# Patient Record
Sex: Male | Born: 1960 | Race: Black or African American | Hispanic: No | Marital: Married | Smoking: Former smoker
Health system: Southern US, Community
[De-identification: ages and names within clinical notes are randomized; demographics above are authoritative.]

## PROBLEM LIST (undated history)

## (undated) DIAGNOSIS — F419 Anxiety disorder, unspecified: Secondary | ICD-10-CM

## (undated) DIAGNOSIS — I10 Essential (primary) hypertension: Secondary | ICD-10-CM

## (undated) DIAGNOSIS — D849 Immunodeficiency, unspecified: Secondary | ICD-10-CM

## (undated) DIAGNOSIS — N289 Disorder of kidney and ureter, unspecified: Secondary | ICD-10-CM

## (undated) DIAGNOSIS — B1910 Unspecified viral hepatitis B without hepatic coma: Secondary | ICD-10-CM

## (undated) DIAGNOSIS — B20 Human immunodeficiency virus [HIV] disease: Secondary | ICD-10-CM

## (undated) DIAGNOSIS — L03113 Cellulitis of right upper limb: Secondary | ICD-10-CM

## (undated) DIAGNOSIS — D649 Anemia, unspecified: Secondary | ICD-10-CM

## (undated) HISTORY — PX: HERNIA REPAIR: SHX51

## (undated) HISTORY — DX: Anxiety disorder, unspecified: F41.9

## (undated) HISTORY — DX: Anemia, unspecified: D64.9

---

## 1999-06-13 ENCOUNTER — Emergency Department (HOSPITAL_COMMUNITY): Admission: EM | Admit: 1999-06-13 | Discharge: 1999-06-13 | Payer: Self-pay

## 1999-06-14 ENCOUNTER — Encounter: Payer: Self-pay | Admitting: Emergency Medicine

## 2000-12-11 ENCOUNTER — Encounter: Payer: Self-pay | Admitting: Family Medicine

## 2000-12-11 ENCOUNTER — Ambulatory Visit (HOSPITAL_COMMUNITY): Admission: RE | Admit: 2000-12-11 | Discharge: 2000-12-11 | Payer: Self-pay | Admitting: Family Medicine

## 2003-08-25 ENCOUNTER — Emergency Department (HOSPITAL_COMMUNITY): Admission: EM | Admit: 2003-08-25 | Discharge: 2003-08-25 | Payer: Self-pay | Admitting: Emergency Medicine

## 2003-08-25 ENCOUNTER — Encounter: Payer: Self-pay | Admitting: Emergency Medicine

## 2005-01-22 ENCOUNTER — Ambulatory Visit: Payer: Self-pay | Admitting: Infectious Diseases

## 2005-01-22 ENCOUNTER — Encounter (INDEPENDENT_AMBULATORY_CARE_PROVIDER_SITE_OTHER): Payer: Self-pay | Admitting: *Deleted

## 2005-01-22 ENCOUNTER — Ambulatory Visit (HOSPITAL_COMMUNITY): Admission: RE | Admit: 2005-01-22 | Discharge: 2005-01-22 | Payer: Self-pay | Admitting: Infectious Diseases

## 2005-01-22 LAB — CONVERTED CEMR LAB
CD4 Count: 620 microliters
CD4 T Cell Abs: 620
HIV 1 RNA Quant: 9030 copies/mL

## 2005-03-06 ENCOUNTER — Ambulatory Visit: Payer: Self-pay | Admitting: Infectious Diseases

## 2005-05-22 ENCOUNTER — Ambulatory Visit: Payer: Self-pay | Admitting: Infectious Diseases

## 2005-05-22 ENCOUNTER — Encounter (INDEPENDENT_AMBULATORY_CARE_PROVIDER_SITE_OTHER): Payer: Self-pay | Admitting: *Deleted

## 2005-05-22 ENCOUNTER — Ambulatory Visit (HOSPITAL_COMMUNITY): Admission: RE | Admit: 2005-05-22 | Discharge: 2005-05-22 | Payer: Self-pay | Admitting: Infectious Diseases

## 2005-05-22 LAB — CONVERTED CEMR LAB: CD4 Count: 810 microliters

## 2005-06-05 ENCOUNTER — Ambulatory Visit: Payer: Self-pay | Admitting: Infectious Diseases

## 2007-01-11 ENCOUNTER — Encounter (INDEPENDENT_AMBULATORY_CARE_PROVIDER_SITE_OTHER): Payer: Self-pay | Admitting: *Deleted

## 2007-01-11 LAB — CONVERTED CEMR LAB

## 2007-01-24 ENCOUNTER — Encounter (INDEPENDENT_AMBULATORY_CARE_PROVIDER_SITE_OTHER): Payer: Self-pay | Admitting: *Deleted

## 2008-12-28 ENCOUNTER — Ambulatory Visit: Payer: Self-pay | Admitting: Internal Medicine

## 2008-12-28 DIAGNOSIS — B20 Human immunodeficiency virus [HIV] disease: Secondary | ICD-10-CM | POA: Insufficient documentation

## 2008-12-28 LAB — CONVERTED CEMR LAB
Albumin: 3.6 g/dL (ref 3.5–5.2)
CO2: 25 meq/L (ref 19–32)
Calcium: 9.3 mg/dL (ref 8.4–10.5)
Chloride: 103 meq/L (ref 96–112)
GC Probe Amp, Urine: NEGATIVE
Glucose, Bld: 103 mg/dL — ABNORMAL HIGH (ref 70–99)
HIV 1 RNA Quant: 76200 copies/mL — ABNORMAL HIGH (ref <48)
Lymphocytes Relative: 44 % (ref 12–46)
Lymphs Abs: 1.8 10*3/uL (ref 0.7–4.0)
Neutrophils Relative %: 35 % — ABNORMAL LOW (ref 43–77)
Platelets: 251 10*3/uL (ref 150–400)
Potassium: 4.5 meq/L (ref 3.5–5.3)
RBC: 4.7 M/uL (ref 4.22–5.81)
Sodium: 139 meq/L (ref 135–145)
Total Protein: 8.6 g/dL — ABNORMAL HIGH (ref 6.0–8.3)
WBC: 4.1 10*3/uL (ref 4.0–10.5)

## 2008-12-29 ENCOUNTER — Encounter: Payer: Self-pay | Admitting: Internal Medicine

## 2009-01-12 ENCOUNTER — Ambulatory Visit: Payer: Self-pay | Admitting: Internal Medicine

## 2009-04-18 ENCOUNTER — Ambulatory Visit: Payer: Self-pay | Admitting: Internal Medicine

## 2009-04-18 LAB — CONVERTED CEMR LAB
Albumin: 3.8 g/dL (ref 3.5–5.2)
Alkaline Phosphatase: 90 units/L (ref 39–117)
BUN: 8 mg/dL (ref 6–23)
Eosinophils Relative: 3 % (ref 0–5)
GFR calc non Af Amer: >60 mL/min (ref >60)
Glucose, Bld: 101 mg/dL — ABNORMAL HIGH (ref 70–99)
HCT: 37 % — ABNORMAL LOW (ref 39.0–52.0)
Lymphocytes Relative: 24 % (ref 12–46)
Lymphs Abs: 1.6 10*3/uL (ref 0.7–4.0)
Platelets: 271 10*3/uL (ref 150–400)
Potassium: 4.3 meq/L (ref 3.5–5.3)
Total Bilirubin: 0.3 mg/dL (ref 0.3–1.2)
WBC: 6.5 10*3/uL (ref 4.0–10.5)

## 2009-04-19 ENCOUNTER — Encounter: Payer: Self-pay | Admitting: Internal Medicine

## 2009-04-19 LAB — CONVERTED CEMR LAB
HIV 1 RNA Quant: 48300 copies/mL — ABNORMAL HIGH (ref <48)
HIV-1 RNA Quant, Log: 4.68 — ABNORMAL HIGH (ref <1.68)

## 2009-05-02 ENCOUNTER — Encounter (INDEPENDENT_AMBULATORY_CARE_PROVIDER_SITE_OTHER): Payer: Self-pay | Admitting: *Deleted

## 2010-01-10 ENCOUNTER — Ambulatory Visit: Payer: Self-pay | Admitting: Internal Medicine

## 2010-01-10 ENCOUNTER — Encounter (INDEPENDENT_AMBULATORY_CARE_PROVIDER_SITE_OTHER): Payer: Self-pay | Admitting: Licensed Clinical Social Worker

## 2010-01-10 LAB — CONVERTED CEMR LAB
BUN: 9 mg/dL (ref 6–23)
CO2: 27 meq/L (ref 19–32)
Creatinine, Ser: 0.9 mg/dL (ref 0.40–1.50)
Glucose, Bld: 104 mg/dL — ABNORMAL HIGH (ref 70–99)
HIV 1 RNA Quant: 29900 copies/mL — ABNORMAL HIGH (ref <48)
HIV-1 RNA Quant, Log: 4.48 — ABNORMAL HIGH (ref <1.68)
Lymphocytes Relative: 39 % (ref 12–46)
Lymphs Abs: 1.4 10*3/uL (ref 0.7–4.0)
Neutro Abs: 1.2 10*3/uL — ABNORMAL LOW (ref 1.7–7.7)
Neutrophils Relative %: 33 % — ABNORMAL LOW (ref 43–77)
Platelets: 223 10*3/uL (ref 150–400)
Potassium: 4.3 meq/L (ref 3.5–5.3)
Total Bilirubin: 0.3 mg/dL (ref 0.3–1.2)
Total Protein: 8.3 g/dL (ref 6.0–8.3)
WBC: 3.7 10*3/uL — ABNORMAL LOW (ref 4.0–10.5)

## 2010-04-30 ENCOUNTER — Ambulatory Visit: Payer: Self-pay | Admitting: Internal Medicine

## 2010-04-30 LAB — CONVERTED CEMR LAB
AST: 49 units/L — ABNORMAL HIGH (ref 0–37)
Albumin: 3.7 g/dL (ref 3.5–5.2)
Alkaline Phosphatase: 77 units/L (ref 39–117)
Basophils Absolute: 0 10*3/uL (ref 0.0–0.1)
Chloride: 105 meq/L (ref 96–112)
Glucose, Bld: 88 mg/dL (ref 70–99)
Hemoglobin: 12.9 g/dL — ABNORMAL LOW (ref 13.0–17.0)
Lymphocytes Relative: 34 % (ref 12–46)
Lymphs Abs: 1.6 10*3/uL (ref 0.7–4.0)
Monocytes Absolute: 0.7 10*3/uL (ref 0.1–1.0)
Neutro Abs: 1.8 10*3/uL (ref 1.7–7.7)
Potassium: 4.2 meq/L (ref 3.5–5.3)
RDW: 15 % (ref 11.5–15.5)
Sodium: 139 meq/L (ref 135–145)
Total Protein: 9.1 g/dL — ABNORMAL HIGH (ref 6.0–8.3)
WBC: 4.6 10*3/uL (ref 4.0–10.5)

## 2010-06-12 ENCOUNTER — Ambulatory Visit: Payer: Self-pay | Admitting: Internal Medicine

## 2010-06-12 ENCOUNTER — Encounter (INDEPENDENT_AMBULATORY_CARE_PROVIDER_SITE_OTHER): Payer: Self-pay | Admitting: *Deleted

## 2010-06-12 DIAGNOSIS — R030 Elevated blood-pressure reading, without diagnosis of hypertension: Secondary | ICD-10-CM

## 2010-06-27 ENCOUNTER — Encounter: Payer: Self-pay | Admitting: Internal Medicine

## 2010-07-04 ENCOUNTER — Telehealth (INDEPENDENT_AMBULATORY_CARE_PROVIDER_SITE_OTHER): Payer: Self-pay | Admitting: *Deleted

## 2010-07-05 ENCOUNTER — Telehealth (INDEPENDENT_AMBULATORY_CARE_PROVIDER_SITE_OTHER): Payer: Self-pay | Admitting: *Deleted

## 2010-07-26 ENCOUNTER — Telehealth: Payer: Self-pay | Admitting: Internal Medicine

## 2010-09-16 ENCOUNTER — Ambulatory Visit: Payer: Self-pay | Admitting: Internal Medicine

## 2010-09-16 LAB — CONVERTED CEMR LAB
BUN: 11 mg/dL (ref 6–23)
CO2: 23 meq/L (ref 19–32)
Creatinine, Ser: 0.88 mg/dL (ref 0.40–1.50)
Eosinophils Absolute: 0.8 10*3/uL — ABNORMAL HIGH (ref 0.0–0.7)
Eosinophils Relative: 17 % — ABNORMAL HIGH (ref 0–5)
Glucose, Bld: 101 mg/dL — ABNORMAL HIGH (ref 70–99)
HCT: 38 % — ABNORMAL LOW (ref 39.0–52.0)
HIV-1 RNA Quant, Log: 1.4 — ABNORMAL HIGH (ref <1.30)
Hemoglobin: 12.8 g/dL — ABNORMAL LOW (ref 13.0–17.0)
LDL Cholesterol: 93 mg/dL (ref 0–99)
Lymphs Abs: 1.6 10*3/uL (ref 0.7–4.0)
MCV: 84.8 fL (ref 78.0–100.0)
Monocytes Relative: 10 % (ref 3–12)
Neutrophils Relative %: 39 % — ABNORMAL LOW (ref 43–77)
RBC: 4.48 M/uL (ref 4.22–5.81)
Total Bilirubin: 0.3 mg/dL (ref 0.3–1.2)
Triglycerides: 57 mg/dL (ref <150)
VLDL: 11 mg/dL (ref 0–40)
WBC: 4.8 10*3/uL (ref 4.0–10.5)

## 2010-09-30 ENCOUNTER — Encounter (INDEPENDENT_AMBULATORY_CARE_PROVIDER_SITE_OTHER): Payer: Self-pay | Admitting: *Deleted

## 2010-10-01 ENCOUNTER — Encounter: Payer: Self-pay | Admitting: Internal Medicine

## 2010-10-07 ENCOUNTER — Encounter (INDEPENDENT_AMBULATORY_CARE_PROVIDER_SITE_OTHER): Payer: Self-pay | Admitting: *Deleted

## 2010-10-17 ENCOUNTER — Encounter: Payer: Self-pay | Admitting: Internal Medicine

## 2010-12-19 NOTE — Miscellaneous (Signed)
Summary: clinical update/ryan white NCADAP apprv 02/15/11  Clinical Lists Changes  Medications: Rx of BACTRIM DS 800-160 MG TABS (SULFAMETHOXAZOLE-TRIMETHOPRIM) Take 1 tablet by mouth once a day;  #30 x 5;  Signed;  Entered by: Paulo Fruit  BS,CPht II,MPH;  Authorized by: Yisroel Ramming MD;  Method used: Electronically to [facility] [phone]*, [address], [place], [place]  [address], Ph: [phone], Fax:  Rx of AZITHROMYCIN 600 MG TABS (AZITHROMYCIN) take 2 tablets once a week;  #8 x 5;  Signed;  Entered by: Paulo Fruit  BS,CPht II,MPH;  Authorized by: Yisroel Ramming MD;  Method used: Electronically to [facility] [phone]*, [address], [place], [place]  [address], Ph: [phone], Fax:  Rx of ATRIPLA 600-200-300 MG TABS (EFAVIRENZ-EMTRICITAB-TENOFOVIR) Take 1 tablet by mouth at bedtime;  #30 x 5;  Signed;  Entered by: Paulo Fruit  BS,CPht II,MPH;  Authorized by: Yisroel Ramming MD;  Method used: Electronically to [facility] [phone]*, [address] [place], [place]  [address], Ph: [phone], Fax: Observations: Added new observation of AIDSDAP: Yes 2011 (06/27/2010 11:10)    Prescriptions: ATRIPLA 600-200-300 MG TABS (EFAVIRENZ-EMTRICITAB-TENOFOVIR) Take 1 tablet by mouth at bedtime  #30 x 5   Entered by:   Paulo Fruit  BS,CPht II,MPH   Authorized by:   Yisroel Ramming MD   Signed by:   Paulo Fruit  BS,CPht II,MPH on 06/27/2010   Method used:   Electronically to        [facility] [phone]* (retail)       [address]       [place], [place]  [address]       Ph: [phone]       Fax:    RxID:   [id number] AZITHROMYCIN 600 MG TABS (AZITHROMYCIN) take 2 tablets once a week  #8 x 5   Entered by:   Paulo Fruit  BS,CPht II,MPH   Authorized by:   Yisroel Ramming MD   Signed by:   Paulo Fruit  BS,CPht II,MPH on 06/27/2010   Method used:   Electronically to        [facility] [phone]* (retail)       [address]       [place], [place]  [address]       Ph: [phone]       Fax:    RxID:   [id number] BACTRIM DS  800-160 MG TABS (SULFAMETHOXAZOLE-TRIMETHOPRIM) Take 1 tablet by mouth once a day  #30 x 5   Entered by:   Paulo Fruit  BS,CPht II,MPH   Authorized by:   Yisroel Ramming MD   Signed by:   Paulo Fruit  BS,CPht II,MPH on 06/27/2010   Method used:   Electronically to        [facility] [phone]* (retail)       [address]       [place], [place]  [address]       Ph: [phone]       Fax:    RxID:   [id number]  Paulo Fruit  BS,CPht II,MPH  June 27, 2010 11:11 AM

## 2010-12-19 NOTE — Miscellaneous (Signed)
Summary: HCP Continuous  HCP Continuous   Imported By: Florinda Marker 10/18/2010 16:56:02  _____________________________________________________________________  External Attachment:    Type:   Image     Comment:   External Document

## 2010-12-19 NOTE — Miscellaneous (Signed)
  Clinical Lists Changes  Observations: Added new observation of YEARAIDSPOS: 2010  (10/07/2010 11:13)

## 2010-12-19 NOTE — Miscellaneous (Signed)
Summary: RW update  Clinical Lists Changes  Observations: Added new observation of INFECTDIS MD: Philipp Deputy (09/30/2010 11:54) Added new observation of TB TX STATUS: Completed (09/30/2010 11:54)

## 2010-12-19 NOTE — Miscellaneous (Signed)
Summary: Orders Update  Clinical Lists Changes  Orders: Added new Test order of T-CBC w/Diff [phone]) - Signed Added new Test order of T-CD4SP [facility]) (CD4SP) - Signed Added new Test order of T-Comprehensive Metabolic Panel [phone]) - Signed Added new Test order of T-HIV Viral Load [id number]) - Signed     Process Orders Check Orders Results:     [facility]: ABN not required for this insurance Order queued for requisitioning for [facility]: January 10, 2010 2:03 PM  Tests Sent for requisitioning (January 10, 2010 2:03 PM):     01/10/2010: [facility] -- T-CBC w/Diff [[id number]] (signed)     01/10/2010: [facility] -- T-Comprehensive Metabolic Panel [[id number]] (signed)     01/10/2010: [facility] -- T-HIV Viral Load [phone] (signed)

## 2010-12-19 NOTE — Miscellaneous (Signed)
Summary: Homecare Providers  Homecare Providers   Imported By: Florinda Marker 10/01/2010 15:37:38  _____________________________________________________________________  External Attachment:    Type:   Image     Comment:   External Document

## 2010-12-19 NOTE — Miscellaneous (Signed)
Summary: clinical update/ryan white  Clinical Lists Changes  Observations: Added new observation of PAYOR: No Insurance (06/12/2010 10:54) Added new observation of AIDSDAP: Pending (06/12/2010 10:54) Added new observation of HOUSEINCOME: 0  (06/12/2010 10:54) Added new observation of FINASSESSDT: 06/12/2010  (06/12/2010 10:54) Added new observation of REC_MESSAGE: No  (06/12/2010 10:54) Added new observation of RECPHONECALL: No  (06/12/2010 10:54) Added new observation of RW VITAL STA: Active  (06/12/2010 10:54)

## 2010-12-19 NOTE — Letter (Signed)
Summary: Kenneth Rivas: Income Verification  Kenneth Rivas: Income Verification   Imported By: Florinda Marker 06/19/2010 09:19:08  _____________________________________________________________________  External Attachment:    Type:   Image     Comment:   External Document

## 2010-12-19 NOTE — Progress Notes (Signed)
Summary: Homecare nurse could not reach pt.  Phone Note Other Incoming   Caller: Rhonda Summary of Call: Bjorn Loser went by to drop off Ariston's medications, however he was not home.  she said if he should call to let him know that she will be coming by his home on Tuesday and will bring them to him.  she is out of the office on Monday. Initial call taken by: Paulo Fruit  BS,CPht II,MPH,  July 05, 2010 4:52 PM

## 2010-12-19 NOTE — Assessment & Plan Note (Signed)
Summary: F/U/OV/VS   CC:  Kenneth Rivas. to reestablish, and lab results, and c/o dizziness at times and fingesr cramping.  History of Present Illness: Kenneth Rivas here to re-establish.  He has not taken  his Atripla that I tried to start him on over a year ago. He c/o occasionally feeling dizzy when he stands up.  Preventive Screening-Counseling & Management  Alcohol-Tobacco     Alcohol drinks/day: >5     Alcohol type: beer-6-12 pack a day     Smoking Status: current     Packs/Day: 2 cigs per month     Passive Smoke Exposure: yes  Caffeine-Diet-Exercise     Caffeine use/day: coffee and soda 2 per day     Does Patient Exercise: yes     Type of exercise: walking     Times/week: 7  Safety-Violence-Falls     Seat Belt Use: 100      Drug Use:  former and crack cocaine.     Current Allergies (reviewed today): No known allergies  Social History: Drug Use:  former, crack cocaine  Review of Systems  The patient denies anorexia, fever, and weight loss.    Vital Signs:  Patient profile:   50 year old male Height:      66 inches (167.64 cm) Weight:      142.8 pounds (64.91 kg) BMI:     23.13 Temp:     98.0 degrees F (36.67 degrees C) oral Pulse rate:   80 / minute BP sitting:   148 / 88  (right arm)  Vitals Entered By: Wendall Mola CMA Duncan Dull) (June 12, 2010 9:43 AM) CC: Kenneth Rivas. to reestablish, and lab results, c/o dizziness at times and fingesr cramping Is Patient Diabetic? No Pain Assessment Patient in pain? no      Nutritional Status BMI of 19 -24 = normal Nutritional Status Detail appetite "good"  Have you ever been in a relationship where you felt threatened, hurt or afraid?No   Does patient need assistance? Functional Status Self care Ambulation Normal Comments Kenneth Rivas. has been off meds for three years   Physical Exam  General:  alert, well-developed, well-nourished, and well-hydrated.   Head:  normocephalic and atraumatic.   Mouth:  pharynx pink and moist.   Lungs:   normal breath sounds.      Impression & Recommendations:  Problem # 1:  HIV INFECTION (ICD-042) Discussed need to start HIV meds. Kenneth Rivas agrees to start.  last genotype did hot show any resistance.  Will start Atripla. Potential sde effects discussed. I emphasized the need to take his medication every day.  I referred him to Byrd Hesselbach to get enrolled in ADAP.  I started him on PCP prophylaxis and MAC prophylaxis.  He will return in 6 weeks for repeat labs.  Once on meds will refer to adherence counselor. The following medications were removed from the medication list:    Bactrim Ds 800-160 Mg Tabs (Sulfamethoxazole-trimethoprim) .Marland Kitchen... Take 1 tablet by mouth once a day His updated medication list for this problem includes:    Bactrim Ds 800-160 Mg Tabs (Sulfamethoxazole-trimethoprim) .Marland Kitchen... Take 1 tablet by mouth once a day    Azithromycin 600 Mg Tabs (Azithromycin) .Marland Kitchen... Take 2 tablets once a week  Diagnostics Reviewed:  HIV: CDC-defined AIDS (01/12/2009)   CD4: 30 (05/01/2010)   WBC: 4.6 (04/30/2010)   Hgb: 12.9 (04/30/2010)   HCT: 39.2 (04/30/2010)   Platelets: 241 (04/30/2010) HIV genotype: See Comment (01/12/2009)   HIV-1 RNA: 21400 (04/30/2010)   HBSAg: NO (  01/11/2007)  Orders: Est. Patient Level IV (99214)Future Orders: T-CD4SP ([facility]) (CD4SP) ... 07/24/2010 T-HIV Viral Load [id number]) ... 07/24/2010 T-Comprehensive Metabolic Panel [id number]) ... 07/24/2010 T-CBC w/Diff ([id number]) ... 07/24/2010 T-RPR (Syphilis) [id number]) ... 07/24/2010 T-Lipid Profile [id number]) ... 07/24/2010  Problem # 2:  ELEVATED BLOOD PRESSURE (ICD-796.2) discussed diet and exercise will re-check at next visit  Medications Added to Medication List This Visit: 1)  Bactrim Ds 800-160 Mg Tabs (Sulfamethoxazole-trimethoprim) .... Take 1 tablet by mouth once a day 2)  Azithromycin 600 Mg Tabs (Azithromycin) .... Take 2 tablets once a week 3)  Atripla 600-200-300 Mg Tabs  (Efavirenz-emtricitab-tenofovir) .... Take 1 tablet by mouth at bedtime  Patient Instructions: 1)  Please schedule a follow-up appointment in 8 weeks, 2 weeks after labs.  Prescriptions: ATRIPLA 600-200-300 MG TABS (EFAVIRENZ-EMTRICITAB-TENOFOVIR) Take 1 tablet by mouth at bedtime  #30 x 5   Entered and Authorized by:   Yisroel Ramming MD   Signed by:   Yisroel Ramming MD on 06/12/2010   Method used:   Print then Give to Patient   RxID:   [id number] AZITHROMYCIN 600 MG TABS (AZITHROMYCIN) take 2 tablets once a week  #10 x 5   Entered and Authorized by:   Yisroel Ramming MD   Signed by:   Yisroel Ramming MD on 06/12/2010   Method used:   Print then Give to Patient   RxID:   [id number] BACTRIM DS 800-160 MG TABS (SULFAMETHOXAZOLE-TRIMETHOPRIM) Take 1 tablet by mouth once a day  #30 x 5   Entered and Authorized by:   Yisroel Ramming MD   Signed by:   Yisroel Ramming MD on 06/12/2010   Method used:   Print then Give to Patient   RxID:   [id number]

## 2010-12-19 NOTE — Progress Notes (Signed)
Summary: ncadap meds arrived for Sept.--Rx holding meds  Phone Note Refill Request      Prescriptions: AZITHROMYCIN 600 MG TABS (AZITHROMYCIN) take 2 tablets once a week  #8 x 0   Entered by:   Paulo Fruit  BS,CPht II,MPH   Authorized by:   Yisroel Ramming MD   Signed by:   Paulo Fruit  BS,CPht II,MPH on 07/26/2010   Method used:   Samples Given   RxID:   [id number] BACTRIM DS 800-160 MG TABS (SULFAMETHOXAZOLE-TRIMETHOPRIM) Take 1 tablet by mouth once a day  #30 x 0   Entered by:   Paulo Fruit  BS,CPht II,MPH   Authorized by:   Yisroel Ramming MD   Signed by:   Paulo Fruit  BS,CPht II,MPH on 07/26/2010   Method used:   Samples Given   RxID:   [id number]  I spoke to Bluford Kaufmann the homehealth nurse who stated that patient is incarcerated at this time.  She said to place patient's medications on hold at [facility] because he is receiving everything he needs while there.  Patient Assist Medication Verification: Medication name:azithromycin 600mg  RX # [id number] Tech approval:MLD  Patient Assist Medication Verification: Medication name:Sulfameth/trimethoprim 800/160mg  RX # [id number] Tech approval:MLD\ Paulo Fruit  BS,CPht II,MPH  July 26, 2010 3:53 PM

## 2010-12-19 NOTE — Progress Notes (Signed)
Summary: ncadap meds arrived for Aug--msg left w/homecare provdier nurse  Phone Note Refill Request      Prescriptions: ATRIPLA 600-200-300 MG TABS (EFAVIRENZ-EMTRICITAB-TENOFOVIR) Take 1 tablet by mouth at bedtime  #30 x 0   Entered by:   Paulo Fruit  BS,CPht II,MPH   Authorized by:   Yisroel Ramming MD   Signed by:   Paulo Fruit  BS,CPht II,MPH on 07/04/2010   Method used:   Samples Given   RxID:   [id number] AZITHROMYCIN 600 MG TABS (AZITHROMYCIN) take 2 tablets once a week  #8 x 0   Entered by:   Paulo Fruit  BS,CPht II,MPH   Authorized by:   Yisroel Ramming MD   Signed by:   Paulo Fruit  BS,CPht II,MPH on 07/04/2010   Method used:   Samples Given   RxID:   [id number] BACTRIM DS 800-160 MG TABS (SULFAMETHOXAZOLE-TRIMETHOPRIM) Take 1 tablet by mouth once a day  #30 x 0   Entered by:   Paulo Fruit  BS,CPht II,MPH   Authorized by:   Yisroel Ramming MD   Signed by:   Paulo Fruit  BS,CPht II,MPH on 07/04/2010   Method used:   Samples Given   RxID:   [id number]  Patient Assist Medication Verification: Medication name:Azithromycin 600mg  RX # [id number] Tech approval:MLD  Patient Assist Medication Verification: Medication name:Atripla RX # [id number] Tech approval:MLD  Patient Assist Medication Verification: Medication name:sulfameth/trimethoprim 800/160mg  RX # [id number] Tech approval:MLD  Left message on Janann August, RN Medical Case Manager with homecare providers that Quince's medications are ready to be picked up.  I spoke with her earlier in the week and told her it would come for Friday 07/05/10 if she wanted to pick up for him since he has no phone. Paulo Fruit  BS,CPht II,MPH  July 04, 2010 3:00 PM  Appended Document: ncadap meds arrived for Aug--msg left w/homecare provdier nurse Bjorn Loser came to pick his meds today.

## 2010-12-19 NOTE — Miscellaneous (Signed)
Summary: Orders Update  Clinical Lists Changes  Orders: Added new Test order of T-CBC w/Diff [phone]) - Signed Added new Test order of T-CD4SP [facility]) (CD4SP) - Signed Added new Test order of T-HIV Viral Load [id number]) - Signed Added new Test order of T-Comprehensive Metabolic Panel ([id number]) - Signed     Process Orders Check Orders Results:     [facility]: ABN not required for this insurance Tests Sent for requisitioning (April 30, 2010 4:01 PM):     04/30/2010: [facility] -- T-CBC w/Diff [[id number]] (signed)     04/30/2010: [facility] -- T-HIV Viral Load [id number] (signed)     04/30/2010: [facility] -- T-Comprehensive Metabolic Panel [id number] (signed)

## 2011-01-08 ENCOUNTER — Other Ambulatory Visit (INDEPENDENT_AMBULATORY_CARE_PROVIDER_SITE_OTHER): Payer: Self-pay

## 2011-01-08 ENCOUNTER — Other Ambulatory Visit: Payer: Self-pay | Admitting: Adult Health

## 2011-01-08 ENCOUNTER — Encounter: Payer: Self-pay | Admitting: Adult Health

## 2011-01-08 DIAGNOSIS — B20 Human immunodeficiency virus [HIV] disease: Secondary | ICD-10-CM

## 2011-01-08 LAB — CONVERTED CEMR LAB
Albumin: 3.9 g/dL (ref 3.5–5.2)
Alkaline Phosphatase: 88 units/L (ref 39–117)
BUN: 11 mg/dL (ref 6–23)
Calcium: 9.1 mg/dL (ref 8.4–10.5)
Glucose, Bld: 107 mg/dL — ABNORMAL HIGH (ref 70–99)
Hemoglobin: 12.8 g/dL — ABNORMAL LOW (ref 13.0–17.0)
Lymphocytes Relative: 33 % (ref 12–46)
Lymphs Abs: 1.2 10*3/uL (ref 0.7–4.0)
MCHC: 32.7 g/dL (ref 30.0–36.0)
Monocytes Absolute: 0.3 10*3/uL (ref 0.1–1.0)
Monocytes Relative: 10 % (ref 3–12)
Neutro Abs: 1.6 10*3/uL — ABNORMAL LOW (ref 1.7–7.7)
Potassium: 4.2 meq/L (ref 3.5–5.3)
RBC: 4.54 M/uL (ref 4.22–5.81)
WBC: 3.5 10*3/uL — ABNORMAL LOW (ref 4.0–10.5)

## 2011-01-21 ENCOUNTER — Telehealth: Payer: Self-pay | Admitting: *Deleted

## 2011-01-23 ENCOUNTER — Ambulatory Visit: Payer: Self-pay | Admitting: Adult Health

## 2011-01-28 NOTE — Progress Notes (Signed)
  Phone Note From Pharmacy   Summary of Call: [facility] ( [phone] [phone]) called about his aziththomycin. they did not have the most recent refill from 11/11. gave it to her & asked that they fill Initial call taken by: Golden Circle RN,  January 21, 2011 1:44 PM

## 2011-01-29 LAB — T-HELPER CELL (CD4) - (RCID CLINIC ONLY)
CD4 % Helper T Cell: 5 % — ABNORMAL LOW (ref 33–55)
CD4 T Cell Abs: 70 uL — ABNORMAL LOW (ref 400–2700)

## 2011-02-03 LAB — T-HELPER CELL (CD4) - (RCID CLINIC ONLY)
CD4 % Helper T Cell: 2 % — ABNORMAL LOW (ref 33–55)
CD4 T Cell Abs: 30 uL — ABNORMAL LOW (ref 400–2700)

## 2011-02-05 LAB — T-HELPER CELL (CD4) - (RCID CLINIC ONLY): CD4 T Cell Abs: 40 uL — ABNORMAL LOW (ref 400–2700)

## 2011-02-13 ENCOUNTER — Encounter: Payer: Self-pay | Admitting: Adult Health

## 2011-02-13 ENCOUNTER — Other Ambulatory Visit: Payer: Self-pay | Admitting: *Deleted

## 2011-02-13 DIAGNOSIS — B20 Human immunodeficiency virus [HIV] disease: Secondary | ICD-10-CM

## 2011-02-13 MED ORDER — EFAVIRENZ-EMTRICITAB-TENOFOVIR 600-200-300 MG PO TABS: 1.0000 | ORAL_TABLET | Freq: Every day | ORAL | Status: DC

## 2011-02-18 ENCOUNTER — Other Ambulatory Visit: Payer: Self-pay | Admitting: Licensed Clinical Social Worker

## 2011-02-18 DIAGNOSIS — B20 Human immunodeficiency virus [HIV] disease: Secondary | ICD-10-CM

## 2011-02-18 MED ORDER — EFAVIRENZ-EMTRICITAB-TENOFOVIR 600-200-300 MG PO TABS: 1.0000 | ORAL_TABLET | Freq: Every day | ORAL | Status: DC

## 2011-02-19 ENCOUNTER — Other Ambulatory Visit: Payer: Self-pay

## 2011-02-19 DIAGNOSIS — B20 Human immunodeficiency virus [HIV] disease: Secondary | ICD-10-CM

## 2011-02-19 MED ORDER — SULFAMETHOXAZOLE-TMP DS 800-160 MG PO TABS: 1.0000 | ORAL_TABLET | Freq: Every day | ORAL | Status: DC

## 2011-02-19 MED ORDER — AZITHROMYCIN 600 MG PO TABS: 1200.0000 mg | ORAL_TABLET | ORAL | Status: DC

## 2011-02-19 MED ORDER — EFAVIRENZ-EMTRICITAB-TENOFOVIR 600-200-300 MG PO TABS: 1.0000 | ORAL_TABLET | Freq: Every day | ORAL | Status: DC

## 2011-02-20 ENCOUNTER — Other Ambulatory Visit: Payer: Self-pay | Admitting: Licensed Clinical Social Worker

## 2011-02-20 DIAGNOSIS — B20 Human immunodeficiency virus [HIV] disease: Secondary | ICD-10-CM

## 2011-02-20 MED ORDER — SULFAMETHOXAZOLE-TMP DS 800-160 MG PO TABS: 1.0000 | ORAL_TABLET | Freq: Every day | ORAL | Status: DC

## 2011-02-24 ENCOUNTER — Other Ambulatory Visit: Payer: Self-pay | Admitting: *Deleted

## 2011-02-24 DIAGNOSIS — B20 Human immunodeficiency virus [HIV] disease: Secondary | ICD-10-CM

## 2011-02-24 LAB — T-HELPER CELL (CD4) - (RCID CLINIC ONLY)
CD4 % Helper T Cell: 7 % — ABNORMAL LOW (ref 33–55)
CD4 T Cell Abs: 100 uL — ABNORMAL LOW (ref 400–2700)

## 2011-02-24 MED ORDER — AZITHROMYCIN 600 MG PO TABS: 1200.0000 mg | ORAL_TABLET | ORAL | Status: DC

## 2011-03-04 LAB — T-HELPER CELL (CD4) - (RCID CLINIC ONLY): CD4 T Cell Abs: 130 uL — ABNORMAL LOW (ref 400–2700)

## 2011-03-27 ENCOUNTER — Ambulatory Visit (INDEPENDENT_AMBULATORY_CARE_PROVIDER_SITE_OTHER): Payer: Self-pay

## 2011-03-27 ENCOUNTER — Other Ambulatory Visit: Payer: Self-pay

## 2011-03-27 ENCOUNTER — Other Ambulatory Visit: Payer: Self-pay | Admitting: Infectious Diseases

## 2011-03-27 ENCOUNTER — Other Ambulatory Visit: Payer: Self-pay | Admitting: Licensed Clinical Social Worker

## 2011-03-27 ENCOUNTER — Other Ambulatory Visit: Payer: Self-pay | Admitting: Adult Health

## 2011-03-27 DIAGNOSIS — B20 Human immunodeficiency virus [HIV] disease: Secondary | ICD-10-CM

## 2011-03-28 LAB — COMPLETE METABOLIC PANEL WITH GFR
Alkaline Phosphatase: 86 U/L (ref 39–117)
BUN: 12 mg/dL (ref 6–23)
GFR, Est Non African American: >60 mL/min (ref >60)
Glucose, Bld: 87 mg/dL (ref 70–99)
Sodium: 138 mEq/L (ref 135–145)
Total Bilirubin: 0.3 mg/dL (ref 0.3–1.2)
Total Protein: 8.4 g/dL — ABNORMAL HIGH (ref 6.0–8.3)

## 2011-03-28 LAB — CBC WITH DIFFERENTIAL/PLATELET
Basophils Relative: 1 % (ref 0–1)
Eosinophils Absolute: 0.6 10*3/uL (ref 0.0–0.7)
Hemoglobin: 13 g/dL (ref 13.0–17.0)
MCH: 27.3 pg (ref 26.0–34.0)
MCHC: 31.9 g/dL (ref 30.0–36.0)
Monocytes Relative: 13 % — ABNORMAL HIGH (ref 3–12)
Neutrophils Relative %: 19 % — ABNORMAL LOW (ref 43–77)
Platelets: 211 10*3/uL (ref 150–400)

## 2011-03-28 LAB — PATHOLOGIST SMEAR REVIEW

## 2011-03-28 LAB — RPR

## 2011-03-28 LAB — T-HELPER CELL (CD4) - (RCID CLINIC ONLY): CD4 T Cell Abs: 100 uL — ABNORMAL LOW (ref 400–2700)

## 2011-03-28 LAB — GC/CHLAMYDIA PROBE AMP, URINE
Chlamydia, Swab/Urine, PCR: NEGATIVE
GC Probe Amp, Urine: NEGATIVE

## 2011-03-31 LAB — HIV-1 RNA QUANT-NO REFLEX-BLD
HIV 1 RNA Quant: 25500 copies/mL — ABNORMAL HIGH (ref <20)
HIV-1 RNA Quant, Log: 4.41 {Log} — ABNORMAL HIGH (ref <1.30)

## 2011-04-09 ENCOUNTER — Other Ambulatory Visit: Payer: Self-pay | Admitting: Adult Health

## 2011-04-09 ENCOUNTER — Other Ambulatory Visit: Payer: Self-pay | Admitting: *Deleted

## 2011-04-09 DIAGNOSIS — B20 Human immunodeficiency virus [HIV] disease: Secondary | ICD-10-CM

## 2011-04-11 LAB — HIV-1 GENOTYPR PLUS

## 2011-04-17 ENCOUNTER — Ambulatory Visit: Payer: Self-pay | Admitting: Adult Health

## 2011-05-01 ENCOUNTER — Telehealth: Payer: Self-pay | Admitting: *Deleted

## 2011-05-01 NOTE — Telephone Encounter (Signed)
rec'd fax from [place] asking for labs & wt. Faxed to her

## 2011-08-14 ENCOUNTER — Other Ambulatory Visit: Payer: Self-pay | Admitting: *Deleted

## 2011-08-14 ENCOUNTER — Telehealth: Payer: Self-pay

## 2011-08-14 DIAGNOSIS — B20 Human immunodeficiency virus [HIV] disease: Secondary | ICD-10-CM

## 2011-08-14 MED ORDER — AZITHROMYCIN 600 MG PO TABS: 1200.0000 mg | ORAL_TABLET | ORAL | Status: DC

## 2011-08-14 NOTE — Telephone Encounter (Signed)
Tried to reach patient to renew ADAP was not in - will try and reach Bjorn Loser [place] to see if she renewed ADAP for patient.

## 2011-09-10 ENCOUNTER — Other Ambulatory Visit: Payer: Self-pay | Admitting: Infectious Disease

## 2011-09-10 DIAGNOSIS — Z79899 Other long term (current) drug therapy: Secondary | ICD-10-CM

## 2011-09-10 DIAGNOSIS — B20 Human immunodeficiency virus [HIV] disease: Secondary | ICD-10-CM

## 2011-09-11 ENCOUNTER — Other Ambulatory Visit: Payer: Self-pay

## 2011-09-29 ENCOUNTER — Ambulatory Visit: Payer: Self-pay

## 2011-09-29 ENCOUNTER — Ambulatory Visit: Payer: Self-pay | Admitting: Infectious Disease

## 2012-04-30 ENCOUNTER — Emergency Department (INDEPENDENT_AMBULATORY_CARE_PROVIDER_SITE_OTHER)
Admission: EM | Admit: 2012-04-30 | Discharge: 2012-04-30 | Disposition: A | Discharge status: Home / Self Care | Payer: Self-pay | Source: Home / Self Care | Attending: Emergency Medicine | Admitting: Emergency Medicine

## 2012-04-30 ENCOUNTER — Encounter (HOSPITAL_COMMUNITY): Payer: Self-pay

## 2012-04-30 DIAGNOSIS — L03818 Cellulitis of other sites: Secondary | ICD-10-CM

## 2012-04-30 DIAGNOSIS — B1910 Unspecified viral hepatitis B without hepatic coma: Secondary | ICD-10-CM | POA: Insufficient documentation

## 2012-04-30 DIAGNOSIS — Z23 Encounter for immunization: Secondary | ICD-10-CM

## 2012-04-30 DIAGNOSIS — B20 Human immunodeficiency virus [HIV] disease: Secondary | ICD-10-CM

## 2012-04-30 DIAGNOSIS — L02811 Cutaneous abscess of head [any part, except face]: Secondary | ICD-10-CM

## 2012-04-30 DIAGNOSIS — L02818 Cutaneous abscess of other sites: Secondary | ICD-10-CM

## 2012-04-30 HISTORY — DX: Human immunodeficiency virus (HIV) disease: B20

## 2012-04-30 HISTORY — DX: Unspecified viral hepatitis B without hepatic coma: B19.10

## 2012-04-30 HISTORY — DX: Immunodeficiency, unspecified: D84.9

## 2012-04-30 HISTORY — DX: Essential (primary) hypertension: I10

## 2012-04-30 MED ORDER — BACITRACIN 500 UNIT/GM EX OINT
1.0000 "application " | TOPICAL_OINTMENT | Freq: Once | CUTANEOUS | Status: AC
  Administered 2012-04-30: 1 via TOPICAL

## 2012-04-30 MED ORDER — SULFAMETHOXAZOLE-TRIMETHOPRIM 800-160 MG PO TABS: 1.0000 | ORAL_TABLET | Freq: Two times a day (BID) | ORAL | Status: DC

## 2012-04-30 MED ORDER — TETANUS-DIPHTH-ACELL PERTUSSIS 5-2.5-18.5 LF-MCG/0.5 IM SUSP
0.5000 mL | Freq: Once | INTRAMUSCULAR | Status: AC
  Administered 2012-04-30: 0.5 mL via INTRAMUSCULAR

## 2012-04-30 MED ORDER — CEPHALEXIN 500 MG PO CAPS: 500.0000 mg | ORAL_CAPSULE | Freq: Four times a day (QID) | ORAL | Status: AC

## 2012-04-30 MED ORDER — TETANUS-DIPHTH-ACELL PERTUSSIS 5-2.5-18.5 LF-MCG/0.5 IM SUSP
INTRAMUSCULAR | Status: AC
  Filled 2012-04-30: qty 0.5

## 2012-04-30 MED ORDER — LIDOCAINE-EPINEPHRINE 2 %-1:100000 IJ SOLN
5.0000 mL | Freq: Once | INTRAMUSCULAR | Status: AC
  Administered 2012-04-30: 5 mL

## 2012-04-30 NOTE — ED Provider Notes (Signed)
History     CSN: [id number]  Arrival date & time 04/30/12  1443   First MD Initiated Contact with Patient 04/30/12 1503      Chief Complaint  Patient presents with  . Skin Problem    (Consider location/radiation/quality/duration/timing/severity/associated sxs/prior treatment) HPI Comments: Patient history of HIV/AIDS reports having a painless, atraumatic mass on his left scalp for 2 months. States that it fluctuates in size, but has never gone away. Yesterday hit this area on a rusty animal cage. States that the mass got significantly larger, tender, and is now "soft in the middle". No drainage. Reports some localized pain/headache, but no nausea, vomiting, neck stiffness, fevers. He does not know what his las CD4 or viral load was, but he is on prophylactic azithromycin and Bactrim. Chart review shows a viral load 25,500, CD4 100 on 03/27/2011. He is an infectious disease clinic patient. He also does have a history of polysubstance abuse, last use of crack 3 weeks ago. Denies use of any other substances. He is a resident of [facility].  ROS as noted in HPI. All other ROS negative.   Patient is a 51 y.o. male presenting with abscess. The history is provided by the patient. No language interpreter was used.  Abscess  This is a new problem. The current episode started yesterday. The onset was sudden. The problem has been gradually worsening. The abscess is present on the scalp. The abscess is characterized by painfulness. The abscess first occurred at work.    Past Medical History  Diagnosis Date  . Hypertension   . Hepatitis B   . Immune deficiency disorder   . HIV disease   . AIDS     Past Surgical History  Procedure Date  . Hernia repair     History reviewed. No pertinent family history.  History  Substance Use Topics  . Smoking status: Former Smoker    Types: Cigarettes    Quit date: 05/01/2011  . Smokeless tobacco: Not on file  . Alcohol Use: No     quit 02/2011       Review of Systems  Allergies  Review of patient's allergies indicates no known allergies.  Home Medications   Current Outpatient Rx  Name Route Sig Dispense Refill  . AZITHROMYCIN 600 MG PO TABS Oral Take 2 tablets (1,200 mg total) by mouth every 7 (seven) days. 8 tablet 6  . CEPHALEXIN 500 MG PO CAPS Oral Take 1 capsule (500 mg total) by mouth 4 (four) times daily. X 10 days 40 capsule 0  . EFAVIRENZ-EMTRICITAB-TENOFOVIR 600-200-300 MG PO TABS Oral Take 1 tablet by mouth at bedtime. 30 tablet 6  . SULFAMETHOXAZOLE-TRIMETHOPRIM 800-160 MG PO TABS Oral Take 1 tablet by mouth 2 (two) times daily. X 10 days 20 tablet 0    BP 157/89  Pulse 75  Temp 99 F (37.2 C) (Oral)  Resp 17  SpO2 98%  Physical Exam  Nursing note and vitals reviewed. Constitutional: He is oriented to person, place, and time. He appears well-developed and well-nourished.  HENT:  Head: Normocephalic and atraumatic.         3 x 3 cm tender, mass with central fluctuance on left scalp, see drawing. No erythema, expressible drainage.   Eyes: Conjunctivae and EOM are normal.  Neck: Normal range of motion.  Cardiovascular: Normal rate.   Pulmonary/Chest: Effort normal. No respiratory distress.  Abdominal: He exhibits no distension.  Musculoskeletal: Normal range of motion.  Lymphadenopathy:  Head (left side): No preauricular, no posterior auricular and no occipital adenopathy present.    He has no cervical adenopathy.  Neurological: He is alert and oriented to person, place, and time.  Skin: Skin is warm and dry.  Psychiatric: He has a normal mood and affect. His behavior is normal.    ED Course  INCISION AND DRAINAGE Date/Time: 04/30/2012 4:44 PM Performed by: Luiz Blare Authorized by: Luiz Blare Consent: Verbal consent obtained. Risks and benefits: risks, benefits and alternatives were discussed Consent given by: patient Patient understanding: patient states understanding  of the procedure being performed Patient consent: the patient's understanding of the procedure matches consent given Required items: required blood products, implants, devices, and special equipment available Patient identity confirmed: verbally with patient Time out: Immediately prior to procedure a "time out" was called to verify the correct patient, procedure, equipment, support staff and site/side marked as required. Type: abscess Body area: head/neck Location details: scalp Local anesthetic: lidocaine 2% with epinephrine Anesthetic total: 4 ml Patient sedated: no Scalpel size: 11 Incision type: single straight Complexity: simple Drainage: purulent Drainage amount: copious Wound treatment: wound left open Patient tolerance: Patient tolerated the procedure well with no immediate complications. Comments: Drained extensive amount of thick, yellowish purulent material. Sent this off for culture. Swelling completely resolved. Applied bacitracin and sterile pressure dressing.   (including critical care time)  Labs Reviewed - No data to display No results found.   1. Abscess, scalp     MDM  Updating tetanus. Suspect that this was a sebaceous cyst, now infected as it has central fluctuance. Will do I&D, sent for culture. Already on Bactrim and azithromycin. Will change the Bactrim to DS 1 tab twice a day x10 days, also start him on Keflex. Will have him followup with infectious disease for reevaluation, management of his hypertension. Patient is otherwise a symptomatic today.`  Luiz Blare, MD 04/30/12 [phone]

## 2012-04-30 NOTE — ED Notes (Signed)
Drew up lidocaine-epinephrine 2% -1:100000 in a 5ml syringe with a blunt fill needle 18g 1 1/2 and place it by bedside with suture tray

## 2012-04-30 NOTE — Discharge Instructions (Signed)
Return here or follow up with your doctor in 2 days for a wound check. He need to see the infectious disease clinic seen, for repeat blood work and management of your blood pressure. Return sooner if you get worse, have a fever >100.4, or any other concerns. Take the medication as written. Take 1 gram of tylenol with 600 mg of motrin up to 4 times a day as needed for pain and fever.

## 2012-04-30 NOTE — ED Notes (Signed)
Skin lump to scalp x 2+ months, worse after he reportedly bumped it yesterday; skin intact, no drainage or abrasion

## 2012-05-03 LAB — CULTURE, ROUTINE-ABSCESS

## 2012-07-16 ENCOUNTER — Encounter (HOSPITAL_COMMUNITY): Payer: Self-pay | Admitting: Emergency Medicine

## 2012-07-16 ENCOUNTER — Emergency Department (HOSPITAL_COMMUNITY)
Admission: EM | Admit: 2012-07-16 | Discharge: 2012-07-16 | Discharge status: Absent without leave | Payer: Self-pay | Source: Home / Self Care

## 2012-07-16 ENCOUNTER — Emergency Department (INDEPENDENT_AMBULATORY_CARE_PROVIDER_SITE_OTHER)
Admission: EM | Admit: 2012-07-16 | Discharge: 2012-07-16 | Disposition: A | Discharge status: Home / Self Care | Payer: Self-pay | Source: Home / Self Care

## 2012-07-16 DIAGNOSIS — R220 Localized swelling, mass and lump, head: Secondary | ICD-10-CM

## 2012-07-16 DIAGNOSIS — S0990XA Unspecified injury of head, initial encounter: Secondary | ICD-10-CM

## 2012-07-16 DIAGNOSIS — R221 Localized swelling, mass and lump, neck: Secondary | ICD-10-CM

## 2012-07-16 MED ORDER — AMOXICILLIN-POT CLAVULANATE 875-125 MG PO TABS: 1.0000 | ORAL_TABLET | Freq: Two times a day (BID) | ORAL | Status: AC

## 2012-07-16 NOTE — ED Provider Notes (Signed)
History     CSN: [id number]  Arrival date & time 07/16/12  1328   None     Chief Complaint  Patient presents with  . Cyst    (Consider location/radiation/quality/duration/timing/severity/associated sxs/prior treatment) HPI Comments: Problem 2: This AM felt a pinch in the L cheek. Thinks it was a bug bite.  Over ensuing 4-6 hrs has developed localized swelling and induration in between maxilla and mandible . There is no dental or gingival involvement. No open areas or drainage.   Patient is a 51 y.o. male presenting with head injury. The history is provided by the patient.  Head Injury  The incident occurred 3 to 5 hours ago. He came to the ER via walk-in. The injury mechanism was a direct blow. There was no loss of consciousness. There was no blood loss. The quality of the pain is described as dull. The pain is at a severity of 3/10. The pain is mild. The pain has been constant since the injury. Associated symptoms include patient experiences disorientation. Pertinent negatives include no numbness, no blurred vision, no vomiting, no tinnitus, no weakness and no memory loss.    Past Medical History  Diagnosis Date  . Hypertension   . Hepatitis B   . Immune deficiency disorder   . HIV disease   . AIDS     Past Surgical History  Procedure Date  . Hernia repair     No family history on file.  History  Substance Use Topics  . Smoking status: Former Smoker    Types: Cigarettes    Quit date: 05/01/2011  . Smokeless tobacco: Not on file  . Alcohol Use: No     quit 02/2011      Review of Systems  Constitutional: Positive for fatigue. Negative for fever, chills and activity change.  HENT: Positive for facial swelling. Negative for neck pain, neck stiffness, tinnitus and ear discharge.   Eyes: Negative.  Negative for blurred vision.  Respiratory: Negative.   Cardiovascular: Negative.   Gastrointestinal: Negative for vomiting.  Musculoskeletal: Negative for joint swelling  and gait problem.  Neurological: Positive for light-headedness and headaches. Negative for dizziness, tremors, seizures, syncope, speech difficulty, weakness and numbness.  Hematological: Negative for adenopathy.  Psychiatric/Behavioral: Negative for memory loss.    Allergies  Review of patient's allergies indicates no known allergies.  Home Medications   Current Outpatient Rx  Name Route Sig Dispense Refill  . AMOXICILLIN-POT CLAVULANATE 875-125 MG PO TABS Oral Take 1 tablet by mouth every 12 (twelve) hours. 14 tablet 0  . EFAVIRENZ-EMTRICITAB-TENOFOVIR 600-200-300 MG PO TABS Oral Take 1 tablet by mouth at bedtime. 30 tablet 6    BP 163/93  Pulse 70  Temp 97.7 F (36.5 C) (Oral)  Resp 18  SpO2 100%  Physical Exam  Constitutional: He is oriented to person, place, and time. He appears well-developed and well-nourished.  HENT:  Head: Head is with contusion.    Eyes: Conjunctivae are normal. Pupils are equal, round, and reactive to light.  Neck: Normal range of motion. Neck supple.  Pulmonary/Chest: No respiratory distress.  Musculoskeletal: Normal range of motion. He exhibits no edema.  Neurological: He is alert and oriented to person, place, and time. He has normal strength. No cranial nerve deficit or sensory deficit.  Skin: Skin is warm and dry. No rash noted. No erythema. No pallor.  Psychiatric: He has a normal mood and affect.    ED Course  Procedures (including critical care time)  Labs Reviewed -  No data to display No results found.   1. Left facial swelling   2. Head injury, acute, without loss of consciousness       MDM  Bumped head on cage door raising up from bending over position with hematoma post vertex. Neuro neg, although feels more tired than usual.  Felt a pinch on L faces at 1100 this AM           Hayden Rasmussen, NP 07/16/12 1810

## 2012-07-16 NOTE — ED Notes (Signed)
Patient has a bump on back of head-noticed today. Patient reports hitting his head on equipment today, head hurts.   Then patient noticed bump to left jaw today as well.

## 2012-07-16 NOTE — ED Notes (Signed)
Delay in discharge secondary to department acuity/transfer

## 2012-07-18 NOTE — ED Provider Notes (Signed)
Medical screening examination/treatment/procedure(s) were performed by non-physician practitioner and as supervising physician I was immediately available for consultation/collaboration.  Jaclynne Baldo   Rekia Kujala, MD 07/18/12 1720 

## 2012-08-17 ENCOUNTER — Encounter (HOSPITAL_COMMUNITY): Payer: Self-pay | Admitting: *Deleted

## 2012-08-17 ENCOUNTER — Emergency Department (INDEPENDENT_AMBULATORY_CARE_PROVIDER_SITE_OTHER)
Admission: EM | Admit: 2012-08-17 | Discharge: 2012-08-17 | Disposition: A | Discharge status: Home / Self Care | Payer: Self-pay | Source: Home / Self Care | Attending: Emergency Medicine | Admitting: Emergency Medicine

## 2012-08-17 DIAGNOSIS — IMO0002 Reserved for concepts with insufficient information to code with codable children: Secondary | ICD-10-CM

## 2012-08-17 DIAGNOSIS — L03818 Cellulitis of other sites: Secondary | ICD-10-CM

## 2012-08-17 DIAGNOSIS — R52 Pain, unspecified: Secondary | ICD-10-CM

## 2012-08-17 MED ORDER — DOXYCYCLINE HYCLATE 100 MG PO CAPS: 100.0000 mg | ORAL_CAPSULE | Freq: Two times a day (BID) | ORAL | Status: DC

## 2012-08-17 MED ORDER — TRAMADOL HCL 50 MG PO TABS: 50.0000 mg | ORAL_TABLET | Freq: Four times a day (QID) | ORAL | Status: DC | PRN

## 2012-08-17 NOTE — ED Notes (Signed)
Pt  Has  A  Large mass  On  Top  Of  His  heada      Worse  Over  Last   Few  Days   Pt  Reports  Similar   Symptoms  Earlier  This  Year    He  Reports  The  Mass  Is  painfull  To the  Touch  - he  denys  Any  Other  Symptoms         The  Mass is  About the  Size  Of a  Ping pong ball

## 2012-08-17 NOTE — ED Provider Notes (Signed)
History     CSN: [id number]  Arrival date & time 08/17/12  1658   First MD Initiated Contact with Patient 08/17/12 1933      Chief Complaint  Patient presents with  . Cyst    (Consider location/radiation/quality/duration/timing/severity/associated sxs/prior treatment) The history is provided by the patient.  This patient presents for evaluation of a cutaneous abscess.  The lesion is located on the head.  Onset was 3 days ago.  Symptoms have been worsening and enlarging.  Abscess has associated symptoms of tenderness.  The patient does have previous history of cutaneous abscesses, similar on head about one month ago.  There is no known previous history of MRSA.   They do not have a history of diabetes. Has had I and D of previous abscesses.  Past Medical History  Diagnosis Date  . Hypertension   . Hepatitis B   . Immune deficiency disorder   . HIV disease   . AIDS     Past Surgical History  Procedure Date  . Hernia repair     History reviewed. No pertinent family history.  History  Substance Use Topics  . Smoking status: Former Smoker    Types: Cigarettes    Quit date: 05/01/2011  . Smokeless tobacco: Not on file  . Alcohol Use: No     quit 02/2011      Review of Systems  Skin: Positive for wound. Negative for color change, pallor and rash.  Neurological: Positive for light-headedness and headaches.    Allergies  Review of patient's allergies indicates no known allergies.  Home Medications   Current Outpatient Rx  Name Route Sig Dispense Refill  . DOXYCYCLINE HYCLATE 100 MG PO CAPS Oral Take 1 capsule (100 mg total) by mouth 2 (two) times daily. 20 capsule 0  . EFAVIRENZ-EMTRICITAB-TENOFOVIR 600-200-300 MG PO TABS Oral Take 1 tablet by mouth at bedtime. 30 tablet 6  . TRAMADOL HCL 50 MG PO TABS Oral Take 1 tablet (50 mg total) by mouth every 6 (six) hours as needed for pain. 15 tablet 0    BP 152/91  Pulse 70  Temp 98.1 F (36.7 C) (Oral)  Resp  16  SpO2 99%  Physical Exam  Nursing note and vitals reviewed. Constitutional: He is oriented to person, place, and time. Vital signs are normal. He appears well-developed and well-nourished. He is active and cooperative.  HENT:  Head: Normocephalic.    Right Ear: Hearing, tympanic membrane, external ear and ear canal normal.  Left Ear: Hearing, tympanic membrane, external ear and ear canal normal.  Nose: Nose normal. Right sinus exhibits no maxillary sinus tenderness and no frontal sinus tenderness. Left sinus exhibits no maxillary sinus tenderness and no frontal sinus tenderness.  Mouth/Throat: Uvula is midline, oropharynx is clear and moist and mucous membranes are normal.  Eyes: Conjunctivae normal are normal. Pupils are equal, round, and reactive to light. No scleral icterus.  Neck: Trachea normal, normal range of motion and full passive range of motion without pain. Neck supple. No spinous process tenderness and no muscular tenderness present.  Cardiovascular: Normal rate, regular rhythm, normal heart sounds and normal pulses.   Pulmonary/Chest: Effort normal and breath sounds normal.  Lymphadenopathy:       Head (right side): No submental, no submandibular, no tonsillar, no preauricular, no posterior auricular and no occipital adenopathy present.       Head (left side): No submental, no submandibular, no tonsillar, no preauricular, no posterior auricular and no occipital adenopathy present.  He has no cervical adenopathy.  Neurological: He is alert and oriented to person, place, and time. No cranial nerve deficit or sensory deficit.  Skin: Skin is warm and dry. Lesion noted.  Psychiatric: He has a normal mood and affect. His speech is normal and behavior is normal. Judgment and thought content normal. Cognition and memory are normal.    ED Course  Procedures (including critical care time)  Labs Reviewed - No data to display No results found.   1. Abscess or cellulitis of  head or scalp   2. Pain       MDM  Warm compresses to abscess.  Begin antibiotics as prescribed, rtc in 3 days or follow up with your primary care provider for wound recheck.        Johnsie Kindred, NP 08/17/12 2005

## 2012-08-18 NOTE — ED Provider Notes (Signed)
Medical screening examination/treatment/procedure(s) were performed by non-physician practitioner and as supervising physician I was immediately available for consultation/collaboration.  Leslee Home, M.D.   Reuben Likes, MD 08/18/12 [phone]

## 2012-11-03 ENCOUNTER — Emergency Department (INDEPENDENT_AMBULATORY_CARE_PROVIDER_SITE_OTHER)
Admission: EM | Admit: 2012-11-03 | Discharge: 2012-11-03 | Disposition: A | Discharge status: Home / Self Care | Payer: Self-pay | Source: Home / Self Care | Attending: Family Medicine | Admitting: Family Medicine

## 2012-11-03 ENCOUNTER — Encounter (HOSPITAL_COMMUNITY): Payer: Self-pay | Admitting: *Deleted

## 2012-11-03 DIAGNOSIS — L0390 Cellulitis, unspecified: Secondary | ICD-10-CM

## 2012-11-03 MED ORDER — MUPIROCIN CALCIUM 2 % EX CREA: TOPICAL_CREAM | Freq: Three times a day (TID) | CUTANEOUS | Status: DC

## 2012-11-03 MED ORDER — DOXYCYCLINE HYCLATE 100 MG PO CAPS: 100.0000 mg | ORAL_CAPSULE | Freq: Two times a day (BID) | ORAL | Status: DC

## 2012-11-03 NOTE — ED Notes (Signed)
swelling  r  Side  Face    Started  yest        painfull  To  Touch    No  Dental  Involvement  No  Known  Injury

## 2012-11-03 NOTE — ED Provider Notes (Signed)
History     CSN: [id number]  Arrival date & time 11/03/12  1346   First MD Initiated Contact with Patient 11/03/12 1528      Chief Complaint  Patient presents with  . Facial Swelling    (Consider location/radiation/quality/duration/timing/severity/associated sxs/prior treatment) Patient is a 51 y.o. male presenting with rash. The history is provided by the patient.  Rash  This is a new problem. The current episode started yesterday. The problem has been gradually worsening. The problem is associated with an insect bite/sting. There has been no fever. The rash is present on the face. The pain is mild. Associated symptoms include itching and pain.    Past Medical History  Diagnosis Date  . Hypertension   . Hepatitis B   . Immune deficiency disorder   . HIV disease   . AIDS     Past Surgical History  Procedure Date  . Hernia repair     No family history on file.  History  Substance Use Topics  . Smoking status: Former Smoker    Types: Cigarettes    Quit date: 05/01/2011  . Smokeless tobacco: Not on file  . Alcohol Use: No     Comment: quit 02/2011      Review of Systems  Constitutional: Negative.   HENT: Positive for facial swelling. Negative for congestion, rhinorrhea, dental problem and postnasal drip.   Skin: Positive for itching and rash.    Allergies  Review of patient's allergies indicates no known allergies.  Home Medications   Current Outpatient Rx  Name  Route  Sig  Dispense  Refill  . DOXYCYCLINE HYCLATE 100 MG PO CAPS   Oral   Take 1 capsule (100 mg total) by mouth 2 (two) times daily.   20 capsule   0   . DOXYCYCLINE HYCLATE 100 MG PO CAPS   Oral   Take 1 capsule (100 mg total) by mouth 2 (two) times daily.   20 capsule   0   . EFAVIRENZ-EMTRICITAB-TENOFOVIR 600-200-300 MG PO TABS   Oral   Take 1 tablet by mouth at bedtime.   30 tablet   6   . MUPIROCIN CALCIUM 2 % EX CREA   Topical   Apply topically 3 (three) times daily.  After warm soak to facial swelling.   30 g   0   . TRAMADOL HCL 50 MG PO TABS   Oral   Take 1 tablet (50 mg total) by mouth every 6 (six) hours as needed for pain.   15 tablet   0     BP 122/78  Pulse 72  Temp 98.6 F (37 C) (Oral)  Resp 18  SpO2 100%  Physical Exam  Nursing note and vitals reviewed. Constitutional: He appears well-developed and well-nourished.  HENT:  Head: Normocephalic.  Mouth/Throat: Oropharynx is clear and moist.  Eyes: Pupils are equal, round, and reactive to light.  Neck: Normal range of motion. Neck supple.  Lymphadenopathy:    He has no cervical adenopathy.  Skin: Skin is warm and dry. Rash noted. There is erythema.       Local sts to right cheek with central bite site, no drainage or fluctuance.    ED Course  Procedures (including critical care time)  Labs Reviewed - No data to display No results found.   1. Cellulitis       MDM         [name], MD 11/05/12 [phone]

## 2013-02-05 ENCOUNTER — Emergency Department (HOSPITAL_COMMUNITY): Payer: Self-pay

## 2013-02-05 ENCOUNTER — Encounter (HOSPITAL_COMMUNITY): Payer: Self-pay | Admitting: *Deleted

## 2013-02-05 ENCOUNTER — Emergency Department (HOSPITAL_COMMUNITY)
Admission: EM | Admit: 2013-02-05 | Discharge: 2013-02-05 | Disposition: A | Discharge status: Home Health | Payer: Self-pay | Attending: Emergency Medicine | Admitting: Emergency Medicine

## 2013-02-05 DIAGNOSIS — Z8619 Personal history of other infectious and parasitic diseases: Secondary | ICD-10-CM | POA: Insufficient documentation

## 2013-02-05 DIAGNOSIS — Z87891 Personal history of nicotine dependence: Secondary | ICD-10-CM | POA: Insufficient documentation

## 2013-02-05 DIAGNOSIS — Z8639 Personal history of other endocrine, nutritional and metabolic disease: Secondary | ICD-10-CM | POA: Insufficient documentation

## 2013-02-05 DIAGNOSIS — Z862 Personal history of diseases of the blood and blood-forming organs and certain disorders involving the immune mechanism: Secondary | ICD-10-CM | POA: Insufficient documentation

## 2013-02-05 DIAGNOSIS — R42 Dizziness and giddiness: Secondary | ICD-10-CM | POA: Insufficient documentation

## 2013-02-05 DIAGNOSIS — Z79899 Other long term (current) drug therapy: Secondary | ICD-10-CM | POA: Insufficient documentation

## 2013-02-05 DIAGNOSIS — E876 Hypokalemia: Secondary | ICD-10-CM | POA: Insufficient documentation

## 2013-02-05 DIAGNOSIS — B20 Human immunodeficiency virus [HIV] disease: Secondary | ICD-10-CM | POA: Insufficient documentation

## 2013-02-05 DIAGNOSIS — I10 Essential (primary) hypertension: Secondary | ICD-10-CM | POA: Insufficient documentation

## 2013-02-05 DIAGNOSIS — R51 Headache: Secondary | ICD-10-CM | POA: Insufficient documentation

## 2013-02-05 LAB — CBC WITH DIFFERENTIAL/PLATELET
Basophils Absolute: 0 10*3/uL (ref 0.0–0.1)
Basophils Relative: 0 % (ref 0–1)
Eosinophils Relative: 6 % — ABNORMAL HIGH (ref 0–5)
HCT: 35.5 % — ABNORMAL LOW (ref 39.0–52.0)
Hemoglobin: 11.9 g/dL — ABNORMAL LOW (ref 13.0–17.0)
MCH: 26.9 pg (ref 26.0–34.0)
MCHC: 33.5 g/dL (ref 30.0–36.0)
MCV: 80.3 fL (ref 78.0–100.0)
Monocytes Absolute: 0.6 10*3/uL (ref 0.1–1.0)
Monocytes Relative: 9 % (ref 3–12)
Neutro Abs: 4.4 10*3/uL (ref 1.7–7.7)
RDW: 14.5 % (ref 11.5–15.5)

## 2013-02-05 LAB — COMPREHENSIVE METABOLIC PANEL
Albumin: 3.4 g/dL — ABNORMAL LOW (ref 3.5–5.2)
BUN: 8 mg/dL (ref 6–23)
Calcium: 9.3 mg/dL (ref 8.4–10.5)
Chloride: 101 mEq/L (ref 96–112)
Creatinine, Ser: 0.8 mg/dL (ref 0.50–1.35)
GFR calc non Af Amer: >90 mL/min (ref >90)
Total Bilirubin: 0.3 mg/dL (ref 0.3–1.2)

## 2013-02-05 LAB — GLUCOSE, CAPILLARY: Glucose-Capillary: 130 mg/dL — ABNORMAL HIGH (ref 70–99)

## 2013-02-05 LAB — POCT I-STAT TROPONIN I: Troponin i, poc: 0 ng/mL (ref 0.00–0.08)

## 2013-02-05 MED ORDER — NAPROXEN 500 MG PO TABS: 500.0000 mg | ORAL_TABLET | Freq: Two times a day (BID) | ORAL | Status: DC

## 2013-02-05 MED ORDER — SULFAMETHOXAZOLE-TRIMETHOPRIM 800-160 MG PO TABS: 1.0000 | ORAL_TABLET | Freq: Two times a day (BID) | ORAL | Status: DC

## 2013-02-05 MED ORDER — MORPHINE SULFATE 4 MG/ML IJ SOLN
4.0000 mg | Freq: Once | INTRAMUSCULAR | Status: AC
  Administered 2013-02-05: 4 mg via INTRAVENOUS
  Filled 2013-02-05: qty 1

## 2013-02-05 MED ORDER — POTASSIUM CHLORIDE CRYS ER 20 MEQ PO TBCR: 20.0000 meq | EXTENDED_RELEASE_TABLET | Freq: Every day | ORAL | Status: DC

## 2013-02-05 MED ORDER — SODIUM CHLORIDE 0.9 % IV BOLUS (SEPSIS)
1000.0000 mL | INTRAVENOUS | Status: AC
  Administered 2013-02-05: 1000 mL via INTRAVENOUS

## 2013-02-05 NOTE — ED Provider Notes (Signed)
History     CSN: [id number]  Arrival date & time 02/05/13  1242   First MD Initiated Contact with Patient 02/05/13 1254      Chief Complaint  Patient presents with  . Near Syncope    (Consider location/radiation/quality/duration/timing/severity/associated sxs/prior treatment) HPI Comments: 52 year old male who presents with a complaint of dizziness. 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 This was persistent, lasted several minutes and then gradually resolved. It is not associated with syncope, it is not associated with blurred vision and was not associated with nausea or vomiting. He describes the feeling as if the room was moving. At this point his symptoms have resolved though he still has his headache.  This headache started approximately 2 months ago, has everyday, seems to be worse when he wakes up in the morning but persistent throughout the day, is not associated with weakness numbness difficulty walking or vision.  The patient does have HIV, he is taking his medications including atripla, does not recall the last time that he was at the infectious disease doctor.  He denies having anything D. Or drink this morning.  The history is provided by the patient.    Past Medical History  Diagnosis Date  . Hypertension   . Hepatitis B   . Immune deficiency disorder   . HIV disease   . AIDS     Past Surgical History  Procedure Laterality Date  . Hernia repair      No family history on file.  History  Substance Use Topics  . Smoking status: Former Smoker    Types: Cigarettes    Quit date: 05/01/2011  . Smokeless tobacco: Not on file  . Alcohol Use: No     Comment: quit 02/2011      Review of  Systems  All other systems reviewed and are negative.    Allergies  Review of patient's allergies indicates no known allergies.  Home Medications   Current Outpatient Rx  Name  Route  Sig  Dispense  Refill  . acetaminophen (TYLENOL) 325 MG tablet   Oral   Take 650 mg by mouth every 6 (six) hours as needed for pain.         Marland Kitchen doxycycline (VIBRAMYCIN) 100 MG capsule   Oral   Take 1 capsule (100 mg total) by mouth 2 (two) times daily.   20 capsule   0   . efavirenz-emtrictabine-tenofovir (ATRIPLA) 600-200-300 MG per tablet   Oral   Take 1 tablet by mouth at bedtime.   30 tablet   6   . naproxen (NAPROSYN) 500 MG tablet   Oral   Take 1 tablet (500 mg total) by mouth 2 (two) times daily with a meal.   30 tablet   0   . potassium chloride SA (K-DUR,KLOR-CON) 20 MEQ tablet   Oral   Take 1 tablet (20 mEq total) by mouth daily.   10 tablet   0   . sulfamethoxazole-trimethoprim (SEPTRA DS) 800-160 MG per tablet   Oral   Take 1 tablet by mouth every 12 (twelve) hours.   20 tablet   0     BP 171/109  Temp(Src) 99 F (37.2 C) (Oral)  Resp 18  Ht 5\' 6"  (1.676 m)  Wt 150 lb (68.04 kg)  BMI 24.22 kg/m2  SpO2 98%  Physical Exam  Nursing note and vitals reviewed. Constitutional: He appears well-developed and well-nourished. No distress.  HENT:  Head: Normocephalic and atraumatic.  Mouth/Throat: Oropharynx is clear and moist. No oropharyngeal exudate.  Tympanic membranes clear bilaterally  Eyes: Conjunctivae and EOM are normal. Pupils are equal, round, and reactive to light. Right eye exhibits no discharge. Left eye exhibits no discharge. No scleral icterus.  Neck: Normal range of motion. Neck supple. No JVD present. No thyromegaly present.  Cardiovascular: Regular rhythm, normal heart sounds and intact distal pulses.  Exam reveals no gallop and no friction rub.   No murmur heard. tachycardia  Pulmonary/Chest: Effort normal and breath sounds normal. No respiratory  distress. He has no wheezes. He has no rales.  Abdominal: Soft. Bowel sounds are normal. He exhibits no distension and no mass. There is no tenderness.  Musculoskeletal: Normal range of motion. He exhibits no edema and no tenderness.  Lymphadenopathy:    He has no cervical adenopathy.  Neurological: He is alert. Coordination normal.  Neurologic exam:  Speech clear, pupils equal round reactive to light, extraocular movements intact  Normal peripheral visual fields Cranial nerves III through XII normal including no facial droop Follows commands, moves all extremities x4, normal strength to bilateral upper and lower extremities at all major muscle groups including grip Sensation normal to light touch and pinprick Coordination intact, no limb ataxia, finger-nose-finger normal Rapid alternating movements normal No pronator drift Gait normal   Skin: Skin is warm and dry. No rash noted. No erythema.  Psychiatric: He has a normal mood and affect. His behavior is normal.    ED Course  Procedures (including critical care time)  Labs Reviewed  CBC WITH DIFFERENTIAL - Abnormal; Notable for the following:    Hemoglobin 11.9 (*)    HCT 35.5 (*)    Eosinophils Relative 6 (*)    All other components within normal limits  COMPREHENSIVE METABOLIC PANEL - Abnormal; Notable for the following:    Potassium 3.1 (*)    Glucose, Bld 111 (*)    Total Protein 9.0 (*)    Albumin 3.4 (*)    All other components within normal limits  GLUCOSE, CAPILLARY - Abnormal; Notable for the following:    Glucose-Capillary 130 (*)    All other components within normal limits  POCT I-STAT TROPONIN I  POCT CBG (FASTING - GLUCOSE)-MANUAL ENTRY   Ct Head Wo Contrast  02/05/2013  *RADIOLOGY REPORT*  Clinical Data: Dizziness, syncope, daily headache.  CT HEAD WITHOUT CONTRAST  Technique:  Contiguous axial images were obtained from the base of the skull through the vertex without contrast.  Comparison: 06/14/1999 by  report only  Findings: There is no evidence of acute intracranial hemorrhage, brain edema, mass lesion, acute infarction,   mass effect, or midline shift. Acute infarct may be inapparent on noncontrast CT. No other intra-axial abnormalities are seen, and the ventricles and sulci are within normal limits in size and symmetry.   No abnormal extra-axial fluid collections or masses are identified.  No significant calvarial abnormality.  IMPRESSION: 1. Negative for bleed or other acute intracranial process.   Original Report Authenticated By: D. Andria Rhein, MD      1. Light headed   2. Hypokalemia       MDM  The patient is a sinus tachycardia, has slight hypertension but no fever. Given the ongoing  symptoms of headache I would be concerned for malignancy as the patient does have HIV would also be concerned for intracranial infection, CT scan pending. We'll obtain laboratory work to evaluate for patient's baseline blood counts, renal function and electrolytes. EKG shows sinus tachycardia without any acute ischemic findings but some nonspecific ST and T wave changes.  ED ECG REPORT  I personally interpreted this EKG   Date: 02/05/2013   Rate: 112  Rhythm: sinus tachycardia  QRS Axis: normal  Intervals: normal  ST/T Wave abnormalities: nonspecific ST/T changes  Conduction Disutrbances:none  Narrative Interpretation:   Old EKG Reviewed: none available   According to the medical record, the last documented CD4 count was from May of 2012 and was 100.    The patient will be started on Bactrim to be taken daily as a preventative medication secondary to his low CD4 counts. I have given Naprosyn for headaches and potassium supplementation for his slight hypokalemia. At this time the patient appears stable for discharge, his vital signs have improved and I have discussed with him all of his results. He has expressed his understanding and feels comfortable going home the  Vida Roller, MD 02/05/13  1456

## 2013-02-05 NOTE — ED Notes (Signed)
Pt discharged to home with family. NAD.  

## 2013-02-05 NOTE — ED Notes (Addendum)
Pt states he was was pulling a heavy bag, became dizzy and fell down.  Denies loc.  Pt denies this every happening before. Pt also c/o headache every morning when he wakes up for several months. Pt very tearful.

## 2013-07-05 ENCOUNTER — Other Ambulatory Visit (INDEPENDENT_AMBULATORY_CARE_PROVIDER_SITE_OTHER): Payer: Self-pay

## 2013-07-05 ENCOUNTER — Ambulatory Visit: Payer: Self-pay

## 2013-07-05 DIAGNOSIS — Z113 Encounter for screening for infections with a predominantly sexual mode of transmission: Secondary | ICD-10-CM

## 2013-07-05 DIAGNOSIS — B20 Human immunodeficiency virus [HIV] disease: Secondary | ICD-10-CM

## 2013-07-05 NOTE — Addendum Note (Signed)
Addended by: Mariea Clonts D on: 07/05/2013 04:46 PM   Modules accepted: Orders

## 2013-07-05 NOTE — Addendum Note (Signed)
Addended by: Mariea Clonts D on: 07/05/2013 04:25 PM   Modules accepted: Orders

## 2013-07-06 LAB — COMPREHENSIVE METABOLIC PANEL
AST: 23 U/L (ref 0–37)
Alkaline Phosphatase: 68 U/L (ref 39–117)
BUN: 9 mg/dL (ref 6–23)
Glucose, Bld: 84 mg/dL (ref 70–99)
Total Bilirubin: 0.4 mg/dL (ref 0.3–1.2)

## 2013-07-06 LAB — CBC WITH DIFFERENTIAL/PLATELET
Basophils Absolute: 0.1 10*3/uL (ref 0.0–0.1)
Basophils Relative: 2 % — ABNORMAL HIGH (ref 0–1)
Eosinophils Absolute: 0.8 10*3/uL — ABNORMAL HIGH (ref 0.0–0.7)
Eosinophils Relative: 25 % — ABNORMAL HIGH (ref 0–5)
MCH: 27.6 pg (ref 26.0–34.0)
MCHC: 33.1 g/dL (ref 30.0–36.0)
MCV: 83.5 fL (ref 78.0–100.0)
Neutrophils Relative %: 28 % — ABNORMAL LOW (ref 43–77)
Platelets: 243 10*3/uL (ref 150–400)
RDW: 15.1 % (ref 11.5–15.5)

## 2013-07-06 LAB — T-HELPER CELL (CD4) - (RCID CLINIC ONLY): CD4 T Cell Abs: 30 uL — ABNORMAL LOW (ref 400–2700)

## 2013-07-06 LAB — HIV-1 RNA QUANT-NO REFLEX-BLD: HIV-1 RNA Quant, Log: 4.03 {Log} — ABNORMAL HIGH (ref <1.30)

## 2013-07-13 ENCOUNTER — Encounter (HOSPITAL_COMMUNITY): Payer: Self-pay | Admitting: *Deleted

## 2013-07-13 ENCOUNTER — Telehealth: Payer: Self-pay | Admitting: Licensed Clinical Social Worker

## 2013-07-13 ENCOUNTER — Emergency Department (HOSPITAL_COMMUNITY)
Admission: EM | Admit: 2013-07-13 | Discharge: 2013-07-13 | Disposition: A | Discharge status: Home / Self Care | Payer: Self-pay | Attending: Emergency Medicine | Admitting: Emergency Medicine

## 2013-07-13 ENCOUNTER — Other Ambulatory Visit: Payer: Self-pay | Admitting: Infectious Disease

## 2013-07-13 DIAGNOSIS — I10 Essential (primary) hypertension: Secondary | ICD-10-CM | POA: Insufficient documentation

## 2013-07-13 DIAGNOSIS — M5431 Sciatica, right side: Secondary | ICD-10-CM

## 2013-07-13 DIAGNOSIS — M5430 Sciatica, unspecified side: Secondary | ICD-10-CM | POA: Insufficient documentation

## 2013-07-13 DIAGNOSIS — B20 Human immunodeficiency virus [HIV] disease: Secondary | ICD-10-CM | POA: Insufficient documentation

## 2013-07-13 DIAGNOSIS — Z8619 Personal history of other infectious and parasitic diseases: Secondary | ICD-10-CM | POA: Insufficient documentation

## 2013-07-13 DIAGNOSIS — Z87891 Personal history of nicotine dependence: Secondary | ICD-10-CM | POA: Insufficient documentation

## 2013-07-13 MED ORDER — NAPROXEN 375 MG PO TABS: 375.0000 mg | ORAL_TABLET | Freq: Two times a day (BID) | ORAL | Status: DC

## 2013-07-13 MED ORDER — HYDROCODONE-ACETAMINOPHEN 5-325 MG PO TABS: 1.0000 | ORAL_TABLET | ORAL | Status: DC | PRN

## 2013-07-13 MED ORDER — OXYCODONE-ACETAMINOPHEN 5-325 MG PO TABS
1.0000 | ORAL_TABLET | Freq: Once | ORAL | Status: AC
  Administered 2013-07-13: 1 via ORAL
  Filled 2013-07-13: qty 1

## 2013-07-13 NOTE — ED Notes (Signed)
Pt in c/o pain to his right leg starting in his right buttocks, states he was helping someone move and the pain has progressed since that time, states it radiates down into his foot, increased pain with movement or certain positions

## 2013-07-13 NOTE — Telephone Encounter (Signed)
High risk for no show then

## 2013-07-13 NOTE — Telephone Encounter (Signed)
I called the patient to let him know that he needs to discontinue his Atripla because he now has resistance. He did not answer and voicemail was not available. Dr. Zenaida Niece dam would like to change his regimen to prezista, norvir, and truvada, he has an appointment on 07/27/13 with Dr. Daiva Eves.

## 2013-07-13 NOTE — ED Provider Notes (Signed)
Medical screening examination/treatment/procedure(s) were performed by non-physician practitioner and as supervising physician I was immediately available for consultation/collaboration.   Junius Argyle, MD 07/13/13 [phone]

## 2013-07-13 NOTE — ED Provider Notes (Signed)
CSN: [id number]     Arrival date & time 07/13/13  1109 History   First MD Initiated Contact with Patient 07/13/13 1137     Chief Complaint  Patient presents with  . Leg Pain   (Consider location/radiation/quality/duration/timing/severity/associated sxs/prior Treatment) HPI  Kenneth Rivas is a 52 y.o.male with a significant PMH of HIV, Hepatitis and hypertension presents to the ER with complaints of right hip pain that shoots down his leg into his hip. He describes the pain lasting for past few days. Endorses having been sitting longer than normal. He is currently taking treatment by Dr. Daiva Eves for his HIV/AIDS. Says as far as he knows his counts have been good. He has not any any weakness, fevers, dysuria, nausea, vomiting or diarrhea. He says aside from the shooting pain he feels fine. Has not had a recent fall or trauma, denies having any rashes or breakdown in skin over the area. Describes the pain and deep and really strong. No difficulty ambulating unless the pain gets bad. Has not tried anything at home for the pain.   Past Medical History  Diagnosis Date  . Hypertension   . Hepatitis B   . Immune deficiency disorder   . HIV disease   . AIDS    Past Surgical History  Procedure Laterality Date  . Hernia repair     History reviewed. No pertinent family history. History  Substance Use Topics  . Smoking status: Former Smoker    Types: Cigarettes    Quit date: 05/01/2011  . Smokeless tobacco: Not on file  . Alcohol Use: No     Comment: quit 02/2011    Review of Systems ROS is negative unless otherwise stated in the HPI  Allergies  Review of patient's allergies indicates no known allergies.  Home Medications   Current Outpatient Rx  Name  Route  Sig  Dispense  Refill  . HYDROcodone-acetaminophen (NORCO/VICODIN) 5-325 MG per tablet   Oral   Take 1-2 tablets by mouth every 4 (four) hours as needed for pain.   15 tablet   0   . naproxen (NAPROSYN) 375 MG tablet  Oral   Take 1 tablet (375 mg total) by mouth 2 (two) times daily.   20 tablet   0    BP 172/98  Pulse 78  Temp(Src) 97.8 F (36.6 C) (Oral)  Resp 20  Wt 150 lb (68.04 kg)  BMI 24.22 kg/m2  SpO2 100% Physical Exam  Nursing note and vitals reviewed. Constitutional: He appears well-developed and well-nourished. No distress.  HENT:  Head: Normocephalic and atraumatic.  Eyes: Pupils are equal, round, and reactive to light.  Neck: Normal range of motion. Neck supple.  Cardiovascular: Normal rate and regular rhythm.   Pulmonary/Chest: Effort normal.  Abdominal: Soft.  Musculoskeletal:       Right hip: He exhibits normal range of motion, normal strength, no tenderness, no bony tenderness, no swelling, no crepitus, no deformity and no laceration.  Pain to right hip and down the leg is a deep pain that can not be reproduced. He has no rash or abnormality to the external skin. His leg is not swollen or red. His right pedal pulse is strong. His skin is moist and warm.  Neurological: He is alert.  Skin: Skin is warm and dry.    ED Course  Procedures (including critical care time) Labs Review Labs Reviewed - No data to display Imaging Review No results found.  MDM   1. Sciatica neuralgia, right  Patient appears to be having nerve pain. Given his significant medical history other pathologies were considered but his physical exam makes septic joint and other emergent very unlikely. Will treat as sciatica and have patient follow-up with Dr. Daiva Eves. He plans to go to his office right from the ER today.   52 y.o.[name]'s evaluation in the Emergency Department is complete. It has been determined that no acute conditions requiring further emergency intervention are present at this time. The patient/guardian have been advised of the diagnosis and plan. We have discussed signs and symptoms that warrant return to the ED, such as changes or worsening in symptoms.  Vital signs are  stable at discharge. Filed Vitals:   07/13/13 1113  BP: 172/98  Pulse: 78  Temp: 97.8 F (36.6 C)  Resp: 20    Patient/guardian has voiced understanding and agreed to follow-up with the PCP or specialist.     Dorthula Matas, PA-C 07/13/13 1227

## 2013-07-19 ENCOUNTER — Other Ambulatory Visit: Payer: Self-pay | Admitting: Licensed Clinical Social Worker

## 2013-07-19 DIAGNOSIS — B20 Human immunodeficiency virus [HIV] disease: Secondary | ICD-10-CM

## 2013-07-19 MED ORDER — DARUNAVIR ETHANOLATE 800 MG PO TABS: 800.0000 mg | ORAL_TABLET | Freq: Every day | ORAL | Status: DC

## 2013-07-19 MED ORDER — EMTRICITABINE-TENOFOVIR DF 200-300 MG PO TABS: 1.0000 | ORAL_TABLET | Freq: Every day | ORAL | Status: DC

## 2013-07-19 MED ORDER — RITONAVIR 100 MG PO TABS: 100.0000 mg | ORAL_TABLET | Freq: Every day | ORAL | Status: DC

## 2013-07-27 ENCOUNTER — Ambulatory Visit (INDEPENDENT_AMBULATORY_CARE_PROVIDER_SITE_OTHER): Payer: Self-pay | Admitting: Infectious Disease

## 2013-07-27 ENCOUNTER — Encounter: Payer: Self-pay | Admitting: Infectious Disease

## 2013-07-27 VITALS — BP 135/86 | HR 96 | Temp 98.0°F | Ht 68.0 in | Wt 135.0 lb

## 2013-07-27 DIAGNOSIS — Z23 Encounter for immunization: Secondary | ICD-10-CM

## 2013-07-27 DIAGNOSIS — R21 Rash and other nonspecific skin eruption: Secondary | ICD-10-CM

## 2013-07-27 DIAGNOSIS — B370 Candidal stomatitis: Secondary | ICD-10-CM

## 2013-07-27 DIAGNOSIS — R630 Anorexia: Secondary | ICD-10-CM

## 2013-07-27 DIAGNOSIS — B20 Human immunodeficiency virus [HIV] disease: Secondary | ICD-10-CM

## 2013-07-27 MED ORDER — TRIAMCINOLONE ACETONIDE 0.5 % EX OINT: TOPICAL_OINTMENT | Freq: Two times a day (BID) | CUTANEOUS | Status: DC

## 2013-07-27 MED ORDER — FLUCONAZOLE 100 MG PO TABS: 100.0000 mg | ORAL_TABLET | Freq: Every day | ORAL | Status: DC

## 2013-07-27 MED ORDER — ONDANSETRON HCL 4 MG PO TABS: 4.0000 mg | ORAL_TABLET | Freq: Three times a day (TID) | ORAL | Status: DC | PRN

## 2013-07-27 MED ORDER — MEGESTROL ACETATE 625 MG/5ML PO SUSP: 625.0000 mg | Freq: Every day | ORAL | Status: DC

## 2013-07-27 MED ORDER — AZITHROMYCIN 600 MG PO TABS
1200.0000 mg | ORAL_TABLET | ORAL | Status: DC
Start: 2013-07-27 — End: 2013-08-08

## 2013-07-27 MED ORDER — SULFAMETHOXAZOLE-TMP DS 800-160 MG PO TABS: 1.0000 | ORAL_TABLET | Freq: Every day | ORAL | Status: DC

## 2013-07-27 NOTE — Patient Instructions (Addendum)
YOUR NEW REGIMEN IS:  PREZISTA 800MG  RED PILL ONCE DAILY  WITH  NORVIR 100MG  WHITE TABLET ONCE DAILY  AND  TRUVADA BLUE PILL ONCE DAILY  YOU ALSO NEED TO TAKE DS BACTRIM (WHITE PILL) ONCE DAILY TO PREVENT PCP PNEUMONIA AND  YOU NEED TO TAKE AZITHROMYCIN 2X600MG  TABLETS ONCE WEEKLY TO PREVENT MAC INFECTION  I AM WITTING A RX FOR A STEROID TO APPLY TO YOUR FOOT RASH  I AM WRITING FOR FLUCONAZOLE TO TREAT THRUSH IN THE MOUTH  I AM WRITING FOR AN APPETITE STIMULANT AND AN ANTI NAUSEA MEDICINE  PLEASE MAKE FU APPT TO SEE DR. VAN DAM IN NEXT 2 WEEKS AND BRING MEDS WITH YOU

## 2013-07-27 NOTE — Progress Notes (Signed)
Subjective:    Patient ID: Kenneth Rivas, male    DOB: 1961-05-13, 52 y.o.   MRN: [id number]  HPI  52 year-old [place] American man with HIV and AIDS not seen since 2012. 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 I've asked that he be started on Prezista Norvir and Truvada and the AIDS drug assistance program application has been put in to place although he has not yet received approval do to back log of ADAP applications. Day he comes to clinic with his roommate. He is complaining of a rash on his right leg in particular that is pruritic and macular. Has some numbness associated with this and some pain in his right lower foot or he has what appears to be a neuroma. As has obvious thrush on exam.  Review of Systems  Constitutional: Positive for activity change, appetite change, fatigue and unexpected weight change. Negative for fever, chills and diaphoresis.  HENT: Negative for congestion, sore throat, rhinorrhea, sneezing, trouble swallowing and sinus pressure.   Eyes: Negative for photophobia and visual disturbance.  Respiratory: Negative for cough, chest tightness, shortness of breath, wheezing and stridor.   Cardiovascular: Negative for chest pain, palpitations and leg swelling.  Gastrointestinal: Negative for nausea, vomiting, abdominal pain, diarrhea, constipation, blood in stool, abdominal distention and anal bleeding.  Genitourinary: Negative for dysuria, hematuria, flank pain and difficulty urinating.  Musculoskeletal: Negative for myalgias, back pain, joint swelling, arthralgias and gait problem.  Skin: Positive for pallor, rash and wound. Negative for color change.  Neurological: Negative for dizziness, tremors, weakness and light-headedness.  Hematological: Negative for adenopathy. Does not bruise/bleed easily.  Psychiatric/Behavioral: Negative for behavioral problems, confusion, sleep disturbance,  dysphoric mood, decreased concentration and agitation.       Objective:   Physical Exam  Constitutional: He is oriented to person, place, and time. He appears cachectic. No distress.  HENT:  Head: Normocephalic and atraumatic.  Mouth/Throat:    Eyes: Conjunctivae and EOM are normal.  Neck: Normal range of motion. Neck supple.  Cardiovascular: Normal rate and regular rhythm.   Pulmonary/Chest: Effort normal. No respiratory distress. He has no wheezes.  Abdominal: He exhibits no distension. There is no tenderness.  Musculoskeletal: He exhibits tenderness. He exhibits no edema.       Legs: Neurological: He is alert and oriented to person, place, and time. He exhibits normal muscle tone. Coordination normal.  Skin: Skin is warm and dry. Rash noted. He is not diaphoretic. No erythema. No pallor.  Psychiatric: He has a normal mood and affect. His behavior is normal. Judgment and thought content normal.          Assessment & Plan:   HIV: AIDS drug assistance program application has been processed we will give him samples of Prezista and Truvada and have asked for assistance with Norvir from admit. Hopefully these medications can then be used by him until he has gotten AIDS drug assistance program approval. He also a Bactrim daily for PCP prophylaxis and azithromycin weekly for Mycobacterium avium prophylaxis     Rash: Not clear what the cause of this is the pain and numbness associated with him it makes you wonder about a zoster herpetic infection although the distribution is not exactly dermatomal would not expect with zoster. Could certainly also be a variant of eosinophilic folliculitis L. try topical steroid to make sure this is responding to that. One could consider other entities such as cryptococcal  disease that is become disseminated. Does not look like Kaposi's sarcoma. Syphilis titers have also been checked and are negative.  Thrush: we'll give him fluconazole.  Poor  appetite: will prescribe megestrol

## 2013-07-28 ENCOUNTER — Telehealth: Payer: Self-pay | Admitting: *Deleted

## 2013-07-28 NOTE — Telephone Encounter (Signed)
Kenneth Rivas has been approved for Norvir with [employer] Patient Assistance.  The medication will be shipped today and should be here tomorrow or Monday.  I will call [name] and let him know.

## 2013-08-01 ENCOUNTER — Telehealth: Payer: Self-pay | Admitting: *Deleted

## 2013-08-01 NOTE — Telephone Encounter (Signed)
Unable to notify pt the medication if ready for pick-up.

## 2013-08-03 ENCOUNTER — Other Ambulatory Visit: Payer: Self-pay | Admitting: *Deleted

## 2013-08-03 ENCOUNTER — Telehealth: Payer: Self-pay | Admitting: *Deleted

## 2013-08-03 DIAGNOSIS — B20 Human immunodeficiency virus [HIV] disease: Secondary | ICD-10-CM

## 2013-08-03 MED ORDER — RITONAVIR 100 MG PO TABS: 100.0000 mg | ORAL_TABLET | Freq: Every day | ORAL | Status: DC

## 2013-08-03 MED ORDER — EMTRICITABINE-TENOFOVIR DF 200-300 MG PO TABS: 1.0000 | ORAL_TABLET | Freq: Every day | ORAL | Status: DC

## 2013-08-03 MED ORDER — DARUNAVIR ETHANOLATE 800 MG PO TABS: 800.0000 mg | ORAL_TABLET | Freq: Every day | ORAL | Status: DC

## 2013-08-03 NOTE — Telephone Encounter (Signed)
Patient came to clinic today to check on the status of his medications. Advised patient someone has been trying to contact him but we do not have a good number. He advised to call him on [phone] and that is a good number to call him. It is his roommate and they are always together. Advised him to expect a call from the pharmacy about his medications soon but that they will not deliver his medications without speaking with him. He advised he understands. Added the new number to the patient chart.

## 2013-08-08 ENCOUNTER — Telehealth: Payer: Self-pay | Admitting: *Deleted

## 2013-08-08 ENCOUNTER — Other Ambulatory Visit: Payer: Self-pay | Admitting: Licensed Clinical Social Worker

## 2013-08-08 DIAGNOSIS — B20 Human immunodeficiency virus [HIV] disease: Secondary | ICD-10-CM

## 2013-08-08 MED ORDER — RITONAVIR 100 MG PO TABS: 100.0000 mg | ORAL_TABLET | Freq: Every day | ORAL | Status: DC

## 2013-08-08 MED ORDER — DARUNAVIR ETHANOLATE 800 MG PO TABS: 800.0000 mg | ORAL_TABLET | Freq: Every day | ORAL | Status: DC

## 2013-08-08 MED ORDER — SULFAMETHOXAZOLE-TMP DS 800-160 MG PO TABS: 1.0000 | ORAL_TABLET | Freq: Every day | ORAL | Status: DC

## 2013-08-08 MED ORDER — EMTRICITABINE-TENOFOVIR DF 200-300 MG PO TABS: 1.0000 | ORAL_TABLET | Freq: Every day | ORAL | Status: DC

## 2013-08-08 MED ORDER — AZITHROMYCIN 600 MG PO TABS: 1200.0000 mg | ORAL_TABLET | ORAL | Status: DC

## 2013-08-08 MED ORDER — FLUCONAZOLE 100 MG PO TABS: 100.0000 mg | ORAL_TABLET | Freq: Every day | ORAL | Status: DC

## 2013-08-08 NOTE — Telephone Encounter (Signed)
Called patient and notified that his Norvir patient assistance medication is ready for pick up. Kenneth Rivas

## 2013-08-11 ENCOUNTER — Ambulatory Visit: Payer: Self-pay | Admitting: Infectious Disease

## 2013-08-17 ENCOUNTER — Encounter: Payer: Self-pay | Admitting: Infectious Disease

## 2013-08-17 ENCOUNTER — Ambulatory Visit (INDEPENDENT_AMBULATORY_CARE_PROVIDER_SITE_OTHER): Payer: Self-pay | Admitting: Infectious Disease

## 2013-08-17 VITALS — BP 158/114 | HR 103 | Temp 98.0°F | Wt 134.0 lb

## 2013-08-17 DIAGNOSIS — B370 Candidal stomatitis: Secondary | ICD-10-CM

## 2013-08-17 DIAGNOSIS — F17200 Nicotine dependence, unspecified, uncomplicated: Secondary | ICD-10-CM

## 2013-08-17 DIAGNOSIS — R21 Rash and other nonspecific skin eruption: Secondary | ICD-10-CM

## 2013-08-17 DIAGNOSIS — I10 Essential (primary) hypertension: Secondary | ICD-10-CM

## 2013-08-17 DIAGNOSIS — R630 Anorexia: Secondary | ICD-10-CM

## 2013-08-17 DIAGNOSIS — B20 Human immunodeficiency virus [HIV] disease: Secondary | ICD-10-CM

## 2013-08-17 DIAGNOSIS — Z113 Encounter for screening for infections with a predominantly sexual mode of transmission: Secondary | ICD-10-CM

## 2013-08-17 MED ORDER — AMLODIPINE BESYLATE 5 MG PO TABS: 5.0000 mg | ORAL_TABLET | Freq: Every day | ORAL | Status: DC

## 2013-08-17 NOTE — Progress Notes (Signed)
  Subjective:    Patient ID: Kenneth Rivas, male    DOB: 07-18-1961, 52 y.o.   MRN: [id number]  HPI   52 year-old [place] American man with HIV and AIDS not seen since 2012.[place] man with HIV and AIDS not seen since 2012. 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 I've asked that he be started on Prezista Norvir and Truvada and the AIDS drug assistance program application. We gave him prescriptions for Prezista and Truvada and he was also given drug assistance program enrollment for Norvir in the interim. Norvir diarrhea drive a few days later date sometime to pick this up. He informs that he was told not to Norvir and Prezista Norvir and Truvada has only been taking Prezista and Truvada since we last saw him. He tolerated as well his appetite is improved since starting megestrol. His thrush has improved and rash seems to be improving as well.   Review of Systems  Constitutional: Positive for activity change, appetite change, fatigue and unexpected weight change. Negative for fever, chills and diaphoresis.  HENT: Negative for congestion, sore throat, rhinorrhea, sneezing, trouble swallowing and sinus pressure.   Eyes: Negative for photophobia and visual disturbance.  Respiratory: Negative for cough, chest tightness, shortness of breath, wheezing and stridor.   Cardiovascular: Negative for chest pain, palpitations and leg swelling.  Gastrointestinal: Negative for nausea, vomiting, abdominal pain, diarrhea, constipation, blood in stool, abdominal distention and anal bleeding.  Genitourinary: Negative for dysuria, hematuria, flank pain and difficulty urinating.  Musculoskeletal: Negative for myalgias, back pain, joint swelling, arthralgias and gait problem.  Skin: Positive for pallor, rash and wound. Negative for color change.  Neurological: Negative for dizziness, tremors, weakness and light-headedness.  Hematological: Negative for adenopathy. Does not  bruise/bleed easily.  Psychiatric/Behavioral: Negative for behavioral problems, confusion, sleep disturbance, dysphoric mood, decreased concentration and agitation.       Objective:   Physical Exam  Constitutional: He is oriented to person, place, and time. He appears cachectic. No distress.  HENT:  Head: Normocephalic and atraumatic.  Mouth/Throat:    Eyes: Conjunctivae and EOM are normal.  Neck: Normal range of motion. Neck supple.  Cardiovascular: Normal rate and regular rhythm.   Pulmonary/Chest: Effort normal. No respiratory distress. He has no wheezes.  Abdominal: He exhibits no distension. There is no tenderness.  Musculoskeletal: He exhibits no edema and no tenderness.       Legs: Neurological: He is alert and oriented to person, place, and time. He exhibits normal muscle tone. Coordination normal.  Skin: Skin is warm and dry. Rash noted. He is not diaphoretic. No erythema. No pallor.  Psychiatric: He has a normal mood and affect. His behavior is normal. Judgment and thought content normal.          Assessment & Plan:   HIV: AIDS drug assistance program application has been processed  Continue Prezista Norvir Truvada and also Bactrim daily for PCP prophylaxis and azithromycin weekly for Mycobacterium avium prophylaxis. Recheck blood work in approximately 3 weeks and see him in my clinic on October 31.     Rash: Per the patient is improved we'll reassess at next appointment  Thrush: Resolved after fluconazole.  Poor appetite: Responding to megestrol  Health care maintenance has had influenza vaccination  HTN: start low dose norvasc

## 2013-08-17 NOTE — Patient Instructions (Addendum)
Give appt at 915 on [id number]

## 2013-09-12 ENCOUNTER — Other Ambulatory Visit: Payer: Self-pay | Admitting: Licensed Clinical Social Worker

## 2013-09-12 DIAGNOSIS — R110 Nausea: Secondary | ICD-10-CM

## 2013-09-12 MED ORDER — ONDANSETRON HCL 4 MG PO TABS: 4.0000 mg | ORAL_TABLET | Freq: Three times a day (TID) | ORAL | Status: DC | PRN

## 2013-09-16 ENCOUNTER — Telehealth: Payer: Self-pay | Admitting: *Deleted

## 2013-09-16 ENCOUNTER — Ambulatory Visit: Payer: Self-pay | Admitting: Infectious Disease

## 2013-09-16 NOTE — Telephone Encounter (Signed)
Called patient and rescheduled his appt for 10/19/13, he no showed today. Kenneth Rivas

## 2013-10-19 ENCOUNTER — Ambulatory Visit (INDEPENDENT_AMBULATORY_CARE_PROVIDER_SITE_OTHER): Payer: Self-pay | Admitting: Infectious Disease

## 2013-10-19 ENCOUNTER — Encounter: Payer: Self-pay | Admitting: Infectious Disease

## 2013-10-19 VITALS — BP 146/93 | HR 88 | Temp 98.3°F | Wt 147.0 lb

## 2013-10-19 DIAGNOSIS — R21 Rash and other nonspecific skin eruption: Secondary | ICD-10-CM

## 2013-10-19 DIAGNOSIS — B20 Human immunodeficiency virus [HIV] disease: Secondary | ICD-10-CM

## 2013-10-19 DIAGNOSIS — M109 Gout, unspecified: Secondary | ICD-10-CM

## 2013-10-19 DIAGNOSIS — B370 Candidal stomatitis: Secondary | ICD-10-CM

## 2013-10-19 LAB — CBC WITH DIFFERENTIAL/PLATELET
Basophils Absolute: 0 10*3/uL (ref 0.0–0.1)
Basophils Relative: 1 % (ref 0–1)
Eosinophils Absolute: 0.5 10*3/uL (ref 0.0–0.7)
Eosinophils Relative: 14 % — ABNORMAL HIGH (ref 0–5)
Hemoglobin: 12.8 g/dL — ABNORMAL LOW (ref 13.0–17.0)
Lymphocytes Relative: 24 % (ref 12–46)
MCH: 28.8 pg (ref 26.0–34.0)
MCHC: 33.3 g/dL (ref 30.0–36.0)
Neutro Abs: 1.8 10*3/uL (ref 1.7–7.7)
Neutrophils Relative %: 50 % (ref 43–77)
Platelets: 281 10*3/uL (ref 150–400)
RDW: 15.9 % — ABNORMAL HIGH (ref 11.5–15.5)
WBC: 3.6 10*3/uL — ABNORMAL LOW (ref 4.0–10.5)

## 2013-10-19 MED ORDER — AZITHROMYCIN 600 MG PO TABS: 1200.0000 mg | ORAL_TABLET | ORAL | Status: DC

## 2013-10-19 MED ORDER — SULFAMETHOXAZOLE-TRIMETHOPRIM 800-160 MG PO TABS: 1.0000 | ORAL_TABLET | Freq: Every day | ORAL | Status: DC

## 2013-10-19 NOTE — Progress Notes (Signed)
  Subjective:    Patient ID: Kenneth Rivas, male    DOB: [birth year], 52 y.o.   MRN: [id number]  HPI   52 year-old [place] American man with HIV and AIDS not seen since 2012 until this Fall. . 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 I've asked that he be started on Prezista Norvir and Truvada and the AIDS drug assistance program application. We gave him prescriptions for Prezista Truvada and he was also given drug assistance program enrollment for Norvir  Until we enrolled him to ADAP. He states that he has been taking his ARVs but does not come with azithro or bactrim. 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 He is still c/o pain in his left great toe with what appears to be possible gout.    Review of Systems  Constitutional: Positive for appetite change and fatigue. Negative for fever, chills, diaphoresis, activity change and unexpected weight change.  HENT: Negative for congestion, rhinorrhea, sinus pressure, sneezing, sore throat and trouble swallowing.   Eyes: Negative for photophobia and visual disturbance.  Respiratory: Negative for cough, chest tightness, shortness of breath, wheezing and stridor.   Cardiovascular: Negative for chest pain, palpitations and leg swelling.  Gastrointestinal: Negative for nausea, vomiting, abdominal pain, diarrhea, constipation, blood in stool, abdominal distention and anal bleeding.  Genitourinary: Negative for dysuria, hematuria, flank pain and difficulty urinating.  Musculoskeletal: Positive for joint swelling. Negative for arthralgias, back pain, gait problem and myalgias.  Skin: Positive for rash. Negative for color change, pallor and wound.  Neurological: Negative for dizziness, tremors, weakness and light-headedness.  Hematological: Negative for adenopathy. Does not bruise/bleed easily.  Psychiatric/Behavioral: Negative for behavioral problems, confusion, sleep disturbance, dysphoric  mood, decreased concentration and agitation.       Objective:   Physical Exam  Constitutional: He is oriented to person, place, and time. He appears cachectic. No distress.  HENT:  Head: Normocephalic and atraumatic.  Mouth/Throat: No oropharyngeal exudate.  Eyes: Conjunctivae and EOM are normal.  Neck: Normal range of motion. Neck supple.  Cardiovascular: Normal rate and regular rhythm.   Pulmonary/Chest: Effort normal. No respiratory distress. He has no wheezes.  Abdominal: He exhibits no distension. There is no tenderness.  Musculoskeletal: He exhibits no edema and no tenderness.       Legs:      Feet:  Neurological: He is alert and oriented to person, place, and time. He exhibits normal muscle tone. Coordination normal.  Skin: Skin is warm and dry. Rash noted. He is not diaphoretic. No erythema. No pallor.  Psychiatric: He has a normal mood and affect. His behavior is normal. Judgment and thought content normal.          Assessment & Plan:   HIV: Continue Prezista Norvir Truvada and also Bactrim daily for PCP prophylaxis and azithromycin weekly for Mycobacterium avium prophylaxis. Recheck blood work today and n approximately 4 weeks and see him in my clinic in 6 weeks, Needs ADAP renewal   Rash: Per the patient is improved   Painful joint: likley gout: check uric acid, to take high dose ibuprofen if this fails consider course of prednisone  Vs referral for aspiration for cell count, diff, crystals, cultures  Thrush: Resolved after fluconazole.  Poor appetite: Responding to megestrol  Health care maintenance has had influenza vaccination

## 2013-10-20 LAB — PHOSPHORUS: Phosphorus: 3.9 mg/dL (ref 2.3–4.6)

## 2013-10-20 LAB — COMPLETE METABOLIC PANEL WITH GFR
ALT: 29 U/L (ref 0–53)
Albumin: 3.9 g/dL (ref 3.5–5.2)
CO2: 25 mEq/L (ref 19–32)
GFR, Est African American: >89 mL/min
GFR, Est Non African American: 88 mL/min
Glucose, Bld: 79 mg/dL (ref 70–99)
Potassium: 4 mEq/L (ref 3.5–5.3)
Sodium: 137 mEq/L (ref 135–145)
Total Protein: 8.2 g/dL (ref 6.0–8.3)

## 2013-10-20 LAB — URIC ACID: Uric Acid, Serum: 4.5 mg/dL (ref 4.0–7.8)

## 2013-10-21 LAB — T-HELPER CELL (CD4) - (RCID CLINIC ONLY): CD4 T Cell Abs: 30 /uL — ABNORMAL LOW (ref 400–2700)

## 2013-10-21 LAB — HIV-1 RNA ULTRAQUANT REFLEX TO GENTYP+: HIV 1 RNA Quant: 7683 copies/mL — ABNORMAL HIGH (ref <20)

## 2013-10-26 LAB — HIV-1 GENOTYPR PLUS

## 2013-10-31 ENCOUNTER — Telehealth: Payer: Self-pay | Admitting: *Deleted

## 2013-10-31 NOTE — Telephone Encounter (Signed)
Called patient to inform him that his medication arrived in office and is ready for pick up. 3 individual bottles of Norvir 100 mg, #30, lot [id number] exp [id number].  Patient will pick them up 12/16 afternoon. Andree Coss, RN

## 2013-11-30 ENCOUNTER — Other Ambulatory Visit: Payer: Self-pay | Admitting: *Deleted

## 2013-11-30 ENCOUNTER — Ambulatory Visit (INDEPENDENT_AMBULATORY_CARE_PROVIDER_SITE_OTHER): Payer: Self-pay | Admitting: Infectious Disease

## 2013-11-30 ENCOUNTER — Encounter: Payer: Self-pay | Admitting: Infectious Disease

## 2013-11-30 ENCOUNTER — Ambulatory Visit: Payer: Self-pay

## 2013-11-30 VITALS — BP 103/65 | HR 109 | Temp 97.9°F | Wt 140.5 lb

## 2013-11-30 DIAGNOSIS — J069 Acute upper respiratory infection, unspecified: Secondary | ICD-10-CM

## 2013-11-30 DIAGNOSIS — Z113 Encounter for screening for infections with a predominantly sexual mode of transmission: Secondary | ICD-10-CM

## 2013-11-30 DIAGNOSIS — Z91199 Patient's noncompliance with other medical treatment and regimen due to unspecified reason: Secondary | ICD-10-CM

## 2013-11-30 DIAGNOSIS — R05 Cough: Secondary | ICD-10-CM

## 2013-11-30 DIAGNOSIS — R059 Cough, unspecified: Secondary | ICD-10-CM

## 2013-11-30 DIAGNOSIS — B20 Human immunodeficiency virus [HIV] disease: Secondary | ICD-10-CM

## 2013-11-30 DIAGNOSIS — Z9119 Patient's noncompliance with other medical treatment and regimen: Secondary | ICD-10-CM

## 2013-11-30 DIAGNOSIS — R630 Anorexia: Secondary | ICD-10-CM

## 2013-11-30 LAB — CBC WITH DIFFERENTIAL/PLATELET
BASOS ABS: 0 10*3/uL (ref 0.0–0.1)
BASOS PCT: 1 % (ref 0–1)
EOS PCT: 15 % — AB (ref 0–5)
Eosinophils Absolute: 0.5 10*3/uL (ref 0.0–0.7)
HCT: 43.7 % (ref 39.0–52.0)
Hemoglobin: 15 g/dL (ref 13.0–17.0)
Lymphocytes Relative: 45 % (ref 12–46)
Lymphs Abs: 1.4 10*3/uL (ref 0.7–4.0)
MCH: 29.4 pg (ref 26.0–34.0)
MCHC: 34.3 g/dL (ref 30.0–36.0)
MCV: 85.5 fL (ref 78.0–100.0)
Monocytes Absolute: 0.4 10*3/uL (ref 0.1–1.0)
Monocytes Relative: 13 % — ABNORMAL HIGH (ref 3–12)
NEUTROS ABS: 0.8 10*3/uL — AB (ref 1.7–7.7)
Neutrophils Relative %: 26 % — ABNORMAL LOW (ref 43–77)
Platelets: 253 10*3/uL (ref 150–400)
RBC: 5.11 MIL/uL (ref 4.22–5.81)
RDW: 13.6 % (ref 11.5–15.5)
WBC: 3 10*3/uL — ABNORMAL LOW (ref 4.0–10.5)

## 2013-11-30 LAB — COMPLETE METABOLIC PANEL WITH GFR
ALBUMIN: 3.8 g/dL (ref 3.5–5.2)
ALT: 46 U/L (ref 0–53)
AST: 53 U/L — ABNORMAL HIGH (ref 0–37)
Alkaline Phosphatase: 67 U/L (ref 39–117)
BUN: 11 mg/dL (ref 6–23)
CHLORIDE: 99 meq/L (ref 96–112)
CO2: 25 mEq/L (ref 19–32)
Calcium: 8.8 mg/dL (ref 8.4–10.5)
Creat: 1.07 mg/dL (ref 0.50–1.35)
GFR, EST NON AFRICAN AMERICAN: 78 mL/min
GFR, Est African American: >89 mL/min
GLUCOSE: 106 mg/dL — AB (ref 70–99)
POTASSIUM: 3.6 meq/L (ref 3.5–5.3)
SODIUM: 135 meq/L (ref 135–145)
TOTAL PROTEIN: 8.6 g/dL — AB (ref 6.0–8.3)
Total Bilirubin: 0.3 mg/dL (ref 0.3–1.2)

## 2013-11-30 LAB — PHOSPHORUS: Phosphorus: 4.5 mg/dL (ref 2.3–4.6)

## 2013-11-30 LAB — RPR

## 2013-11-30 MED ORDER — EMTRICITABINE-TENOFOVIR DF 200-300 MG PO TABS: 1.0000 | ORAL_TABLET | Freq: Every day | ORAL | Status: DC

## 2013-11-30 MED ORDER — DARUNAVIR ETHANOLATE 800 MG PO TABS: 800.0000 mg | ORAL_TABLET | Freq: Every day | ORAL | Status: DC

## 2013-11-30 MED ORDER — DARUNAVIR ETHANOLATE 800 MG PO TABS
800.0000 mg | ORAL_TABLET | Freq: Every day | ORAL | Status: DC
Start: 2013-11-30 — End: 2013-12-19

## 2013-11-30 MED ORDER — RITONAVIR 100 MG PO TABS: 100.0000 mg | ORAL_TABLET | Freq: Every day | ORAL | Status: DC

## 2013-11-30 NOTE — Progress Notes (Signed)
Subjective:    Patient ID: Kenneth Rivas, male    DOB: 05/12/1961, 53 y.o.   MRN: [id number]  Cough This is a new problem. The current episode started in the past 7 days. The problem has been gradually worsening. The problem occurs every few hours. The cough is non-productive. Associated symptoms include postnasal drip and shortness of breath. Pertinent negatives include no chest pain, chills, fever, myalgias, rhinorrhea, sore throat or wheezing. The symptoms are aggravated by exercise. He has tried nothing for the symptoms.    53 year-old [place] man with HIV and AIDS not seen since 2012 until this Fall. . 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 I've asked that he be started on Prezista Norvir and Truvada and the AIDS drug assistance program application. We gave him prescriptions for Prezista Truvada and he was also given drug assistance program enrollment for Norvir  Until we enrolled him to ADAP. He states that he has been taking his ARVs but does not come with azithro or bactrim.   Reviewed his medicines today it was unclear whether he had been taking Norvir this entire time or not he just picked up 3 bottles containing a month's worth of prescription of Norvir each at the pharmacy. This makes me concerned that he has been taking on boosted Prezista with Truvada alone.  Will plan on checking his viral load and CD4 count today hopefully has not required resistance to Prezista or component of Truvada in the interim.  Is also had in the last several days some congestion with a slight nonproductive cough and no lambert action with some chest pain with deep breathing. He has no fevers nausea or other systemic symptoms.  Review of Systems  Constitutional: Positive for appetite change and fatigue. Negative for fever, chills, diaphoresis, activity change and unexpected weight change.  HENT: Positive for  postnasal drip. Negative for congestion, rhinorrhea, sinus pressure, sneezing, sore throat and trouble swallowing.   Eyes: Negative for photophobia and visual disturbance.  Respiratory: Positive for cough, chest tightness and shortness of breath. Negative for wheezing and stridor.   Cardiovascular: Negative for chest pain, palpitations and leg swelling.  Gastrointestinal: Negative for nausea, vomiting, abdominal pain, diarrhea, constipation, blood in stool, abdominal distention and anal bleeding.  Genitourinary: Negative for dysuria, hematuria, flank pain and difficulty urinating.  Musculoskeletal: Positive for joint swelling. Negative for arthralgias, back pain, gait problem and myalgias.  Skin: Negative for color change, pallor and wound.  Neurological: Negative for dizziness, tremors, weakness and light-headedness.  Hematological: Negative for adenopathy. Does not bruise/bleed easily.  Psychiatric/Behavioral: Negative for behavioral problems, confusion, sleep disturbance, dysphoric mood, decreased concentration and agitation.       Objective:   Physical Exam  Constitutional: He is oriented to person, place, and time. He appears cachectic. No distress.  HENT:  Head: Normocephalic and atraumatic.  Mouth/Throat: Oropharynx is clear and moist. No oropharyngeal exudate.  Eyes: Conjunctivae and EOM are normal.  Neck: Normal range of motion. Neck supple. No tracheal deviation present.  Cardiovascular: Normal rate, regular rhythm and normal heart sounds.  Exam reveals no friction rub.   No murmur heard. Pulmonary/Chest: Effort normal and breath sounds normal. No respiratory distress. He has no wheezes. He has no rales.  Abdominal: He exhibits no distension. There is no tenderness.  Musculoskeletal: He exhibits no edema and no tenderness.       Legs: Neurological: He is alert and oriented  to person, place, and time. He exhibits normal muscle tone. Coordination normal.  Skin: Skin is warm and  dry. He is not diaphoretic. No erythema. No pallor.  Psychiatric: He has a normal mood and affect. His behavior is normal. Judgment and thought content normal.          Assessment & Plan:   HIV: Check viral load was reflects genotype today and CD4 count Continue Prezista Norvir Truvada and also Bactrim daily for PCP prophylaxis and azithromycin weekly for Mycobacterium avium prophylaxis.Needs ADAP renewal or March.  Bring back next week to review labs.  I spent greater than 40  minutes with the patient including greater than 50% of time in face to face counsel of the patient and in coordination of their care.   Upper respiratory tract infection with some chest  Tightness:  Would continue to treat symptomatically we'll see him next week and wanted to get a chest x-ray but did not get that today again we will see how he does I doubt this is a bacterial infection.  Poor appetite: Responding to megestrol

## 2013-12-01 LAB — HIV-1 RNA ULTRAQUANT REFLEX TO GENTYP+
HIV 1 RNA Quant: 3450 copies/mL — ABNORMAL HIGH (ref <20)
HIV-1 RNA Quant, Log: 3.54 {Log} — ABNORMAL HIGH (ref <1.30)

## 2013-12-02 LAB — T-HELPER CELL (CD4) - (RCID CLINIC ONLY)
CD4 T CELL ABS: 50 /uL — AB (ref 400–2700)
CD4 T CELL HELPER: 3 % — AB (ref 33–55)

## 2013-12-08 ENCOUNTER — Ambulatory Visit: Payer: Self-pay | Admitting: Infectious Disease

## 2013-12-12 LAB — HIV-1 GENOTYPR PLUS

## 2013-12-19 ENCOUNTER — Other Ambulatory Visit: Payer: Self-pay | Admitting: Licensed Clinical Social Worker

## 2013-12-19 DIAGNOSIS — I10 Essential (primary) hypertension: Secondary | ICD-10-CM

## 2013-12-19 DIAGNOSIS — B20 Human immunodeficiency virus [HIV] disease: Secondary | ICD-10-CM

## 2013-12-19 MED ORDER — EMTRICITABINE-TENOFOVIR DF 200-300 MG PO TABS: 1.0000 | ORAL_TABLET | Freq: Every day | ORAL | Status: DC

## 2013-12-19 MED ORDER — SULFAMETHOXAZOLE-TMP DS 800-160 MG PO TABS: 1.0000 | ORAL_TABLET | Freq: Every day | ORAL | Status: DC

## 2013-12-19 MED ORDER — DARUNAVIR ETHANOLATE 800 MG PO TABS: 800.0000 mg | ORAL_TABLET | Freq: Every day | ORAL | Status: DC

## 2013-12-19 MED ORDER — RITONAVIR 100 MG PO TABS: 100.0000 mg | ORAL_TABLET | Freq: Every day | ORAL | Status: DC

## 2013-12-19 MED ORDER — AMLODIPINE BESYLATE 5 MG PO TABS
5.0000 mg | ORAL_TABLET | Freq: Every day | ORAL | Status: DC
Start: 2013-12-19 — End: 2017-07-14

## 2013-12-19 MED ORDER — AZITHROMYCIN 600 MG PO TABS: 1200.0000 mg | ORAL_TABLET | ORAL | Status: DC

## 2014-09-06 ENCOUNTER — Encounter: Payer: Self-pay | Admitting: *Deleted

## 2014-10-09 ENCOUNTER — Encounter: Payer: Self-pay | Admitting: *Deleted

## 2014-10-09 NOTE — Progress Notes (Signed)
Patient ID: Kenneth Rivas, male   DOB: [birth year], 53 y.o.   MRN: [id number] Letter sent to pt returned.  Detectable viral load.  Refer to [employer] [facility] to locate pt and help return to Kenneth Rivas.

## 2014-11-01 ENCOUNTER — Other Ambulatory Visit (INDEPENDENT_AMBULATORY_CARE_PROVIDER_SITE_OTHER): Payer: Self-pay

## 2014-11-01 ENCOUNTER — Ambulatory Visit: Payer: Self-pay

## 2014-11-01 DIAGNOSIS — B20 Human immunodeficiency virus [HIV] disease: Secondary | ICD-10-CM

## 2014-11-01 DIAGNOSIS — Z113 Encounter for screening for infections with a predominantly sexual mode of transmission: Secondary | ICD-10-CM

## 2014-11-01 LAB — CBC WITH DIFFERENTIAL/PLATELET
BASOS ABS: 0 10*3/uL (ref 0.0–0.1)
BASOS PCT: 1 % (ref 0–1)
EOS PCT: 23 % — AB (ref 0–5)
Eosinophils Absolute: 0.7 10*3/uL (ref 0.0–0.7)
HCT: 35.4 % — ABNORMAL LOW (ref 39.0–52.0)
Hemoglobin: 12.2 g/dL — ABNORMAL LOW (ref 13.0–17.0)
LYMPHS PCT: 25 % (ref 12–46)
Lymphs Abs: 0.8 10*3/uL (ref 0.7–4.0)
MCH: 28 pg (ref 26.0–34.0)
MCHC: 34.5 g/dL (ref 30.0–36.0)
MCV: 81.4 fL (ref 78.0–100.0)
MONO ABS: 0.5 10*3/uL (ref 0.1–1.0)
MPV: 11.1 fL (ref 9.4–12.4)
Monocytes Relative: 17 % — ABNORMAL HIGH (ref 3–12)
NEUTROS ABS: 1 10*3/uL — AB (ref 1.7–7.7)
Neutrophils Relative %: 34 % — ABNORMAL LOW (ref 43–77)
Platelets: 239 10*3/uL (ref 150–400)
RBC: 4.35 MIL/uL (ref 4.22–5.81)
RDW: 14.6 % (ref 11.5–15.5)
WBC: 3 10*3/uL — AB (ref 4.0–10.5)

## 2014-11-01 LAB — COMPLETE METABOLIC PANEL WITH GFR
ALT: 34 U/L (ref 0–53)
AST: 33 U/L (ref 0–37)
Albumin: 3.7 g/dL (ref 3.5–5.2)
Alkaline Phosphatase: 78 U/L (ref 39–117)
BUN: 9 mg/dL (ref 6–23)
CO2: 26 mEq/L (ref 19–32)
Calcium: 8.5 mg/dL (ref 8.4–10.5)
Chloride: 106 mEq/L (ref 96–112)
Creat: 0.92 mg/dL (ref 0.50–1.35)
GFR, Est African American: >89 mL/min
GFR, Est Non African American: >89 mL/min
Glucose, Bld: 87 mg/dL (ref 70–99)
Potassium: 3.8 mEq/L (ref 3.5–5.3)
Sodium: 138 mEq/L (ref 135–145)
Total Bilirubin: 0.3 mg/dL (ref 0.2–1.2)
Total Protein: 8.4 g/dL — ABNORMAL HIGH (ref 6.0–8.3)

## 2014-11-01 LAB — RPR

## 2014-11-02 LAB — T-HELPER CELL (CD4) - (RCID CLINIC ONLY): CD4 T Cell Abs: <10 /uL — ABNORMAL LOW (ref 400–2700)

## 2014-11-02 LAB — HIV-1 RNA QUANT-NO REFLEX-BLD
HIV 1 RNA Quant: 11896 copies/mL — ABNORMAL HIGH (ref <20)
HIV-1 RNA Quant, Log: 4.08 {Log} — ABNORMAL HIGH (ref <1.30)

## 2014-11-06 ENCOUNTER — Other Ambulatory Visit: Payer: Self-pay | Admitting: *Deleted

## 2014-11-06 DIAGNOSIS — B20 Human immunodeficiency virus [HIV] disease: Secondary | ICD-10-CM

## 2014-11-06 MED ORDER — EMTRICITABINE-TENOFOVIR DF 200-300 MG PO TABS: 1.0000 | ORAL_TABLET | Freq: Every day | ORAL | Status: DC

## 2014-11-06 MED ORDER — DARUNAVIR ETHANOLATE 800 MG PO TABS: 800.0000 mg | ORAL_TABLET | Freq: Every day | ORAL | Status: DC

## 2014-11-06 MED ORDER — SULFAMETHOXAZOLE-TRIMETHOPRIM 800-160 MG PO TABS: 1.0000 | ORAL_TABLET | Freq: Once | ORAL | Status: DC

## 2014-11-06 MED ORDER — RITONAVIR 100 MG PO TABS: 100.0000 mg | ORAL_TABLET | Freq: Every day | ORAL | Status: DC

## 2014-11-10 LAB — HLA B*5701: HLA-B*5701 w/rflx HLA-B High: NEGATIVE

## 2014-11-21 ENCOUNTER — Other Ambulatory Visit: Payer: Self-pay | Admitting: *Deleted

## 2014-11-21 ENCOUNTER — Ambulatory Visit: Payer: Self-pay

## 2014-11-21 DIAGNOSIS — B20 Human immunodeficiency virus [HIV] disease: Secondary | ICD-10-CM

## 2014-11-21 MED ORDER — EMTRICITABINE-TENOFOVIR DF 200-300 MG PO TABS: 1.0000 | ORAL_TABLET | Freq: Every day | ORAL | Status: DC

## 2014-11-21 MED ORDER — RITONAVIR 100 MG PO TABS: 100.0000 mg | ORAL_TABLET | Freq: Every day | ORAL | Status: DC

## 2014-11-21 MED ORDER — DARUNAVIR ETHANOLATE 800 MG PO TABS: 800.0000 mg | ORAL_TABLET | Freq: Every day | ORAL | Status: DC

## 2014-11-21 NOTE — Telephone Encounter (Signed)
ADAP Application 

## 2014-12-06 ENCOUNTER — Ambulatory Visit (INDEPENDENT_AMBULATORY_CARE_PROVIDER_SITE_OTHER): Payer: Self-pay | Admitting: Infectious Disease

## 2014-12-06 ENCOUNTER — Encounter: Payer: Self-pay | Admitting: Infectious Disease

## 2014-12-06 VITALS — BP 149/85 | HR 77 | Temp 97.5°F | Wt 149.0 lb

## 2014-12-06 DIAGNOSIS — B20 Human immunodeficiency virus [HIV] disease: Secondary | ICD-10-CM

## 2014-12-06 DIAGNOSIS — Z23 Encounter for immunization: Secondary | ICD-10-CM

## 2014-12-06 DIAGNOSIS — B181 Chronic viral hepatitis B without delta-agent: Secondary | ICD-10-CM

## 2014-12-06 MED ORDER — SULFAMETHOXAZOLE-TRIMETHOPRIM 800-160 MG PO TABS: 1.0000 | ORAL_TABLET | Freq: Once | ORAL | Status: DC

## 2014-12-06 MED ORDER — EMTRICITABINE-TENOFOVIR DF 200-300 MG PO TABS: 1.0000 | ORAL_TABLET | Freq: Every day | ORAL | Status: DC

## 2014-12-06 MED ORDER — DOLUTEGRAVIR SODIUM 50 MG PO TABS: 50.0000 mg | ORAL_TABLET | Freq: Every day | ORAL | Status: DC

## 2014-12-06 MED ORDER — TRIAMCINOLONE ACETONIDE 0.5 % EX OINT: TOPICAL_OINTMENT | Freq: Two times a day (BID) | CUTANEOUS | Status: DC

## 2014-12-06 NOTE — Progress Notes (Signed)
  Subjective:    Patient ID: Kenneth Rivas, male    DOB: 1961-04-17, 54 y.o.   MRN: [id number]  HPI  54 year-old [place] man with HIV and AIDS in and out of care since 2012. 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  We had restarted him on prezista, norvir and truvada but he has never been compliant with this regimen reliably. He apparently just restarted in the last two weeks and is having abdominal pain, diarrhea and wonders if "the dose needs to be reduced."   His CD4 is now <10.  I am going to change him to Tivicay and Truvada today. 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  Review of Systems  Constitutional: Positive for fatigue. Negative for diaphoresis, activity change and unexpected weight change.  HENT: Negative for congestion, sinus pressure, sneezing and trouble swallowing.   Eyes: Negative for photophobia and visual disturbance.  Respiratory: Negative for stridor.   Cardiovascular: Negative for palpitations and leg swelling.  Gastrointestinal: Negative for nausea, vomiting, abdominal pain, diarrhea, constipation, blood in stool, abdominal distention and anal bleeding.  Genitourinary: Negative for dysuria, hematuria, flank pain and difficulty urinating.  Musculoskeletal: Negative for back pain, arthralgias and gait problem.  Skin: Positive for rash. Negative for color change, pallor and wound.  Neurological: Negative for dizziness, tremors, weakness and light-headedness.  Hematological: Negative for adenopathy. Does not bruise/bleed easily.  Psychiatric/Behavioral: Negative for behavioral problems, confusion, sleep disturbance, dysphoric mood, decreased concentration and agitation.       Objective:   Physical Exam  Constitutional: He is oriented to person, place, and time. He appears cachectic. No distress.  HENT:  Head: Normocephalic  and atraumatic.  Mouth/Throat: Oropharynx is clear and moist. No oropharyngeal exudate.  Eyes: Conjunctivae and EOM are normal.  Neck: Normal range of motion. Neck supple. No tracheal deviation present.  Cardiovascular: Normal rate, regular rhythm and normal heart sounds.  Exam reveals no friction rub.   No murmur heard. Pulmonary/Chest: Effort normal and breath sounds normal. No respiratory distress. He has no wheezes. He has no rales.  Abdominal: He exhibits no distension. There is no tenderness.  Musculoskeletal: He exhibits no edema or tenderness.       Legs: Neurological: He is alert and oriented to person, place, and time. He exhibits normal muscle tone. Coordination normal.  Skin: Skin is warm and dry. He is not diaphoretic. No erythema. No pallor.  Psychiatric: He has a normal mood and affect. His behavior is normal. Judgment and thought content normal.          Assessment & Plan:   HIV: Change to Tivicay and Truvada, recheck labs in one month.    I spent greater than 40  minutes with the patient including greater than 50% of time in face to face counsel of the patient and in coordination of their care.  I had Kenneth Rivas my pharmacist also review his new regimen with him.   Rash: start triamcinolone

## 2014-12-06 NOTE — Progress Notes (Signed)
Patient ID: Kenneth Rivas, male   DOB: Jul 25, 1961, 54 y.o.   MRN: [id number] HPI: Kenneth Rivas is a 54 y.o. male who is here for the f/u of HIV.   Allergies: No Known Allergies  Vitals: Temp: 97.5 F (36.4 C) (01/20 1003) Temp Source: Oral (01/20 1003) BP: 149/85 mmHg (01/20 1003) Pulse Rate: 77 (01/20 1003)  Past Medical History: Past Medical History  Diagnosis Date  . Hypertension   . Hepatitis B   . Immune deficiency disorder   . HIV disease   . AIDS   . Anemia   . Anxiety     Social History: History   Social History  . Marital Status: Married    Spouse Name: N/A    Number of Children: N/A  . Years of Education: N/A   Social History Main Topics  . Smoking status: Current Some Day Smoker -- 0.30 packs/day    Types: Cigarettes  . Smokeless tobacco: Never Used     Comment: pt. given quit line information  . Alcohol Use: 2.0 oz/week    4 drink(s) per week     Comment: beer  . 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 no h/o IVDU, other illicits  . Sexual Activity:    Partners: Male     Comment: patient declined condoms   Other Topics Concern  . None   Social History Narrative    Previous Regimen:   Current Regimen: DRV/r + TRV  Labs: HIV 1 RNA QUANT (copies/mL)  Date Value  11/01/2014 11896*  11/30/2013 3450*  10/19/2013 7683*   CD4 T CELL ABS (/uL)  Date Value  11/01/2014 <10*  11/30/2013 50*  10/19/2013 30*   HEP B S AB (no units)  Date Value  01/11/2007 NO   HEPATITIS B SURFACE AG (no units)  Date Value  01/11/2007 NO   HCV AB (no units)  Date Value  01/11/2007 NO    CrCl: CrCl cannot be calculated (Unknown ideal weight.).  Lipids:    Component Value Date/Time   CHOL 152 09/16/2010 2022   TRIG 57 09/16/2010 2022   HDL 48 09/16/2010 2022   CHOLHDL 3.2 Ratio 09/16/2010 2022   VLDL 11 09/16/2010 2022   LDLCALC 93 09/16/2010 2022    Assessment:  54 yo who is here for his HIV f/u. He has been very  non-compliance. We had discussion with him and is changing him to DTG and TRV. We really tried to reinforce compliance. Discussed all the potential side effects with him.   Recommendations:  Dc DRV/r Start DTG 50mg  PO qday Cont TRV 1 PO qday F/u with VL  Clide CliffPham, Lylla Eifler Quang, PharmD Clinical Infectious Disease Pharmacist [facility] 12/06/2014, 11:41 AM

## 2015-01-15 ENCOUNTER — Encounter: Payer: Self-pay | Admitting: Infectious Disease

## 2015-01-15 ENCOUNTER — Ambulatory Visit (INDEPENDENT_AMBULATORY_CARE_PROVIDER_SITE_OTHER): Payer: Self-pay | Admitting: Infectious Disease

## 2015-01-15 VITALS — BP 146/87 | HR 85 | Temp 98.2°F | Wt 140.0 lb

## 2015-01-15 DIAGNOSIS — B181 Chronic viral hepatitis B without delta-agent: Secondary | ICD-10-CM

## 2015-01-15 DIAGNOSIS — L731 Pseudofolliculitis barbae: Secondary | ICD-10-CM

## 2015-01-15 DIAGNOSIS — I10 Essential (primary) hypertension: Secondary | ICD-10-CM

## 2015-01-15 DIAGNOSIS — B20 Human immunodeficiency virus [HIV] disease: Secondary | ICD-10-CM

## 2015-01-15 DIAGNOSIS — D7218 Eosinophilia in diseases classified elsewhere: Secondary | ICD-10-CM

## 2015-01-15 MED ORDER — TRIAMCINOLONE ACETONIDE 0.5 % EX OINT: TOPICAL_OINTMENT | Freq: Two times a day (BID) | CUTANEOUS | Status: DC

## 2015-01-15 NOTE — Progress Notes (Signed)
  Subjective:    Patient ID: Kenneth Rivas, male    DOB: [birth year], 54 y.o.   MRN: [id number]  Rash This is a chronic problem. The current episode started more than 1 month ago. The rash is diffuse. Associated symptoms include fatigue. Pertinent negatives include no congestion, diarrhea or vomiting. Past treatments include topical steroids. The treatment provided moderate relief.    54 year-old [place] man with HIV and AIDS in and out of care since 2012. 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  We had restarted him on prezista, norvir and truvada but he has never been compliant with this regimen reliably. He apparently just restarted in January with abdominal pain, diarrhea and wonders if "the dose needs to be reduced."   His CD4 was now <10.  I changed himto Tivicay and Truvada today. 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  Review of Systems  Constitutional: Positive for fatigue. Negative for diaphoresis, activity change and unexpected weight change.  HENT: Negative for congestion, sinus pressure, sneezing and trouble swallowing.   Eyes: Negative for photophobia and visual disturbance.  Respiratory: Negative for stridor.   Cardiovascular: Negative for palpitations and leg swelling.  Gastrointestinal: Negative for nausea, vomiting, abdominal pain, diarrhea, constipation, blood in stool, abdominal distention and anal bleeding.  Genitourinary: Negative for dysuria, hematuria, flank pain and difficulty urinating.  Musculoskeletal: Negative for back pain, arthralgias and gait problem.  Skin: Positive for rash. Negative for color change, pallor and wound.  Neurological: Negative for dizziness, tremors, weakness and light-headedness.  Hematological: Negative for adenopathy. Does not bruise/bleed easily.    Psychiatric/Behavioral: Negative for behavioral problems, confusion, sleep disturbance, dysphoric mood, decreased concentration and agitation.       Objective:   Physical Exam  Constitutional: He is oriented to person, place, and time. He appears cachectic. No distress.  HENT:  Head: Normocephalic and atraumatic.  Mouth/Throat: Oropharynx is clear and moist. No oropharyngeal exudate.  Eyes: Conjunctivae and EOM are normal.  Neck: Normal range of motion. Neck supple. No tracheal deviation present.  Cardiovascular: Normal rate, regular rhythm and normal heart sounds.  Exam reveals no friction rub.   No murmur heard. Pulmonary/Chest: Effort normal and breath sounds normal. No respiratory distress. He has no wheezes. He has no rales.  Abdominal: He exhibits no distension. There is no tenderness.  Musculoskeletal: He exhibits no edema or tenderness.       Legs: Neurological: He is alert and oriented to person, place, and time. He exhibits normal muscle tone. Coordination normal.  Skin: Skin is warm and dry. He is not diaphoretic. No erythema. No pallor.  Psychiatric: He has a normal mood and affect. His behavior is normal. Judgment and thought content normal.          Assessment & Plan:   HIV: Continue Tivicay and Truvada, recheck labs today  I spent greater than 25   minutes with the patient including greater than 50% of time in face to face counsel of the patient and in coordination of their care.  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

## 2015-01-16 LAB — CBC WITH DIFFERENTIAL/PLATELET
BASOS ABS: 0 10*3/uL (ref 0.0–0.1)
Basophils Relative: 1 % (ref 0–1)
Eosinophils Absolute: 0.5 10*3/uL (ref 0.0–0.7)
Eosinophils Relative: 21 % — ABNORMAL HIGH (ref 0–5)
HCT: 38.3 % — ABNORMAL LOW (ref 39.0–52.0)
Hemoglobin: 13.1 g/dL (ref 13.0–17.0)
Lymphocytes Relative: 35 % (ref 12–46)
Lymphs Abs: 0.8 10*3/uL (ref 0.7–4.0)
MCH: 29 pg (ref 26.0–34.0)
MCHC: 34.2 g/dL (ref 30.0–36.0)
MCV: 84.9 fL (ref 78.0–100.0)
MONOS PCT: 14 % — AB (ref 3–12)
MPV: 10.8 fL (ref 8.6–12.4)
Monocytes Absolute: 0.3 10*3/uL (ref 0.1–1.0)
NEUTROS ABS: 0.6 10*3/uL — AB (ref 1.7–7.7)
Neutrophils Relative %: 29 % — ABNORMAL LOW (ref 43–77)
Platelets: 200 10*3/uL (ref 150–400)
RBC: 4.51 MIL/uL (ref 4.22–5.81)
RDW: 15.2 % (ref 11.5–15.5)
WBC: 2.2 10*3/uL — AB (ref 4.0–10.5)

## 2015-01-16 LAB — COMPLETE METABOLIC PANEL WITH GFR
ALBUMIN: 3.5 g/dL (ref 3.5–5.2)
ALT: 48 U/L (ref 0–53)
AST: 46 U/L — ABNORMAL HIGH (ref 0–37)
Alkaline Phosphatase: 87 U/L (ref 39–117)
BUN: 7 mg/dL (ref 6–23)
CO2: 24 meq/L (ref 19–32)
Calcium: 8.6 mg/dL (ref 8.4–10.5)
Chloride: 102 mEq/L (ref 96–112)
Creat: 0.88 mg/dL (ref 0.50–1.35)
GFR, Est African American: >89 mL/min
GLUCOSE: 111 mg/dL — AB (ref 70–99)
Potassium: 3.4 mEq/L — ABNORMAL LOW (ref 3.5–5.3)
SODIUM: 138 meq/L (ref 135–145)
TOTAL PROTEIN: 8.3 g/dL (ref 6.0–8.3)
Total Bilirubin: 0.5 mg/dL (ref 0.2–1.2)

## 2015-01-16 LAB — T-HELPER CELL (CD4) - (RCID CLINIC ONLY)
CD4 % Helper T Cell: 1 % — ABNORMAL LOW (ref 33–55)
CD4 T Cell Abs: <10 /uL — ABNORMAL LOW (ref 400–2700)

## 2015-01-16 LAB — HIV-1 RNA ULTRAQUANT REFLEX TO GENTYP+
HIV 1 RNA QUANT: 14034 {copies}/mL — AB (ref <20)
HIV-1 RNA Quant, Log: 4.15 {Log} — ABNORMAL HIGH (ref <1.30)

## 2015-01-16 LAB — PATHOLOGIST SMEAR REVIEW

## 2015-01-23 LAB — HIV-1 INTEGRASE GENOTYPE

## 2015-01-24 LAB — HIV-1 GENOTYPR PLUS

## 2015-02-14 ENCOUNTER — Ambulatory Visit: Payer: Self-pay | Admitting: Infectious Disease

## 2015-02-26 ENCOUNTER — Encounter: Payer: Self-pay | Admitting: *Deleted

## 2015-02-26 NOTE — Progress Notes (Signed)
Several attempted calls have been made to schedule [facility]. Number listed of [phone] is not a working number. RN traveled to [facility] and [facility] to see if they know the common whereabouts of the patient. Pt is not a regular at [facility] and Raynelle FanningJulie with [facility] states the pt gets food delivers and the listed address is on [address]. Spoke with Tammy(Clinic RN Lead) at [facility] and she stated it may be a great idea to have a ParamedicBridge Counselor accompany you with making a unplanned visit to this residents home. Spoke with Cassandra who agreed to accompany me on a drive-by trip to the patient's home. At 10:15am travel began to the patient's listed home address of [address] [place]Cassandra and I arrived in the location at 10:30 and noted a home is not present at the address of [address]. The lot appears to have been cleared of a home previously.  F/u with the clinic to make them aware that the address in incorrect. At this time the referral is still open until the pt makes contact with the clinic. While reviewing the chart it is noted that the patient was a no show for his last appt on 02/14/15.

## 2015-07-12 ENCOUNTER — Other Ambulatory Visit: Payer: Self-pay | Admitting: *Deleted

## 2015-07-25 ENCOUNTER — Ambulatory Visit: Payer: Self-pay | Admitting: *Deleted

## 2015-07-25 DIAGNOSIS — B20 Human immunodeficiency virus [HIV] disease: Secondary | ICD-10-CM

## 2015-07-30 NOTE — Patient Instructions (Signed)
Please refer to progress note for the details of this note

## 2015-07-30 NOTE — Progress Notes (Signed)
RN attempted a home visit with the patient today. RN traveled to the patient's home and his friend/roommate(Kenneth Rivas) was noted to be sitting on the front porch. [name] stated Kenneth Rivas is out cutting grass at this time. RN offered my services to both men. [name] stated Kenneth Rivas really needs the help because he is not taking his medication and has what appears to be calliflower in his mouth. RN instructed Mr. Gaspar Rivas that it is most likely yeast and probably signifies a low CD4. Mr. Gaspar Rivas verbalized a understanding and stated Kenneth Rivas also has not been feeling well with a terrible case of diarrhea. [name] stated Kenneth Rivas will not want help but really needs it. Mr. Gaspar Rivas stated he will have a conversation with Kenneth Rivas once he returns home about the need for my services and then call me tomorrow. RN verbalized understanding and informed Mr. Gaspar Rivas that I want to help keep Kenneth Rivas out of the hospital.

## 2016-08-07 ENCOUNTER — Ambulatory Visit: Payer: Self-pay | Admitting: Infectious Disease

## 2017-06-11 ENCOUNTER — Emergency Department (HOSPITAL_COMMUNITY)
Admission: EM | Admit: 2017-06-11 | Discharge: 2017-06-11 | Discharge status: Against Medical Advice | Payer: Medicaid Other | Attending: Emergency Medicine | Admitting: Emergency Medicine

## 2017-06-11 ENCOUNTER — Emergency Department (HOSPITAL_COMMUNITY): Payer: Medicaid Other

## 2017-06-11 ENCOUNTER — Encounter (HOSPITAL_COMMUNITY): Payer: Self-pay

## 2017-06-11 DIAGNOSIS — R64 Cachexia: Secondary | ICD-10-CM | POA: Insufficient documentation

## 2017-06-11 DIAGNOSIS — Z5329 Procedure and treatment not carried out because of patient's decision for other reasons: Secondary | ICD-10-CM

## 2017-06-11 DIAGNOSIS — B20 Human immunodeficiency virus [HIV] disease: Secondary | ICD-10-CM | POA: Diagnosis not present

## 2017-06-11 DIAGNOSIS — E871 Hypo-osmolality and hyponatremia: Secondary | ICD-10-CM

## 2017-06-11 DIAGNOSIS — I10 Essential (primary) hypertension: Secondary | ICD-10-CM | POA: Insufficient documentation

## 2017-06-11 DIAGNOSIS — R531 Weakness: Secondary | ICD-10-CM | POA: Diagnosis present

## 2017-06-11 DIAGNOSIS — E876 Hypokalemia: Secondary | ICD-10-CM | POA: Insufficient documentation

## 2017-06-11 DIAGNOSIS — F17210 Nicotine dependence, cigarettes, uncomplicated: Secondary | ICD-10-CM | POA: Insufficient documentation

## 2017-06-11 DIAGNOSIS — Z79899 Other long term (current) drug therapy: Secondary | ICD-10-CM | POA: Insufficient documentation

## 2017-06-11 DIAGNOSIS — Z5320 Procedure and treatment not carried out because of patient's decision for unspecified reasons: Secondary | ICD-10-CM

## 2017-06-11 LAB — RAPID URINE DRUG SCREEN, HOSP PERFORMED
AMPHETAMINES: NOT DETECTED
BARBITURATES: NOT DETECTED
BENZODIAZEPINES: NOT DETECTED
COCAINE: POSITIVE — AB
Opiates: NOT DETECTED
Tetrahydrocannabinol: NOT DETECTED

## 2017-06-11 LAB — CBC
HCT: 37.2 % — ABNORMAL LOW (ref 39.0–52.0)
Hemoglobin: 12.1 g/dL — ABNORMAL LOW (ref 13.0–17.0)
MCH: 26.6 pg (ref 26.0–34.0)
MCHC: 32.5 g/dL (ref 30.0–36.0)
MCV: 81.8 fL (ref 78.0–100.0)
Platelets: 162 10*3/uL (ref 150–400)
RBC: 4.55 MIL/uL (ref 4.22–5.81)
RDW: 13.7 % (ref 11.5–15.5)
WBC: 3 10*3/uL — ABNORMAL LOW (ref 4.0–10.5)

## 2017-06-11 LAB — BASIC METABOLIC PANEL
ANION GAP: 11 (ref 5–15)
BUN: 13 mg/dL (ref 6–20)
CALCIUM: 9.1 mg/dL (ref 8.9–10.3)
CHLORIDE: 90 mmol/L — AB (ref 101–111)
CO2: 27 mmol/L (ref 22–32)
CREATININE: 0.74 mg/dL (ref 0.61–1.24)
GFR calc Af Amer: >60 mL/min (ref >60)
Glucose, Bld: 141 mg/dL — ABNORMAL HIGH (ref 65–99)
POTASSIUM: 3 mmol/L — AB (ref 3.5–5.1)
SODIUM: 128 mmol/L — AB (ref 135–145)

## 2017-06-11 LAB — HEPATIC FUNCTION PANEL
ALK PHOS: 60 U/L (ref 38–126)
ALT: 14 U/L — ABNORMAL LOW (ref 17–63)
AST: 21 U/L (ref 15–41)
Albumin: 3 g/dL — ABNORMAL LOW (ref 3.5–5.0)
BILIRUBIN DIRECT: 0.2 mg/dL (ref 0.1–0.5)
BILIRUBIN INDIRECT: 0.3 mg/dL (ref 0.3–0.9)
TOTAL PROTEIN: 9.2 g/dL — AB (ref 6.5–8.1)
Total Bilirubin: 0.5 mg/dL (ref 0.3–1.2)

## 2017-06-11 LAB — URINALYSIS, ROUTINE W REFLEX MICROSCOPIC
Bilirubin Urine: NEGATIVE
GLUCOSE, UA: NEGATIVE mg/dL
Hgb urine dipstick: NEGATIVE
Ketones, ur: 20 mg/dL — AB
Leukocytes, UA: NEGATIVE
NITRITE: NEGATIVE
PH: 5 (ref 5.0–8.0)
PROTEIN: 100 mg/dL — AB
Specific Gravity, Urine: 1.032 — ABNORMAL HIGH (ref 1.005–1.030)

## 2017-06-11 LAB — MAGNESIUM: Magnesium: 2 mg/dL (ref 1.7–2.4)

## 2017-06-11 LAB — LIPASE, BLOOD: LIPASE: 27 U/L (ref 11–51)

## 2017-06-11 LAB — PROTIME-INR
INR: 1.09
Prothrombin Time: 14.2 seconds (ref 11.4–15.2)

## 2017-06-11 LAB — PHOSPHORUS: Phosphorus: 3.5 mg/dL (ref 2.5–4.6)

## 2017-06-11 MED ORDER — SODIUM CHLORIDE 0.9 % IV SOLN
Freq: Once | INTRAVENOUS | Status: AC
  Administered 2017-06-11: 17:00:00 via INTRAVENOUS

## 2017-06-11 MED ORDER — POTASSIUM CHLORIDE CRYS ER 20 MEQ PO TBCR: 20.0000 meq | EXTENDED_RELEASE_TABLET | Freq: Two times a day (BID) | ORAL | 0 refills | Status: DC

## 2017-06-11 NOTE — ED Provider Notes (Signed)
[facility]-EMERGENCY DEPT Provider Note   CSN: [id number] Arrival date & time: 06/11/17  1228     History   Chief Complaint Chief Complaint  Patient presents with  . Weakness    HPI Kenneth Rivas is a 56 y.o. male.  HPI Patient has history of HIV and hepatitis C. Patient has not been taking any treatment for these conditions. Review of EMR indicates that he has been seen by infectious disease and attempts were made to engage him in a treatment plan. Patient states he can't really explain why he never took the treatment offered. For months he has been getting increasing generalized weakness. He is mostly confined to bed and gets up very little. His friend who assists him reports that he has fallen several times. The patient reports that he doesn't eat much. He reports he just doesn't have an appetite. His muscles and upper body have been aching for weeks. Eyes any documented fever. He denies any significant cough or mucus. He denies shortness of breath. He denies diarrhea. Past Medical History:  Diagnosis Date  . AIDS (HCC)   . Anemia   . Anxiety   . Hepatitis B   . HIV disease (HCC)   . Hypertension   . Immune deficiency disorder [facility](HCC)     Patient Active Problem List   Diagnosis Date Noted  . 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 HTN (hypertension) 08/17/2013  . Hepatitis B   . HIV disease (HCC)   . ELEVATED BLOOD PRESSURE 06/12/2010  . TB SKIN TEST, POSITIVE 01/12/2009  . Human immunodeficiency virus (HIV) disease (HCC) 12/28/2008    Past Surgical History:  Procedure Laterality Date  . HERNIA REPAIR         Home Medications    Prior to Admission medications   Medication Sig Start Date End Date Taking? Authorizing Provider  amLODipine (NORVASC) 5 MG tablet Take 1 tablet (5 mg total) by mouth daily. Patient not taking: Reported on 06/11/2017 12/19/13   Daiva EvesVan Dam, Lisette Grinderornelius N, MD  azithromycin (ZITHROMAX) 600 MG tablet Take 2 tablets (1,200 mg total) by mouth every 7  (seven) days. Patient not taking: Reported on 06/11/2017 12/19/13   Daiva EvesVan Dam, Lisette Grinderornelius N, MD  dolutegravir (TIVICAY) 50 MG tablet Take 1 tablet (50 mg total) by mouth daily. Patient not taking: Reported on 06/11/2017 12/06/14   Daiva EvesVan Dam, Lisette Grinderornelius N, MD  emtricitabine-tenofovir (TRUVADA) 200-300 MG per tablet Take 1 tablet by mouth daily. Patient not taking: Reported on 06/11/2017 12/06/14   Daiva EvesVan Dam, Lisette Grinderornelius N, MD  fluconazole (DIFLUCAN) 100 MG tablet Take 1 tablet (100 mg total) by mouth daily. Patient not taking: Reported on 06/11/2017 08/08/13   Daiva EvesVan Dam, Lisette Grinderornelius N, MD  HYDROcodone-acetaminophen (NORCO/VICODIN) 5-325 MG per tablet Take 1-2 tablets by mouth every 4 (four) hours as needed for pain. Patient not taking: Reported on 06/11/2017 07/13/13   Marlon PelGreene, Tiffany, PA-C  megestrol (MEGACE ES) 625 MG/5ML suspension Take 5 mLs (625 mg total) by mouth daily. Patient not taking: Reported on 06/11/2017 07/27/13   Daiva EvesVan Dam, Lisette Grinderornelius N, MD  naproxen (NAPROSYN) 375 MG tablet Take 1 tablet (375 mg total) by mouth 2 (two) times daily. Patient not taking: Reported on 06/11/2017 07/13/13   Marlon PelGreene, Tiffany, PA-C  ondansetron (ZOFRAN) 4 MG tablet Take 1 tablet (4 mg total) by mouth every 8 (eight) hours as needed for nausea. Patient not taking: Reported on 06/11/2017 09/12/13   Daiva EvesVan Dam, Lisette Grinderornelius N, MD  potassium chloride SA (K-DUR,KLOR-CON) 20 MEQ tablet Take 1  tablet (20 mEq total) by mouth 2 (two) times daily. 06/11/17   Arby BarrettePfeiffer, Lavaun Greenfield, MD  sulfamethoxazole-trimethoprim (BACTRIM DS,SEPTRA DS) 800-160 MG per tablet Take 1 tablet by mouth once. Patient not taking: Reported on 06/11/2017 12/06/14   Daiva EvesVan Dam, Lisette Grinderornelius N, MD  triamcinolone ointment (KENALOG) 0.5 % Apply topically 2 (two) times daily. Patient not taking: Reported on 06/11/2017 01/15/15   Daiva EvesVan Dam, Lisette Grinderornelius N, MD    Family History Family History  Problem Relation Age of Onset  . Hypertension Mother     Social History Social History  Substance Use  Topics  . Smoking status: Current Some Day Smoker    Packs/day: 0.30    Types: Cigarettes  . Smokeless tobacco: Never Used     Comment: pt. given quit line information  . Alcohol use 2.0 oz/week    4 Standard drinks or equivalent per week     Comment: beer     Allergies   Patient has no known allergies.   Review of Systems Review of Systems 10 Systems reviewed and are negative for acute change except as noted in the HPI.   Physical Exam Updated Vital Signs BP (!) 173/76 (BP Location: Right Arm)   Pulse 63   Temp 98 F (36.7 C) (Oral)   Resp 18   Ht 5\' 5"  (1.651 m)   Wt 49.9 kg (110 lb)   SpO2 99%   BMI 18.30 kg/m   Physical Exam  Constitutional:  Patient is alert and nontoxic. He does not have respiratory distress at rest. Patient has cachexia.  HENT:  Head: Normocephalic and atraumatic.     ED Treatments / Results  Labs (all labs ordered are listed, but only abnormal results are displayed) Labs Reviewed  BASIC METABOLIC PANEL - Abnormal; Notable for the following:       Result Value   Sodium 128 (*)    Potassium 3.0 (*)    Chloride 90 (*)    Glucose, Bld 141 (*)    All other components within normal limits  CBC - Abnormal; Notable for the following:    WBC 3.0 (*)    Hemoglobin 12.1 (*)    HCT 37.2 (*)    All other components within normal limits  URINALYSIS, ROUTINE W REFLEX MICROSCOPIC - Abnormal; Notable for the following:    Color, Urine AMBER (*)    Specific Gravity, Urine 1.032 (*)    Ketones, ur 20 (*)    Protein, ur 100 (*)    Bacteria, UA RARE (*)    Squamous Epithelial / LPF 0-5 (*)    All other components within normal limits  RAPID URINE DRUG SCREEN, HOSP PERFORMED - Abnormal; Notable for the following:    Cocaine POSITIVE (*)    All other components within normal limits  HEPATIC FUNCTION PANEL - Abnormal; Notable for the following:    Total Protein 9.2 (*)    Albumin 3.0 (*)    ALT 14 (*)    All other components within normal  limits  LIPASE, BLOOD  PHOSPHORUS  PROTIME-INR  MAGNESIUM  T-HELPER CELLS (CD4) COUNT (NOT AT [facility])  HIV 1 RNA QUANT-NO REFLEX-BLD  I-STAT CG4 LACTIC ACID, ED    EKG  EKG Interpretation None       Radiology Dg Abd Acute W/chest  Result Date: 06/11/2017 CLINICAL DATA:  Generalized weakness in body aches for few weeks. EXAM: DG ABDOMEN ACUTE W/ 1V CHEST COMPARISON:  None. FINDINGS: There is no evidence of dilated bowel loops or  free intraperitoneal air. No radiopaque calculi or other significant radiographic abnormality is seen. Heart size and mediastinal contours are within normal limits. Both lungs are clear. IMPRESSION: Negative abdominal radiographs.  No acute cardiopulmonary disease. Electronically Signed   By: Sherian Rein M.D.   On: 06/11/2017 18:12    Procedures Procedures (including critical care time)  Medications Ordered in ED Medications  0.9 %  sodium chloride infusion ( Intravenous Stopped 06/11/17 1900)     Initial Impression / Assessment and Plan / ED Course  I have reviewed the triage vital signs and the nursing notes.  Pertinent labs & imaging results that were available during my care of the patient were reviewed by me and considered in my medical decision making (see chart for details).     Consultation: Case reviewed with internal medicine teaching service for admission. Updated the patient leaving AMA.  Final Clinical Impressions(s) / ED Diagnoses   Final diagnoses:  Hypokalemia  Hyponatremia  Cachexia associated with AIDS (HCC)  Left against medical advice   Patient is alert and nontoxic. He does not show any evidence of cognitive dysfunction. He has capacity for medical decision making. Patient's friends are very upset with him for refusing admission and ongoing treatment for HIV\AIDS. Patient cannot explain why he has made this choice not to be compliant with any treatment. Patient is advised that he is dying from his disease and treatment might  reverse the course. Patient reports he will pursue outpatient treatment but refuses to be hospitalized. New Prescriptions New Prescriptions   POTASSIUM CHLORIDE SA (K-DUR,KLOR-CON) 20 MEQ TABLET    Take 1 tablet (20 mEq total) by mouth 2 (two) times daily.     Arby Barrette, MD 06/11/17 [phone]

## 2017-06-11 NOTE — ED Triage Notes (Addendum)
Per Pt and family, Pt is coming from home with complaints of generalized weakness and body aches for a few weeks. Pt has Hx of HIV and Hep C. Reports being unable to eat well for the last couple weeks. Denies N/V/D or CP.

## 2017-06-11 NOTE — ED Notes (Signed)
Pt made aware of need for urine. Pt stated he could not go at this time, urinal at bedside.

## 2017-06-11 NOTE — Discharge Instructions (Signed)
You're leaving AGAINST MEDICAL ADVICE. You have untreated HIV packs/AIDS. Your condition is progressively worsening. You're dying without treatment. You should see your infectious disease doctor, Dr. Algis LimingVandam as soon as possible.

## 2017-06-12 LAB — T-HELPER CELLS (CD4) COUNT (NOT AT ARMC)

## 2017-06-16 ENCOUNTER — Inpatient Hospital Stay (HOSPITAL_COMMUNITY)
Admission: EM | Admit: 2017-06-16 | Discharge: 2017-07-14 | DRG: 974 | Disposition: A | Discharge status: Home / Self Care | Payer: Medicaid Other | Attending: Family Medicine | Admitting: Family Medicine

## 2017-06-16 ENCOUNTER — Encounter (HOSPITAL_COMMUNITY): Payer: Self-pay | Admitting: *Deleted

## 2017-06-16 ENCOUNTER — Emergency Department (HOSPITAL_COMMUNITY): Payer: Medicaid Other

## 2017-06-16 DIAGNOSIS — M1990 Unspecified osteoarthritis, unspecified site: Secondary | ICD-10-CM | POA: Diagnosis present

## 2017-06-16 DIAGNOSIS — F1510 Other stimulant abuse, uncomplicated: Secondary | ICD-10-CM | POA: Diagnosis present

## 2017-06-16 DIAGNOSIS — J851 Abscess of lung with pneumonia: Secondary | ICD-10-CM | POA: Diagnosis present

## 2017-06-16 DIAGNOSIS — R000 Tachycardia, unspecified: Secondary | ICD-10-CM | POA: Diagnosis present

## 2017-06-16 DIAGNOSIS — I10 Essential (primary) hypertension: Secondary | ICD-10-CM | POA: Diagnosis present

## 2017-06-16 DIAGNOSIS — L03113 Cellulitis of right upper limb: Secondary | ICD-10-CM | POA: Diagnosis present

## 2017-06-16 DIAGNOSIS — G9341 Metabolic encephalopathy: Secondary | ICD-10-CM | POA: Diagnosis not present

## 2017-06-16 DIAGNOSIS — J9382 Other air leak: Secondary | ICD-10-CM | POA: Diagnosis not present

## 2017-06-16 DIAGNOSIS — G039 Meningitis, unspecified: Secondary | ICD-10-CM

## 2017-06-16 DIAGNOSIS — F419 Anxiety disorder, unspecified: Secondary | ICD-10-CM | POA: Diagnosis present

## 2017-06-16 DIAGNOSIS — E860 Dehydration: Secondary | ICD-10-CM | POA: Diagnosis present

## 2017-06-16 DIAGNOSIS — H4922 Sixth [abducent] nerve palsy, left eye: Secondary | ICD-10-CM | POA: Diagnosis present

## 2017-06-16 DIAGNOSIS — D72819 Decreased white blood cell count, unspecified: Secondary | ICD-10-CM | POA: Diagnosis not present

## 2017-06-16 DIAGNOSIS — H168 Other keratitis: Secondary | ICD-10-CM | POA: Diagnosis present

## 2017-06-16 DIAGNOSIS — R911 Solitary pulmonary nodule: Secondary | ICD-10-CM | POA: Diagnosis present

## 2017-06-16 DIAGNOSIS — H538 Other visual disturbances: Secondary | ICD-10-CM

## 2017-06-16 DIAGNOSIS — Z8249 Family history of ischemic heart disease and other diseases of the circulatory system: Secondary | ICD-10-CM

## 2017-06-16 DIAGNOSIS — J151 Pneumonia due to Pseudomonas: Secondary | ICD-10-CM | POA: Diagnosis present

## 2017-06-16 DIAGNOSIS — R1310 Dysphagia, unspecified: Secondary | ICD-10-CM | POA: Diagnosis not present

## 2017-06-16 DIAGNOSIS — B181 Chronic viral hepatitis B without delta-agent: Secondary | ICD-10-CM | POA: Diagnosis present

## 2017-06-16 DIAGNOSIS — Z9114 Patient's other noncompliance with medication regimen: Secondary | ICD-10-CM

## 2017-06-16 DIAGNOSIS — R531 Weakness: Secondary | ICD-10-CM

## 2017-06-16 DIAGNOSIS — B459 Cryptococcosis, unspecified: Secondary | ICD-10-CM

## 2017-06-16 DIAGNOSIS — H4902 Third [oculomotor] nerve palsy, left eye: Secondary | ICD-10-CM | POA: Diagnosis present

## 2017-06-16 DIAGNOSIS — J189 Pneumonia, unspecified organism: Secondary | ICD-10-CM

## 2017-06-16 DIAGNOSIS — Z79899 Other long term (current) drug therapy: Secondary | ICD-10-CM

## 2017-06-16 DIAGNOSIS — Z681 Body mass index (BMI) 19 or less, adult: Secondary | ICD-10-CM

## 2017-06-16 DIAGNOSIS — B457 Disseminated cryptococcosis: Secondary | ICD-10-CM | POA: Diagnosis present

## 2017-06-16 DIAGNOSIS — H02402 Unspecified ptosis of left eyelid: Secondary | ICD-10-CM | POA: Diagnosis present

## 2017-06-16 DIAGNOSIS — Z0189 Encounter for other specified special examinations: Secondary | ICD-10-CM

## 2017-06-16 DIAGNOSIS — E88A Wasting disease (syndrome) due to underlying condition: Secondary | ICD-10-CM | POA: Diagnosis present

## 2017-06-16 DIAGNOSIS — F1410 Cocaine abuse, uncomplicated: Secondary | ICD-10-CM | POA: Diagnosis present

## 2017-06-16 DIAGNOSIS — J050 Acute obstructive laryngitis [croup]: Secondary | ICD-10-CM | POA: Diagnosis present

## 2017-06-16 DIAGNOSIS — E871 Hypo-osmolality and hyponatremia: Secondary | ICD-10-CM

## 2017-06-16 DIAGNOSIS — E876 Hypokalemia: Secondary | ICD-10-CM

## 2017-06-16 DIAGNOSIS — F17210 Nicotine dependence, cigarettes, uncomplicated: Secondary | ICD-10-CM | POA: Diagnosis present

## 2017-06-16 DIAGNOSIS — Z111 Encounter for screening for respiratory tuberculosis: Secondary | ICD-10-CM

## 2017-06-16 DIAGNOSIS — R627 Adult failure to thrive: Secondary | ICD-10-CM | POA: Diagnosis present

## 2017-06-16 DIAGNOSIS — G0481 Other encephalitis and encephalomyelitis: Secondary | ICD-10-CM | POA: Diagnosis present

## 2017-06-16 DIAGNOSIS — A419 Sepsis, unspecified organism: Secondary | ICD-10-CM | POA: Diagnosis not present

## 2017-06-16 DIAGNOSIS — K5900 Constipation, unspecified: Secondary | ICD-10-CM | POA: Diagnosis present

## 2017-06-16 DIAGNOSIS — D6959 Other secondary thrombocytopenia: Secondary | ICD-10-CM | POA: Diagnosis not present

## 2017-06-16 DIAGNOSIS — J9601 Acute respiratory failure with hypoxia: Secondary | ICD-10-CM

## 2017-06-16 DIAGNOSIS — Z538 Procedure and treatment not carried out for other reasons: Secondary | ICD-10-CM | POA: Diagnosis not present

## 2017-06-16 DIAGNOSIS — B20 Human immunodeficiency virus [HIV] disease: Principal | ICD-10-CM

## 2017-06-16 DIAGNOSIS — J852 Abscess of lung without pneumonia: Secondary | ICD-10-CM

## 2017-06-16 DIAGNOSIS — E43 Unspecified severe protein-calorie malnutrition: Secondary | ICD-10-CM | POA: Diagnosis present

## 2017-06-16 DIAGNOSIS — J984 Other disorders of lung: Secondary | ICD-10-CM

## 2017-06-16 DIAGNOSIS — E872 Acidosis: Secondary | ICD-10-CM | POA: Diagnosis present

## 2017-06-16 DIAGNOSIS — D638 Anemia in other chronic diseases classified elsewhere: Secondary | ICD-10-CM | POA: Diagnosis present

## 2017-06-16 DIAGNOSIS — R64 Cachexia: Secondary | ICD-10-CM | POA: Diagnosis present

## 2017-06-16 DIAGNOSIS — B451 Cerebral cryptococcosis: Secondary | ICD-10-CM

## 2017-06-16 DIAGNOSIS — E878 Other disorders of electrolyte and fluid balance, not elsewhere classified: Secondary | ICD-10-CM | POA: Diagnosis not present

## 2017-06-16 DIAGNOSIS — R9389 Abnormal findings on diagnostic imaging of other specified body structures: Secondary | ICD-10-CM

## 2017-06-16 DIAGNOSIS — L8990 Pressure ulcer of unspecified site, unspecified stage: Secondary | ICD-10-CM | POA: Diagnosis present

## 2017-06-16 DIAGNOSIS — H4912 Fourth [trochlear] nerve palsy, left eye: Secondary | ICD-10-CM | POA: Diagnosis present

## 2017-06-16 HISTORY — DX: Cellulitis of right upper limb: L03.113

## 2017-06-16 LAB — CBC
HCT: 39.9 % (ref 39.0–52.0)
HEMOGLOBIN: 13.1 g/dL (ref 13.0–17.0)
MCH: 27.2 pg (ref 26.0–34.0)
MCHC: 32.8 g/dL (ref 30.0–36.0)
MCV: 82.8 fL (ref 78.0–100.0)
PLATELETS: 170 10*3/uL (ref 150–400)
RBC: 4.82 MIL/uL (ref 4.22–5.81)
RDW: 13.3 % (ref 11.5–15.5)
WBC: 3.4 10*3/uL — ABNORMAL LOW (ref 4.0–10.5)

## 2017-06-16 LAB — HEPATIC FUNCTION PANEL
ALBUMIN: 3.1 g/dL — AB (ref 3.5–5.0)
ALT: 13 U/L — ABNORMAL LOW (ref 17–63)
AST: 19 U/L (ref 15–41)
Alkaline Phosphatase: 58 U/L (ref 38–126)
BILIRUBIN TOTAL: 0.8 mg/dL (ref 0.3–1.2)
Bilirubin, Direct: 0.2 mg/dL (ref 0.1–0.5)
Indirect Bilirubin: 0.6 mg/dL (ref 0.3–0.9)
TOTAL PROTEIN: 9.7 g/dL — AB (ref 6.5–8.1)

## 2017-06-16 LAB — URINALYSIS, ROUTINE W REFLEX MICROSCOPIC
Bilirubin Urine: NEGATIVE
GLUCOSE, UA: NEGATIVE mg/dL
Hgb urine dipstick: NEGATIVE
KETONES UR: 5 mg/dL — AB
Leukocytes, UA: NEGATIVE
NITRITE: NEGATIVE
PH: 5 (ref 5.0–8.0)
Protein, ur: 100 mg/dL — AB
SPECIFIC GRAVITY, URINE: 1.03 (ref 1.005–1.030)

## 2017-06-16 LAB — BASIC METABOLIC PANEL
ANION GAP: 9 (ref 5–15)
BUN: 17 mg/dL (ref 6–20)
CALCIUM: 9.5 mg/dL (ref 8.9–10.3)
CO2: 31 mmol/L (ref 22–32)
CREATININE: 0.84 mg/dL (ref 0.61–1.24)
Chloride: 90 mmol/L — ABNORMAL LOW (ref 101–111)
GLUCOSE: 152 mg/dL — AB (ref 65–99)
Potassium: 3.2 mmol/L — ABNORMAL LOW (ref 3.5–5.1)
Sodium: 130 mmol/L — ABNORMAL LOW (ref 135–145)

## 2017-06-16 LAB — MAGNESIUM: Magnesium: 2 mg/dL (ref 1.7–2.4)

## 2017-06-16 LAB — RAPID URINE DRUG SCREEN, HOSP PERFORMED
AMPHETAMINES: POSITIVE — AB
BARBITURATES: NOT DETECTED
BENZODIAZEPINES: NOT DETECTED
COCAINE: POSITIVE — AB
OPIATES: NOT DETECTED
TETRAHYDROCANNABINOL: NOT DETECTED

## 2017-06-16 LAB — I-STAT CG4 LACTIC ACID, ED
LACTIC ACID, VENOUS: 2.03 mmol/L — AB (ref 0.5–1.9)
Lactic Acid, Venous: 2.9 mmol/L (ref 0.5–1.9)

## 2017-06-16 LAB — PHOSPHORUS: Phosphorus: 3.9 mg/dL (ref 2.5–4.6)

## 2017-06-16 LAB — CK: CK TOTAL: 35 U/L — AB (ref 49–397)

## 2017-06-16 MED ORDER — AMLODIPINE BESYLATE 5 MG PO TABS
5.0000 mg | ORAL_TABLET | Freq: Every day | ORAL | Status: DC
  Administered 2017-06-17: 5 mg via ORAL
  Filled 2017-06-16: qty 1

## 2017-06-16 MED ORDER — ENOXAPARIN SODIUM 30 MG/0.3ML ~~LOC~~ SOLN
30.0000 mg | SUBCUTANEOUS | Status: DC
  Administered 2017-06-17 – 2017-06-19 (×2): 30 mg via SUBCUTANEOUS
  Filled 2017-06-16 (×2): qty 0.3

## 2017-06-16 MED ORDER — SODIUM CHLORIDE 0.9 % IV BOLUS (SEPSIS)
1000.0000 mL | Freq: Once | INTRAVENOUS | Status: AC
  Administered 2017-06-16: 1000 mL via INTRAVENOUS

## 2017-06-16 MED ORDER — PROMETHAZINE HCL 25 MG PO TABS: 12.5000 mg | ORAL_TABLET | Freq: Four times a day (QID) | ORAL | Status: DC | PRN

## 2017-06-16 MED ORDER — POTASSIUM CHLORIDE CRYS ER 20 MEQ PO TBCR
20.0000 meq | EXTENDED_RELEASE_TABLET | Freq: Two times a day (BID) | ORAL | Status: DC
  Administered 2017-06-17 (×3): 20 meq via ORAL
  Filled 2017-06-16 (×3): qty 1

## 2017-06-16 MED ORDER — SODIUM CHLORIDE 0.9 % IV SOLN
Freq: Once | INTRAVENOUS | Status: AC
  Administered 2017-06-17: via INTRAVENOUS

## 2017-06-16 MED ORDER — ACETAMINOPHEN 325 MG PO TABS
650.0000 mg | ORAL_TABLET | Freq: Four times a day (QID) | ORAL | Status: DC | PRN
  Administered 2017-06-17 – 2017-06-18 (×2): 650 mg via ORAL
  Filled 2017-06-16 (×2): qty 2

## 2017-06-16 MED ORDER — POTASSIUM CHLORIDE CRYS ER 20 MEQ PO TBCR
40.0000 meq | EXTENDED_RELEASE_TABLET | Freq: Once | ORAL | Status: AC
  Administered 2017-06-16: 40 meq via ORAL
  Filled 2017-06-16: qty 2

## 2017-06-16 MED ORDER — ACETAMINOPHEN 650 MG RE SUPP: 650.0000 mg | Freq: Four times a day (QID) | RECTAL | Status: DC | PRN

## 2017-06-16 MED ORDER — HYDRALAZINE HCL 20 MG/ML IJ SOLN
5.0000 mg | Freq: Four times a day (QID) | INTRAMUSCULAR | Status: DC | PRN
  Administered 2017-06-17 – 2017-06-18 (×2): 5 mg via INTRAVENOUS
  Filled 2017-06-16 (×2): qty 1

## 2017-06-16 NOTE — ED Notes (Signed)
Report attempted x 1

## 2017-06-16 NOTE — H&P (Signed)
[facility] Admission History and Physical Service Pager: [phone]  Patient name: Kenneth Rivas Medical record number: [id number] Date of birth: September 05, 1961 Age: 57 y.o. Gender: male  Primary Care Provider: Tommy Medal, Lavell Islam, MD Consultants: ID Code Status: FULL   Chief Complaint:  Weakness   Assessment and Plan: [name] is a 56 y.o. male presenting with cachexia and feeling weak and tired x past 4 weeks. PMH is significant for AIDS, hep B, cocaine use, and HTN.    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  Pt presented to ED via EMS due to weakness x4 weeks although he is unsure of exact duration.  Was recently seen in [facility] ED last week for same however left AMA prior to being admitted.   On arrival, pt afebrile and white count of 3.4.  LA elevated to 2.90 which improved to 2.03 with IVF. LFTs largely within normal range. Patient hypertensive to 173/117 and initially tachycardia which resolved with fluids.  Labs notable for mild hyponatremia to 130, hypokalemia to 3.2 but otherwise unremarkable.  Mag 2.0 and phos 3.9, both within normal, and a normal CK of 35. UA hazy with rare bacteria and 100 protein however without leuks or nitrite to suggest infectious etiology.  In ED, UDS+ for cocaine and amphetamines.  On exam, patient is cachectic with severe muscle wasting.  Appears malnourished.  CXR showed no alveolar PNA, however tiny parenchymal nodules seen bilaterally, suspect infectious vs neoplastic process.  Radiology recommended CT scan.  EKG with sinus tachycardia and biatrial enlargement.    In ED, patient given 1L NS bolus and K-dur to correct hypokalemia.  Per chart review, there have been numerous attempts to set patient up with outpatient infectious disease however pt has not followed up.  Per chart review seen by Dr. Tommy Medal about 2 years ago.  Has not been taking HIV or hepatitis medications.  Last CD4 count is undetectable on 06/11/2017.  Last viral load in 2016 is 14,034.   Suspect current picture likely 2/2 immunocompromised condition, poor po intake and poor compliance with HIV medication and follow up. On home med list has HIV medications listed which he is not taking -- will hold for now and defer to ID when to restart.  -Admit to med-surg, obs, attending Dr. Nori Riis  -consult ID in the AM -Repeat CD4 pending -HIV 1 RNA quant pending  -can consider CT scan to further characterize infectious vs neoplastic process  -AM CBC/BMET  -Continue IV NS @ 150 cc/hr to rehydrate  -Tylenol 650 mg Q6 PRN  -Phenergan 12.5 mg Q6 PRN  -vitals per unit routine  Lactic acidosis.  On admission with LA of 2.90, suspect likely 2/2 dehydration rather than infectious etiology given no white count and pt afebrile.  Improved with fluids to 2.03.   -Continue IV NS @ 150 cc.hr as above   HTN.  On arrival with elevated BP to 173/117.  At home on Norvasc 5 mg daily however patient reports as not taking.  In ED remains with elevated pressures in 702O systolic.  -Restart home Norvasc 5 mg  -Add Hydral 5 mg with parameters: SBP>170 or DBP>110  -Monitor blood pressures  Hepatitis B.  Not currently taking medication and does not follow with Infectious Disease.  LFTs with AST 19, ALT 13 and alk phos 58.  Normal total bili 0.8 and direct bili 0.2.   -Consult ID as above  -Continue to monitor   Hypokalemia.  On admission with K+ 3.2.  In  ED given K-dur 40 mEq x 1 dose.  At home on K-dur 20 mEq BID.   -Continue home [place] -replete as necessary  -Daily BMET  Hyponatremia.  Likely 2/2 dehydration.  On admission with Na 130.  Started on IVFs in ED.  -IVF as above  -Daily BMET   H/o drug use.  In ED, UDS +for cocaine and amphetamines.  May be contributing to his elevated BP on admission depending on time of ingestion.  -CSW consulted   Bedbugs - Reported per EMS on arrival. Pt currently stays in a rooming house.  -Contact precautions placed -CSW consulted to assess for further needs     FEN/GI: Regular diet, IV NS @ 150 cc.hr Prophylaxis: Lovenox   Disposition: Admit to med-surg, obs, attending Dr. Dorcas Mcmurray   History of Present Illness:  Kenneth Rivas is a 56 y.o. male presenting with weakness and being tired x 4 weeks.  Was seen in [facility] ED last week for the same problem and left AMA prior to admission.  States he feels more tired than usual.  Has not been eating or drinking properly.  Currently lives in a rooming house  Is hungry this evening though and would like some graham crackers.  Is not taking HIV or Hepatitis B medications.  Does not follow up with ID despite multiple attempts.  When asked about this he states he is not aware he should follow up with ID.   Denies fevers, CP, SOB, leg swelling, headache, abdominal pain, nausea, vomiting, or diarrhea.    Review Of Systems: Per HPI with the following additions:   Review of Systems  Constitutional: Positive for malaise/fatigue. Negative for chills and fever.  HENT: Negative for congestion and sore throat.   Respiratory: Negative for cough and shortness of breath.   Cardiovascular: Negative for chest pain, palpitations and leg swelling.  Gastrointestinal: Negative for abdominal pain, constipation, diarrhea, nausea and vomiting.  Genitourinary: Negative for dysuria.  Neurological: Positive for weakness.   Patient Active Problem List   Diagnosis Date Noted  . Weakness 06/16/2017  . Cachexia associated with AIDS ([place])   . Hypokalemia   . Hyponatremia   . 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 HTN (hypertension) 08/17/2013  . Hepatitis B   . HIV disease ([place])   . ELEVATED BLOOD PRESSURE 06/12/2010  . TB SKIN TEST, POSITIVE 01/12/2009  . Human immunodeficiency virus (HIV) disease ([place]) 12/28/2008   Past Medical History: Past Medical History:  Diagnosis Date  . AIDS ([place])   . Anemia   . Anxiety   . Hepatitis B   . HIV disease ([place])   . Hypertension   . Immune deficiency disorder [facility])    Past Surgical  History: Past Surgical History:  Procedure Laterality Date  . HERNIA REPAIR     Social History: Social History  Substance Use Topics  . Smoking status: Current Some Day Smoker    Packs/day: 0.30    Types: Cigarettes  . Smokeless tobacco: Never Used     Comment: pt. given quit line information  . Alcohol use 2.0 oz/week    4 Standard drinks or equivalent per week     Comment: beer   Additional social history: Lives in a rooming house.  Never smoker.  Former alcohol use.  Last drink was a long time ago.  Denies drug use.   Please also refer to relevant sections of EMR.  Family History: Family History  Problem Relation Age of Onset  . Hypertension Mother  Allergies and Medications: No Known Allergies No current facility-administered medications on file prior to encounter.    Current Outpatient Prescriptions on File Prior to Encounter  Medication Sig Dispense Refill  . amLODipine (NORVASC) 5 MG tablet Take 1 tablet (5 mg total) by mouth daily. (Patient not taking: Reported on 06/11/2017) 30 tablet 11  . azithromycin (ZITHROMAX) 600 MG tablet Take 2 tablets (1,200 mg total) by mouth every 7 (seven) days. (Patient not taking: Reported on 06/11/2017) 10 tablet 11  . dolutegravir (TIVICAY) 50 MG tablet Take 1 tablet (50 mg total) by mouth daily. (Patient not taking: Reported on 06/11/2017) 30 tablet 3  . emtricitabine-tenofovir (TRUVADA) 200-300 MG per tablet Take 1 tablet by mouth daily. (Patient not taking: Reported on 06/11/2017) 30 tablet 3  . fluconazole (DIFLUCAN) 100 MG tablet Take 1 tablet (100 mg total) by mouth daily. (Patient not taking: Reported on 06/11/2017) 14 tablet 2  . HYDROcodone-acetaminophen (NORCO/VICODIN) 5-325 MG per tablet Take 1-2 tablets by mouth every 4 (four) hours as needed for pain. (Patient not taking: Reported on 06/11/2017) 15 tablet 0  . megestrol (MEGACE ES) 625 MG/5ML suspension Take 5 mLs (625 mg total) by mouth daily. (Patient not taking: Reported on  06/11/2017) 150 mL 4  . naproxen (NAPROSYN) 375 MG tablet Take 1 tablet (375 mg total) by mouth 2 (two) times daily. (Patient not taking: Reported on 06/11/2017) 20 tablet 0  . ondansetron (ZOFRAN) 4 MG tablet Take 1 tablet (4 mg total) by mouth every 8 (eight) hours as needed for nausea. (Patient not taking: Reported on 06/11/2017) 60 tablet 0  . potassium chloride SA (K-DUR,KLOR-CON) 20 MEQ tablet Take 1 tablet (20 mEq total) by mouth 2 (two) times daily. 10 tablet 0  . sulfamethoxazole-trimethoprim (BACTRIM DS,SEPTRA DS) 800-160 MG per tablet Take 1 tablet by mouth once. (Patient not taking: Reported on 06/11/2017) 30 tablet 11  . triamcinolone ointment (KENALOG) 0.5 % Apply topically 2 (two) times daily. (Patient not taking: Reported on 06/11/2017) 30 g 2   Objective: BP (!) 181/107   Pulse 63   Temp 97.9 F (36.6 C) (Oral)   Resp 18   SpO2 98%  Exam: General: 56 yo male, cachectic and tired-appearing, no acute distress  Eyes: EOMI, PERRL, no scleral icterus noted  ENTM: NCAT, MMM, o/p clear  Neck: supple Cardiovascular: RRR no MRG, 2+ pulses  Respiratory: CTAB, work of breathing is comfortable  Gastrointestinal: soft, NTND, +bs  MSK: muscle wasting noted, ROM normal  Derm: skin warm and dry, no rashes  Neuro: alert, oriented x3 Psych: normal mood and affect   Labs and Imaging: CBC BMET   Recent Labs Lab 06/16/17 1345  WBC 3.4*  HGB 13.1  HCT 39.9  PLT 170    Recent Labs Lab 06/16/17 1345  NA 130*  K 3.2*  CL 90*  CO2 31  BUN 17  CREATININE 0.84  GLUCOSE 152*  CALCIUM 9.5     Dg Chest Portable 1 View  Result Date: 06/16/2017 CLINICAL DATA:  Three weeks of weakness, anorexia, emaciation. History of hepatitis-B, HIV-AIDS. EXAM: PORTABLE CHEST 1 VIEW COMPARISON:  Chest x-ray of June 11, 2017 FINDINGS: The lungs are hyperinflated. There is no focal infiltrate. There are subtle nodules of varying sizes up to 3 mm in diameter in both lungs. Some likely reflect pulmonary  vessels on the end but others are more peripheral. The heart and pulmonary vascularity are normal. The mediastinum is normal in width. There is no pleural effusion. IMPRESSION:  No classic alveolar pneumonia. No CHF. Tiny pulmonary parenchymal nodules are suspected bilaterally and may reflect infectious or neoplastic processes. Chest CT scanning is recommended. Electronically Signed   By: David  Martinique M.D.   On: 06/16/2017 16:45    Lovenia Kim, MD 06/16/2017, 11:19 PM PGY-2, [place] Intern pager: [phone], text pages welcome

## 2017-06-16 NOTE — ED Provider Notes (Signed)
[facility]-EMERGENCY DEPT Provider Note   CSN: [id number] Arrival date & time: 06/16/17  1140     History   Chief Complaint Chief Complaint  Patient presents with  . Weakness  . Dehydration    HPI Kenneth Rivas is a 56 y.o. male.  HPI  56 y.o. male with a hx of AIDS, Hepatitis B, HTN, presents to the Emergency Department today via EMS due to weakness for 4 weeks. Seen last week in ED for same. Pt was to be admitted, but left AMA. Pt has not been taking medications for HIV or Hepatitis C. Chart review shows numerous attempts to see patient with Infectious Disease. Pt reports months of weakness and inability to get out of bed. Decrease in PO intake due to lack of appetite. No CP/SOB/ABD pain. No N/V/D. No headaches. No numbness. No fevers. Reported bed bug by EMS on arrival. No other symptoms noted.   Past Medical History:  Diagnosis Date  . AIDS ([facility])   . Anemia   . Anxiety   . Hepatitis B   . HIV disease ([facility])   . Hypertension   . Immune deficiency disorder [facility])     Patient Active Problem List   Diagnosis Date Noted  . 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 HTN (hypertension) 08/17/2013  . Hepatitis B   . HIV disease ([facility])   . ELEVATED BLOOD PRESSURE 06/12/2010  . TB SKIN TEST, POSITIVE 01/12/2009  . Human immunodeficiency virus (HIV) disease ([facility]) 12/28/2008    Past Surgical History:  Procedure Laterality Date  . HERNIA REPAIR         Home Medications    Prior to Admission medications   Medication Sig Start Date End Date Taking? Authorizing Provider  amLODipine (NORVASC) 5 MG tablet Take 1 tablet (5 mg total) by mouth daily. Patient not taking: Reported on 06/11/2017 12/19/13   Daiva EvesVan Dam, Lisette Grinderornelius N, MD  azithromycin (ZITHROMAX) 600 MG tablet Take 2 tablets (1,200 mg total) by mouth every 7 (seven) days. Patient not taking: Reported on 06/11/2017 12/19/13   Daiva EvesVan Dam, Lisette Grinderornelius N, MD  dolutegravir (TIVICAY) 50 MG tablet Take 1 tablet (50 mg total) by mouth  daily. Patient not taking: Reported on 06/11/2017 12/06/14   Daiva EvesVan Dam, Lisette Grinderornelius N, MD  emtricitabine-tenofovir (TRUVADA) 200-300 MG per tablet Take 1 tablet by mouth daily. Patient not taking: Reported on 06/11/2017 12/06/14   Daiva EvesVan Dam, Lisette Grinderornelius N, MD  fluconazole (DIFLUCAN) 100 MG tablet Take 1 tablet (100 mg total) by mouth daily. Patient not taking: Reported on 06/11/2017 08/08/13   Daiva EvesVan Dam, Lisette Grinderornelius N, MD  HYDROcodone-acetaminophen (NORCO/VICODIN) 5-325 MG per tablet Take 1-2 tablets by mouth every 4 (four) hours as needed for pain. Patient not taking: Reported on 06/11/2017 07/13/13   Marlon PelGreene, Tiffany, PA-C  megestrol (MEGACE ES) 625 MG/5ML suspension Take 5 mLs (625 mg total) by mouth daily. Patient not taking: Reported on 06/11/2017 07/27/13   Daiva EvesVan Dam, Lisette Grinderornelius N, MD  naproxen (NAPROSYN) 375 MG tablet Take 1 tablet (375 mg total) by mouth 2 (two) times daily. Patient not taking: Reported on 06/11/2017 07/13/13   Marlon PelGreene, Tiffany, PA-C  ondansetron (ZOFRAN) 4 MG tablet Take 1 tablet (4 mg total) by mouth every 8 (eight) hours as needed for nausea. Patient not taking: Reported on 06/11/2017 09/12/13   Daiva EvesVan Dam, Lisette Grinderornelius N, MD  potassium chloride SA (K-DUR,KLOR-CON) 20 MEQ tablet Take 1 tablet (20 mEq total) by mouth 2 (two) times daily. 06/11/17   Arby BarrettePfeiffer, Marcy, MD  sulfamethoxazole-trimethoprim (BACTRIM DS,SEPTRA  DS) 800-160 MG per tablet Take 1 tablet by mouth once. Patient not taking: Reported on 06/11/2017 12/06/14   Daiva Eves, Lisette Grinder, MD  triamcinolone ointment (KENALOG) 0.5 % Apply topically 2 (two) times daily. Patient not taking: Reported on 06/11/2017 01/15/15   Daiva Eves, Lisette Grinder, MD    Family History Family History  Problem Relation Age of Onset  . Hypertension Mother     Social History Social History  Substance Use Topics  . Smoking status: Current Some Day Smoker    Packs/day: 0.30    Types: Cigarettes  . Smokeless tobacco: Never Used     Comment: pt. given quit line  information  . Alcohol use 2.0 oz/week    4 Standard drinks or equivalent per week     Comment: beer     Allergies   Patient has no known allergies.   Review of Systems Review of Systems ROS reviewed and all are negative for acute change except as noted in the HPI.  Physical Exam Updated Vital Signs BP (!) 149/100 (BP Location: Left Arm)   Pulse (!) 114   Temp 97.9 F (36.6 C) (Oral)   Resp 18   SpO2 99%   Physical Exam  Constitutional: He is oriented to person, place, and time. Vital signs are normal. He appears cachectic. No distress.  HENT:  Head: Normocephalic and atraumatic.  Right Ear: Tympanic membrane, external ear and ear canal normal.  Left Ear: Tympanic membrane, external ear and ear canal normal.  Nose: Nose normal.  Mouth/Throat: Uvula is midline, oropharynx is clear and moist and mucous membranes are normal. No trismus in the jaw. No oropharyngeal exudate, posterior oropharyngeal erythema or tonsillar abscesses.  Eyes: Pupils are equal, round, and reactive to light. EOM are normal.  Neck: Normal range of motion. Neck supple. No tracheal deviation present.  Cardiovascular: Regular rhythm, S1 normal, S2 normal, normal heart sounds, intact distal pulses and normal pulses.  Tachycardia present.   Pulmonary/Chest: Effort normal and breath sounds normal. No respiratory distress. He has no decreased breath sounds. He has no wheezes. He has no rhonchi. He has no rales.  Abdominal: Normal appearance and bowel sounds are normal. There is no tenderness.  Musculoskeletal: Normal range of motion.  Neurological: He is alert and oriented to person, place, and time.  Skin: Skin is warm and dry.  Psychiatric: He has a normal mood and affect. His speech is normal and behavior is normal. Thought content normal.  Nursing note and vitals reviewed.  ED Treatments / Results  Labs (all labs ordered are listed, but only abnormal results are displayed) Labs Reviewed  BASIC  METABOLIC PANEL - Abnormal; Notable for the following:       Result Value   Sodium 130 (*)    Potassium 3.2 (*)    Chloride 90 (*)    Glucose, Bld 152 (*)    All other components within normal limits  CBC - Abnormal; Notable for the following:    WBC 3.4 (*)    All other components within normal limits  URINALYSIS, ROUTINE W REFLEX MICROSCOPIC - Abnormal; Notable for the following:    Color, Urine AMBER (*)    APPearance HAZY (*)    Ketones, ur 5 (*)    Protein, ur 100 (*)    Bacteria, UA RARE (*)    Squamous Epithelial / LPF 0-5 (*)    All other components within normal limits  HEPATIC FUNCTION PANEL - Abnormal; Notable for the following:  Total Protein 9.7 (*)    Albumin 3.1 (*)    ALT 13 (*)    All other components within normal limits  CK - Abnormal; Notable for the following:    Total CK 35 (*)    All other components within normal limits  I-STAT CG4 LACTIC ACID, ED - Abnormal; Notable for the following:    Lactic Acid, Venous 2.90 (*)    All other components within normal limits  I-STAT CG4 LACTIC ACID, ED - Abnormal; Notable for the following:    Lactic Acid, Venous 2.03 (*)    All other components within normal limits  MAGNESIUM  PHOSPHORUS  RAPID URINE DRUG SCREEN, HOSP PERFORMED  T-HELPER CELLS (CD4) COUNT (NOT [facility])  CBG MONITORING, ED    EKG  EKG Interpretation  Date/Time:  Tuesday June 16 2017 12:25:39 EDT Ventricular Rate:  121 PR Interval:  136 QRS Duration: 86 QT Interval:  330 QTC Calculation: 468 R Axis:   86 Text Interpretation:  Sinus tachycardia Biatrial enlargement Nonspecific ST abnormality Abnormal ECG No significant change since last tracing Confirmed by Vanetta MuldersZackowski, Scott [phone]([id number]) on 06/16/2017 3:42:30 PM       Radiology Dg Chest Portable 1 View  Result Date: 06/16/2017 CLINICAL DATA:  Three weeks of weakness, anorexia, emaciation. History of hepatitis-B, HIV-AIDS. EXAM: PORTABLE CHEST 1 VIEW COMPARISON:  Chest x-ray of June 11, 2017  FINDINGS: The lungs are hyperinflated. There is no focal infiltrate. There are subtle nodules of varying sizes up to 3 mm in diameter in both lungs. Some likely reflect pulmonary vessels on the end but others are more peripheral. The heart and pulmonary vascularity are normal. The mediastinum is normal in width. There is no pleural effusion. IMPRESSION: No classic alveolar pneumonia. No CHF. Tiny pulmonary parenchymal nodules are suspected bilaterally and may reflect infectious or neoplastic processes. Chest CT scanning is recommended. Electronically Signed   By: David  SwazilandJordan M.D.   On: 06/16/2017 16:45    Procedures Procedures (including critical care time)  Medications Ordered in ED Medications  0.9 %  sodium chloride infusion (not administered)  potassium chloride SA (K-DUR,KLOR-CON) CR tablet 40 mEq (not administered)  sodium chloride 0.9 % bolus 1,000 mL (1,000 mLs Intravenous New Bag/Given 06/16/17 1906)     Initial Impression / Assessment and Plan / ED Course  I have reviewed the triage vital signs and the nursing notes.  Pertinent labs & imaging results that were available during my care of the patient were reviewed by me and considered in my medical decision making (see chart for details).  Final Clinical Impressions(s) / ED Diagnoses  {I have reviewed and evaluated the relevant laboratory values. {I have reviewed and evaluated the relevant imaging studies. {I have interpreted the relevant EKG. {I have reviewed the relevant previous healthcare records. {I have reviewed EMS Documentation. {I obtained HPI from historian. {Patient discussed with supervising physician.  ED Course:  Assessment: Pt is a 56 y.o. male with a hx of AIDS, Hepatitis B, HTN, presents to the Emergency Department today via EMS due to weakness for 4 weeks. Seen last week in ED for same. Pt was to be admitted, but left AMA. Pt has not been taking medications for HIV or Hepatitis C. Chart review shows numerous  attempts to see patient with Infectious Disease. Pt reports months of weakness and inability to get out of bed. Decrease in PO intake due to lack of appetite. No CP/SOB/ABD pain. No N/V/D. No headaches. No numbness. No fevers. Reported  bed bug by EMS on arrival. On exam, pt in NAD. Cachetic. Malnourished. Nontoxic/nonseptic appearing. VSS. Afebrile. Lungs CTA. Heart RRR. Abdomen nontender soft. iStat lactic 2.90. Suspect this is 2/2 dehydration and non infectious etiology. Given weight based fluids. CBC without leukocytosis. CMP with hyponatremia 130 as well as hypokalemia 3.2. HIV/T Helper drawn. Previous result low. UA pending. UDS pending. CXR showed no alveolar pneumonia. Tiny parenchymal nodules seen bilaterally. Infectious vs neoplastic process. Radiology recommended CT scan. Given fluids in ED. Plan is to Admit.   Disposition/Plan:  Admit Pt acknowledges and agrees with plan  Supervising Physician Vanetta Mulders, MD  Final diagnoses:  Cachexia associated with AIDS [facility])  Hyponatremia  Hypokalemia    New Prescriptions New Prescriptions   No medications on file     Audry Pili, Cordelia Poche 06/16/17 2030    Vanetta Mulders, MD 08/02/17 1535

## 2017-06-16 NOTE — ED Triage Notes (Signed)
Pt arrived via ems from home where there were tons bedbugs and was recently seen here last Thursday for weakness for 3 weeks.  Pt is unable to eat or keep anything.  Pt is emaciated and has HIV.  Pt complains of right arm pain with movement, but no chest pain

## 2017-06-17 ENCOUNTER — Observation Stay (HOSPITAL_COMMUNITY): Payer: Medicaid Other

## 2017-06-17 DIAGNOSIS — H02402 Unspecified ptosis of left eyelid: Secondary | ICD-10-CM | POA: Diagnosis present

## 2017-06-17 DIAGNOSIS — J189 Pneumonia, unspecified organism: Secondary | ICD-10-CM

## 2017-06-17 DIAGNOSIS — B20 Human immunodeficiency virus [HIV] disease: Secondary | ICD-10-CM | POA: Diagnosis present

## 2017-06-17 DIAGNOSIS — G0481 Other encephalitis and encephalomyelitis: Secondary | ICD-10-CM | POA: Diagnosis present

## 2017-06-17 DIAGNOSIS — D6959 Other secondary thrombocytopenia: Secondary | ICD-10-CM | POA: Diagnosis not present

## 2017-06-17 DIAGNOSIS — J851 Abscess of lung with pneumonia: Secondary | ICD-10-CM | POA: Diagnosis present

## 2017-06-17 DIAGNOSIS — E43 Unspecified severe protein-calorie malnutrition: Secondary | ICD-10-CM | POA: Diagnosis present

## 2017-06-17 DIAGNOSIS — B451 Cerebral cryptococcosis: Secondary | ICD-10-CM | POA: Diagnosis present

## 2017-06-17 DIAGNOSIS — J9601 Acute respiratory failure with hypoxia: Secondary | ICD-10-CM | POA: Diagnosis not present

## 2017-06-17 DIAGNOSIS — J151 Pneumonia due to Pseudomonas: Secondary | ICD-10-CM | POA: Diagnosis present

## 2017-06-17 DIAGNOSIS — J9382 Other air leak: Secondary | ICD-10-CM | POA: Diagnosis not present

## 2017-06-17 DIAGNOSIS — R531 Weakness: Secondary | ICD-10-CM | POA: Diagnosis present

## 2017-06-17 DIAGNOSIS — J984 Other disorders of lung: Secondary | ICD-10-CM

## 2017-06-17 DIAGNOSIS — Z681 Body mass index (BMI) 19 or less, adult: Secondary | ICD-10-CM | POA: Diagnosis not present

## 2017-06-17 DIAGNOSIS — E876 Hypokalemia: Secondary | ICD-10-CM | POA: Diagnosis present

## 2017-06-17 DIAGNOSIS — A419 Sepsis, unspecified organism: Secondary | ICD-10-CM | POA: Diagnosis not present

## 2017-06-17 DIAGNOSIS — L03113 Cellulitis of right upper limb: Secondary | ICD-10-CM | POA: Diagnosis present

## 2017-06-17 DIAGNOSIS — B457 Disseminated cryptococcosis: Secondary | ICD-10-CM | POA: Diagnosis present

## 2017-06-17 DIAGNOSIS — R938 Abnormal findings on diagnostic imaging of other specified body structures: Secondary | ICD-10-CM

## 2017-06-17 DIAGNOSIS — B181 Chronic viral hepatitis B without delta-agent: Secondary | ICD-10-CM | POA: Diagnosis present

## 2017-06-17 DIAGNOSIS — R9389 Abnormal findings on diagnostic imaging of other specified body structures: Secondary | ICD-10-CM | POA: Diagnosis present

## 2017-06-17 DIAGNOSIS — E871 Hypo-osmolality and hyponatremia: Secondary | ICD-10-CM | POA: Diagnosis present

## 2017-06-17 DIAGNOSIS — E872 Acidosis: Secondary | ICD-10-CM | POA: Diagnosis present

## 2017-06-17 DIAGNOSIS — Z538 Procedure and treatment not carried out for other reasons: Secondary | ICD-10-CM | POA: Diagnosis not present

## 2017-06-17 DIAGNOSIS — G9341 Metabolic encephalopathy: Secondary | ICD-10-CM | POA: Diagnosis not present

## 2017-06-17 DIAGNOSIS — R64 Cachexia: Secondary | ICD-10-CM | POA: Diagnosis present

## 2017-06-17 DIAGNOSIS — R000 Tachycardia, unspecified: Secondary | ICD-10-CM | POA: Diagnosis present

## 2017-06-17 DIAGNOSIS — I10 Essential (primary) hypertension: Secondary | ICD-10-CM | POA: Diagnosis present

## 2017-06-17 LAB — BASIC METABOLIC PANEL
Anion gap: 10 (ref 5–15)
BUN: 13 mg/dL (ref 6–20)
CHLORIDE: 98 mmol/L — AB (ref 101–111)
CO2: 25 mmol/L (ref 22–32)
Calcium: 8.6 mg/dL — ABNORMAL LOW (ref 8.9–10.3)
Creatinine, Ser: 0.54 mg/dL — ABNORMAL LOW (ref 0.61–1.24)
GFR calc Af Amer: >60 mL/min (ref >60)
GFR calc non Af Amer: >60 mL/min (ref >60)
GLUCOSE: 95 mg/dL (ref 65–99)
POTASSIUM: 3.8 mmol/L (ref 3.5–5.1)
Sodium: 133 mmol/L — ABNORMAL LOW (ref 135–145)

## 2017-06-17 LAB — CBC
HEMATOCRIT: 35.8 % — AB (ref 39.0–52.0)
Hemoglobin: 11.6 g/dL — ABNORMAL LOW (ref 13.0–17.0)
MCH: 26.5 pg (ref 26.0–34.0)
MCHC: 32.4 g/dL (ref 30.0–36.0)
MCV: 81.7 fL (ref 78.0–100.0)
Platelets: 140 10*3/uL — ABNORMAL LOW (ref 150–400)
RBC: 4.38 MIL/uL (ref 4.22–5.81)
RDW: 13.5 % (ref 11.5–15.5)
WBC: 3.3 10*3/uL — AB (ref 4.0–10.5)

## 2017-06-17 LAB — HIV-1 RNA QUANT-NO REFLEX-BLD
HIV 1 RNA QUANT: 8010 {copies}/mL
LOG10 HIV-1 RNA: 3.904 {Log_copies}/mL

## 2017-06-17 LAB — T-HELPER CELLS (CD4) COUNT (NOT AT ARMC)
CD4 T CELL ABS: 10 /uL — AB (ref 400–2700)
CD4 T CELL HELPER: 2 % — AB (ref 33–55)

## 2017-06-17 MED ORDER — AMLODIPINE BESYLATE 10 MG PO TABS
10.0000 mg | ORAL_TABLET | Freq: Every day | ORAL | Status: DC
  Filled 2017-06-17: qty 1

## 2017-06-17 MED ORDER — ENSURE ENLIVE PO LIQD
237.0000 mL | Freq: Three times a day (TID) | ORAL | Status: DC
Start: 2017-06-17 — End: 2017-06-19
  Administered 2017-06-17 – 2017-06-19 (×5): 237 mL via ORAL

## 2017-06-17 MED ORDER — IOPAMIDOL (ISOVUE-300) INJECTION 61%
INTRAVENOUS | Status: AC
  Administered 2017-06-17: 75 mL
  Filled 2017-06-17: qty 75

## 2017-06-17 MED ORDER — ADULT MULTIVITAMIN W/MINERALS CH
1.0000 | ORAL_TABLET | Freq: Every day | ORAL | Status: DC
  Administered 2017-06-17 – 2017-06-19 (×3): 1 via ORAL
  Filled 2017-06-17 (×3): qty 1

## 2017-06-17 MED ORDER — ENSURE ENLIVE PO LIQD
237.0000 mL | Freq: Two times a day (BID) | ORAL | Status: DC
  Administered 2017-06-17 (×2): 237 mL via ORAL

## 2017-06-17 NOTE — Consult Note (Signed)
Date of Admission:  06/16/2017          Reason for Consult: wasting failure to thrive, pulmonary nodules   Referring Provider: Dr. Nori Riis Primary Care Provider: Dr. Tommy Medal  Assessment:  #1 Cavitary RML lesion #2 HIV/AIDS #3 Noncompliance  Plan: 1. Obtain sputa for AFB culture x 3 2. Check serum crypto ag 3. If no conclusive cause for his cavitary PNA can be found he will need bronchoscopy with BAL for fungi, AFB, PCP 4. He desperately needs to be back on ARV--BUT PULMONARY TB or CNS crypto would be CONTRAINDICATION to IMMEDIATE therapy and therapy would need to be delayed 5. followup viral load and genotype 6. Onnie Boer is working on 30 day BIKTARVY via [employer] access which should be able to be continued until ADAP kicks in (again we need to determine if he has TB and or Crypto prior to starting therapy  Active Problems:   Weakness   Abnormal chest x-ray   Protein-calorie malnutrition, severe   . [START ON 06/18/2017] amLODipine  10 mg Oral Daily  . enoxaparin (LOVENOX) injection  30 mg Subcutaneous Q24H  . feeding supplement (ENSURE ENLIVE)  237 mL Oral TID BM  . multivitamin with minerals  1 tablet Oral Daily  . potassium chloride SA  20 mEq Oral BID    HPI: Kenneth Rivas is a 56 y.o. male HIV/AIDs well known to me but not seen since winter of 2016. He has known NNRTI R. I had tried to treat him with PI based and then INSTI based regimen with Tivicay and Truvada. He has FAILED to reingage in care despite our deploying Ambre to see him. His roommate is also my patient and can be sporadic in his care but is typically much better control of his virus.  [facility] and weight loss. He had pulmonary nodules on CXR and now CT is showing area of cavitation     Review of Systems: ROS as in HPI otherwise 12 pt ros is + for poor appetite, depressive symptoms and pruritis associated with "bed bugs"   Past Medical History:    Diagnosis Date  . AIDS ([place])   . Anemia   . Anxiety   . Hepatitis B   . HIV disease ([place])   . Hypertension   . Immune deficiency disorder [facility])     Social History  Substance Use Topics  . Smoking status: Current Some Day Smoker    Packs/day: 0.30    Types: Cigarettes  . Smokeless tobacco: Never Used     Comment: pt. given quit line information  . Alcohol use 2.0 oz/week    4 Standard drinks or equivalent per week     Comment: beer    Family History  Problem Relation Age of Onset  . Hypertension Mother    No Known Allergies  OBJECTIVE: Blood pressure (!) 149/95, pulse 92, temperature 98.2 F (36.8 C), temperature source Oral, resp. rate 20, height _0  (1.575 m), weight 97 lb 6.4 oz (44.2 kg), SpO2 99 %.  Physical Exam   General: ao x3 HEENT: no thrush, PERRL EOMI CV: RRR no mgr Pulm: CTAB no wrr GI: soft nd, nt Ext no edema Neuro: nonfocal  Lab Results Lab Results  Component Value Date   WBC 3.3 (L) 06/17/2017   HGB 11.6 (L) 06/17/2017   HCT 35.8 (L) 06/17/2017   MCV 81.7 06/17/2017   PLT 140 (  L) 06/17/2017    Lab Results  Component Value Date   CREATININE 0.54 (L) 06/17/2017   BUN 13 06/17/2017   NA 133 (L) 06/17/2017   K 3.8 06/17/2017   CL 98 (L) 06/17/2017   CO2 25 06/17/2017    Lab Results  Component Value Date   ALT 13 (L) 06/16/2017   AST 19 06/16/2017   ALKPHOS 58 06/16/2017   BILITOT 0.8 06/16/2017     Microbiology: No results found for this or any previous visit (from the past 240 hour(s)).  Alcide Evener, [place] [facility] [phone] pager    06/17/2017, 8:13 PM

## 2017-06-17 NOTE — Discharge Summary (Signed)
Family Medicine Teaching [facility] Discharge Summary  Patient name: Kenneth Rivas Medical record number: [id number] Date of birth: [birth year] Age: 56 y.o. 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  Patient was also noted to be cachectic on admission.  ID was consulted due to patient's diagnosis of AIDS with a CD4 count of 2.  He was found to have cryptococcemia with cryptococcal meningitis as well as Pseudomonal pneumonia.  He was started on a course of amphotericin B for his cryptococcal infection, ceftazidime for his pneumonia, and azithromycin and Bactrim for AIDS-related prophylaxis.  Patient experienced respiratory failure on 8/3 and was transferred to [facility] for intubation and continued management.  He was extubated on 8/11 and transferred to our service on 8/13. He has required serial LPs to keep his ICP low.  He failed a barium swallow study, so he was getting nutrition through an NG tube until 8/19 when he was placed on a dysphagia II diet.   The repeat LP on 8/15 showed cryptococcal meningitis still active as well as yeast. Patient was  started on high dose fluconazole for an 8 week long planned course in addition to the amphotericin. ID recommended repeat LP on 8/22 which was negative after 6 days for cryptococcus. Anti-retroviral therapy has been withheld in the setting of his acute illness with plans to restart after he has been treated for his cryptococcal infection for 5 weeks. He needs to stay on prophylactic doses of azithromycin and trimethoprim sulfamethoxazole. He will see him back in clinic in about 4 weeks to consider starting antiretroviral therapy.He has been lost to follow-up with ID outpatient multiple times, but the team thinks that the patient will be more motivated to continue treatment after this hospitalization.  PT has assessed patient and determined he needs SNF after discharge.  Social work and case management has been in contact with the patient.  He stayed in a rooming house before coming into the hospital.  Issues for Follow Up:  1. AIDS with cryptococcal meningitis: Patient was started on high dose fluconazole 800 mg daily for LP resulting in yeast for an 8 week long planned course. This was prescribed through 09/07/2017 and patient has follow up with ID. 2. Psuedomonal pneumonia: to continue to receive prophylactic antibiotics. Azithromycin 1,200 mg weekly on Saturdays and trimethoprim-sulfamethoxazole 800-160 mg daily. 3. Cachexia: home with Ensure 4. Left eye Ptosis: Continue Liquifilm tears for left eye ptosis and monitor. Please follow up with an opthalmologist if there is no improvement.  5. Hypertension: Home on Norvasc 10 mg daily for hypertension 6. Follow up with Dr. Orvan Falconer in 3-4 weeks.  7. AIDS: Anti-retroviral therapy can be resumed in 5 weeks from discharge to avoid IRIS.  8. Follow up with your primary care physician with [facility] in 1 week for management of hypertension. 9. Hypokalemia and hypomagnesemia: Follow up BMP with magnesium to ensure potassium and  magnesium are adequate s/p long term treatment with amphotericin.  10. Orders for a 3 in 1 commode, rolling walker, and shower stool have been placed, ensure patient receives these.   Significant Procedures:  Serial LP's  Significant Labs and Imaging:   Recent Labs Lab 07/12/17 0315 07/13/17 0256 07/14/17 0557  WBC 2.0* 2.1* 2.1*  HGB 8.7* 8.3* 8.7*  HCT 26.4* 25.5* 27.2*  PLT 202 182 219    Recent Labs Lab 07/09/17 0338 07/10/17 0730 07/11/17 0451 07/12/17 0315 07/12/17 1654 07/13/17 0256 07/14/17 1017  NA 130* 134* 135 135 136 133* 134*  K 3.0* 3.7 3.3* 3.0* 3.7 3.7 3.7  CL 101 104 105 105 105 105 105  CO2 23 22 25 25 26 25  21*  GLUCOSE 84 86 87 95 93 96 77  BUN 9 13 10 11 13 11 12   CREATININE 0.61 0.70 0.63 0.65 0.73 0.65 0.59*  CALCIUM 8.4* 8.6* 8.5* 8.4* 8.6* 8.4* 8.5*  MG 1.5* 1.5* 1.5* 1.5* 1.8 2.1  --   PHOS 4.1 3.8 3.6 3.7  --  3.4  --     Results/Tests Pending at Time of Discharge: none  Discharge Medications:  Allergies as of 07/14/2017   No Active Allergies     Medication List    STOP taking these medications   dolutegravir 50 MG tablet Commonly known as:  TIVICAY   emtricitabine-tenofovir 200-300 MG tablet Commonly known as:  TRUVADA   HYDROcodone-acetaminophen 5-325 MG tablet Commonly known as:  NORCO/VICODIN   megestrol 625 MG/5ML suspension Commonly known as:  MEGACE ES   naproxen 375 MG tablet Commonly known as:  NAPROSYN   ondansetron 4 MG tablet Commonly known as:  ZOFRAN   potassium chloride SA 20 MEQ tablet Commonly known as:  K-DUR,KLOR-CON   triamcinolone ointment 0.5 % Commonly known as:  KENALOG     TAKE these medications   amLODipine 10 MG tablet Commonly known as:  NORVASC Take 1 tablet (10 mg total) by mouth daily. What changed:  medication strength  how much to take   azithromycin 600 MG tablet Commonly known as:  ZITHROMAX Take 2 tablets (1,200 mg total) by mouth once a week.   feeding supplement  (ENSURE ENLIVE) Liqd Take 237 mLs by mouth 2 (two) times daily between meals.   fluconazole 200 MG tablet Commonly known as:  DIFLUCAN Take 4 tablets (800 mg total) by mouth daily.   polyvinyl alcohol 1.4 % ophthalmic solution Commonly known as:  LIQUIFILM TEARS Place 1 drop into both eyes at bedtime.   sulfamethoxazole-trimethoprim 800-160 MG tablet Commonly known as:  BACTRIM DS,SEPTRA DS Take 1 tablet by mouth daily.            Durable Medical Equipment        Start     Ordered   07/14/17 1401  For home use only DME Walker rolling  [facility](Walkers)  Once    Question:  Patient needs a walker to treat with the following condition  Answer:  Meningitis   07/14/17 1403   07/14/17 1400  DME 3-in-1  Once     07/14/17 1403   07/14/17 1400  DME tub bench  Once     07/14/17 1403   07/13/17 1553  For home use only DME Walker rolling  Once    Question:  Patient needs a  walker to treat with the following condition  Answer:  Meningitis   07/13/17 1553   07/06/17 1648  For home use only DME 3 n 1  Once     07/06/17 1647   07/06/17 1648  For home use only DME Shower stool  Once     07/06/17 1647       Discharge Care Instructions        Start     Ordered   07/18/17 0000  azithromycin (ZITHROMAX) 600 MG tablet  Weekly     07/14/17 1403   07/15/17 0000  amLODipine (NORVASC) 10 MG tablet  Daily     07/14/17 1403   07/15/17 0000  sulfamethoxazole-trimethoprim (BACTRIM DS,SEPTRA DS) 800-160 MG tablet  Daily     07/14/17 1403   07/15/17 0000  fluconazole (DIFLUCAN) 200 MG tablet  Daily     07/14/17 1504   07/14/17 0000  polyvinyl alcohol (LIQUIFILM TEARS) 1.4 % ophthalmic solution  Daily at bedtime     07/14/17 1403   07/14/17 0000  feeding supplement, ENSURE ENLIVE, (ENSURE ENLIVE) LIQD  2 times daily between meals     07/14/17 1403      Discharge Instructions: Please refer to Patient Instructions section of EMR for full details.  Patient was counseled important signs and symptoms  that should prompt return to medical care, changes in medications, dietary instructions, activity restrictions, and follow up appointments.   Follow-Up Appointments: Follow-up Information    [facility]. Schedule an appointment as soon as possible for a visit in 1 week(s).   Contact information: [address] [place] [id number] [phone]       Cliffton Astersampbell, [name], MD. Schedule an appointment as soon as possible for a visit in 3 week(s).   Specialty:  Infectious Diseases Contact information: [address] [place] [address] [phone]           Patrecia Veiga, [place], DO 07/14/2017, 3:08 PM PGY-1, [facility] [facility]

## 2017-06-17 NOTE — Progress Notes (Signed)
Family Medicine Teaching Service Daily Progress Note Intern Pager: [phone]  Patient name: Kenneth Rivas Medical record number: [id number] Date of birth: Sep 05, 1961 Age: 56 y.o. Gender: male  Primary Care Provider: Tommy Medal, Lavell Islam, MD Consultants: Infectious Disease Code Status: FULL  Pt Overview and Major Events to Date:  Admitted on 7/31.  Assessment and Plan:  Kenneth Rivas is a 56 y.o. male presenting with cachexia and feeling weak and tired x past 4 weeks. PMH is significant for AIDS, hep B, cocaine use, and HTN.    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  Pt presented to ED via EMS due to weakness x4 weeks although he is unsure of exact duration.  Was recently seen in [facility] ED last week for same however left AMA prior to being admitted.   On arrival, pt afebrile and white count of 3.4.  LA elevated to 2.90 which improved to 2.03 with IVF. LFTs largely within normal range. Patient hypertensive to 173/117 and initially tachycardia which resolved with fluids.  Mag 2.0 and phos 3.9, both within normal limits, and a normal CK of 35. UA hazy with rare bacteria and 100 protein however without leuks or nitrite to suggest infectious etiology.  In ED, UDS+ for cocaine and amphetamines.  On exam, patient is cachectic with severe muscle wasting.  Appears malnourished.  CXR showed no alveolar PNA, however tiny parenchymal nodules seen bilaterally, suspect infectious vs neoplastic process.  EKG with sinus tachycardia and biatrial enlargement.    In ED, patient given 1L NS bolus and K-dur to correct hypokalemia.  Per chart review, there have been numerous attempts to set patient up with outpatient infectious disease however pt has not followed up.  Per chart review seen by Dr. Tommy Medal about 2 years ago.  Has not been taking HIV or hepatitis medications.  Last CD4 count is undetectable on 06/11/2017.  Last viral load in 2016 is 14,034.  Suspect current picture likely 2/2 immunocompromised condition, poor po intake and poor  compliance with HIV medication and follow up. On home med list has HIV medications listed which he is not taking -- will hold for now and defer to ID when to restart.  CT chest on 8/1 shows cavitary lesion in right middle lobe. -ID has been consulted, we will follow their recommendations -Repeat CD4 pending -HIV 1 RNA quant pending  -daily CBC/BMET  -follow daily Mag, Phos, K for refeeding syndome -Tylenol 650 mg Q6 PRN  -Phenergan 12.5 mg Q6 PRN  -continue Ensure supplement BID between meals -vitals per unit routine -consult Psych for capacity evaluation  Lactic acidosis.  On admission with LA of 2.90, suspect likely 2/2 dehydration rather than infectious etiology given no white count and pt afebrile.  Improved with fluids to 2.03.   -no longer trending, will repeat if clinical picture changes  HTN.  On arrival with elevated BP to 173/117.  At home on Norvasc 5 mg daily however patient reports as not taking.  In ED remains with elevated pressures in 572I systolic.  Most current BP 183/114 on 8/1 -Restart home Norvasc 5 mg  -Add Hydral 5 mg with parameters: SBP>170 or DBP>110  -Monitor blood pressures  Hepatitis B.  Not currently taking medication and does not follow with Infectious Disease.  LFTs with AST 19, ALT 13 and alk phos 58.  Normal total bili 0.8 and direct bili 0.2.   -Consult ID as above  -Continue to monitor   Hypokalemia.  On admission with K+ 3.2.  In ED given  K-dur 40 mEq x 1 dose.  At home on K-dur 20 mEq BID.  K+ 3.8 on 8/1 -Continue home [place] -replete as necessary  -Daily BMET  Hyponatremia.  Likely 2/2 dehydration.  On admission with Na 130.  Started on IVFs in ED.  Na 133 on 8/1. -IVF as above  -Daily BMET   H/o drug use.  In ED, UDS +for cocaine and amphetamines.  May be contributing to his elevated BP on admission depending on time of ingestion.  -CSW consulted   Bedbugs - Reported per EMS on arrival. Pt currently stays in a rooming house.  -Contact  precautions placed -CSW consulted to assess for further needs    FEN/GI: Regular diet Prophylaxis: Lovenox   Disposition: Continue on med-surg, obs   Subjective:  Kenneth Rivas says that he feels tired, weak, and short of breath, and he has been feeling that way for the last 3 weeks.  He says he does not drive and that he lives in a "boarding house."  He says that his symptoms have prevented him from walking for more than 5 minutes at a time.  He denies having pain and a cough.  Objective: Temp:  [97.5 F (36.4 C)-98 F (36.7 C)] 98 F (36.7 C) (08/01 0646) Pulse Rate:  [60-114] 60 (08/01 0648) Resp:  [18-19] 19 (08/01 0646) BP: (149-181)/(90-138) 171/90 (08/01 0648) SpO2:  [95 %-100 %] 100 % (08/01 0648) Weight:  [97 lb 6.4 oz (44.2 kg)] 97 lb 6.4 oz (44.2 kg) (08/01 0646) Physical Exam: General: thin, chronically-ill appearing man eating breakfast in bed Cardiovascular: RRR, no MRG Respiratory: CTAB Abdomen: scaphoid, nontender to palpation Extremities: thin, no lesions noted, no edema  Laboratory:  Recent Labs Lab 06/11/17 1245 06/16/17 1345 06/17/17 0416  WBC 3.0* 3.4* 3.3*  HGB 12.1* 13.1 11.6*  HCT 37.2* 39.9 35.8*  PLT 162 170 140*    Recent Labs Lab 06/11/17 1245 06/16/17 1345 06/16/17 1915 06/17/17 0416  NA 128* 130*  --  133*  K 3.0* 3.2*  --  3.8  CL 90* 90*  --  98*  CO2 27 31  --  25  BUN 13 17  --  13  CREATININE 0.74 0.84  --  0.54*  CALCIUM 9.1 9.5  --  8.6*  PROT 9.2*  --  9.7*  --   BILITOT 0.5  --  0.8  --   ALKPHOS 60  --  58  --   ALT 14*  --  13*  --   AST 21  --  19  --   GLUCOSE 141* 152*  --  95    Imaging/Diagnostic Tests: Ct Chest W Contrast  Result Date: 06/17/2017 CLINICAL DATA:  Shortness of breath. EXAM: CT CHEST WITH CONTRAST TECHNIQUE: Multidetector CT imaging of the chest was performed during intravenous contrast administration. CONTRAST:  40m ISOVUE-300 IOPAMIDOL (ISOVUE-300) INJECTION 61% COMPARISON:  Radiographs  of June 16, 2017. FINDINGS: Cardiovascular: Normal heart size. No pericardial effusion. Atherosclerosis of thoracic aorta is noted. No dissection or aneurysm is noted. Mediastinum/Nodes: No enlarged mediastinal, hilar, or axillary lymph nodes. Thyroid gland, trachea, and esophagus demonstrate no significant findings. Lungs/Pleura: No pneumothorax or pleural effusion is noted. 3 mm nodule is noted in left upper lobe best seen on image number 54 of series 4. 7 mm cavitating abnormality is noted in the right middle lobe best seen on image number 104 series 4. Upper Abdomen: No acute abnormality. Musculoskeletal: No chest wall abnormality. No acute or significant  osseous findings. IMPRESSION: 7 mm cavitary lesion is seen in the right middle lobe. It is uncertain if this represents cavitary metastatic lesion or small pulmonary abscess. Clinical correlation is recommended. Follow-up CT scan in 6-8 weeks is recommended to determine stability or resolution. Aortic Atherosclerosis (ICD10-I70.0). Electronically Signed   By: Marijo Conception, M.D.   On: 06/17/2017 12:04   Dg Chest Portable 1 View  Result Date: 06/16/2017 CLINICAL DATA:  Three weeks of weakness, anorexia, emaciation. History of hepatitis-B, HIV-AIDS. EXAM: PORTABLE CHEST 1 VIEW COMPARISON:  Chest x-ray of June 11, 2017 FINDINGS: The lungs are hyperinflated. There is no focal infiltrate. There are subtle nodules of varying sizes up to 3 mm in diameter in both lungs. Some likely reflect pulmonary vessels on the end but others are more peripheral. The heart and pulmonary vascularity are normal. The mediastinum is normal in width. There is no pleural effusion. IMPRESSION: No classic alveolar pneumonia. No CHF. Tiny pulmonary parenchymal nodules are suspected bilaterally and may reflect infectious or neoplastic processes. Chest CT scanning is recommended. Electronically Signed   By: David  Martinique M.D.   On: 06/16/2017 16:45     Kathrene Alu,  MD 06/17/2017, 7:32 AM PGY-1, [place] Intern pager: [phone], text pages welcome

## 2017-06-17 NOTE — Progress Notes (Signed)
Initial Nutrition Assessment  DOCUMENTATION CODES:   Underweight, Severe malnutrition in context of chronic illness  INTERVENTION:   -Ensure Enlive po TID, each supplement provides 350 kcal and 20 grams of protein -MVI daily  NUTRITION DIAGNOSIS:   Malnutrition (Severe) related to chronic illness (HIV) as evidenced by severe depletion of body fat, severe depletion of muscle mass, energy intake < 75% for > or equal to 1 month.  GOAL:   Patient will meet greater than or equal to 90% of their needs  MONITOR:   PO intake, Supplement acceptance, Labs, Weight trends, Skin, I & O's  REASON FOR ASSESSMENT:   Malnutrition Screening Tool, Consult Assessment of nutrition requirement/status  ASSESSMENT:   Kenneth Rivas is a [age] y.o. male presenting with cachexia and feeling weak and tired x past 4 weeks. PMH is significant for AIDS, hep B, cocaine use, and HTN.    Spoke with pt, who had flat affect at time of visit. He reports poor appetite over the past 3 weeks, stating "I didn't eat anything then because I had no appetite". Pt reports he typically consumes "light foods" such as mashed potatoes and tender meats 1-2 times daily. Noted breakfast tray at bedside, pt consumed 100% of grits, fruit juice, and Ensure.   Pt reports UBW is around 140#. He endorses weight loss, however, unable to provide time frame when wt loss occurred. Wt hx is limited per chart, however, noted a 11.8% wt loss over the past week, which is significant for time frame.   Nutrition-Focused physical exam completed. Findings are severe fat depletion, severe muscle depletion, and no edema. Pt also reports decreased mobility related to leg pain.   Discussed importance of continued good meal and supplement intake to promote healing. Pt amenable to continue drinking Ensure supplements.   Tox screen positive for cocaine and amphetamines.   Labs reviewed: Na: 133.  Diet Order:  Diet regular Room service appropriate? Yes;  Fluid consistency: Thin  Skin:  Reviewed, no issues  Last BM:  06/16/17  Height:   Ht Readings from Last 1 Encounters:  06/17/17 5\' 2"  (1.575 m)    Weight:   Wt Readings from Last 1 Encounters:  06/17/17 97 lb 6.4 oz (44.2 kg)    Ideal Body Weight:  50 kg  BMI:  Body mass index is 17.81 kg/m.  Estimated Nutritional Needs:   Kcal:  1550-1750  Protein:  75-90 grams  Fluid:  > 1.5 L  EDUCATION NEEDS:   Education needs addressed  Alize Acy A. Mayford KnifeWilliams, RD, LDN, CDE Pager: [phone] After hours Pager: [phone]

## 2017-06-17 NOTE — Evaluation (Signed)
Physical Therapy Evaluation Patient Details Name: Kenneth Rivas MRN: [id number] DOB: 12-01-1960 Today's Date: 06/17/2017   History of Present Illness  Kenneth Rivas is a 56 y.o. male presenting with cachexia and feeling weak and tired x past 4 weeks.  Patient with dehydration, and noncompliance with meds/medical followup, and polysubstance abuse.  Patient also noted to have bedbugs by EMS.   PMH is significant for AIDS, hep B, cocaine/amphetamine use, medical noncompliance, and HTN.    Clinical Impression  Patient presents with problems listed below.  Will benefit from acute PT to maximize functional mobility prior to discharge.  Patient with significant weakness impacting mobility - somewhat limited by pain.  Feel patient will need 24 hour assist, and f/u therapy at SNF at discharge. Will continue to follow.    Follow Up Recommendations SNF;Supervision/Assistance - 24 hour    Equipment Recommendations  Wheelchair cushion (measurements PT);Wheelchair (measurements PT)    Recommendations for Other Services       Precautions / Restrictions Precautions Precautions: Fall Restrictions Weight Bearing Restrictions: No      Mobility  Bed Mobility               General bed mobility comments: Patient declined bed mobility, reporting he is too weak.  Explained that PT is here to help him get stronger - continued to decline.  Transfers                    Ambulation/Gait                Stairs            Wheelchair Mobility    Modified Rankin (Stroke Patients Only)       Balance                                             Pertinent Vitals/Pain Pain Assessment: Faces Faces Pain Scale: Hurts little more Pain Location: General Pain Descriptors / Indicators: Aching Pain Intervention(s): Monitored during session;Limited activity within patient's tolerance    Home Living Family/patient expects to be discharged to:: Other (Comment)  (Boarding house) Living Arrangements: Non-relatives/Friends Nurse, mental health(Roommate) Available Help at Discharge: Available PRN/intermittently (Roommate) Type of Home: House       Home Layout: Two level Home Equipment: Environmental consultantWalker - 2 wheels;Cane - single point (? patient describing stair lift)      Prior Function Level of Independence: Independent with assistive device(s);Needs assistance   Gait / Transfers Assistance Needed: Reports he ambulates with RW/cane  ADL's / Homemaking Assistance Needed: Roommate assists with bathing/dressing, meal prep        Hand Dominance        Extremity/Trunk Assessment   Upper Extremity Assessment Upper Extremity Assessment: Defer to OT evaluation    Lower Extremity Assessment Lower Extremity Assessment: RLE deficits/detail;LLE deficits/detail RLE Deficits / Details: Decreased strength - < 3/5.  Patient self-limiting, reporting "I can't do it". LLE Deficits / Details: Decreased strength - < 3/5.  Patient self-limiting, reporting "I can't do it".       Communication   Communication: No difficulties  Cognition Arousal/Alertness: Awake/alert Behavior During Therapy: Flat affect Overall Cognitive Status: No family/caregiver present to determine baseline cognitive functioning  General Comments: Difficulty answering some questions about boarding house and PLOF.      General Comments      Exercises     Assessment/Plan    PT Assessment Patient needs continued PT services  PT Problem List Decreased strength;Decreased range of motion;Decreased activity tolerance;Decreased balance;Decreased mobility;Decreased knowledge of use of DME;Pain       PT Treatment Interventions DME instruction;Gait training;Functional mobility training;Therapeutic activities;Therapeutic exercise;Balance training;Patient/family education    PT Goals (Current goals can be found in the Care Plan section)  Acute Rehab PT  Goals Patient Stated Goal: None stated PT Goal Formulation: With patient Time For Goal Achievement: 07/01/17 Potential to Achieve Goals: Fair    Frequency Min 3X/week   Barriers to discharge        Co-evaluation               AM-PAC PT "6 Clicks" Daily Activity  Outcome Measure Difficulty turning over in bed (including adjusting bedclothes, sheets and blankets)?: Total Difficulty moving from lying on back to sitting on the side of the bed? : Total Difficulty sitting down on and standing up from a chair with arms (e.g., wheelchair, bedside commode, etc,.)?: Total Help needed moving to and from a bed to chair (including a wheelchair)?: Total Help needed walking in hospital room?: Total Help needed climbing 3-5 steps with a railing? : Total 6 Click Score: 6    End of Session   Activity Tolerance: Patient limited by fatigue;Patient limited by pain Patient left: in bed;with call bell/phone within reach;with bed alarm set   PT Visit Diagnosis: Difficulty in walking, not elsewhere classified (R26.2);Pain;Muscle weakness (generalized) (M62.81) Pain - part of body:  (Generalized)    Time: [id number] PT Time Calculation (min) (ACUTE ONLY): 15 min   Charges:   PT Evaluation $PT Eval Moderate Complexity: 1 Mod     PT G Codes:   PT G-Codes **NOT FOR INPATIENT CLASS** Functional Assessment Tool Used: AM-PAC 6 Clicks Basic Mobility Functional Limitation: Changing and maintaining body position Changing and Maintaining Body Position Current Status (W1191(G8981): At least 80 percent but less than 100 percent impaired, limited or restricted Changing and Maintaining Body Position Goal Status (Y7829(G8982): At least 40 percent but less than 60 percent impaired, limited or restricted    Durenda HurtSusan H. Renaldo Fiddleravis, PT, [facility] Acute Rehab Services Pager [phone]   Kenneth Rivas 06/17/2017, 4:55 PM

## 2017-06-18 ENCOUNTER — Inpatient Hospital Stay (HOSPITAL_COMMUNITY): Payer: Medicaid Other

## 2017-06-18 DIAGNOSIS — F1490 Cocaine use, unspecified, uncomplicated: Secondary | ICD-10-CM

## 2017-06-18 DIAGNOSIS — B459 Cryptococcosis, unspecified: Secondary | ICD-10-CM | POA: Diagnosis present

## 2017-06-18 DIAGNOSIS — F17210 Nicotine dependence, cigarettes, uncomplicated: Secondary | ICD-10-CM

## 2017-06-18 DIAGNOSIS — E43 Unspecified severe protein-calorie malnutrition: Secondary | ICD-10-CM

## 2017-06-18 DIAGNOSIS — E878 Other disorders of electrolyte and fluid balance, not elsewhere classified: Secondary | ICD-10-CM | POA: Diagnosis not present

## 2017-06-18 LAB — BASIC METABOLIC PANEL
ANION GAP: 6 (ref 5–15)
Anion gap: 9 (ref 5–15)
BUN: 11 mg/dL (ref 6–20)
BUN: 11 mg/dL (ref 6–20)
CO2: 32 mmol/L (ref 22–32)
CO2: 28 mmol/L (ref 22–32)
CREATININE: 0.46 mg/dL — AB (ref 0.61–1.24)
Calcium: 8.8 mg/dL — ABNORMAL LOW (ref 8.9–10.3)
Calcium: 9 mg/dL (ref 8.9–10.3)
Chloride: 93 mmol/L — ABNORMAL LOW (ref 101–111)
Chloride: 92 mmol/L — ABNORMAL LOW (ref 101–111)
Creatinine, Ser: 0.57 mg/dL — ABNORMAL LOW (ref 0.61–1.24)
GFR calc Af Amer: >60 mL/min (ref >60)
GFR calc non Af Amer: >60 mL/min (ref >60)
Glucose, Bld: 178 mg/dL — ABNORMAL HIGH (ref 65–99)
Glucose, Bld: 112 mg/dL — ABNORMAL HIGH (ref 65–99)
POTASSIUM: 3.3 mmol/L — AB (ref 3.5–5.1)
Potassium: 4.5 mmol/L (ref 3.5–5.1)
SODIUM: 131 mmol/L — AB (ref 135–145)
Sodium: 129 mmol/L — ABNORMAL LOW (ref 135–145)

## 2017-06-18 LAB — CRYPTOCOCCAL ANTIGEN: CRYPTO AG: POSITIVE — AB

## 2017-06-18 LAB — CBC
HCT: 36.8 % — ABNORMAL LOW (ref 39.0–52.0)
Hemoglobin: 12.2 g/dL — ABNORMAL LOW (ref 13.0–17.0)
MCH: 27.1 pg (ref 26.0–34.0)
MCHC: 33.2 g/dL (ref 30.0–36.0)
MCV: 81.8 fL (ref 78.0–100.0)
PLATELETS: 156 10*3/uL (ref 150–400)
RBC: 4.5 MIL/uL (ref 4.22–5.81)
RDW: 13.5 % (ref 11.5–15.5)
WBC: 4 10*3/uL (ref 4.0–10.5)

## 2017-06-18 LAB — EXPECTORATED SPUTUM ASSESSMENT W REFEX TO RESP CULTURE

## 2017-06-18 LAB — EXPECTORATED SPUTUM ASSESSMENT W GRAM STAIN, RFLX TO RESP C

## 2017-06-18 LAB — MAGNESIUM
MAGNESIUM: 1.9 mg/dL (ref 1.7–2.4)
Magnesium: 1.7 mg/dL (ref 1.7–2.4)

## 2017-06-18 LAB — PHOSPHORUS
PHOSPHORUS: 3.1 mg/dL (ref 2.5–4.6)
PHOSPHORUS: 1.5 mg/dL — AB (ref 2.5–4.6)

## 2017-06-18 MED ORDER — DIPHENHYDRAMINE HCL 25 MG PO CAPS: 25.0000 mg | ORAL_CAPSULE | Freq: Every day | ORAL | Status: DC | PRN

## 2017-06-18 MED ORDER — SODIUM CHLORIDE 0.9 % IV BOLUS (SEPSIS)
500.0000 mL | Freq: Every day | INTRAVENOUS | Status: DC
  Administered 2017-06-19: 500 mL via INTRAVENOUS
  Filled 2017-06-18 (×2): qty 500

## 2017-06-18 MED ORDER — DEXTROSE 5 % IV SOLN
4.0000 mg/kg | INTRAVENOUS | Status: DC
  Administered 2017-06-18: 180 mg via INTRAVENOUS
  Filled 2017-06-18 (×2): qty 180

## 2017-06-18 MED ORDER — POTASSIUM CHLORIDE CRYS ER 20 MEQ PO TBCR
20.0000 meq | EXTENDED_RELEASE_TABLET | Freq: Two times a day (BID) | ORAL | Status: DC
  Administered 2017-06-18 – 2017-06-19 (×2): 20 meq via ORAL
  Filled 2017-06-18: qty 1

## 2017-06-18 MED ORDER — MEPERIDINE HCL 25 MG/ML IJ SOLN: 25.0000 mg | INTRAMUSCULAR | Status: DC | PRN

## 2017-06-18 MED ORDER — DEXTROSE 5 % IV SOLN
4.0000 mg/kg | INTRAVENOUS | Status: DC
  Filled 2017-06-18: qty 180

## 2017-06-18 MED ORDER — POTASSIUM CHLORIDE CRYS ER 20 MEQ PO TBCR
20.0000 meq | EXTENDED_RELEASE_TABLET | Freq: Once | ORAL | Status: AC
  Administered 2017-06-18: 20 meq via ORAL
  Filled 2017-06-18: qty 1

## 2017-06-18 MED ORDER — DIPHENHYDRAMINE HCL 50 MG/ML IJ SOLN: 25.0000 mg | Freq: Every day | INTRAMUSCULAR | Status: DC | PRN

## 2017-06-18 MED ORDER — SODIUM CHLORIDE 0.9 % IV BOLUS FOR AMBISOME
500.0000 mL | Freq: Every day | INTRAVENOUS | Status: DC
  Administered 2017-06-18: 500 mL via INTRAVENOUS
  Filled 2017-06-18: qty 500

## 2017-06-18 MED ORDER — DEXTROSE 5% FOR FLUSHING BEFORE AND AFTER AMBISOME
10.0000 mL | INTRAVENOUS | Status: DC
  Administered 2017-06-18: 10 mL via INTRAVENOUS
  Filled 2017-06-18 (×4): qty 10

## 2017-06-18 MED ORDER — SODIUM CHLORIDE 0.9 % IV BOLUS FOR AMBISOME
500.0000 mL | INTRAVENOUS | Status: DC
  Filled 2017-06-18: qty 500

## 2017-06-18 MED ORDER — SODIUM CHLORIDE 0.9 % IV BOLUS FOR AMBISOME
500.0000 mL | INTRAVENOUS | Status: DC
  Administered 2017-06-18: 500 mL via INTRAVENOUS
  Filled 2017-06-18: qty 500

## 2017-06-18 MED ORDER — HYDROCHLOROTHIAZIDE 12.5 MG PO CAPS
12.5000 mg | ORAL_CAPSULE | Freq: Every day | ORAL | Status: DC
  Administered 2017-06-18: 12.5 mg via ORAL
  Filled 2017-06-18 (×2): qty 1

## 2017-06-18 MED ORDER — AMLODIPINE BESYLATE 5 MG PO TABS
5.0000 mg | ORAL_TABLET | Freq: Every day | ORAL | Status: DC
  Administered 2017-06-19: 5 mg via ORAL
  Filled 2017-06-18: qty 1

## 2017-06-18 MED ORDER — POTASSIUM PHOSPHATES 15 MMOLE/5ML IV SOLN
20.0000 meq | Freq: Once | INTRAVENOUS | Status: AC
  Administered 2017-06-18: 20 meq via INTRAVENOUS
  Filled 2017-06-18: qty 4.55

## 2017-06-18 MED ORDER — DEXTROSE 5% FOR FLUSHING BEFORE AND AFTER AMBISOME
10.0000 mL | INTRAVENOUS | Status: DC
  Administered 2017-06-19: 10 mL via INTRAVENOUS
  Filled 2017-06-18: qty 10

## 2017-06-18 MED ORDER — LIDOCAINE HCL (PF) 1 % IJ SOLN
INTRAMUSCULAR | Status: AC
  Filled 2017-06-18: qty 5

## 2017-06-18 MED ORDER — FLUCYTOSINE 250 MG PO CAPS
25.0000 mg/kg | ORAL_CAPSULE | Freq: Four times a day (QID) | ORAL | Status: DC
  Administered 2017-06-18 – 2017-06-20 (×4): 1000 mg via ORAL
  Filled 2017-06-18 (×3): qty 4
  Filled 2017-06-18 (×2): qty 2
  Filled 2017-06-18 (×2): qty 4
  Filled 2017-06-18: qty 2
  Filled 2017-06-18 (×5): qty 4

## 2017-06-18 MED ORDER — ACETAMINOPHEN 325 MG PO TABS
ORAL_TABLET | ORAL | Status: AC
  Filled 2017-06-18: qty 2

## 2017-06-18 NOTE — Progress Notes (Signed)
Family Medicine Teaching Service Daily Progress Note Intern Pager: [phone]  Patient name: Kenneth Rivas Medical record number: [id number] Date of birth: Nov 28, 1960 Age: 56 y.o. Gender: male  Primary Care Provider: Tommy Medal, Lavell Islam, MD Consultants: Infectious Disease Code Status: FULL  Pt Overview and Major Events to Date:  Admitted on 7/31. On 8/1, found to have cryptococcal antigen in serum  Assessment and Plan:  Kenneth Rivas is a 56 y.o. male presenting with cachexia and feeling weak and tired x past 4 weeks. PMH is significant for AIDS, hep B, cocaine use, and HTN.    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  Pt presented to ED via EMS due to weakness x4 weeks although he is unsure of exact duration.  Was recently seen in [facility] ED last week for same however left AMA prior to being admitted.   On arrival, pt afebrile and white count of 3.4.  LA elevated to 2.90 which improved to 2.03 with IVF. LFTs largely within normal range. Patient hypertensive to 173/117 and initially tachycardia which resolved with fluids.  Mag 2.0 and phos 3.9, both within normal limits, and a normal CK of 35. UA hazy with rare bacteria and 100 protein however without leuks or nitrite to suggest infectious etiology.  In ED, UDS+ for cocaine and amphetamines.  On exam, patient is cachectic with severe muscle wasting.  Appears malnourished.  CXR showed no alveolar PNA, however tiny parenchymal nodules seen bilaterally, suspect infectious vs neoplastic process.  EKG with sinus tachycardia and biatrial enlargement.    In ED, patient given 1L NS bolus and K-dur to correct hypokalemia.  Per chart review, there have been numerous attempts to set patient up with outpatient infectious disease however pt has not followed up.  Per chart review seen by Dr. Tommy Medal about 2 years ago.  Has not been taking HIV or hepatitis medications.  Last CD4 count is undetectable on 06/11/2017.  Last viral load in 2016 is 14,034.  Suspect current picture likely 2/2  immunocompromised condition, poor po intake and poor compliance with HIV medication and follow up. On home med list has HIV medications listed which he is not taking -- will hold for now and defer to ID when to restart.  Patient placed in negative pressure room with airborne and contact precautions. CT chest on 8/1 shows cavitary lesion in right middle lobe.  Serum + for cryptococcus.  Episode of bloody sputum on 8/1.  Blood cultures pending -ID has been consulted, recommended getting sputum for TB and holding any antiretroviral therapy if patient is crypto or TB + -head CT  -LP to rule out cryptococcal meningitis -AFB culture and smear pending -Repeat CD4 pending -HIV 1 RNA quant pending  -daily CBC/BMET  -follow daily Mag, Phos, K for refeeding syndome -Tylenol 650 mg Q6 PRN  -Phenergan 12.5 mg Q6 PRN  -continue Ensure supplement BID between meals -vitals per unit routine -follow-up with Psych's evaluation of capacity -follow-up blood cultures -follow-up sputum culture - specimen from 8/2 at 0500 not adequate  Lactic acidosis.  On admission with LA of 2.90, suspect likely 2/2 dehydration rather than infectious etiology given no white count and pt afebrile.  Improved with fluids to 2.03.   -no longer trending, will repeat if clinical picture changes  HTN.  On arrival with elevated BP to 173/117.  At home on Norvasc 5 mg daily however patient reports as not taking.  In ED remains with elevated pressures in 086P systolic.  Most current BP 161/97 on 8/2 -  Continue home Norvasc to 5 mg -Start HCTZ 12.5 mg -Add Hydral 5 mg with parameters: SBP>170 or DBP>110  -Monitor blood pressures  Hepatitis B.  Not currently taking medication and does not follow with Infectious Disease.  LFTs with AST 19, ALT 13 and alk phos 58.  Normal total bili 0.8 and direct bili 0.2.   -Consult ID as above  -Continue to monitor   Hypokalemia.  On admission with K+ 3.2.  In ED given K-dur 40 mEq x 1 dose.  At  home on K-dur 20 mEq BID.  K+ 3.3 on 8/2 -Continue home [place] -replete as necessary  -Daily BMET  Hyponatremia.  Likely 2/2 dehydration.  On admission with Na 130.  Started on IVFs in ED.  Na 133 on 8/1. -IVF as above  -Daily BMET   H/o drug use.  In ED, UDS +for cocaine and amphetamines.  May be contributing to his elevated BP on admission depending on time of ingestion.  -CSW consulted   Bedbugs - Reported per EMS on arrival. Pt currently stays in a rooming house.  -Contact precautions placed -CSW consulted to assess for further needs    FEN/GI: Regular diet Prophylaxis: Lovenox   Disposition: Continue on med-surg   Subjective:  [name] says that he feels fine this morning but does not have much of an appetite.  He denies cough or shortness of breath.  He denies any pain.  Objective: Temp:  [98.2 F (36.8 C)-98.4 F (36.9 C)] 98.4 F (36.9 C) (08/02 0605) Pulse Rate:  [87-111] 90 (08/02 0605) Resp:  [19-20] 19 (08/02 0605) BP: (149-183)/(95-114) 166/110 (08/02 0605) SpO2:  [95 %-99 %] 95 % (08/02 4098) Physical Exam: General: thin, chronically-ill appearing man eating breakfast in bed Cardiovascular: RRR, no MRG Respiratory: CTAB Abdomen: scaphoid, nontender to palpation Extremities: thin, no lesions noted, no edema  Laboratory:  Recent Labs Lab 06/16/17 1345 06/17/17 0416 06/18/17 0029  WBC 3.4* 3.3* 4.0  HGB 13.1 11.6* 12.2*  HCT 39.9 35.8* 36.8*  PLT 170 140* 156    Recent Labs Lab 06/11/17 1245 06/16/17 1345 06/16/17 1915 06/17/17 0416 06/18/17 0029  NA 128* 130*  --  133* 131*  K 3.0* 3.2*  --  3.8 3.3*  CL 90* 90*  --  98* 93*  CO2 27 31  --  25 32  BUN 13 17  --  13 11  CREATININE 0.74 0.84  --  0.54* 0.57*  CALCIUM 9.1 9.5  --  8.6* 8.8*  PROT 9.2*  --  9.7*  --   --   BILITOT 0.5  --  0.8  --   --   ALKPHOS 60  --  58  --   --   ALT 14*  --  13*  --   --   AST 21  --  19  --   --   GLUCOSE 141* 152*  --  95 178*   Mag:  1.9, Phos: 1.5  Imaging/Diagnostic Tests: Ct Chest W Contrast  Result Date: 06/17/2017 CLINICAL DATA:  Shortness of breath. EXAM: CT CHEST WITH CONTRAST TECHNIQUE: Multidetector CT imaging of the chest was performed during intravenous contrast administration. CONTRAST:  39m ISOVUE-300 IOPAMIDOL (ISOVUE-300) INJECTION 61% COMPARISON:  Radiographs of June 16, 2017. FINDINGS: Cardiovascular: Normal heart size. No pericardial effusion. Atherosclerosis of thoracic aorta is noted. No dissection or aneurysm is noted. Mediastinum/Nodes: No enlarged mediastinal, hilar, or axillary lymph nodes. Thyroid gland, trachea, and esophagus demonstrate no significant findings. Lungs/Pleura: No  pneumothorax or pleural effusion is noted. 3 mm nodule is noted in left upper lobe best seen on image number 54 of series 4. 7 mm cavitating abnormality is noted in the right middle lobe best seen on image number 104 series 4. Upper Abdomen: No acute abnormality. Musculoskeletal: No chest wall abnormality. No acute or significant osseous findings. IMPRESSION: 7 mm cavitary lesion is seen in the right middle lobe. It is uncertain if this represents cavitary metastatic lesion or small pulmonary abscess. Clinical correlation is recommended. Follow-up CT scan in 6-8 weeks is recommended to determine stability or resolution. Aortic Atherosclerosis (ICD10-I70.0). Electronically Signed   By: Marijo Conception, M.D.   On: 06/17/2017 12:04     Kathrene Alu, MD 06/18/2017, 6:49 AM PGY-1, [place] Intern pager: [phone], text pages welcome

## 2017-06-18 NOTE — Progress Notes (Signed)
RN saw the order for active Telemonitor but this Am got the report pt is Non-Tele, double checked with CCMD and paged MD to verify that pt actually needed to be in monitor or not, is waiting for the MD response now.

## 2017-06-18 NOTE — Progress Notes (Signed)
OT Cancellation Note  Patient Details Name: Jacolyn Reedylonzo Stineman MRN: [id number] DOB: [birth year]   Cancelled Treatment:    Reason Eval/Treat Not Completed: Patient at procedure or test/ unavailable (pt currently in CT). Will follow up for OT eval as time allows.  Gaye AlkenBailey A Stormy Connon M.S., OTR/L Pager: [phone]  06/18/2017, 1:27 PM

## 2017-06-18 NOTE — Progress Notes (Signed)
Dr Cain SieveSeng notified patient has blood in his sputum. Specimen sent to lab. No orders recieved

## 2017-06-18 NOTE — Procedures (Signed)
Radiology Procedure Note  Aborted LP.  When prone on the table patient became unresponsive with insufficient respirations.  He was quickly moved back to the stretcher, supine with elevated HOB.  Radial pulse was initially weak but normalized by the time a monitor was attached.  His mentation gradually returned to prior baseline.  Vitals recorded elsewhere, he was hypertensive in NSR with normal O2.  This was presumed positional hypoxia.  FM service notified by phone.    JWatts MD

## 2017-06-18 NOTE — Progress Notes (Signed)
Cardiac monitoring restarted, condom catheter changed, gown changed, pt is responding but alert and oriented times 2-3 this point, hardly answering the questions, n only saying yes or no to the RN, Vitals stable this point, will continue to  monitor

## 2017-06-18 NOTE — Progress Notes (Signed)
      INFECTIOUS DISEASE ATTENDING ADDENDUM:   Date: 06/18/2017  Patient name: Kenneth Rivas  Medical record number: [id number]  Date of birth: Mar 27, 1961    THIS PATIENT NEEDS A LUMBAR PUNCTURE TO DETERMINE IF HE HAS CRYPTOCOCCAL MENINGITIS AND TO HELP MANAGE HIS ICP  HE NEEDS LP WITH DOCUMENTED OPENING PRESSURE AND SUFFICIENT CSF REMOVED TO DROP CLOSING PRESSURE TO LESS THAN 20 CMH20  HIS CSF SHOULD BE SENT FOR   CELL COUNT AND DIFFERENTIAL  PROTEIN AND GLUCOSE  CSF CRYPTOCOCCAL ANTIGEN  CSF CULTURE    Paulette Blanchornelius Van Dam 06/18/2017, 8:11 AM

## 2017-06-18 NOTE — Progress Notes (Addendum)
Informed by IR that patient stopped breathing when prone for LP, so it was stopped.  Patient recovered, but IR was not comfortable trying LP again.  Neurology and CCM were called to assist with bedside LP, but they were unable to help.  Family Medicine will perform LP at bedside tomorrow morning at around 0900./ Dr Frances FurbishWinfrey ================================================================================================================================================  Given recent history of patient's cessation of respiration during preparation for lumbar puncture procedure by Invasive Radiology and concern expressed by our Infectious Disease consultant, it would not be prudent to proceed with a bedside diagnostic and possible therapeutic Lumbar puncture.  Will consult CCM for consideration of intubation with mechanical intubation during LP procedure.   Given either inability or refusal to answer questions to determine patients medical decision making capacityfrom Psychiatry consultation, it would be best to contact patient's emergency contact. Eilleen Kempfheresa Moore (sister, [phone]), to see if she could act as patient's agent of substituted judgment for consenting to procedures. / Acquanetta Bellingodd McDiarmid, MD, [facility] Teaching Service Attending

## 2017-06-18 NOTE — Progress Notes (Signed)
Called to DX rad due to pt not responsive once on abd, pt back in bed when I arived

## 2017-06-18 NOTE — Progress Notes (Signed)
Patient arrived to unit via bed. Airborne and contact precautions started. Patient oriented to room. Patient informed to call for help before getting out of bed. Patient verbalizes understanding. Bed alarm activated.

## 2017-06-18 NOTE — Clinical Social Work Note (Signed)
CSW handoff from yesterday states "Per Rn, has bedbugs, O42, doesn't want to see CSW or RNCM."  Charlynn CourtSarah Vung Kush, CSW [phone]

## 2017-06-18 NOTE — Progress Notes (Signed)
Dr Cain SieveSeng notified patient Cryptococcal test came back positive, No orders recieved

## 2017-06-18 NOTE — Consult Note (Signed)
[facility] Face-to-Face Psychiatry Consult   Reason for Consult:  Mental capacity assessment Referring Physician:  Dr. 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 Refeeding syndrome [E87.8] 06/18/2017  . Hypophosphatemia [E83.39] 06/18/2017  . 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 Protein-calorie malnutrition, severe [E43] 06/17/2017  . Abnormal chest x-ray [R93.8]   . AIDS (acquired immune deficiency syndrome) (HCC) [B20]   . Cavitary pneumonia [J18.9, J98.4]   . Weakness [R53.1] 06/16/2017  . 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 HTN (hypertension) [I10] 08/17/2013  . Hepatitis B [B19.10]   . ELEVATED BLOOD PRESSURE [R03.0] 06/12/2010  . TB SKIN TEST, POSITIVE [795.5] 01/12/2009    Total Time spent with patient: 20 minutes  Subjective:   Kenneth Rivas is a 56 y.o. male patient admitted with Cryptococcosis.  HPI:  Attempted to communicate with patient. Patient would open his eyes and the close them back and would not answer any questions. Due to patient being nonverbal, I am not able to assess patient's capacity  Past Psychiatric History: None reported  Risk to Self: Is patient at risk for suicide?: No Risk to Others:   Prior Inpatient Therapy:   Prior Outpatient Therapy:    Past Medical History:  Past Medical History:  Diagnosis Date  . AIDS (HCC)   . Anemia   . Anxiety   . Hepatitis B   . HIV disease (HCC)   . Hypertension   . Immune deficiency disorder [facility])     Past Surgical History:  Procedure Laterality Date  . HERNIA REPAIR     Family History:  Family History  Problem Relation Age of Onset  . Hypertension Mother    Family Psychiatric  History: None reported Social History:  History  Alcohol Use  . 2.0 oz/week  . 4 Standard drinks or equivalent per week    Comment: beer      History  Drug Use  . Types: Cocaine    Comment: crack- last use 03/2012. no h/o IVDU, other illicits    Social History   Social History  . Marital status: Married    Spouse name: N/A  . Number of children: N/A  . Years of education: N/A   Social History Main Topics  . Smoking status: Current Some Day Smoker    Packs/day: 0.30    Types: Cigarettes  . Smokeless tobacco: Never Used     Comment: pt. given quit line information  . Alcohol use 2.0 oz/week    4 Standard drinks or equivalent per week     Comment: beer  . Drug use: Yes    Types: Cocaine     Comment: crack- last use 03/2012. no h/o IVDU, other illicits  . Sexual activity: Yes    Partners: Male     Comment: patient declined condoms   Other Topics Concern  . None   Social History Narrative  . None   Additional Social History:    Allergies:  No Known Allergies  Labs:  Results for orders placed or performed during the hospital encounter of 06/16/17 (from the past 48 hour(s))  Hepatic function panel     Status: Abnormal   Collection Time: 06/16/17  7:15 PM  Result Value Ref Range   Total Protein 9.7 (H) 6.5 - 8.1 g/dL   Albumin 3.1 (L) 3.5 - 5.0 g/dL   AST  19 15 - 41 U/L   ALT 13 (L) 17 - 63 U/L   Alkaline Phosphatase 58 38 - 126 U/L   Total Bilirubin 0.8 0.3 - 1.2 mg/dL   Bilirubin, Direct 0.2 0.1 - 0.5 mg/dL   Indirect Bilirubin 0.6 0.3 - 0.9 mg/dL  Magnesium     Status: None   Collection Time: 06/16/17  7:15 PM  Result Value Ref Range   Magnesium 2.0 1.7 - 2.4 mg/dL  Phosphorus     Status: None   Collection Time: 06/16/17  7:15 PM  Result Value Ref Range   Phosphorus 3.9 2.5 - 4.6 mg/dL  CK     Status: Abnormal   Collection Time: 06/16/17  7:15 PM  Result Value Ref Range   Total CK 35 (L) 49 - 397 U/L  T-helper cells (CD4) count (not at [facility])     Status: Abnormal   Collection Time: 06/16/17  7:15 PM  Result Value Ref Range   CD4 T Cell Abs 10 (L) 400 - 2,700 /uL   CD4 % Helper T Cell 2 (L) 33  - 55 %    Comment: Performed at [facility], [address], [place], [place] [address]  I-Stat CG4 Lactic Acid, ED     Status: Abnormal   Collection Time: 06/16/17  7:24 PM  Result Value Ref Range   Lactic Acid, Venous 2.03 (HH) 0.5 - 1.9 mmol/L   Comment NOTIFIED PHYSICIAN   Urinalysis, Routine w reflex microscopic     Status: Abnormal   Collection Time: 06/16/17  8:00 PM  Result Value Ref Range   Color, Urine AMBER (A) YELLOW    Comment: BIOCHEMICALS MAY BE AFFECTED BY COLOR   APPearance HAZY (A) CLEAR   Specific Gravity, Urine 1.030 1.005 - 1.030   pH 5.0 5.0 - 8.0   Glucose, UA NEGATIVE NEGATIVE mg/dL   Hgb urine dipstick NEGATIVE NEGATIVE   Bilirubin Urine NEGATIVE NEGATIVE   Ketones, ur 5 (A) NEGATIVE mg/dL   Protein, ur 511 (A) NEGATIVE mg/dL   Nitrite NEGATIVE NEGATIVE   Leukocytes, UA NEGATIVE NEGATIVE   RBC / HPF 0-5 0 - 5 RBC/hpf   WBC, UA 0-5 0 - 5 WBC/hpf   Bacteria, UA RARE (A) NONE SEEN   Squamous Epithelial / LPF 0-5 (A) NONE SEEN   Mucous PRESENT    Hyaline Casts, UA PRESENT   Rapid urine drug screen (hospital performed)     Status: Abnormal   Collection Time: 06/16/17  8:00 PM  Result Value Ref Range   Opiates NONE DETECTED NONE DETECTED   Cocaine POSITIVE (A) NONE DETECTED   Benzodiazepines NONE DETECTED NONE DETECTED   Amphetamines POSITIVE (A) NONE DETECTED   Tetrahydrocannabinol NONE DETECTED NONE DETECTED   Barbiturates NONE DETECTED NONE DETECTED    Comment:        DRUG SCREEN FOR MEDICAL PURPOSES ONLY.  IF CONFIRMATION IS NEEDED FOR ANY PURPOSE, NOTIFY LAB WITHIN 5 DAYS.        LOWEST DETECTABLE LIMITS FOR URINE DRUG SCREEN Drug Class       Cutoff (ng/mL) Amphetamine      1000 Barbiturate      200 Benzodiazepine   200 Tricyclics       300 Opiates          300 Cocaine          300 THC              50   HIV 1  RNA quant-no reflex-bld     Status: None   Collection Time: 06/16/17  8:35 PM  Result Value Ref Range   HIV 1  RNA Quant 8,010 copies/mL    Comment: (NOTE) The reportable range for this assay is 20 to 10,000,000 copies HIV-1 RNA/mL.    LOG10 HIV-1 RNA 3.904 log10copy/mL    Comment: (NOTE) Performed At: [facility] [place], [place] [id number] Lindon Romp MD [id number]   Basic metabolic panel     Status: Abnormal   Collection Time: 06/17/17  4:16 AM  Result Value Ref Range   Sodium 133 (L) 135 - 145 mmol/L   Potassium 3.8 3.5 - 5.1 mmol/L   Chloride 98 (L) 101 - 111 mmol/L   CO2 25 22 - 32 mmol/L   Glucose, Bld 95 65 - 99 mg/dL   BUN 13 6 - 20 mg/dL   Creatinine, Ser 0.54 (L) 0.61 - 1.24 mg/dL   Calcium 8.6 (L) 8.9 - 10.3 mg/dL   GFR calc non Af Amer >60 >60 mL/min   GFR calc Af Amer >60 >60 mL/min    Comment: (NOTE) The eGFR has been calculated using the CKD EPI equation. This calculation has not been validated in all clinical situations. eGFR's persistently <60 mL/min signify possible Chronic Kidney Disease.    Anion gap 10 5 - 15  CBC     Status: Abnormal   Collection Time: 06/17/17  4:16 AM  Result Value Ref Range   WBC 3.3 (L) 4.0 - 10.5 K/uL   RBC 4.38 4.22 - 5.81 MIL/uL   Hemoglobin 11.6 (L) 13.0 - 17.0 g/dL   HCT 35.8 (L) 39.0 - 52.0 %   MCV 81.7 78.0 - 100.0 fL   MCH 26.5 26.0 - 34.0 pg   MCHC 32.4 30.0 - 36.0 g/dL   RDW 13.5 11.5 - 15.5 %   Platelets 140 (L) 150 - 400 K/uL  CBC     Status: Abnormal   Collection Time: 06/18/17 12:29 AM  Result Value Ref Range   WBC 4.0 4.0 - 10.5 K/uL   RBC 4.50 4.22 - 5.81 MIL/uL   Hemoglobin 12.2 (L) 13.0 - 17.0 g/dL   HCT 36.8 (L) 39.0 - 52.0 %   MCV 81.8 78.0 - 100.0 fL   MCH 27.1 26.0 - 34.0 pg   MCHC 33.2 30.0 - 36.0 g/dL   RDW 13.5 11.5 - 15.5 %   Platelets 156 150 - 400 K/uL  Basic metabolic panel     Status: Abnormal   Collection Time: 06/18/17 12:29 AM  Result Value Ref Range   Sodium 131 (L) 135 - 145 mmol/L   Potassium 3.3 (L) 3.5 - 5.1 mmol/L   Chloride 93 (L) 101 - 111 mmol/L    CO2 32 22 - 32 mmol/L   Glucose, Bld 178 (H) 65 - 99 mg/dL   BUN 11 6 - 20 mg/dL   Creatinine, Ser 0.57 (L) 0.61 - 1.24 mg/dL   Calcium 8.8 (L) 8.9 - 10.3 mg/dL   GFR calc non Af Amer >60 >60 mL/min   GFR calc Af Amer >60 >60 mL/min    Comment: (NOTE) The eGFR has been calculated using the CKD EPI equation. This calculation has not been validated in all clinical situations. eGFR's persistently <60 mL/min signify possible Chronic Kidney Disease.    Anion gap 6 5 - 15  Magnesium     Status: None   Collection Time: 06/18/17 12:29 AM  Result Value  Ref Range   Magnesium 1.9 1.7 - 2.4 mg/dL  Phosphorus     Status: Abnormal   Collection Time: 06/18/17 12:29 AM  Result Value Ref Range   Phosphorus 1.5 (L) 2.5 - 4.6 mg/dL  Cryptococcal antigen     Status: Abnormal   Collection Time: 06/18/17 12:29 AM  Result Value Ref Range   Crypto Ag POSITIVE (A) NEGATIVE    Comment: CRITICAL RESULT CALLED TO, READ BACK BY AND VERIFIED WITH: E DODOO RN [phone] 06/18/17 A BROWNING    Cryptococcal Ag Titer >=1:2560 (A) NOT INDICATED  Culture, expectorated sputum-assessment     Status: None   Collection Time: 06/18/17  5:00 AM  Result Value Ref Range   Specimen Description SPUTUM    Special Requests Immunocompromised    Sputum evaluation      Sputum specimen not acceptable for testing.  Please recollect.   Gram Stain Report Called to,Read Back By and Verified With: RN Levi Aland [phone] [id number] MLM    Report Status 06/18/2017 FINAL   Basic metabolic panel     Status: Abnormal   Collection Time: 06/18/17  9:36 AM  Result Value Ref Range   Sodium 129 (L) 135 - 145 mmol/L   Potassium 4.5 3.5 - 5.1 mmol/L    Comment: SLIGHT HEMOLYSIS   Chloride 92 (L) 101 - 111 mmol/L   CO2 28 22 - 32 mmol/L   Glucose, Bld 112 (H) 65 - 99 mg/dL   BUN 11 6 - 20 mg/dL   Creatinine, Ser 5.39 (L) 0.61 - 1.24 mg/dL   Calcium 9.0 8.9 - 71.4 mg/dL   GFR calc non Af Amer >60 >60 mL/min   GFR calc Af Amer >60 >60 mL/min     Comment: (NOTE) The eGFR has been calculated using the CKD EPI equation. This calculation has not been validated in all clinical situations. eGFR's persistently <60 mL/min signify possible Chronic Kidney Disease.    Anion gap 9 5 - 15  Magnesium     Status: None   Collection Time: 06/18/17  9:36 AM  Result Value Ref Range   Magnesium 1.7 1.7 - 2.4 mg/dL  Phosphorus     Status: None   Collection Time: 06/18/17  9:36 AM  Result Value Ref Range   Phosphorus 3.1 2.5 - 4.6 mg/dL    Current Facility-Administered Medications  Medication Dose Route Frequency Provider Last Rate Last Dose  . acetaminophen (TYLENOL) 325 MG tablet           . acetaminophen (TYLENOL) tablet 650 mg  650 mg Oral Q6H PRN Freddrick March, MD   650 mg at 06/18/17 1322   Or  . acetaminophen (TYLENOL) suppository 650 mg  650 mg Rectal Q6H PRN Freddrick March, MD      . Melene Muller ON 06/19/2017] amLODipine (NORVASC) tablet 5 mg  5 mg Oral Daily Beaulah Dinning, MD      . enoxaparin (LOVENOX) injection 30 mg  30 mg Subcutaneous Q24H Freddrick March, MD   Stopped at 06/18/17 1005  . feeding supplement (ENSURE ENLIVE) (ENSURE ENLIVE) liquid 237 mL  237 mL Oral TID BM Nestor Ramp, MD   237 mL at 06/18/17 1441  . hydrALAZINE (APRESOLINE) injection 5 mg  5 mg Intravenous Q6H PRN Freddrick March, MD   5 mg at 06/18/17 1067  . hydrochlorothiazide (MICROZIDE) capsule 12.5 mg  12.5 mg Oral Daily Beaulah Dinning, MD   12.5 mg at 06/18/17 1058  . lidocaine (PF) (XYLOCAINE) 1 %  injection           . multivitamin with minerals tablet 1 tablet  1 tablet Oral Daily Dickie La, MD   1 tablet at 06/18/17 1021  . potassium chloride SA (K-DUR,KLOR-CON) CR tablet 20 mEq  20 mEq Oral BID Everrett Coombe, MD      . promethazine (PHENERGAN) tablet 12.5 mg  12.5 mg Oral Q6H PRN Lovenia Kim, MD        Musculoskeletal: Strength & Muscle Tone: decreased Gait & Station: unable to stand Patient leans: N/A  Psychiatric Specialty Exam: Physical  Exam  Nursing note and vitals reviewed. Constitutional:  Appears malnourished and underweight  Cardiovascular: Normal rate.   Neurological:  Patient kept closing his eyes, would not answer any questions    Review of Systems  Unable to perform ROS: Other  Patient would not answer my questions  Blood pressure (!) 144/94, pulse 92, temperature 97.7 F (36.5 C), temperature source Oral, resp. rate (!) 22, height '5\' 2"'$  (1.575 m), weight 44.2 kg (97 lb 6.4 oz), SpO2 94 %.Body mass index is 17.81 kg/m.  General Appearance: Disheveled  Eye Contact:  Absent  Speech:  Negative  Volume:  Would not speak  Mood:  Negative  Affect:  Flat  Thought Process:  NA  Orientation:  Negative  Thought Content:  Negative  Suicidal Thoughts:  Would not answer  Homicidal Thoughts:  Would not answer  Memory:  Negative  Judgement:  Negative  Insight:  Negative  Psychomotor Activity:  Negative  Concentration:  Negative  Recall:  Negative  Fund of Knowledge:  Negative  Language:  NA  Akathisia:  NA  Handed:  Right  AIMS (if indicated):     Assets:  Unknown  ADL's:  Impaired  Cognition:  Impaired,  Severe  Sleep:        Treatment Plan Summary: Patient's capacity could not be assessed with patient's condition. Re-evaluate upon patient being more alert and oriented.  Disposition: No evidence of imminent risk to self or others at present.    [place], FNP 06/18/2017 3:53 PM

## 2017-06-18 NOTE — Progress Notes (Signed)
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 Recommend retrying at a later time. No hydrocephalus or mass lesions noted. -Will consider starting amphotericin and flucytosine following LP for induction therapy  Patient's creatinine is 0.46 (8/2) -AFB pending  Cachexia associated with AIDS Patient's weakness and cachexia are likely due to his AIDS and poor po intake -Patient's CD4 count (06/11/17) is untetectable, last viral load (2016)=[id number] -Will not start anti-retrovirals now due to possible immune reconstitution syndrome   . [START ON 06/19/2017] amLODipine  5 mg Oral Daily  . enoxaparin (LOVENOX) injection  30 mg Subcutaneous Q24H  . feeding supplement (ENSURE ENLIVE)  237 mL Oral TID BM  . hydrochlorothiazide  12.5 mg Oral Daily  . multivitamin with minerals  1 tablet Oral Daily  . potassium chloride  20 mEq Oral BID    SUBJECTIVE: [name] was seen doing well. He recalls feeling tired and having neck rigidity. He does not recall having any mood changes, headaches, sob, or chest pain. He states that he has not been taking his HIV medications and Hep C medications.  Review of Systems: ROS unremarkable except above  No Known Allergies  OBJECTIVE: Vitals:   06/18/17 0605 06/18/17 0754 06/18/17 0948 06/18/17 1058  BP: (!) 166/110 (!) 161/97 (!) 160/107 (!) 160/100  Pulse: 90 80 88   Resp: 19  18   Temp: 98.4 F (36.9 C)  98.2 F (36.8  C)   TempSrc: Oral  Oral   SpO2: 95%  99% 98%  Weight:      Height:       Body mass index is 17.81 kg/m.  Physical Exam  Constitutional: He appears malnourished. He appears not jaundiced. He appears cachectic.  HENT:  Head: Normocephalic and atraumatic.  Eyes: Conjunctivae are normal.  Cardiovascular: Regular rhythm, normal heart sounds, intact distal pulses and normal pulses.   Pulmonary/Chest: Effort normal and breath sounds normal.  Skin: Skin is dry. He is not diaphoretic.  Psychiatric: Mood and affect normal.    Lab Results Lab Results  Component Value Date   WBC 4.0 06/18/2017   HGB 12.2 (L) 06/18/2017   HCT 36.8 (L) 06/18/2017   MCV 81.8 06/18/2017   PLT 156 06/18/2017    Lab Results  Component Value Date   CREATININE 0.46 (L) 06/18/2017   BUN 11 06/18/2017   NA 129 (L) 06/18/2017   K 4.5 06/18/2017   CL 92 (L) 06/18/2017   CO2 28 06/18/2017    Lab Results  Component Value Date   ALT 13 (L) 06/16/2017   AST 19 06/16/2017   ALKPHOS 58 06/16/2017   BILITOT 0.8 06/16/2017     Microbiology: Recent Results (from the past 240 hour(s))  Culture, expectorated sputum-assessment     Status: None   Collection Time: 06/18/17  5:00 AM  Result Value Ref Range Status   Specimen Description SPUTUM  Final  Special Requests Immunocompromised  Final   Sputum evaluation   Final    Sputum specimen not acceptable for testing.  Please recollect.   Gram Stain Report Called to,Read Back By and Verified With: RN Kenneth Rivas [phone] MLM    Report Status 06/18/2017 FINAL  Final    Kenneth CourierVahini Reilley Valentine, MD [facility] [phone] pager   [phone] 06/18/2017, 11:42 AM

## 2017-06-19 ENCOUNTER — Inpatient Hospital Stay (HOSPITAL_COMMUNITY): Payer: Medicaid Other

## 2017-06-19 DIAGNOSIS — E878 Other disorders of electrolyte and fluid balance, not elsewhere classified: Secondary | ICD-10-CM

## 2017-06-19 DIAGNOSIS — B459 Cryptococcosis, unspecified: Secondary | ICD-10-CM

## 2017-06-19 DIAGNOSIS — J851 Abscess of lung with pneumonia: Secondary | ICD-10-CM

## 2017-06-19 DIAGNOSIS — J852 Abscess of lung without pneumonia: Secondary | ICD-10-CM

## 2017-06-19 DIAGNOSIS — B451 Cerebral cryptococcosis: Secondary | ICD-10-CM

## 2017-06-19 LAB — CBC
HEMATOCRIT: 37.1 % — AB (ref 39.0–52.0)
HEMOGLOBIN: 12.3 g/dL — AB (ref 13.0–17.0)
MCH: 26.7 pg (ref 26.0–34.0)
MCHC: 33.2 g/dL (ref 30.0–36.0)
MCV: 80.7 fL (ref 78.0–100.0)
Platelets: 108 10*3/uL — ABNORMAL LOW (ref 150–400)
RBC: 4.6 MIL/uL (ref 4.22–5.81)
RDW: 13.6 % (ref 11.5–15.5)
WBC: 4.3 10*3/uL (ref 4.0–10.5)

## 2017-06-19 LAB — POCT I-STAT 3, ART BLOOD GAS (G3+)
Acid-Base Excess: 10 mmol/L — ABNORMAL HIGH (ref 0.0–2.0)
Bicarbonate: 32.9 mmol/L — ABNORMAL HIGH (ref 20.0–28.0)
O2 Saturation: 100 %
Patient temperature: 98.6
TCO2: 34 mmol/L (ref 0–100)
pCO2 arterial: 37.7 mmHg (ref 32.0–48.0)
pH, Arterial: 7.549 — ABNORMAL HIGH (ref 7.350–7.450)
pO2, Arterial: 503 mmHg — ABNORMAL HIGH (ref 83.0–108.0)

## 2017-06-19 LAB — CSF CELL COUNT WITH DIFFERENTIAL
Lymphs, CSF: 90 % — ABNORMAL HIGH (ref 40–80)
Lymphs, CSF: 97 % — ABNORMAL HIGH (ref 40–80)
MONOCYTE-MACROPHAGE-SPINAL FLUID: 10 % — AB (ref 15–45)
Monocyte-Macrophage-Spinal Fluid: 3 % — ABNORMAL LOW (ref 15–45)
RBC COUNT CSF: 2 /mm3 — AB
RBC Count, CSF: 1 /mm3 — ABNORMAL HIGH
Tube #: 4
WBC CSF: 11 /mm3 — AB (ref 0–5)
WBC, CSF: 12 /mm3 (ref 0–5)

## 2017-06-19 LAB — BASIC METABOLIC PANEL
ANION GAP: 7 (ref 5–15)
BUN: 8 mg/dL (ref 6–20)
CALCIUM: 8.7 mg/dL — AB (ref 8.9–10.3)
CHLORIDE: 86 mmol/L — AB (ref 101–111)
CO2: 29 mmol/L (ref 22–32)
Creatinine, Ser: 0.47 mg/dL — ABNORMAL LOW (ref 0.61–1.24)
GFR calc non Af Amer: >60 mL/min (ref >60)
Glucose, Bld: 109 mg/dL — ABNORMAL HIGH (ref 65–99)
POTASSIUM: 3.6 mmol/L (ref 3.5–5.1)
Sodium: 122 mmol/L — ABNORMAL LOW (ref 135–145)

## 2017-06-19 LAB — ACID FAST SMEAR (AFB, MYCOBACTERIA): Acid Fast Smear: NEGATIVE

## 2017-06-19 LAB — CRYPTOCOCCAL ANTIGEN, CSF: Crypto Ag: POSITIVE — AB

## 2017-06-19 LAB — MAGNESIUM: Magnesium: 1.7 mg/dL (ref 1.7–2.4)

## 2017-06-19 LAB — ACID FAST SMEAR (AFB): ACID FAST SMEAR - AFSCU2: NEGATIVE

## 2017-06-19 LAB — GLUCOSE, CAPILLARY: GLUCOSE-CAPILLARY: 111 mg/dL — AB (ref 65–99)

## 2017-06-19 LAB — MRSA PCR SCREENING: MRSA BY PCR: NEGATIVE

## 2017-06-19 LAB — PHOSPHORUS: PHOSPHORUS: 3.1 mg/dL (ref 2.5–4.6)

## 2017-06-19 LAB — SODIUM, URINE, RANDOM: Sodium, Ur: 35 mmol/L

## 2017-06-19 LAB — PROTEIN AND GLUCOSE, CSF
Glucose, CSF: 36 mg/dL — ABNORMAL LOW (ref 40–70)
Total  Protein, CSF: 68 mg/dL — ABNORMAL HIGH (ref 15–45)

## 2017-06-19 MED ORDER — DEXTROSE 5% FOR FLUSHING BEFORE AND AFTER AMBISOME
10.0000 mL | INTRAVENOUS | Status: DC
  Administered 2017-06-19: 21:00:00 via INTRAVENOUS
  Administered 2017-06-20 – 2017-07-12 (×23): 10 mL via INTRAVENOUS
  Filled 2017-06-19 (×29): qty 10

## 2017-06-19 MED ORDER — FENTANYL 2500MCG IN NS 250ML (10MCG/ML) PREMIX INFUSION
25.0000 ug/h | INTRAVENOUS | Status: DC
  Administered 2017-06-19: 200 ug/h via INTRAVENOUS
  Administered 2017-06-20: 150 ug/h via INTRAVENOUS
  Administered 2017-06-20 – 2017-06-22 (×2): 100 ug/h via INTRAVENOUS
  Filled 2017-06-19 (×5): qty 250

## 2017-06-19 MED ORDER — PANTOPRAZOLE SODIUM 40 MG IV SOLR
40.0000 mg | Freq: Every day | INTRAVENOUS | Status: DC
  Administered 2017-06-20 – 2017-06-22 (×4): 40 mg via INTRAVENOUS
  Filled 2017-06-19 (×5): qty 40

## 2017-06-19 MED ORDER — ACETAMINOPHEN 325 MG PO TABS
650.0000 mg | ORAL_TABLET | Freq: Four times a day (QID) | ORAL | Status: DC
  Administered 2017-06-19 – 2017-06-20 (×4): 650 mg via ORAL
  Filled 2017-06-19 (×4): qty 2

## 2017-06-19 MED ORDER — FENTANYL CITRATE (PF) 100 MCG/2ML IJ SOLN
INTRAMUSCULAR | Status: AC
  Administered 2017-06-19: 13:00:00
  Filled 2017-06-19: qty 2

## 2017-06-19 MED ORDER — FENTANYL CITRATE (PF) 100 MCG/2ML IJ SOLN: 100.0000 ug | INTRAMUSCULAR | Status: DC | PRN

## 2017-06-19 MED ORDER — PIPERACILLIN-TAZOBACTAM 3.375 G IVPB 30 MIN
3.3750 g | Freq: Once | INTRAVENOUS | Status: AC
  Administered 2017-06-19: 3.375 g via INTRAVENOUS
  Filled 2017-06-19: qty 50

## 2017-06-19 MED ORDER — LIDOCAINE HCL (PF) 1 % IJ SOLN
INTRAMUSCULAR | Status: AC
Start: 2017-06-19 — End: 2017-06-19
  Administered 2017-06-19: 5 mL
  Filled 2017-06-19: qty 5

## 2017-06-19 MED ORDER — VANCOMYCIN HCL IN DEXTROSE 1-5 GM/200ML-% IV SOLN
1000.0000 mg | Freq: Once | INTRAVENOUS | Status: AC
  Administered 2017-06-19: 1000 mg via INTRAVENOUS
  Filled 2017-06-19: qty 200

## 2017-06-19 MED ORDER — MIDAZOLAM HCL 2 MG/2ML IJ SOLN
2.0000 mg | INTRAMUSCULAR | Status: DC | PRN
  Administered 2017-06-20: 2 mg via INTRAVENOUS
  Filled 2017-06-19: qty 2

## 2017-06-19 MED ORDER — SODIUM CHLORIDE 0.9 % IV BOLUS FOR AMBISOME
500.0000 mL | INTRAVENOUS | Status: DC
  Filled 2017-06-19: qty 500

## 2017-06-19 MED ORDER — SODIUM CHLORIDE 0.9 % IV BOLUS (SEPSIS)
500.0000 mL | INTRAVENOUS | Status: DC
  Administered 2017-06-20 – 2017-07-09 (×19): 500 mL via INTRAVENOUS
  Administered 2017-07-09: 23:00:00 via INTRAVENOUS

## 2017-06-19 MED ORDER — ORAL CARE MOUTH RINSE
15.0000 mL | Freq: Four times a day (QID) | OROMUCOSAL | Status: DC
  Administered 2017-06-19 – 2017-06-27 (×31): 15 mL via OROMUCOSAL

## 2017-06-19 MED ORDER — DEXTROSE 5 % IV SOLN
4.0000 mg/kg | INTRAVENOUS | Status: DC
  Administered 2017-06-19 – 2017-06-21 (×3): 180 mg via INTRAVENOUS
  Filled 2017-06-19 (×3): qty 180

## 2017-06-19 MED ORDER — DEXTROSE 5% FOR FLUSHING BEFORE AND AFTER AMBISOME
10.0000 mL | INTRAVENOUS | Status: DC
  Administered 2017-06-20 – 2017-06-30 (×12): 10 mL via INTRAVENOUS
  Filled 2017-06-19 (×13): qty 10

## 2017-06-19 MED ORDER — FENTANYL BOLUS VIA INFUSION
50.0000 ug | INTRAVENOUS | Status: DC | PRN
  Filled 2017-06-19: qty 50

## 2017-06-19 MED ORDER — FENTANYL CITRATE (PF) 100 MCG/2ML IJ SOLN
50.0000 ug | Freq: Once | INTRAMUSCULAR | Status: AC
  Administered 2017-06-19: 50 ug via INTRAVENOUS

## 2017-06-19 MED ORDER — CHLORHEXIDINE GLUCONATE 0.12% ORAL RINSE (MEDLINE KIT)
15.0000 mL | Freq: Two times a day (BID) | OROMUCOSAL | Status: DC
  Administered 2017-06-19 – 2017-06-27 (×16): 15 mL via OROMUCOSAL

## 2017-06-19 MED ORDER — AZITHROMYCIN 600 MG PO TABS
1200.0000 mg | ORAL_TABLET | ORAL | Status: DC
  Administered 2017-06-20 – 2017-06-27 (×2): 1200 mg
  Filled 2017-06-19 (×2): qty 2

## 2017-06-19 MED ORDER — SODIUM CHLORIDE 0.9 % IV BOLUS (SEPSIS)
500.0000 mL | Freq: Once | INTRAVENOUS | Status: AC
  Administered 2017-06-19: 500 mL via INTRAVENOUS

## 2017-06-19 MED ORDER — FENTANYL CITRATE (PF) 100 MCG/2ML IJ SOLN
100.0000 ug | Freq: Once | INTRAMUSCULAR | Status: AC
  Administered 2017-06-19: 100 ug via INTRAVENOUS
  Filled 2017-06-19: qty 2

## 2017-06-19 MED ORDER — VANCOMYCIN HCL 500 MG IV SOLR
500.0000 mg | Freq: Two times a day (BID) | INTRAVENOUS | Status: DC
  Administered 2017-06-20 – 2017-06-21 (×3): 500 mg via INTRAVENOUS
  Filled 2017-06-19 (×4): qty 500

## 2017-06-19 MED ORDER — SODIUM CHLORIDE 0.9 % IV BOLUS (SEPSIS): 500.0000 mL | INTRAVENOUS | Status: DC

## 2017-06-19 MED ORDER — PROPOFOL 1000 MG/100ML IV EMUL
5.0000 ug/kg/min | INTRAVENOUS | Status: DC
  Administered 2017-06-18: 30 ug/kg/min via INTRAVENOUS
  Filled 2017-06-19 (×2): qty 100

## 2017-06-19 MED ORDER — MIDAZOLAM HCL 2 MG/2ML IJ SOLN
2.0000 mg | INTRAMUSCULAR | Status: DC | PRN
  Administered 2017-06-24: 2 mg via INTRAVENOUS
  Filled 2017-06-19: qty 2

## 2017-06-19 MED ORDER — PIPERACILLIN-TAZOBACTAM 3.375 G IVPB
3.3750 g | Freq: Three times a day (TID) | INTRAVENOUS | Status: DC
  Administered 2017-06-19 – 2017-06-21 (×5): 3.375 g via INTRAVENOUS
  Filled 2017-06-19 (×7): qty 50

## 2017-06-19 MED ORDER — AMLODIPINE BESYLATE 5 MG PO TABS
5.0000 mg | ORAL_TABLET | Freq: Every day | ORAL | Status: DC
  Administered 2017-06-20 – 2017-06-30 (×11): 5 mg
  Filled 2017-06-19 (×11): qty 1

## 2017-06-19 MED ORDER — SODIUM CHLORIDE 0.9 % IV BOLUS (SEPSIS)
500.0000 mL | INTRAVENOUS | Status: DC
  Administered 2017-06-19 – 2017-07-09 (×21): 500 mL via INTRAVENOUS

## 2017-06-19 MED ORDER — SODIUM CHLORIDE 0.9 % IV SOLN
INTRAVENOUS | Status: DC
  Administered 2017-06-19 – 2017-06-25 (×8): via INTRAVENOUS
  Administered 2017-06-25 (×2): 75 mL/h via INTRAVENOUS
  Administered 2017-06-26 – 2017-07-03 (×8): via INTRAVENOUS

## 2017-06-19 MED ORDER — SULFAMETHOXAZOLE-TRIMETHOPRIM 800-160 MG PO TABS
1.0000 | ORAL_TABLET | Freq: Every day | ORAL | Status: DC
  Administered 2017-06-20 – 2017-07-02 (×13): 1
  Filled 2017-06-19 (×13): qty 1

## 2017-06-19 MED ORDER — FENTANYL CITRATE (PF) 100 MCG/2ML IJ SOLN
100.0000 ug | INTRAMUSCULAR | Status: DC | PRN
  Administered 2017-06-24: 50 ug via INTRAVENOUS
  Filled 2017-06-19: qty 2

## 2017-06-19 NOTE — Progress Notes (Signed)
PT Cancellation Note  Patient Details Name: Kenneth Rivas MRN: [id number] DOB: Oct 05, 1961   Cancelled Treatment:    Reason Eval/Treat Not Completed: Patient declined, no reason specified Pt declined mobility reporting he is too tired and weak to get OOB or do exercises. Pt educated on benefits of mobility and given max encouragement. PT will continue to follow acutely.    Derek MoundKellyn R George Alcantar Joselito Fieldhouse, PTA Pager: [phone]   06/19/2017, 8:51 AM

## 2017-06-19 NOTE — Procedures (Signed)
Intubation Procedure Note Kenneth Rivas [id number] 03/24/1961  Procedure: Intubation Indications: Airway protection and maintenance  Procedure Details Consent: Risks of procedure as well as the alternatives and risks of each were explained to the (patient/caregiver).  Consent for procedure obtained. Time Out: Verified patient identification, verified procedure, site/side was marked, verified correct patient position, special equipment/implants available, medications/allergies/relevent history reviewed, required imaging and test results available.  Performed  Maximum sterile technique was used including cap, gloves, gown, hand hygiene and mask.  MAC and 4    Evaluation Hemodynamic Status: BP stable throughout; O2 sats: stable throughout Patient's Current Condition: stable Complications: No apparent complications Patient did tolerate procedure well. Chest X-ray ordered to verify placement.  CXR: pending.   [name] 06/19/2017  [name] Titus Mould, MD, [place] Pgr: [facility]

## 2017-06-19 NOTE — Progress Notes (Signed)
Family Medicine Teaching Service Daily Progress Note Intern Pager: [phone]  Patient name: Kenneth Rivas Medical record number: [id number] Date of birth: 08-09-1961 Age: 56 y.o. Gender: male  Primary Care Provider: Tommy Medal, Lavell Islam, MD Consultants: ID Code Status: FULL  Pt Overview and Major Events to Date:  Admitted on 7/31, CD4 count10 On 8/1, found to have cryptococcal antigen in serum Being transferred to ICU temporarily for LP due to risk of intubation/vent   Assessment and Plan:  Kenneth Rivas a 56 y.o.malepresenting with cachexia and feeling weak and tired x past 4 weeks. PMH is significant for AIDS, hep B, cocaine use, and HTN.   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 Was recently seen in [facility] ED last week for same however left AMA prior to being admitted. In ED, UDS+ for cocaine and amphetamines.  Has not been taking HIV or hepatitis medications. Last CD4 count is undetectable on 06/11/2017. Suspect current picture likely 2/2 immunocompromised condition, poor po intake and poor compliance with HIV medication and follow up. On home med list has HIV medications listed which he is not taking -- will hold for now and defer to ID when to restart.  Patient placed in negative pressure room with airborne and contact precautions.  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 Last viral load in 2016 is 14,034. CXR showed no alveolar PNA, however tiny parenchymal nodules seen bilaterally, suspect infectious vs neoplastic  process. CT chest on 8/1 shows cavitary lesion in right middle lobe.  Serum + for cryptococcus.  Episode of bloody sputum on 8/1.  Blood cultures pending - Cavitary Chest Lesion:  -AFB culture and smear pending - CT head: -head CT showed hypoechoic lesion -LP to rule out cryptococcal meningitis (did not tolerate prone position in radiology, was performed 8/3 in icu) - csf labs pending (glucose/protein, csf culture, csf cell count, jc virus) -follow-up blood cultures -follow-up sputum culture - specimen from 8/2 at 0500 not adequate  AMS -psych could not determine capacity   Lactic acidosis. On admission with LA of 2.90, suspect likely 2/2 dehydration rather than infectious etiology given no white count and pt afebrile. Improved with fluids to 2.03.  -no longer trending, will repeat if clinical picture changes  HTN. On arrival with elevated BP to 173/117. At home on Norvasc 5 mg daily however patient reports as not taking. In ED remains with elevated pressures in 355H systolic.  Most current BP 161/97 on 8/2 -Continue home Norvasc to 5 mg -NO HCTZ for concerns of hypokalemia -Add Hydral 5 mg with parameters: SBP>170 or DBP>110  -Monitor blood pressures  Hepatitis B. Not currently taking medication and does not follow with Infectious Disease. LFTs with AST 19, ALT 13 and alk phos 58. Normal total bili 0.8 and direct bili 0.2.  -Consult ID as above  -Continue to monitor   Hypokalemia. On admission with K+ 3.2. In ED given K-dur 40 mEq x 1 dose. At home on K-dur 20 mEq BID. K+ 3.3 on 8/2 -Continue home [place] -replete as necessary  -Daily  BMET  Hyponatremia.8/2 is 122, On admission with Na 130.   Na 133 on 8/1. -ordering urine sodium, TSH, cortisol -Daily BMET   H/o drug use.In ED, UDS +for cocaine and amphetamines. May be contributing to his elevated BP on admission depending on time of ingestion.  -CSW consulted   Bedbugs- Reported per EMS on arrival. Pt  currently stays in a rooming house.  -Contact precautions placed -CSW consulted to assess for further needs    FEN/GI: Regular diet Prophylaxis: Lovenox   Disposition:Continue on med-surg   Subjective:  [name] says that he feels fine this morning but does not have much of an appetite.  He denies cough or shortness of breath.  His only physical complaint is shoulder pain.   He says he is ok with lumbar puncture but cannot fully teachback reasons why  Objective: Temp:  [97.7 F (36.5 C)-98.2 F (36.8 C)] 98.2 F (36.8 C) (08/03 6269) Pulse Rate:  [80-92] 83 (08/03 0608) Resp:  [15-22] 16 (08/03 4854) BP: (144-172)/(94-117) 159/100 (08/02 2148) SpO2:  [94 %-99 %] 96 % (08/03 6270) Physical Exam: General: ill appearing and frail, not talkative but willing to respond Cardiovascular: visible JVD, cap refil ~3, pulses regular rate, no disposable stethoscope in exam room Respiratory: no visible increase resp. Effort, no coughing during exam Abdomen: soft belly to exam, frail/withdrawn, no tenderness to palpation Extremities: Frail, nails cracked w/ nail fungus, visible bony joints  Laboratory:  Recent Labs Lab 06/17/17 0416 06/18/17 0029 06/19/17 0433  WBC 3.3* 4.0 4.3  HGB 11.6* 12.2* 12.3*  HCT 35.8* 36.8* 37.1*  PLT 140* 156 PENDING    Recent Labs Lab 06/16/17 1915  06/18/17 0029 06/18/17 0936 06/19/17 0433  NA  --   < > 131* 129* 122*  K  --   < > 3.3* 4.5 3.6  CL  --   < > 93* 92* 86*  CO2  --   < > 32 28 29  BUN  --   < > '11 11 8  '$ CREATININE  --   < > 0.57* 0.46* 0.47*  CALCIUM  --   < > 8.8* 9.0 8.7*  PROT 9.7*  --   --   --   --   BILITOT 0.8  --   --   --   --   ALKPHOS 58  --   --   --   --   ALT 13*  --   --   --   --   AST 19  --   --   --   --   GLUCOSE  --   < > 178* 112* 109*  < > = values in this interval not displayed.    Imaging/Diagnostic Tests: Ct Chest W Contrast  Result Date: 06/17/2017 CLINICAL DATA:  Shortness of breath.  EXAM: CT CHEST WITH CONTRAST TECHNIQUE: Multidetector CT imaging of the chest was performed during intravenous contrast administration. CONTRAST:  49m ISOVUE-300 IOPAMIDOL (ISOVUE-300) INJECTION 61% COMPARISON:  Radiographs of June 16, 2017. FINDINGS: Cardiovascular: Normal heart size. No pericardial effusion. Atherosclerosis of thoracic aorta is noted. No dissection or aneurysm is noted. Mediastinum/Nodes: No enlarged mediastinal, hilar, or axillary lymph nodes. Thyroid gland, trachea, and esophagus demonstrate no significant findings. Lungs/Pleura: No pneumothorax or pleural effusion is noted. 3 mm nodule is noted in left upper lobe best seen on image number 54 of series 4. 7 mm cavitating abnormality is noted in the right middle lobe best seen on image number 104  series 4. Upper Abdomen: No acute abnormality. Musculoskeletal: No chest wall abnormality. No acute or significant osseous findings. IMPRESSION: 7 mm cavitary lesion is seen in the right middle lobe. It is uncertain if this represents cavitary metastatic lesion or small pulmonary abscess. Clinical correlation is recommended. Follow-up CT scan in 6-8 weeks is recommended to determine stability or resolution. Aortic Atherosclerosis (ICD10-I70.0). Electronically Signed   By: Marijo Conception, M.D.   On: 06/17/2017 12:04   Dg Chest Portable 1 View  Result Date: 06/16/2017 CLINICAL DATA:  Three weeks of weakness, anorexia, emaciation. History of hepatitis-B, HIV-AIDS. EXAM: PORTABLE CHEST 1 VIEW COMPARISON:  Chest x-ray of June 11, 2017 FINDINGS: The lungs are hyperinflated. There is no focal infiltrate. There are subtle nodules of varying sizes up to 3 mm in diameter in both lungs. Some likely reflect pulmonary vessels on the end but others are more peripheral. The heart and pulmonary vascularity are normal. The mediastinum is normal in width. There is no pleural effusion. IMPRESSION: No classic alveolar pneumonia. No CHF. Tiny pulmonary parenchymal  nodules are suspected bilaterally and may reflect infectious or neoplastic processes. Chest CT scanning is recommended. Electronically Signed   By: David  Martinique M.D.   On: 06/16/2017 16:45     Sherene Sires, DO 06/19/2017, 6:09 AM PGY-1, [place] Intern pager: [phone], text pages welcome

## 2017-06-19 NOTE — Progress Notes (Signed)
Update pt is ok to move to ICU , Stable for Lumbar Puncture with sedation, Called to get Report, Nurse is busy and will call back

## 2017-06-19 NOTE — Procedures (Signed)
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 Risks of the procedure were discussed including: infection, bleeding, pain and headache.  The patient was positioned under sterile conditions. Betadine solution and sterile drapes were utilized. A spinal needle was inserted at the L4 - L5 interspace or 1 space lower because ofr DJD large L5 with no space fused? Spinal fluid was obtained and sent to the laboratory.  Findings 10mL of clear spinal fluid was obtained. Opening Pressure: 47 cm H2O pressure. Closing Pressure: 17 cm H2O pressure. Removed 10 cc  In total to get to the closing pressure of 17  Complications:  None; patient tolerated the procedure well.        Condition: stable Min to no Blood loss Plan Bed rest for 5 hours.  Calling NS to get opinion on utility lumbar drain  Mcarthur RossettiDaniel J. Tyson AliasFeinstein, MD, FACP Pgr: [phone] [facility]

## 2017-06-19 NOTE — Progress Notes (Signed)
OT Cancellation Note  Patient Details Name: Kenneth Rivas MRN: [id number] DOB: [birth year]   Cancelled Treatment:    Reason Eval/Treat Not Completed: Medical issues which prohibited therapy;Patient at procedure or test/ unavailable.  Pt is now intubated and underwent LP.  Will reattempt.  Christabella Alvira Miltononarpe, OTR/L [phone]   [name], Kenneth Rivas 06/19/2017, 2:49 PM

## 2017-06-19 NOTE — Procedures (Signed)
Bronchoscopy Procedure Note Kenneth Rivas [id number] [birth year]  Procedure: Bronchoscopy Indications: Diagnostic evaluation of the airways  Procedure Details Consent: Risks of procedure as well as the alternatives and risks of each were explained to the (patient/caregiver).  Consent for procedure obtained. Time Out: Verified patient identification, verified procedure, site/side was marked, verified correct patient position, special equipment/implants available, medications/allergies/relevent history reviewed, required imaging and test results available.  Performed  In preparation for procedure, patient was given 100% FiO2 and bronchoscope lubricated. Sedation: prop  Airway entered and the following bronchi were examined: RUL, RML, RLL, LUL, LLL and Bronchi.   Procedures performed: Brushings performed - no Bronchoscope removed.  , Patient placed back on 100% FiO2 at conclusion of procedure.    Evaluation Hemodynamic Status: BP stable throughout; O2 sats: stable throughout Patient's Current Condition: stable Specimens:  Sent purulent fluid Complications: No apparent complications Patient did tolerate procedure well.   Kenneth Rivas. 06/19/2017   1. Suprisingly his entire Bronchus interm obstructed thick yellow pus ( favor abscess lung, not afb) 2. Obstructed rll, rml, cleared 3. BAL RML pus  Kenneth Rivas. Titus Mould, MD, [place] Pgr: [facility]

## 2017-06-19 NOTE — Progress Notes (Signed)
Pharmacy Antibiotic Note  Kenneth Rivas is a 56 y.o. male admitted on 06/16/2017 with AIDS and failure to thrive. Pt with acute cryptococcal bloodstream infection also with cavitary pulmonary nodule. Pt transferred to the ICU as he required intubation to be able to obtain LP. Pt was already started on anti-cryptococcal medications yesterday, now vancomycin and zosyn are being added for lung infection. Bactrim/Azithro on board for prophylaxis given barely detectable CD4 count.  Pt is at high risk of developing kidney injury given combination of vanc/zosyn/amphotericin and bactrim. Watch Scr + uop closely. Nurses will have to adhere closely to fluid administration before and after amphotericin administration - this was discussed with current RN. Mg/K abnormalities are common with amphotericin, this patient's hyponatremia is likely due to cryptococcal infectious process, unlikely drug-related at this time.   Plan: -Vancomycin 1 g IV x1 then 500/12h -Zosyn 3.375 g IV q8h -Monitor renal fx, cultures, VT at Css -F/u LP and cultures -Continue amphotericin, bactrim, azithro   Height: _0  (167.6 cm) Weight: 97 lb 7.1 oz (44.2 kg) IBW/kg (Calculated) : 63.8  Temp (24hrs), Avg:98.1 F (36.7 C), Min:97.7 F (36.5 C), Max:98.4 F (36.9 C)   Recent Labs Lab 06/16/17 1345 06/16/17 1409 06/16/17 1924 06/17/17 0416 06/18/17 0029 06/18/17 0936 06/19/17 0433  WBC 3.4*  --   --  3.3* 4.0  --  4.3  CREATININE 0.84  --   --  0.54* 0.57* 0.46* 0.47*  LATICACIDVEN  --  2.90* 2.03*  --   --   --   --      Antimicrobials this admission: 8/4 Azithromycin > 8/4 Bactrim > 8/3 vancomycin > 8/3 zosyn > 8/2 amphotericin B > 8/2 flucytosine >  Dose adjustments this admission: N/A  Microbiology results: 8/3 AFB: 8/2 blood cx: 8/2 sputum: 8/2 BAL: 8/2 LP: 8/2 Crypto Ag: positive   Adrienne Delay, Jake Church 06/19/2017 1:32 PM

## 2017-06-19 NOTE — Progress Notes (Signed)
Nutrition Follow-up  DOCUMENTATION CODES:   Underweight, Severe malnutrition in context of chronic illness  INTERVENTION:   Recommend initiate enteral nutrition therapy: Vital AF 1.2 @ 20 ml/hr and increase by 10 ml every 12 hours to goal rate of 50 ml/hr (1200 ml/day) Provides: 1440 kcal, 90 grams protein, and 973 ml free water.   Monitor magnesium and phosphorus every 12 hours x 4 occurences, MD to replete as needed, as pt is at risk for refeeding syndrome given severe malnutrition.   NUTRITION DIAGNOSIS:   Malnutrition (Severe) related to chronic illness (HIV) as evidenced by severe depletion of body fat, severe depletion of muscle mass, energy intake < 75% for > or equal to 1 month. Ongoing.   GOAL:   Patient will meet greater than or equal to 90% of their needs Not met.   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 PMH is significant for AIDS, hep B, cocaine use, and HTN.    Meal Completion: <10% this admission Spoke with RN. No family present.  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 Pt on airborne precautions to rule out TB. May need lumber drain due to pressure. Neurosurgery consulted.   Patient is currently intubated on ventilator support MV: 10.1 L/min Temp (24hrs), Avg:98.3 F (36.8 C), Min:98.2 F (36.8 C), Max:98.4 F (36.9 C)  Labs reviewed: Na 122, K+/PO4/Magnesium WNL   Diet Order:  Diet NPO time specified  Skin:  Reviewed, no issues  Last BM:  06/16/17  Height:   Ht Readings from Last 1 Encounters:  06/19/17 _0  (1.676 m)    Weight:   Wt Readings from Last 1 Encounters:  06/19/17 97 lb 7.1 oz (44.2 kg)    Ideal Body Weight:  50 kg  BMI:  Body mass index is 15.73 kg/m.  Estimated Nutritional Needs:   Kcal:  1423  Protein:  75-90  grams  Fluid:  > 1.5 L  EDUCATION NEEDS:   Education needs addressed  Maylon Peppers RD, [place], [place] Pager [phone] After Hours Pager

## 2017-06-19 NOTE — Progress Notes (Signed)
Subjective: Patient was complaining of a headache this morning when I examined him   Antibiotics:  Anti-infectives    Start     Dose/Rate Route Frequency Ordered Stop   06/20/17 1000  sulfamethoxazole-trimethoprim (BACTRIM DS,SEPTRA DS) 800-160 MG per tablet 1 tablet     1 tablet Per Tube Daily 06/19/17 1439     06/20/17 1000  azithromycin (ZITHROMAX) tablet 1,200 mg     1,200 mg Per Tube Weekly 06/19/17 1439     06/20/17 0500  vancomycin (VANCOCIN) 500 mg in sodium chloride 0.9 % 100 mL IVPB     500 mg 100 mL/hr over 60 Minutes Intravenous Every 12 hours 06/19/17 1340     06/19/17 2000  piperacillin-tazobactam (ZOSYN) IVPB 3.375 g     3.375 g 12.5 mL/hr over 240 Minutes Intravenous Every 8 hours 06/19/17 1340     06/19/17 2000  amphotericin B liposome (AMBISOME) 180 mg in dextrose 5 % 500 mL IVPB     4 mg/kg  44.2 kg 250 mL/hr over 120 Minutes Intravenous Every 24 hours 06/19/17 1509     06/19/17 1400  vancomycin (VANCOCIN) IVPB 1000 mg/200 mL premix     1,000 mg 200 mL/hr over 60 Minutes Intravenous  Once 06/19/17 1340 06/19/17 1627   06/19/17 1400  piperacillin-tazobactam (ZOSYN) IVPB 3.375 g     3.375 g 100 mL/hr over 30 Minutes Intravenous  Once 06/19/17 1340 06/19/17 1556   06/18/17 2000  amphotericin B liposome (AMBISOME) 180 mg in dextrose 5 % 500 mL IVPB  Status:  Discontinued     4 mg/kg  44.2 kg 250 mL/hr over 120 Minutes Intravenous Every 24 hours 06/18/17 1834 06/19/17 1509   06/18/17 1915  amphotericin B liposome (AMBISOME) 180 mg in dextrose 5 % 500 mL IVPB  Status:  Discontinued     4 mg/kg  44.2 kg 250 mL/hr over 120 Minutes Intravenous Every 24 hours 06/18/17 1813 06/18/17 1834   06/18/17 1815  flucytosine (ANCOBON) capsule 1,000 mg     25 mg/kg  44.2 kg Oral Every 6 hours 06/18/17 1813        Medications: Scheduled Meds: . acetaminophen  650 mg Oral QID  . [START ON 06/20/2017] amLODipine  5 mg Per Tube Daily  . [START ON 06/20/2017]  azithromycin  1,200 mg Per Tube Weekly  . chlorhexidine gluconate (MEDLINE KIT)  15 mL Mouth Rinse BID  . dextrose  10 mL Intravenous Q24H  . dextrose  10 mL Intravenous Q24H  . flucytosine  25 mg/kg Oral Q6H  . mouth rinse  15 mL Mouth Rinse QID  . pantoprazole (PROTONIX) IV  40 mg Intravenous Daily  . [START ON 06/20/2017] sulfamethoxazole-trimethoprim  1 tablet Per Tube Daily   Continuous Infusions: . sodium chloride 75 mL/hr at 06/19/17 1600  . amphotericin  B  Liposome (AMBISOME) ADULT IV    . fentaNYL infusion INTRAVENOUS 300 mcg/hr (06/19/17 1602)  . piperacillin-tazobactam (ZOSYN)  IV    . propofol (DIPRIVAN) infusion 29.885 mcg/kg/min (06/19/17 1600)  . sodium chloride    . sodium chloride    . [START ON 06/20/2017] vancomycin     PRN Meds:.fentaNYL, fentaNYL (SUBLIMAZE) injection, fentaNYL (SUBLIMAZE) injection, hydrALAZINE, midazolam, midazolam    Objective: Weight change:   Intake/Output Summary (Last 24 hours) at 06/19/17 1633 Last data filed at 06/19/17 1600  Gross per 24 hour  Intake           1791.9 ml  Output  1100 ml  Net            691.9 ml   Blood pressure (!) 149/110, pulse (!) 109, temperature 97.6 F (36.4 C), temperature source Oral, resp. rate 20, height '5\' 6"'$  (1.676 m), weight 97 lb 7.1 oz (44.2 kg), SpO2 100 %. Temp:  [97.6 F (36.4 C)-98.4 F (36.9 C)] 97.6 F (36.4 C) (08/03 1601) Pulse Rate:  [83-127] 109 (08/03 1600) Resp:  [15-20] 20 (08/03 1600) BP: (63-159)/(51-122) 149/110 (08/03 1600) SpO2:  [94 %-100 %] 100 % (08/03 1600) FiO2 (%):  [100 %] 100 % (08/03 1600) Weight:  [95 lb 14.4 oz (43.5 kg)-97 lb 7.1 oz (44.2 kg)] 97 lb 7.1 oz (44.2 kg) (08/03 1218)  Physical Exam: General: Alert and awake fatigued HEENT: anicteric sclera,  EOMI CVS tachy rate rate, normal r,  no murmur rubs or gallops Chest: clear to auscultation bilaterally, no wheezing, rales or rhonchi Abdomen: soft nontender, nondistended, normal bowel  sounds, Extremities: no  clubbing or edema noted bilaterally Neuro: nonfocal  CBC:  CBC Latest Ref Rng & Units 06/19/2017 06/18/2017 06/17/2017  WBC 4.0 - 10.5 K/uL 4.3 4.0 3.3(L)  Hemoglobin 13.0 - 17.0 g/dL 12.3(L) 12.2(L) 11.6(L)  Hematocrit 39.0 - 52.0 % 37.1(L) 36.8(L) 35.8(L)  Platelets 150 - 400 K/uL 108(L) 156 140(L)     BMET  Recent Labs  06/18/17 0936 06/19/17 0433  NA 129* 122*  K 4.5 3.6  CL 92* 86*  CO2 28 29  GLUCOSE 112* 109*  BUN 11 8  CREATININE 0.46* 0.47*  CALCIUM 9.0 8.7*     Liver Panel   Recent Labs  06/16/17 1915  PROT 9.7*  ALBUMIN 3.1*  AST 19  ALT 13*  ALKPHOS 58  BILITOT 0.8  BILIDIR 0.2  IBILI 0.6       Sedimentation Rate No results for input(s): ESRSEDRATE in the last 72 hours. C-Reactive Protein No results for input(s): CRP in the last 72 hours.  Micro Results: Recent Results (from the past 720 hour(s))  Acid Fast Smear (AFB)     Status: None   Collection Time: 06/17/17 11:29 PM  Result Value Ref Range Status   AFB Specimen Processing Concentration  Final   Acid Fast Smear Negative  Final    Comment: (NOTE) Performed At: [facility] [address] [place], [place] [id number] Lindon Romp MD [id number]    Source (AFB) SPUTUM  Final  Culture, expectorated sputum-assessment     Status: None   Collection Time: 06/18/17  5:00 AM  Result Value Ref Range Status   Specimen Description SPUTUM  Final   Special Requests Immunocompromised  Final   Sputum evaluation   Final    Sputum specimen not acceptable for testing.  Please recollect.   Gram Stain Report Called to,Read Back By and Verified With: RN Anna Genre [phone] [id number] MLM    Report Status 06/18/2017 FINAL  Final  Acid Fast Smear (AFB)     Status: None   Collection Time: 06/18/17  5:00 AM  Result Value Ref Range Status   AFB Specimen Processing Concentration  Final   Acid Fast Smear Negative  Final    Comment: (NOTE) Performed At: [facility] [place], [place] [id number] Lindon Romp MD [id number]    Source (AFB) SPUTUM  Final  Culture, blood (Routine X 2) w Reflex to ID Panel     Status: None (Preliminary result)   Collection Time: 06/18/17  9:35 AM  Result  Value Ref Range Status   Specimen Description BLOOD LEFT HAND  Final   Special Requests   Final    BOTTLES DRAWN AEROBIC ONLY Blood Culture results may not be optimal due to an inadequate volume of blood received in culture bottles   Culture NO GROWTH 1 DAY  Final   Report Status PENDING  Incomplete  Culture, blood (Routine X 2) w Reflex to ID Panel     Status: None (Preliminary result)   Collection Time: 06/18/17  9:36 AM  Result Value Ref Range Status   Specimen Description BLOOD LEFT ARM  Final   Special Requests   Final    BOTTLES DRAWN AEROBIC ONLY Blood Culture results may not be optimal due to an inadequate volume of blood received in culture bottles   Culture NO GROWTH 1 DAY  Final   Report Status PENDING  Incomplete  MRSA PCR Screening     Status: None   Collection Time: 06/19/17 12:18 PM  Result Value Ref Range Status   MRSA by PCR NEGATIVE NEGATIVE Final    Comment:        The GeneXpert MRSA Assay (FDA approved for NASAL specimens only), is one component of a comprehensive MRSA colonization surveillance program. It is not intended to diagnose MRSA infection nor to guide or monitor treatment for MRSA infections.   CSF culture with Stat gram stain     Status: None (Preliminary result)   Collection Time: 06/19/17  1:30 PM  Result Value Ref Range Status   Specimen Description CSF  Final   Special Requests NONE  Final   Gram Stain   Final    CYTOSPIN SMEAR WBC PRESENT, PREDOMINANTLY MONONUCLEAR YEAST CRITICAL RESULT CALLED TO, READ BACK BY AND VERIFIED WITH: Kenneth Foot RN AT 0345 ON [id number] BY SJW    Culture PENDING  Incomplete   Report Status PENDING  Incomplete    Studies/Results: Ct Head Wo Contrast  Result  Date: 06/18/2017 CLINICAL DATA:  Altered mental status.  HIV EXAM: CT HEAD WITHOUT CONTRAST TECHNIQUE: Contiguous axial images were obtained from the base of the skull through the vertex without intravenous contrast. COMPARISON:  02/05/2013 FINDINGS: Brain: Progression of atrophy since the prior study which may be due dates IV infection. Negative for hydrocephalus. Interval development of diffuse periventricular white matter hypodensity. More focal hypodensity in the left inferior frontal white matter measuring approximately 11 mm. White matter appeared normal on the prior study. No significant associated mass-effect. Negative for hemorrhage. Vascular: Negative for hyperdense vessel Skull: Negative Sinuses/Orbits: Minimal mucosal edema left maxillary sinus. Remaining sinuses clear. Normal orbit. Other: None IMPRESSION: Progression of generalized atrophy which may be due to HIV infection. Interval development of diffuse white matter disease in the periventricular white matter with focal hypodensity left inferior frontal lobe. This could be due to chronic ischemia however in the setting of HIV also consider infection and less likely tumor. Follow-up MRI of the brain with contrast suggested. Electronically Signed   By: Franchot Gallo M.D.   On: 06/18/2017 13:40   Dg Fluoro Rm 1-60 Min  Result Date: 06/18/2017 Gilford Silvius, MD     06/18/2017  2:04 PM Radiology Procedure Note Aborted LP.  When prone on the table patient became unresponsive with insufficient respirations.  He was quickly moved back to the stretcher, supine with elevated HOB.  Radial pulse was initially weak but normalized by the time a monitor was attached.  His mentation gradually returned to prior baseline.  Vitals recorded  elsewhere, he was hypertensive in NSR with normal O2.  This was presumed positional hypoxia.  FM service notified by phone.  JWatts MD  Dg Chest Port 1 View  Result Date: 06/19/2017 CLINICAL DATA:  Lung abscess EXAM: PORTABLE  CHEST 1 VIEW COMPARISON:  06/16/2017 FINDINGS: Endotracheal tube placed with its tip 4.6 cm from the carina. The heart is normal in size. Lungs are clear. No pneumothorax or pleural effusion. Lungs remain hyperaerated. IMPRESSION: Endotracheal tube in place.  Clear lungs. Electronically Signed   By: Marybelle Killings M.D.   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 male with  HIV AIDS poor compliance with his regimen now down to have the cocci media and CRYPTOmeningitis with high degree of pressures also cavitary pneumonia with purulent material found on bronchoscopy.  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  I STRONGLY RECOMMEND NEUROSURGERY PLACING A DRAIN AND LATER PLACING A SHUNT  SERIAL LPS TO MANAGE HIS ICP WILL BE HIGHLY PROBLEMATIC GIVEN HIS RESPIRATORY FAILURE AND DIFFICULT ANATOMY AND DRAIN IS A GOOD TEMPORIZING OPTION (imo SHOULD BE FOLLOWED BY A SHUNT)  SIMILAR TO 2 OTHER PATIENTS WE HAVE HAD ON SERVICE RECENTLY HIS ICP WILL ALSO BE HIGHLY PROBLEMATIC WHEN HE GETS PLACED ON ARV IN NEXT 5 WEEKS OR SO  In meantime continue Ambisome and flucytosine  Keep on ventilator until we know we have good longer term ability to manage his ICPs with a drain--> shunt  #2 RML cavitary PNA: Defined seen found a great deal of purulence in this lobe which he feels consistent with a bacterial infection and I agree with covering with antibacterial antibiotics. Specimens of also been sent for AFB  fungal cultures, PCP and bacterial cultures I would also make sure the AFB is sent for nocardia actinomyces  If his AFB smears from the bronchoscopy are negative he can be taken out of airborne isolation.  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 Interestingly his viral load is not in itself especially high. Nonetheless I would worry a great deal about precipitating iris we initiated antiretrovirals until we will until waiting the full 5+ weeks  I spent greater than 35 minutes with the patient including greater than 50% of time in face to face counsel of the patient and in coordination of their care with croup care and the primary team.  I also updated Kenneth Rivas the patients long time roommate who I know well. NOK is a sister who per [name] DOES know of pts HIV + status  Dr. Baxter Flattery to check on the patient tomorrow      LOS: 2 days   Alcide Evener 06/19/2017, 4:33 PM

## 2017-06-19 NOTE — Consult Note (Addendum)
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 Admitted on 7/31 w/ cc: weakness and FTT.  Admitting dx: FTT and cachexia d/t AIDs, pulmonary nodules, bedbugs, and HTn. Had CT chest showing cavitary lung lesion in the RML. ID was consulted and ordered serum crypto, this was positive. The patient was complaining of headache. An attempt to have LP on 8/2 was unsuccessful due to what was felt to be positional hypoxia during prone positioning. PCCM was asked to see him on 8/3 for formal consult w/ request to obtain LP as well as bronchoscopy/ BAL for AFB. As active TB and or cryptococcal infection would be contra-indication to resuming ARVs acutely   PAST MEDICAL HISTORY :  He  has a past medical history of AIDS ([place]); Anemia; Anxiety; Hepatitis B; HIV disease ([place]); Hypertension; and Immune deficiency disorder ([place]).  PAST SURGICAL HISTORY: He  has a past surgical history that includes Hernia repair.  No Known Allergies  No current facility-administered medications on file prior to encounter.    Current Outpatient Prescriptions on File Prior to Encounter  Medication Sig  . amLODipine (NORVASC) 5 MG tablet Take 1 tablet (5 mg total) by mouth daily. (Patient not taking: Reported on 06/11/2017)  . dolutegravir (TIVICAY) 50 MG tablet Take 1 tablet (50 mg total) by mouth daily. (Patient not taking: Reported on 06/11/2017)  . emtricitabine-tenofovir (TRUVADA) 200-300 MG per tablet Take 1 tablet by mouth daily. (Patient not taking: Reported on 06/11/2017)  . HYDROcodone-acetaminophen (NORCO/VICODIN) 5-325 MG per tablet Take 1-2  tablets by mouth every 4 (four) hours as needed for pain. (Patient not taking: Reported on 06/11/2017)  . megestrol (MEGACE ES) 625 MG/5ML suspension Take 5 mLs (625 mg total) by mouth daily. (Patient not taking: Reported on 06/11/2017)  . naproxen (NAPROSYN) 375 MG tablet Take 1 tablet (375 mg total) by mouth 2 (two) times daily. (Patient not taking: Reported on 06/11/2017)  . ondansetron (ZOFRAN) 4 MG tablet Take 1 tablet (4 mg total) by mouth every 8 (eight) hours as needed for nausea. (Patient not taking: Reported on 06/11/2017)  . potassium chloride SA (K-DUR,KLOR-CON) 20 MEQ tablet Take 1 tablet (20 mEq total) by mouth 2 (two) times daily. (Patient not taking: Reported on 06/17/2017)  . triamcinolone ointment (KENALOG) 0.5 % Apply topically 2 (two) times daily. (Patient not taking: Reported on 06/11/2017)    FAMILY HISTORY:  His indicated that his mother is alive. He indicated that his father is deceased. He indicated that his maternal grandmother is deceased. He indicated that his maternal grandfather is deceased. He indicated that his paternal grandmother is deceased. He indicated that his paternal grandfather is deceased.    SOCIAL HISTORY:  He  reports that he has been smoking Cigarettes.  He has been smoking about 0.30 packs per day. He has never used smokeless tobacco. He reports that he drinks about 2.0 oz of alcohol per week . He reports that he uses drugs, including Cocaine.  REVIEW OF SYSTEMS:   Unable   Subjective Now sedated on vent   VITAL SIGNS: BP (!) 158/122 (BP Location: Left Arm)  Pulse (!) 127   Temp 98.4 F (36.9 C) (Oral)   Resp 16   Ht _0  (1.676 m)   Wt 97 lb 7.1 oz (44.2 kg)   SpO2 94%   BMI 15.73 kg/m   HEMODYNAMICS:    VENTILATOR SETTINGS:    INTAKE / OUTPUT: I/O last 3 completed shifts: In: 1080 [P.O.:580; IV Piggyback:500] Out: 1601 [Urine:1600; Stool:1]  PHYSICAL EXAMINATION: General: letahrgic Neuro: lethargic, does answer questions, O x  1, moves ext , weak HEENT: jvd down Cardiovascular:  s1 s2 RRR distant Lungs:  CTA, slight scattred ronmchi rt antior Abdomen: soft, BS wnl, no r Musculoskeletal:  No muscle mass Skin: diffuse small annular dark eareas on ext  LABS:  BMET  Recent Labs Lab 06/18/17 0029 06/18/17 0936 06/19/17 0433  NA 131* 129* 122*  K 3.3* 4.5 3.6  CL 93* 92* 86*  CO2 32 28 29  BUN _1 CREATININE 0.57* 0.46* 0.47*  GLUCOSE 178* 112* 109*    Electrolytes  Recent Labs Lab 06/18/17 0029 06/18/17 0936 06/19/17 0433  CALCIUM 8.8* 9.0 8.7*  MG 1.9 1.7 1.7  PHOS 1.5* 3.1 3.1    CBC  Recent Labs Lab 06/17/17 0416 06/18/17 0029 06/19/17 0433  WBC 3.3* 4.0 4.3  HGB 11.6* 12.2* 12.3*  HCT 35.8* 36.8* 37.1*  PLT 140* 156 108*    Coag's No results for input(s): APTT, INR in the last 168 hours.  Sepsis Markers  Recent Labs Lab 06/16/17 1409 06/16/17 1924  LATICACIDVEN 2.90* 2.03*    ABG No results for input(s): PHART, PCO2ART, PO2ART in the last 168 hours.  Liver Enzymes  Recent Labs Lab 06/16/17 1915  AST 19  ALT 13*  ALKPHOS 58  BILITOT 0.8  ALBUMIN 3.1*    Cardiac Enzymes No results for input(s): TROPONINI, PROBNP in the last 168 hours.  Glucose  Recent Labs Lab 06/19/17 1227  GLUCAP 111*    Imaging Ct Head Wo Contrast  Result Date: 06/18/2017 CLINICAL DATA:  Altered mental status.  HIV EXAM: CT HEAD WITHOUT CONTRAST TECHNIQUE: Contiguous axial images were obtained from the base of the skull through the vertex without intravenous contrast. COMPARISON:  02/05/2013 FINDINGS: Brain: Progression of atrophy since the prior study which may be due dates IV infection. Negative for hydrocephalus. Interval development of diffuse periventricular white matter hypodensity. More focal hypodensity in the left inferior frontal white matter measuring approximately 11 mm. White matter appeared normal on the prior study. No significant associated mass-effect. Negative  for hemorrhage. Vascular: Negative for hyperdense vessel Skull: Negative Sinuses/Orbits: Minimal mucosal edema left maxillary sinus. Remaining sinuses clear. Normal orbit. Other: None IMPRESSION: Progression of generalized atrophy which may be due to HIV infection. Interval development of diffuse white matter disease in the periventricular white matter with focal hypodensity left inferior frontal lobe. This could be due to chronic ischemia however in the setting of HIV also consider infection and less likely tumor. Follow-up MRI of the brain with contrast suggested. Electronically Signed   By: Franchot Gallo M.D.   On: 06/18/2017 13:40   Dg Fluoro Rm 1-60 Min  Result Date: 06/18/2017 Gilford Silvius, MD     06/18/2017  2:04 PM Radiology Procedure Note Aborted LP.  When prone on the table patient became unresponsive with insufficient respirations.  He was quickly moved back to the stretcher, supine with elevated HOB.  Radial pulse was initially weak but normalized by the time a monitor was attached.  His mentation gradually  returned to prior baseline.  Vitals recorded elsewhere, he was hypertensive in NSR with normal O2.  This was presumed positional hypoxia.  FM service notified by phone.  JWatts MD    STUDIES:  CT chest 8/1: 7 mm cavitary lesion is seen in the right middle lobe. It is uncertain if this represents cavitary metastatic lesion or small pulmonary abscess. Clinical  CT head 8/2: Progression of generalized atrophy which may be due to HIV infection.Interval development of diffuse white matter disease in the periventricular white matter with focal hypodensity left inferior frontal lobe. This could be due to chronic ischemia however in the setting of HIV also consider infection and less likely tumor. 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 Also w/ cavitary RML lung nodule rasing concern for either pulmonary cryptococcal involvement or some other opportunistic infection.  Will intubate Get BAL Do LP and obtain opening pressures. If they are elevated he will probably need Lumbar drain.  Additional recs per ID Ventilation plan will depend on his LP opening pressure results   ASSESSMENT / PLAN:  PULMONARY A: Intubated for airway protection RML cavitary lung nodule P:   Full vent support  PAD protocol F/u BAL results Air-born precautions   CARDIOVASCULAR A:  HTN  P:  Tele KVO IVFs Cont antihypertensives Vt   RENAL A:   Progressive hyponatremia  P:   F/u TSH f/u  F/u cortisol F/u urine Na send urine osmo KVO IVFs Place foley  GASTROINTESTINAL A:   Severe protein calorie malnutrition w/ failure to thrive  P:   NPO Assess 8/4 for tubefeeds.   HEMATOLOGIC A:   Anemia of chronic disease.  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  R/o SIADH P: See renal sxn   FAMILY  - Updates:   - Inter-disciplinary family meet or  Palliative Care meeting due by: 1 week    Pulmonary and [place] Pager: [phone]  06/19/2017, 12:34 PM  STAFF NOTE: I, Merrie Roof, MD FACP have personally reviewed patient's available data, including medical history, events of note, physical examination and test results as part of my evaluation. I have discussed with resident/NP and other care providers such as pharmacist, RN and RRT. In addition, I personally evaluated patient and elicited key findings of: lethargic, o x 1, slight scattered ronchi rt anterior, clear otherwise, stiff neck, low muscle mass, small lesions chronic on skin, pcxr I reviewed with large lung volumes, CT I reviewed with small cavitation RML, he has  AIDS and crypto serum pos and likely crypto meningitis, he requires ETT and mech avent for LP which showed opening pressure almost 50 and closed at 17, sending cryto etc on csf, have called NS for opinion for lumbar drain, for 8 cc/kg, rate 20, abg to follow, pcxr to follow, bronch done suprisingly showed pus obstruction significant RML - likely c/w abscess and not tb, he will remain on reverse isolation and rule out TB, it is likley if drain not placed will need repeat LP, adding ABX vanc zosyn folow BAl , continued flucyt and ampho B per ID, start feeds he appears to have severe protein malnutrition, add dvt prev and gastric protection, may need repeat imagnig brain per NS The patient is critically ill with multiple organ systems failure and requires high complexity decision making for assessment and support, frequent evaluation and titration of therapies, application of advanced monitoring technologies and extensive interpretation of multiple databases.   Critical Care Time devoted to patient care services described in this note is45 Minutes. This time reflects time of care of this signee: Merrie Roof, MD FACP. This critical care time does not reflect procedure time, or teaching  time or supervisory time of PA/NP/Med student/Med Resident etc but could involve care discussion time. Rest per NP/medical resident whose note is outlined above and that I agree with   [name] Titus Mould, MD, [place] Pgr: [facility] 06/19/2017 1:35 PM   Update: Discussions had with NS and IR. Neurostatus was good pre ETT, with Orientation x 1-2 and answering questions, techinal feasible issues with flouro now, re assess drain options in am, will need repeat LP in am

## 2017-06-19 NOTE — Progress Notes (Signed)
[facility] for Infectious Disease  Date of Admission:  06/16/2017   Total days of antibiotics 2        Day 2 amphotericin (started 8/2)        Day 2 flucytosine (started 8/2)        Day 1 Zosyn (started 8/3)        Day 1 Vancomycin (started 8/3)        Day 1 Bactrim (to be started 8/4) ASSESSMENT and PLAN:  Cryptococcal Meningitis: Cryptococcal antigen positive (8/2) with a high titer of > 1:2560 -Lumbar puncture done today 06/19/17. He has a high opening pressure=47cm h20.  66m of spinal fluid was removed and his closing pressure was 17cm h20. -Bronchoscopy today 06/19/17 showed entire bronchus obstruction with thick yellow pus. The obstructed rll and rml were cleared. Pending BAL culture. -CSF positive for cryptococcus.  CSF findings of low glucose and high protein are consistent with csf findings for cryptococcus Pending-  CSF analysis for cell count and diff, CSF Culture, and CSF JC virus -Pending fungus culture with stain  -Continue amphotericin and flucytosine started 06/18/17              Patient's creatinine is 0.47 (8/3) -AFB culture and smear -Pending pneumocystis smear  Cachexia associated with AIDS Patient's weakness and cachexia are likely due to his AIDS and poor po intake -Patient on IVF -Patient's CD4 count (06/11/17) is untetectable, last viral load (2016)=14034 -Will not start anti-retrovirals now due to possible immune reconstitution syndrome  . acetaminophen  650 mg Oral QID  . [START ON 06/20/2017] amLODipine  5 mg Per Tube Daily  . [START ON 06/20/2017] azithromycin  1,200 mg Per Tube Weekly  . chlorhexidine gluconate (MEDLINE KIT)  15 mL Mouth Rinse BID  . dextrose  10 mL Intravenous Q24H  . dextrose  10 mL Intravenous Q24H  . flucytosine  25 mg/kg Oral Q6H  . mouth rinse  15 mL Mouth Rinse QID  . pantoprazole (PROTONIX) IV  40 mg Intravenous Daily  . [START ON 06/20/2017] sulfamethoxazole-trimethoprim  1 tablet Per Tube Daily     SUBJECTIVE: Mr. DPickerillwas seen laying in his bed. He was very somnolent and not very communicative. He stated that he does not have any sob, headache, or chest pain.   Review of Systems: ROS none other than above   No Known Allergies  OBJECTIVE: Vitals:   06/19/17 1218 06/19/17 1322 06/19/17 1515 06/19/17 1601  BP:  (!) 150/110 (!) 136/100   Pulse:  (!) 120 94   Resp:      Temp:    97.6 F (36.4 C)  TempSrc:    Oral  SpO2:  100% 100%   Weight: 97 lb 7.1 oz (44.2 kg)     Height: _0  (1.676 m)      Body mass index is 15.73 kg/m.  Physical Exam  Constitutional: He appears unhealthy. He appears cachectic. He has a sickly appearance.  HENT:  Head: Normocephalic and atraumatic.  Cardiovascular: Normal rate, regular rhythm and normal heart sounds.   Pulmonary/Chest: Effort normal and breath sounds normal. No respiratory distress. He has no wheezes.  Musculoskeletal:       Right shoulder: He exhibits no effusion.  Neurological:  Patient was somnolent on exam  Skin: Rash noted.    Lab Results Lab Results  Component Value Date   WBC 4.3 06/19/2017   HGB 12.3 (L) 06/19/2017   HCT 37.1 (L)  06/19/2017   MCV 80.7 06/19/2017   PLT 108 (L) 06/19/2017    Lab Results  Component Value Date   CREATININE 0.47 (L) 06/19/2017   BUN 8 06/19/2017   NA 122 (L) 06/19/2017   K 3.6 06/19/2017   CL 86 (L) 06/19/2017   CO2 29 06/19/2017    Lab Results  Component Value Date   ALT 13 (L) 06/16/2017   AST 19 06/16/2017   ALKPHOS 58 06/16/2017   BILITOT 0.8 06/16/2017     Microbiology: Recent Results (from the past 240 hour(s))  Acid Fast Smear (AFB)     Status: None   Collection Time: 06/17/17 11:29 PM  Result Value Ref Range Status   AFB Specimen Processing Concentration  Final   Acid Fast Smear Negative  Final    Comment: (NOTE) Performed At: [facility] [address] [place], [place] [id number] Lindon Romp MD [id number]    Source (AFB) SPUTUM   Final  Culture, expectorated sputum-assessment     Status: None   Collection Time: 06/18/17  5:00 AM  Result Value Ref Range Status   Specimen Description SPUTUM  Final   Special Requests Immunocompromised  Final   Sputum evaluation   Final    Sputum specimen not acceptable for testing.  Please recollect.   Gram Stain Report Called to,Read Back By and Verified With: RN Anna Genre [phone] MLM    Report Status 06/18/2017 FINAL  Final  Acid Fast Smear (AFB)     Status: None   Collection Time: 06/18/17  5:00 AM  Result Value Ref Range Status   AFB Specimen Processing Concentration  Final   Acid Fast Smear Negative  Final    Comment: (NOTE) Performed At: [facility] [address] [place], [place] [id number] Lindon Romp MD [id number]    Source (AFB) SPUTUM  Final  Culture, blood (Routine X 2) w Reflex to ID Panel     Status: None (Preliminary result)   Collection Time: 06/18/17  9:35 AM  Result Value Ref Range Status   Specimen Description BLOOD LEFT HAND  Final   Special Requests   Final    BOTTLES DRAWN AEROBIC ONLY Blood Culture results may not be optimal due to an inadequate volume of blood received in culture bottles   Culture NO GROWTH 1 DAY  Final   Report Status PENDING  Incomplete  Culture, blood (Routine X 2) w Reflex to ID Panel     Status: None (Preliminary result)   Collection Time: 06/18/17  9:36 AM  Result Value Ref Range Status   Specimen Description BLOOD LEFT ARM  Final   Special Requests   Final    BOTTLES DRAWN AEROBIC ONLY Blood Culture results may not be optimal due to an inadequate volume of blood received in culture bottles   Culture NO GROWTH 1 DAY  Final   Report Status PENDING  Incomplete  MRSA PCR Screening     Status: None   Collection Time: 06/19/17 12:18 PM  Result Value Ref Range Status   MRSA by PCR NEGATIVE NEGATIVE Final    Comment:        The GeneXpert MRSA Assay (FDA approved for NASAL specimens only), is one component of  a comprehensive MRSA colonization surveillance program. It is not intended to diagnose MRSA infection nor to guide or monitor treatment for MRSA infections.   CSF culture with Stat gram stain     Status: None (Preliminary result)   Collection Time:  06/19/17  1:30 PM  Result Value Ref Range Status   Specimen Description CSF  Final   Special Requests NONE  Final   Gram Stain   Final    CYTOSPIN SMEAR WBC PRESENT, PREDOMINANTLY MONONUCLEAR YEAST CRITICAL RESULT CALLED TO, READ BACK BY AND VERIFIED WITH: Jessie Foot RN AT 0345 ON [id number] BY SJW    Culture PENDING  Incomplete   Report Status PENDING  Incomplete    Lars Mage, [facility] [phone] pager   [phone] cell 06/19/2017, 4:09 PM

## 2017-06-19 NOTE — Plan of Care (Signed)
Problem: Nutrition: Goal: Adequate nutrition will be maintained Outcome: Not Progressing Eating less than 25% of diet supplements ordered no appetite

## 2017-06-20 ENCOUNTER — Inpatient Hospital Stay (HOSPITAL_COMMUNITY): Payer: Medicaid Other

## 2017-06-20 DIAGNOSIS — J9601 Acute respiratory failure with hypoxia: Secondary | ICD-10-CM

## 2017-06-20 LAB — CBC
HCT: 29.3 % — ABNORMAL LOW (ref 39.0–52.0)
Hemoglobin: 9.9 g/dL — ABNORMAL LOW (ref 13.0–17.0)
MCH: 27.2 pg (ref 26.0–34.0)
MCHC: 33.8 g/dL (ref 30.0–36.0)
MCV: 80.5 fL (ref 78.0–100.0)
PLATELETS: 71 10*3/uL — AB (ref 150–400)
RBC: 3.64 MIL/uL — AB (ref 4.22–5.81)
RDW: 13.8 % (ref 11.5–15.5)
WBC: 3.6 10*3/uL — AB (ref 4.0–10.5)

## 2017-06-20 LAB — ACID FAST SMEAR (AFB): ACID FAST SMEAR - AFSCU2: NEGATIVE

## 2017-06-20 LAB — BLOOD GAS, ARTERIAL
ACID-BASE EXCESS: 2.4 mmol/L — AB (ref 0.0–2.0)
BICARBONATE: 26.3 mmol/L (ref 20.0–28.0)
Drawn by: 404151
FIO2: 40
LHR: 12 {breaths}/min
O2 Saturation: 98.9 %
PEEP: 5 cmH2O
Patient temperature: 98.6
VT: 500 mL
pCO2 arterial: 39.9 mmHg (ref 32.0–48.0)
pH, Arterial: 7.434 (ref 7.350–7.450)
pO2, Arterial: 163 mmHg — ABNORMAL HIGH (ref 83.0–108.0)

## 2017-06-20 LAB — BASIC METABOLIC PANEL
ANION GAP: 4 — AB (ref 5–15)
BUN: 9 mg/dL (ref 6–20)
CO2: 26 mmol/L (ref 22–32)
Calcium: 7.7 mg/dL — ABNORMAL LOW (ref 8.9–10.3)
Chloride: 92 mmol/L — ABNORMAL LOW (ref 101–111)
Creatinine, Ser: 0.56 mg/dL — ABNORMAL LOW (ref 0.61–1.24)
Glucose, Bld: 118 mg/dL — ABNORMAL HIGH (ref 65–99)
POTASSIUM: 2.9 mmol/L — AB (ref 3.5–5.1)
SODIUM: 122 mmol/L — AB (ref 135–145)

## 2017-06-20 LAB — MAGNESIUM: MAGNESIUM: 1.4 mg/dL — AB (ref 1.7–2.4)

## 2017-06-20 LAB — OSMOLALITY, URINE: Osmolality, Ur: 365 mOsm/kg (ref 300–900)

## 2017-06-20 LAB — ACID FAST SMEAR (AFB, MYCOBACTERIA): Acid Fast Smear: NEGATIVE

## 2017-06-20 LAB — PHOSPHORUS: PHOSPHORUS: 3.3 mg/dL (ref 2.5–4.6)

## 2017-06-20 LAB — TSH: TSH: 1.14 u[IU]/mL (ref 0.350–4.500)

## 2017-06-20 MED ORDER — POTASSIUM CHLORIDE 20 MEQ/15ML (10%) PO SOLN: 40.0000 meq | ORAL | Status: DC

## 2017-06-20 MED ORDER — POTASSIUM CHLORIDE 20 MEQ/15ML (10%) PO SOLN
40.0000 meq | Freq: Once | ORAL | Status: AC
  Administered 2017-06-20: 40 meq
  Filled 2017-06-20: qty 30

## 2017-06-20 MED ORDER — MAGNESIUM SULFATE 4 GM/100ML IV SOLN
4.0000 g | Freq: Once | INTRAVENOUS | Status: AC
  Administered 2017-06-20: 4 g via INTRAVENOUS
  Filled 2017-06-20: qty 100

## 2017-06-20 MED ORDER — LIDOCAINE HCL (PF) 1 % IJ SOLN
INTRAMUSCULAR | Status: AC
  Administered 2017-06-20: 5 mL
  Filled 2017-06-20: qty 5

## 2017-06-20 MED ORDER — FLUCYTOSINE 250 MG PO CAPS
25.0000 mg/kg | ORAL_CAPSULE | Freq: Four times a day (QID) | ORAL | Status: DC
  Administered 2017-06-21 – 2017-06-22 (×6): 1000 mg
  Filled 2017-06-20 (×7): qty 4

## 2017-06-20 MED ORDER — ACETAMINOPHEN 325 MG PO TABS
650.0000 mg | ORAL_TABLET | Freq: Four times a day (QID) | ORAL | Status: DC
  Administered 2017-06-21: 650 mg
  Filled 2017-06-20: qty 2

## 2017-06-20 MED ORDER — POTASSIUM CHLORIDE 10 MEQ/100ML IV SOLN
10.0000 meq | INTRAVENOUS | Status: AC
  Administered 2017-06-20 (×6): 10 meq via INTRAVENOUS
  Filled 2017-06-20 (×6): qty 100

## 2017-06-20 NOTE — Procedures (Signed)
PCCM Procedure Note   Procedure: Lumbar puncture  Operators: Kreg ShropshireP Babcock, R Latoyia Tecson  Medications: 1% lidocaine   Details: Spinous processes were identified using ultrasound and palpation. 1% lidocaine infiltrated for local anesthesia. A spinal needle introduced initially was some difficulty reaching target . Patient was slightly repositioned, given more flexion. Spinal fluid was obtained on repeat pass. 9 mL clear spinal fluid obtained. No apparent complications.   Opening pressure: 38 cm  Closing pressure: 7 cm  Plans: Patient remained supine for 4-5 hours. Pain control as ordered. He will likely require a repeat lumbar puncture on 8/5 to decompress.  Levy Pupaobert Halah Whiteside, MD, PhD 06/20/2017, 1:18 PM [facility] [phone] or if no answer [phone]

## 2017-06-20 NOTE — Progress Notes (Signed)
PULMONARY / CRITICAL CARE MEDICINE   Name: Kenneth Rivas MRN: [id number] DOB: 1961-07-25    ADMISSION DATE:  06/16/2017 CONSULTATION DATE:  Kenneth Rivas  REFERRING MD:  Kenneth Rivas   CHIEF COMPLAINT:   Respiratory distress and altered mental status   HISTORY OF PRESENT ILLNESS:    This is a 56 year old male w/ significant history of HIV/AIDs (last CD4 un-detectable), hep B, cocaine abuse and non-compliance w/ his tivicay and truvada. Admitted on 7/31 w/ cc: weakness and FTT.  Admitting dx: FTT and cachexia d/t AIDs, pulmonary nodules, bedbugs, and HTn. Had CT chest showing cavitary lung lesion in the RML. ID was consulted and ordered serum crypto, this was positive. The patient was complaining of headache. An attempt to have LP on 8/2 was unsuccessful due to what was felt to be positional hypoxia during prone positioning. PCCM was asked to see him on 8/3 for formal consult w/ request to obtain LP as well as bronchoscopy/ BAL for AFB. As active TB and or cryptococcal infection would be contra-indication to resuming ARVs acutely   Subjective Awake. No distress.   VITAL SIGNS: BP (!) 141/93   Pulse 75   Temp (!) 97.3 F (36.3 C) (Axillary)   Resp 16   Ht _0  (1.676 m)   Wt 97 lb 7.1 oz (44.2 kg)   SpO2 100%   BMI 15.73 kg/m   HEMODYNAMICS:    VENTILATOR SETTINGS: Vent Mode: CPAP;PSV FiO2 (%):  [40 %-100 %] 40 % Set Rate:  [12 bmp-20 bmp] 12 bmp Vt Set:  [500 mL-520 mL] 500 mL PEEP:  [5 cmH20] 5 cmH20 Pressure Support:  [10 cmH20] 10 cmH20 Plateau Pressure:  [11 cmH20-18 cmH20] 14 cmH20  INTAKE / OUTPUT: I/O last 3 completed shifts: In: 7026 [P.O.:120; I.V.:1395; IV Piggyback:3100] Out: 1970 [Urine:1970]  General appearance:  56 Year old  Male cachectic  NAD, currently in acute distress Eyes: anicteric sclerae, moist conjunctivae; PERRL, EOMI bilaterally. Mouth:  membranes and no mucosal ulcerations; orally intubated Neck: Trachea midline; neck supple, no JVD Lungs/chest: CTA, with  normal respiratory effort and no intercostal retractions CV: RRR, no MRGs  Abdomen: Soft, non-tender; no masses or HSM Extremities: No peripheral edema or extremity lymphadenopathy Skin: Normal temperature, turgor and texture; subcutaneous nodules Psych: Appropriate affect, alert and oriented to person, place and time  LABS:  BMET  Recent Labs Lab 06/18/17 0936 06/19/17 0433 06/20/17 0249  NA 129* 122* 122*  K 4.5 3.6 2.9*  CL 92* 86* 92*  CO2 _1 BUN _2 CREATININE 0.46* 0.47* 0.56*  GLUCOSE 112* 109* 118*    Electrolytes  Recent Labs Lab 06/18/17 0936 06/19/17 0433 06/20/17 0249  CALCIUM 9.0 8.7* 7.7*  MG 1.7 1.7 1.4*  PHOS 3.1 3.1 3.3    CBC  Recent Labs Lab 06/18/17 0029 06/19/17 0433 06/20/17 0249  WBC 4.0 4.3 3.6*  HGB 12.2* 12.3* 9.9*  HCT 36.8* 37.1* 29.3*  PLT 156 108* 71*    Coag's No results for input(s): APTT, INR in the last 168 hours.  Sepsis Markers  Recent Labs Lab 06/16/17 1409 06/16/17 1924  LATICACIDVEN 2.90* 2.03*    ABG  Recent Labs Lab 06/19/17 2052 06/20/17 0515  PHART 7.549* 7.434  PCO2ART 37.7 39.9  PO2ART 503.0* 163*    Liver Enzymes  Recent Labs Lab 06/16/17 1915  AST 19  ALT 13*  ALKPHOS 58  BILITOT 0.8  ALBUMIN 3.1*    Cardiac Enzymes No results for  input(s): TROPONINI, PROBNP in the last 168 hours.  Glucose  Recent Labs Lab 06/19/17 1227  GLUCAP 111*    Imaging Dg Chest Port 1 View  Result Date: 06/19/2017 CLINICAL DATA:  Lung abscess EXAM: PORTABLE CHEST 1 VIEW COMPARISON:  06/16/2017 FINDINGS: Endotracheal tube placed with its tip 4.6 cm from the carina. The heart is normal in size. Lungs are clear. No pneumothorax or pleural effusion. Lungs remain hyperaerated. IMPRESSION: Endotracheal tube in place.  Clear lungs. Electronically Signed   By: Kenneth Rivas M.D.   On: 06/19/2017 15:00     STUDIES:  CT chest 8/1: 7 mm cavitary lesion is seen in the right middle lobe. It  is uncertain if this represents cavitary metastatic lesion or small pulmonary abscess. Clinical  CT head 8/2: Progression of generalized atrophy which may be due to HIV infection.Interval development of diffuse white matter disease in the periventricular white matter with focal hypodensity left inferior frontal lobe. This could be due to chronic ischemia however in the setting of HIV also consider infection and less likely tumor. 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 Also w/ cavitary RML lung nodule rasing concern for either pulmonary cryptococcal involvement or some other opportunistic infection.  Cont full vent support over we F/u BAL and CSF labs Serial LPs  ASSESSMENT / PLAN:  PULMONARY A: Intubated for airway protection RML cavitary lung nodule P:   Cont air born precautions  Re-attempt SBT daily  PAD protocol See ID section   CARDIOVASCULAR A:  HTN  P:  Cont tele KVO IVFs antihypertensives   RENAL A:   Progressive hyponatremia -->stable;  hypomagnesemia and hypokalemia TSH nml Cort pending P:   F/u osmo KVO NS; allowing for NS bolus so that he can get his amphotericin  Cont I&O  GASTROINTESTINAL A:   Severe protein calorie malnutrition w/ failure to thrive  P:   Start tubefeeds PPI  HEMATOLOGIC A:   Anemia of chronic disease.  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 Really needs drain. This won't get it this weekend so will do serial LPs. Hope perhaps we can get a Psychologist, sport and exercise to help [place] soom.   NEUROLOGIC A:   Head ache  Cryptococcal meningitis  ->both IR and N-surg refusing to place drain  P:   RAS goal -1 Serial LPs daily to get closing pressure < 18  Endocrine A: Hyponatremia.  R/o SIADH P: See renal sxn  FAMILY  - Updates:   - Inter-disciplinary family meet  or Palliative Care meeting due by: 1 week  My cct 35 min  Erick Colace ACNP-BC [place] Pager # [phone] OR # [phone] if no answer  Attending Note:  I have examined patient, reviewed labs, studies and notes. I have discussed the case with Jerrye Bushy, and I agree with the data and plans as amended above. 56 year old man with HIV/AIDS, medical noncompliance, history of substance abuse. 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 BAL performed 8/3 for other organisms including AFB is still pending. Currently on amphotericin B, flucytosine, vancomycin, Zosyn. High opening pressure and yeast noted on lumbar puncture 8/3. On my evaluation he is cachectic, more sedated, does not breathe over the set rate on mechanical ventilator. Lungs are coarse bilaterally. Heart is regular without a murmur. Abdomen is benign. We will continue his current vent support, attempt lighten sedation and  move toward spontaneous breathing trials. He will need serial LPs for decompression of his elevated pressure. Optimally he would benefit from a lumbar drain. Continue current antimicrobials. Await BAL results, continue respiratory isolation until AFB has been ruled out. Independent critical care time is 34 minutes.   Baltazar Apo, MD, PhD 06/20/2017, 12:12 PM [place] [facility] [phone] or if no answer [phone]

## 2017-06-20 NOTE — Progress Notes (Signed)
eLink Physician-Brief Progress Note Patient Name: Kenneth Rivas DOB: 12-26-1960 MRN: [id number]   Date of Service  06/20/2017  HPI/Events of Note  Hypokalemia HypoMag  eICU Interventions  Potassium and Mag replaced     Intervention Category Intermediate Interventions: Electrolyte abnormality - evaluation and management  Kenneth Rivas 06/20/2017, 3:47 AM

## 2017-06-20 NOTE — Progress Notes (Addendum)
[place] for Infectious Disease    Date of Admission:  06/16/2017   Total days of antibiotics 3        Day 3 ampho/flucytosine        Day 2 vanco        Day 2 piptazo   ID: Kenneth Rivas is a 56 y.o. 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 He also underwent LP, with elevated OP of 47 -difficult to do LP due to DJD.  Addendum - had repeat LP this morning with Op of 38 that decreased to 7 after removal of 9.  Medications:  . acetaminophen  650 mg Oral QID  . amLODipine  5 mg Per Tube Daily  . azithromycin  1,200 mg Per Tube Weekly  . chlorhexidine gluconate (MEDLINE KIT)  15 mL Mouth Rinse BID  . dextrose  10 mL Intravenous Q24H  . dextrose  10 mL Intravenous Q24H  . flucytosine  25 mg/kg Oral Q6H  . mouth rinse  15 mL Mouth Rinse QID  . pantoprazole (PROTONIX) IV  40 mg Intravenous Daily  . potassium chloride  40 mEq Per Tube Once  . sulfamethoxazole-trimethoprim  1 tablet Per Tube Daily    Objective: Vital signs in last 24 hours: Temp:  [97.3 F (36.3 C)-98.4 F (36.9 C)] 97.3 F (36.3 C) (08/04 0400) Pulse Rate:  [73-122] 75 (08/04 1000) Resp:  [12-20] 16 (08/04 1000) BP: (63-150)/(51-111) 141/93 (08/04 1000) SpO2:  [99 %-100 %] 100 % (08/04 1000) FiO2 (%):  [40 %-100 %] 40 % (08/04 0816) Weight:  [97 lb 7.1 oz (44.2 kg)] 97 lb 7.1 oz (44.2 kg) (08/03 1218) Physical Exam  Constitutional: He  is intubated/sedated . He appears chronically ill, wasted. No distress.  HENT: intubated, og shows serosanginous with debris in suction drain.pupils reactive Mouth/Throat: OETT in place  Cardiovascular: Normal rate, regular rhythm and normal heart sounds. Exam reveals no gallop and no friction rub.  No murmur heard.  Pulmonary/Chest: Effort normal and breath sounds normal. No respiratory distress. He has no wheezes.  Neurological: sedated Ext: wasted extremities Skin: dry with scattered hyperpigmented scars  Lab Results  Recent Labs  06/19/17 0433 06/20/17 0249  WBC 4.3 3.6*  HGB 12.3* 9.9*  HCT 37.1* 29.3*  NA 122* 122*  K 3.6 2.9*  CL 86* 92*  CO2 29 26  BUN 8 9  CREATININE 0.47* 0.56*    Microbiology: Csf cr ag is 1:2560+ Csf profile : WBC of 10, 90% L, glu 36, prot 68, + yeast on gram stain Studies/Results: Ct Head Wo Contrast  Result Date: 06/18/2017 CLINICAL DATA:  Altered mental status.  HIV EXAM: CT HEAD WITHOUT CONTRAST TECHNIQUE: Contiguous axial images were obtained from the base of the skull through the vertex without intravenous contrast. COMPARISON:  02/05/2013 FINDINGS: Brain: Progression of atrophy since the prior study which may be due dates IV infection. Negative for hydrocephalus. Interval development of diffuse periventricular  white matter hypodensity. More focal hypodensity in the left inferior frontal white matter measuring approximately 11 mm. White matter appeared normal on the prior study. No significant associated mass-effect. Negative for hemorrhage. Vascular: Negative for hyperdense vessel Skull: Negative Sinuses/Orbits: Minimal mucosal edema left maxillary sinus. Remaining sinuses clear. Normal orbit. Other: None IMPRESSION: Progression of generalized atrophy which may be due to HIV infection. Interval development of diffuse white matter disease in the periventricular white matter with focal hypodensity left inferior frontal lobe. This could be due to  chronic ischemia however in the setting of HIV also consider infection and less likely tumor. Follow-up MRI of the brain with contrast suggested. Electronically Signed   By: Kenneth Rivas M.D.   On: 06/18/2017 13:40   Dg Fluoro Rm 1-60 Min  Result Date: 06/18/2017 Kenneth Silvius, MD     06/18/2017  2:04 PM Radiology Procedure Note Aborted LP.  When prone on the table patient became unresponsive with insufficient respirations.  He was quickly moved back to the stretcher, supine with elevated HOB.  Radial pulse was initially weak but normalized by the time a monitor was attached.  His mentation gradually returned to prior baseline.  Vitals recorded elsewhere, he was hypertensive in NSR with normal O2.  This was presumed positional hypoxia.  FM service notified by phone.  JWatts MD  Dg Chest Port 1 View  Result Date: 06/19/2017 CLINICAL DATA:  Lung abscess EXAM: PORTABLE CHEST 1 VIEW COMPARISON:  06/16/2017 FINDINGS: Endotracheal tube placed with its tip 4.6 cm from the carina. The heart is normal in size. Lungs are clear. No pneumothorax or pleural effusion. Lungs remain hyperaerated. IMPRESSION: Endotracheal tube in place.  Clear lungs. Electronically Signed   By: Kenneth Rivas M.D.   On: 06/19/2017 15:00     Assessment/Plan:  Disseminated cryptococcal disease/meningitis with high opening pressure = for now will recommend daily LP but will reach out to NSGY on Monday to see if they can do EVD  Vs. LD to monitor pressure and CSF output while on treatment with ampho plus flucytosis. Will need 14 days of induction with current regimen  Hypokalemia = likely related to ampho +/- refeeding syndrome. Recommend scheduling doses for now and PRN dosing for goal of 4  Pneumonia = likely cryptococcal but also may bacterial infection. Continue on vanco/piptazo til BAL cultures return. He is currently on airborne precautions to rule out mTB. There are 2  pulmonary specimen pending for AFB  HIV/AIDS = will post  pone initiating ART for at least 5 wk to minimize risk of IRIS  severe protein-caloric malnutrtion = on tubefeeds, watching electrolytes for refeeding syndrome  Kenneth Flattery [facility] for Infectious Diseases Cell: [phone] Pager: [phone]  06/20/2017, 11:56 AM

## 2017-06-20 NOTE — Progress Notes (Signed)
Social Note Saw Kenneth Rivas this morning.  He says that he is not in any pain.  He is currently intubated and being worked up for cryptococcal meningitis.  Awaiting AFB results as well.  We will continue to follow this patient and we will be happy to resume care whenever he is transferred out of CCM.  We appreciate the excellent care provided by the CCM team.

## 2017-06-21 DIAGNOSIS — J189 Pneumonia, unspecified organism: Secondary | ICD-10-CM

## 2017-06-21 DIAGNOSIS — J984 Other disorders of lung: Secondary | ICD-10-CM

## 2017-06-21 LAB — QUANTIFERON IN TUBE
QFT TB AG MINUS NIL VALUE: 0 IU/mL
QUANTIFERON MITOGEN VALUE: 0.36 IU/mL
QUANTIFERON NIL VALUE: 0.07 [IU]/mL
QUANTIFERON TB AG VALUE: 0.07 IU/mL
QUANTIFERON TB GOLD: UNDETERMINED

## 2017-06-21 LAB — MAGNESIUM
MAGNESIUM: 2.1 mg/dL (ref 1.7–2.4)
MAGNESIUM: 1.9 mg/dL (ref 1.7–2.4)
MAGNESIUM: 1.7 mg/dL (ref 1.7–2.4)

## 2017-06-21 LAB — CULTURE, BAL-QUANTITATIVE

## 2017-06-21 LAB — BASIC METABOLIC PANEL
ANION GAP: 5 (ref 5–15)
BUN: 8 mg/dL (ref 6–20)
CHLORIDE: 97 mmol/L — AB (ref 101–111)
CO2: 23 mmol/L (ref 22–32)
Calcium: 7.8 mg/dL — ABNORMAL LOW (ref 8.9–10.3)
Creatinine, Ser: 0.72 mg/dL (ref 0.61–1.24)
Glucose, Bld: 102 mg/dL — ABNORMAL HIGH (ref 65–99)
POTASSIUM: 3.8 mmol/L (ref 3.5–5.1)
SODIUM: 125 mmol/L — AB (ref 135–145)

## 2017-06-21 LAB — CBC
HCT: 26.3 % — ABNORMAL LOW (ref 39.0–52.0)
Hemoglobin: 8.7 g/dL — ABNORMAL LOW (ref 13.0–17.0)
MCH: 26.3 pg (ref 26.0–34.0)
MCHC: 33.1 g/dL (ref 30.0–36.0)
MCV: 79.5 fL (ref 78.0–100.0)
PLATELETS: 92 10*3/uL — AB (ref 150–400)
RBC: 3.31 MIL/uL — AB (ref 4.22–5.81)
RDW: 13.8 % (ref 11.5–15.5)
WBC: 4.1 10*3/uL (ref 4.0–10.5)

## 2017-06-21 LAB — PHOSPHORUS
PHOSPHORUS: 2.8 mg/dL (ref 2.5–4.6)
PHOSPHORUS: 3.3 mg/dL (ref 2.5–4.6)
PHOSPHORUS: 3.4 mg/dL (ref 2.5–4.6)

## 2017-06-21 LAB — CORTISOL-AM, BLOOD: CORTISOL - AM: 17.7 ug/dL (ref 6.7–22.6)

## 2017-06-21 LAB — QUANTIFERON TB GOLD ASSAY (BLOOD)

## 2017-06-21 LAB — CULTURE, BAL-QUANTITATIVE W GRAM STAIN

## 2017-06-21 MED ORDER — VITAL HIGH PROTEIN PO LIQD: 1000.0000 mL | ORAL | Status: DC

## 2017-06-21 MED ORDER — LIDOCAINE HCL (PF) 1 % IJ SOLN
INTRAMUSCULAR | Status: AC
  Administered 2017-06-21: 15:00:00
  Filled 2017-06-21: qty 5

## 2017-06-21 MED ORDER — ACETAMINOPHEN 160 MG/5ML PO SOLN
650.0000 mg | Freq: Four times a day (QID) | ORAL | Status: DC
  Administered 2017-06-21 – 2017-06-27 (×22): 650 mg
  Filled 2017-06-21 (×27): qty 20.3

## 2017-06-21 MED ORDER — DEXTROSE 5 % IV SOLN
1.0000 g | Freq: Three times a day (TID) | INTRAVENOUS | Status: AC
  Administered 2017-06-21 – 2017-06-28 (×22): 1 g via INTRAVENOUS
  Filled 2017-06-21 (×22): qty 1

## 2017-06-21 MED ORDER — VITAL AF 1.2 CAL PO LIQD
1000.0000 mL | ORAL | Status: DC
  Administered 2017-06-21: 1000 mL

## 2017-06-21 MED ORDER — PRO-STAT SUGAR FREE PO LIQD
30.0000 mL | Freq: Two times a day (BID) | ORAL | Status: DC
  Filled 2017-06-21: qty 30

## 2017-06-21 NOTE — Procedures (Signed)
PCCM Procedure Note  Procedure: Lumbar Puncture  Operator: R Kionna Brier  Medications: 1% lidocaine  Details: spinous processes identified by palpation. 1% lidocaine administered at L4-L5. Patient placed in flexion. Spinal needle introduced without difficulty. Clear fluid obtained. Opening pressure was 14 cm H2O. 2.5cc collected and then pressure obtained again to confirm the original measurement >> again 14 cm H2O. No further CSF was taken. No apparent complications.   Opening pressure: 14 cm  Closing pressure: 14 cm  Plans: pt to remain supine 4-5 hours. Pain control as ordered.  Since the pressure is < 18, he may not need further drainage. Probably needs a repeat LP on 8/6 to insure that the pressure hasn't risen. If not then suspect no further indicated. Review this with ID to insure this plan is acceptable.   Kenneth Pupaobert Maleeya Peterkin, MD, PhD 06/21/2017, 4:41 PM  Pulmonary and Critical Care [phone] or if no answer [phone]

## 2017-06-21 NOTE — Progress Notes (Signed)
Pharmacy Antibiotic Note  Kenneth Rivas is a 56 y.o. male admitted on 06/16/2017 with AIDS and failure to thrive. Pt with acute cryptococcal bloodstream infection also with cavitary pulmonary nodule. Pt transferred to the ICU as he required intubation to be able to obtain LP. Narrowing from vancomycin + zosyn to ceftazidime. Bactrim/Azithro on board for prophylaxis given barely detectable CD4 count. Pt is at high risk of developing kidney injury given combination of multiple nephrotoxic agents.   Plan: Ceftazidime 1gm IV Q8H F/u renal fxn, C&S, clinical status and LOT Continue amphotericin, fluctyosine, bactrim, azithro   Height: _0  (167.6 cm) Weight: 112 lb 14 oz (51.2 kg) IBW/kg (Calculated) : 63.8  Temp (24hrs), Avg:97.5 F (36.4 C), Min:95.5 F (35.3 C), Max:98.7 F (37.1 C)   Recent Labs Lab 06/16/17 1409 06/16/17 1924 06/17/17 0416 06/18/17 0029 06/18/17 0936 06/19/17 0433 06/20/17 0249 06/21/17 0226  WBC  --   --  3.3* 4.0  --  4.3 3.6* 4.1  CREATININE  --   --  0.54* 0.57* 0.46* 0.47* 0.56* 0.72  LATICACIDVEN 2.90* 2.03*  --   --   --   --   --   --      Antimicrobials this admission: 8/4 Azithromycin > 8/4 Bactrim > 8/3 vancomycin >8/5 8/3 zosyn >8/5 8/2 amphotericin B > 8/2 flucytosine > 8/5 Ceftaz >>  Dose adjustments this admission: N/A  Microbiology results: 8/3 AFB: NEG 8/2 blood cx: NGTD 8/2 sputum: not acceptable for testing 8/3 BAL: yeast 8/2 LP: 8/2 Crypto: positive 8/3 CSF - yeast 8/3 MRSA - NEG   Kenneth Rivas, Kenneth Rivas 06/21/2017 12:30 PM

## 2017-06-21 NOTE — Progress Notes (Signed)
Brief Nutrition Note  Consult received for enteral/tube feeding initiation and management.  Adult Enteral Nutrition Protocol already initiated. This RD adjusted TF order to reflect recommendations provided on 8/3.   Admitting Dx: Hypokalemia [E87.6] Hyponatremia [E87.1] Cachexia associated with AIDS (HCC) [B20, R64]  Body mass index is 18.22 kg/m. Pt meets criteria for underweight based on current BMI.  Labs:   Recent Labs Lab 06/19/17 0433 06/20/17 0249 06/21/17 0226  NA 122* 122* 125*  K 3.6 2.9* 3.8  CL 86* 92* 97*  CO2 29 26 23   BUN 8 9 8   CREATININE 0.47* 0.56* 0.72  CALCIUM 8.7* 7.7* 7.8*  MG 1.7 1.4* 2.1  PHOS 3.1 3.3 2.8  GLUCOSE 109* 118* 102*    Kenneth FrancoLindsey Marcy Sookdeo, MS, RD, LDN Pager: [phone] After Hours Pager: [phone]

## 2017-06-21 NOTE — Progress Notes (Signed)
Patient tolerated Lumbar puncture without difficulty. Patient to remain flat for 4 hours

## 2017-06-21 NOTE — Progress Notes (Signed)
Saw and examined [name] this morning.  He was alert and interactive, but still intubated.  He indicated that he wanted the tube out soon.  He denied pain.  Is being managed for cryptococcal meningitis, and was found to have pseudomonal pneumonia yesterday.  His nurse asked why he was still on scheduled acetaminophen, and I told her that the CCM could better answer her question since they were actively managing him currently.  We appreciate the excellent care provided by the CCM team and will be happy to resume care when patient is transferred back to our service.

## 2017-06-21 NOTE — Progress Notes (Addendum)
[place] for Infectious Disease    Date of Admission:  06/16/2017   Total days of antibiotics 4        Day 4 ampho/flucytosine        Day 3 vanco        Day 1 ceftaz   ID: Kenneth Rivas is a 56 y.o. 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 No new findings  Afebrile, comfortable on vent  Medications:  . acetaminophen  650 mg Per Tube QID  . amLODipine  5 mg Per Tube Daily  . azithromycin  1,200 mg Per Tube Weekly  . chlorhexidine gluconate (MEDLINE KIT)  15 mL Mouth Rinse BID  . dextrose  10 mL Intravenous Q24H  . dextrose  10 mL Intravenous Q24H  . feeding supplement (PRO-STAT SUGAR FREE 64)  30 mL Per Tube BID  . feeding supplement (VITAL HIGH PROTEIN)  1,000 mL Per Tube Q24H  . flucytosine  25 mg/kg Per Tube Q6H  . mouth rinse  15 mL Mouth Rinse QID  . pantoprazole (PROTONIX) IV  40 mg Intravenous Daily  . sulfamethoxazole-trimethoprim  1 tablet Per Tube Daily    Objective: Vital signs in last 24 hours: Temp:  [95.5 F (35.3 C)-98.7 F (37.1 C)] 97.6 F (36.4 C) (08/05 0813) Pulse Rate:  [64-89] 66 (08/05 1000) Resp:  [11-23] 12 (08/05 1000) BP: (83-174)/(60-110) 155/101 (08/05 1000) SpO2:  [92 %-100 %] 100 % (08/05 1148) FiO2 (%):  [40 %] 40 % (08/05 1148) Weight:  [112 lb 14 oz (51.2 kg)] 112 lb 14 oz (51.2 kg) (08/05 0600) Physical Exam  Constitutional:  He is intubated/eyes open, denies discomfort with ET tube . He appears chronically ill, wasted. No distress.  HENT: intubated, og shows serosanginous with debris in suction drain.pupils reactive Mouth/Throat: OETT in place  Cardiovascular: Normal rate, regular rhythm and normal heart sounds. Exam reveals no gallop and no friction rub.  No murmur heard.  Pulmonary/Chest: Effort normal and breath sounds normal. No respiratory distress. He has no wheezes.  Neurological: sedated Ext: wasted extremities Skin: dry with scattered hyperpigmented scars  Lab Results  Recent Labs  06/20/17 0249 06/21/17 0226  WBC 3.6* 4.1  HGB 9.9* 8.7*  HCT 29.3* 26.3*  NA 122* 125*  K 2.9* 3.8  CL 92* 97*  CO2 26 23  BUN 9 8  CREATININE 0.56* 0.72    Microbiology: Csf cr ag is 1:2560+ Csf profile : WBC of 10, 90% L, glu 36, prot 68, + yeast on gram stain BAL cx: +fungal, and 20,000 PsA Studies/Results: Dg Abd 1 View  Result Date: 06/20/2017 CLINICAL DATA:  Nasogastric tube placement. EXAM: ABDOMEN - 1 VIEW COMPARISON:  None. FINDINGS: Nasogastric tube appears adequately positioned in the stomach, with proximal side holes below the level of the diaphragm. Visualized bowel gas pattern is nonobstructive. IMPRESSION: Nasogastric tube adequately positioned  in the stomach. Electronically Signed   By: Franki Cabot M.D.   On: 06/20/2017 14:33   Dg Chest Port 1 View  Result Date: 06/19/2017 CLINICAL DATA:  Lung abscess EXAM: PORTABLE CHEST 1 VIEW COMPARISON:  06/16/2017 FINDINGS: Endotracheal tube placed with its tip 4.6 cm from the carina. The heart is normal in size. Lungs are clear. No pneumothorax or pleural effusion. Lungs remain hyperaerated. IMPRESSION: Endotracheal tube in place.  Clear lungs. Electronically Signed   By: Marybelle Killings M.D.   On: 06/19/2017 15:00     Assessment/Plan:  Disseminated cryptococcal disease/meningitis with high opening pressure = for now will recommend daily LP but will  reach out to NSGY on Monday to see if they can do EVD  Vs. LD to monitor pressure and CSF output while on treatment with ampho plus flucytosis. Will need 14 days of induction with current regimen  Hypokalemia = likely related to ampho +/- refeeding syndrome. Improved. Continue to monitor while on ampho  Pneumonia = likely cryptococcal but also may bacterial infection. BAL culture grew 20,000 colonies of PsA. Agree with narrowing to ceftaz.   There are 2  pulmonary specimen negative for AFB. Can d/c airborne precautions after 3rd specimen is negative  HIV/AIDS = will post pone initiating ART for at least 5 wk to minimize risk of IRIS  severe protein-caloric malnutrtion = on tubefeeds, watching electrolytes for refeeding syndrome  Baxter Flattery [facility] for Infectious Diseases Cell: [phone] Pager: [phone]  06/21/2017, 12:07 PM

## 2017-06-21 NOTE — Progress Notes (Signed)
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 Admitted on 7/31 w/ cc: weakness and FTT.  Admitting dx: FTT and cachexia d/t AIDs, pulmonary nodules, bedbugs, and HTn. Had CT chest showing cavitary lung lesion in the RML. ID was consulted and ordered serum crypto, this was positive. The patient was complaining of headache. An attempt to have LP on 8/2 was unsuccessful due to what was felt to be positional hypoxia during prone positioning. PCCM was asked to see him on 8/3 for formal consult w/ request to obtain LP as well as bronchoscopy/ BAL for AFB. As active TB and or cryptococcal infection would be contra-indication to resuming ARVs acutely   Subjective Awake interactive  VITAL SIGNS: BP (!) 155/101   Pulse 66   Temp 97.6 F (36.4 C) (Oral)   Resp 12   Ht _0  (1.676 m)   Wt 112 lb 14 oz (51.2 kg)   SpO2 100%   BMI 18.22 kg/m   HEMODYNAMICS:    VENTILATOR SETTINGS: Vent Mode: PSV;CPAP FiO2 (%):  [40 %] 40 % Set Rate:  [12 bmp] 12 bmp Vt Set:  [500 mL] 500 mL PEEP:  [5 cmH20] 5 cmH20 Pressure Support:  [10 cmH20] 10 cmH20 Plateau Pressure:  [12 cmH20-15 cmH20] 15 cmH20  INTAKE / OUTPUT: I/O last 3 completed shifts: In: 76 [I.V.:2948; NG/GT:150; IV Piggyback:3450] Out: 3505 [Urine:3505]  General appearance:  56 Year old  Malecachectic  NAD,  conversant  Eyes: anicteric sclerae , moist conjunctivae; PERRL, EOMI bilaterally. Mouth:  membranes and no mucosal ulcerations; orally intubated Neck: Trachea midline; neck supple, no JVD Lungs/chest: CTA, with normal respiratory effort and no  intercostal retractions CV: RRR, no MRGs  Abdomen: Soft, non-tender; no masses or HSM Extremities: No peripheral edema or extremity lymphadenopathy Skin: Normal temperature, turgor and texture; no rash, ulcers or subcutaneous nodules Psych: Appropriate affect, alert and oriented moves all extremities   LABS:  BMET  Recent Labs Lab 06/19/17 0433 06/20/17 0249 06/21/17 0226  NA 122* 122* 125*  K 3.6 2.9* 3.8  CL 86* 92* 97*  CO2 _1 BUN _2 CREATININE 0.47* 0.56* 0.72  GLUCOSE 109* 118* 102*    Electrolytes  Recent Labs Lab 06/19/17 0433 06/20/17 0249 06/21/17 0226  CALCIUM 8.7* 7.7* 7.8*  MG 1.7 1.4* 2.1  PHOS 3.1 3.3 2.8    CBC  Recent Labs Lab 06/19/17 0433 06/20/17 0249 06/21/17 0226  WBC 4.3 3.6* 4.1  HGB 12.3* 9.9* 8.7*  HCT 37.1* 29.3* 26.3*  PLT 108* 71* 92*    Coag's No results for input(s): APTT, INR in the last 168 hours.  Sepsis Markers  Recent Labs Lab 06/16/17 1409 06/16/17 1924  LATICACIDVEN 2.90* 2.03*    ABG  Recent Labs Lab 06/19/17 2052 06/20/17 0515  PHART 7.549* 7.434  PCO2ART 37.7 39.9  PO2ART 503.0* 163*    Liver Enzymes  Recent Labs Lab 06/16/17 1915  AST 19  ALT 13*  ALKPHOS 58  BILITOT 0.8  ALBUMIN 3.1*    Cardiac Enzymes No results for input(s): TROPONINI, PROBNP in  the last 168 hours.  Glucose  Recent Labs Lab 06/19/17 1227  GLUCAP 111*    Imaging Dg Abd 1 View  Result Date: 06/20/2017 CLINICAL DATA:  Nasogastric tube placement. EXAM: ABDOMEN - 1 VIEW COMPARISON:  None. FINDINGS: Nasogastric tube appears adequately positioned in the stomach, with proximal side holes below the level of the diaphragm. Visualized bowel gas pattern is nonobstructive. IMPRESSION: Nasogastric tube adequately positioned in the stomach. Electronically Signed   By: Franki Cabot M.D.   On: 06/20/2017 14:33     STUDIES:  CT chest 8/1: 7 mm cavitary lesion is seen in the right middle lobe. It is uncertain  if this represents cavitary metastatic lesion or small pulmonary abscess. Clinical  CT head 8/2: Progression of generalized atrophy which may be due to HIV infection.Interval development of diffuse white matter disease in the periventricular white matter with focal hypodensity left inferior frontal lobe. This could be due to chronic ischemia however in the setting of HIV also consider infection and less likely tumor. 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 Also w/ cavitary RML lung nodule rasing concern for either pulmonary cryptococcal involvement or some other opportunistic infection.  Cont full vent support over we F/u BAL and CSF labs changing abx Serial LPs  ASSESSMENT / PLAN:  PULMONARY A: Intubated for airway protection RML cavitary lung nodule Pseudomonas PNA  P:   Full vent support If can't get EVD then perhaps wean in am Cont PAD protocol Can prob dc air born  CARDIOVASCULAR A:  HTN  P:  Cont tele and antihypertensives  RENAL A:   hyponatremia -->stable;  hypomagnesemia and hypokalemia TSH nml P:   Cont current NS rx (has to have bolus prior to amphotericin ) F/u am chemistry   GASTROINTESTINAL A:   Severe protein calorie malnutrition w/ failure to thrive  P:   tubefeeds and PPI  HEMATOLOGIC A:   Anemia of chronic disease.  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 AFB negative.  -> P:   Cont airborn prec and contact prec Cont amphotericin and flucytosine started 8/2 Dc vanc Change zosyn to ceftaz 8/5>>> F/u PCP smear F/u  JC virus  NEUROLOGIC A:   Head ache  Cryptococcal meningitis  ->both IR and N-surg refusing to place drain  P:   RAS goal 0 to -1 LP again today for goal closing pressure of < 18 Ask n-surg again on Monday about EVD   Endocrine A: Hyponatremia.  R/o SIADH P: See renal sxn  FAMILY  - Updates:   - Inter-disciplinary family meet or Palliative Care meeting due by: 1 week  My cct 30 min  Erick Colace ACNP-BC [place] Pager # [phone] OR # [phone] if  no answer  Attending Note:  I have examined patient, reviewed labs, studies and notes. I have discussed the case with Jerrye Bushy, and I agree with the data and plans as amended above. 56 year old man with HIV/AIDS, medical noncompliance, history of substance abuse. He has systemic cryptococcal infection with CNS involvement, probably cryptococcal cavitary pneumonia in the right middle lobe. BAL performed 8/3 showed yeast, pseudomonas (pansensitive), negative AFB. Currently on broad-spectrum antimicrobials including amphotericin B and flucytosine. He tolerated repeat lumbar puncture on 8/4. On evaluation today he is awake, interacts. He is mechanically ventilated and on airborne precautions. Thin cachectic man in no apparent distress. Lungs are coarse bilaterally. Heart regular with no murmur. Abdomen soft, positive bowel sounds. We will continue his current antibiotics, antifungals. Continue anti-retroviral therapy. He will need a repeat LP and drainage today. Question  whether he can get a lumbar drain placed him feel he will continue need serial decompression. Independent critical care time is 35 minutes.   Baltazar Apo, MD, PhD 06/21/2017, 2:01 PM [facility] [phone] or if no answer [phone]

## 2017-06-22 ENCOUNTER — Inpatient Hospital Stay (HOSPITAL_COMMUNITY): Payer: Medicaid Other

## 2017-06-22 LAB — BLOOD CULTURE ID PANEL (REFLEXED)
Acinetobacter baumannii: NOT DETECTED
CANDIDA KRUSEI: NOT DETECTED
CANDIDA PARAPSILOSIS: NOT DETECTED
Candida albicans: NOT DETECTED
Candida glabrata: NOT DETECTED
Candida tropicalis: NOT DETECTED
ENTEROCOCCUS SPECIES: NOT DETECTED
ESCHERICHIA COLI: NOT DETECTED
Enterobacter cloacae complex: NOT DETECTED
Enterobacteriaceae species: NOT DETECTED
HAEMOPHILUS INFLUENZAE: NOT DETECTED
Klebsiella oxytoca: NOT DETECTED
Klebsiella pneumoniae: NOT DETECTED
LISTERIA MONOCYTOGENES: NOT DETECTED
Neisseria meningitidis: NOT DETECTED
PROTEUS SPECIES: NOT DETECTED
PSEUDOMONAS AERUGINOSA: NOT DETECTED
SERRATIA MARCESCENS: NOT DETECTED
STAPHYLOCOCCUS AUREUS BCID: NOT DETECTED
STAPHYLOCOCCUS SPECIES: NOT DETECTED
STREPTOCOCCUS PNEUMONIAE: NOT DETECTED
STREPTOCOCCUS PYOGENES: NOT DETECTED
STREPTOCOCCUS SPECIES: NOT DETECTED
Streptococcus agalactiae: NOT DETECTED

## 2017-06-22 LAB — GLUCOSE, CAPILLARY
GLUCOSE-CAPILLARY: 105 mg/dL — AB (ref 65–99)
GLUCOSE-CAPILLARY: 109 mg/dL — AB (ref 65–99)
GLUCOSE-CAPILLARY: 99 mg/dL (ref 65–99)
Glucose-Capillary: 123 mg/dL — ABNORMAL HIGH (ref 65–99)
Glucose-Capillary: 85 mg/dL (ref 65–99)

## 2017-06-22 LAB — CSF CELL COUNT WITH DIFFERENTIAL
Eosinophils, CSF: 1 % (ref 0–1)
LYMPHS CSF: 66 % (ref 40–80)
MONOCYTE-MACROPHAGE-SPINAL FLUID: 22 % (ref 15–45)
RBC Count, CSF: 22 /mm3 — ABNORMAL HIGH
SEGMENTED NEUTROPHILS-CSF: 11 % — AB (ref 0–6)
TUBE #: 1
WBC CSF: 18 /mm3 — AB (ref 0–5)

## 2017-06-22 LAB — BASIC METABOLIC PANEL
ANION GAP: 9 (ref 5–15)
ANION GAP: 9 (ref 5–15)
BUN: 5 mg/dL — ABNORMAL LOW (ref 6–20)
BUN: 6 mg/dL (ref 6–20)
CHLORIDE: 98 mmol/L — AB (ref 101–111)
CHLORIDE: 101 mmol/L (ref 101–111)
CO2: 23 mmol/L (ref 22–32)
CO2: 20 mmol/L — AB (ref 22–32)
Calcium: 8 mg/dL — ABNORMAL LOW (ref 8.9–10.3)
Calcium: 8 mg/dL — ABNORMAL LOW (ref 8.9–10.3)
Creatinine, Ser: 0.64 mg/dL (ref 0.61–1.24)
Creatinine, Ser: 0.56 mg/dL — ABNORMAL LOW (ref 0.61–1.24)
GFR calc non Af Amer: >60 mL/min (ref >60)
GFR calc non Af Amer: >60 mL/min (ref >60)
GLUCOSE: 102 mg/dL — AB (ref 65–99)
Glucose, Bld: 104 mg/dL — ABNORMAL HIGH (ref 65–99)
POTASSIUM: 3 mmol/L — AB (ref 3.5–5.1)
Potassium: 4.4 mmol/L (ref 3.5–5.1)
SODIUM: 130 mmol/L — AB (ref 135–145)
Sodium: 130 mmol/L — ABNORMAL LOW (ref 135–145)

## 2017-06-22 LAB — CBC
HEMATOCRIT: 27.6 % — AB (ref 39.0–52.0)
HEMOGLOBIN: 9.2 g/dL — AB (ref 13.0–17.0)
MCH: 26.8 pg (ref 26.0–34.0)
MCHC: 33.3 g/dL (ref 30.0–36.0)
MCV: 80.5 fL (ref 78.0–100.0)
Platelets: 133 10*3/uL — ABNORMAL LOW (ref 150–400)
RBC: 3.43 MIL/uL — AB (ref 4.22–5.81)
RDW: 14.1 % (ref 11.5–15.5)
WBC: 2.9 10*3/uL — AB (ref 4.0–10.5)

## 2017-06-22 LAB — CSF CULTURE W GRAM STAIN

## 2017-06-22 LAB — MAGNESIUM
MAGNESIUM: 1.7 mg/dL (ref 1.7–2.4)
MAGNESIUM: 2 mg/dL (ref 1.7–2.4)

## 2017-06-22 LAB — PNEUMOCYSTIS JIROVECI SMEAR BY DFA: PNEUMOCYSTIS JIROVECI AG: NEGATIVE

## 2017-06-22 LAB — PHOSPHORUS
PHOSPHORUS: 3.1 mg/dL (ref 2.5–4.6)
PHOSPHORUS: 3.1 mg/dL (ref 2.5–4.6)

## 2017-06-22 LAB — GLUCOSE, CSF: GLUCOSE CSF: 29 mg/dL — AB (ref 40–70)

## 2017-06-22 LAB — CSF CULTURE

## 2017-06-22 LAB — ACID FAST SMEAR (AFB, MYCOBACTERIA): Acid Fast Smear: NEGATIVE

## 2017-06-22 LAB — PROTEIN, CSF: TOTAL PROTEIN, CSF: 140 mg/dL — AB (ref 15–45)

## 2017-06-22 MED ORDER — VITAL AF 1.2 CAL PO LIQD
1000.0000 mL | ORAL | Status: DC
  Administered 2017-06-22 – 2017-06-28 (×5): 1000 mL
  Filled 2017-06-22 (×3): qty 1000

## 2017-06-22 MED ORDER — DIPHENHYDRAMINE HCL 12.5 MG/5ML PO ELIX
25.0000 mg | ORAL_SOLUTION | Freq: Four times a day (QID) | ORAL | Status: DC | PRN
  Filled 2017-06-22: qty 10

## 2017-06-22 MED ORDER — LIDOCAINE HCL (PF) 1 % IJ SOLN
INTRAMUSCULAR | Status: AC
  Administered 2017-06-22: 2 mL via INTRADERMAL
  Filled 2017-06-22: qty 5

## 2017-06-22 MED ORDER — MAGNESIUM SULFATE IN D5W 1-5 GM/100ML-% IV SOLN
1.0000 g | Freq: Once | INTRAVENOUS | Status: AC
  Administered 2017-06-22: 1 g via INTRAVENOUS
  Filled 2017-06-22: qty 100

## 2017-06-22 MED ORDER — FLUCYTOSINE 250 MG PO CAPS
1250.0000 mg | ORAL_CAPSULE | Freq: Four times a day (QID) | ORAL | Status: DC
  Administered 2017-06-22 – 2017-06-24 (×8): 1250 mg
  Filled 2017-06-22 (×9): qty 5

## 2017-06-22 MED ORDER — POTASSIUM CHLORIDE 20 MEQ/15ML (10%) PO SOLN
30.0000 meq | ORAL | Status: AC
  Administered 2017-06-22 (×2): 30 meq via ORAL
  Filled 2017-06-22 (×3): qty 30

## 2017-06-22 MED ORDER — LIDOCAINE HCL (PF) 1 % IJ SOLN
5.0000 mL | Freq: Once | INTRAMUSCULAR | Status: AC
  Administered 2017-06-22: 2 mL via INTRADERMAL

## 2017-06-22 MED ORDER — FLUCYTOSINE 500 MG PO CAPS
25.0000 mg/kg | ORAL_CAPSULE | Freq: Four times a day (QID) | ORAL | Status: DC
  Filled 2017-06-22: qty 1

## 2017-06-22 MED ORDER — POTASSIUM CHLORIDE 10 MEQ/100ML IV SOLN: 10.0000 meq | INTRAVENOUS | Status: DC

## 2017-06-22 MED ORDER — DEXTROSE 5 % IV SOLN
4.0000 mg/kg | INTRAVENOUS | Status: DC
  Administered 2017-06-22 – 2017-07-12 (×21): 200 mg via INTRAVENOUS
  Filled 2017-06-22 (×24): qty 200

## 2017-06-22 MED ORDER — POTASSIUM CHLORIDE 10 MEQ/100ML IV SOLN
10.0000 meq | INTRAVENOUS | Status: AC
  Administered 2017-06-22 (×2): 10 meq via INTRAVENOUS
  Filled 2017-06-22 (×2): qty 100

## 2017-06-22 MED ORDER — PROPOFOL 1000 MG/100ML IV EMUL: 5.0000 ug/kg/min | INTRAVENOUS | Status: DC

## 2017-06-22 NOTE — Progress Notes (Signed)
Saw and examined [name] this morning.  He was interactive and answered appropriately to yes and no questions.  He denied pain.  Getting serial LPs to control ICP, being treated for cryptococcal meningitis plus bacterial pneumonia in addition to prophylaxis for AIDS related infections.  We appreciate the excellent care provided by the CCM team and will resume care once he is transferred back to our service.

## 2017-06-22 NOTE — Progress Notes (Signed)
Nutrition Consult / Follow-up  DOCUMENTATION CODES:   Underweight, Severe malnutrition in context of chronic illness  INTERVENTION:    Continue to increase Vital AF 1.2 by 10 ml every 12 hours to goal rate of 50 ml/h (1200 ml/day)  Provides 1440 kcal, 90 gm protein, 973 ml free water  NUTRITION DIAGNOSIS:   Malnutrition (Severe) related to chronic illness (HIV) as evidenced by severe depletion of body fat, severe depletion of muscle mass, energy intake < 75% for > or equal to 1 month.  Ongoing  GOAL:   Patient will meet greater than or equal to 90% of their needs  Being addressed with TF  MONITOR:   Vent status, I & O's  REASON FOR ASSESSMENT:   Consult Enteral/tube feeding initiation and management  ASSESSMENT:   Pt admitted with bed bugs, cachexia and feeling weak and tired x past 4 weeks, presumed cryptoccoal meningitis. PMH is significant for AIDS, hep B, cocaine use, and HTN.    Received MD Consult for TF initiation and management. Currently receiving Vital AF 1.2 at 30 ml/h via NGT, increasing slowly to goal of 50 ml/h due to risk for refeeding syndrome. Low potassium is being replaced.  Patient remains intubated on ventilator support, failed SBT today. MV: 9.3 L/min Temp (24hrs), Avg:97.8 F (36.6 C), Min:97.5 F (36.4 C), Max:98.2 F (36.8 C)   Labs reviewed: sodium 130 (L), potassium 3 (L) Medications reviewed and include KCl.  Diet Order:  Diet NPO time specified  Skin:  Reviewed, no issues  Last BM:  7/31  Height:   Ht Readings from Last 1 Encounters:  06/19/17 5\' 6"  (1.676 m)    Weight:   Wt Readings from Last 1 Encounters:  06/22/17 109 lb 2 oz (49.5 kg)    Ideal Body Weight:  50 kg  BMI:  Body mass index is 17.61 kg/m.  Estimated Nutritional Needs:   Kcal:  1467  Protein:  75-90 grams  Fluid:  > 1.5 L  EDUCATION NEEDS:   Education needs addressed  Joaquin CourtsKimberly Rossana Molchan, RD, LDN, CNSC Pager [phone] After Hours Pager  [phone]

## 2017-06-22 NOTE — Progress Notes (Signed)
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 Admitted on 7/31 w/ cc: weakness and FTT.  Admitting dx: FTT and cachexia d/t AIDs, pulmonary nodules, bedbugs, and HTn. Had CT chest showing cavitary lung lesion in the RML. ID was consulted and ordered serum crypto, this was positive. The patient was complaining of headache. An attempt to have LP on 8/2 was unsuccessful due to what was felt to be positional hypoxia during prone positioning. PCCM was asked to see him on 8/3 for formal consult w/ request to obtain LP as well as bronchoscopy/ BAL for AFB. As active TB and or cryptococcal infection would be contra-indication to resuming ARVs acutely   Subjective Awake interactive on vent.  OP <18cmH2O on LP yesterday.  Failed SBT this am.   VITAL SIGNS: BP (!) 143/87   Pulse 75   Temp 97.7 F (36.5 C) (Oral)   Resp 18   Ht _0  (1.676 m)   Wt 49.5 kg (109 lb 2 oz)   SpO2 100%   BMI 17.61 kg/m   HEMODYNAMICS:    VENTILATOR SETTINGS: Vent Mode: PRVC FiO2 (%):  [40 %] 40 % Set Rate:  [12 bmp] 12 bmp Vt Set:  [500 mL] 500 mL PEEP:  [5 cmH20] 5 cmH20 Plateau Pressure:  [14 cmH20-15 cmH20] 15 cmH20  INTAKE / OUTPUT: I/O last 3 completed shifts: In: 6176.1 [I.V.:2372.1; NG/GT:454; IV Piggyback:3350] Out: 4536 Katz.Darting; Emesis/NG output:875]   General:  Cachectic male, NAD on vent,  HEENT: MM pink/moist, ETT Neuro: awake, alert, nods appropriately CV: s1s2 rrr, no m/r/g PULM: even/non-labored on vent, failed SBT r/t poor effort, diminished bases  IW:OEHO, non-tender, bsx4 active  Extremities:  warm/dry, *no edema  Skin: no rashes or lesions   LABS:  BMET  Recent Labs Lab 06/20/17 0249 06/21/17 0226 06/22/17 0548  NA 122* 125* 130*  K 2.9* 3.8 3.0*  CL 92* 97* 98*  CO2 _1 BUN 9 8 5*  CREATININE 0.56* 0.72 0.64  GLUCOSE 118* 102* 104*    Electrolytes  Recent Labs Lab 06/20/17 0249 06/21/17 0226 06/21/17 1256 06/21/17 1832 06/22/17 0548  CALCIUM 7.7* 7.8*  --   --  8.0*  MG 1.4* 2.1 1.9 1.7 1.7  PHOS 3.3 2.8 3.3 3.4 3.1    CBC  Recent Labs Lab 06/20/17 0249 06/21/17 0226 06/22/17 0548  WBC 3.6* 4.1 2.9*  HGB 9.9* 8.7* 9.2*  HCT 29.3* 26.3* 27.6*  PLT 71* 92* 133*    Coag's No results for input(s): APTT, INR in the last 168 hours.  Sepsis Markers  Recent Labs Lab 06/16/17 1409 06/16/17 1924  LATICACIDVEN 2.90* 2.03*    ABG  Recent Labs Lab 06/19/17 2052 06/20/17 0515  PHART 7.549* 7.434  PCO2ART 37.7 39.9  PO2ART 503.0* 163*    Liver Enzymes  Recent Labs Lab 06/16/17 1915  AST 19  ALT 13*  ALKPHOS 58  BILITOT 0.8  ALBUMIN 3.1*    Cardiac Enzymes No results for input(s): TROPONINI, PROBNP in the last 168 hours.  Glucose  Recent Labs Lab 06/19/17 1227 06/22/17 0741  GLUCAP 111* 99    Imaging No results found.   STUDIES:  CT chest 8/1: 7 mm cavitary lesion is seen in the right middle lobe. It is uncertain if this represents cavitary metastatic lesion or small pulmonary abscess. Clinical  CT head 8/2: Progression of generalized atrophy which may be due to HIV infection.Interval development of diffuse white matter disease in the periventricular white matter with focal hypodensity left inferior frontal lobe. This could be due to chronic ischemia however in the setting of HIV also consider infection and less likely tumor. LP 8/3:  Csf cell count and diff>>>clear, wbc 1, lymph 90 CSF protein>>>68 CSF glucose>>>36 CSF cryptococcal AG>>>POSITIVE CSF JV virus>>>  CULTURES: CD4 10 Cryptococcal  antigen 8/2: positive QFgold 8/2>>>indeterm.  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 Also w/ cavitary RML lung nodule rasing concern for either pulmonary cryptococcal involvement or some other opportunistic infection.   ASSESSMENT / PLAN:  PULMONARY A: Intubated for airway protection RML cavitary lung nodule Pseudomonas PNA  P:   Full vent support Daily SBT - poor effort this am  If can't get EVD then perhaps wean in am Cont PAD protocol Can prob dc air born - defer to ID  CARDIOVASCULAR A:  HTN  P:  Cont tele  Continue norvasc, PRN hydralazine  RENAL A:   hyponatremia -->stable;  hypomagnesemia and hypokalemia TSH nml P:   Cont current NS rx (has to have bolus prior to amphotericin ) F/u am chemistry   GASTROINTESTINAL A:   Severe protein calorie malnutrition w/ failure to thrive  P:   tubefeeds and PPI  HEMATOLOGIC A:   Anemia of chronic disease.  Severe thrombocytopenia  P:  Serial CBC SCDs  INFECTIOUS A:   Disseminated cryptococcal infection w/ possible CNS involvement and concern for pulmonary involvement  Cavitary Pulmonary nodule: ddx: cryptococcal, Tb, or other possible opportunistic infection AIDS Hep B Bed  Bugs Csf + yeast AND cryptococcal antigen + ->BAL w/ 20K pseudomonas, final still pending. AFB negative.  -> P:   ID following  Cont airborn prec and contact prec Cont amphotericin and flucytosine started 8/2 Dc vanc Change zosyn to ceftaz 8/5>>> F/u PCP smear F/u  JC virus  NEUROLOGIC A:   Head ache  Cryptococcal meningitis  ->both IR and N-surg refusing to place drain  P:    RAS goal 0 to -1 Continue serial LP's - will repeat 8/6 for goal closing pressure of < 18 Discuss with nsgy about possible EVD   Endocrine A: Hyponatremia.  R/o SIADH P: See renal sxn  FAMILY  - Updates: no family available 8/6  - Inter-disciplinary family meet or Palliative Care meeting due by: 1 week   Nickolas Madrid, NP 06/22/2017  10:43 AM Pager: [phone] or [phone]

## 2017-06-22 NOTE — Plan of Care (Signed)
Problem: Activity: Goal: Ability to tolerate increased activity will improve Outcome: Progressing Pt tolerates ROM to all extremities, assists in turning. Able to follow commands, and tolerated HOB greater than 30degrees. Will continue to monitor activity status.   Problem: Nutritional: Goal: Intake of prescribed amount of daily calories will improve Outcome: Progressing Pt on tube feeds Vital AF 1.2 currently at 6930ml/hr (increasing q12h until goal reached) tolerating with no signs of sickness, n/v/d or abd distention. Will continue to monitor nutritional status.   Problem: Respiratory: Goal: Ability to maintain a clear airway and adequate ventilation will improve Outcome: Progressing Pt tolerating ventilator. No difficulty breathing noted with O2 saturations 98-100%. Tolerated suctioning thru ETT as well as orally. Minimal thick/yellow secretions noted. Plan to wean pt in am.   Problem: Skin Integrity Impairment Risk: Goal: Risk for impaired skin integrity will decrease Outcome: Progressing Pt skin intact. Q2h turning and repositioning done to prevent pressure areas. Foam to sacrum prophylactically. Will continue to monitor skin integrity.

## 2017-06-22 NOTE — Progress Notes (Signed)
PCCM Attending Note:  Case discussed with ID. Patient has had 4 AFB smears that were negative. Per guidelines patient can be removed from TB isolation. Proceeding with lumbar puncture and removal of approximately 10 mL of CSF after recording opening pressure.  Donna ChristenJennings E. Jamison NeighborNestor, M.D. [facility] Pager:  [phone] After 3pm or if no response, call [phone] 2:02 PM 06/22/17

## 2017-06-22 NOTE — Progress Notes (Signed)
Mr. Kenneth LinseyDudley's weight has increased over 10% during the course of his stay. Nursing confirmed his current weight. After speaking with ID, pharmacy readjusted his amphotericin B, flucytosine, and propofol based on the most current weight (49.5 kg).  Nolen MuAustin J Rorey Hodges PharmD PGY1 Pharmacy Practice Resident 06/22/2017 10:30 AM Pager: [phone]

## 2017-06-22 NOTE — Progress Notes (Signed)
Dr. Isaiah SergeMannam called and made aware of pt's spinal fluid having enteric yeast, WBC 18, and glucose 29.

## 2017-06-22 NOTE — Progress Notes (Signed)
      INFECTIOUS DISEASE ATTENDING ADDENDUM:   Date: 06/22/2017  Patient name: Kenneth Rivas  Medical record number: [id number]  Date of birth: [birth year]    This patient has been seen and discussed with the house staff. Please see the resident's note for complete details. I concur with their findings with the following additions/corrections:  Kenneth Rivas was alert and answering questions on the the ventilator this am.   I can see that his OP was <18 cmH20 yesterday with LP by Dr. Delton CoombesByrum  #1 Cryptococcal meningitis:  Continue ambisome, flucytosine  Replete mag, k  ICP control is critical. Good to hear the OP are coming down  I agree with LP today and then later in the week if still encouraging.  It may take a few days without LP for the pressures to build up  I still worry long term about risk of pressures not being well controlled esp after we get done with treating the crypto and insitute ARV  AGAIN long term my instinct here is [facility] given how high his initial OP were but perhaps we are getting control with serial LPS. Prior to intubation they were risky  #2 Pulmonary cavitary pathology: likely due to crypto + bacterial  Continue ambisome,flucytosine, ceftaz  #3 AIDS: continue OI prophylaxis, defere ARV x 5 weeks  #4 HIV neurocogntive disorder: assess better when off the ventilator  #5 Wasting: will need nutritional support   I spent greater than 35 minutes with the patient including greater than 50% of time in face to face counsel of the patient and in coordination of their care.    Acey LavCornelius Van Dam 06/22/2017, 8:47 AM

## 2017-06-22 NOTE — Progress Notes (Signed)
PT Cancellation Note  Patient Details Name: Kenneth Rivas MRN: [id number] DOB: Jul 25, 1961   Cancelled Treatment:    Reason Eval/Treat Not Completed: Medical issues which prohibited therapy. Pt remains intubated and to have another LP today.   Angelina OkCary W Maycok 06/22/2017, 10:51 AM Skip Mayerary Maleko Greulich PT [phone]

## 2017-06-22 NOTE — Clinical Social Work Note (Signed)
Clinical Social Work Assessment  Patient Details  Name: Kenneth Rivas MRN: [id number] Date of Birth: June 03, 1961  Date of referral:  06/22/17               Reason for consult:  Facility Placement                Permission sought to share information with:  Family Supports Permission granted to share information::  Yes, Verbal Permission Granted  Name::        Agency::     Relationship::     Contact Information:     Housing/Transportation Living arrangements for the past 2 months:  Single Family Home [employer]([facility] House ) Source of Information:  Other (Comment Required) (pt's sister Aggie Cosierheresa. ) Patient Interpreter Needed:  None Criminal Activity/Legal Involvement Pertinent to Current Situation/Hospitalization:  No - Comment as needed Significant Relationships:  None (pt's sister informed CSW that she and pt havent spoken in awhile due to pt not coming around. ) Lives with:  Self Do you feel safe going back to the place where you live?  No (per pt's sister where pt has previously lived, it is not safe. ) Need for family participation in patient care:     Care giving concerns:  CSW had to speak with pt's sister Aggie Cosierheresa to gather information due to pt being intubated at this time. Pt's sister is concerned about living situation for pt as well as finical issues that may arise as a result of pt's hospital stay.   Social Worker assessment / plan:   CSW spoke with pt's sister Aggie Cosier(Theresa) due to pt currently being intubated. CSW was informed by Aggie Cosierheresa that [facility] [address] with a roommate named Richard for five years or more. CSW was also informed that pt hasn't been safe at this location and pt;s sister thinks that pt should seek new shelter once discharged from the hospital. Aggie Cosierheresa went on to inform CSW that she and pt have not always been close and that she will not be bale to care for pt as she has her own medical needs that she has to focus on first. Aggie Cosierheresa isnt sure if pt  attends church or has any supporters but mentioned that she does have daughter.   Aggie Cosierheresa asked about financial assistance for pt and was instructed that she could potentially speak with someone in financial counseling about her concerns regarding insurance.    Employment status:  Disabled (Comment on whether or not currently receiving Disability) Insurance information:  Self Pay (Medicaid Pending) PT Recommendations:  Skilled Nursing Facility Information / Referral to community resources:     Patient/Family's Response to care:  Pt's sister is agreeable to plan of care at this time and mentioned that she would address insurance concerns with pt once pt is no longer intubated and able to speak.   Patient/Family's Understanding of and Emotional Response to Diagnosis, Current Treatment, and Prognosis:  No further questions or concerns have been presented at this time.   Emotional Assessment Appearance:  Other (Comment Required Attitude/Demeanor/Rapport:  Unable to Assess Affect (typically observed):  Unable to Assess Orientation:  Fluctuating Orientation (Suspected and/or reported Sundowners) (pt is currently inubated. ) Alcohol / Substance use:  Not Applicable Psych involvement (Current and /or in the community):  No (Comment)  Discharge Needs  Concerns to be addressed:  Financial / Insurance Concerns (Per sister pt may be unable to afford bills for service after being discharged. ) Readmission within the last  30 days:  No Current discharge risk:  None Barriers to Discharge:  No Barriers Identified   Robb Matar, LCSWA 06/22/2017, 11:33 AM

## 2017-06-22 NOTE — Progress Notes (Signed)
OT Cancellation Note  Patient Details Name: Kenneth Rivas MRN: [id number] DOB: 1961/02/24   Cancelled Treatment:    Reason Eval/Treat Not Completed: Medical issues which prohibited therapy. Pt is intubated and has LP scheduled for later today.  Evern BioMayberry, [name] 06/22/2017, 1:06 PM\ [phone]

## 2017-06-22 NOTE — Progress Notes (Signed)
PHARMACY - PHYSICIAN COMMUNICATION CRITICAL VALUE ALERT - BLOOD CULTURE IDENTIFICATION (BCID)  Results for orders placed or performed during the hospital encounter of 06/16/17  Blood Culture ID Panel (Reflexed) (Collected: 06/18/2017  9:35 AM)  Result Value Ref Range   Enterococcus species NOT DETECTED NOT DETECTED   Listeria monocytogenes NOT DETECTED NOT DETECTED   Staphylococcus species NOT DETECTED NOT DETECTED   Staphylococcus aureus NOT DETECTED NOT DETECTED   Streptococcus species NOT DETECTED NOT DETECTED   Streptococcus agalactiae NOT DETECTED NOT DETECTED   Streptococcus pneumoniae NOT DETECTED NOT DETECTED   Streptococcus pyogenes NOT DETECTED NOT DETECTED   Acinetobacter baumannii NOT DETECTED NOT DETECTED   Enterobacteriaceae species NOT DETECTED NOT DETECTED   Enterobacter cloacae complex NOT DETECTED NOT DETECTED   Escherichia coli NOT DETECTED NOT DETECTED   Klebsiella oxytoca NOT DETECTED NOT DETECTED   Klebsiella pneumoniae NOT DETECTED NOT DETECTED   Proteus species NOT DETECTED NOT DETECTED   Serratia marcescens NOT DETECTED NOT DETECTED   Haemophilus influenzae NOT DETECTED NOT DETECTED   Neisseria meningitidis NOT DETECTED NOT DETECTED   Pseudomonas aeruginosa NOT DETECTED NOT DETECTED   Candida albicans NOT DETECTED NOT DETECTED   Candida glabrata NOT DETECTED NOT DETECTED   Candida krusei NOT DETECTED NOT DETECTED   Candida parapsilosis NOT DETECTED NOT DETECTED   Candida tropicalis NOT DETECTED NOT DETECTED    Name of physician (or Provider) Contacted: ID  Changes to prescribed antibiotics required:  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   Pollyann SamplesAndy Syd Newsome, PharmD, BCPS 06/22/2017, 10:33 AM

## 2017-06-22 NOTE — Progress Notes (Signed)
[facility]  Date of Admission:  06/16/2017   Total days of antibiotics 5        Day 5 amphotericin/flucytosine        Day 2 ceftazidime  ASSESSMENT AND PLAN:  Disseminated cryptococcal disease- Meningitis and Pneumonia:  CT scan on 06/17/17 shows a 7 mm cavitary lesion in the right middle lobe. AFB ordered to check for Tb as a cause of the cavitary lesion came back negative on 4/4 AFB smears. It is likely that the cavitary lesion is due to crytococcal infection in the lung which is being managed with amphotericin, flucytosine and ceftazidime for bacterial coverage. 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 Will consider in 5 weeks.   Protein Calorie Malnutrition K=3.0 likely due to low po intake. -Replete potassium and magnesium -Continue diet per nutrition recommendations   . acetaminophen (TYLENOL) oral liquid 160 mg/5 mL  650 mg Per Tube Q6H  . amLODipine  5 mg Per Tube Daily  . azithromycin  1,200 mg Per Tube Weekly  . chlorhexidine gluconate (MEDLINE KIT)  15 mL Mouth Rinse BID  . dextrose  10 mL Intravenous Q24H  . dextrose  10 mL Intravenous Q24H  . flucytosine  1,250 mg Per Tube Q6H  . lidocaine (PF)  5 mL Intradermal Once  . lidocaine (PF)      . mouth rinse  15 mL Mouth Rinse QID  . pantoprazole (PROTONIX) IV  40 mg Intravenous Daily  . sulfamethoxazole-trimethoprim  1 tablet Per Tube Daily    SUBJECTIVE: Kenneth Rivas was seen laying in his bed. He was responsive to questions by nodding/shaking his head. He denied having any sob, chest pain, or headaches.   Review of Systems: ROS none except above  No Known Allergies  OBJECTIVE: Vitals:   06/22/17 1300  06/22/17 1400 06/22/17 1500 06/22/17 1508  BP: (!) 126/91 (!) 119/92 125/83   Pulse: 68 72 70   Resp: _0 Temp:    98.2 F (36.8 C)  TempSrc:    Oral  SpO2: 100% 100% 100%   Weight:      Height:       Body mass index is 17.61 kg/m.  Physical Exam  Lab Results Lab Results  Component Value Date   WBC 2.9 (L) 06/22/2017   HGB 9.2 (L) 06/22/2017   HCT 27.6 (L) 06/22/2017   MCV 80.5 06/22/2017   PLT 133 (L) 06/22/2017    Lab Results  Component Value Date   CREATININE 0.64 06/22/2017   BUN 5 (L) 06/22/2017   NA 130 (L) 06/22/2017   K 3.0 (L) 06/22/2017   CL 98 (L) 06/22/2017   CO2 23 06/22/2017    Lab Results  Component Value Date   ALT 13 (L) 06/16/2017   AST 19 06/16/2017   ALKPHOS 58 06/16/2017   BILITOT 0.8 06/16/2017     Microbiology: Recent Results (from the past 240 hour(s))  Acid Fast Smear (AFB)     Status: None   Collection Time: 06/17/17 11:29 PM  Result Value Ref Range Status   AFB Specimen Processing Concentration  Final   Acid Fast Smear Negative  Final  Comment: (NOTE) Performed At: [facility] [place], [place] [id number] Lindon Romp MD BH:[id number]    Source (AFB) SPUTUM  Final  Culture, expectorated sputum-assessment     Status: None   Collection Time: 06/18/17  5:00 AM  Result Value Ref Range Status   Specimen Description SPUTUM  Final   Special Requests Immunocompromised  Final   Sputum evaluation   Final    Sputum specimen not acceptable for testing.  Please recollect.   Gram Stain Report Called to,Read Back By and Verified With: RN Anna Genre [phone] MLM    Report Status 06/18/2017 FINAL  Final  Acid Fast Smear (AFB)     Status: None   Collection Time: 06/18/17  5:00 AM  Result Value Ref Range Status   AFB Specimen Processing Concentration  Final   Acid Fast Smear Negative  Final    Comment: (NOTE) Performed At: [facility] [place], [place] [id number] Lindon Romp MD HD:[id number]    Source (AFB) SPUTUM  Final  Culture, blood (Routine X 2) w Reflex to ID Panel     Status: None (Preliminary result)   Collection Time: 06/18/17  9:35 AM  Result Value Ref Range Status   Specimen Description BLOOD LEFT HAND  Final   Special Requests   Final    BOTTLES DRAWN AEROBIC ONLY Blood Culture results may not be optimal due to an inadequate volume of blood received in culture bottles   Culture  Setup Time   Final    BUDDING YEAST SEEN AEROBIC BOTTLE ONLY Organism ID to follow CRITICAL RESULT CALLED TO, READ BACK BY AND VERIFIED WITH: N JOHNSTON 06/22/17 @ 43 M VESTAL    Culture YEAST  Final   Report Status PENDING  Incomplete  Blood Culture ID Panel (Reflexed)     Status: None   Collection Time: 06/18/17  9:35 AM  Result Value Ref Range Status   Enterococcus species NOT DETECTED NOT DETECTED Final   Listeria monocytogenes NOT DETECTED NOT DETECTED Final   Staphylococcus species NOT DETECTED NOT DETECTED Final   Staphylococcus aureus NOT DETECTED NOT DETECTED Final   Streptococcus species NOT DETECTED NOT DETECTED Final   Streptococcus agalactiae NOT DETECTED NOT DETECTED Final   Streptococcus pneumoniae NOT DETECTED NOT DETECTED Final   Streptococcus pyogenes NOT DETECTED NOT DETECTED Final   Acinetobacter baumannii NOT DETECTED NOT DETECTED Final   Enterobacteriaceae species NOT DETECTED NOT DETECTED Final   Enterobacter cloacae complex NOT DETECTED NOT DETECTED Final   Escherichia coli NOT DETECTED NOT DETECTED Final   Klebsiella oxytoca NOT DETECTED NOT DETECTED Final   Klebsiella pneumoniae NOT DETECTED NOT DETECTED Final   Proteus species NOT DETECTED NOT DETECTED Final   Serratia marcescens NOT DETECTED NOT DETECTED Final   Haemophilus influenzae NOT DETECTED NOT DETECTED Final   Neisseria meningitidis NOT DETECTED NOT DETECTED Final   Pseudomonas aeruginosa NOT DETECTED NOT DETECTED Final   Candida albicans NOT DETECTED NOT DETECTED Final    Candida glabrata NOT DETECTED NOT DETECTED Final   Candida krusei NOT DETECTED NOT DETECTED Final   Candida parapsilosis NOT DETECTED NOT DETECTED Final   Candida tropicalis NOT DETECTED NOT DETECTED Final  Culture, blood (Routine X 2) w Reflex to ID Panel     Status: None (Preliminary result)   Collection Time: 06/18/17  9:36 AM  Result Value Ref Range Status   Specimen Description BLOOD LEFT ARM  Final   Special Requests  Final    BOTTLES DRAWN AEROBIC ONLY Blood Culture results may not be optimal due to an inadequate volume of blood received in culture bottles   Culture  Setup Time   Final    BUDDING YEAST SEEN AEROBIC BOTTLE ONLY CRITICAL RESULT CALLED TO, READ BACK BY AND VERIFIED WITH: N JOHNSTON 06/22/17 @ 90 M VESTAL    Culture YEAST  Final   Report Status PENDING  Incomplete  MRSA PCR Screening     Status: None   Collection Time: 06/19/17 12:18 PM  Result Value Ref Range Status   MRSA by PCR NEGATIVE NEGATIVE Final    Comment:        The GeneXpert MRSA Assay (FDA approved for NASAL specimens only), is one component of a comprehensive MRSA colonization surveillance program. It is not intended to diagnose MRSA infection nor to guide or monitor treatment for MRSA infections.   Acid Fast Smear (AFB)     Status: None   Collection Time: 06/19/17  1:05 PM  Result Value Ref Range Status   AFB Specimen Processing Concentration  Final   Acid Fast Smear Negative  Final    Comment: (NOTE) Performed At: [facility] [name], [place] [id number] Lindon Romp MD YF:[id number]    Source (AFB) BRONCHIAL ALVEOLAR LAVAGE  Final  Acid Fast Smear (AFB)     Status: None   Collection Time: 06/19/17  1:05 PM  Result Value Ref Range Status   AFB Specimen Processing Concentration  Final   Acid Fast Smear Negative  Final    Comment: (NOTE) Performed At: [facility] [place], [place] [id number] Lindon Romp MD DJ:[id number]     Source (AFB) BRONCHIAL ALVEOLAR LAVAGE  Final  Culture, fungus without smear     Status: Abnormal (Preliminary result)   Collection Time: 06/19/17  1:05 PM  Result Value Ref Range Status   Specimen Description BRONCHIAL ALVEOLAR LAVAGE  Final   Special Requests Immunocompromised  Final   Culture CANDIDA ALBICANS (A)  Final   Report Status PENDING  Incomplete  Pneumocystis smear by DFA     Status: None   Collection Time: 06/19/17  1:05 PM  Result Value Ref Range Status   Specimen Source-PJSRC BRONCHIAL ALVEOLAR LAVAGE  Final   Pneumocystis jiroveci Ag NEGATIVE  Final    Comment: Performed at [facility]  Culture, bal-quantitative     Status: Abnormal   Collection Time: 06/19/17  1:05 PM  Result Value Ref Range Status   Specimen Description BRONCHIAL ALVEOLAR LAVAGE  Final   Special Requests NONE  Final   Gram Stain   Final    ABUNDANT WBC PRESENT, PREDOMINANTLY PMN MODERATE GRAM VARIABLE ROD MODERATE GRAM POSITIVE COCCI IN PAIRS RARE BUDDING YEAST SEEN    Culture 20,000 COLONIES/mL PSEUDOMONAS AERUGINOSA (A)  Final   Report Status 06/21/2017 FINAL  Final   Organism ID, Bacteria PSEUDOMONAS AERUGINOSA (A)  Final      Susceptibility   Pseudomonas aeruginosa - MIC*    CEFTAZIDIME 4 SENSITIVE Sensitive     CIPROFLOXACIN <=0.25 SENSITIVE Sensitive     GENTAMICIN <=1 SENSITIVE Sensitive     IMIPENEM 1 SENSITIVE Sensitive     PIP/TAZO 8 SENSITIVE Sensitive     CEFEPIME 2 SENSITIVE Sensitive     * 20,000 COLONIES/mL PSEUDOMONAS AERUGINOSA  CSF culture with Stat gram stain     Status: None   Collection Time: 06/19/17  1:30 PM  Result  Value Ref Range Status   Specimen Description CSF  Final   Special Requests NONE  Final   Gram Stain   Final    CYTOSPIN SMEAR WBC PRESENT, PREDOMINANTLY MONONUCLEAR YEAST CRITICAL RESULT CALLED TO, READ BACK BY AND VERIFIED WITH: Jessie Foot RN AT 0345 ON [id number] BY SJW    Culture FEW CRYPTOCOCCUS NEOFORMANS  Final   Report Status  06/22/2017 FINAL  Final  Fungus Culture With Stain     Status: None (Preliminary result)   Collection Time: 06/19/17  1:30 PM  Result Value Ref Range Status   Fungus Stain Final report  Final    Comment: (NOTE) Performed At: [facility] [place], [place] [id number] Lindon Romp MD BE:[id number]    Fungus (Mycology) Culture PENDING  Incomplete   Fungal Source PENDING  Incomplete  Fungus Culture Result     Status: None   Collection Time: 06/19/17  1:30 PM  Result Value Ref Range Status   Result 1 Comment  Final    Comment: (NOTE) Fungal elements, such as arthroconidia, hyphal fragments, chlamydoconidia, observed. POSITIVE SMEAR REPORTED TO LESLEY B. AT 1115 ON 05/22/17 BY AM Performed At: [facility] [place], [place] [id number] Lindon Romp MD YM:[id number]     Lars Mage, [place] [facility] [phone] pager   [phone] cell 06/22/2017, 3:51 PM

## 2017-06-23 ENCOUNTER — Encounter (HOSPITAL_COMMUNITY): Payer: Self-pay | Admitting: Infectious Disease

## 2017-06-23 DIAGNOSIS — L03113 Cellulitis of right upper limb: Secondary | ICD-10-CM

## 2017-06-23 HISTORY — DX: Cellulitis of right upper limb: L03.113

## 2017-06-23 LAB — CBC WITH DIFFERENTIAL/PLATELET
BASOS PCT: 1 %
Basophils Absolute: 0 10*3/uL (ref 0.0–0.1)
Eosinophils Absolute: 0.2 10*3/uL (ref 0.0–0.7)
Eosinophils Relative: 9 %
HEMATOCRIT: 26.2 % — AB (ref 39.0–52.0)
HEMOGLOBIN: 8.7 g/dL — AB (ref 13.0–17.0)
LYMPHS ABS: 0.3 10*3/uL — AB (ref 0.7–4.0)
Lymphocytes Relative: 12 %
MCH: 26.5 pg (ref 26.0–34.0)
MCHC: 33.2 g/dL (ref 30.0–36.0)
MCV: 79.9 fL (ref 78.0–100.0)
MONOS PCT: 10 %
Monocytes Absolute: 0.2 10*3/uL (ref 0.1–1.0)
NEUTROS ABS: 1.5 10*3/uL — AB (ref 1.7–7.7)
NEUTROS PCT: 68 %
Platelets: 142 10*3/uL — ABNORMAL LOW (ref 150–400)
RBC: 3.28 MIL/uL — AB (ref 4.22–5.81)
RDW: 14 % (ref 11.5–15.5)
WBC: 2.1 10*3/uL — AB (ref 4.0–10.5)

## 2017-06-23 LAB — BASIC METABOLIC PANEL
ANION GAP: 5 (ref 5–15)
BUN: 6 mg/dL (ref 6–20)
CHLORIDE: 102 mmol/L (ref 101–111)
CO2: 24 mmol/L (ref 22–32)
Calcium: 8 mg/dL — ABNORMAL LOW (ref 8.9–10.3)
Creatinine, Ser: 0.53 mg/dL — ABNORMAL LOW (ref 0.61–1.24)
GFR calc non Af Amer: >60 mL/min (ref >60)
Glucose, Bld: 106 mg/dL — ABNORMAL HIGH (ref 65–99)
Potassium: 3.9 mmol/L (ref 3.5–5.1)
Sodium: 131 mmol/L — ABNORMAL LOW (ref 135–145)

## 2017-06-23 LAB — PHOSPHORUS: Phosphorus: 3.2 mg/dL (ref 2.5–4.6)

## 2017-06-23 LAB — GLUCOSE, CAPILLARY
GLUCOSE-CAPILLARY: 115 mg/dL — AB (ref 65–99)
GLUCOSE-CAPILLARY: 103 mg/dL — AB (ref 65–99)
Glucose-Capillary: 95 mg/dL (ref 65–99)
Glucose-Capillary: 134 mg/dL — ABNORMAL HIGH (ref 65–99)
Glucose-Capillary: 102 mg/dL — ABNORMAL HIGH (ref 65–99)

## 2017-06-23 LAB — MAGNESIUM: Magnesium: 1.9 mg/dL (ref 1.7–2.4)

## 2017-06-23 LAB — PATHOLOGIST SMEAR REVIEW

## 2017-06-23 MED ORDER — PANTOPRAZOLE SODIUM 40 MG PO PACK
40.0000 mg | PACK | Freq: Every day | ORAL | Status: DC
  Administered 2017-06-23 – 2017-07-02 (×10): 40 mg
  Filled 2017-06-23 (×10): qty 20

## 2017-06-23 MED ORDER — SENNOSIDES 8.8 MG/5ML PO SYRP
5.0000 mL | ORAL_SOLUTION | Freq: Two times a day (BID) | ORAL | Status: DC
  Administered 2017-06-23 – 2017-06-24 (×3): 5 mL
  Filled 2017-06-23 (×4): qty 5

## 2017-06-23 MED ORDER — BISACODYL 10 MG RE SUPP
10.0000 mg | Freq: Once | RECTAL | Status: AC
  Administered 2017-06-23: 10 mg via RECTAL
  Filled 2017-06-23: qty 1

## 2017-06-23 MED ORDER — MAGNESIUM SULFATE IN D5W 1-5 GM/100ML-% IV SOLN
1.0000 g | Freq: Once | INTRAVENOUS | Status: AC
  Administered 2017-06-23: 1 g via INTRAVENOUS
  Filled 2017-06-23: qty 100

## 2017-06-23 MED ORDER — HEPARIN SODIUM (PORCINE) 5000 UNIT/ML IJ SOLN
5000.0000 [IU] | Freq: Three times a day (TID) | INTRAMUSCULAR | Status: DC
  Administered 2017-06-23 – 2017-07-10 (×48): 5000 [IU] via SUBCUTANEOUS
  Filled 2017-06-23 (×48): qty 1

## 2017-06-23 MED ORDER — ORITAVANCIN DIPHOSPHATE 400 MG IV SOLR: 1200.0000 mg | Freq: Once | INTRAVENOUS | Status: DC

## 2017-06-23 NOTE — Progress Notes (Signed)
      INFECTIOUS DISEASE ATTENDING ADDENDUM:   Date: 06/23/2017  Patient name: Kenneth Rivas  Medical record number: [id number]  Date of birth: [birth year]    This patient has been seen and discussed with the house staff. Please see the resident's note for complete details. I concur with their findings with the following additions/corrections:  I examined Kenneth Rivas early this morning on rounds. He was alert and oriented and answering questions to denied having headaches or problems with his vision. He was receiving oral care from nursing.  Reviewing his recent lumbar puncture I'm encouraged this opening pressure was again not high and happy that interventional radiology were able to remove 28 mL of spinal fluid.  CSF profile is been reviewed and CSF glucose is a bit disconcerting that is still low.  We will continue with AmBisome and flucytosine monitoring for toxicity and replete electrolytes.  We will finish out a course of anti-pseudomonal antibacterials for the Pseudomonas isolated from the lungs. I expect most of his pulmonary pathology is still due to cryptococcus which is in the lungs blood and central nervous system.  I THINK HE SHOULD HAVE ANOTHER LP WITH DOCUMENTED OPENING PRESSURE AND REMOVAL OF 25-30 CC CSF ON Thursday OR Friday CERTAINLY IF HE DEVELOPS HEADACHE THEN SOONER  ARV will need to be delayed 5 weeks into rx  He is severely malnourished and nutritional need to be optimized I anticipate that he will need to be placed into either long-term care or skilled nursing facility given how ill he is.  I spent greater than 35 minutes with the patient including greater than 50% of time in face to face counsel of the patient and in coordination of his care with CCM.   Paulette BlanchCornelius Van Dam 06/23/2017, 10:25 AM

## 2017-06-23 NOTE — Progress Notes (Signed)
Replenishing a magnesium level of 1.9 with 1 gram of magnesium sulfate IV per amphotericin B protocol. Potassium does not meet criteria for replacement at this time with a level of 3.9.  Nolen MuAustin J Lucas PharmD PGY1 Pharmacy Practice Resident 06/23/2017 6:30 AM Pager: [phone]

## 2017-06-23 NOTE — Progress Notes (Signed)
PULMONARY / CRITICAL CARE MEDICINE   Name: Kenneth Rivas MRN: [id number] DOB: Apr 12, 1961    ADMISSION DATE:  06/16/2017   CONSULTATION DATE:  06/19/2017  REFERRING MD:  Drucilla Schmidt   CHIEF COMPLAINT:   Respiratory distress and altered mental status   HISTORY OF PRESENT ILLNESS:  56 y.o. 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 Admitted on 7/31 w/ cc: weakness and FTT. Admitting dx: FTT and cachexia d/t AIDs, pulmonary nodules, bedbugs, and HTn. Had CT chest showing cavitary lung lesion in the RML. ID was consulted and ordered serum crypto, this was positive. The patient was complaining of headache. An attempt to have LP on 8/2 was unsuccessful due to what was felt to be positional hypoxia during prone positioning. PCCM was asked to see him on 8/3 for formal consult w/ request to obtain LP as well as bronchoscopy/ BAL for AFB. As active TB and or cryptococcal infection would be contra-indication to resuming ARVs acutely   Subjective:  No acute events overnight. Patient still with no respiratory effort on SBT. Nods no to any pain or difficulty breathing.   Review of Systems:  Unable to obtain with intubation.   VITAL SIGNS: BP (!) 141/75   Pulse 81   Temp 98.5 F (36.9 C) (Oral)   Resp 13   Ht _0  (1.676 m)   Wt 112 lb 3.4 oz (50.9 kg)   SpO2 100%   BMI 18.11 kg/m   HEMODYNAMICS:    VENTILATOR SETTINGS: Vent Mode: PRVC FiO2 (%):  [40 %] 40 % Set Rate:  [12 bmp] 12 bmp Vt Set:  [500 mL] 500 mL PEEP:  [5 cmH20] 5 cmH20 Plateau Pressure:  [13 cmH20-17 cmH20] 17 cmH20  INTAKE / OUTPUT: I/O last 3 completed shifts: In: 6858 [I.V.:3080; Other:20; NG/GT:1308; IV HENIDPOEU:2353] Out: 6144 [Urine:5885]   General:  Awake. No distress. No family at bedside.  Integument:  Warm. Dry. No rash on exposed skin.  HEENT:  Endotracheal tube in place. No scleral icterus.  Neurological:  Moving all 4  extremities equally. Nods to questions. Grossly nonfocal.  Pulmonary:  Clear to auscultation. Normal work of breathing on ventilator.  Cardiovascular:  Regular rate. Regular rhythm. No edema. Abdomen:  Soft. Normal bowel sounds. Nondistended.   LABS:  BMET  Recent Labs Lab 06/22/17 0548 06/22/17 1643 06/23/17 0230  NA 130* 130* 131*  K 3.0* 4.4 3.9  CL 98* 101 102  CO2 23 20* 24  BUN 5* 6 6  CREATININE 0.64 0.56* 0.53*  GLUCOSE 104* 102* 106*    Electrolytes  Recent Labs Lab 06/22/17 0548 06/22/17 1643 06/23/17 0230  CALCIUM 8.0* 8.0* 8.0*  MG 1.7 2.0 1.9  PHOS 3.1 3.1 3.2    CBC  Recent Labs Lab 06/21/17 0226 06/22/17 0548 06/23/17 0230  WBC 4.1 2.9* 2.1*  HGB 8.7* 9.2* 8.7*  HCT 26.3* 27.6* 26.2*  PLT 92* 133* 142*    Coag's No results for input(s): APTT, INR in the last 168 hours.  Sepsis Markers  Recent Labs Lab 06/16/17 1409 06/16/17 1924  LATICACIDVEN 2.90* 2.03*    ABG  Recent Labs Lab 06/19/17 2052 06/20/17 0515  PHART 7.549* 7.434  PCO2ART 37.7 39.9  PO2ART 503.0* 163*    Liver Enzymes  Recent Labs Lab 06/16/17 1915  AST 19  ALT 13*  ALKPHOS 58  BILITOT 0.8  ALBUMIN 3.1*    Cardiac Enzymes No results for input(s):  TROPONINI, PROBNP in the last 168 hours.  Glucose  Recent Labs Lab 06/22/17 1123 06/22/17 1505 06/22/17 1941 06/22/17 2340 06/23/17 0327 06/23/17 0735  GLUCAP 123* 109* 85 105* 95 115*    Imaging Dg Fluoro Guide Lumbar Puncture  Result Date: 06/22/2017 CLINICAL DATA:  Cryptococcal meningitis. EXAM: DIAGNOSTIC LUMBAR PUNCTURE UNDER FLUOROSCOPIC GUIDANCE FLUOROSCOPY TIME:  Fluoroscopy Time:  0 minutes 6 seconds PROCEDURE: Informed consent was obtained from the patient's sister by phone prior to the procedure, including potential complications of headache, allergy, and pain. With the patient left lateral decubitus, the lower back was prepped with Betadine. 1% Lidocaine was used for local anesthesia.  Lumbar puncture was performed at the L4-5 level using a 20 gauge needle with return of clear colorless CSF with an opening pressure of 15 cm water. 28 ml of CSF were obtained for laboratory studies. Closing pressure was 6 cm of water. The patient tolerated the procedure well and there were no apparent complications. IMPRESSION: Lumbar puncture performed without complication. Electronically Signed   By: Lorriane Shire M.D.   On: 06/22/2017 16:58     IMAGING/STUDIES: UDS 7/26:  Cocaine Positive UDS 7/31:  Cocaine Positive  CT CHEST W/ 8/1:  7 mm cavitary lesion is seen in the right middle lobe. It is uncertain if this represents cavitary metastatic lesion or small pulmonary abscess. Clinical correlation is recommended. Follow-up CT scan in 6-8 weeks is recommended to determine stability or resolution. CT HEAD W/O 8/2:  Progression of generalized atrophy which may be due to HIV infection. Interval development of diffuse white matter disease in the periventricular white matter with focal hypodensity left inferior frontal lobe. This could be due to chronic ischemia however in the setting of HIV also consider infection and less likely tumor. Follow-up MRI of the brain with contrast suggested. PORT CXR 8/3:  Previously reviewed by me. Endotracheal tube in good position. Enteric feeding tube going below diaphragm. No focal opacity.   MICROBIOLOGY: Quantiferon- TB 8/1:  Indeterminate Sputum AFB Culture 8/1 >>> (smear negative) SPutum AFB Culture 8/2 >>> (smear negative) Sputum Culture 8/2:  Negative Cryptococcal Ag Titer 8/2:  >1:2560  MRSA PCR 8/3:  Negative  Blood Cultures x2 8/2 >>> 2/2 Positive Budding Yeast BAL 8/3 >>>  Pseudomonas aeruginosa / C albican / AFB pending (smear negative) Cryptococcal Ag Titer 8/3:  >1:2560 CSF Culture 8/3 >>> Fungal Elements ABG Culture 8/5 >>> pending (smear negative) CSF Culture 8/6 >>>  ANTIBIOTICS: Vancomycin 8/3 - 8/5 Zosyn 8/3 - 8/5 Amphotericin B 8/2  >>> Flucytosine 8/2 >>> Fortaz 8/5 >>>  SIGNIFICANT EVENTS: 07/31 - Admit 08/03 - Bronchoscopy by DF w/ purulent secretions. 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 Mental status slowly improving. Right middle lobe cavitary nodule likely secondary to cryptococcal infection.   ASSESSMENT / PLAN:  PULMONARY A: Acute hypoxic respiratory failure: Secondary to inability to protect airway. Improving. Right middle lobe cavitary nodule: Likely secondary to cryptococcal infection. Pseudomonas pneumonia  P:   Continuing full ventilator support Daily pressures were weaning and spontaneous breathing trial   CARDIOVASCULAR A:  Essential hypertension: Blood pressure controlled.   P:  Continue telemetry monitoring Monitoring vitals per unit protocol Continue Norvasc Hydralazine IV when necessary   RENAL A:   Hyponatremia: Improving. Hypomagnesemia: Resolved. Hypokalemia: Resolved.   P:   Trending urine output with Foley Monitoring renal function and electrolytes daily   GASTROINTESTINAL A:   Severe protein calorie malnutrition: Patient also has failure to thrive.  Constipation  P:   Starting Senna via tube BID Dulcolax Suppository x1 Continuing tube feedings Continuing Protonix via tube daily  HEMATOLOGIC A:   Anemia: Secondary to anemia of chronic disease. No signs of active bleeding. Thrombocytopenia: Resolving. Likely secondary to sepsis.   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 Pseudomonas pneumonia  Cavitary right middle lobe nodule: Likely secondary to cryptococcal infection. AIDS Hepatitis B viral infection   P:   Continuing antibiotics as above ID following and guiding antibiotics Awaiting finalization of cultures and infectious workup   NEUROLOGIC A:   Cryptococcal meningitis Increased CSF/intracranial pressure: Improving. Undergoing serial lumbar punctures. INR & neurosurgery refused to place lumbar drain.   P:   Desired RASS:  0 to -1 Plan for repeat lumbar puncture on Thursday - Goal Closing Pressure <18 Intermittent sedation   Endocrine A: No acute issues.  P: Monitoring glucose with labs daily.   FAMILY  - Updates:  Sisters updated 8/6 at bedside by Dr. Ashok Cordia. No family at bedside 8/7.   - Inter-disciplinary family meet or Palliative Care meeting due by: 8/8  I have spent a total of 32 minutes of critical care time today caring for the patient and reviewing the patient's electronic medical record.    Sonia Baller Ashok Cordia, M.D. [facility] Pulmonary & Critical Care Pager:  [phone] After 3pm or if no response, call [phone] 06/23/2017  10:59 AM

## 2017-06-23 NOTE — Care Management Note (Signed)
Case Management Note  Patient Details  Name: Kenneth Rivas MRN: [id number] Date of Birth: Jun 29, 1961  Subjective/Objective:           Pt admitted with  cryptococcal meningitis and pneumonia, active TB   Action/Plan:   PTA independent from rooming home (admitted with bed bugs).  Pt is on ventilator and had an additional LP performed on yesterday.  Family support provided by sister.   SNF recommended - CSW actively following for placement   Expected Discharge Date:  06/19/17               Expected Discharge Plan:     In-House Referral:  Clinical Social Work  Discharge planning Services  CM Consult  Post Acute Care Choice:    Choice offered to:     DME Arranged:    DME Agency:     HH Arranged:    HH Agency:     Status of Service:     If discussed at [employer] Meetings, dates discussed:    Additional Comments:  Cherylann ParrClaxton, Kenneth Elmore S, RN 06/23/2017, 11:45 AM

## 2017-06-23 NOTE — Progress Notes (Signed)
[facility] for Infectious Disease  Date of Admission:  06/16/2017   Total days of antibiotics 6        Day 5 amphotericin/flucytosine        Day 2 ceftazidime        ASSESSMENT and PLAN:  Disseminated cryptococcal disease- Meningitis and Pneumonia:  CSF results show wbc=18, protein=140, glu=29 which is consistent with cryptococcal meningitis. Still continues to have low glucose and elevated protein.  -Repeat LP on 8/9 or 8/10 LP yesterday 06/22/17 revealed an op=15 cm h20, 60m of csf were able to be removed. Patient's opening pressure seems to be decreasing well with serial lumbar puncture. If not lowered, consider evdrain. -Continue induction therapy with Amphotericin and flucytosine to treat cryptococcal meningitis and pneumonia for 2 weeks. Will likely follow with consolidation therapy with fluconzole qd for 8 weeks.  -Creatinine=0.53  -Monitor for worsening anemia, leukopenia, thrombocytopenia -Continue ceftazidime to cover for pseudomonas aeruginosa    HIV/AIDS - Will not start antiretroviral therapy due to risk of IRIS. Will consider in 5 weeks. -Continue azithromycin and bactrim as patient is on ART   . acetaminophen (TYLENOL) oral liquid 160 mg/5 mL  650 mg Per Tube Q6H  . amLODipine  5 mg Per Tube Daily  . azithromycin  1,200 mg Per Tube Weekly  . bisacodyl  10 mg Rectal Once  . chlorhexidine gluconate (MEDLINE KIT)  15 mL Mouth Rinse BID  . dextrose  10 mL Intravenous Q24H  . dextrose  10 mL Intravenous Q24H  . flucytosine  1,250 mg Per Tube Q6H  . mouth rinse  15 mL Mouth Rinse QID  . pantoprazole sodium  40 mg Per Tube Daily  . sennosides  5 mL Per Tube BID  . sulfamethoxazole-trimethoprim  1 tablet Per Tube Daily    SUBJECTIVE: Mr. DGoldiewas seen laying in his bed this morning. He denied any headaches, sob, or chest pain.  Review of Systems: ROS as above   No Active Allergies  OBJECTIVE: Vitals:   06/23/17 0900 06/23/17 0916 06/23/17  1000 06/23/17 1100  BP: 136/88 136/88 (!) 141/75 138/73  Pulse: 80  81 (!) 103  Resp: 15  13 (!) 23  Temp:      TempSrc:      SpO2: 100%  100% 100%  Weight:      Height:       Body mass index is 18.11 kg/m.  Physical Exam  HENT:  Head: Normocephalic and atraumatic.  Cardiovascular: Normal rate, regular rhythm, normal heart sounds and intact distal pulses.   Pulmonary/Chest: Breath sounds normal.  Abdominal: He exhibits no distension. There is no tenderness.  Skin: No rash noted. No erythema.    Lab Results Lab Results  Component Value Date   WBC 2.1 (L) 06/23/2017   HGB 8.7 (L) 06/23/2017   HCT 26.2 (L) 06/23/2017   MCV 79.9 06/23/2017   PLT 142 (L) 06/23/2017    Lab Results  Component Value Date   CREATININE 0.53 (L) 06/23/2017   BUN 6 06/23/2017   NA 131 (L) 06/23/2017   K 3.9 06/23/2017   CL 102 06/23/2017   CO2 24 06/23/2017    Lab Results  Component Value Date   ALT 13 (L) 06/16/2017   AST 19 06/16/2017   ALKPHOS 58 06/16/2017   BILITOT 0.8 06/16/2017     Microbiology: Recent Results (from the past 240 hour(s))  Acid Fast Smear (AFB)     Status:  None   Collection Time: 06/17/17 11:29 PM  Result Value Ref Range Status   AFB Specimen Processing Concentration  Final   Acid Fast Smear Negative  Final    Comment: (NOTE) Performed At: [facility] [address] [place], [place] [id number] Lindon Romp MD WU:[id number]    Source (AFB) SPUTUM  Final  Culture, expectorated sputum-assessment     Status: None   Collection Time: 06/18/17  5:00 AM  Result Value Ref Range Status   Specimen Description SPUTUM  Final   Special Requests Immunocompromised  Final   Sputum evaluation   Final    Sputum specimen not acceptable for testing.  Please recollect.   Gram Stain Report Called to,Read Back By and Verified With: RN Anna Genre [phone] MLM    Report Status 06/18/2017 FINAL  Final  Acid Fast Smear (AFB)     Status: None   Collection Time:  06/18/17  5:00 AM  Result Value Ref Range Status   AFB Specimen Processing Concentration  Final   Acid Fast Smear Negative  Final    Comment: (NOTE) Performed At: [facility] [place], [place] [id number] Lindon Romp MD [id number]    Source (AFB) SPUTUM  Final  Culture, blood (Routine X 2) w Reflex to ID Panel     Status: None (Preliminary result)   Collection Time: 06/18/17  9:35 AM  Result Value Ref Range Status   Specimen Description BLOOD LEFT HAND  Final   Special Requests   Final    BOTTLES DRAWN AEROBIC ONLY Blood Culture results may not be optimal due to an inadequate volume of blood received in culture bottles   Culture  Setup Time   Final    BUDDING YEAST SEEN AEROBIC BOTTLE ONLY CRITICAL RESULT CALLED TO, READ BACK BY AND VERIFIED WITH: N JOHNSTON 06/22/17 @ 71 M VESTAL    Culture YEAST CULTURE REINCUBATED FOR BETTER GROWTH   Final   Report Status PENDING  Incomplete  Blood Culture ID Panel (Reflexed)     Status: None   Collection Time: 06/18/17  9:35 AM  Result Value Ref Range Status   Enterococcus species NOT DETECTED NOT DETECTED Final   Listeria monocytogenes NOT DETECTED NOT DETECTED Final   Staphylococcus species NOT DETECTED NOT DETECTED Final   Staphylococcus aureus NOT DETECTED NOT DETECTED Final   Streptococcus species NOT DETECTED NOT DETECTED Final   Streptococcus agalactiae NOT DETECTED NOT DETECTED Final   Streptococcus pneumoniae NOT DETECTED NOT DETECTED Final   Streptococcus pyogenes NOT DETECTED NOT DETECTED Final   Acinetobacter baumannii NOT DETECTED NOT DETECTED Final   Enterobacteriaceae species NOT DETECTED NOT DETECTED Final   Enterobacter cloacae complex NOT DETECTED NOT DETECTED Final   Escherichia coli NOT DETECTED NOT DETECTED Final   Klebsiella oxytoca NOT DETECTED NOT DETECTED Final   Klebsiella pneumoniae NOT DETECTED NOT DETECTED Final   Proteus species NOT DETECTED NOT DETECTED Final   Serratia  marcescens NOT DETECTED NOT DETECTED Final   Haemophilus influenzae NOT DETECTED NOT DETECTED Final   Neisseria meningitidis NOT DETECTED NOT DETECTED Final   Pseudomonas aeruginosa NOT DETECTED NOT DETECTED Final   Candida albicans NOT DETECTED NOT DETECTED Final   Candida glabrata NOT DETECTED NOT DETECTED Final   Candida krusei NOT DETECTED NOT DETECTED Final   Candida parapsilosis NOT DETECTED NOT DETECTED Final   Candida tropicalis NOT DETECTED NOT DETECTED Final  Culture, blood (Routine X 2) w Reflex to ID Panel  Status: None (Preliminary result)   Collection Time: 06/18/17  9:36 AM  Result Value Ref Range Status   Specimen Description BLOOD LEFT ARM  Final   Special Requests   Final    BOTTLES DRAWN AEROBIC ONLY Blood Culture results may not be optimal due to an inadequate volume of blood received in culture bottles   Culture  Setup Time   Final    BUDDING YEAST SEEN AEROBIC BOTTLE ONLY CRITICAL RESULT CALLED TO, READ BACK BY AND VERIFIED WITH: N JOHNSTON 06/22/17 @ 25 M VESTAL    Culture YEAST CULTURE REINCUBATED FOR BETTER GROWTH   Final   Report Status PENDING  Incomplete  MRSA PCR Screening     Status: None   Collection Time: 06/19/17 12:18 PM  Result Value Ref Range Status   MRSA by PCR NEGATIVE NEGATIVE Final    Comment:        The GeneXpert MRSA Assay (FDA approved for NASAL specimens only), is one component of a comprehensive MRSA colonization surveillance program. It is not intended to diagnose MRSA infection nor to guide or monitor treatment for MRSA infections.   Acid Fast Smear (AFB)     Status: None   Collection Time: 06/19/17  1:05 PM  Result Value Ref Range Status   AFB Specimen Processing Concentration  Final   Acid Fast Smear Negative  Final    Comment: (NOTE) Performed At: [facility] [place], [place] [id number] Lindon Romp MD KG:[id number]    Source (AFB) BRONCHIAL ALVEOLAR LAVAGE  Final  Acid Fast Smear (AFB)      Status: None   Collection Time: 06/19/17  1:05 PM  Result Value Ref Range Status   AFB Specimen Processing Concentration  Final   Acid Fast Smear Negative  Final    Comment: (NOTE) Performed At: [facility] [place], [place] [id number] Lindon Romp MD VZ:[id number]    Source (AFB) BRONCHIAL ALVEOLAR LAVAGE  Final  Culture, fungus without smear     Status: Abnormal (Preliminary result)   Collection Time: 06/19/17  1:05 PM  Result Value Ref Range Status   Specimen Description BRONCHIAL ALVEOLAR LAVAGE  Final   Special Requests Immunocompromised  Final   Culture CANDIDA ALBICANS (A)  Final   Report Status PENDING  Incomplete  Pneumocystis smear by DFA     Status: None   Collection Time: 06/19/17  1:05 PM  Result Value Ref Range Status   Specimen Source-PJSRC BRONCHIAL ALVEOLAR LAVAGE  Final   Pneumocystis jiroveci Ag NEGATIVE  Final    Comment: Performed at [place] [facility]  Culture, bal-quantitative     Status: Abnormal   Collection Time: 06/19/17  1:05 PM  Result Value Ref Range Status   Specimen Description BRONCHIAL ALVEOLAR LAVAGE  Final   Special Requests NONE  Final   Gram Stain   Final    ABUNDANT WBC PRESENT, PREDOMINANTLY PMN MODERATE GRAM VARIABLE ROD MODERATE GRAM POSITIVE COCCI IN PAIRS RARE BUDDING YEAST SEEN    Culture 20,000 COLONIES/mL PSEUDOMONAS AERUGINOSA (A)  Final   Report Status 06/21/2017 FINAL  Final   Organism ID, Bacteria PSEUDOMONAS AERUGINOSA (A)  Final      Susceptibility   Pseudomonas aeruginosa - MIC*    CEFTAZIDIME 4 SENSITIVE Sensitive     CIPROFLOXACIN <=0.25 SENSITIVE Sensitive     GENTAMICIN <=1 SENSITIVE Sensitive     IMIPENEM 1 SENSITIVE Sensitive     PIP/TAZO 8 SENSITIVE Sensitive  CEFEPIME 2 SENSITIVE Sensitive     * 20,000 COLONIES/mL PSEUDOMONAS AERUGINOSA  CSF culture with Stat gram stain     Status: None   Collection Time: 06/19/17  1:30 PM  Result Value Ref Range Status   Specimen  Description CSF  Final   Special Requests NONE  Final   Gram Stain   Final    CYTOSPIN SMEAR WBC PRESENT, PREDOMINANTLY MONONUCLEAR YEAST CRITICAL RESULT CALLED TO, READ BACK BY AND VERIFIED WITH: Jessie Foot RN AT 0345 ON [id number] BY SJW    Culture FEW CRYPTOCOCCUS NEOFORMANS  Final   Report Status 06/22/2017 FINAL  Final  Fungus Culture With Stain     Status: None (Preliminary result)   Collection Time: 06/19/17  1:30 PM  Result Value Ref Range Status   Fungus Stain Final report  Final    Comment: (NOTE) Performed At: [name] [place], [place] [id number] Lindon Romp MD EM:[id number]    Fungus (Mycology) Culture PENDING  Incomplete   Fungal Source PENDING  Incomplete  Fungus Culture Result     Status: None   Collection Time: 06/19/17  1:30 PM  Result Value Ref Range Status   Result 1 Comment  Final    Comment: (NOTE) Fungal elements, such as arthroconidia, hyphal fragments, chlamydoconidia, observed. POSITIVE SMEAR REPORTED TO LESLEY B. AT 1115 ON 05/22/17 BY AM Performed At: [facility] [place], [place] [id number] Lindon Romp MD [id number]   Acid Fast Smear (AFB)     Status: None   Collection Time: 06/21/17 12:12 PM  Result Value Ref Range Status   AFB Specimen Processing Concentration  Final   Acid Fast Smear Negative  Final    Comment: (NOTE) Performed At: [facility] [place], [place] [id number] Lindon Romp MD [id number]:[id number]    Source (AFB) TRACHEAL ASPIRATE  Final  CSF culture     Status: None (Preliminary result)   Collection Time: 06/22/17  4:25 PM  Result Value Ref Range Status   Specimen Description CSF  Final   Special Requests NONE  Final   Gram Stain   Final    CYTOSPIN SMEAR WBC PRESENT,BOTH PMN AND MONONUCLEAR ENCAPSULATED YEAST SEEN CRITICAL RESULT CALLED TO, READ BACK BY AND VERIFIED WITH: Heath Lark RN 17:10 06/22/17 ([email or web address])    Culture NO GROWTH < 24 HOURS  Final     Report Status PENDING  Incomplete    Lars Mage, MD [facility] [phone] pager   [phone] cell 06/23/2017, 11:38 AM

## 2017-06-23 NOTE — Progress Notes (Signed)
Saw and examined [name] this morning. He has been taken off of airborne precautions due to negative AFB smears x 4.  Being treated for cryptococcal meningitis and pneumonia.  Will be working toward weaning off of ventilator today.  He was responsive to my questions and denied pain.  We appreciated the excellent care provided by the CCM and will be happy to resume care once he is transferred to our service.

## 2017-06-24 ENCOUNTER — Inpatient Hospital Stay (HOSPITAL_COMMUNITY): Payer: Medicaid Other

## 2017-06-24 LAB — CULTURE, BLOOD (ROUTINE X 2)

## 2017-06-24 LAB — CSF CELL COUNT WITH DIFFERENTIAL
EOS CSF: 0 % (ref 0–1)
Eosinophils, CSF: 0 % (ref 0–1)
LYMPHS CSF: 90 % — AB (ref 40–80)
LYMPHS CSF: 94 % — AB (ref 40–80)
MONOCYTE-MACROPHAGE-SPINAL FLUID: 5 % — AB (ref 15–45)
MONOCYTE-MACROPHAGE-SPINAL FLUID: 9 % — AB (ref 15–45)
RBC COUNT CSF: 1 /mm3 — AB
RBC COUNT CSF: 2 /mm3 — AB
Segmented Neutrophils-CSF: 1 % (ref 0–6)
Segmented Neutrophils-CSF: 1 % (ref 0–6)
TUBE #: 1
TUBE #: 4
WBC, CSF: 28 /mm3 (ref 0–5)
WBC, CSF: 37 /mm3 (ref 0–5)

## 2017-06-24 LAB — CBC WITH DIFFERENTIAL/PLATELET
BASOS ABS: 0 10*3/uL (ref 0.0–0.1)
Basophils Relative: 1 %
Eosinophils Absolute: 0.2 10*3/uL (ref 0.0–0.7)
Eosinophils Relative: 10 %
HEMATOCRIT: 27 % — AB (ref 39.0–52.0)
HEMOGLOBIN: 9.1 g/dL — AB (ref 13.0–17.0)
LYMPHS PCT: 23 %
Lymphs Abs: 0.3 10*3/uL — ABNORMAL LOW (ref 0.7–4.0)
MCH: 27.2 pg (ref 26.0–34.0)
MCHC: 33.7 g/dL (ref 30.0–36.0)
MCV: 80.6 fL (ref 78.0–100.0)
MONOS PCT: 16 %
Monocytes Absolute: 0.2 10*3/uL (ref 0.1–1.0)
NEUTROS ABS: 0.8 10*3/uL — AB (ref 1.7–7.7)
NEUTROS PCT: 50 %
Platelets: 180 10*3/uL (ref 150–400)
RBC: 3.35 MIL/uL — ABNORMAL LOW (ref 4.22–5.81)
RDW: 14.5 % (ref 11.5–15.5)
WBC: 1.5 10*3/uL — ABNORMAL LOW (ref 4.0–10.5)

## 2017-06-24 LAB — GLUCOSE, CAPILLARY
GLUCOSE-CAPILLARY: 100 mg/dL — AB (ref 65–99)
GLUCOSE-CAPILLARY: 112 mg/dL — AB (ref 65–99)
GLUCOSE-CAPILLARY: 84 mg/dL (ref 65–99)
Glucose-Capillary: 117 mg/dL — ABNORMAL HIGH (ref 65–99)
Glucose-Capillary: 118 mg/dL — ABNORMAL HIGH (ref 65–99)
Glucose-Capillary: 88 mg/dL (ref 65–99)
Glucose-Capillary: 89 mg/dL (ref 65–99)

## 2017-06-24 LAB — BASIC METABOLIC PANEL
ANION GAP: 5 (ref 5–15)
BUN: 6 mg/dL (ref 6–20)
CHLORIDE: 100 mmol/L — AB (ref 101–111)
CO2: 26 mmol/L (ref 22–32)
Calcium: 8.4 mg/dL — ABNORMAL LOW (ref 8.9–10.3)
Creatinine, Ser: 0.53 mg/dL — ABNORMAL LOW (ref 0.61–1.24)
GFR calc non Af Amer: >60 mL/min (ref >60)
GLUCOSE: 102 mg/dL — AB (ref 65–99)
POTASSIUM: 3.6 mmol/L (ref 3.5–5.1)
Sodium: 131 mmol/L — ABNORMAL LOW (ref 135–145)

## 2017-06-24 LAB — PROTEIN, CSF: Total  Protein, CSF: 125 mg/dL — ABNORMAL HIGH (ref 15–45)

## 2017-06-24 LAB — GLUCOSE, CSF: Glucose, CSF: 32 mg/dL — ABNORMAL LOW (ref 40–70)

## 2017-06-24 LAB — CRYPTOCOCCAL ANTIGEN, CSF
CRYPTO AG: POSITIVE — AB
CRYPTOCOCCAL AG TITER: 2560 — AB

## 2017-06-24 LAB — PATHOLOGIST SMEAR REVIEW: Path Review: NEGATIVE

## 2017-06-24 LAB — PHOSPHORUS: PHOSPHORUS: 3.2 mg/dL (ref 2.5–4.6)

## 2017-06-24 LAB — MAGNESIUM: Magnesium: 1.7 mg/dL (ref 1.7–2.4)

## 2017-06-24 MED ORDER — POTASSIUM CHLORIDE 20 MEQ/15ML (10%) PO SOLN
20.0000 meq | ORAL | Status: AC
  Administered 2017-06-24 (×2): 20 meq
  Filled 2017-06-24 (×2): qty 15

## 2017-06-24 MED ORDER — LIDOCAINE HCL (PF) 1 % IJ SOLN
INTRAMUSCULAR | Status: AC
  Administered 2017-06-24: 17:00:00
  Filled 2017-06-24: qty 10

## 2017-06-24 MED ORDER — SENNOSIDES 8.8 MG/5ML PO SYRP
5.0000 mL | ORAL_SOLUTION | Freq: Every day | ORAL | Status: DC
  Administered 2017-06-26 – 2017-07-02 (×7): 5 mL
  Filled 2017-06-24 (×9): qty 5

## 2017-06-24 MED ORDER — NITROGLYCERIN 0.4 MG SL SUBL: 0.4000 mg | SUBLINGUAL_TABLET | SUBLINGUAL | Status: DC | PRN

## 2017-06-24 MED ORDER — MAGNESIUM SULFATE IN D5W 1-5 GM/100ML-% IV SOLN
1.0000 g | Freq: Once | INTRAVENOUS | Status: AC
  Administered 2017-06-24: 1 g via INTRAVENOUS
  Filled 2017-06-24: qty 100

## 2017-06-24 NOTE — Progress Notes (Signed)
eLink Physician-Brief Progress Note Patient Name: Kenneth Rivas DOB: [birth year] MRN: [id number]   Date of Service  06/24/2017  HPI/Events of Note  LP - CSF with yeast on microscopic exam - Patient is already on Liposomal Amphotericin B for Cryptococcal meningitis. ID is following the patient.   eICU Interventions  Will ask bedside nurse to let ID know about the LP results. I anticipate no change in therapy.      Intervention Category Major Interventions: Infection - evaluation and management  Emoree Sasaki Eugene 06/24/2017, 8:00 PM

## 2017-06-24 NOTE — Progress Notes (Signed)
CRITICAL VALUE ALERT  Critical Value:  CSF tube 1 WBC 37 tube 4 WBC 28  Date & Time Notied:  06/24/2017 20:20  Provider Notified: MD. Daiva EvesVan Dam, MD. Arsenio LoaderSommer  Orders Received/Actions taken: Will continue to monitor

## 2017-06-24 NOTE — Progress Notes (Signed)
PCCM Attending Note: Notified by ID attending of gaze and mental status change. This is different from my exam this morning. Ordering Stat LP & CT Head w/o.  Donna ChristenJennings E. Jamison NeighborNestor, M.D. [facility] Pager:  [phone] After 3pm or if no response, call [phone] 2:27 PM 06/24/17

## 2017-06-24 NOTE — Progress Notes (Signed)
[facility]   Date of Admission:  06/16/2017   Total days of antibiotics 7        Day 6 amphotericin/flucytosine        Day 3 ceftazidime         ASSESSMENT and PLAN:  Disseminated cryptococcal disease- Meningitis and Pneumonia:  -Repeat LP tomorrow 8/9. Speculation for increased cvp due to patients physical exam. If pt's pressures are not lowered adequately may have to consider a evdrain.  Previous LP 06/22/17 revealed an op=15 cm h20, 62m of csf were able to be removed.  -Ordered CT head -Continue induction therapy with Amphotericin to treat cryptococcal meningitis and pneumonia for 2 weeks. Will follow with consolidation therapy with fluconzole qd for 8 weeks.             -Renal function remains unchanged              -Leukopenia continues to worsen from 2.1 to 1.5 today (8/8). Hb=9.1,  platelets=180. -Discontinue flucytosine due to leukopenia  -Continue ceftazidime to cover for pseudomonas aeruginosa              HIV/AIDS - Will not start antiretroviral therapy due to risk of IRIS. Will consider in 5 weeks. -Continue azithromycin and bactrim as patient is on ART    . acetaminophen (TYLENOL) oral liquid 160 mg/5 mL  650 mg Per Tube Q6H  . amLODipine  5 mg Per Tube Daily  . azithromycin  1,200 mg Per Tube Weekly  . chlorhexidine gluconate (MEDLINE KIT)  15 mL Mouth Rinse BID  . dextrose  10 mL Intravenous Q24H  . dextrose  10 mL Intravenous Q24H  . heparin subcutaneous  5,000 Units Subcutaneous Q8H  . mouth rinse  15 mL Mouth Rinse QID  . pantoprazole sodium  40 mg Per Tube Daily  . [START ON 06/25/2017] sennosides  5 mL Per Tube QHS  . sulfamethoxazole-trimethoprim  1 tablet Per Tube Daily    SUBJECTIVE: Mr. ALumirwas seen laying in his bed. He was responsive to questions nonverbally. He denied any sob, chest pain, or headaches.   Review of Systems: ROS as above   No Active Allergies  OBJECTIVE: Vitals:   06/24/17 1500  06/24/17 1542 06/24/17 1600 06/24/17 1700  BP: 128/89 128/84 (!) 135/96 129/86  Pulse: 90 96 81 73  Resp:  12  16  Temp:      TempSrc:      SpO2: 100% 98% 100% 100%  Weight:      Height:       Body mass index is 18.4 kg/m.  Physical Exam  Constitutional: He appears unhealthy. He appears cachectic.  HENT:  Head: Normocephalic and atraumatic.  Eyes:  Left eyelid was drooping and left eye was deviating   Cardiovascular: Normal rate, regular rhythm, normal heart sounds and intact distal pulses.   Pulmonary/Chest: He is in respiratory distress.  Skin: No rash noted.    Lab Results Lab Results  Component Value Date   WBC 1.5 (L) 06/24/2017   HGB 9.1 (L) 06/24/2017   HCT 27.0 (L) 06/24/2017   MCV 80.6 06/24/2017   PLT 180 06/24/2017    Lab Results  Component Value Date   CREATININE 0.53 (L) 06/24/2017   BUN 6 06/24/2017   NA 131 (L) 06/24/2017   K 3.6 06/24/2017   CL 100 (L) 06/24/2017   CO2 26 06/24/2017    Lab Results  Component Value  Date   ALT 13 (L) 06/16/2017   AST 19 06/16/2017   ALKPHOS 58 06/16/2017   BILITOT 0.8 06/16/2017     Microbiology: Recent Results (from the past 240 hour(s))  Acid Fast Smear (AFB)     Status: None   Collection Time: 06/17/17 11:29 PM  Result Value Ref Range Status   AFB Specimen Processing Concentration  Final   Acid Fast Smear Negative  Final    Comment: (NOTE) Performed At: [facility] [address] [place], [place] [id number] Lindon Romp MD [id number]    Source (AFB) SPUTUM  Final  Culture, expectorated sputum-assessment     Status: None   Collection Time: 06/18/17  5:00 AM  Result Value Ref Range Status   Specimen Description SPUTUM  Final   Special Requests Immunocompromised  Final   Sputum evaluation   Final    Sputum specimen not acceptable for testing.  Please recollect.   Gram Stain Report Called to,Read Back By and Verified With: RN Anna Genre [phone] MLM    Report Status 06/18/2017 FINAL   Final  Acid Fast Smear (AFB)     Status: None   Collection Time: 06/18/17  5:00 AM  Result Value Ref Range Status   AFB Specimen Processing Concentration  Final   Acid Fast Smear Negative  Final    Comment: (NOTE) Performed At: [facility] [address] [place], [place] [id number] Lindon Romp MD [id number]    Source (AFB) SPUTUM  Final  Culture, blood (Routine X 2) w Reflex to ID Panel     Status: Abnormal   Collection Time: 06/18/17  9:35 AM  Result Value Ref Range Status   Specimen Description BLOOD LEFT HAND  Final   Special Requests   Final    BOTTLES DRAWN AEROBIC ONLY Blood Culture results may not be optimal due to an inadequate volume of blood received in culture bottles   Culture  Setup Time   Final    BUDDING YEAST SEEN AEROBIC BOTTLE ONLY CRITICAL RESULT CALLED TO, READ BACK BY AND VERIFIED WITH: N JOHNSTON 06/22/17 @ 1028 M VESTAL    Culture (A)  Final    CRYPTOCOCCUS NEOFORMANS CRITICAL RESULT CALLED TO, READ BACK BY AND VERIFIED WITH: Mellody Memos, RN AT 1500 ON 06/24/17 BY C. JESSUP, MLT.    Report Status 06/24/2017 FINAL  Final  Blood Culture ID Panel (Reflexed)     Status: None   Collection Time: 06/18/17  9:35 AM  Result Value Ref Range Status   Enterococcus species NOT DETECTED NOT DETECTED Final   Listeria monocytogenes NOT DETECTED NOT DETECTED Final   Staphylococcus species NOT DETECTED NOT DETECTED Final   Staphylococcus aureus NOT DETECTED NOT DETECTED Final   Streptococcus species NOT DETECTED NOT DETECTED Final   Streptococcus agalactiae NOT DETECTED NOT DETECTED Final   Streptococcus pneumoniae NOT DETECTED NOT DETECTED Final   Streptococcus pyogenes NOT DETECTED NOT DETECTED Final   Acinetobacter baumannii NOT DETECTED NOT DETECTED Final   Enterobacteriaceae species NOT DETECTED NOT DETECTED Final   Enterobacter cloacae complex NOT DETECTED NOT DETECTED Final   Escherichia coli NOT DETECTED NOT DETECTED Final   Klebsiella oxytoca NOT  DETECTED NOT DETECTED Final   Klebsiella pneumoniae NOT DETECTED NOT DETECTED Final   Proteus species NOT DETECTED NOT DETECTED Final   Serratia marcescens NOT DETECTED NOT DETECTED Final   Haemophilus influenzae NOT DETECTED NOT DETECTED Final   Neisseria meningitidis NOT DETECTED NOT DETECTED Final   Pseudomonas  aeruginosa NOT DETECTED NOT DETECTED Final   Candida albicans NOT DETECTED NOT DETECTED Final   Candida glabrata NOT DETECTED NOT DETECTED Final   Candida krusei NOT DETECTED NOT DETECTED Final   Candida parapsilosis NOT DETECTED NOT DETECTED Final   Candida tropicalis NOT DETECTED NOT DETECTED Final  Culture, blood (Routine X 2) w Reflex to ID Panel     Status: Abnormal   Collection Time: 06/18/17  9:36 AM  Result Value Ref Range Status   Specimen Description BLOOD LEFT ARM  Final   Special Requests   Final    BOTTLES DRAWN AEROBIC ONLY Blood Culture results may not be optimal due to an inadequate volume of blood received in culture bottles   Culture  Setup Time   Final    BUDDING YEAST SEEN AEROBIC BOTTLE ONLY CRITICAL RESULT CALLED TO, READ BACK BY AND VERIFIED WITH: N JOHNSTON 06/22/17 @ 1028 M VESTAL    Culture (A)  Final    CRYPTOCOCCUS NEOFORMANS CRITICAL RESULT CALLED TO, READ BACK BY AND VERIFIED WITH: Mellody Memos, RN AT 1500 ON 06/24/17 BY C. JESSUP, MLT.    Report Status 06/24/2017 FINAL  Final  MRSA PCR Screening     Status: None   Collection Time: 06/19/17 12:18 PM  Result Value Ref Range Status   MRSA by PCR NEGATIVE NEGATIVE Final    Comment:        The GeneXpert MRSA Assay (FDA approved for NASAL specimens only), is one component of a comprehensive MRSA colonization surveillance program. It is not intended to diagnose MRSA infection nor to guide or monitor treatment for MRSA infections.   Acid Fast Smear (AFB)     Status: None   Collection Time: 06/19/17  1:05 PM  Result Value Ref Range Status   AFB Specimen Processing Concentration  Final   Acid  Fast Smear Negative  Final    Comment: (NOTE) Performed At: [facility] [place], [place] [id number] Lindon Romp MD [id number]    Source (AFB) BRONCHIAL ALVEOLAR LAVAGE  Final  Acid Fast Smear (AFB)     Status: None   Collection Time: 06/19/17  1:05 PM  Result Value Ref Range Status   AFB Specimen Processing Concentration  Final   Acid Fast Smear Negative  Final    Comment: (NOTE) Performed At: [facility] [place], [place] [id number] Lindon Romp MD [id number]    Source (AFB) BRONCHIAL ALVEOLAR LAVAGE  Final  Culture, fungus without smear     Status: Abnormal (Preliminary result)   Collection Time: 06/19/17  1:05 PM  Result Value Ref Range Status   Specimen Description BRONCHIAL ALVEOLAR LAVAGE  Final   Special Requests Immunocompromised  Final   Culture CANDIDA ALBICANS (A)  Final   Report Status PENDING  Incomplete  Pneumocystis smear by DFA     Status: None   Collection Time: 06/19/17  1:05 PM  Result Value Ref Range Status   Specimen Source-PJSRC BRONCHIAL ALVEOLAR LAVAGE  Final   Pneumocystis jiroveci Ag NEGATIVE  Final    Comment: Performed at [facility]  Culture, bal-quantitative     Status: Abnormal   Collection Time: 06/19/17  1:05 PM  Result Value Ref Range Status   Specimen Description BRONCHIAL ALVEOLAR LAVAGE  Final   Special Requests NONE  Final   Gram Stain   Final    ABUNDANT WBC PRESENT, PREDOMINANTLY PMN MODERATE GRAM VARIABLE ROD MODERATE GRAM POSITIVE COCCI IN  PAIRS RARE BUDDING YEAST SEEN    Culture 20,000 COLONIES/mL PSEUDOMONAS AERUGINOSA (A)  Final   Report Status 06/21/2017 FINAL  Final   Organism ID, Bacteria PSEUDOMONAS AERUGINOSA (A)  Final      Susceptibility   Pseudomonas aeruginosa - MIC*    CEFTAZIDIME 4 SENSITIVE Sensitive     CIPROFLOXACIN <=0.25 SENSITIVE Sensitive     GENTAMICIN <=1 SENSITIVE Sensitive     IMIPENEM 1 SENSITIVE Sensitive     PIP/TAZO 8  SENSITIVE Sensitive     CEFEPIME 2 SENSITIVE Sensitive     * 20,000 COLONIES/mL PSEUDOMONAS AERUGINOSA  CSF culture with Stat gram stain     Status: None   Collection Time: 06/19/17  1:30 PM  Result Value Ref Range Status   Specimen Description CSF  Final   Special Requests NONE  Final   Gram Stain   Final    CYTOSPIN SMEAR WBC PRESENT, PREDOMINANTLY MONONUCLEAR YEAST CRITICAL RESULT CALLED TO, READ BACK BY AND VERIFIED WITH: Jessie Foot RN AT 0345 ON [id number] BY SJW    Culture FEW CRYPTOCOCCUS NEOFORMANS  Final   Report Status 06/22/2017 FINAL  Final  Fungus Culture With Stain     Status: None (Preliminary result)   Collection Time: 06/19/17  1:30 PM  Result Value Ref Range Status   Fungus Stain Final report  Final   Fungus (Mycology) Culture Preliminary report  Final    Comment: (NOTE) Performed At: [facility] [place], [place] [id number] Lindon Romp MD [id number]    Fungal Source PENDING  Incomplete  Fungus Culture Result     Status: None   Collection Time: 06/19/17  1:30 PM  Result Value Ref Range Status   Result 1 Comment  Final    Comment: (NOTE) Fungal elements, such as arthroconidia, hyphal fragments, chlamydoconidia, observed. POSITIVE SMEAR REPORTED TO LESLEY B. AT 1115 ON 05/22/17 BY AM Performed At: [facility] [place], [place] [id number] Lindon Romp MD [id number]   Fungal organism reflex     Status: None   Collection Time: 06/19/17  1:30 PM  Result Value Ref Range Status   Fungal result 1 Cryptococcus species  Corrected    Comment: (NOTE) Identification to follow. PRELIMINARY POSITIVE CULTURE REPORT CALLED TO MATTHEW V. 06-23-17 AT 1105, FAX TO [phone]   KMP PRELIMINARY IDENTIFICATION REPORT CALLED TO MATTHEW V. 06-23-17 AT 1422, FAX WAS NOT REQUESTED   KMP Performed At: [facility] [place], [place] [id number] Lindon Romp MD [id number] CORRECTED ON 08/07 AT  1435: PREVIOUSLY REPORTED AS Comment   Acid Fast Smear (AFB)     Status: None   Collection Time: 06/21/17 12:12 PM  Result Value Ref Range Status   AFB Specimen Processing Concentration  Final   Acid Fast Smear Negative  Final    Comment: (NOTE) Performed At: [facility] [place], [place] [id number] Lindon Romp MD [id number]    Source (AFB) TRACHEAL ASPIRATE  Final  CSF culture     Status: None (Preliminary result)   Collection Time: 06/22/17  4:25 PM  Result Value Ref Range Status   Specimen Description CSF  Final   Special Requests NONE  Final   Gram Stain   Final    CYTOSPIN SMEAR WBC PRESENT,BOTH PMN AND MONONUCLEAR ENCAPSULATED YEAST SEEN CRITICAL RESULT CALLED TO, READ BACK BY AND VERIFIED WITH: Heath Lark RN 17:10 06/22/17 ([email or web address])    Culture NO GROWTH 2 DAYS  Final   Report Status PENDING  Incomplete    Lars Mage, MD [facility] [place] [phone] pager   [phone] cell 06/24/2017, 5:29 PM

## 2017-06-24 NOTE — Progress Notes (Signed)
This note also relates to the following rows which could not be included: SpO2 - Cannot attach notes to unvalidated device data  Pt placed back on full vent support due to low RR.

## 2017-06-24 NOTE — Procedures (Signed)
[facility] PCCM  Indication: Crypto, HIV, change in neuro exam (ptosis) Reviewed Head CT with Dr. Alfredo BattyMattern: no acute changes  Time out performed  Skin cleaned with chlorhexadine  Full barrier/sterile technique (gown, gloves, mask) used.  1% lidocaine, 2cc used to anesthetize the skin over lumbar spine.  20cc of clear CSF removed Opening pressure 9.5 cm H20 Closing pressure 7 cm H20.  No complications  Heber CarolinaBrent McQuaid, MD [place] [facility] Pager: [phone] Cell: [phone] After 3pm or if no response, call [phone]

## 2017-06-24 NOTE — Progress Notes (Signed)
[facility] LAB REPLACEMENT ONLY  The patient does apply for the [facility] Electrolyte Re[facility] Protocol based on the criteria listed below:   1. Is GFR >/= 40 ml/min? Yes.    Patient's GFR today is >60 2. Is urine output >/= 0.5 ml/kg/hr for the last 6 hours? Yes.   Patient's UOP is 2.22 ml/kg/hr 3. Is BUN < 60 mg/dL? Yes.    Patient's BUN today is 6 4. Abnormal electrolyte(s):Potassium 3.6 5. Ordered repletion with: Potassium per protocol 6. If a panic level lab has been reported, has the CCM MD in charge been notified? No..   Physician:    Orland DecLANTZY, Tejon Gracie P 06/24/2017 4:21 AM

## 2017-06-24 NOTE — Progress Notes (Signed)
75 ml of fentanyl wasted down the sink, witnessed by Lucretia Roersachel Wilson RN

## 2017-06-24 NOTE — Progress Notes (Signed)
Saw and examined [name] this am.  He is interactive to my questions and denies pain.  Continues to be on the ventilator and is being treated for cryptococcal meningitis and psuedamonas pneumonia.  We will continue to follow.  We appreciate the excellent care provided by the CCM team and will resume care when patient is transferred back to our service.

## 2017-06-24 NOTE — Progress Notes (Addendum)
RT Note: Patient transported on vent to STAT CT Scan, patient vitals stable throughout and upon return to room. RT will continue to monitor.

## 2017-06-24 NOTE — Progress Notes (Signed)
PULMONARY / CRITICAL CARE MEDICINE   Name: Kenneth Rivas MRN: [id number] DOB: 1961-03-02    ADMISSION DATE:  06/16/2017   CONSULTATION DATE:  06/19/2017  REFERRING MD:  Drucilla Schmidt   CHIEF COMPLAINT:   Respiratory distress and altered mental status   HISTORY OF PRESENT ILLNESS:  56 y.o. 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 Admitted on 7/31 w/ cc: weakness and FTT. Admitting dx: FTT and cachexia d/t AIDs, pulmonary nodules, bedbugs, and HTn. Had CT chest showing cavitary lung lesion in the RML. ID was consulted and ordered serum crypto, this was positive. The patient was complaining of headache. An attempt to have LP on 8/2 was unsuccessful due to what was felt to be positional hypoxia during prone positioning. PCCM was asked to see him on 8/3 for formal consult w/ request to obtain LP as well as bronchoscopy/ BAL for AFB. As active TB and or cryptococcal infection would be contra-indication to resuming ARVs acutely   Subjective:  No acute events. Tolerated SBT/PS wean for 1 hour today. Remains off sedation. Patient had 2 bowel movements yesterday.   Review of Systems:  Unable to obtain with intubation.   VITAL SIGNS: BP (!) 139/96   Pulse 77   Temp 98 F (36.7 C) (Oral)   Resp 16   Ht _0  (1.676 m)   Wt 113 lb 15.7 oz (51.7 kg)   SpO2 100%   BMI 18.40 kg/m   HEMODYNAMICS:    VENTILATOR SETTINGS: Vent Mode: PRVC FiO2 (%):  [40 %] 40 % Set Rate:  [12 bmp] 12 bmp Vt Set:  [500 mL] 500 mL PEEP:  [5 cmH20] 5 cmH20 Pressure Support:  [10 cmH20] 10 cmH20 Plateau Pressure:  [7 cmH20-15 cmH20] 7 cmH20  INTAKE / OUTPUT: I/O last 3 completed shifts: In: 8250 [I.V.:2820; NG/GT:1840; IV Piggyback:2850] Out: 5275 [Urine:5275]   General:  No family at bedside. Eyes closed. TV currently on. Integument:  No rash. Warm. Dry. Extremities:  No cyanosis or clubbing.  HEENT:  Endotracheal tube in  place. No scleral icterus. Cardiovascular:  Regular rate and rhythm. No edema.  Pulmonary:  Clear to auscultation. Symmetric chest wall expansion on ventilator. Abdomen: Soft. Normal bowel sounds. Protuberant.  Musculoskeletal:  Normal bulk and tone. No joint deformity or effusion appreciated. Neurologic: Patient opens eyes to voice. Nods to questions. Wiggles toes on command.  LABS:  BMET  Recent Labs Lab 06/22/17 1643 06/23/17 0230 06/24/17 0228  NA 130* 131* 131*  K 4.4 3.9 3.6  CL 101 102 100*  CO2 20* 24 26  BUN _1 CREATININE 0.56* 0.53* 0.53*  GLUCOSE 102* 106* 102*    Electrolytes  Recent Labs Lab 06/22/17 1643 06/23/17 0230 06/24/17 0228  CALCIUM 8.0* 8.0* 8.4*  MG 2.0 1.9 1.7  PHOS 3.1 3.2 3.2    CBC  Recent Labs Lab 06/22/17 0548 06/23/17 0230 06/24/17 0228  WBC 2.9* 2.1* 1.5*  HGB 9.2* 8.7* 9.1*  HCT 27.6* 26.2* 27.0*  PLT 133* 142* 180    Coag's No results for input(s): APTT, INR in the last 168 hours.  Sepsis Markers No results for input(s): LATICACIDVEN, PROCALCITON, O2SATVEN in the last 168 hours.  ABG  Recent Labs Lab 06/19/17 2052 06/20/17 0515  PHART 7.549* 7.434  PCO2ART 37.7 39.9  PO2ART 503.0* 163*    Liver Enzymes No results for input(s): AST, ALT, ALKPHOS, BILITOT, ALBUMIN in the last 168  hours.  Cardiac Enzymes No results for input(s): TROPONINI, PROBNP in the last 168 hours.  Glucose  Recent Labs Lab 06/23/17 1550 06/23/17 2111 06/24/17 0005 06/24/17 0401 06/24/17 0749 06/24/17 1130  GLUCAP 102* 103* 118* 100* 112* 89    Imaging No results found.   IMAGING/STUDIES: UDS 7/26:  Cocaine Positive UDS 7/31:  Cocaine Positive  CT CHEST W/ 8/1:  7 mm cavitary lesion is seen in the right middle lobe. It is uncertain if this represents cavitary metastatic lesion or small pulmonary abscess. Clinical correlation is recommended. Follow-up CT scan in 6-8 weeks is recommended to determine stability or  resolution. CT HEAD W/O 8/2:  Progression of generalized atrophy which may be due to HIV infection. Interval development of diffuse white matter disease in the periventricular white matter with focal hypodensity left inferior frontal lobe. This could be due to chronic ischemia however in the setting of HIV also consider infection and less likely tumor. Follow-up MRI of the brain with contrast suggested. PORT CXR 8/3:  Previously reviewed by me. Endotracheal tube in good position. Enteric feeding tube going below diaphragm. No focal opacity.   MICROBIOLOGY: Quantiferon- TB 8/1:  Indeterminate Sputum AFB Culture 8/1 >>> (smear negative) SPutum AFB Culture 8/2 >>> (smear negative) Sputum Culture 8/2:  Negative Cryptococcal Ag Titer 8/2:  >1:2560  MRSA PCR 8/3:  Negative  Blood Cultures x2 8/2 >>> 2/2 Positive Budding Yeast BAL 8/3 >>>  Pseudomonas aeruginosa / C albican / AFB pending (smear negative) Cryptococcal Ag Titer 8/3:  >1:2560 CSF Culture 8/3: Cyrptococcus neoformans ABG Culture 8/5 >>> pending (smear negative) CSF Culture 8/6 >>>  ANTIBIOTICS: Vancomycin 8/3 - 8/5 Zosyn 8/3 - 8/5 Flucytosine 8/2 - 8/8 Azithromycin weekly  Bactrim DS daily Amphotericin B 8/2 >>> Fortaz 8/5 >>>  SIGNIFICANT EVENTS: 07/31 - Admit 08/03 - Bronchoscopy by DF w/ purulent secretions. LP w/ opening pressure 47cm H2O & closing pressure 17cm H2O after removing 10cc CSF 08/04 - LP opening pressure 38cm H2O & closing pressure 7cm after 9cm  08/04 - LP opening pressure 14cm H2O & closing pressure 14cm H2O 08/06 - LP opening pressure 15cm H2O & closing pressure 6cm H2O - 28 mL removed 08/08 - Tolerated 1 hour of SBT/PS wean  LINES/TUBES: OETT 8/3 >>> Foley >>> R NGT >>> PIV  ASSESSMENT / PLAN:  PULMONARY A: Acute hypoxic respiratory failure: Secondary to inability to protect airway. Improving. Right middle lobe cavitary nodule: Likely secondary to cryptococcal infection. Pseudomonas  pneumonia  P:   Continuing full ventilator support Daily pressures were weaning and spontaneous breathing trial   CARDIOVASCULAR A:  Essential hypertension: Blood pressure controlled.   P:  Continue telemetry monitoring Monitoring vitals per unit protocol Continue Norvasc Hydralazine IV when necessary   RENAL A:   Hyponatremia: Improving slowly. Hypomagnesemia: Resolved. Hypokalemia: Resolved.   P:   Trending urine output with Foley Monitoring renal function and electrolytes daily  Magnesium & Potassium replacement today per protocol  GASTROINTESTINAL A:   Severe protein calorie malnutrition: Patient also has failure to thrive.  Constipation:  Resolved.  P:   Continuing Senna via tube qhs Continuing tube feedings Continuing Protonix via tube daily  HEMATOLOGIC A:   Anemia: Secondary to anemia of chronic disease. No signs of active bleeding. Hgb stable. Leukopenia:  Progressive. Thrombocytopenia: Resolved. Likely secondary to sepsis.   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 Pseudomonas pneumonia  Cavitary right middle lobe nodule: Likely secondary to cryptococcal infection. AIDS Hepatitis B viral infection   P:   Continuing antibiotics as above ID following and guiding antibiotics Awaiting finalization of cultures and infectious workup   NEUROLOGIC A:   Cryptococcal meningitis Increased CSF/intracranial pressure: Improving. Undergoing serial lumbar punctures. INR & neurosurgery refused to place lumbar drain.   P:   Desired RASS:  0 to -1 Plan for repeat lumbar puncture on Thursday - Goal Closing Pressure <18 Intermittent sedation   Endocrine A: No acute issues.  P: Monitoring glucose with labs daily.   FAMILY  - Updates:  Sisters updated 8/6 at bedside by Dr. Ashok Cordia. No family at  bedside during rounds 8/8.   DISCUSSION:  56 y.o. male with disseminated cryptococcal infection with both meningitis and bacteremia. Mental status slowly improving. Respiratory status slowly improving. Continuing to limit sedation. Appreciate assistance from ID and guiding antibiotic therapy and treatment for this patient.  I have spent a total of 32 minutes of critical care time today caring for the patient and reviewing the patient's electronic medical record.   Sonia Baller Ashok Cordia, M.D. [facility] Pulmonary [facility] Critical [facility] Pager:  [phone] After 3pm or if no response, call [phone] 06/24/2017  12:15 PM

## 2017-06-25 ENCOUNTER — Inpatient Hospital Stay (HOSPITAL_COMMUNITY): Payer: Medicaid Other

## 2017-06-25 DIAGNOSIS — J9601 Acute respiratory failure with hypoxia: Secondary | ICD-10-CM

## 2017-06-25 DIAGNOSIS — H02402 Unspecified ptosis of left eyelid: Secondary | ICD-10-CM

## 2017-06-25 LAB — BASIC METABOLIC PANEL
ANION GAP: 6 (ref 5–15)
CHLORIDE: 102 mmol/L (ref 101–111)
CO2: 25 mmol/L (ref 22–32)
Calcium: 8.2 mg/dL — ABNORMAL LOW (ref 8.9–10.3)
Creatinine, Ser: 0.54 mg/dL — ABNORMAL LOW (ref 0.61–1.24)
Glucose, Bld: 99 mg/dL (ref 65–99)
POTASSIUM: 3.3 mmol/L — AB (ref 3.5–5.1)
SODIUM: 133 mmol/L — AB (ref 135–145)

## 2017-06-25 LAB — CBC WITH DIFFERENTIAL/PLATELET
BASOS ABS: 0 10*3/uL (ref 0.0–0.1)
BASOS PCT: 1 %
Band Neutrophils: 0 %
Blasts: 0 %
Eosinophils Absolute: 0.2 10*3/uL (ref 0.0–0.7)
Eosinophils Relative: 10 %
HCT: 27 % — ABNORMAL LOW (ref 39.0–52.0)
HEMOGLOBIN: 9.1 g/dL — AB (ref 13.0–17.0)
Lymphocytes Relative: 31 %
Lymphs Abs: 0.5 10*3/uL — ABNORMAL LOW (ref 0.7–4.0)
MCH: 27.1 pg (ref 26.0–34.0)
MCHC: 33.7 g/dL (ref 30.0–36.0)
MCV: 80.4 fL (ref 78.0–100.0)
METAMYELOCYTES PCT: 0 %
MYELOCYTES: 0 %
Monocytes Absolute: 0.3 10*3/uL (ref 0.1–1.0)
Monocytes Relative: 17 %
NEUTROS PCT: 41 %
NRBC: 0 /100{WBCs}
Neutro Abs: 0.5 10*3/uL — ABNORMAL LOW (ref 1.7–7.7)
Other: 0 %
PROMYELOCYTES ABS: 0 %
Platelets: 169 10*3/uL (ref 150–400)
RBC: 3.36 MIL/uL — ABNORMAL LOW (ref 4.22–5.81)
RDW: 14.6 % (ref 11.5–15.5)
WBC: 1.5 10*3/uL — AB (ref 4.0–10.5)

## 2017-06-25 LAB — GLUCOSE, CAPILLARY
Glucose-Capillary: 86 mg/dL (ref 65–99)
Glucose-Capillary: 105 mg/dL — ABNORMAL HIGH (ref 65–99)

## 2017-06-25 LAB — PHOSPHORUS: Phosphorus: 3 mg/dL (ref 2.5–4.6)

## 2017-06-25 LAB — MAGNESIUM: MAGNESIUM: 1.6 mg/dL — AB (ref 1.7–2.4)

## 2017-06-25 MED ORDER — MAGNESIUM SULFATE IN D5W 1-5 GM/100ML-% IV SOLN
1.0000 g | Freq: Once | INTRAVENOUS | Status: AC
  Administered 2017-06-25: 1 g via INTRAVENOUS
  Filled 2017-06-25: qty 100

## 2017-06-25 MED ORDER — POTASSIUM CHLORIDE 20 MEQ/15ML (10%) PO SOLN
20.0000 meq | ORAL | Status: AC
  Administered 2017-06-25 (×3): 20 meq
  Filled 2017-06-25 (×4): qty 15

## 2017-06-25 MED ORDER — GADOBENATE DIMEGLUMINE 529 MG/ML IV SOLN
10.0000 mL | Freq: Once | INTRAVENOUS | Status: AC | PRN
  Administered 2017-06-25: 10 mL via INTRAVENOUS

## 2017-06-25 NOTE — Progress Notes (Signed)
Kenneth Rivas for Infectious Disease  Date of Admission:  06/16/2017   Total days of antibiotics 8        Day 7 amphotericin        Day 4 ceftazidime         ASSESSMENT and PLAN:  Disseminated cryptococcal disease- Meningitis and Pneumonia:  -Repeat LP yesterday 8/8 revealed an OP=9.5, closing pressure=7, removed 20 cc of csf. It is reassuring that OP continues to decrease 38 (8/4)>14 (8/4)> 15 (8/6)> 9.5 (8/8), this is suggestive of less likelihood of needing a evdrain. CSF (8/8) showed wbc=37, presence of yeast on microscopic exam -CT head 8/8 showed no acute intracranial abnormalities, volume loss, and mild chronic microvascular ischemic change.  -Continue induction therapy with Amphotericin to treat cryptococcal meningitis and pneumonia for 2 weeks. Will follow with consolidation therapy with fluconzole qd for 8 weeks. -Renal function remains unchanged with cr=0.54 (8/9) -Continue ceftazidime to cover for pseudomonas aeruginosa   HIV/AIDS - Will not start antiretroviral therapy due to risk of IRIS. Will consider in 5 weeks. -Continue azithromycin and bactrim as patient is on ART   . acetaminophen (TYLENOL) oral liquid 160 mg/5 mL  650 mg Per Tube Q6H  . amLODipine  5 mg Per Tube Daily  . azithromycin  1,200 mg Per Tube Weekly  . chlorhexidine gluconate (MEDLINE KIT)  15 mL Mouth Rinse BID  . dextrose  10 mL Intravenous Q24H  . dextrose  10 mL Intravenous Q24H  . heparin subcutaneous  5,000 Units Subcutaneous Q8H  . mouth rinse  15 mL Mouth Rinse QID  . pantoprazole sodium  40 mg Per Tube Daily  . potassium chloride  20 mEq Per Tube Q4H  . sennosides  5 mL Per Tube QHS  . sulfamethoxazole-trimethoprim  1 tablet Per Tube Daily    SUBJECTIVE: Kenneth Rivas was seen laying in his bed with tv on. He was responsive to questions non-verbally. He denied any headaches, sob, or chest pain. He was informed of his csf results and CT results.   Review of  Systems: ROS as above  No Active Allergies  OBJECTIVE: Vitals:   06/25/17 0700 06/25/17 0800 06/25/17 0813 06/25/17 0836  BP: 133/88 (!) 141/90  (!) 141/90  Pulse: 78 76  71  Resp: _0 Temp:   97.8 F (36.6 C)   TempSrc:   Oral   SpO2: 100% 100%  100%  Weight:      Height:       Body mass index is 17.44 kg/m.  Physical Exam  Constitutional: He appears unhealthy. He appears toxic. He has a sickly appearance.  HENT:  Head: Normocephalic and atraumatic.  Eyes:  Left eyelid drooping  Cardiovascular: Normal rate, regular rhythm, normal heart sounds and intact distal pulses.   Pulmonary/Chest: Effort normal. He has rales.  Abdominal: He exhibits no distension.  Neurological: He is alert.    Lab Results Lab Results  Component Value Date   WBC 1.5 (L) 06/25/2017   HGB 9.1 (L) 06/25/2017   HCT 27.0 (L) 06/25/2017   MCV 80.4 06/25/2017   PLT 169 06/25/2017    Lab Results  Component Value Date   CREATININE 0.54 (L) 06/25/2017   BUN <5 (L) 06/25/2017   NA 133 (L) 06/25/2017   K 3.3 (L) 06/25/2017   CL 102 06/25/2017   CO2 25 06/25/2017    Lab Results  Component Value Date   ALT 13 (L) 06/16/2017  AST 19 06/16/2017   ALKPHOS 58 06/16/2017   BILITOT 0.8 06/16/2017     Microbiology: Recent Results (from the past 240 hour(s))  Acid Fast Smear (AFB)     Status: None   Collection Time: 06/17/17 11:29 PM  Result Value Ref Range Status   AFB Specimen Processing Concentration  Final   Acid Fast Smear Negative  Final    Comment: (NOTE) Performed At: [facility] [address] [place], [place] [id number] Lindon Romp MD SJ:[id number]    Source (AFB) SPUTUM  Final  Culture, expectorated sputum-assessment     Status: None   Collection Time: 06/18/17  5:00 AM  Result Value Ref Range Status   Specimen Description SPUTUM  Final   Special Requests Immunocompromised  Final   Sputum evaluation   Final    Sputum specimen not acceptable for testing.   Please recollect.   Gram Stain Report Called to,Read Back By and Verified With: RN Anna Genre [phone] [id number] MLM    Report Status 06/18/2017 FINAL  Final  Acid Fast Smear (AFB)     Status: None   Collection Time: 06/18/17  5:00 AM  Result Value Ref Range Status   AFB Specimen Processing Concentration  Final   Acid Fast Smear Negative  Final    Comment: (NOTE) Performed At: [facility] [address] [place], [place] [id number] Lindon Romp MD XN:[id number]    Source (AFB) SPUTUM  Final  Culture, blood (Routine X 2) w Reflex to ID Panel     Status: Abnormal   Collection Time: 06/18/17  9:35 AM  Result Value Ref Range Status   Specimen Description BLOOD LEFT HAND  Final   Special Requests   Final    BOTTLES DRAWN AEROBIC ONLY Blood Culture results may not be optimal due to an inadequate volume of blood received in culture bottles   Culture  Setup Time   Final    BUDDING YEAST SEEN AEROBIC BOTTLE ONLY CRITICAL RESULT CALLED TO, READ BACK BY AND VERIFIED WITH: N JOHNSTON 06/22/17 @ 1028 M VESTAL    Culture (A)  Final    CRYPTOCOCCUS NEOFORMANS CRITICAL RESULT CALLED TO, READ BACK BY AND VERIFIED WITH: Mellody Memos, RN AT 1500 ON 06/24/17 BY C. JESSUP, MLT.    Report Status 06/24/2017 FINAL  Final  Blood Culture ID Panel (Reflexed)     Status: None   Collection Time: 06/18/17  9:35 AM  Result Value Ref Range Status   Enterococcus species NOT DETECTED NOT DETECTED Final   Listeria monocytogenes NOT DETECTED NOT DETECTED Final   Staphylococcus species NOT DETECTED NOT DETECTED Final   Staphylococcus aureus NOT DETECTED NOT DETECTED Final   Streptococcus species NOT DETECTED NOT DETECTED Final   Streptococcus agalactiae NOT DETECTED NOT DETECTED Final   Streptococcus pneumoniae NOT DETECTED NOT DETECTED Final   Streptococcus pyogenes NOT DETECTED NOT DETECTED Final   Acinetobacter baumannii NOT DETECTED NOT DETECTED Final   Enterobacteriaceae species NOT DETECTED NOT DETECTED  Final   Enterobacter cloacae complex NOT DETECTED NOT DETECTED Final   Escherichia coli NOT DETECTED NOT DETECTED Final   Klebsiella oxytoca NOT DETECTED NOT DETECTED Final   Klebsiella pneumoniae NOT DETECTED NOT DETECTED Final   Proteus species NOT DETECTED NOT DETECTED Final   Serratia marcescens NOT DETECTED NOT DETECTED Final   Haemophilus influenzae NOT DETECTED NOT DETECTED Final   Neisseria meningitidis NOT DETECTED NOT DETECTED Final   Pseudomonas aeruginosa NOT DETECTED NOT DETECTED Final   Candida  albicans NOT DETECTED NOT DETECTED Final   Candida glabrata NOT DETECTED NOT DETECTED Final   Candida krusei NOT DETECTED NOT DETECTED Final   Candida parapsilosis NOT DETECTED NOT DETECTED Final   Candida tropicalis NOT DETECTED NOT DETECTED Final  Culture, blood (Routine X 2) w Reflex to ID Panel     Status: Abnormal   Collection Time: 06/18/17  9:36 AM  Result Value Ref Range Status   Specimen Description BLOOD LEFT ARM  Final   Special Requests   Final    BOTTLES DRAWN AEROBIC ONLY Blood Culture results may not be optimal due to an inadequate volume of blood received in culture bottles   Culture  Setup Time   Final    BUDDING YEAST SEEN AEROBIC BOTTLE ONLY CRITICAL RESULT CALLED TO, READ BACK BY AND VERIFIED WITH: N JOHNSTON 06/22/17 @ 1028 M VESTAL    Culture (A)  Final    CRYPTOCOCCUS NEOFORMANS CRITICAL RESULT CALLED TO, READ BACK BY AND VERIFIED WITH: Mellody Memos, RN AT 1500 ON 06/24/17 BY C. JESSUP, MLT.    Report Status 06/24/2017 FINAL  Final  MRSA PCR Screening     Status: None   Collection Time: 06/19/17 12:18 PM  Result Value Ref Range Status   MRSA by PCR NEGATIVE NEGATIVE Final    Comment:        The GeneXpert MRSA Assay (FDA approved for NASAL specimens only), is one component of a comprehensive MRSA colonization surveillance program. It is not intended to diagnose MRSA infection nor to guide or monitor treatment for MRSA infections.   Acid Fast Smear  (AFB)     Status: None   Collection Time: 06/19/17  1:05 PM  Result Value Ref Range Status   AFB Specimen Processing Concentration  Final   Acid Fast Smear Negative  Final    Comment: (NOTE) Performed At: [facility] [place], [place] [id number] Lindon Romp MD JJ:[id number]    Source (AFB) BRONCHIAL ALVEOLAR LAVAGE  Final  Acid Fast Smear (AFB)     Status: None   Collection Time: 06/19/17  1:05 PM  Result Value Ref Range Status   AFB Specimen Processing Concentration  Final   Acid Fast Smear Negative  Final    Comment: (NOTE) Performed At: [facility] [place], [place] [id number] Lindon Romp MD FW:[id number]    Source (AFB) BRONCHIAL ALVEOLAR LAVAGE  Final  Culture, fungus without smear     Status: Abnormal (Preliminary result)   Collection Time: 06/19/17  1:05 PM  Result Value Ref Range Status   Specimen Description BRONCHIAL ALVEOLAR LAVAGE  Final   Special Requests Immunocompromised  Final   Culture CANDIDA ALBICANS (A)  Final   Report Status PENDING  Incomplete  Pneumocystis smear by DFA     Status: None   Collection Time: 06/19/17  1:05 PM  Result Value Ref Range Status   Specimen Source-PJSRC BRONCHIAL ALVEOLAR LAVAGE  Final   Pneumocystis jiroveci Ag NEGATIVE  Final    Comment: Performed at [facility]  Culture, bal-quantitative     Status: Abnormal   Collection Time: 06/19/17  1:05 PM  Result Value Ref Range Status   Specimen Description BRONCHIAL ALVEOLAR LAVAGE  Final   Special Requests NONE  Final   Gram Stain   Final    ABUNDANT WBC PRESENT, PREDOMINANTLY PMN MODERATE GRAM VARIABLE ROD MODERATE GRAM POSITIVE COCCI IN PAIRS RARE BUDDING YEAST SEEN    Culture  20,000 COLONIES/mL PSEUDOMONAS AERUGINOSA (A)  Final   Report Status 06/21/2017 FINAL  Final   Organism ID, Bacteria PSEUDOMONAS AERUGINOSA (A)  Final      Susceptibility   Pseudomonas aeruginosa - MIC*    CEFTAZIDIME 4 SENSITIVE  Sensitive     CIPROFLOXACIN <=0.25 SENSITIVE Sensitive     GENTAMICIN <=1 SENSITIVE Sensitive     IMIPENEM 1 SENSITIVE Sensitive     PIP/TAZO 8 SENSITIVE Sensitive     CEFEPIME 2 SENSITIVE Sensitive     * 20,000 COLONIES/mL PSEUDOMONAS AERUGINOSA  CSF culture with Stat gram stain     Status: None   Collection Time: 06/19/17  1:30 PM  Result Value Ref Range Status   Specimen Description CSF  Final   Special Requests NONE  Final   Gram Stain   Final    CYTOSPIN SMEAR WBC PRESENT, PREDOMINANTLY MONONUCLEAR YEAST CRITICAL RESULT CALLED TO, READ BACK BY AND VERIFIED WITH: Jessie Foot RN AT 0345 ON [id number] BY SJW    Culture FEW CRYPTOCOCCUS NEOFORMANS  Final   Report Status 06/22/2017 FINAL  Final  Fungus Culture With Stain     Status: None (Preliminary result)   Collection Time: 06/19/17  1:30 PM  Result Value Ref Range Status   Fungus Stain Final report  Final   Fungus (Mycology) Culture Preliminary report  Final    Comment: (NOTE) Performed At: [facility] [place], [place] [id number] Lindon Romp MD OX:[id number]    Fungal Source PENDING  Incomplete  Fungus Culture Result     Status: None   Collection Time: 06/19/17  1:30 PM  Result Value Ref Range Status   Result 1 Comment  Final    Comment: (NOTE) Fungal elements, such as arthroconidia, hyphal fragments, chlamydoconidia, observed. POSITIVE SMEAR REPORTED TO LESLEY B. AT 1115 ON 05/22/17 BY AM Performed At: [facility] [place], [place] [id number] Lindon Romp MD [id number]   Fungal organism reflex     Status: None   Collection Time: 06/19/17  1:30 PM  Result Value Ref Range Status   Fungal result 1 Cryptococcus species  Corrected    Comment: (NOTE) Identification to follow. PRELIMINARY POSITIVE CULTURE REPORT CALLED TO MATTHEW V. 06-23-17 AT 1105, FAX TO [phone]   KMP PRELIMINARY IDENTIFICATION REPORT CALLED TO MATTHEW V. 06-23-17 AT 1422, FAX WAS NOT  REQUESTED   KMP Performed At: [facility] [place], [place] [id number] Lindon Romp MD OV:[id number] CORRECTED ON 08/07 AT 1435: PREVIOUSLY REPORTED AS Comment   Acid Fast Smear (AFB)     Status: None   Collection Time: 06/21/17 12:12 PM  Result Value Ref Range Status   AFB Specimen Processing Concentration  Final   Acid Fast Smear Negative  Final    Comment: (NOTE) Performed At: [facility] [place], [place] [id number] Lindon Romp MD [id number]    Source (AFB) TRACHEAL ASPIRATE  Final  CSF culture     Status: None (Preliminary result)   Collection Time: 06/22/17  4:25 PM  Result Value Ref Range Status   Specimen Description CSF  Final   Special Requests NONE  Final   Gram Stain   Final    CYTOSPIN SMEAR WBC PRESENT,BOTH PMN AND MONONUCLEAR ENCAPSULATED YEAST SEEN CRITICAL RESULT CALLED TO, READ BACK BY AND VERIFIED WITH: Heath Lark RN 17:10 06/22/17 ([email or web address])    Culture NO GROWTH 2 DAYS  Final   Report Status PENDING  Incomplete  Lars Mage, [place] for [facility] [phone] pager   [phone] cell 06/25/2017, 9:06 AM

## 2017-06-25 NOTE — Progress Notes (Signed)
CSW still following pt needs regarding SNF placement.     Claude MangesKierra S. Mikahla Wisor, MSW, LCSW-A Emergency Department Clinical Social Worker [phone]

## 2017-06-25 NOTE — Progress Notes (Signed)
PULMONARY / CRITICAL CARE MEDICINE   Name: Kenneth Rivas MRN: [id number] DOB: October 18, 1961    ADMISSION DATE:  06/16/2017   CONSULTATION DATE:  06/19/2017  REFERRING MD:  Drucilla Schmidt   CHIEF COMPLAINT:   Respiratory distress and altered mental status   HISTORY OF PRESENT ILLNESS:  56 y.o. 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 Admitted on 7/31 w/ cc: weakness and FTT. Admitting dx: FTT and cachexia d/t AIDs, pulmonary nodules, bedbugs, and HTn. Had CT chest showing cavitary lung lesion in the RML. ID was consulted and ordered serum crypto, this was positive. The patient was complaining of headache. An attempt to have LP on 8/2 was unsuccessful due to what was felt to be positional hypoxia during prone positioning. PCCM was asked to see him on 8/3 for formal consult w/ request to obtain LP as well as bronchoscopy/ BAL for AFB. As active TB and or cryptococcal infection would be contra-indication to resuming ARVs acutely   Subjective:  Patient tolerated waning for 1 hour today and then had apnea. Underwent LP yesterday.   Review of Systems:  Unable to obtain with intubation.   VITAL SIGNS: BP 128/82   Pulse 65   Temp 97.8 F (36.6 C) (Oral)   Resp 12   Ht _0  (1.676 m)   Wt 108 lb 0.4 oz (49 kg)   SpO2 100%   BMI 17.44 kg/m   HEMODYNAMICS:    VENTILATOR SETTINGS: Vent Mode: PRVC FiO2 (%):  [30 %-100 %] 30 % Set Rate:  [12 bmp] 12 bmp Vt Set:  [500 mL] 500 mL PEEP:  [5 cmH20] 5 cmH20 Plateau Pressure:  [7 cmH20-18 cmH20] 12 cmH20  INTAKE / OUTPUT: I/O last 3 completed shifts: In: 7030 [I.V.:2700; NG/GT:1580; IV Piggyback:2750] Out: 8366 [Urine:4325]   General:  Sleeping until awoken. No distress.  Integument:  Warm. Dry. No rash on exposed skin. HEENT:  Moist membranes. No scleral icterus or injection.  Cardiovascular:  Regular rate. Regular rhythm. No edema.  Pulmonary:  Clear to  auscultation. Normal effort of breathing on full ventilator support. Abdomen: Mildly protuberant. Soft. Normal bowel sounds.  Musculoskeletal:  5/5 Strength bilateral hand grip, biceps, and foot flexion. Neurologic:  Left eyelid ptosis. Cranial nerve 4 palsy. Following commands and nods to questions. Pupils symmetric.   LABS:  BMET  Recent Labs Lab 06/23/17 0230 06/24/17 0228 06/25/17 0341  NA 131* 131* 133*  K 3.9 3.6 3.3*  CL 102 100* 102  CO2 _1 BUN 6 6 <5*  CREATININE 0.53* 0.53* 0.54*  GLUCOSE 106* 102* 99    Electrolytes  Recent Labs Lab 06/23/17 0230 06/24/17 0228 06/25/17 0341  CALCIUM 8.0* 8.4* 8.2*  MG 1.9 1.7 1.6*  PHOS 3.2 3.2 3.0    CBC  Recent Labs Lab 06/23/17 0230 06/24/17 0228 06/25/17 0341  WBC 2.1* 1.5* 1.5*  HGB 8.7* 9.1* 9.1*  HCT 26.2* 27.0* 27.0*  PLT 142* 180 169    Coag's No results for input(s): APTT, INR in the last 168 hours.  Sepsis Markers No results for input(s): LATICACIDVEN, PROCALCITON, O2SATVEN in the last 168 hours.  ABG  Recent Labs Lab 06/19/17 2052 06/20/17 0515  PHART 7.549* 7.434  PCO2ART 37.7 39.9  PO2ART 503.0* 163*    Liver Enzymes No results for input(s): AST, ALT, ALKPHOS, BILITOT, ALBUMIN in the last 168 hours.  Cardiac Enzymes No results for input(s): TROPONINI, PROBNP in  the last 168 hours.  Glucose  Recent Labs Lab 06/24/17 1130 06/24/17 1544 06/24/17 2005 06/24/17 2330 06/25/17 0340 06/25/17 0812  GLUCAP 89 117* 84 88 86 105*    Imaging Ct Head Wo Contrast  Result Date: 06/24/2017 CLINICAL DATA:  Mental status changes. EXAM: CT HEAD WITHOUT CONTRAST TECHNIQUE: Contiguous axial images were obtained from the base of the skull through the vertex without intravenous contrast. COMPARISON:  06/18/2017 FINDINGS: Brain: No evidence of acute infarction, hemorrhage, hydrocephalus, extra-axial collection or mass lesion/mass effect. There is ventricular sulcal enlargement reflecting  volume loss greater than expected for a patient of this age. Mild periventricular white matter hypoattenuation is also noted consistent with chronic microvascular ischemic change. Vascular: No hyperdense vessel or unexpected calcification. Skull: Normal. Negative for fracture or focal lesion. Sinuses/Orbits: No acute finding. Other: None. IMPRESSION: 1. No acute intracranial abnormalities. 2. Volume loss greater than expected for patient's age. Mild chronic microvascular ischemic change. Electronically Signed   By: Lajean Manes M.D.   On: 06/24/2017 18:57     IMAGING/STUDIES: UDS 7/26:  Cocaine Positive UDS 7/31:  Cocaine Positive  CT CHEST W/ 8/1:  7 mm cavitary lesion is seen in the right middle lobe. It is uncertain if this represents cavitary metastatic lesion or small pulmonary abscess. Clinical correlation is recommended. Follow-up CT scan in 6-8 weeks is recommended to determine stability or resolution. CT HEAD W/O 8/2:  Progression of generalized atrophy which may be due to HIV infection. Interval development of diffuse white matter disease in the periventricular white matter with focal hypodensity left inferior frontal lobe. This could be due to chronic ischemia however in the setting of HIV also consider infection and less likely tumor. Follow-up MRI of the brain with contrast suggested. PORT CXR 8/3:  Previously reviewed by me. Endotracheal tube in good position. Enteric feeding tube going below diaphragm. No focal opacity.  CT HEAD W/O 8/8: IMPRESSION: 1. No acute intracranial abnormalities. 2. Volume loss greater than expected for patient's age. Mild chronic microvascular ischemic change.  MICROBIOLOGY: Quantiferon- TB 8/1:  Indeterminate Sputum AFB Culture 8/1 >>> (smear negative) SPutum AFB Culture 8/2 >>> (smear negative) Sputum Culture 8/2:  Negative Cryptococcal Ag Titer 8/2:  >1:2560  MRSA PCR 8/3:  Negative  Blood Cultures x2 8/2 >>> 2/2 Positive Budding Yeast BAL 8/3 >>>   Pseudomonas aeruginosa / C albican / AFB pending (smear negative) Cryptococcal Ag Titer 8/3:  >1:2560 CSF Culture 8/3: Cyrptococcus neoformans ABG Culture 8/5 >>> pending (smear negative) CSF Culture 8/6 >>> CSF Cryto Ag Titer 8/8:  2560 CSF Culture 8/8 >>> Yeast  ANTIBIOTICS: Vancomycin 8/3 - 8/5 Zosyn 8/3 - 8/5 Flucytosine 8/2 - 8/8 Azithromycin weekly  Bactrim DS daily Amphotericin B 8/2 >>> [name] 8/5 >>> (end date 8/12)  SIGNIFICANT EVENTS: 07/31 - Admit 08/03 - Bronchoscopy by DF w/ purulent secretions. LP w/ opening pressure 47cm H2O & closing pressure 17cm H2O after removing 10cc CSF 08/04 - LP opening pressure 38cm H2O & closing pressure 7cm after 9cm  08/04 - LP opening pressure 14cm H2O & closing pressure 14cm H2O 08/06 - LP opening pressure 15cm H2O & closing pressure 6cm H2O - 28 mL removed 08/08 - Tolerated 1 hour of SBT/PS wean. Bedside LP by Dr. [place] Bells w/ change in eye exam >> opening pressure 9.5cm H2O & closing pressure 7cm H2O - 20 cc of clear CSF removed 08/09 - Still only tolerated 1 hour of SBT/PS wean  LINES/TUBES: OETT 8/3 >>> Foley >>> R NGT >>>  PIV  ASSESSMENT / PLAN:  PULMONARY A: Acute hypoxic respiratory failure: Secondary to inability to protect airway. Improving. Right middle lobe cavitary nodule: Likely secondary to cryptococcal infection. Pseudomonas pneumonia  P:   Daily SBT & PS weaning May require tracheostomy Continuing full ventilator support Checking Port CXR in AM Checking ABG in AM  CARDIOVASCULAR A:  Essential hypertension: Blood pressure controlled.   P:  Continue telemetry monitoring Monitoring vitals per unit protocol Continue Norvasc Hydralazine IV when necessary   RENAL A:   Hyponatremia: Improving slowly. Hypomagnesemia: Replaced.  Hypokalemia: Replaced.    P:   Magnesium & Potassium Replaced per protocol Trending urine output with Foley Monitoring renal function and electrolytes daily    GASTROINTESTINAL A:   Severe protein calorie malnutrition: Patient also has failure to thrive.  Constipation:  Resolved.  P:   Continuing Senna via tube qhs Continuing tube feedings Continuing Protonix via tube daily  HEMATOLOGIC A:   Anemia: Hgb stable. Secondary to anemia of chronic disease. No signs of active bleeding.  Leukopenia:  Stable. Likely due to AIDS. Thrombocytopenia: Resolved. Likely secondary to sepsis.   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 Pseudomonas pneumonia  Cavitary right middle lobe nodule: Likely secondary to cryptococcal infection. AIDS Hepatitis B viral infection   P:   Continuing antibiotics as above ID following and guiding antibiotics Awaiting finalization of cultures and infectious workup   NEUROLOGIC A:   Cranial Nerve Palsy Cryptococcal meningitis Increased CSF/intracranial pressure: Improving. Undergoing serial lumbar punctures. INR & neurosurgery refused to place lumbar drain.   P:   Desired RASS:  0 to -1 Intermittent Lumbar Puncture Intermittent sedation  MRI Brain w/ & w/o today   Endocrine A: No acute issues.  P: Monitoring glucose with labs daily.   FAMILY  - Updates:  Sisters updated 8/6 at bedside by Dr. Ashok Cordia. No family at bedside 8/9. Family updated by nursing staff via phone.   DISCUSSION:  56 y.o. male with disseminated cryptococcus infection with both meningitis and bacteremia. Patient appears to have a cranial number for palsy on the left. Obtaining MRI of the brain. Suspect this is secondary to patient's meningoencephalitis. Stable opening pressure during lumbar puncture is reassuring for clinical improvement with current treatment regimen. Assistance from ID is greatly appreciated.   I have spent a total of 33 minutes of critical care time today  caring for the patient and reviewing the patient's electronic medical record.   Sonia Baller Ashok Cordia, M.D. [facility] Pager:  [phone] After 3pm or if no response, call [phone] 06/25/2017  11:56 AM

## 2017-06-25 NOTE — Progress Notes (Signed)
RT Note:  Patient placed back on full support due to no patient effort.

## 2017-06-26 ENCOUNTER — Inpatient Hospital Stay (HOSPITAL_COMMUNITY): Payer: Medicaid Other

## 2017-06-26 DIAGNOSIS — L8990 Pressure ulcer of unspecified site, unspecified stage: Secondary | ICD-10-CM | POA: Insufficient documentation

## 2017-06-26 LAB — CBC WITH DIFFERENTIAL/PLATELET
BASOS PCT: 3 %
Basophils Absolute: 0 10*3/uL (ref 0.0–0.1)
EOS ABS: 0.2 10*3/uL (ref 0.0–0.7)
EOS PCT: 10 %
HEMATOCRIT: 25.1 % — AB (ref 39.0–52.0)
Hemoglobin: 8.4 g/dL — ABNORMAL LOW (ref 13.0–17.0)
LYMPHS ABS: 0.6 10*3/uL — AB (ref 0.7–4.0)
Lymphocytes Relative: 41 %
MCH: 26.9 pg (ref 26.0–34.0)
MCHC: 33.5 g/dL (ref 30.0–36.0)
MCV: 80.4 fL (ref 78.0–100.0)
MONO ABS: 0.2 10*3/uL (ref 0.1–1.0)
Monocytes Relative: 11 %
Neutro Abs: 0.5 10*3/uL — ABNORMAL LOW (ref 1.7–7.7)
Neutrophils Relative %: 35 %
PLATELETS: 174 10*3/uL (ref 150–400)
RBC: 3.12 MIL/uL — AB (ref 4.22–5.81)
RDW: 14.5 % (ref 11.5–15.5)
WBC: 1.5 10*3/uL — AB (ref 4.0–10.5)

## 2017-06-26 LAB — BLOOD GAS, ARTERIAL
Acid-Base Excess: 0.9 mmol/L (ref 0.0–2.0)
Bicarbonate: 24.2 mmol/L (ref 20.0–28.0)
DRAWN BY: 25203
FIO2: 30
MECHVT: 500 mL
O2 Saturation: 98.1 %
PEEP/CPAP: 5 cmH2O
PO2 ART: 109 mmHg — AB (ref 83.0–108.0)
Patient temperature: 98.6
RATE: 12 resp/min
pCO2 arterial: 33.9 mmHg (ref 32.0–48.0)
pH, Arterial: 7.468 — ABNORMAL HIGH (ref 7.350–7.450)

## 2017-06-26 LAB — BASIC METABOLIC PANEL
Anion gap: 4 — ABNORMAL LOW (ref 5–15)
BUN: 6 mg/dL (ref 6–20)
CALCIUM: 8.2 mg/dL — AB (ref 8.9–10.3)
CO2: 24 mmol/L (ref 22–32)
CREATININE: 0.53 mg/dL — AB (ref 0.61–1.24)
Chloride: 102 mmol/L (ref 101–111)
GFR calc Af Amer: >60 mL/min (ref >60)
GFR calc non Af Amer: >60 mL/min (ref >60)
GLUCOSE: 103 mg/dL — AB (ref 65–99)
Potassium: 3.9 mmol/L (ref 3.5–5.1)
Sodium: 130 mmol/L — ABNORMAL LOW (ref 135–145)

## 2017-06-26 LAB — MAGNESIUM: MAGNESIUM: 1.5 mg/dL — AB (ref 1.7–2.4)

## 2017-06-26 LAB — PHOSPHORUS: Phosphorus: 2.9 mg/dL (ref 2.5–4.6)

## 2017-06-26 LAB — PATHOLOGIST SMEAR REVIEW

## 2017-06-26 LAB — CSF CULTURE

## 2017-06-26 LAB — CSF CULTURE W GRAM STAIN

## 2017-06-26 MED ORDER — MAGNESIUM SULFATE 2 GM/50ML IV SOLN
2.0000 g | Freq: Once | INTRAVENOUS | Status: AC
  Administered 2017-06-26: 2 g via INTRAVENOUS
  Filled 2017-06-26: qty 50

## 2017-06-26 NOTE — Progress Notes (Signed)
Nutrition Follow-up  DOCUMENTATION CODES:   Underweight, Severe malnutrition in context of chronic illness  INTERVENTION:   Continue:  Vital AF 1.2 at 50 ml/h (1200 ml per day)  Provides 1440 kcal, 90 gm protein, 973 ml free water daily  NUTRITION DIAGNOSIS:   Malnutrition (Severe) related to chronic illness (HIV) as evidenced by severe depletion of body fat, severe depletion of muscle mass, energy intake < 75% for > or equal to 1 month.  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 PMH is significant for AIDS, hep B, cocaine use, and HTN.    Discussed patient in ICU rounds and with RN today. He is weaning today. MRI negative. Patient is tolerating TF well via NGT, Vital AF 1.2 at 50 ml/h is providing 1440 kcal, 90 gm protein, 973 ml free water daily. Patient is currently intubated on ventilator support MV: 7.3 L/min Temp (24hrs), Avg:98.1 F (36.7 C), Min:97.8 F (36.6 C), Max:98.5 F (36.9 C)   Labs reviewed: sodium 130 (L), magnesium 1.5 (L); potassium & phosphorus WNL Medications reviewed.  Diet Order:  Diet NPO time specified  Skin:  Reviewed, no issues  Last BM:  8/9  Height:   Ht Readings from Last 1 Encounters:  06/19/17 '5\' 6"'$  (1.676 m)    Weight:   Wt Readings from Last 1 Encounters:  06/26/17 112 lb 14 oz (51.2 kg)    Ideal Body Weight:  50 kg  BMI:  Body mass index is 18.22 kg/m.  Estimated Nutritional Needs:   Kcal:  3112  Protein:  75-90 grams  Fluid:  > 1.5 L  EDUCATION NEEDS:   Education needs addressed  Molli Barrows, [place], [place], [place] Pager [phone] After Hours Pager [phone]

## 2017-06-26 NOTE — Progress Notes (Signed)
PULMONARY / CRITICAL CARE MEDICINE   Name: Kenneth Rivas MRN: [id number] DOB: 1961-11-16    ADMISSION DATE:  06/16/2017   CONSULTATION DATE:  06/19/2017  REFERRING MD:  Drucilla Schmidt   CHIEF COMPLAINT:   Respiratory distress and altered mental status   HISTORY OF PRESENT ILLNESS:  56 y.o. 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 Admitted on 7/31 w/ cc: weakness and FTT. Admitting dx: FTT and cachexia d/t AIDs, pulmonary nodules, bedbugs, and HTn. Had CT chest showing cavitary lung lesion in the RML. ID was consulted and ordered serum crypto, this was positive. The patient was complaining of headache. An attempt to have LP on 8/2 was unsuccessful due to what was felt to be positional hypoxia during prone positioning. PCCM was asked to see him on 8/3 for formal consult w/ request to obtain LP as well as bronchoscopy/ BAL for AFB. As active TB and or cryptococcal infection would be contra-indication to resuming ARVs acutely   Subjective:  No acute events overnight. Patient tolerating spontaneous breathing trial better today.  Review of Systems:  Unable to obtain with intubation.   VITAL SIGNS: BP 127/82   Pulse 76   Temp 98.3 F (36.8 C) (Oral)   Resp 20   Ht _0  (1.676 m)   Wt 112 lb 14 oz (51.2 kg)   SpO2 100%   BMI 18.22 kg/m   HEMODYNAMICS:    VENTILATOR SETTINGS: Vent Mode: PSV;CPAP FiO2 (%):  [30 %] 30 % Set Rate:  [12 bmp] 12 bmp Vt Set:  [500 mL] 500 mL PEEP:  [5 cmH20] 5 cmH20 Pressure Support:  [10 cmH20] 10 cmH20 Plateau Pressure:  [8 cmH20-20 cmH20] 8 cmH20  INTAKE / OUTPUT: I/O last 3 completed shifts: In: 4098 [I.V.:2625; NG/GT:1700; IV Piggyback:2350] Out: 3700 [Urine:3700]   General:  Comfortable. Watching TV. No distress. Integument:  No rash. Warm. Dry.  HEENT:  No scleral icterus. Moist membranes. ETT in place. Cardiovascular:  Regular rhythm. Regular rate. No  edema.  Pulmonary:  Normal work of breathing on PS 10/5. Good aeration & clear to auscultation.  Abdomen: Soft. Protuberant. Normal bowel sounds.  Neurologic:  Left eyelid ptosis persists. Following commands. Moving all 4 extremities equally.   LABS:  BMET  Recent Labs Lab 06/24/17 0228 06/25/17 0341 06/26/17 0208  NA 131* 133* 130*  K 3.6 3.3* 3.9  CL 100* 102 102  CO2 _1 BUN 6 <5* 6  CREATININE 0.53* 0.54* 0.53*  GLUCOSE 102* 99 103*    Electrolytes  Recent Labs Lab 06/24/17 0228 06/25/17 0341 06/26/17 0208  CALCIUM 8.4* 8.2* 8.2*  MG 1.7 1.6* 1.5*  PHOS 3.2 3.0 2.9    CBC  Recent Labs Lab 06/24/17 0228 06/25/17 0341 06/26/17 0208  WBC 1.5* 1.5* 1.5*  HGB 9.1* 9.1* 8.4*  HCT 27.0* 27.0* 25.1*  PLT 180 169 174    Coag's No results for input(s): APTT, INR in the last 168 hours.  Sepsis Markers No results for input(s): LATICACIDVEN, PROCALCITON, O2SATVEN in the last 168 hours.  ABG  Recent Labs Lab 06/19/17 2052 06/20/17 0515 06/26/17 0325  PHART 7.549* 7.434 7.468*  PCO2ART 37.7 39.9 33.9  PO2ART 503.0* 163* 109*    Liver Enzymes No results for input(s): AST, ALT, ALKPHOS, BILITOT, ALBUMIN in the last 168 hours.  Cardiac Enzymes No results for input(s): TROPONINI, PROBNP in the last 168 hours.  Glucose  Recent Labs  Lab 06/24/17 1130 06/24/17 1544 06/24/17 2005 06/24/17 2330 06/25/17 0340 06/25/17 0812  GLUCAP 89 117* 84 88 86 105*    Imaging Mr Brain W Wo Contrast  Result Date: 06/25/2017 CLINICAL DATA:  Initial evaluation for acute altered mental status. History of HIV/AIDS, cryptococcal meningitis. EXAM: MRI HEAD WITHOUT AND WITH CONTRAST TECHNIQUE: Multiplanar, multiecho pulse sequences of the brain and surrounding structures were obtained without and with intravenous contrast. CONTRAST:  55m MULTIHANCE GADOBENATE DIMEGLUMINE 529 MG/ML IV SOLN COMPARISON:  Prior CT from 06/24/2017. FINDINGS: Brain: Diffuse prominence of  the CSF containing spaces compatible with generalized cerebral atrophy, advanced for age. Patchy T2/FLAIR hyperintensity involving the periventricular and deep white matter both cerebral hemispheres, most like related chronic microvascular ischemic disease, mild to moderate nature. Single tiny 4 mm focus of diffusion abnormality overlies the parasagittal left parieto-occipital region (series 3, image 30). Additional faint diffusion abnormality at the periventricular right basal ganglia (series 3, image 27). Few patchy areas of left meningeal FLAIR signal abnormality, most notable at the anterior left frontal lobe (series 8, image 15). (Scattered areas of patchy leptomeningeal enhancement, most evident overlying the left temporal lobe (series 13, image 14). Findings most likely related to known history of meningitis. No other frank signal changes to suggest associated encephalitis. No intraventricular debris or hydrocephalus. No other evidence for acute infarct. No evidence for acute intracranial hemorrhage. subcentimeter chronic microhemorrhage noted at the left frontal operculum (series 9, image 63). No mass lesion or midline shift. No extra-axial fluid collection. No other abnormal enhancement. Major dural sinuses are grossly patent. Pituitary gland suprasellar region within normal limits. Vascular: Major intracranial vascular flow voids are maintained. Skull and upper cervical spine: Craniocervical junction within normal limits. Multilevel degenerative spondylolysis noted within the visualized upper cervical spine without significant stenosis. Bone marrow signal somewhat diffusely decreased on T1 weighted sequence, suspected to be related to underlying chronic disease. No discrete osseous lesions. No scalp soft tissue abnormality. Sinuses/Orbits: Globes and orbital soft tissues within normal limits. Moderate mucosal thickening within the left ethmoidal air cells and maxillary sinus. Fluid layers within the  posterior nasopharynx. Nasogastric tube in place. Trace opacity left mastoid air cells, of doubtful significance. Inner ear structures normal. IMPRESSION: 1. Patchy areas of leptomeningeal signal abnormality and enhancement as above, consistent with known history of acute cryptococcal meningitis. No frank encephalitis or other complication identified. 2. Advanced cerebral atrophy for patient age with moderate chronic small vessel ischemic disease. 3. Moderate left-sided paranasal sinus disease. Electronically Signed   By: BJeannine BogaM.D.   On: 06/25/2017 22:17   Dg Chest Port 1 View  Result Date: 06/26/2017 CLINICAL DATA:  Acute onset of respiratory failure. Initial encounter. EXAM: PORTABLE CHEST 1 VIEW COMPARISON:  Chest radiograph performed 06/19/2017 FINDINGS: The patient's endotracheal tube is seen ending 2 cm above the carina. An enteric tube is noted extending below the diaphragm. The lungs are clear bilaterally. No focal consolidation, pleural effusion or pneumothorax is seen. The cardiomediastinal silhouette is normal in size. No acute osseous abnormalities are identified. IMPRESSION: 1. Endotracheal tube seen ending 2 cm above the carina. 2. Lungs clear bilaterally. Electronically Signed   By: JGarald BaldingM.D.   On: 06/26/2017 06:22     IMAGING/STUDIES: UDS 7/26:  Cocaine Positive UDS 7/31:  Cocaine Positive  CT CHEST W/ 8/1:  7 mm cavitary lesion is seen in the right middle lobe. It is uncertain if this represents cavitary metastatic lesion or small pulmonary abscess. Clinical correlation is recommended.  Follow-up CT scan in 6-8 weeks is recommended to determine stability or resolution. CT HEAD W/O 8/2:  Progression of generalized atrophy which may be due to HIV infection. Interval development of diffuse white matter disease in the periventricular white matter with focal hypodensity left inferior frontal lobe. This could be due to chronic ischemia however in the setting of HIV  also consider infection and less likely tumor. Follow-up MRI of the brain with contrast suggested. PORT CXR 8/3:  Previously reviewed by me. Endotracheal tube in good position. Enteric feeding tube going below diaphragm. No focal opacity.  CT HEAD W/O 8/8: IMPRESSION: 1. No acute intracranial abnormalities. 2. Volume loss greater than expected for patient's age. Mild chronic microvascular ischemic change. MRI BRAIN W/ & W/O 8/9: IMPRESSION: 1. Patchy areas of leptomeningeal signal abnormality and enhancement as above, consistent with known history of acute cryptococcal meningitis. No frank encephalitis or other complication identified. 2. Advanced cerebral atrophy for patient age with moderate chronic small vessel ischemic disease. 3. Moderate left-sided paranasal sinus disease. PORT CXR 8/10:  Personally reviewed by me. No focal opacity. Endotracheal tube in good position. An enteric feeding tube coursing below diaphragm. No pleural effusion.  MICROBIOLOGY: Quantiferon- TB 8/1:  Indeterminate Sputum AFB Culture 8/1 >>> (smear negative) SPutum AFB Culture 8/2 >>> (smear negative) Sputum Culture 8/2:  Negative Cryptococcal Ag Titer 8/2:  >1:2560  MRSA PCR 8/3:  Negative  Blood Cultures x2 8/2 >>> 2/2 Positive [place]ding Yeast BAL 8/3 >>>  Pseudomonas aeruginosa / C albican / AFB pending (smear negative) Cryptococcal Ag Titer 8/3:  >1:2560 CSF Culture 8/3: Cyrptococcus neoformans ABG Culture 8/5 >>> pending (smear negative) CSF Culture 8/6 >>> Yeast CSF Cryto Ag Titer 8/8:  2560 CSF Culture 8/8 >>> Yeast  ANTIBIOTICS: Vancomycin 8/3 - 8/5 Zosyn 8/3 - 8/5 Flucytosine 8/2 - 8/8 Azithromycin weekly  Bactrim DS daily Amphotericin B 8/2 >>> [name] 8/5 >>> (end date 8/12)  SIGNIFICANT EVENTS: 07/31 - Admit 08/03 - Bronchoscopy by DF w/ purulent secretions. LP w/ opening pressure 47cm H2O & closing pressure 17cm H2O after removing 10cc CSF 08/04 - LP opening pressure 38cm H2O &  closing pressure 7cm after 9cm  08/04 - LP opening pressure 14cm H2O & closing pressure 14cm H2O 08/06 - LP opening pressure 15cm H2O & closing pressure 6cm H2O - 28 mL removed 08/08 - Tolerated 1 hour of SBT/PS wean. Bedside LP by Dr. [place] Bells w/ change in eye exam >> opening pressure 9.5cm H2O & closing pressure 7cm H2O - 20 cc of clear CSF removed 08/09 - Still only tolerated 1 hour of SBT/PS wean  LINES/TUBES: Foley (out 8/10) OETT 8/3 >>> R NGT >>> PIV  ASSESSMENT / PLAN:  PULMONARY A: Acute hypoxic respiratory failure: Secondary to inability to protect airway. Improving. Right middle lobe cavitary nodule: Likely secondary to cryptococcal infection. Pseudomonas pneumonia  P:   Daily SBT & PS weaning May require tracheostomy Continuing full ventilator support Checking Port CXR in AM Checking ABG in AM  CARDIOVASCULAR A:  Essential hypertension: Blood pressure controlled.   P:  Continue telemetry monitoring Monitoring vitals per unit protocol Continue Norvasc Hydralazine IV when necessary   RENAL A:   Hyponatremia: Oscillating.  Hypomagnesemia: Replaced.  Hypokalemia: Replaced.    P:   Magnesium Sulfate 2gm IV Trending electrolytes daily Monitoring UOP  D/C Foley   GASTROINTESTINAL A:   Severe protein calorie malnutrition: Patient also has failure to thrive.  Constipation:  Resolved.  P:   Continuing Senna via  tube qhs Continuing tube feedings Continuing Protonix via tube daily  HEMATOLOGIC A:   Anemia: Stable Hgb. No signs of active bleeding.  Leukopenia:  Stable. Likely due to AIDS. Thrombocytopenia: Resolved. Likely secondary to sepsis.   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 Pseudomonas pneumonia  Cavitary right middle lobe nodule: Likely secondary to cryptococcal  infection. AIDS Hepatitis B viral infection   P:   Continuing antibiotics as above ID following and guiding antibiotics Awaiting finalization of cultures and infectious workup   NEUROLOGIC A:   Cranial Nerve Palsy Cryptococcal meningitis Increased CSF/intracranial pressure: Improving. Undergoing serial lumbar punctures. IR & neurosurgery refused to place lumbar drain. Goal opening pressure <18 cm H2O.   P:   Desired RASS:  0 to -1 Intermittent Lumbar Puncture Intermittent sedation   Endocrine A: No acute issues.  P: Monitoring glucose with labs daily.   FAMILY  - Updates:  Sisters updated 8/6 at bedside by Dr. Ashok Cordia. No family at bedside today.   DISCUSSION:  57 y.o. male with disseminated cryptococcal infection. Left eyelid ptosis persists. Still has a cranial nerve palsy. Respiratory status progressively improving. Hopefully we will be able to extubate in the next 48-72 hours. Appreciate assistance from ID.   I have spent a total of 31 minutes of critical care time today caring for the patient and reviewing the patient's electronic medical record.   Sonia Baller Ashok Cordia, M.D. [facility]monary [facility] Critical [facility] Pager:  [phone] After 3pm or if no response, call [phone] 06/26/2017  12:49 PM

## 2017-06-26 NOTE — Progress Notes (Signed)
      INFECTIOUS DISEASE ATTENDING ADDENDUM:   Date: 06/26/2017  Patient name: Kenneth Rivas  Medical record number: [id number]  Date of birth: 19-Feb-1961    This patient has been seen and discussed with the house staff. Please see the resident's note for complete details. I concur with their findings with the following additions/corrections:  Kenneth Rivas is not able to be weaned from the ventilator suspect he will require tracheostomy. His ptosis is worse  Continue AmBisome to complete 2 weeks and then go to fluconazole.  He should have an MRI of the brain with contrast if possible to evaluate cause of his cranial nerve palsy and ptosis  He remains alert and oriented and follows commands. He denies headaches.  Would get another lumbar puncture on Monday or Tuesday if he continues to do well from Neuro standpoint  ARV's will need to be delayed for 5 weeks.  Paulette BlanchCornelius Van Dam 06/26/2017, 4:19 PM

## 2017-06-26 NOTE — Progress Notes (Signed)
[facility]  Date of Admission:  06/16/2017   Total days of antibiotics 9        Day 9 amphotericine        Ceftazidime stopped 8/9         ASSESSMENT and PLAN:  Disseminated cryptococcal disease- Meningitis and Pneumonia:  Patient continues to appear lethargic but is responsive to questions and commands. His last LP had an opening pressure of 9.5 which is reassuring.  -Consider MRI with contrast due to left eyelid droop and left eye deviation.  -Continue induction therapy with Amphotericin to treat cryptococcal meningitis and pneumonia for 2 weeks. Will follow with consolidation therapy with fluconzole qd for 8 weeks. -Renal function remains unchanged with cr=0.54 (8/9) -Stop ceftazidime  -next LP on 8/13 or 8/14  HIV/AIDS - Will not start antiretroviral therapy due to risk of IRIS. Will consider in 5 weeks. -Continue azithromycin and bactrim as patient is on ART    . acetaminophen (TYLENOL) oral liquid 160 mg/5 mL  650 mg Per Tube Q6H  . amLODipine  5 mg Per Tube Daily  . azithromycin  1,200 mg Per Tube Weekly  . chlorhexidine gluconate (MEDLINE KIT)  15 mL Mouth Rinse BID  . dextrose  10 mL Intravenous Q24H  . dextrose  10 mL Intravenous Q24H  . heparin subcutaneous  5,000 Units Subcutaneous Q8H  . mouth rinse  15 mL Mouth Rinse QID  . pantoprazole sodium  40 mg Per Tube Daily  . sennosides  5 mL Per Tube QHS  . sulfamethoxazole-trimethoprim  1 tablet Per Tube Daily    SUBJECTIVE: Mr. Bulman was asleep in his room. His ptosis seems to have worsened and he seems more somnolent although he is responsive to questions. He denied any headaches, sob, or chest pain.   Review of Systems: ROS as above  No Active Allergies  OBJECTIVE: Vitals:   06/26/17 1400 06/26/17 1500 06/26/17 1515 06/26/17 1555  BP: 133/83 (!) 141/86 (!) 141/86   Pulse: 74 70 77   Resp: _0 Temp:    98.2 F (36.8 C)  TempSrc:    Oral    SpO2: 100% 100% 100%   Weight:      Height:       Body mass index is 18.22 kg/m.  Physical Exam  Constitutional: He appears unhealthy. He has a sickly appearance. He appears distressed.  HENT:  Head: Normocephalic and atraumatic.  Eyes:  Left eye ptosis   Cardiovascular: Normal rate, regular rhythm, normal heart sounds and intact distal pulses.   Pulmonary/Chest: He is in respiratory distress. He has no rales.  Abdominal: He exhibits no distension. There is no tenderness.    Lab Results Lab Results  Component Value Date   WBC 1.5 (L) 06/26/2017   HGB 8.4 (L) 06/26/2017   HCT 25.1 (L) 06/26/2017   MCV 80.4 06/26/2017   PLT 174 06/26/2017    Lab Results  Component Value Date   CREATININE 0.53 (L) 06/26/2017   BUN 6 06/26/2017   NA 130 (L) 06/26/2017   K 3.9 06/26/2017   CL 102 06/26/2017   CO2 24 06/26/2017    Lab Results  Component Value Date   ALT 13 (L) 06/16/2017   AST 19 06/16/2017   ALKPHOS 58 06/16/2017   BILITOT 0.8 06/16/2017     Microbiology: Recent Results (from the past 240 hour(s))  Acid Fast Smear (AFB)  Status: None   Collection Time: 06/17/17 11:29 PM  Result Value Ref Range Status   AFB Specimen Processing Concentration  Final   Acid Fast Smear Negative  Final    Comment: (NOTE) Performed At: [facility] [address] [place], [place] [id number] Lindon Romp MD AS:[id number]    Source (AFB) SPUTUM  Final  Culture, expectorated sputum-assessment     Status: None   Collection Time: 06/18/17  5:00 AM  Result Value Ref Range Status   Specimen Description SPUTUM  Final   Special Requests Immunocompromised  Final   Sputum evaluation   Final    Sputum specimen not acceptable for testing.  Please recollect.   Gram Stain Report Called to,Read Back By and Verified With: RN Anna Genre [phone] MLM    Report Status 06/18/2017 FINAL  Final  Acid Fast Smear (AFB)     Status: None   Collection Time: 06/18/17  5:00 AM  Result  Value Ref Range Status   AFB Specimen Processing Concentration  Final   Acid Fast Smear Negative  Final    Comment: (NOTE) Performed At: [facility] [address] [place], [place] [id number] Lindon Romp MD GY:[id number]    Source (AFB) SPUTUM  Final  Culture, blood (Routine X 2) w Reflex to ID Panel     Status: Abnormal   Collection Time: 06/18/17  9:35 AM  Result Value Ref Range Status   Specimen Description BLOOD LEFT HAND  Final   Special Requests   Final    BOTTLES DRAWN AEROBIC ONLY Blood Culture results may not be optimal due to an inadequate volume of blood received in culture bottles   Culture  Setup Time   Final    BUDDING YEAST SEEN AEROBIC BOTTLE ONLY CRITICAL RESULT CALLED TO, READ BACK BY AND VERIFIED WITH: N JOHNSTON 06/22/17 @ 1028 M VESTAL    Culture (A)  Final    CRYPTOCOCCUS NEOFORMANS CRITICAL RESULT CALLED TO, READ BACK BY AND VERIFIED WITH: Mellody Memos, RN AT 1500 ON 06/24/17 BY C. JESSUP, MLT.    Report Status 06/24/2017 FINAL  Final  Blood Culture ID Panel (Reflexed)     Status: None   Collection Time: 06/18/17  9:35 AM  Result Value Ref Range Status   Enterococcus species NOT DETECTED NOT DETECTED Final   Listeria monocytogenes NOT DETECTED NOT DETECTED Final   Staphylococcus species NOT DETECTED NOT DETECTED Final   Staphylococcus aureus NOT DETECTED NOT DETECTED Final   Streptococcus species NOT DETECTED NOT DETECTED Final   Streptococcus agalactiae NOT DETECTED NOT DETECTED Final   Streptococcus pneumoniae NOT DETECTED NOT DETECTED Final   Streptococcus pyogenes NOT DETECTED NOT DETECTED Final   Acinetobacter baumannii NOT DETECTED NOT DETECTED Final   Enterobacteriaceae species NOT DETECTED NOT DETECTED Final   Enterobacter cloacae complex NOT DETECTED NOT DETECTED Final   Escherichia coli NOT DETECTED NOT DETECTED Final   Klebsiella oxytoca NOT DETECTED NOT DETECTED Final   Klebsiella pneumoniae NOT DETECTED NOT DETECTED Final   Proteus  species NOT DETECTED NOT DETECTED Final   Serratia marcescens NOT DETECTED NOT DETECTED Final   Haemophilus influenzae NOT DETECTED NOT DETECTED Final   Neisseria meningitidis NOT DETECTED NOT DETECTED Final   Pseudomonas aeruginosa NOT DETECTED NOT DETECTED Final   Candida albicans NOT DETECTED NOT DETECTED Final   Candida glabrata NOT DETECTED NOT DETECTED Final   Candida krusei NOT DETECTED NOT DETECTED Final   Candida parapsilosis NOT DETECTED NOT DETECTED Final  Candida tropicalis NOT DETECTED NOT DETECTED Final  Culture, blood (Routine X 2) w Reflex to ID Panel     Status: Abnormal   Collection Time: 06/18/17  9:36 AM  Result Value Ref Range Status   Specimen Description BLOOD LEFT ARM  Final   Special Requests   Final    BOTTLES DRAWN AEROBIC ONLY Blood Culture results may not be optimal due to an inadequate volume of blood received in culture bottles   Culture  Setup Time   Final    BUDDING YEAST SEEN AEROBIC BOTTLE ONLY CRITICAL RESULT CALLED TO, READ BACK BY AND VERIFIED WITH: N JOHNSTON 06/22/17 @ 1028 M VESTAL    Culture (A)  Final    CRYPTOCOCCUS NEOFORMANS CRITICAL RESULT CALLED TO, READ BACK BY AND VERIFIED WITH: Mellody Memos, RN AT 1500 ON 06/24/17 BY C. JESSUP, MLT.    Report Status 06/24/2017 FINAL  Final  MRSA PCR Screening     Status: None   Collection Time: 06/19/17 12:18 PM  Result Value Ref Range Status   MRSA by PCR NEGATIVE NEGATIVE Final    Comment:        The GeneXpert MRSA Assay (FDA approved for NASAL specimens only), is one component of a comprehensive MRSA colonization surveillance program. It is not intended to diagnose MRSA infection nor to guide or monitor treatment for MRSA infections.   Acid Fast Smear (AFB)     Status: None   Collection Time: 06/19/17  1:05 PM  Result Value Ref Range Status   AFB Specimen Processing Concentration  Final   Acid Fast Smear Negative  Final    Comment: (NOTE) Performed At: [facility] [place], [place] [id number] Lindon Romp MD [id number]    Source (AFB) BRONCHIAL ALVEOLAR LAVAGE  Final  Acid Fast Smear (AFB)     Status: None   Collection Time: 06/19/17  1:05 PM  Result Value Ref Range Status   AFB Specimen Processing Concentration  Final   Acid Fast Smear Negative  Final    Comment: (NOTE) Performed At: [facility] [place], [place] [id number] Lindon Romp MD [id number]    Source (AFB) BRONCHIAL ALVEOLAR LAVAGE  Final  Culture, fungus without smear     Status: Abnormal (Preliminary result)   Collection Time: 06/19/17  1:05 PM  Result Value Ref Range Status   Specimen Description BRONCHIAL ALVEOLAR LAVAGE  Final   Special Requests Immunocompromised  Final   Culture CANDIDA ALBICANS (A)  Final   Report Status PENDING  Incomplete  Pneumocystis smear by DFA     Status: None   Collection Time: 06/19/17  1:05 PM  Result Value Ref Range Status   Specimen Source-PJSRC BRONCHIAL ALVEOLAR LAVAGE  Final   Pneumocystis jiroveci Ag NEGATIVE  Final    Comment: Performed at [facility]  Culture, bal-quantitative     Status: Abnormal   Collection Time: 06/19/17  1:05 PM  Result Value Ref Range Status   Specimen Description BRONCHIAL ALVEOLAR LAVAGE  Final   Special Requests NONE  Final   Gram Stain   Final    ABUNDANT WBC PRESENT, PREDOMINANTLY PMN MODERATE GRAM VARIABLE ROD MODERATE GRAM POSITIVE COCCI IN PAIRS RARE BUDDING YEAST SEEN    Culture 20,000 COLONIES/mL PSEUDOMONAS AERUGINOSA (A)  Final   Report Status 06/21/2017 FINAL  Final   Organism ID, Bacteria PSEUDOMONAS AERUGINOSA (A)  Final      Susceptibility   Pseudomonas aeruginosa -  MIC*    CEFTAZIDIME 4 SENSITIVE Sensitive     CIPROFLOXACIN <=0.25 SENSITIVE Sensitive     GENTAMICIN <=1 SENSITIVE Sensitive     IMIPENEM 1 SENSITIVE Sensitive     PIP/TAZO 8 SENSITIVE Sensitive     CEFEPIME 2 SENSITIVE Sensitive     * 20,000 COLONIES/mL PSEUDOMONAS  AERUGINOSA  CSF culture with Stat gram stain     Status: None   Collection Time: 06/19/17  1:30 PM  Result Value Ref Range Status   Specimen Description CSF  Final   Special Requests NONE  Final   Gram Stain   Final    CYTOSPIN SMEAR WBC PRESENT, PREDOMINANTLY MONONUCLEAR YEAST CRITICAL RESULT CALLED TO, READ BACK BY AND VERIFIED WITH: Jessie Foot RN AT 0345 ON [id number] BY SJW    Culture FEW CRYPTOCOCCUS NEOFORMANS  Final   Report Status 06/22/2017 FINAL  Final  Fungus Culture With Stain     Status: None (Preliminary result)   Collection Time: 06/19/17  1:30 PM  Result Value Ref Range Status   Fungus Stain Final report  Final   Fungus (Mycology) Culture Preliminary report  Final    Comment: (NOTE) Performed At: [facility] [place], [place] [id number] Lindon Romp MD [id number]    Fungal Source PENDING  Incomplete  Fungus Culture Result     Status: None   Collection Time: 06/19/17  1:30 PM  Result Value Ref Range Status   Result 1 Comment  Final    Comment: (NOTE) Fungal elements, such as arthroconidia, hyphal fragments, chlamydoconidia, observed. POSITIVE SMEAR REPORTED TO LESLEY B. AT 1115 ON 05/22/17 BY AM Performed At: [facility] [place], [place] [id number] Lindon Romp MD BR:[id number]   Fungal organism reflex     Status: None   Collection Time: 06/19/17  1:30 PM  Result Value Ref Range Status   Fungal result 1 Cryptococcus species  Corrected    Comment: (NOTE) Identification to follow. PRELIMINARY POSITIVE CULTURE REPORT CALLED TO MATTHEW V. 06-23-17 AT 1105, FAX TO [phone]   KMP PRELIMINARY IDENTIFICATION REPORT CALLED TO MATTHEW V. 06-23-17 AT 1422, FAX WAS NOT REQUESTED   KMP Performed At: [facility] [place], [place] [id number] Lindon Romp MD [id number] CORRECTED ON 08/07 AT 1435: PREVIOUSLY REPORTED AS Comment   Acid Fast Smear (AFB)     Status: None   Collection  Time: 06/21/17 12:12 PM  Result Value Ref Range Status   AFB Specimen Processing Concentration  Final   Acid Fast Smear Negative  Final    Comment: (NOTE) Performed At: [facility] [place], [place] [id number] Lindon Romp MD [id number]    Source (AFB) TRACHEAL ASPIRATE  Final  CSF culture     Status: Abnormal   Collection Time: 06/22/17  4:25 PM  Result Value Ref Range Status   Specimen Description CSF  Final   Special Requests NONE  Final   Gram Stain   Final    CYTOSPIN SMEAR WBC PRESENT,BOTH PMN AND MONONUCLEAR ENCAPSULATED YEAST SEEN CRITICAL RESULT CALLED TO, READ BACK BY AND VERIFIED WITH: Heath Lark RN 17:10 06/22/17 ([email or web address])    Culture (A)  Final    CRYPTOCOCCUS NEOFORMANS CRITICAL VALUE NOTED.  VALUE IS CONSISTENT WITH PREVIOUSLY REPORTED AND CALLED VALUE.    Report Status 06/26/2017 FINAL  Final  Fungus Culture With Stain     Status: None (Preliminary result)   Collection Time: 06/22/17  4:25 PM  Result Value Ref Range Status   Fungus Stain Final report  Final    Comment: (NOTE) Performed At: [facility] [place], [place] [id number] Lindon Romp MD [id number]    Fungus (Mycology) Culture PENDING  Incomplete   Fungal Source CSF  Final  Fungus Culture Result     Status: None   Collection Time: 06/22/17  4:25 PM  Result Value Ref Range Status   Result 1 Comment  Final    Comment: (NOTE) KOH/Calcofluor preparation:  no fungus observed. Performed At: [facility] [place], [place] [id number] Lindon Romp MD [id number]   Culture, fungus without smear     Status: None (Preliminary result)   Collection Time: 06/24/17  5:35 PM  Result Value Ref Range Status   Specimen Description CSF  Final   Special Requests NONE  Final   Culture NO FUNGUS ISOLATED AFTER 2 DAYS  Final   Report Status PENDING  Incomplete    Lars Mage, MD [facility] [phone] pager   [phone] cell 06/26/2017, 5:32 PM

## 2017-06-26 NOTE — Progress Notes (Signed)
Saw and examined [name] this morning.  Continued left eye droop, MRI showing leptomeningeal signal abnormality and enhancement consistent with cryptococcal meningitis.  Possible tracheostomy depending on ability to wean off of vent.  Interactive with me this morning, consistent with previous mornings.  We appreciate the excellent care provided by the CCM team and will be happy to resume care after patient is transferred to our service.

## 2017-06-26 NOTE — Progress Notes (Signed)
Pharmacy Antibiotic Note  Kenneth Rivas is a 56 y.o. male admitted on 06/16/2017 with AIDS and failure to thrive. Pt with acute cryptococcal bloodstream infection also with cavitary pulmonary nodule. Bactrim and azithromycin for PCP/MAC prophylaxis respectively due to CD4 count of 2.  Vancomycin and Zosyn were added for pneumonia during her stay, then narrowed to ceftazidime to cover the pseudomonas cultured in the BAL. 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 Flucytosine was stopped secondary to leukopenia. After completion of the amphotericin b he will be transitioned to fluconazole for 8 weeks.  Plan: -Continue ceftazidime with end date of 8/12 -Continue Amphotericin B until 8/16 -Monitor renal fx, cultures, Mg & K -F/u LP and cultures   Height: _0  (167.6 cm) Weight: 112 lb 14 oz (51.2 kg) IBW/kg (Calculated) : 63.8  Temp (24hrs), Avg:98 F (36.7 C), Min:97.8 F (36.6 C), Max:98.5 F (36.9 C)   Recent Labs Lab 06/22/17 0548 06/22/17 1643 06/23/17 0230 06/24/17 0228 06/25/17 0341 06/26/17 0208  WBC 2.9*  --  2.1* 1.5* 1.5* 1.5*  CREATININE 0.64 0.56* 0.53* 0.53* 0.54* 0.53*     Antimicrobials this admission: 8/5 Ceftaz>> [8/12] 8/2 AmphB>> [8/16->then fluc for 8wks] 8/2 Flucytosine>>08/08 d/c'ed per ID d/t leukopenia 8/3 Zosyn >>8/5 8/3 Vanc >>8/5 8/4 Bactrim >> 8/4 Azithromycin >>  Microbiology results: 8/6 CSF: encapsulated yeast 8/3 Pneumocystis smear: negative 8/3 AFB: NEG 8/3 CSF - yeast (cryptococcus neoformans) 8/3 MRSA - NEG 8/3 BAL: yeast; pseudomonas (pansensitive) 8/2 blood cx: crypto neoformans 8/2 sputum: not acceptable for testing 8/2 Crypto: positive   Kenneth Rivas PharmD PGY1 Pharmacy Practice Resident 06/26/2017 12:22 PM Pager: [phone]

## 2017-06-27 LAB — CBC WITH DIFFERENTIAL/PLATELET
Basophils Absolute: 0.1 10*3/uL (ref 0.0–0.1)
Basophils Relative: 4 %
EOS PCT: 6 %
Eosinophils Absolute: 0.1 10*3/uL (ref 0.0–0.7)
HEMATOCRIT: 25.7 % — AB (ref 39.0–52.0)
HEMOGLOBIN: 8.4 g/dL — AB (ref 13.0–17.0)
LYMPHS ABS: 1.1 10*3/uL (ref 0.7–4.0)
LYMPHS PCT: 43 %
MCH: 26.3 pg (ref 26.0–34.0)
MCHC: 32.7 g/dL (ref 30.0–36.0)
MCV: 80.6 fL (ref 78.0–100.0)
MONOS PCT: 10 %
Monocytes Absolute: 0.2 10*3/uL (ref 0.1–1.0)
Neutro Abs: 0.9 10*3/uL — ABNORMAL LOW (ref 1.7–7.7)
Neutrophils Relative %: 37 %
Platelets: 184 10*3/uL (ref 150–400)
RBC: 3.19 MIL/uL — AB (ref 4.22–5.81)
RDW: 14.3 % (ref 11.5–15.5)
WBC: 2.4 10*3/uL — AB (ref 4.0–10.5)

## 2017-06-27 LAB — PHOSPHORUS: Phosphorus: 2.9 mg/dL (ref 2.5–4.6)

## 2017-06-27 LAB — HEPATIC FUNCTION PANEL
ALBUMIN: 2.2 g/dL — AB (ref 3.5–5.0)
ALK PHOS: 77 U/L (ref 38–126)
ALT: 47 U/L (ref 17–63)
AST: 53 U/L — AB (ref 15–41)
BILIRUBIN TOTAL: 0.2 mg/dL — AB (ref 0.3–1.2)
Bilirubin, Direct: <0.1 mg/dL — ABNORMAL LOW (ref 0.1–0.5)
TOTAL PROTEIN: 7.4 g/dL (ref 6.5–8.1)

## 2017-06-27 LAB — BASIC METABOLIC PANEL
ANION GAP: 6 (ref 5–15)
BUN: 7 mg/dL (ref 6–20)
CHLORIDE: 102 mmol/L (ref 101–111)
CO2: 24 mmol/L (ref 22–32)
Calcium: 8.2 mg/dL — ABNORMAL LOW (ref 8.9–10.3)
Creatinine, Ser: 0.53 mg/dL — ABNORMAL LOW (ref 0.61–1.24)
GFR calc non Af Amer: >60 mL/min (ref >60)
Glucose, Bld: 103 mg/dL — ABNORMAL HIGH (ref 65–99)
POTASSIUM: 3.7 mmol/L (ref 3.5–5.1)
Sodium: 132 mmol/L — ABNORMAL LOW (ref 135–145)

## 2017-06-27 LAB — MAGNESIUM: Magnesium: 1.6 mg/dL — ABNORMAL LOW (ref 1.7–2.4)

## 2017-06-27 MED ORDER — CHLORHEXIDINE GLUCONATE 0.12 % MT SOLN
15.0000 mL | Freq: Two times a day (BID) | OROMUCOSAL | Status: DC
  Administered 2017-06-27 – 2017-07-09 (×14): 15 mL via OROMUCOSAL
  Filled 2017-06-27 (×22): qty 15

## 2017-06-27 MED ORDER — ORAL CARE MOUTH RINSE
15.0000 mL | Freq: Two times a day (BID) | OROMUCOSAL | Status: DC
  Administered 2017-06-28 – 2017-07-05 (×5): 15 mL via OROMUCOSAL

## 2017-06-27 MED ORDER — POTASSIUM CHLORIDE 20 MEQ/15ML (10%) PO SOLN
20.0000 meq | ORAL | Status: AC
  Administered 2017-06-27 (×2): 20 meq
  Filled 2017-06-27 (×2): qty 15

## 2017-06-27 MED ORDER — MAGNESIUM SULFATE 2 GM/50ML IV SOLN
2.0000 g | Freq: Once | INTRAVENOUS | Status: AC
  Administered 2017-06-27: 2 g via INTRAVENOUS
  Filled 2017-06-27: qty 50

## 2017-06-27 NOTE — Progress Notes (Signed)
Subjective: No new complaints besides his ptosis he is very happy to be off the ventilator   Antibiotics:  Anti-infectives    Start     Dose/Rate Route Frequency Ordered Stop   06/23/17 1600  Oritavancin Diphosphate (ORBACTIV) 1,200 mg in dextrose 5 % IVPB  Status:  Discontinued     1,200 mg 333.3 mL/hr over 180 Minutes Intravenous Once 06/23/17 1556 06/23/17 1557   06/22/17 2000  amphotericin B liposome (AMBISOME) 200 mg in dextrose 5 % 500 mL IVPB     4 mg/kg  49.5 kg 250 mL/hr over 120 Minutes Intravenous Every 24 hours 06/22/17 1020     06/22/17 1200  flucytosine (ANCOBON) capsule 1,250 mg  Status:  Discontinued     25 mg/kg  49.5 kg Per Tube Every 6 hours 06/22/17 1020 06/22/17 1024   06/22/17 1200  flucytosine (ANCOBON) capsule 1,250 mg  Status:  Discontinued     1,250 mg Per Tube Every 6 hours 06/22/17 1024 06/24/17 0912   06/21/17 1400  cefTAZidime (FORTAZ) 1 g in dextrose 5 % 50 mL IVPB     1 g 100 mL/hr over 30 Minutes Intravenous Every 8 hours 06/21/17 1230 06/28/17 1400   06/21/17 0000  flucytosine (ANCOBON) capsule 1,000 mg  Status:  Discontinued     25 mg/kg  44.2 kg Per Tube Every 6 hours 06/20/17 2349 06/22/17 1020   06/20/17 1000  sulfamethoxazole-trimethoprim (BACTRIM DS,SEPTRA DS) 800-160 MG per tablet 1 tablet     1 tablet Per Tube Daily 06/19/17 1439     06/20/17 1000  azithromycin (ZITHROMAX) tablet 1,200 mg     1,200 mg Per Tube Weekly 06/19/17 1439     06/20/17 0500  vancomycin (VANCOCIN) 500 mg in sodium chloride 0.9 % 100 mL IVPB  Status:  Discontinued     500 mg 100 mL/hr over 60 Minutes Intravenous Every 12 hours 06/19/17 1340 06/21/17 1157   06/19/17 2000  piperacillin-tazobactam (ZOSYN) IVPB 3.375 g  Status:  Discontinued     3.375 g 12.5 mL/hr over 240 Minutes Intravenous Every 8 hours 06/19/17 1340 06/21/17 1157   06/19/17 2000  amphotericin B liposome (AMBISOME) 180 mg in dextrose 5 % 500 mL IVPB  Status:  Discontinued     4 mg/kg   44.2 kg 250 mL/hr over 120 Minutes Intravenous Every 24 hours 06/19/17 1509 06/22/17 1020   06/19/17 1400  vancomycin (VANCOCIN) IVPB 1000 mg/200 mL premix     1,000 mg 200 mL/hr over 60 Minutes Intravenous  Once 06/19/17 1340 06/19/17 1627   06/19/17 1400  piperacillin-tazobactam (ZOSYN) IVPB 3.375 g     3.375 g 100 mL/hr over 30 Minutes Intravenous  Once 06/19/17 1340 06/19/17 1556   06/18/17 2000  amphotericin B liposome (AMBISOME) 180 mg in dextrose 5 % 500 mL IVPB  Status:  Discontinued     4 mg/kg  44.2 kg 250 mL/hr over 120 Minutes Intravenous Every 24 hours 06/18/17 1834 06/19/17 1509   06/18/17 1915  amphotericin B liposome (AMBISOME) 180 mg in dextrose 5 % 500 mL IVPB  Status:  Discontinued     4 mg/kg  44.2 kg 250 mL/hr over 120 Minutes Intravenous Every 24 hours 06/18/17 1813 06/18/17 1834   06/18/17 1815  flucytosine (ANCOBON) capsule 1,000 mg  Status:  Discontinued     25 mg/kg  44.2 kg Oral Every 6 hours 06/18/17 1813 06/20/17 2349      Medications: Scheduled Meds: . amLODipine  5 mg Per Tube Daily  . azithromycin  1,200 mg Per Tube Weekly  . chlorhexidine gluconate (MEDLINE KIT)  15 mL Mouth Rinse BID  . dextrose  10 mL Intravenous Q24H  . dextrose  10 mL Intravenous Q24H  . heparin subcutaneous  5,000 Units Subcutaneous Q8H  . mouth rinse  15 mL Mouth Rinse QID  . pantoprazole sodium  40 mg Per Tube Daily  . sennosides  5 mL Per Tube QHS  . sulfamethoxazole-trimethoprim  1 tablet Per Tube Daily   Continuous Infusions: . sodium chloride 75 mL/hr at 06/27/17 0355  . amphotericin  B  Liposome (AMBISOME) ADULT IV Stopped (06/26/17 2212)  . cefTAZidime (FORTAZ)  IV Stopped (06/27/17 0602)  . feeding supplement (VITAL AF 1.2 CAL) 1,000 mL (06/26/17 1829)  . sodium chloride Stopped (06/26/17 2015)  . sodium chloride 0 mL (06/21/17 2321)   PRN Meds:.diphenhydrAMINE, fentaNYL (SUBLIMAZE) injection, hydrALAZINE, midazolam    Objective: Weight change: -1 lb 5.2  oz (-0.6 kg)  Intake/Output Summary (Last 24 hours) at 06/27/17 1553 Last data filed at 06/27/17 1200  Gross per 24 hour  Intake             3775 ml  Output             2275 ml  Net             1500 ml   Blood pressure 135/81, pulse (!) 1, temperature 98.1 F (36.7 C), temperature source Oral, resp. rate 20, height _0  (1.676 m), weight 111 lb 8.8 oz (50.6 kg), SpO2 100 %. Temp:  [98.1 F (36.7 C)-98.4 F (36.9 C)] 98.1 F (36.7 C) (08/11 1216) Pulse Rate:  [1-80] 1 (08/11 1216) Resp:  [12-23] 20 (08/11 1200) BP: (118-152)/(79-94) 135/81 (08/11 1200) SpO2:  [98 %-100 %] 100 % (08/11 1357) FiO2 (%):  [30 %] 30 % (08/11 1200) Weight:  [111 lb 8.8 oz (50.6 kg)] 111 lb 8.8 oz (50.6 kg) (08/11 0143)  Physical Exam: General: Alert and awake, oriented x3, not in any acute distress. HEENT: anicteric sclera, pupils reactive to light and accommodation left ptosis and eye deviation CVS regular rate, normal r,  no murmur rubs or gallops Chest: fairly clear to auscultation anteriorly  Abdomen: soft nontender, nondistended, normal bowel sounds, Extremities: no  clubbing or edema noted bilaterally Skin: no rashes Neuro: nonfocal  CBC: CBC Latest Ref Rng & Units 06/27/2017 06/26/2017 06/25/2017  WBC 4.0 - 10.5 K/uL 2.4(L) 1.5(L) 1.5(L)  Hemoglobin 13.0 - 17.0 g/dL 8.4(L) 8.4(L) 9.1(L)  Hematocrit 39.0 - 52.0 % 25.7(L) 25.1(L) 27.0(L)  Platelets 150 - 400 K/uL 184 174 169      BMET  Recent Labs  06/26/17 0208 06/27/17 0222  NA 130* 132*  K 3.9 3.7  CL 102 102  CO2 24 24  GLUCOSE 103* 103*  BUN 6 7  CREATININE 0.53* 0.53*  CALCIUM 8.2* 8.2*     Liver Panel   Recent Labs  06/27/17 1330  PROT 7.4  ALBUMIN 2.2*  AST 53*  ALT 47  ALKPHOS 77  BILITOT 0.2*  BILIDIR <0.1*  IBILI NOT CALCULATED       Sedimentation Rate No results for input(s): ESRSEDRATE in the last 72 hours. C-Reactive Protein No results for input(s): CRP in the last 72 hours.  Micro  Results: Recent Results (from the past 720 hour(s))  Acid Fast Smear (AFB)     Status: None   Collection Time: 06/17/17 11:29 PM  Result Value  Ref Range Status   AFB Specimen Processing Concentration  Final   Acid Fast Smear Negative  Final    Comment: (NOTE) Performed At: [facility] [place], [place] [id number] Lindon Romp MD PR:[id number]    Source (AFB) SPUTUM  Final  Culture, expectorated sputum-assessment     Status: None   Collection Time: 06/18/17  5:00 AM  Result Value Ref Range Status   Specimen Description SPUTUM  Final   Special Requests Immunocompromised  Final   Sputum evaluation   Final    Sputum specimen not acceptable for testing.  Please recollect.   Gram Stain Report Called to,Read Back By and Verified With: RN Anna Genre [phone] [id number] MLM    Report Status 06/18/2017 FINAL  Final  Acid Fast Smear (AFB)     Status: None   Collection Time: 06/18/17  5:00 AM  Result Value Ref Range Status   AFB Specimen Processing Concentration  Final   Acid Fast Smear Negative  Final    Comment: (NOTE) Performed At: [facility] [address] [place], [place] [id number] Lindon Romp MD PQ:[id number]    Source (AFB) SPUTUM  Final  Culture, blood (Routine X 2) w Reflex to ID Panel     Status: Abnormal   Collection Time: 06/18/17  9:35 AM  Result Value Ref Range Status   Specimen Description BLOOD LEFT HAND  Final   Special Requests   Final    BOTTLES DRAWN AEROBIC ONLY Blood Culture results may not be optimal due to an inadequate volume of blood received in culture bottles   Culture  Setup Time   Final    BUDDING YEAST SEEN AEROBIC BOTTLE ONLY CRITICAL RESULT CALLED TO, READ BACK BY AND VERIFIED WITH: N JOHNSTON 06/22/17 @ 1028 M VESTAL    Culture (A)  Final    CRYPTOCOCCUS NEOFORMANS CRITICAL RESULT CALLED TO, READ BACK BY AND VERIFIED WITH: Mellody Memos, RN AT 1500 ON 06/24/17 BY C. JESSUP, MLT.    Report Status 06/24/2017 FINAL  Final   Blood Culture ID Panel (Reflexed)     Status: None   Collection Time: 06/18/17  9:35 AM  Result Value Ref Range Status   Enterococcus species NOT DETECTED NOT DETECTED Final   Listeria monocytogenes NOT DETECTED NOT DETECTED Final   Staphylococcus species NOT DETECTED NOT DETECTED Final   Staphylococcus aureus NOT DETECTED NOT DETECTED Final   Streptococcus species NOT DETECTED NOT DETECTED Final   Streptococcus agalactiae NOT DETECTED NOT DETECTED Final   Streptococcus pneumoniae NOT DETECTED NOT DETECTED Final   Streptococcus pyogenes NOT DETECTED NOT DETECTED Final   Acinetobacter baumannii NOT DETECTED NOT DETECTED Final   Enterobacteriaceae species NOT DETECTED NOT DETECTED Final   Enterobacter cloacae complex NOT DETECTED NOT DETECTED Final   Escherichia coli NOT DETECTED NOT DETECTED Final   Klebsiella oxytoca NOT DETECTED NOT DETECTED Final   Klebsiella pneumoniae NOT DETECTED NOT DETECTED Final   Proteus species NOT DETECTED NOT DETECTED Final   Serratia marcescens NOT DETECTED NOT DETECTED Final   Haemophilus influenzae NOT DETECTED NOT DETECTED Final   Neisseria meningitidis NOT DETECTED NOT DETECTED Final   Pseudomonas aeruginosa NOT DETECTED NOT DETECTED Final   Candida albicans NOT DETECTED NOT DETECTED Final   Candida glabrata NOT DETECTED NOT DETECTED Final   Candida krusei NOT DETECTED NOT DETECTED Final   Candida parapsilosis NOT DETECTED NOT DETECTED Final   Candida tropicalis NOT DETECTED NOT DETECTED Final  Culture, blood (Routine X  2) w Reflex to ID Panel     Status: Abnormal   Collection Time: 06/18/17  9:36 AM  Result Value Ref Range Status   Specimen Description BLOOD LEFT ARM  Final   Special Requests   Final    BOTTLES DRAWN AEROBIC ONLY Blood Culture results may not be optimal due to an inadequate volume of blood received in culture bottles   Culture  Setup Time   Final    BUDDING YEAST SEEN AEROBIC BOTTLE ONLY CRITICAL RESULT CALLED TO, READ BACK BY  AND VERIFIED WITH: N JOHNSTON 06/22/17 @ 1028 M VESTAL    Culture (A)  Final    CRYPTOCOCCUS NEOFORMANS CRITICAL RESULT CALLED TO, READ BACK BY AND VERIFIED WITH: Mellody Memos, RN AT 1500 ON 06/24/17 BY C. JESSUP, MLT.    Report Status 06/24/2017 FINAL  Final  MRSA PCR Screening     Status: None   Collection Time: 06/19/17 12:18 PM  Result Value Ref Range Status   MRSA by PCR NEGATIVE NEGATIVE Final    Comment:        The GeneXpert MRSA Assay (FDA approved for NASAL specimens only), is one component of a comprehensive MRSA colonization surveillance program. It is not intended to diagnose MRSA infection nor to guide or monitor treatment for MRSA infections.   Acid Fast Smear (AFB)     Status: None   Collection Time: 06/19/17  1:05 PM  Result Value Ref Range Status   AFB Specimen Processing Concentration  Final   Acid Fast Smear Negative  Final    Comment: (NOTE) Performed At: [facility] [place], [place] [id number] Lindon Romp MD VX:[id number]    Source (AFB) BRONCHIAL ALVEOLAR LAVAGE  Final  Acid Fast Smear (AFB)     Status: None   Collection Time: 06/19/17  1:05 PM  Result Value Ref Range Status   AFB Specimen Processing Concentration  Final   Acid Fast Smear Negative  Final    Comment: (NOTE) Performed At: [facility] [place], [place] [id number] Lindon Romp MD QG:[id number]    Source (AFB) BRONCHIAL ALVEOLAR LAVAGE  Final  Culture, fungus without smear     Status: Abnormal (Preliminary result)   Collection Time: 06/19/17  1:05 PM  Result Value Ref Range Status   Specimen Description BRONCHIAL ALVEOLAR LAVAGE  Final   Special Requests Immunocompromised  Final   Culture CANDIDA ALBICANS (A)  Final   Report Status PENDING  Incomplete  Pneumocystis smear by DFA     Status: None   Collection Time: 06/19/17  1:05 PM  Result Value Ref Range Status   Specimen Source-PJSRC BRONCHIAL ALVEOLAR LAVAGE  Final    Pneumocystis jiroveci Ag NEGATIVE  Final    Comment: Performed at [facility]  Culture, bal-quantitative     Status: Abnormal   Collection Time: 06/19/17  1:05 PM  Result Value Ref Range Status   Specimen Description BRONCHIAL ALVEOLAR LAVAGE  Final   Special Requests NONE  Final   Gram Stain   Final    ABUNDANT WBC PRESENT, PREDOMINANTLY PMN MODERATE GRAM VARIABLE ROD MODERATE GRAM POSITIVE COCCI IN PAIRS RARE BUDDING YEAST SEEN    Culture 20,000 COLONIES/mL PSEUDOMONAS AERUGINOSA (A)  Final   Report Status 06/21/2017 FINAL  Final   Organism ID, Bacteria PSEUDOMONAS AERUGINOSA (A)  Final      Susceptibility   Pseudomonas aeruginosa - MIC*    CEFTAZIDIME 4 SENSITIVE Sensitive  CIPROFLOXACIN <=0.25 SENSITIVE Sensitive     GENTAMICIN <=1 SENSITIVE Sensitive     IMIPENEM 1 SENSITIVE Sensitive     PIP/TAZO 8 SENSITIVE Sensitive     CEFEPIME 2 SENSITIVE Sensitive     * 20,000 COLONIES/mL PSEUDOMONAS AERUGINOSA  CSF culture with Stat gram stain     Status: None   Collection Time: 06/19/17  1:30 PM  Result Value Ref Range Status   Specimen Description CSF  Final   Special Requests NONE  Final   Gram Stain   Final    CYTOSPIN SMEAR WBC PRESENT, PREDOMINANTLY MONONUCLEAR YEAST CRITICAL RESULT CALLED TO, READ BACK BY AND VERIFIED WITH: Jessie Foot RN AT 0345 ON [id number] BY SJW    Culture FEW CRYPTOCOCCUS NEOFORMANS  Final   Report Status 06/22/2017 FINAL  Final  Fungus Culture With Stain     Status: None (Preliminary result)   Collection Time: 06/19/17  1:30 PM  Result Value Ref Range Status   Fungus Stain Final report  Final   Fungus (Mycology) Culture Preliminary report  Final    Comment: (NOTE) Performed At: [facility] [place], [place] [id number] Lindon Romp MD HC:[id number]    Fungal Source PENDING  Incomplete  Fungus Culture Result     Status: None   Collection Time: 06/19/17  1:30 PM  Result Value Ref Range Status   Result 1  Comment  Final    Comment: (NOTE) Fungal elements, such as arthroconidia, hyphal fragments, chlamydoconidia, observed. POSITIVE SMEAR REPORTED TO LESLEY B. AT 1115 ON 05/22/17 BY AM Performed At: [facility] [place], [place] [id number] Lindon Romp MD TG:[id number]   Fungal organism reflex     Status: None   Collection Time: 06/19/17  1:30 PM  Result Value Ref Range Status   Fungal result 1 Cryptococcus species  Corrected    Comment: (NOTE) Identification to follow. PRELIMINARY POSITIVE CULTURE REPORT CALLED TO MATTHEW V. 06-23-17 AT 1105, FAX TO [phone]   KMP PRELIMINARY IDENTIFICATION REPORT CALLED TO MATTHEW V. 06-23-17 AT 1422, FAX WAS NOT REQUESTED   KMP Performed At: [facility] [place], [place] [id number] Lindon Romp MD CV:[id number] CORRECTED ON 08/07 AT 1435: PREVIOUSLY REPORTED AS Comment   Acid Fast Smear (AFB)     Status: None   Collection Time: 06/21/17 12:12 PM  Result Value Ref Range Status   AFB Specimen Processing Concentration  Final   Acid Fast Smear Negative  Final    Comment: (NOTE) Performed At: [facility] [place], [place] [id number] Lindon Romp MD [id number]    Source (AFB) TRACHEAL ASPIRATE  Final  CSF culture     Status: Abnormal   Collection Time: 06/22/17  4:25 PM  Result Value Ref Range Status   Specimen Description CSF  Final   Special Requests NONE  Final   Gram Stain   Final    CYTOSPIN SMEAR WBC PRESENT,BOTH PMN AND MONONUCLEAR ENCAPSULATED YEAST SEEN CRITICAL RESULT CALLED TO, READ BACK BY AND VERIFIED WITH: Heath Lark RN 17:10 06/22/17 ([email or web address])    Culture (A)  Final    CRYPTOCOCCUS NEOFORMANS CRITICAL VALUE NOTED.  VALUE IS CONSISTENT WITH PREVIOUSLY REPORTED AND CALLED VALUE.    Report Status 06/26/2017 FINAL  Final  Fungus Culture With Stain     Status: None (Preliminary result)   Collection Time: 06/22/17  4:25 PM  Result Value Ref  Range Status   Fungus Stain Final report  Final    Comment: (NOTE) Performed At: [facility] [place], [place] [id number] Lindon Romp MD RA:[id number]    Fungus (Mycology) Culture PENDING  Incomplete   Fungal Source CSF  Final  Fungus Culture Result     Status: None   Collection Time: 06/22/17  4:25 PM  Result Value Ref Range Status   Result 1 Comment  Final    Comment: (NOTE) KOH/Calcofluor preparation:  no fungus observed. Performed At: [facility] [place], [place] [id number] Lindon Romp MD HT:[id number]   Culture, fungus without smear     Status: None (Preliminary result)   Collection Time: 06/24/17  5:35 PM  Result Value Ref Range Status   Specimen Description CSF  Final   Special Requests NONE  Final   Culture NO FUNGUS ISOLATED AFTER 3 DAYS  Final   Report Status PENDING  Incomplete    Studies/Results: Mr Jeri Cos Wo Contrast  Result Date: 06/25/2017 CLINICAL DATA:  Initial evaluation for acute altered mental status. History of HIV/AIDS, cryptococcal meningitis. EXAM: MRI HEAD WITHOUT AND WITH CONTRAST TECHNIQUE: Multiplanar, multiecho pulse sequences of the brain and surrounding structures were obtained without and with intravenous contrast. CONTRAST:  17m MULTIHANCE GADOBENATE DIMEGLUMINE 529 MG/ML IV SOLN COMPARISON:  Prior CT from 06/24/2017. FINDINGS: Brain: Diffuse prominence of the CSF containing spaces compatible with generalized cerebral atrophy, advanced for age. Patchy T2/FLAIR hyperintensity involving the periventricular and deep white matter both cerebral hemispheres, most like related chronic microvascular ischemic disease, mild to moderate nature. Single tiny 4 mm focus of diffusion abnormality overlies the parasagittal left parieto-occipital region (series 3, image 30). Additional faint diffusion abnormality at the periventricular right basal ganglia (series 3, image 27). Few patchy areas of left meningeal  FLAIR signal abnormality, most notable at the anterior left frontal lobe (series 8, image 15). (Scattered areas of patchy leptomeningeal enhancement, most evident overlying the left temporal lobe (series 13, image 14). Findings most likely related to known history of meningitis. No other frank signal changes to suggest associated encephalitis. No intraventricular debris or hydrocephalus. No other evidence for acute infarct. No evidence for acute intracranial hemorrhage. subcentimeter chronic microhemorrhage noted at the left frontal operculum (series 9, image 63). No mass lesion or midline shift. No extra-axial fluid collection. No other abnormal enhancement. Major dural sinuses are grossly patent. Pituitary gland suprasellar region within normal limits. Vascular: Major intracranial vascular flow voids are maintained. Skull and upper cervical spine: Craniocervical junction within normal limits. Multilevel degenerative spondylolysis noted within the visualized upper cervical spine without significant stenosis. Bone marrow signal somewhat diffusely decreased on T1 weighted sequence, suspected to be related to underlying chronic disease. No discrete osseous lesions. No scalp soft tissue abnormality. Sinuses/Orbits: Globes and orbital soft tissues within normal limits. Moderate mucosal thickening within the left ethmoidal air cells and maxillary sinus. Fluid layers within the posterior nasopharynx. Nasogastric tube in place. Trace opacity left mastoid air cells, of doubtful significance. Inner ear structures normal. IMPRESSION: 1. Patchy areas of leptomeningeal signal abnormality and enhancement as above, consistent with known history of acute cryptococcal meningitis. No frank encephalitis or other complication identified. 2. Advanced cerebral atrophy for patient age with moderate chronic small vessel ischemic disease. 3. Moderate left-sided paranasal sinus disease. Electronically Signed   By: BJeannine BogaM.D.    On: 06/25/2017 22:17   Dg Chest Port 1 View  Result Date: 06/26/2017 CLINICAL DATA:  Acute onset of respiratory failure. Initial encounter. EXAM: PORTABLE CHEST  1 VIEW COMPARISON:  Chest radiograph performed 06/19/2017 FINDINGS: The patient's endotracheal tube is seen ending 2 cm above the carina. An enteric tube is noted extending below the diaphragm. The lungs are clear bilaterally. No focal consolidation, pleural effusion or pneumothorax is seen. The cardiomediastinal silhouette is normal in size. No acute osseous abnormalities are identified. IMPRESSION: 1. Endotracheal tube seen ending 2 cm above the carina. 2. Lungs clear bilaterally. Electronically Signed   By: Garald Balding M.D.   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  #1 Cryptococcal meningitis: MRI shows patchy areas of leptomeningeal enhancement consistent with his known meningitis from cryptococcus. Specific cause of his cranial nerve palsy not seen but inferred due to infection  He is approaching 2 weeks of amphotericin, flucytosine was abondoned due toxicity  After 14 days ampho change him to high-dose fluconazole  I think he should have a repeat LP on Monday with documented opening  pressure and removal of roughly 30 mL of CSF, documentation of closing pressure.  #2 HIV/AIDS: will start ARV  5 weeks into his crypto treatment. I have more confidence that NOW he is indeedmotivated  #3 Bacterial PNA: finish antibacterial abx  #4 FTT: I think he is clinically going to need to be placed into facility for rehabilitation while recuperating from the severe CNS infection.          LOS: 10 days   Alcide Evener 06/27/2017, 3:53 PM

## 2017-06-27 NOTE — Progress Notes (Signed)
PULMONARY / CRITICAL CARE MEDICINE   Name: Kenneth Rivas MRN: [id number] DOB: 17-Oct-1961    ADMISSION DATE:  06/16/2017   CONSULTATION DATE:  06/19/2017  REFERRING MD:  Drucilla Schmidt   CHIEF COMPLAINT:   Respiratory distress and altered mental status   HISTORY OF PRESENT ILLNESS:  56 y.o. 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 Admitted on 7/31 w/ cc: weakness and FTT. Admitting dx: FTT and cachexia d/t AIDs, pulmonary nodules, bedbugs, and HTn. Had CT chest showing cavitary lung lesion in the RML. ID was consulted and ordered serum crypto, this was positive. The patient was complaining of headache. An attempt to have LP on 8/2 was unsuccessful due to what was felt to be positional hypoxia during prone positioning. PCCM was asked to see him on 8/3 for formal consult w/ request to obtain LP as well as bronchoscopy/ BAL for AFB. As active TB and or cryptococcal infection would be contra-indication to resuming ARVs acutely   Subjective:  Still no acute events overnight. Patient tolerating PS wean & SBT today. Tube feedings off.   Review of Systems:  Unable to obtain with intubation.   VITAL SIGNS: BP 135/81 (BP Location: Right Arm)   Pulse (!) 1   Temp 98.1 F (36.7 C) (Oral)   Resp 20   Ht _0  (1.676 m)   Wt 111 lb 8.8 oz (50.6 kg)   SpO2 98%   BMI 18.01 kg/m   HEMODYNAMICS:    VENTILATOR SETTINGS: Vent Mode: CPAP;PSV FiO2 (%):  [30 %] 30 % Set Rate:  [12 bmp] 12 bmp Vt Set:  [500 mL] 500 mL PEEP:  [5 cmH20] 5 cmH20 Pressure Support:  [5 cmH20] 5 cmH20 Plateau Pressure:  [17 cmH20-19 cmH20] 19 cmH20  INTAKE / OUTPUT: I/O last 3 completed shifts: In: 9983 [I.V.:2700; NG/GT:1800; IV Piggyback:2350] Out: 4625 [Urine:4625]   General:  Patient watching TV. No distress. Integument:  Warm. Dry. No rash. HEENT:  Endotracheal tube remains in place. Moist pedis membranes. Continues to have  left eyelid ptosis. Cardiovascular:  Regular rate. Regular rhythm. No JVD or edema appreciated. Pulmonary:  Normal work of breathing on pressure support 0/5. Good aeration bilaterally.  Abdomen:  Normal bowel sounds. Soft.  Neurologic:  Moving all extremities equally. Following commands.  LABS:  BMET  Recent Labs Lab 06/25/17 0341 06/26/17 0208 06/27/17 0222  NA 133* 130* 132*  K 3.3* 3.9 3.7  CL 102 102 102  CO2 _1 BUN <5* 6 7  CREATININE 0.54* 0.53* 0.53*  GLUCOSE 99 103* 103*    Electrolytes  Recent Labs Lab 06/25/17 0341 06/26/17 0208 06/27/17 0222  CALCIUM 8.2* 8.2* 8.2*  MG 1.6* 1.5* 1.6*  PHOS 3.0 2.9 2.9    CBC  Recent Labs Lab 06/25/17 0341 06/26/17 0208 06/27/17 0222  WBC 1.5* 1.5* 2.4*  HGB 9.1* 8.4* 8.4*  HCT 27.0* 25.1* 25.7*  PLT 169 174 184    Coag's No results for input(s): APTT, INR in the last 168 hours.  Sepsis Markers No results for input(s): LATICACIDVEN, PROCALCITON, O2SATVEN in the last 168 hours.  ABG  Recent Labs Lab 06/26/17 0325  PHART 7.468*  PCO2ART 33.9  PO2ART 109*    Liver Enzymes No results for input(s): AST, ALT, ALKPHOS, BILITOT, ALBUMIN in the last 168 hours.  Cardiac Enzymes No results for input(s): TROPONINI, PROBNP in the last 168 hours.  Glucose  Recent Labs Lab  06/24/17 1130 06/24/17 1544 06/24/17 2005 06/24/17 2330 06/25/17 0340 06/25/17 0812  GLUCAP 89 117* 84 88 86 105*    Imaging No results found.   IMAGING/STUDIES: UDS 7/26:  Cocaine Positive UDS 7/31:  Cocaine Positive  CT CHEST W/ 8/1:  7 mm cavitary lesion is seen in the right middle lobe. It is uncertain if this represents cavitary metastatic lesion or small pulmonary abscess. Clinical correlation is recommended. Follow-up CT scan in 6-8 weeks is recommended to determine stability or resolution. CT HEAD W/O 8/2:  Progression of generalized atrophy which may be due to HIV infection. Interval development of diffuse white  matter disease in the periventricular white matter with focal hypodensity left inferior frontal lobe. This could be due to chronic ischemia however in the setting of HIV also consider infection and less likely tumor. Follow-up MRI of the brain with contrast suggested. PORT CXR 8/3:  Previously reviewed by me. Endotracheal tube in good position. Enteric feeding tube going below diaphragm. No focal opacity.  CT HEAD W/O 8/8: IMPRESSION: 1. No acute intracranial abnormalities. 2. Volume loss greater than expected for patient's age. Mild chronic microvascular ischemic change. MRI BRAIN W/ & W/O 8/9: IMPRESSION: 1. Patchy areas of leptomeningeal signal abnormality and enhancement as above, consistent with known history of acute cryptococcal meningitis. No frank encephalitis or other complication identified. 2. Advanced cerebral atrophy for patient age with moderate chronic small vessel ischemic disease. 3. Moderate left-sided paranasal sinus disease. PORT CXR 8/10:  Previously reviewed by me. No focal opacity. Endotracheal tube in good position. An enteric feeding tube coursing below diaphragm. No pleural effusion.  MICROBIOLOGY: Quantiferon- TB 8/1:  Indeterminate Sputum AFB Culture 8/1 >>> (smear negative) SPutum AFB Culture 8/2 >>> (smear negative) Sputum Culture 8/2:  Negative Cryptococcal Ag Titer 8/2:  >1:2560  MRSA PCR 8/3:  Negative  Blood Cultures x2 8/2 >>> 2/2 Positive Budding Yeast BAL 8/3 >>>  Pseudomonas aeruginosa / C albican / AFB pending (smear negative) Cryptococcal Ag Titer 8/3:  >1:2560 CSF Culture 8/3: Cyrptococcus neoformans ABG Culture 8/5 >>> pending (smear negative) CSF Culture 8/6 >>> Yeast CSF Cryto Ag Titer 8/8:  2560 CSF Culture 8/8 >>>   ANTIBIOTICS: Vancomycin 8/3 - 8/5 Zosyn 8/3 - 8/5 Flucytosine 8/2 - 8/8 Azithromycin weekly  Bactrim DS daily Amphotericin B 8/2 >>> [name] 8/5 >>> (end date 8/12)  SIGNIFICANT EVENTS: 07/31 - Admit 08/03 -  Bronchoscopy by DF w/ purulent secretions. LP w/ opening pressure 47cm H2O & closing pressure 17cm H2O after removing 10cc CSF 08/04 - LP opening pressure 38cm H2O & closing pressure 7cm after 9cm  08/04 - LP opening pressure 14cm H2O & closing pressure 14cm H2O 08/06 - LP opening pressure 15cm H2O & closing pressure 6cm H2O - 28 mL removed 08/08 - Tolerated 1 hour of SBT/PS wean. Bedside LP by Dr. Lake Bells w/ change in eye exam >> opening pressure 9.5cm H2O & closing pressure 7cm H2O - 20 cc of clear CSF removed 08/09 - Still only tolerated 1 hour of SBT/PS wean  LINES/TUBES: Foley (out 8/10) OETT 8/3 >>> R NGT >>> PIV  ASSESSMENT / PLAN:  PULMONARY A: Acute hypoxic respiratory failure: Secondary to inability to protect airway. Improving. Right middle lobe cavitary nodule: Likely secondary to cryptococcal infection. Pseudomonas pneumonia  P:   Completing spontaneous breathing trial Hopeful for extubation today if cuff leak is present  CARDIOVASCULAR A:  Essential hypertension: Blood pressure controlled.   P:  Continue telemetry monitoring Monitoring vitals per unit  protocol Continue Norvasc Hydralazine IV when necessary   RENAL A:   Hyponatremia: Oscillating.  Hypomagnesemia: Replaced.  Hypokalemia: Marginal.  P:   Magnesium Sulfate today Trending electrolytes daily Monitoring UOP   GASTROINTESTINAL A:   Severe protein calorie malnutrition: Patient also has failure to thrive.  Constipation:  Resolved.  P:   Continuing Senna via tube qhs Continuing tube feedings Continuing Protonix via tube daily  HEMATOLOGIC A:   Anemia: Hgb stable. No bleeding.  Leukopenia:  Stable. Likely due to AIDS. Thrombocytopenia: Resolved. Likely secondary to sepsis.   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 Pseudomonas  pneumonia  Cavitary right middle lobe nodule: Likely secondary to cryptococcal infection. AIDS Hepatitis B viral infection   P:   Continuing antibiotics as above ID following and guiding antibiotics Awaiting finalization of cultures and infectious workup   NEUROLOGIC A:   Cranial Nerve Palsy Cryptococcal meningitis Increased CSF/intracranial pressure: Improving. Undergoing serial lumbar punctures. IR & neurosurgery refused to place lumbar drain. Goal opening pressure <18 cm H2O.   P:   Desired RASS:  0 to -1 Intermittent Lumbar Puncture Intermittent sedation   Endocrine A: No acute issues.  P: Monitoring glucose with labs daily.   FAMILY  - Updates:  Sisters updated 8/6 at bedside by Dr. Ashok Cordia.  No family at bedside 8/11.  DISCUSSION:  56 y.o.  male with disseminated cryptococcal infection. Tolerating spontaneous breathing trial. Plan for extubation today.  I have spent a total of 34 minutes of critical care time today caring for the patient and reviewing the patient's electronic medical record.   Sonia Baller Ashok Cordia, M.D. [facility] Pulmonary & Critical Care Pager:  [phone] After 3pm or if no response, call [phone] 06/27/2017  1:22 PM

## 2017-06-27 NOTE — Progress Notes (Signed)
[facility] LAB REPLACEMENT ONLY  The patient does apply for the [facility]LINK Adult ICU Electrolyte Replacment Protocol based on the criteria listed below:   1. Is GFR >/= 40 ml/min? Yes.    Patient's GFR today is>60 2. Is urine output >/= 0.5 ml/kg/hr for the last 6 hours? Yes.   Patient's UOP is 2.9 ml/kg/hr 3. Is BUN < 60 mg/dL? Yes.    Patient's BUN today is 7 4. Abnormal electrolyte(s): 3.7 5. Ordered repletion with: per protocol 6. If a panic level lab has been reported, has the CCM MD in charge been notified? Yes.  .   Physician:  Dr. Juline PatchYacoub  Kenneth Rivas, Kenneth BoosMaria Samson 06/27/2017 5:31 AM

## 2017-06-27 NOTE — Procedures (Signed)
Extubation Procedure Note  Patient Details:   Name: Jacolyn Reedylonzo Lupe DOB: 1961-01-13 MRN: [id number]   Airway Documentation:  Airway 8 mm (Active)  Secured at (cm) 24 cm 06/27/2017 12:00 PM  Measured From Lips 06/27/2017 12:00 PM  Secured Location Center 06/27/2017 11:52 AM  Secured By [employer] Tube Holder 06/27/2017 12:00 PM  Tube Holder Repositioned Yes 06/27/2017 11:52 AM  Cuff Pressure (cm H2O) 26 cm H2O 06/27/2017  7:55 AM  Site Condition Dry 06/27/2017 12:00 PM    Evaluation  O2 sats: stable throughout and currently acceptable Complications: No apparent complications Patient did tolerate procedure well. Bilateral Breath Sounds: Clear, Diminished   Yes  Antoine Pocherogdon, Quavion Boule Caroline 06/27/2017, 2:05 PM

## 2017-06-28 ENCOUNTER — Inpatient Hospital Stay (HOSPITAL_COMMUNITY): Payer: Medicaid Other

## 2017-06-28 LAB — CBC WITH DIFFERENTIAL/PLATELET
BASOS PCT: 3 %
Basophils Absolute: 0.1 10*3/uL (ref 0.0–0.1)
EOS ABS: 0.2 10*3/uL (ref 0.0–0.7)
Eosinophils Relative: 5 %
HCT: 26.8 % — ABNORMAL LOW (ref 39.0–52.0)
HEMOGLOBIN: 8.9 g/dL — AB (ref 13.0–17.0)
LYMPHS PCT: 36 %
Lymphs Abs: 1.1 10*3/uL (ref 0.7–4.0)
MCH: 26.6 pg (ref 26.0–34.0)
MCHC: 33.2 g/dL (ref 30.0–36.0)
MCV: 80.2 fL (ref 78.0–100.0)
Monocytes Absolute: 0.3 10*3/uL (ref 0.1–1.0)
Monocytes Relative: 10 %
NEUTROS ABS: 1.4 10*3/uL — AB (ref 1.7–7.7)
Neutrophils Relative %: 46 %
PLATELETS: 200 10*3/uL (ref 150–400)
RBC: 3.34 MIL/uL — ABNORMAL LOW (ref 4.22–5.81)
RDW: 14.4 % (ref 11.5–15.5)
WBC: 3.1 10*3/uL — ABNORMAL LOW (ref 4.0–10.5)

## 2017-06-28 LAB — BASIC METABOLIC PANEL
Anion gap: 7 (ref 5–15)
BUN: 6 mg/dL (ref 6–20)
CHLORIDE: 101 mmol/L (ref 101–111)
CO2: 23 mmol/L (ref 22–32)
Calcium: 8.2 mg/dL — ABNORMAL LOW (ref 8.9–10.3)
Creatinine, Ser: 0.48 mg/dL — ABNORMAL LOW (ref 0.61–1.24)
GFR calc Af Amer: >60 mL/min (ref >60)
GFR calc non Af Amer: >60 mL/min (ref >60)
GLUCOSE: 99 mg/dL (ref 65–99)
Potassium: 3.8 mmol/L (ref 3.5–5.1)
Sodium: 131 mmol/L — ABNORMAL LOW (ref 135–145)

## 2017-06-28 LAB — PHOSPHORUS: PHOSPHORUS: 3.6 mg/dL (ref 2.5–4.6)

## 2017-06-28 LAB — MAGNESIUM: Magnesium: 1.5 mg/dL — ABNORMAL LOW (ref 1.7–2.4)

## 2017-06-28 MED ORDER — MAGNESIUM SULFATE 2 GM/50ML IV SOLN
2.0000 g | Freq: Once | INTRAVENOUS | Status: AC
  Administered 2017-06-28: 2 g via INTRAVENOUS
  Filled 2017-06-28: qty 50

## 2017-06-28 NOTE — Progress Notes (Signed)
Modified Barium Swallow Progress Note  Patient Details  Name: Kenneth Rivas MRN: [id number] Date of Birth: 09-17-1961  Today's Date: 06/28/2017  Modified Barium Swallow completed.  Full report located under Chart Review in the Imaging Section.  Brief recommendations include the following:  Clinical Impression  Patient presents with an acute, reversible pharyngeal dysphagia s/p 9 day intubation, and suspected esophageal dysphagia. Pt's initial swallows of puree, honey and nectar thick liquids appeared WFL, but as study progressed, suspect pt impacted by fatigue as reduced base of tongue retraction, reduced pharyngeal constriction and decreased hyolaryngeal excursion contributed to mod-severe residue across consistencies which was penetrated and ultimately silently aspirated after the swallow. Pharyngoesophageal segment remarkable for reduced amplitude/duration of opening, impeding bolus flow. Intermittent backflow through the UES into the pharynx noted. One instance of suspected backflow into larynx between frames, as subsequent frame showed pooling of contrast just above UES, in laryngeal vestibule and on vocal cords despite complete oral/pharyngeal clearance in prior frame. Pt's cough marginally effective for clearing airway, however with fatigue he was unable to sufficiently clear pharynx with cued dry swallows, resulting in subsequent penetration, likely trace aspiration of diffused penetrate. Recommend pt remain NPO with NG tube, with occasional ice chips after oral care to facilitate use of swallowing musculature. SLP will follow; pt may benefit from RMST, repeat instrumental assessment to determine readiness for diet initiation. He may benefit from an esophageal assessment given ?esophageal dysphagia.    Swallow Evaluation Recommendations       SLP Diet Recommendations: NPO;Ice chips PRN after oral care;Alternative means - temporary       Medication Administration: Via alternative  means               Oral Care Recommendations: Oral care QID;Other (Comment) (prior to ice chip)      Kenneth BatonMary Beth Marlyne Totaro, MS, CCC-SLP Speech-Language Pathologist [phone]  Kenneth Rivas 06/28/2017,1:08 PM

## 2017-06-28 NOTE — Progress Notes (Signed)
eLink Physician-Brief Progress Note Patient Name: Kenneth Rivas DOB: May 30, 1961 MRN: [id number]   Date of Service  06/28/2017  HPI/Events of Note  Mg 1.5  eICU Interventions  2 gm of Mg given     Intervention Category Major Interventions: Electrolyte abnormality - evaluation and management  YACOUB,WESAM 06/28/2017, 4:43 AM

## 2017-06-28 NOTE — Progress Notes (Addendum)
PULMONARY / CRITICAL CARE MEDICINE   Name: Kenneth Rivas MRN: [id number] DOB: [birth year]    ADMISSION DATE:  06/16/2017   CONSULTATION DATE:  06/19/2017  REFERRING MD:  Drucilla Schmidt   CHIEF COMPLAINT:   Respiratory distress and altered mental status   HISTORY OF PRESENT ILLNESS:  56 y.o. 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 Admitted on 7/31 w/ cc: weakness and FTT. Admitting dx: FTT and cachexia d/t AIDs, pulmonary nodules, bedbugs, and HTn. Had CT chest showing cavitary lung lesion in the RML. ID was consulted and ordered serum crypto, this was positive. The patient was complaining of headache. An attempt to have LP on 8/2 was unsuccessful due to what was felt to be positional hypoxia during prone positioning. PCCM was asked to see him on 8/3 for formal consult w/ request to obtain LP as well as bronchoscopy/ BAL for AFB. As active TB and or cryptococcal infection would be contra-indication to resuming ARVs acutely   Subjective:  Patient extubated yesterday. Still somewhat confused. Denies any chest pain or pressure. Denies any headache. Denies any difficulty breathing. Failed swallow evaluation today.  Review of Systems:  Unable to obtain with altered mental status.  VITAL SIGNS: BP (!) 155/97   Pulse 84   Temp 97.7 F (36.5 C)   Resp 16   Ht _0  (1.676 m)   Wt 111 lb 15.9 oz (50.8 kg)   SpO2 99%   BMI 18.08 kg/m   HEMODYNAMICS:    VENTILATOR SETTINGS: Vent Mode: CPAP;PSV FiO2 (%):  [30 %] 30 % PEEP:  [5 cmH20] 5 cmH20 Pressure Support:  [5 cmH20] 5 cmH20  INTAKE / OUTPUT: I/O last 3 completed shifts: In: 2297 [I.V.:2700; NG/GT:1800; IV Piggyback:2850] Out: 9892 [Urine:4950]   General:  Awake. Watching TV. No family at bedside. Integument:  No rash on exposed skin. Warm. Dry. HEENT:  Moist mucous membranes. No scleral icterus or injection. Cardiovascular:  Regular rate. Regular  rhythm. No edema. Pulmonary:  Normal work of breathing on room air. Clear to auscultation bilaterally. Abdomen:  Soft. Nontender. Normal bowel sounds. Neurologic:  Left eyelid ptosis continues. Otherwise grossly nonfocal. Oriented to place and person but not year or president.  LABS:  BMET  Recent Labs Lab 06/26/17 0208 06/27/17 0222 06/28/17 0119  NA 130* 132* 131*  K 3.9 3.7 3.8  CL 102 102 101  CO2 _1 BUN _2 CREATININE 0.53* 0.53* 0.48*  GLUCOSE 103* 103* 99    Electrolytes  Recent Labs Lab 06/26/17 0208 06/27/17 0222 06/28/17 0119  CALCIUM 8.2* 8.2* 8.2*  MG 1.5* 1.6* 1.5*  PHOS 2.9 2.9 3.6    CBC  Recent Labs Lab 06/26/17 0208 06/27/17 0222 06/28/17 0119  WBC 1.5* 2.4* 3.1*  HGB 8.4* 8.4* 8.9*  HCT 25.1* 25.7* 26.8*  PLT 174 184 200    Coag's No results for input(s): APTT, INR in the last 168 hours.  Sepsis Markers No results for input(s): LATICACIDVEN, PROCALCITON, O2SATVEN in the last 168 hours.  ABG  Recent Labs Lab 06/26/17 0325  PHART 7.468*  PCO2ART 33.9  PO2ART 109*    Liver Enzymes  Recent Labs Lab 06/27/17 1330  AST 53*  ALT 47  ALKPHOS 77  BILITOT 0.2*  ALBUMIN 2.2*    Cardiac Enzymes No results for input(s): TROPONINI, PROBNP in the last 168 hours.  Glucose  Recent Labs Lab 06/24/17 1130 06/24/17 1544  06/24/17 2005 06/24/17 2330 06/25/17 0340 06/25/17 0812  GLUCAP 89 117* 84 88 86 105*    Imaging No results found.   IMAGING/STUDIES: UDS 7/26:  Cocaine Positive UDS 7/31:  Cocaine Positive  CT CHEST W/ 8/1:  7 mm cavitary lesion is seen in the right middle lobe. It is uncertain if this represents cavitary metastatic lesion or small pulmonary abscess. Clinical correlation is recommended. Follow-up CT scan in 6-8 weeks is recommended to determine stability or resolution. CT HEAD W/O 8/2:  Progression of generalized atrophy which may be due to HIV infection. Interval development of diffuse white  matter disease in the periventricular white matter with focal hypodensity left inferior frontal lobe. This could be due to chronic ischemia however in the setting of HIV also consider infection and less likely tumor. Follow-up MRI of the brain with contrast suggested. PORT CXR 8/3:  Previously reviewed by me. Endotracheal tube in good position. Enteric feeding tube going below diaphragm. No focal opacity.  CT HEAD W/O 8/8: IMPRESSION: 1. No acute intracranial abnormalities. 2. Volume loss greater than expected for patient's age. Mild chronic microvascular ischemic change. MRI BRAIN W/ & W/O 8/9: IMPRESSION: 1. Patchy areas of leptomeningeal signal abnormality and enhancement as above, consistent with known history of acute cryptococcal meningitis. No frank encephalitis or other complication identified. 2. Advanced cerebral atrophy for patient age with moderate chronic small vessel ischemic disease. 3. Moderate left-sided paranasal sinus disease. PORT CXR 8/10:  Previously reviewed by me. No focal opacity. Endotracheal tube in good position. An enteric feeding tube coursing below diaphragm. No pleural effusion.  MICROBIOLOGY: Quantiferon- TB 8/1:  Indeterminate Sputum AFB Culture 8/1 >>> (smear negative) SPutum AFB Culture 8/2 >>> (smear negative) Sputum Culture 8/2:  Negative Cryptococcal Ag Titer 8/2:  >1:2560  MRSA PCR 8/3:  Negative  Blood Cultures x2 8/2 >>> 2/2 Positive Budding Yeast BAL 8/3 >>>  Pseudomonas aeruginosa / C albican / AFB pending (smear negative) Cryptococcal Ag Titer 8/3:  >1:2560 CSF Culture 8/3: Cyrptococcus neoformans ABG Culture 8/5 >>> pending (smear negative) CSF Culture 8/6 >>> Yeast CSF Cryto Ag Titer 8/8:  2560 CSF Culture 8/8 >>>   ANTIBIOTICS: Vancomycin 8/3 - 8/5 Zosyn 8/3 - 8/5 Flucytosine 8/2 - 8/8 Azithromycin weekly  Bactrim DS daily Amphotericin B 8/2 >>> [name] 8/5 >>> (end date 8/12)  SIGNIFICANT EVENTS: 07/31 - Admit 08/03 -  Bronchoscopy by DF w/ purulent secretions. LP w/ opening pressure 47cm H2O & closing pressure 17cm H2O after removing 10cc CSF 08/04 - LP opening pressure 38cm H2O & closing pressure 7cm after 9cm  08/04 - LP opening pressure 14cm H2O & closing pressure 14cm H2O 08/06 - LP opening pressure 15cm H2O & closing pressure 6cm H2O - 28 mL removed 08/08 - Tolerated 1 hour of SBT/PS wean. Bedside LP by Dr. Lake Bells w/ change in eye exam >> opening pressure 9.5cm H2O & closing pressure 7cm H2O - 20 cc of clear CSF removed 08/09 - Still only tolerated 1 hour of SBT/PS wean  LINES/TUBES: Foley (out 8/10) OETT 8/3 - 8/11 R NGT >>> PIV  ASSESSMENT / PLAN:  PULMONARY A: Acute hypoxic respiratory failure: Resolved. Right middle lobe cavitary nodule: Likely secondary to cryptococcal infection. Pseudomonas pneumonia  P:   Continuous pulse oximetry Incentive spirometry while awake  CARDIOVASCULAR A:  Essential hypertension: Blood pressure controlled.   P:  Monitoring vitals per unit protocol Continuing Norvasc Continuous telemetry monitoring Hydralazine IV when necessary   RENAL A:   Hyponatremia: Oscillating  but stable. Hypomagnesemia: Replaced.  Hypokalemia: Marginal.  P:   Magnesium sulfate 2 g IV Monitoring electrolytes and renal function daily Replace electrolytes per protocol while on amphotericin B  GASTROINTESTINAL A:   Severe protein calorie malnutrition: Patient also has failure to thrive.  Constipation:  Resolved.  P:   Placing a consult for CorTrak feeding tube placement to allow removal of NGT Continuing Senna via tube qhs NPO until cleared by Speech  HEMATOLOGIC A:   Anemia: Hgb stable. No bleeding.  Leukopenia:  Improving. Likely due to AIDS and sepsis. Thrombocytopenia: Resolved. Likely secondary to sepsis.   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 Pseudomonas pneumonia  Cavitary right middle lobe nodule: Likely secondary to cryptococcal infection. AIDS Hepatitis B viral infection   P:   Continuing antibiotics as per ID recommendations Awaiting finalization of cultures   NEUROLOGIC A:   Cranial Nerve Palsy:  MRI without acute CVA. Cryptococcal meningitis Increased CSF/intracranial pressure: IR & neurosurgery previously refused to place lumbar drain. Goal opening pressure <18 cm H2O.   P:   Likely will need LP on Monday or sooner if mental status changes. See ID Section   Endocrine A: No acute issues.  P: Monitoring glucose with labs daily.   Prophylaxis:  SCDs, Protonix via Tube Daily, & Heparin [place] q8hr.  Diet:  NPO. Speech Following. Continuing Tube Feedings.  Code Status:  Full Code per previous physician discussions. Disposition:  Transferring patient to Telemetry Bed. Speech Therapy Following. Consulting PT/OT.  Family Update:  Patient updated during rounds today. Sisters updated 8/6 by Dr. Ashok Cordia.   DISCUSSION:  56 y.o. male with a history of HIV and AIDS. Currently has disseminated cryptococcal infection. Clinically improving. Respiratory status is progressively improving. Continue to have cranial nerve palsy with MRI negative for acute lesion. Suspect this is secondary to his cryptococcal infection and hopefully we will progressively recovered. Likely will need repeat lumbar puncture tomorrow depending upon ID recommendations.   I have spent a total of 37 minutes of time today caring for the patient, reviewing the patient's electronic medical record, and with more than 50% of that time spent coordinating transfer of care with the patient as well as reviewing the continuing plan of care with the patient at bedside.  FMTS to assume care & PCCM off as of 8/13.  Sonia Baller Ashok Cordia, M.D. [facility] Pulmonary & Critical Care Pager:  [phone] After 3pm or if no response, call  [phone] 06/28/2017  11:33 AM

## 2017-06-28 NOTE — Progress Notes (Signed)
Subjective: No new complaints    Antibiotics:  Anti-infectives    Start     Dose/Rate Route Frequency Ordered Stop   06/23/17 1600  Oritavancin Diphosphate (ORBACTIV) 1,200 mg in dextrose 5 % IVPB  Status:  Discontinued     1,200 mg 333.3 mL/hr over 180 Minutes Intravenous Once 06/23/17 1556 06/23/17 1557   06/22/17 2000  amphotericin B liposome (AMBISOME) 200 mg in dextrose 5 % 500 mL IVPB     4 mg/kg  49.5 kg 250 mL/hr over 120 Minutes Intravenous Every 24 hours 06/22/17 1020     06/22/17 1200  flucytosine (ANCOBON) capsule 1,250 mg  Status:  Discontinued     25 mg/kg  49.5 kg Per Tube Every 6 hours 06/22/17 1020 06/22/17 1024   06/22/17 1200  flucytosine (ANCOBON) capsule 1,250 mg  Status:  Discontinued     1,250 mg Per Tube Every 6 hours 06/22/17 1024 06/24/17 0912   06/21/17 1400  cefTAZidime (FORTAZ) 1 g in dextrose 5 % 50 mL IVPB     1 g 100 mL/hr over 30 Minutes Intravenous Every 8 hours 06/21/17 1230 06/28/17 1452   06/21/17 0000  flucytosine (ANCOBON) capsule 1,000 mg  Status:  Discontinued     25 mg/kg  44.2 kg Per Tube Every 6 hours 06/20/17 2349 06/22/17 1020   06/20/17 1000  sulfamethoxazole-trimethoprim (BACTRIM DS,SEPTRA DS) 800-160 MG per tablet 1 tablet     1 tablet Per Tube Daily 06/19/17 1439     06/20/17 1000  azithromycin (ZITHROMAX) tablet 1,200 mg     1,200 mg Per Tube Weekly 06/19/17 1439     06/20/17 0500  vancomycin (VANCOCIN) 500 mg in sodium chloride 0.9 % 100 mL IVPB  Status:  Discontinued     500 mg 100 mL/hr over 60 Minutes Intravenous Every 12 hours 06/19/17 1340 06/21/17 1157   06/19/17 2000  piperacillin-tazobactam (ZOSYN) IVPB 3.375 g  Status:  Discontinued     3.375 g 12.5 mL/hr over 240 Minutes Intravenous Every 8 hours 06/19/17 1340 06/21/17 1157   06/19/17 2000  amphotericin B liposome (AMBISOME) 180 mg in dextrose 5 % 500 mL IVPB  Status:  Discontinued     4 mg/kg  44.2 kg 250 mL/hr over 120 Minutes Intravenous Every 24  hours 06/19/17 1509 06/22/17 1020   06/19/17 1400  vancomycin (VANCOCIN) IVPB 1000 mg/200 mL premix     1,000 mg 200 mL/hr over 60 Minutes Intravenous  Once 06/19/17 1340 06/19/17 1627   06/19/17 1400  piperacillin-tazobactam (ZOSYN) IVPB 3.375 g     3.375 g 100 mL/hr over 30 Minutes Intravenous  Once 06/19/17 1340 06/19/17 1556   06/18/17 2000  amphotericin B liposome (AMBISOME) 180 mg in dextrose 5 % 500 mL IVPB  Status:  Discontinued     4 mg/kg  44.2 kg 250 mL/hr over 120 Minutes Intravenous Every 24 hours 06/18/17 1834 06/19/17 1509   06/18/17 1915  amphotericin B liposome (AMBISOME) 180 mg in dextrose 5 % 500 mL IVPB  Status:  Discontinued     4 mg/kg  44.2 kg 250 mL/hr over 120 Minutes Intravenous Every 24 hours 06/18/17 1813 06/18/17 1834   06/18/17 1815  flucytosine (ANCOBON) capsule 1,000 mg  Status:  Discontinued     25 mg/kg  44.2 kg Oral Every 6 hours 06/18/17 1813 06/20/17 2349      Medications: Scheduled Meds: . amLODipine  5 mg Per Tube Daily  . azithromycin  1,200  mg Per Tube Weekly  . chlorhexidine  15 mL Mouth Rinse BID  . dextrose  10 mL Intravenous Q24H  . dextrose  10 mL Intravenous Q24H  . heparin subcutaneous  5,000 Units Subcutaneous Q8H  . mouth rinse  15 mL Mouth Rinse q12n4p  . pantoprazole sodium  40 mg Per Tube Daily  . sennosides  5 mL Per Tube QHS  . sulfamethoxazole-trimethoprim  1 tablet Per Tube Daily   Continuous Infusions: . sodium chloride 75 mL/hr at 06/27/17 1913  . amphotericin  B  Liposome (AMBISOME) ADULT IV Stopped (06/27/17 2225)  . feeding supplement (VITAL AF 1.2 CAL) 1,000 mL (06/28/17 1552)  . sodium chloride Stopped (06/27/17 1930)  . sodium chloride 0 mL (06/21/17 2321)   PRN Meds:.diphenhydrAMINE, fentaNYL (SUBLIMAZE) injection, hydrALAZINE, midazolam    Objective: Weight change: 7.1 oz (0.2 kg)  Intake/Output Summary (Last 24 hours) at 06/28/17 1752 Last data filed at 06/28/17 1422  Gross per 24 hour  Intake              4325 ml  Output             4325 ml  Net                0 ml   Blood pressure (!) 141/77, pulse 73, temperature 98.4 F (36.9 C), temperature source Oral, resp. rate 20, height _0  (1.676 m), weight 111 lb 15.9 oz (50.8 kg), SpO2 99 %. Temp:  [97.7 F (36.5 C)-98.6 F (37 C)] 98.4 F (36.9 C) (08/12 1558) Pulse Rate:  [63-84] 73 (08/12 1558) Resp:  [12-28] 20 (08/12 1558) BP: (131-161)/(77-97) 141/77 (08/12 1558) SpO2:  [95 %-100 %] 99 % (08/12 1558) Weight:  [111 lb 15.9 oz (50.8 kg)] 111 lb 15.9 oz (50.8 kg) (08/12 0230)  Physical Exam: General: Alert and awake, oriented x3, not in any acute distress. HEENT: anicteric sclera, pupils reactive to light and accommodation left ptosis and eye deviation CVS regular rate, normal r,  no murmur rubs or gallops Chest: fairly clear to auscultation anteriorly  Abdomen: soft nontender, nondistended, normal bowel sounds, Extremities: no  clubbing or edema noted bilaterally Skin: no rashes Neuro: nonfocal  CBC: CBC Latest Ref Rng & Units 06/28/2017 06/27/2017 06/26/2017  WBC 4.0 - 10.5 K/uL 3.1(L) 2.4(L) 1.5(L)  Hemoglobin 13.0 - 17.0 g/dL 8.9(L) 8.4(L) 8.4(L)  Hematocrit 39.0 - 52.0 % 26.8(L) 25.7(L) 25.1(L)  Platelets 150 - 400 K/uL 200 184 174      BMET  Recent Labs  06/27/17 0222 06/28/17 0119  NA 132* 131*  K 3.7 3.8  CL 102 101  CO2 24 23  GLUCOSE 103* 99  BUN 7 6  CREATININE 0.53* 0.48*  CALCIUM 8.2* 8.2*     Liver Panel   Recent Labs  06/27/17 1330  PROT 7.4  ALBUMIN 2.2*  AST 53*  ALT 47  ALKPHOS 77  BILITOT 0.2*  BILIDIR <0.1*  IBILI NOT CALCULATED       Sedimentation Rate No results for input(s): ESRSEDRATE in the last 72 hours. C-Reactive Protein No results for input(s): CRP in the last 72 hours.  Micro Results: Recent Results (from the past 720 hour(s))  Acid Fast Smear (AFB)     Status: None   Collection Time: 06/17/17 11:29 PM  Result Value Ref Range Status   AFB Specimen  Processing Concentration  Final   Acid Fast Smear Negative  Final    Comment: (NOTE) Performed At: [address]  [place], [place] [id number] Lindon Romp MD AJ:[id number]    Source (AFB) SPUTUM  Final  Culture, expectorated sputum-assessment     Status: None   Collection Time: 06/18/17  5:00 AM  Result Value Ref Range Status   Specimen Description SPUTUM  Final   Special Requests Immunocompromised  Final   Sputum evaluation   Final    Sputum specimen not acceptable for testing.  Please recollect.   Gram Stain Report Called to,Read Back By and Verified With: RN Anna Genre [phone] MLM    Report Status 06/18/2017 FINAL  Final  Acid Fast Smear (AFB)     Status: None   Collection Time: 06/18/17  5:00 AM  Result Value Ref Range Status   AFB Specimen Processing Concentration  Final   Acid Fast Smear Negative  Final    Comment: (NOTE) Performed At: [facility] [address] [place], [place] [id number] Lindon Romp MD [id number]    Source (AFB) SPUTUM  Final  Culture, blood (Routine X 2) w Reflex to ID Panel     Status: Abnormal   Collection Time: 06/18/17  9:35 AM  Result Value Ref Range Status   Specimen Description BLOOD LEFT HAND  Final   Special Requests   Final    BOTTLES DRAWN AEROBIC ONLY Blood Culture results may not be optimal due to an inadequate volume of blood received in culture bottles   Culture  Setup Time   Final    BUDDING YEAST SEEN AEROBIC BOTTLE ONLY CRITICAL RESULT CALLED TO, READ BACK BY AND VERIFIED WITH: N JOHNSTON 06/22/17 @ 1028 M VESTAL    Culture (A)  Final    CRYPTOCOCCUS NEOFORMANS CRITICAL RESULT CALLED TO, READ BACK BY AND VERIFIED WITH: Mellody Memos, RN AT 1500 ON 06/24/17 BY C. JESSUP, MLT.    Report Status 06/24/2017 FINAL  Final  Blood Culture ID Panel (Reflexed)     Status: None   Collection Time: 06/18/17  9:35 AM  Result Value Ref Range Status   Enterococcus species NOT DETECTED NOT DETECTED Final    Listeria monocytogenes NOT DETECTED NOT DETECTED Final   Staphylococcus species NOT DETECTED NOT DETECTED Final   Staphylococcus aureus NOT DETECTED NOT DETECTED Final   Streptococcus species NOT DETECTED NOT DETECTED Final   Streptococcus agalactiae NOT DETECTED NOT DETECTED Final   Streptococcus pneumoniae NOT DETECTED NOT DETECTED Final   Streptococcus pyogenes NOT DETECTED NOT DETECTED Final   Acinetobacter baumannii NOT DETECTED NOT DETECTED Final   Enterobacteriaceae species NOT DETECTED NOT DETECTED Final   Enterobacter cloacae complex NOT DETECTED NOT DETECTED Final   Escherichia coli NOT DETECTED NOT DETECTED Final   Klebsiella oxytoca NOT DETECTED NOT DETECTED Final   Klebsiella pneumoniae NOT DETECTED NOT DETECTED Final   Proteus species NOT DETECTED NOT DETECTED Final   Serratia marcescens NOT DETECTED NOT DETECTED Final   Haemophilus influenzae NOT DETECTED NOT DETECTED Final   Neisseria meningitidis NOT DETECTED NOT DETECTED Final   Pseudomonas aeruginosa NOT DETECTED NOT DETECTED Final   Candida albicans NOT DETECTED NOT DETECTED Final   Candida glabrata NOT DETECTED NOT DETECTED Final   Candida krusei NOT DETECTED NOT DETECTED Final   Candida parapsilosis NOT DETECTED NOT DETECTED Final   Candida tropicalis NOT DETECTED NOT DETECTED Final  Culture, blood (Routine X 2) w Reflex to ID Panel     Status: Abnormal   Collection Time: 06/18/17  9:36 AM  Result Value Ref Range Status   Specimen Description  BLOOD LEFT ARM  Final   Special Requests   Final    BOTTLES DRAWN AEROBIC ONLY Blood Culture results may not be optimal due to an inadequate volume of blood received in culture bottles   Culture  Setup Time   Final    BUDDING YEAST SEEN AEROBIC BOTTLE ONLY CRITICAL RESULT CALLED TO, READ BACK BY AND VERIFIED WITH: N JOHNSTON 06/22/17 @ 1028 M VESTAL    Culture (A)  Final    CRYPTOCOCCUS NEOFORMANS CRITICAL RESULT CALLED TO, READ BACK BY AND VERIFIED WITH: Mellody Memos, RN AT  1500 ON 06/24/17 BY C. JESSUP, MLT.    Report Status 06/24/2017 FINAL  Final  MRSA PCR Screening     Status: None   Collection Time: 06/19/17 12:18 PM  Result Value Ref Range Status   MRSA by PCR NEGATIVE NEGATIVE Final    Comment:        The GeneXpert MRSA Assay (FDA approved for NASAL specimens only), is one component of a comprehensive MRSA colonization surveillance program. It is not intended to diagnose MRSA infection nor to guide or monitor treatment for MRSA infections.   Acid Fast Smear (AFB)     Status: None   Collection Time: 06/19/17  1:05 PM  Result Value Ref Range Status   AFB Specimen Processing Concentration  Final   Acid Fast Smear Negative  Final    Comment: (NOTE) Performed At: [facility] [place], [place] [id number] Lindon Romp MD RA:[id number]    Source (AFB) BRONCHIAL ALVEOLAR LAVAGE  Final  Acid Fast Smear (AFB)     Status: None   Collection Time: 06/19/17  1:05 PM  Result Value Ref Range Status   AFB Specimen Processing Concentration  Final   Acid Fast Smear Negative  Final    Comment: (NOTE) Performed At: [facility] [place], [place] [id number] Lindon Romp MD [id number]    Source (AFB) BRONCHIAL ALVEOLAR LAVAGE  Final  Culture, fungus without smear     Status: Abnormal (Preliminary result)   Collection Time: 06/19/17  1:05 PM  Result Value Ref Range Status   Specimen Description BRONCHIAL ALVEOLAR LAVAGE  Final   Special Requests Immunocompromised  Final   Culture CANDIDA ALBICANS (A)  Final   Report Status PENDING  Incomplete  Pneumocystis smear by DFA     Status: None   Collection Time: 06/19/17  1:05 PM  Result Value Ref Range Status   Specimen Source-PJSRC BRONCHIAL ALVEOLAR LAVAGE  Final   Pneumocystis jiroveci Ag NEGATIVE  Final    Comment: Performed at [facility]  Culture, bal-quantitative     Status: Abnormal   Collection Time: 06/19/17  1:05 PM  Result  Value Ref Range Status   Specimen Description BRONCHIAL ALVEOLAR LAVAGE  Final   Special Requests NONE  Final   Gram Stain   Final    ABUNDANT WBC PRESENT, PREDOMINANTLY PMN MODERATE GRAM VARIABLE ROD MODERATE GRAM POSITIVE COCCI IN PAIRS RARE BUDDING YEAST SEEN    Culture 20,000 COLONIES/mL PSEUDOMONAS AERUGINOSA (A)  Final   Report Status 06/21/2017 FINAL  Final   Organism ID, Bacteria PSEUDOMONAS AERUGINOSA (A)  Final      Susceptibility   Pseudomonas aeruginosa - MIC*    CEFTAZIDIME 4 SENSITIVE Sensitive     CIPROFLOXACIN <=0.25 SENSITIVE Sensitive     GENTAMICIN <=1 SENSITIVE Sensitive     IMIPENEM 1 SENSITIVE Sensitive     PIP/TAZO 8 SENSITIVE Sensitive  CEFEPIME 2 SENSITIVE Sensitive     * 20,000 COLONIES/mL PSEUDOMONAS AERUGINOSA  CSF culture with Stat gram stain     Status: None   Collection Time: 06/19/17  1:30 PM  Result Value Ref Range Status   Specimen Description CSF  Final   Special Requests NONE  Final   Gram Stain   Final    CYTOSPIN SMEAR WBC PRESENT, PREDOMINANTLY MONONUCLEAR YEAST CRITICAL RESULT CALLED TO, READ BACK BY AND VERIFIED WITH: Jessie Foot RN AT 0345 ON [id number] BY SJW    Culture FEW CRYPTOCOCCUS NEOFORMANS  Final   Report Status 06/22/2017 FINAL  Final  Fungus Culture With Stain     Status: None (Preliminary result)   Collection Time: 06/19/17  1:30 PM  Result Value Ref Range Status   Fungus Stain Final report  Final   Fungus (Mycology) Culture Preliminary report  Final    Comment: (NOTE) Performed At: [facility] [place], [place] [id number] Lindon Romp MD BJ:[id number]    Fungal Source PENDING  Incomplete  Fungus Culture Result     Status: None   Collection Time: 06/19/17  1:30 PM  Result Value Ref Range Status   Result 1 Comment  Final    Comment: (NOTE) Fungal elements, such as arthroconidia, hyphal fragments, chlamydoconidia, observed. POSITIVE SMEAR REPORTED TO LESLEY B. AT 1115 ON 05/22/17 BY  AM Performed At: [facility] [place], [place] [id number] Lindon Romp MD GE:[id number]   Fungal organism reflex     Status: None   Collection Time: 06/19/17  1:30 PM  Result Value Ref Range Status   Fungal result 1 Cryptococcus species  Corrected    Comment: (NOTE) Identification to follow. PRELIMINARY POSITIVE CULTURE REPORT CALLED TO MATTHEW V. 06-23-17 AT 1105, FAX TO [phone]   KMP PRELIMINARY IDENTIFICATION REPORT CALLED TO MATTHEW V. 06-23-17 AT 1422, FAX WAS NOT REQUESTED   KMP Performed At: [facility] [place], [place] [id number] Lindon Romp MD ZD:[id number] CORRECTED ON 08/07 AT 1435: PREVIOUSLY REPORTED AS Comment   Acid Fast Smear (AFB)     Status: None   Collection Time: 06/21/17 12:12 PM  Result Value Ref Range Status   AFB Specimen Processing Concentration  Final   Acid Fast Smear Negative  Final    Comment: (NOTE) Performed At: [facility] [place], [place] [id number] Lindon Romp MD [id number]:[id number]    Source (AFB) TRACHEAL ASPIRATE  Final  CSF culture     Status: Abnormal   Collection Time: 06/22/17  4:25 PM  Result Value Ref Range Status   Specimen Description CSF  Final   Special Requests NONE  Final   Gram Stain   Final    CYTOSPIN SMEAR WBC PRESENT,BOTH PMN AND MONONUCLEAR ENCAPSULATED YEAST SEEN CRITICAL RESULT CALLED TO, READ BACK BY AND VERIFIED WITH: Heath Lark RN 17:10 06/22/17 ([email or web address])    Culture (A)  Final    CRYPTOCOCCUS NEOFORMANS CRITICAL VALUE NOTED.  VALUE IS CONSISTENT WITH PREVIOUSLY REPORTED AND CALLED VALUE.    Report Status 06/26/2017 FINAL  Final  Fungus Culture With Stain     Status: None (Preliminary result)   Collection Time: 06/22/17  4:25 PM  Result Value Ref Range Status   Fungus Stain Final report  Final    Comment: (NOTE) Performed At: [facility] [place], [place] [id number] Lindon Romp MD [id number]     Fungus (Mycology) Culture PENDING  Incomplete  Fungal Source CSF  Final  Fungus Culture Result     Status: None   Collection Time: 06/22/17  4:25 PM  Result Value Ref Range Status   Result 1 Comment  Final    Comment: (NOTE) KOH/Calcofluor preparation:  no fungus observed. Performed At: [facility] [place], [place] [id number] Lindon Romp MD VP:[id number]   Culture, fungus without smear     Status: Abnormal (Preliminary result)   Collection Time: 06/24/17  5:35 PM  Result Value Ref Range Status   Specimen Description CSF  Final   Special Requests NONE  Final   Culture CRYPTOCOCCUS NEOFORMANS (A)  Final   Report Status PENDING  Incomplete    Studies/Results: Dg Swallowing Func-speech Pathology  Result Date: 06/28/2017 Objective Swallowing Evaluation: Type of Study: MBS-Modified Barium Swallow Study Patient Details Name: Kenneth Rivas MRN: [id number] Date of Birth: 01-02-1961 Today's Date: 06/28/2017 Time: SLP Start Time (ACUTE ONLY): 1025-SLP Stop Time (ACUTE ONLY): 1045 SLP Time Calculation (min) (ACUTE ONLY): 20 min Past Medical History: Past Medical History: Diagnosis Date . AIDS ([place])  . Anemia  . Anxiety  . Cellulitis of right hand 06/23/2017 . Hepatitis B  . HIV disease ([place])  . Hypertension  . Immune deficiency disorder [facility])  Past Surgical History: Past Surgical History: Procedure Laterality Date . HERNIA REPAIR   HPI: 56 y.o.male w/ significant history of HIV/AIDs (last CD4 un-detectable), hep B, cocaine abuse and non-compliance w/ his tivicay and truvada. Admitted on 7/31 w/ cc: weakness and FTT. Admitting dx: FTT and cachexia d/t AIDs, pulmonary nodules, bedbugs, and HTN. Had CT chest showing cavitary lung lesion in the RML. 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 Intubated 8/3-8/11.  Subjective: alert, cooperative Assessment / Plan / Recommendation CHL IP CLINICAL IMPRESSIONS 06/28/2017  Clinical Impression Patient presents with an acute, reversible pharyngeal dysphagia s/p 9 day intubation, and suspected esophageal dysphagia. Pt's initial swallows of puree, honey and nectar thick liquids appeared WFL, but as study progressed, suspect pt impacted by fatigue as reduced base of tongue retraction, reduced pharyngeal constriction and decreased hyolaryngeal excursion contributed to mod-severe residue across consistencies which was penetrated and ultimately silently aspirated after the swallow. Pharyngoesophageal segment remarkable for reduced amplitude/duration of opening, impeding bolus flow. Intermittent backflow through the UES into the pharynx noted. One instance of suspected backflow into larynx between frames, as subsequent frame showed pooling of contrast just above UES, in laryngeal vestibule and on vocal cords despite complete oral/pharyngeal clearance in prior frame. Pt's cough marginally effective for clearing airway, however with fatigue he was unable to sufficiently clear pharynx with cued dry swallows, resulting in subsequent penetration, likely trace aspiration of diffused penetrate. Recommend pt remain NPO with NG tube, with occasional ice chips after oral care to facilitate use of swallowing musculature. SLP will follow; pt may benefit from RMST, repeat instrumental assessment to determine readiness for diet initiation. He may benefit from an esophageal assessment given ?esophageal dysphagia.  SLP Visit Diagnosis Dysphagia, oropharyngeal phase (R13.12);Dysphagia, pharyngoesophageal phase (R13.14) Attention and concentration deficit following -- Frontal lobe and executive function deficit following -- Impact on safety and function Severe aspiration risk   CHL IP TREATMENT RECOMMENDATION 06/28/2017 Treatment Recommendations F/U MBS in --- days (Comment);Therapy as outlined in treatment plan below   Prognosis 06/28/2017 Prognosis for Safe Diet Advancement Good Barriers to Reach Goals --  Barriers/Prognosis Comment -- CHL IP DIET RECOMMENDATION 06/28/2017 SLP Diet Recommendations NPO;Ice chips PRN after oral care;Alternative means -  temporary Liquid Administration via -- Medication Administration Via alternative means Compensations -- Postural Changes --   CHL IP OTHER RECOMMENDATIONS 06/28/2017 Recommended Consults -- Oral Care Recommendations Oral care QID;Other (Comment) Other Recommendations --   CHL IP FOLLOW UP RECOMMENDATIONS 06/28/2017 Follow up Recommendations Skilled Nursing facility   [facility] IP FREQUENCY AND DURATION 06/28/2017 Speech Therapy Frequency (ACUTE ONLY) min 2x/week Treatment Duration 2 weeks      CHL IP ORAL PHASE 06/28/2017 Oral Phase WFL Oral - Pudding Teaspoon -- Oral - Pudding Cup -- Oral - Honey Teaspoon -- Oral - Honey Cup -- Oral - Nectar Teaspoon -- Oral - Nectar Cup -- Oral - Nectar Straw -- Oral - Thin Teaspoon -- Oral - Thin Cup -- Oral - Thin Straw -- Oral - Puree -- Oral - Mech Soft -- Oral - Regular -- Oral - Multi-Consistency -- Oral - Pill -- Oral Phase - Comment --  CHL IP PHARYNGEAL PHASE 06/28/2017 Pharyngeal Phase Impaired Pharyngeal- Pudding Teaspoon -- Pharyngeal -- Pharyngeal- Pudding Cup -- Pharyngeal -- Pharyngeal- Honey Teaspoon Delayed swallow initiation-vallecula;Reduced pharyngeal peristalsis;Reduced epiglottic inversion;Reduced laryngeal elevation;Reduced tongue base retraction;Penetration/Apiration after swallow;Pharyngeal residue - valleculae;Pharyngeal residue - pyriform;Pharyngeal residue - cp segment Pharyngeal -- Pharyngeal- Honey Cup Delayed swallow initiation-vallecula;Reduced pharyngeal peristalsis;Reduced epiglottic inversion;Reduced laryngeal elevation;Reduced tongue base retraction;Penetration/Apiration after swallow;Pharyngeal residue - valleculae;Pharyngeal residue - pyriform;Pharyngeal residue - cp segment Pharyngeal Material enters airway, passes BELOW cords without attempt by patient to eject out (silent aspiration);Material enters airway,  remains ABOVE vocal cords and not ejected out Pharyngeal- Nectar Teaspoon -- Pharyngeal -- Pharyngeal- Nectar Cup Delayed swallow initiation-vallecula;Delayed swallow initiation-pyriform sinuses;Reduced pharyngeal peristalsis;Reduced laryngeal elevation;Reduced tongue base retraction;Penetration/Apiration after swallow;Pharyngeal residue - valleculae;Pharyngeal residue - pyriform;Pharyngeal residue - cp segment Pharyngeal Material enters airway, remains ABOVE vocal cords and not ejected out Pharyngeal- Nectar Straw Delayed swallow initiation-pyriform sinuses;Reduced pharyngeal peristalsis;Reduced laryngeal elevation;Reduced tongue base retraction;Penetration/Apiration after swallow;Pharyngeal residue - valleculae;Pharyngeal residue - pyriform;Pharyngeal residue - cp segment Pharyngeal -- Pharyngeal- Thin Teaspoon -- Pharyngeal -- Pharyngeal- Thin Cup Delayed swallow initiation-pyriform sinuses;Reduced epiglottic inversion;Reduced laryngeal elevation;Reduced tongue base retraction;Penetration/Apiration after swallow;Pharyngeal residue - valleculae;Pharyngeal residue - pyriform;Pharyngeal residue - cp segment Pharyngeal Material enters airway, remains ABOVE vocal cords and not ejected out Pharyngeal- Thin Straw -- Pharyngeal -- Pharyngeal- Puree Delayed swallow initiation-vallecula;Reduced pharyngeal peristalsis;Reduced epiglottic inversion;Reduced anterior laryngeal mobility;Reduced laryngeal elevation;Reduced tongue base retraction;Penetration/Apiration after swallow;Trace aspiration;Pharyngeal residue - valleculae;Pharyngeal residue - pyriform;Pharyngeal residue - cp segment Pharyngeal Material enters airway, passes BELOW cords without attempt by patient to eject out (silent aspiration) Pharyngeal- Mechanical Soft -- Pharyngeal -- Pharyngeal- Regular Delayed swallow initiation-pyriform sinuses;Reduced pharyngeal peristalsis;Reduced epiglottic inversion;Reduced laryngeal elevation;Reduced tongue base  retraction;Penetration/Apiration after swallow;Trace aspiration;Pharyngeal residue - valleculae;Pharyngeal residue - pyriform;Pharyngeal residue - cp segment Pharyngeal Material enters airway, passes BELOW cords without attempt by patient to eject out (silent aspiration) Pharyngeal- Multi-consistency -- Pharyngeal -- Pharyngeal- Pill -- Pharyngeal -- Pharyngeal Comment --  CHL IP CERVICAL ESOPHAGEAL PHASE 06/28/2017 Cervical Esophageal Phase Impaired Pudding Teaspoon -- Pudding Cup -- Honey Teaspoon Reduced cricopharyngeal relaxation;Esophageal backflow into the pharynx Honey Cup Reduced cricopharyngeal relaxation;Esophageal backflow into the larynx Nectar Teaspoon -- Nectar Cup Reduced cricopharyngeal relaxation;Esophageal backflow into the pharynx Nectar Straw -- Thin Teaspoon -- Thin Cup Reduced cricopharyngeal relaxation;Esophageal backflow into the pharynx Thin Straw -- Puree Reduced cricopharyngeal relaxation Mechanical Soft -- Regular Reduced cricopharyngeal relaxation Multi-consistency -- Pill -- Cervical Esophageal Comment -- Kenneth Lever, MS, CCC-SLP Speech-Language Pathologist [phone] No flowsheet data found. 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  #1 Cryptococcal meningitis: MRI shows patchy areas of leptomeningeal enhancement consistent with his known meningitis from cryptococcus. Specific cause of his cranial nerve palsy not seen but inferred due to infection  He is approaching 2 weeks of amphotericin, flucytosine was abondoned due toxicity  After 14 days ampho change him to high-dose fluconazole  I think he should have a repeat LP on Monday with documented opening pressure and removal of roughly 30 mL of CSF, documentation of closing pressure.  #2 HIV/AIDS: will start ARV  5 weeks into his crypto treatment. I have more confidence that NOW he is indeedmotivated  #3 Bacterial PNA: finish antibacterial abx  #4 FTT: I think he is clinically going to need to be placed into facility for rehabilitation while recuperating from the severe CNS infection.          LOS: 11 days   Kenneth Rivas 06/28/2017, 5:52 PM

## 2017-06-28 NOTE — Evaluation (Signed)
Clinical/Bedside Swallow Evaluation Patient Details  Name: Kenneth Rivas MRN: [id number] Date of Birth: 1961/03/24  Today's Date: 06/28/2017 Time: SLP Start Time (ACUTE ONLY): 0840 SLP Stop Time (ACUTE ONLY): 0855 SLP Time Calculation (min) (ACUTE ONLY): 15 min  Past Medical History:  Past Medical History:  Diagnosis Date  . AIDS (HCC)   . Anemia   . Anxiety   . Cellulitis of right hand 06/23/2017  . Hepatitis B   . HIV disease (HCC)   . Hypertension   . Immune deficiency disorder [facility])    Past Surgical History:  Past Surgical History:  Procedure Laterality Date  . HERNIA REPAIR     HPI:  56 y.o.male w/ significant history of HIV/AIDs (last CD4 un-detectable), hep B, cocaine abuse and non-compliance w/ his tivicay and truvada. Admitted on 7/31 w/ cc: weakness and FTT. Admitting dx: FTT and cachexia d/t AIDs, pulmonary nodules, bedbugs, and HTN. Had CT chest showing cavitary lung lesion in the RML. 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 Intubated 8/3-8/11.    Assessment / Plan / Recommendation Clinical Impression  Patient presents with what is likely an acute, reversible dysphagia s/p 9 day intubation. He is alert, cooperative, follows all basic commands. Voice is clear, cough strong, however pt at severe risk for aspiration given his prolonged hospital course, deconditioning, and clinical signs of aspiration at bedside. Pt initially tolerates ice chips with no overt signs of aspiration, however with single sips of water he presents with delayed throat clearing, suggestive of reduced airway protection. With 3 oz water swallow challenge, pt presents with multiple swallows, immediate throat clearing and wet vocal quality. No overt signs of aspiration with pureed solids. Given pt's risk for aspiration, recommend proceeding with instrumental testing prior to diet initiation. Educated pt re: MBS, FEES, he expresses desire to pursue  MBS this date. Test to be performed later this morning as fluoro schedule permits. Recommend pt remain NPO pending instrumental testing. Additional recommendations to follow.  SLP Visit Diagnosis: Dysphagia, unspecified (R13.10)    Aspiration Risk  Severe aspiration risk    Diet Recommendation NPO   Medication Administration: Via alternative means    Other  Recommendations Oral Care Recommendations: Oral care QID   Follow up Recommendations        Frequency and Duration            Prognosis Prognosis for Safe Diet Advancement: Good      Swallow Study   General Date of Onset: 06/16/17 HPI: 56 y.o.male w/ significant history of HIV/AIDs (last CD4 un-detectable), hep B, cocaine abuse and non-compliance w/ his tivicay and truvada. Admitted on 7/31 w/ cc: weakness and FTT. Admitting dx: FTT and cachexia d/t AIDs, pulmonary nodules, bedbugs, and HTN. Had CT chest showing cavitary lung lesion in the RML. 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 Intubated 8/3-8/11.  Type of Study: Bedside Swallow Evaluation Previous Swallow Assessment: none in chart Diet Prior to this Study: NPO;NG Tube Temperature Spikes Noted: No Respiratory Status: Nasal cannula History of Recent Intubation: Yes Length of Intubations (days): 9 days Date extubated: 06/27/17 Behavior/Cognition: Alert;Cooperative Oral Cavity Assessment: Within Functional Limits Oral Care Completed by SLP: Yes Oral Cavity - Dentition: Adequate natural dentition Vision: Functional for self-feeding Self-Feeding Abilities: Able to feed self Patient Positioning: Upright in bed Baseline Vocal Quality: Normal Volitional Cough: Strong Volitional Swallow: Able to elicit    Oral/Motor/Sensory Function Overall Oral Motor/Sensory Function: Within functional limits  Ice Chips Ice chips: Within functional limits   Thin Liquid Thin Liquid: Impaired Presentation: Straw;Cup;Self  Fed Pharyngeal  Phase Impairments: Wet Vocal Quality;Throat Clearing - Delayed;Throat Clearing - Immediate;Multiple swallows;Suspected delayed Swallow    Nectar Thick Nectar Thick Liquid: Not tested   Honey Thick Honey Thick Liquid: Not tested   Puree Puree: Within functional limits Presentation: Spoon;Self Fed   Solid   GO   Kenneth HaleMary Beth Valley MillsBardin, TennesseeMS, CCC-SLP Speech-Language Pathologist [phone] Solid: Not tested        Kenneth LindauMary E Ingeborg Rivas 06/28/2017,9:10 AM

## 2017-06-29 ENCOUNTER — Inpatient Hospital Stay (HOSPITAL_COMMUNITY): Payer: Medicaid Other

## 2017-06-29 DIAGNOSIS — G039 Meningitis, unspecified: Secondary | ICD-10-CM

## 2017-06-29 LAB — CBC WITH DIFFERENTIAL/PLATELET
BASOS ABS: 0 10*3/uL (ref 0.0–0.1)
BASOS PCT: 1 %
EOS ABS: 0.1 10*3/uL (ref 0.0–0.7)
Eosinophils Relative: 5 %
HCT: 26.5 % — ABNORMAL LOW (ref 39.0–52.0)
Hemoglobin: 8.7 g/dL — ABNORMAL LOW (ref 13.0–17.0)
LYMPHS PCT: 29 %
Lymphs Abs: 0.8 10*3/uL (ref 0.7–4.0)
MCH: 26.2 pg (ref 26.0–34.0)
MCHC: 32.8 g/dL (ref 30.0–36.0)
MCV: 79.8 fL (ref 78.0–100.0)
MONO ABS: 0.1 10*3/uL (ref 0.1–1.0)
Monocytes Relative: 5 %
NEUTROS ABS: 1.8 10*3/uL (ref 1.7–7.7)
NEUTROS PCT: 60 %
PLATELETS: 244 10*3/uL (ref 150–400)
RBC: 3.32 MIL/uL — ABNORMAL LOW (ref 4.22–5.81)
RDW: 14.3 % (ref 11.5–15.5)
WBC: 2.8 10*3/uL — ABNORMAL LOW (ref 4.0–10.5)

## 2017-06-29 LAB — BASIC METABOLIC PANEL
Anion gap: 7 (ref 5–15)
BUN: 7 mg/dL (ref 6–20)
CALCIUM: 8.6 mg/dL — AB (ref 8.9–10.3)
CO2: 26 mmol/L (ref 22–32)
CREATININE: 0.5 mg/dL — AB (ref 0.61–1.24)
Chloride: 100 mmol/L — ABNORMAL LOW (ref 101–111)
GFR calc Af Amer: >60 mL/min (ref >60)
GLUCOSE: 102 mg/dL — AB (ref 65–99)
Potassium: 4 mmol/L (ref 3.5–5.1)
SODIUM: 133 mmol/L — AB (ref 135–145)

## 2017-06-29 LAB — GLUCOSE, CAPILLARY
GLUCOSE-CAPILLARY: 87 mg/dL (ref 65–99)
GLUCOSE-CAPILLARY: 144 mg/dL — AB (ref 65–99)

## 2017-06-29 LAB — PHOSPHORUS: PHOSPHORUS: 4 mg/dL (ref 2.5–4.6)

## 2017-06-29 LAB — MAGNESIUM: MAGNESIUM: 1.6 mg/dL — AB (ref 1.7–2.4)

## 2017-06-29 MED ORDER — JEVITY 1.2 CAL PO LIQD
1000.0000 mL | ORAL | Status: DC
  Administered 2017-06-29 – 2017-07-02 (×4): 1000 mL
  Filled 2017-06-29 (×9): qty 1000

## 2017-06-29 MED ORDER — MAGNESIUM SULFATE 2 GM/50ML IV SOLN
2.0000 g | Freq: Once | INTRAVENOUS | Status: AC
  Administered 2017-06-29: 2 g via INTRAVENOUS
  Filled 2017-06-29: qty 50

## 2017-06-29 MED ORDER — LIDOCAINE HCL (PF) 1 % IJ SOLN
5.0000 mL | Freq: Once | INTRAMUSCULAR | Status: AC
  Administered 2017-06-30: 5 mL
  Filled 2017-06-29 (×2): qty 5

## 2017-06-29 MED ORDER — LIDOCAINE HCL (PF) 1 % IJ SOLN
INTRAMUSCULAR | Status: AC
  Filled 2017-06-29: qty 5

## 2017-06-29 NOTE — Progress Notes (Signed)
  Speech Language Pathology Treatment: Dysphagia  Patient Details Name: Kenneth Rivas MRN: [id number] DOB: February 14, 1961 Today's Date: 06/29/2017 Time: [id number] SLP Time Calculation (min) (ACUTE ONLY): 28 min  Assessment / Plan / Recommendation Clinical Impression  Initial assessment for RMT (respiratory muscle strength training). Pt cognitively able to comprehend and attempt/execute instructions however exhibited labial weakness, mildly incomplete seal with bilateral labial air leakage. Manual manometer was used to obtain pt's MIP and MEP to compare to pt's predicted/reference values and need for treatment. Given pt's reduced pharyngeal constriction and decreased hyolaryngeal excursion pt would benefit most from EMT and pressures were set on trainer (70% of MEP) at 3.5. He required mild tactile assist to facilitate labial seal. Pt educated to practice minimum of 3 times a day for 10 repetitions (will move up as pt improves).   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 Admitted on 7/31 w/ cc: weakness and FTT. Admitting dx: FTT and cachexia d/t AIDs, pulmonary nodules, bedbugs, and HTN. Had CT chest showing cavitary lung lesion in the RML. 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 Intubated 8/3-8/11.       SLP Plan  Continue with current plan of care       Recommendations  Diet recommendations: NPO Medication Administration: Via alternative means                Oral Care Recommendations: Oral care QID Follow up Recommendations: Skilled Nursing facility SLP Visit Diagnosis: Dysphagia, oropharyngeal phase (R13.12);Dysphagia, pharyngoesophageal phase (R13.14) Plan: Continue with current plan of care       GO                Royce MacadamiaLitaker, Seith Aikey Willis 06/29/2017, 11:46 AM  Breck CoonsLisa Willis Lonell FaceLitaker M.Ed ITT IndustriesCCC-SLP Pager  [phone]

## 2017-06-29 NOTE — Progress Notes (Signed)
Physical Therapy Treatment Patient Details Name: Kenneth Rivas MRN: [id number] DOB: [birth year] Today's Date: 06/29/2017    History of Present Illness 56 y.o. 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 Admitted on 7/31 w/ cc: weakness and FTT. Admitting dx: FTT and cachexia d/t AIDs, pulmonary nodules, bedbugs, and HTN. Had CT chest showing cavitary lung lesion in the RML. 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 Intubated 8/3-8/11.    PT Comments    Pt finally able to participate.  Pt very debilitated, but able to work on bed mobility, sit to stand and gait.  Pt improved within session today.    Follow Up Recommendations  SNF;Supervision/Assistance - 24 hour     Equipment Recommendations  Other (comment) (TBA)    Recommendations for Other Services       Precautions / Restrictions Precautions Precautions: Fall    Mobility  Bed Mobility Overal bed mobility: Needs Assistance Bed Mobility: Supine to Sit     Supine to sit: Min assist     General bed mobility comments: pt moved legs off bed once therapist initiated movement.  assisted trunk up and forward.  Transfers Overall transfer level: Needs assistance   Transfers: Sit to/from Stand Sit to Stand: Mod assist         General transfer comment: cues for hand placement, stability assist, more assist forward than boosting up.  Ambulation/Gait Ambulation/Gait assistance: Mod assist Ambulation Distance (Feet): 10 Feet (then addidtional 10 feet and 60 feet.) Assistive device: Rolling walker (2 wheeled) Gait Pattern/deviations: Step-through pattern Gait velocity: slower Gait velocity interpretation: Below normal speed for age/gender General Gait Details: mild to moderately unsteady at times with instability at knees, but with improvement during time up. Pt needed postural  assist and help controlling RW.   Stairs            Wheelchair Mobility    Modified Rankin (Stroke Patients Only)       Balance Overall balance assessment: Needs assistance Sitting-balance support: No upper extremity supported;Feet supported Sitting balance-Leahy Scale: Fair Sitting balance - Comments: Sitting at EOB no back support statically. Assist for dynamic.    Standing balance support: Bilateral upper extremity supported Standing balance-Leahy Scale: Poor Standing balance comment: Reliant on external assistance.                             Cognition Arousal/Alertness: Awake/alert Behavior During Therapy: Flat affect Overall Cognitive Status: No family/caregiver present to determine baseline cognitive functioning                                 General Comments: Slow to respond at times.      Exercises      General Comments General comments (skin integrity, edema, etc.): pt unable to manage his secretions and drooled consistently      Pertinent Vitals/Pain Pain Assessment: 0-10 Faces Pain Scale: Hurts a little bit Pain Location: vague with warm up exercise Pain Intervention(s): Monitored during session    Home Living                      Prior Function            PT Goals (current goals can now be found in the care plan section) Acute Rehab PT  Goals Patient Stated Goal: None stated PT Goal Formulation: With patient Time For Goal Achievement: 07/08/17 Potential to Achieve Goals: Fair Progress towards PT goals: Progressing toward goals (continue with same goals)    Frequency    Min 3X/week      PT Plan Current plan remains appropriate    Co-evaluation              AM-PAC PT "6 Clicks" Daily Activity  Outcome Measure  Difficulty turning over in bed (including adjusting bedclothes, sheets and blankets)?: Total Difficulty moving from lying on back to sitting on the side of the bed? :  Total Difficulty sitting down on and standing up from a chair with arms (e.g., wheelchair, bedside commode, etc,.)?: Total Help needed moving to and from a bed to chair (including a wheelchair)?: A Lot Help needed walking in hospital room?: A Lot Help needed climbing 3-5 steps with a railing? : A Lot 6 Click Score: 9    End of Session   Activity Tolerance: Patient limited by fatigue Patient left: in bed;with call bell/phone within reach;with bed alarm set Nurse Communication: Mobility status PT Visit Diagnosis: Difficulty in walking, not elsewhere classified (R26.2);Muscle weakness (generalized) (M62.81);Other abnormalities of gait and mobility (R26.89)     Time: [id number] PT Time Calculation (min) (ACUTE ONLY): 29 min  Charges:  $Gait Training: 8-22 mins $Therapeutic Activity: 8-22 mins                    G Codes:       06/29/2017  [place] BingKen Ajani Schnieders, PT [phone]  (pager)   Eliseo GumKenneth V Sharde Gover 06/29/2017, 5:24 PM

## 2017-06-29 NOTE — Progress Notes (Signed)
Radiology call to see if the lumber puncture had been attempted on the floor at bedside first.  Stated it has to be attempted at bedside first before they can do it.Text paged Family Medicine to inform them.  Will continue to monitor.  Forbes Cellarelcine Rashawd Laskaris, RN

## 2017-06-29 NOTE — Progress Notes (Addendum)
Clinical impression: PTA, pt was independent with ADL and functional mobility and was living with a roommate. He currently demonstrates good motivation to participate with OT. He requires min assist for UB ADL, mod assist for LB ADL, and mod assist for toilet transfers this session. He presents with decreased functional use of vision due to L ptosis, decreased activity tolerance for ADL, generalized weakness, and decreased balance impacting his ability to participate in [facility]. He would best benefit from short-term SNF placement post-acute D/C in order to maximize safety and independence prior to returning home. Will continue to follow while admitted.   06/29/17 1200  OT Visit Information  Last OT Received On 06/29/17  Assistance Needed +1  History of Present Illness 56 y.o. 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 Admitted on 7/31 w/ cc: weakness and FTT. Admitting dx: FTT and cachexia d/t AIDs, pulmonary nodules, bedbugs, and HTN. Had CT chest showing cavitary lung lesion in the RML. 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 Intubated 8/3-8/11.  Precautions  Precautions Fall  Restrictions  Weight Bearing Restrictions No  Home Living  Family/patient expects to be discharged to: Other (Comment) (rooming house per pt)  Living Arrangements Non-relatives/Friends (roommate)  Available Help at Discharge Available PRN/intermittently (roommate)  Type of Home House  Home Layout Two level  Home Equipment Walker - 2 wheels  Prior Function  Gait / Transfers Assistance Needed Reports he ambulates with RW/cane  ADL's / Homemaking Assistance Needed Reports independence to OT; roommate cooks. On initial PT evaluation, pt reporting roommate assisting with bathing/dressing.   Communication  Communication No difficulties  Pain Assessment  Pain  Assessment No/denies pain  Cognition  Arousal/Alertness Awake/alert  Behavior During Therapy Flat affect  Overall Cognitive Status No family/caregiver present to determine baseline cognitive functioning  General Comments Slow to respond at times.  Upper Extremity Assessment  Upper Extremity Assessment Generalized weakness  Lower Extremity Assessment  Lower Extremity Assessment Defer to PT evaluation  ADL  Overall ADL's  Needs assistance/impaired  Eating/Feeding NPO  Grooming Minimal assistance;Sitting  Upper Body Bathing Minimal assistance;Sitting  Lower Body Bathing Moderate assistance;Sit to/from stand  Upper Body Dressing  Minimal assistance;Sitting  Lower Body Dressing Moderate assistance;Sit to/from Scientist, research (life sciences)stand  Toilet Transfer Moderate assistance  Toilet Transfer Details (indicate cue type and reason) Sit<>stand only secondary to dizziness in standing.   Toileting- Clothing Manipulation and Hygiene Moderate assistance;Sit to/from stand  General ADL Comments Pt with significantly limited activity tolerance for ADL. Willing to participate.   Vision- History  Baseline Vision/History (L ptosis)  Patient Visual Report (L ptosis)  Vision- Assessment  Vision Assessment? Yes  Additional Comments Pt able to track with R eye and reports no blurry or double vision. Peripheral vision functionally in tact. When assisted to raise L eyelid, pt unable to track with L eye. Reports no double or blurry vision despite poor L eye movement.   Bed Mobility  Overal bed mobility Needs Assistance  Bed Mobility Supine to Sit  Supine to sit Min assist  General bed mobility comments Min assist to come to EOB this session.   Transfers  Overall transfer level Needs assistance  Equipment used 1 person hand held assist  Transfers Sit to/from Stand  Sit to Stand Mod assist  General transfer comment Mod assist for sit<>stand. Upon standing, pt becoming dizzy and declining further mobility.   Balance  Overall  balance assessment Needs assistance  Sitting-balance support No upper extremity supported;Feet supported  Sitting balance-Leahy Scale Fair  Sitting balance - Comments Sitting at EOB no back support statically. Assist for dynamic.   Standing balance support Single extremity supported  Standing balance-Leahy Scale Poor  Standing balance comment Reliant on external assistance.   OT - End of Session  Activity Tolerance Patient tolerated treatment well  Patient left in bed;with call bell/phone within reach;with bed alarm set  Nurse Communication Mobility status (Cleared for mobility prior to LP per RN)  OT Assessment  OT Recommendation/Assessment Patient needs continued OT Services  OT Visit Diagnosis Muscle weakness (generalized) (M62.81);Low vision, both eyes (H54.2)  OT Problem List Impaired balance (sitting and/or standing);Impaired vision/perception;Decreased strength;Decreased activity tolerance;Decreased safety awareness;Impaired UE functional use  OT Plan  OT Frequency (ACUTE ONLY) Min 2X/week  OT Treatment/Interventions (ACUTE ONLY) Self-care/ADL training;Therapeutic exercise;Energy conservation;DME and/or AE instruction;Therapeutic activities;Patient/family education;Balance training;Visual/perceptual remediation/compensation  AM-PAC OT "6 Clicks" Daily Activity Outcome Measure  Help from another person eating meals? 1  Help from another person taking care of personal grooming? 3  Help from another person toileting, which includes using toliet, bedpan, or urinal? 2  Help from another person bathing (including washing, rinsing, drying)? 2  Help from another person to put on and taking off regular upper body clothing? 3  Help from another person to put on and taking off regular lower body clothing? 2  6 Click Score 13  ADL G Code Conversion CL  OT Recommendation  Follow Up Recommendations SNF  OT Equipment Other (comment) (TBD at next venue of care)  Acute Rehab OT Goals  Patient  Stated Goal None stated  OT Goal Formulation With patient  Time For Goal Achievement 07/13/17  Potential to Achieve Goals Good  OT Time Calculation  OT Start Time (ACUTE ONLY) 1206  OT Stop Time (ACUTE ONLY) 1228  OT Time Calculation (min) 22 min  OT General Charges  $OT Visit 1 Procedure  OT Evaluation  $OT Eval Moderate Complexity 1 Procedure  Written Expression  Dominant Hand Right   Doristine Section, MS OTR/L  Pager: [phone]

## 2017-06-29 NOTE — Progress Notes (Signed)
Kenneth Rivas for Infectious Disease  Date of Admission:  06/16/2017   Total days of antibiotics 12        Day 12 Amphotericin        Ceftazidime 8/5-8/12        Flucytosine 8/4-8/8         ASSESSMENT and PLAN:  Disseminated cryptococcal disease- Meningitis and Pneumonia:  Left eye Ptosis MRI 8/9 showed patchy areas of leptomeningeal signal abnormality and enhancement which is consistent with acute cryptococcal meningitis. The patient's ptosis is likely 2/2 cryptococcal meningitis causing a cranial nerve defect.  Repeat chest xray 8/10 did not find any focal consolidation, pleural effusion or pneumothorax.   Possible esophageal dysphagia per barium swallow. Keep NPO  -Patient to have repeat LP today 8/13 and get 4m removed. OP and closing pressure to be collected -Stop amphotericin 8/15 and start fluconazole for 8 week therapy. Renal function is still good cr=0.5  HIV/AIDS -Will not start antiretroviral therapy due to risk of IRIS. Will consider in 5 weeks. -Continue azithromycin and bactrim as patient is on ART    . amLODipine  5 mg Per Tube Daily  . azithromycin  1,200 mg Per Tube Weekly  . chlorhexidine  15 mL Mouth Rinse BID  . dextrose  10 mL Intravenous Q24H  . dextrose  10 mL Intravenous Q24H  . heparin subcutaneous  5,000 Units Subcutaneous Q8H  . mouth rinse  15 mL Mouth Rinse q12n4p  . pantoprazole sodium  40 mg Per Tube Daily  . sennosides  5 mL Per Tube QHS  . sulfamethoxazole-trimethoprim  1 tablet Per Tube Daily    SUBJECTIVE: Mr. DKalaswas seen resting in his bed in no acute distress. He denied having any headaches, sob, or chest pain. He mentioned that he was hungry.   Review of Systems: ROS as above  No Active Allergies  OBJECTIVE: Vitals:   06/28/17 1700 06/28/17 2100 06/29/17 0304 06/29/17 0500  BP:  (!) 157/93  (!) 146/73  Pulse:  74  77  Resp:  17  17  Temp:  98.5 F (36.9 C)  98.5 F (36.9 C)  TempSrc: Oral Oral  Oral    SpO2:  98%  100%  Weight:   111 lb 6.4 oz (50.5 kg)   Height:       Body mass index is 17.98 kg/m.  Physical Exam  Constitutional: He is well-developed, well-nourished, and in no distress. He appears not jaundiced. He appears cachectic.  Eyes:  Left eye ptosis  Cardiovascular: Normal rate, regular rhythm, normal heart sounds and intact distal pulses.   Pulmonary/Chest: Effort normal and breath sounds normal.  Abdominal: Soft. Bowel sounds are normal. He exhibits no distension. There is no tenderness.  Skin: He is not diaphoretic.  Psychiatric: Mood, memory, affect and judgment normal.    Lab Results Lab Results  Component Value Date   WBC 2.8 (L) 06/29/2017   HGB 8.7 (L) 06/29/2017   HCT 26.5 (L) 06/29/2017   MCV 79.8 06/29/2017   PLT 244 06/29/2017    Lab Results  Component Value Date   CREATININE 0.50 (L) 06/29/2017   BUN 7 06/29/2017   NA 133 (L) 06/29/2017   K 4.0 06/29/2017   CL 100 (L) 06/29/2017   CO2 26 06/29/2017    Lab Results  Component Value Date   ALT 47 06/27/2017   AST 53 (H) 06/27/2017   ALKPHOS 77 06/27/2017   BILITOT 0.2 (L) 06/27/2017  Microbiology: Recent Results (from the past 240 hour(s))  MRSA PCR Screening     Status: None   Collection Time: 06/19/17 12:18 PM  Result Value Ref Range Status   MRSA by PCR NEGATIVE NEGATIVE Final    Comment:        The GeneXpert MRSA Assay (FDA approved for NASAL specimens only), is one component of a comprehensive MRSA colonization surveillance program. It is not intended to diagnose MRSA infection nor to guide or monitor treatment for MRSA infections.   Acid Fast Smear (AFB)     Status: None   Collection Time: 06/19/17  1:05 PM  Result Value Ref Range Status   AFB Specimen Processing Concentration  Final   Acid Fast Smear Negative  Final    Comment: (NOTE) Performed At: [facility] [place], [place] [id number] Lindon Romp MD QI:[id number]    Source (AFB)  BRONCHIAL ALVEOLAR LAVAGE  Final  Acid Fast Smear (AFB)     Status: None   Collection Time: 06/19/17  1:05 PM  Result Value Ref Range Status   AFB Specimen Processing Concentration  Final   Acid Fast Smear Negative  Final    Comment: (NOTE) Performed At: [facility] [place], [place] [id number] Lindon Romp MD [id number]    Source (AFB) BRONCHIAL ALVEOLAR LAVAGE  Final  Culture, fungus without smear     Status: Abnormal (Preliminary result)   Collection Time: 06/19/17  1:05 PM  Result Value Ref Range Status   Specimen Description BRONCHIAL ALVEOLAR LAVAGE  Final   Special Requests Immunocompromised  Final   Culture CANDIDA ALBICANS (A)  Final   Report Status PENDING  Incomplete  Pneumocystis smear by DFA     Status: None   Collection Time: 06/19/17  1:05 PM  Result Value Ref Range Status   Specimen Source-PJSRC BRONCHIAL ALVEOLAR LAVAGE  Final   Pneumocystis jiroveci Ag NEGATIVE  Final    Comment: Performed at [place] [facility]  Culture, bal-quantitative     Status: Abnormal   Collection Time: 06/19/17  1:05 PM  Result Value Ref Range Status   Specimen Description BRONCHIAL ALVEOLAR LAVAGE  Final   Special Requests NONE  Final   Gram Stain   Final    ABUNDANT WBC PRESENT, PREDOMINANTLY PMN MODERATE GRAM VARIABLE ROD MODERATE GRAM POSITIVE COCCI IN PAIRS RARE BUDDING YEAST SEEN    Culture 20,000 COLONIES/mL PSEUDOMONAS AERUGINOSA (A)  Final   Report Status 06/21/2017 FINAL  Final   Organism ID, Bacteria PSEUDOMONAS AERUGINOSA (A)  Final      Susceptibility   Pseudomonas aeruginosa - MIC*    CEFTAZIDIME 4 SENSITIVE Sensitive     CIPROFLOXACIN <=0.25 SENSITIVE Sensitive     GENTAMICIN <=1 SENSITIVE Sensitive     IMIPENEM 1 SENSITIVE Sensitive     PIP/TAZO 8 SENSITIVE Sensitive     CEFEPIME 2 SENSITIVE Sensitive     * 20,000 COLONIES/mL PSEUDOMONAS AERUGINOSA  CSF culture with Stat gram stain     Status: None   Collection Time:  06/19/17  1:30 PM  Result Value Ref Range Status   Specimen Description CSF  Final   Special Requests NONE  Final   Gram Stain   Final    CYTOSPIN SMEAR WBC PRESENT, PREDOMINANTLY MONONUCLEAR YEAST CRITICAL RESULT CALLED TO, READ BACK BY AND VERIFIED WITH: Jessie Foot RN AT 0345 ON [id number] BY SJW    Culture FEW CRYPTOCOCCUS NEOFORMANS  Final   Report  Status 06/22/2017 FINAL  Final  Fungus Culture With Stain     Status: None   Collection Time: 06/19/17  1:30 PM  Result Value Ref Range Status   Fungus Stain Final report  Final   Fungus (Mycology) Culture Preliminary report  Final    Comment: (NOTE) Performed At: [facility] [place], [place] [id number] Lindon Romp MD [id number]   Fungus Culture Result     Status: None   Collection Time: 06/19/17  1:30 PM  Result Value Ref Range Status   Result 1 Comment  Final    Comment: (NOTE) Fungal elements, such as arthroconidia, hyphal fragments, chlamydoconidia, observed. POSITIVE SMEAR REPORTED TO LESLEY B. AT 1115 ON 05/22/17 BY AM Performed At: [facility] [place], [place] [id number] Lindon Romp MD [id number]   Fungal organism reflex     Status: None   Collection Time: 06/19/17  1:30 PM  Result Value Ref Range Status   Fungal result 1 Cryptococcus species  Corrected    Comment: (NOTE) Identification to follow. PRELIMINARY POSITIVE CULTURE REPORT CALLED TO MATTHEW V. 06-23-17 AT 1105, FAX TO [phone]   KMP PRELIMINARY IDENTIFICATION REPORT CALLED TO MATTHEW V. 06-23-17 AT 1422, FAX WAS NOT REQUESTED   KMP Performed At: [facility] [place], [place] [id number] Lindon Romp MD YB:[id number] CORRECTED ON 08/07 AT 1435: PREVIOUSLY REPORTED AS Comment   Acid Fast Smear (AFB)     Status: None   Collection Time: 06/21/17 12:12 PM  Result Value Ref Range Status   AFB Specimen Processing Concentration  Final   Acid Fast Smear Negative  Final     Comment: (NOTE) Performed At: [facility] [place], [place] [id number] Lindon Romp MD [id number]    Source (AFB) TRACHEAL ASPIRATE  Final  CSF culture     Status: Abnormal   Collection Time: 06/22/17  4:25 PM  Result Value Ref Range Status   Specimen Description CSF  Final   Special Requests NONE  Final   Gram Stain   Final    CYTOSPIN SMEAR WBC PRESENT,BOTH PMN AND MONONUCLEAR ENCAPSULATED YEAST SEEN CRITICAL RESULT CALLED TO, READ BACK BY AND VERIFIED WITH: Heath Lark RN 17:10 06/22/17 ([email or web address])    Culture (A)  Final    CRYPTOCOCCUS NEOFORMANS CRITICAL VALUE NOTED.  VALUE IS CONSISTENT WITH PREVIOUSLY REPORTED AND CALLED VALUE.    Report Status 06/26/2017 FINAL  Final  Fungus Culture With Stain     Status: None (Preliminary result)   Collection Time: 06/22/17  4:25 PM  Result Value Ref Range Status   Fungus Stain Final report  Final    Comment: (NOTE) Performed At: [facility] [place], [place] [id number] Lindon Romp MD [id number]    Fungus (Mycology) Culture PENDING  Incomplete   Fungal Source CSF  Final  Fungus Culture Result     Status: None   Collection Time: 06/22/17  4:25 PM  Result Value Ref Range Status   Result 1 Comment  Final    Comment: (NOTE) KOH/Calcofluor preparation:  no fungus observed. Performed At: [facility] [place], [place] [id number] Lindon Romp MD IH:[id number]   Culture, fungus without smear     Status: Abnormal (Preliminary result)   Collection Time: 06/24/17  5:35 PM  Result Value Ref Range Status   Specimen Description CSF  Final   Special Requests NONE  Final   Culture CRYPTOCOCCUS NEOFORMANS (A)  Final   Report Status PENDING  Incomplete    Lars Mage, MD [facility] for [facility] [phone] pager   [phone] cell 06/29/2017, 11:05 AM

## 2017-06-29 NOTE — Progress Notes (Signed)
Family Medicine Teaching Service Daily Progress Note Intern Pager: [phone]  Patient name: Kenneth Rivas Medical record number: [id number] Date of birth: [birth year] Age: 56 y.o. Gender: male  Primary Care Provider: Tommy Medal, Lavell Islam, MD Consultants: ID Code Status: FULL  Pt Overview and Major Events to Date:  Admitted on 7/31, CD4 count 2 On 8/1, found to have cryptococcal antigen in serum On 8/3, Transferred to ICU for LP due to risk of intubation/vent, was intubated after transfer On 8/11, Extubated On 8/13, Transferred to [place] and Plan:  Kenneth Rivas a 56 y.o.malepresenting with cachexia and feeling weak and tired x past 4 weeks. PMH is significant for AIDS, hep B, cocaine use, and HTN.   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  He is on day 12 of amphotericin, flucytosine was abondoned due toxicity.   - After two weeks of amphotericin, transition to high-dose fluconazole per ID recs - repeat LP with documented opening pressure and removal of roughly 30 mL of CSF, documentation of closing pressure.  Will consult IR for LP, can be done on 8/14 - NPO due to dysphagia and failed swallow study, receiving nutrition through NG tube, dietitian to adjust  Pneumonia: Pseudomonas found on sputum culture.  Currently on ceftazidime 1g infusion TID day 8.  Also on  azithromycin 1.2 g weekly and Bactrim DS 800-160 mg daily for prophylaxis due to AIDS with CD4 of 2.   - follow ID recommendations - continue abx regimen  HTN. Hypertensive throughout hospital stay, initially attributed to recent cocaine use. At home on Norvasc 5 mg daily however patient reports as not taking.  Most current BP 145/76 on 8/13, which is similar to recent blood pressures. -Continue home Norvasc to 5 mg -Continue  Hydral 5 mg with parameters: SBP>170 or DBP>110  -Monitor blood pressures  Hepatitis B. Not currently taking medication and does not follow with Infectious Disease. LFTs with AST 19, ALT 13 and alk phos 58. Normal total bili 0.8 and direct bili 0.2.   -Continue to monitor   H/o drug use and insecure home environment.In ED, UDS +for cocaine and amphetamines. May have contributed to his elevated BP on admission depending on time of ingestion.  -CSW consulted and has been in contact with patient's sister, [name].  Will be able to make more progress on social needs now that patient has been extubated   FEN/GI: NPO due to failed barium swallow study Prophylaxis: Lovenox   Disposition:Continue on med-surg   Subjective:  Kenneth Rivas says that he feels fine this morning but is hungry.  He denies pain and has no other complaints.  Objective: Temp:  [97.7 F (36.5 C)-98.5 F (36.9 C)] 98.5 F (36.9 C) (08/13 0500) Pulse Rate:  [66-84] 77 (08/13 0500) Resp:  [14-26] 17 (08/13 0500) BP: (136-157)/(73-97) 146/73 (08/13 0500) SpO2:  [95 %-100 %] 100 % (08/13 0500) Weight:  [111 lb 6.4 oz (50.5 kg)] 111 lb 6.4 oz (50.5 kg) (08/13 0304) Physical Exam: General: ill appearing and frail, not talkative but willing to respond Cardiovascular: RRR, no MRG Respiratory: no visible increase resp effort, no coughing during exam Abdomen: soft, nontender to palpation Extremities: Frail, nails cracked w/ nail fungus with dirt underneath, visible bony joints  Laboratory:  Recent Labs Lab 06/27/17 0222 06/28/17 0119 06/29/17 0310  WBC 2.4* 3.1* 2.8*  HGB 8.4* 8.9* 8.7*  HCT 25.7* 26.8* 26.5*  PLT 184 200 244    Recent Labs Lab 06/27/17 0222 06/27/17 1330 06/28/17 0119 06/29/17 0310  NA 132*  --  131* 133*  K 3.7  --  3.8 4.0  CL 102  --  101 100*  CO2 24  --  23 26  BUN 7  --  6 7  CREATININE 0.53*  --  0.48* 0.50*  CALCIUM 8.2*  --  8.2* 8.6*  PROT  --  7.4  --   --    BILITOT  --  0.2*  --   --   ALKPHOS  --  77  --   --   ALT  --  47  --   --   AST  --  53*  --   --   GLUCOSE 103*  --  99 102*      Imaging/Diagnostic Tests: Ct Chest W Contrast  Result Date: 06/17/2017 CLINICAL DATA:  Shortness of breath. EXAM: CT CHEST WITH CONTRAST TECHNIQUE: Multidetector CT imaging of the chest was performed during intravenous contrast administration. CONTRAST:  11m ISOVUE-300 IOPAMIDOL (ISOVUE-300) INJECTION 61% COMPARISON:  Radiographs of June 16, 2017. FINDINGS: Cardiovascular: Normal heart size. No pericardial effusion. Atherosclerosis of thoracic aorta is noted. No dissection or aneurysm is noted. Mediastinum/Nodes: No enlarged mediastinal, hilar, or axillary lymph nodes. Thyroid gland, trachea, and esophagus demonstrate no significant findings. Lungs/Pleura: No pneumothorax or pleural effusion is noted. 3 mm nodule is noted in left upper lobe best seen on image number 54 of series 4. 7 mm cavitating abnormality is noted in the right middle lobe best seen on image number 104 series 4. Upper Abdomen: No acute abnormality. Musculoskeletal: No chest wall abnormality. No acute or significant osseous findings. IMPRESSION: 7 mm cavitary lesion is seen in the right middle lobe. It is uncertain if this represents cavitary metastatic lesion or small pulmonary abscess. Clinical correlation is recommended. Follow-up CT scan in 6-8 weeks is recommended to determine stability or resolution. Aortic Atherosclerosis (ICD10-I70.0). Electronically Signed   By: JMarijo Conception M.D.   On: 06/17/2017 12:04   Dg Chest Portable 1 View  Result Date: 06/16/2017 CLINICAL DATA:  Three weeks of weakness, anorexia, emaciation. History of hepatitis-B, HIV-AIDS. EXAM: PORTABLE CHEST 1 VIEW COMPARISON:  Chest x-ray of June 11, 2017 FINDINGS: The lungs are hyperinflated. There is no focal infiltrate. There are subtle nodules of varying sizes up to 3 mm in diameter in both lungs. Some likely reflect  pulmonary vessels on the end but others are more peripheral. The heart and pulmonary vascularity are normal. The mediastinum is normal in width. There is no pleural effusion. IMPRESSION: No classic alveolar pneumonia. No CHF. Tiny pulmonary parenchymal nodules are suspected bilaterally and may reflect infectious or neoplastic processes. Chest CT scanning is recommended. Electronically Signed   By: David  JMartiniqueM.D.   On: 06/16/2017 16:45     WKathrene Alu MD 06/29/2017, 7:16 AM PGY-1, C[place]leIntern pager: [phone] text pages welcome

## 2017-06-29 NOTE — Progress Notes (Signed)
Nutrition Follow-up  DOCUMENTATION CODES:   Underweight, Severe malnutrition in context of chronic illness  INTERVENTION:   -Initiate Jevity 1.2 @ 65 ml/hr via NGT  Tube feeding regimen provides 1872 kcal (100% of needs), 87 grams of protein, and 1259 ml of H2O.   NUTRITION DIAGNOSIS:   Malnutrition (Severe) related to chronic illness (HIV) as evidenced by severe depletion of body fat, severe depletion of muscle mass, energy intake < 75% for > or equal to 1 month.  Ongoing  GOAL:   Patient will meet greater than or equal to 90% of their needs  Progressing   MONITOR:   Diet advancement, Labs, Weight trends, TF tolerance, Skin, I & O's  REASON FOR ASSESSMENT:   Consult Enteral/tube feeding initiation and management  ASSESSMENT:   Pt admitted with bed bugs, cachexia and feeling weak and tired x past 4 weeks, presumed cryptoccoal meningitis. PMH is significant for AIDS, hep B, cocaine use, and HTN.    8/11- extubated 8/12- s/p BSE and MBSS, recommending continued NPO status   Case discussed with RN, who reports continued NPO and tolerating TF well at this time.   Spoke with pt, who reports feeling better, although complains of being hungry. Educated pt on need for TF and how he is receiving his nutrition currently.   Vital AF 1.2 is infusing at 50 ml/hr via NGT. Regimen provides 1440 kcal, 90 grams protein, and 973 ml free water, meeting 82% of estimated energy needs and 100% of estimated protein needs.   Received call from Internal Medicine Teaching Service; received verbal order to adjust TF.   Labs reviewed: Na: 132, Mg: 1.5.   Diet Order:  Diet NPO time specified  Skin:  Reviewed, no issues  Last BM:  06/28/17  Height:   Ht Readings from Last 1 Encounters:  06/19/17 5\' 6"  (1.676 m)    Weight:   Wt Readings from Last 1 Encounters:  06/29/17 111 lb 6.4 oz (50.5 kg)    Ideal Body Weight:  50 kg  BMI:  Body mass index is 17.98 kg/m.  Estimated  Nutritional Needs:   Kcal:  1750-1950  Protein:  85-100 grams  Fluid:  > 1.7 L  EDUCATION NEEDS:   Education needs addressed  Rupert Azzara A. Mayford KnifeWilliams, RD, LDN, CDE Pager: [phone] After hours Pager: [phone]

## 2017-06-29 NOTE — Progress Notes (Signed)
Pharmacy Antibiotic Note  Kenneth Rivas is a 56 y.o. male admitted on 06/16/2017 with AIDS and failure to thrive. Pt with acute cryptococcal bloodstream infection also with cavitary pulmonary nodule. Bactrim and azithromycin for PCP/MAC prophylaxis respectively due to CD4 count of 2.  Vancomycin and Zosyn were added for pneumonia during her stay, then narrowed to ceftazidime (stop 8/12) to cover the pseudomonas cultured in the BAL. Mr. [name] has cryptococcal meningitis requiring amphotericin B and flucytosine treatment for two weeks. Flucytosine was stopped secondary to leukopenia. After completion of the amphotericin b he will be transitioned to fluconazole for 8 weeks.  Plan: -Continue Amphotericin B until 8/16 -Monitor renal fx, cultures, Mg & K -Mg 2gm IV x 1 replacement ordered for today. -F/u LP and cultures planned for 8/13   Height: _0  (167.6 cm) Weight: 111 lb 6.4 oz (50.5 kg) IBW/kg (Calculated) : 63.8  Temp (24hrs), Avg:98.5 F (36.9 C), Min:98.4 F (36.9 C), Max:98.5 F (36.9 C)   Recent Labs Lab 06/25/17 0341 06/26/17 0208 06/27/17 0222 06/28/17 0119 06/29/17 0310  WBC 1.5* 1.5* 2.4* 3.1* 2.8*  CREATININE 0.54* 0.53* 0.53* 0.48* 0.50*     Antimicrobials this admission: 8/5 Ceftaz>> 8/12 8/2 AmphB>> [8/16->then fluc for 8wks] 8/2 Flucytosine>>08/08 d/c'ed per ID d/t leukopenia 8/3 Zosyn >>8/5 8/3 Vanc >>8/5 8/4 Bactrim >> 8/4 Azithromycin >>  Microbiology results: 8/8 CSF: cryptococcus 8/6 CSF: cryptococcus 8/3 BAL Fungus Culture: Candida Albicans>>already on ampho 8/3 Pneumocystis smear: negative 8/3 AFB: NEG 8/3 CSF - yeast (cryptococcus neoformans) 8/3 MRSA - NEG 8/3 BAL: yeast; pseudomonas (pansensitive) 8/2 blood cx: crypto neoformans 8/2 sputum: not acceptable for testing 8/2 Crypto: positive   Kabella Cassidy, Pharm.D., BCPS Clinical Pharmacist Pager: [phone] Clinical phone for 06/29/2017 from 8:30-4:00 is [phone]. After 4pm,  please call Main Rx [phone]) for assistance. 06/29/2017 1:44 PM

## 2017-06-30 ENCOUNTER — Inpatient Hospital Stay (HOSPITAL_COMMUNITY): Payer: Medicaid Other

## 2017-06-30 DIAGNOSIS — H538 Other visual disturbances: Secondary | ICD-10-CM

## 2017-06-30 LAB — CBC WITH DIFFERENTIAL/PLATELET
Basophils Absolute: 0 K/uL (ref 0.0–0.1)
Basophils Relative: 0 %
Eosinophils Absolute: 0.2 K/uL (ref 0.0–0.7)
Eosinophils Relative: 6 %
HCT: 27.1 % — ABNORMAL LOW (ref 39.0–52.0)
Hemoglobin: 8.8 g/dL — ABNORMAL LOW (ref 13.0–17.0)
Lymphocytes Relative: 21 %
Lymphs Abs: 0.5 K/uL — ABNORMAL LOW (ref 0.7–4.0)
MCH: 25.9 pg — ABNORMAL LOW (ref 26.0–34.0)
MCHC: 32.5 g/dL (ref 30.0–36.0)
MCV: 79.7 fL (ref 78.0–100.0)
Monocytes Absolute: 0.2 K/uL (ref 0.1–1.0)
Monocytes Relative: 7 %
Neutro Abs: 1.6 K/uL — ABNORMAL LOW (ref 1.7–7.7)
Neutrophils Relative %: 66 %
Platelets: 257 K/uL (ref 150–400)
RBC: 3.4 MIL/uL — ABNORMAL LOW (ref 4.22–5.81)
RDW: 14.5 % (ref 11.5–15.5)
WBC: 2.5 K/uL — ABNORMAL LOW (ref 4.0–10.5)

## 2017-06-30 LAB — GLUCOSE, CAPILLARY
GLUCOSE-CAPILLARY: 107 mg/dL — AB (ref 65–99)
Glucose-Capillary: 103 mg/dL — ABNORMAL HIGH (ref 65–99)
Glucose-Capillary: 113 mg/dL — ABNORMAL HIGH (ref 65–99)
Glucose-Capillary: 115 mg/dL — ABNORMAL HIGH (ref 65–99)
Glucose-Capillary: 84 mg/dL (ref 65–99)
Glucose-Capillary: 93 mg/dL (ref 65–99)

## 2017-06-30 LAB — BASIC METABOLIC PANEL WITH GFR
Anion gap: 8 (ref 5–15)
BUN: 10 mg/dL (ref 6–20)
CO2: 23 mmol/L (ref 22–32)
Calcium: 8.3 mg/dL — ABNORMAL LOW (ref 8.9–10.3)
Chloride: 98 mmol/L — ABNORMAL LOW (ref 101–111)
Creatinine, Ser: 0.51 mg/dL — ABNORMAL LOW (ref 0.61–1.24)
GFR calc Af Amer: >60 mL/min
GFR calc non Af Amer: >60 mL/min
Glucose, Bld: 115 mg/dL — ABNORMAL HIGH (ref 65–99)
Potassium: 3.8 mmol/L (ref 3.5–5.1)
Sodium: 129 mmol/L — ABNORMAL LOW (ref 135–145)

## 2017-06-30 LAB — PHOSPHORUS: Phosphorus: 4.2 mg/dL (ref 2.5–4.6)

## 2017-06-30 LAB — PATHOLOGIST SMEAR REVIEW

## 2017-06-30 LAB — MAGNESIUM: Magnesium: 1.8 mg/dL (ref 1.7–2.4)

## 2017-06-30 MED ORDER — AMLODIPINE BESYLATE 10 MG PO TABS
10.0000 mg | ORAL_TABLET | Freq: Every day | ORAL | Status: DC
  Administered 2017-07-01 – 2017-07-02 (×2): 10 mg
  Filled 2017-06-30 (×2): qty 1

## 2017-06-30 MED ORDER — MAGNESIUM SULFATE IN D5W 1-5 GM/100ML-% IV SOLN
1.0000 g | Freq: Once | INTRAVENOUS | Status: AC
  Administered 2017-06-30: 1 g via INTRAVENOUS
  Filled 2017-06-30: qty 100

## 2017-06-30 MED ORDER — LIDOCAINE HCL (CARDIAC) 20 MG/ML IV SOLN
INTRAVENOUS | Status: AC
  Filled 2017-06-30: qty 5

## 2017-06-30 NOTE — Progress Notes (Signed)
Physical Therapy Treatment Patient Details Name: Kenneth Rivas MRN: [id number] DOB: 1961/05/27 Today's Date: 06/30/2017    History of Present Illness 56 y.o. 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 Admitted on 7/31 w/ cc: weakness and FTT. Admitting dx: FTT and cachexia d/t AIDs, pulmonary nodules, bedbugs, and HTN. Had CT chest showing cavitary lung lesion in the RML. 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 Intubated 8/3-8/11.    PT Comments    Pt progressing well with mobility, he ambulated 100' with RW, requiring min to mod A for balance.   Follow Up Recommendations  SNF;Supervision/Assistance - 24 hour     Equipment Recommendations  Other (comment) (TBA)    Recommendations for Other Services       Precautions / Restrictions Precautions Precautions: Fall Restrictions Weight Bearing Restrictions: No    Mobility  Bed Mobility Overal bed mobility: Needs Assistance Bed Mobility: Supine to Sit     Supine to sit: Min assist     General bed mobility comments: pt moved legs off bed once therapist initiated movement.  assisted trunk up and forward.  Transfers Overall transfer level: Needs assistance   Transfers: Sit to/from Stand Sit to Stand: Min assist         General transfer comment: cues for hand placement, stability assist, more assist forward than boosting up.  Ambulation/Gait Ambulation/Gait assistance: Mod assist Ambulation Distance (Feet): 100 Feet Assistive device: Rolling walker (2 wheeled) Gait Pattern/deviations: Step-through pattern Gait velocity: slower Gait velocity interpretation: Below normal speed for age/gender General Gait Details: mild to moderately unsteady at times with instability at knees, but with improvement during time up. Pt needed postural assist and help controlling  RW.   Stairs            Wheelchair Mobility    Modified Rankin (Stroke Patients Only)       Balance Overall balance assessment: Needs assistance Sitting-balance support: No upper extremity supported;Feet supported Sitting balance-Leahy Scale: Fair Sitting balance - Comments: Sitting at EOB no back support statically. Assist for dynamic.    Standing balance support: Bilateral upper extremity supported Standing balance-Leahy Scale: Poor Standing balance comment: Reliant on BUE support                            Cognition Arousal/Alertness: Awake/alert Behavior During Therapy: Flat affect Overall Cognitive Status: No family/caregiver present to determine baseline cognitive functioning                                 General Comments: Slow to respond at times.      Exercises      General Comments        Pertinent Vitals/Pain Pain Assessment: No/denies pain    Home Living                      Prior Function            PT Goals (current goals can now be found in the care plan section) Acute Rehab PT Goals Patient Stated Goal: None stated PT Goal Formulation: With patient Time For Goal Achievement: 07/08/17 Potential to Achieve Goals: Fair Progress towards PT goals: Progressing toward goals    Frequency    Min 3X/week      PT Plan Current  plan remains appropriate    Co-evaluation              AM-PAC PT "6 Clicks" Daily Activity  Outcome Measure  Difficulty turning over in bed (including adjusting bedclothes, sheets and blankets)?: Total Difficulty moving from lying on back to sitting on the side of the bed? : Total Difficulty sitting down on and standing up from a chair with arms (e.g., wheelchair, bedside commode, etc,.)?: Total Help needed moving to and from a bed to chair (including a wheelchair)?: A Lot Help needed walking in hospital room?: A Lot Help needed climbing 3-5 steps with a railing? : A  Lot 6 Click Score: 9    End of Session Equipment Utilized During Treatment: Gait belt Activity Tolerance: Patient tolerated treatment well Patient left: in bed;with call bell/phone within reach;with bed alarm set Nurse Communication: Mobility status PT Visit Diagnosis: Difficulty in walking, not elsewhere classified (R26.2);Muscle weakness (generalized) (M62.81);Other abnormalities of gait and mobility (R26.89)     Time: 4098-1191 PT Time Calculation (min) (ACUTE ONLY): 20 min  Charges:  $Gait Training: 8-22 mins                    G Codes:          Kenneth Rivas 06/30/2017, 11:08 AM [phone]

## 2017-06-30 NOTE — Consult Note (Signed)
Chief Complaint  Patient presents with  . Weakness  . Dehydration  : CC: Blurry Vision      Ophthalmology HPI: This is a 56 y.o.  male with no past ocular history that presents with a droopy lid OS and blurry vision OS. He states that 2 weeks ago he feels like he noticed some changes in his vision and a droopy lid. Symptoms have not worsened or changed.  He denies eye pain, flashes of light, floaters, double vision (no double vision with both eyes open), or curtains coming over vision.    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 He has had eye ptosis     Past Ocular History: None    Last Eye Exam:  >10 years ago.        Past Medical History:  Diagnosis Date  . AIDS (HCC)   . Anemia   . Anxiety   . Cellulitis of right hand 06/23/2017  . Hepatitis B   . HIV disease (HCC)   . Hypertension   . Immune deficiency disorder [facility])      Past Surgical History:  Procedure Laterality Date  . HERNIA REPAIR       Social History   Social History  . Marital status: Married    Spouse name: N/A  . Number of children: N/A  . Years of education: N/A   Occupational History  . Not on file.   Social History Main Topics  . Smoking status: Current Some Day Smoker    Packs/day: 0.30    Types: Cigarettes  . Smokeless tobacco: Never Used     Comment: pt. given quit line information  . Alcohol use 2.0 oz/week    4 Standard drinks or equivalent per week     Comment: beer  . Drug use: Yes    Types: Cocaine     Comment: crack- last use 03/2012. no h/o IVDU, other illicits  . Sexual activity: Yes    Partners: Male     Comment: patient declined condoms   Other Topics Concern  . Not on file   Social History Narrative  . No narrative on file     No Active Allergies   No current facility-administered medications on file prior to encounter.    Current Outpatient Prescriptions on File Prior to Encounter  Medication Sig Dispense  Refill  . amLODipine (NORVASC) 5 MG tablet Take 1 tablet (5 mg total) by mouth daily. (Patient not taking: Reported on 06/11/2017) 30 tablet 11  . dolutegravir (TIVICAY) 50 MG tablet Take 1 tablet (50 mg total) by mouth daily. (Patient not taking: Reported on 06/11/2017) 30 tablet 3  . emtricitabine-tenofovir (TRUVADA) 200-300 MG per tablet Take 1 tablet by mouth daily. (Patient not taking: Reported on 06/11/2017) 30 tablet 3  . HYDROcodone-acetaminophen (NORCO/VICODIN) 5-325 MG per tablet Take 1-2 tablets by mouth every 4 (four) hours as needed for pain. (Patient not taking: Reported on 06/11/2017) 15 tablet 0  . megestrol (MEGACE ES) 625 MG/5ML suspension Take 5 mLs (625 mg total) by mouth daily. (Patient not taking: Reported on 06/11/2017) 150 mL 4  . naproxen (NAPROSYN) 375 MG tablet Take 1 tablet (375 mg total) by mouth 2 (two) times daily. (Patient not taking: Reported on 06/11/2017) 20 tablet 0  . ondansetron (ZOFRAN) 4 MG tablet Take 1 tablet (4 mg total) by mouth every 8 (eight) hours as needed for nausea. (Patient not taking: Reported on 06/11/2017) 60 tablet 0  . potassium chloride SA (K-DUR,KLOR-CON)  20 MEQ tablet Take 1 tablet (20 mEq total) by mouth 2 (two) times daily. (Patient not taking: Reported on 06/17/2017) 10 tablet 0  . triamcinolone ointment (KENALOG) 0.5 % Apply topically 2 (two) times daily. (Patient not taking: Reported on 06/11/2017) 30 g 2     Review of Systems  Constitutional: Positive for malaise/fatigue and weight loss.  HENT: Positive for sore throat.   Eyes: Positive for blurred vision. Negative for double vision, photophobia, pain, discharge and redness.  Respiratory: Negative for cough.   Cardiovascular: Negative for chest pain.  Gastrointestinal: Negative for abdominal pain.  Genitourinary: Negative for dysuria.  Musculoskeletal: Positive for neck pain.  Neurological: Positive for dizziness, weakness and headaches.  Endo/Heme/Allergies: Does not bruise/bleed easily.   Psychiatric/Behavioral: Positive for depression. Negative for suicidal ideas.      Exam:  General: Awake, Alert, Oriented, Cachectic.   Vision (near): without correction   OD: 8 point (20/70)  OS:26 point (20/(20)  Confrontational Field:   Full to count fingers, both eyes  Extraocular Motility:  Dull ductions OD.  Complete 3rd nerve OS, partial 6th and partial 4th.    External:   Normal Symmetry, , infraorbital nerve appears intact.  ?Decreased sensation left face, complete ptosis OS.   Lagophthalmos <51mm OS.   Decreased 7th nerve function Left face, 4/5 strength Left side   Hertel:   16/16 (101)  Pupils  OD: 3mm to 2.73mm OD reactive without Afferent pupillary defect   OS: 5mm nonreactive without APD by reverse.   IOP(tonopen)  OD: 15  OS: 15  Slit Lamp Exam:  Lids/Lashes  OD: Normal Lids and lashes, No lesion or injury  OS: Normal lids and lashes, nor lesion or injury  Conjucntiva/Sclera  OD: White and quiet  OS: White and quiet  Cornea  OD: Clear without abrasion or defect  OS: Clear without abrasion or defect  Anterior Chamber  OD: Deep and quiet  OS: Deep and quiet  Iris  OD: Normal iris architecture  OS: Normal Iris Architecture   Lens  OD: 1+ NSC   OS: 1+NSC   Anterior Vitreous  OD: Clear, without cell  OS: Clear without cell   POSTERIOR POLE EXAM (Dialated with phenylephrine and tropicamide.Dilation may last up to 24 hours)  View:   OD: 20/20 view without opacities  OS: 20/20 view without opacities  Vitreous:   OD: Clear, no cell  OS: Clear, no cell  Disc:   OD: flat, sharp margin, with appropriate color  OS: flat, sharp margin, with appropriate color No Disc edema seen in either eye. +Spontaneous venous pulsations  C:D Ratio:   OD: 0.2   OS: 0.2  Macula  OD: Flat, with appropriate light reflex  OS: Flat with appropriate light reflex  Vessels  OD: Normal vasculature  OS: Normal vasculature  Periphery  OD: Flat 360  degrees without tear, hole or detachment  OS: Flat 360 degrees without tear, hole or detachment     Assessment and Plan:   This is 56 y.o.  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    There is no evidence of optic neuropathy, retinitis, or intraocular infections.     Blurry vision is likely due to mild exposure keratitis.   Cryptococcal Meningitis - Continue management per primary team.   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   3rd,4th,6th, Cranial Nerve Palsy:  His 3rd, and mild 4th and 6th nerve palsy will  cause double vision. Currently, his ptosis is preventing diplopia. He can patch one eye if he becomes symptomatic. This will likley resolve as inflammation resolves.     Will continue to follow.   Mack HookAndrew Cabot Cromartie, M.D.  [facility] [address] [place], [place] [address] [phone](o) [phone] ([phone]) [phone]

## 2017-06-30 NOTE — Progress Notes (Signed)
Family Medicine Teaching Service Daily Progress Note Intern Pager: [phone]  Patient name: Kenneth Rivas Medical record number: [id number] Date of birth: 1961-02-22 Age: 56 y.o. Gender: male  Primary Care Provider: Tommy Rivas, Kenneth Islam, MD Consultants: ID Code Status: FULL  Pt Overview and Major Events to Date:  Admitted on 7/31, CD4 count 2 On 8/1, found to have cryptococcal antigen in serum On 8/3, Transferred to ICU for LP due to risk of intubation/vent, was intubated after transfer On 8/11, Extubated On 8/13, Transferred to [place] and Plan:  Kenneth Rivas a 56 y.o.malepresenting with cachexia and feeling weak and tired x past 4 weeks. PMH is significant for AIDS, hep B, cocaine use, and HTN.   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  He is on day 12 of amphotericin, flucytosine was abondoned due toxicity.  - Pharmacy recommends amphotericin until 8/16, then transition to high-dose fluconazole for 8 weeks - ID recommends repeat LP with documented opening pressure and removal of roughly 30 mL of CSF, documentation of closing pressure.  Consulted IR for LP, but they will not perform LP unless it is tried at the bedside first.  Will need to figure out how best to get this LP done today.  Also need to send CSF fluid for culture to decide when to discontinue amphotericin - experiencing left eye ptosis and blurry vision out of left eye; will consult ophthalmology d/t concern for CMV retinitis per ID recs  Pneumonia: Pseudomonas found on sputum culture.  Currently on ceftazidime 1g infusion TID day 8.  Also on  azithromycin 1.2 g weekly and Bactrim DS 800-160 mg daily for prophylaxis due to AIDS with CD4 of 2.   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 - NPO due to dysphagia and failed swallow study, receiving nutrition through NG tube, dietitian to adjust - continue to monitor Mag, K, and Phos, replete as needed - does not qualify for SNF placement due to lack of insurance.  Will need arrangement for a different home situation due to his chronic illness and unsafe current situation.  HTN. Hypertensive throughout hospital stay, initially attributed to recent cocaine use. At home on Norvasc 5 mg daily however patient reports as not taking.  Most current BP 149/90 on 8/14, which is similar to recent blood pressures. -Continue Norvasc 10 mg -Continue Hydral 5 mg with parameters: SBP>170 or DBP>110  -Monitor blood pressures  Hepatitis B. Not currently taking medication and does not follow with Infectious Disease. LFTs with AST 19, ALT 13 and alk phos 58. Normal total bili 0.8 and direct bili 0.2.   -Continue to monitor   H/o drug use and insecure home environment.In ED, UDS +for cocaine and amphetamines. May have contributed to his elevated BP on admission depending on time of ingestion.  -CSW consulted and has been in contact with patient's sister, Kenneth Rivas.  Will be able to make more progress on social needs now that patient has been extubated   FEN/GI: NPO due to failed barium swallow study Prophylaxis: Lovenox   Disposition:Continue on med-surg   Subjective:  Kenneth Rivas says that he feels fine this morning but would like to eat.  I reassured him that the NG tube was temporary and that he was getting the  nutrition he needed through his NG tube.  Objective: Temp:  [98 F (36.7 C)-98.4 F (36.9 C)] 98 F (36.7 C) (08/14 0512) Pulse Rate:  [71-78] 75 (08/14 0512) Resp:  [16-20] 20 (08/14 0512) BP: (138-149)/(76-90) 149/90 (08/14 0512) SpO2:  [98 %-100 %] 98 % (08/14 0512) Weight:  [118 lb 4.8 oz (53.7 kg)] 118 lb 4.8 oz (53.7 kg)  (08/14 8850) Physical Exam: General: ill appearing and frail, sleepy during exam Cardiovascular: RRR, no MRG Respiratory: no visible increase resp effort, no coughing during exam Abdomen: soft, nontender to palpation Extremities: Frail, nails cracked w/ nail fungus with dirt underneath, visible bony joints  Laboratory:  Recent Labs Lab 06/28/17 0119 06/29/17 0310 06/30/17 0344  WBC 3.1* 2.8* 2.5*  HGB 8.9* 8.7* 8.8*  HCT 26.8* 26.5* 27.1*  PLT 200 244 257    Recent Labs Lab 06/27/17 1330 06/28/17 0119 06/29/17 0310 06/30/17 0344  NA  --  131* 133* 129*  K  --  3.8 4.0 3.8  CL  --  101 100* 98*  CO2  --  _0 BUN  --  _1 CREATININE  --  0.48* 0.50* 0.51*  CALCIUM  --  8.2* 8.6* 8.3*  PROT 7.4  --   --   --   BILITOT 0.2*  --   --   --   ALKPHOS 77  --   --   --   ALT 47  --   --   --   AST 53*  --   --   --   GLUCOSE  --  99 102* 115*    Phos 4.2, Mag 1.8  Imaging/Diagnostic Tests: Ct Chest W Contrast  Result Date: 06/17/2017 CLINICAL DATA:  Shortness of breath. EXAM: CT CHEST WITH CONTRAST TECHNIQUE: Multidetector CT imaging of the chest was performed during intravenous contrast administration. CONTRAST:  33m ISOVUE-300 IOPAMIDOL (ISOVUE-300) INJECTION 61% COMPARISON:  Radiographs of June 16, 2017. FINDINGS: Cardiovascular: Normal heart size. No pericardial effusion. Atherosclerosis of thoracic aorta is noted. No dissection or aneurysm is noted. Mediastinum/Nodes: No enlarged mediastinal, hilar, or axillary lymph nodes. Thyroid gland, trachea, and esophagus demonstrate no significant findings. Lungs/Pleura: No pneumothorax or pleural effusion is noted. 3 mm nodule is noted in left upper lobe best seen on image number 54 of series 4. 7 mm cavitating abnormality is noted in the right middle lobe best seen on image number 104 series 4. Upper Abdomen: No acute abnormality. Musculoskeletal: No chest wall abnormality. No acute or significant osseous findings.  IMPRESSION: 7 mm cavitary lesion is seen in the right middle lobe. It is uncertain if this represents cavitary metastatic lesion or small pulmonary abscess. Clinical correlation is recommended. Follow-up CT scan in 6-8 weeks is recommended to determine stability or resolution. Aortic Atherosclerosis (ICD10-I70.0). Electronically Signed   By: JMarijo Conception M.D.   On: 06/17/2017 12:04   Dg Chest Portable 1 View  Result Date: 06/16/2017 CLINICAL DATA:  Three weeks of weakness, anorexia, emaciation. History of hepatitis-B, HIV-AIDS. EXAM: PORTABLE CHEST 1 VIEW COMPARISON:  Chest x-ray of June 11, 2017 FINDINGS: The lungs are hyperinflated. There is no focal infiltrate. There are subtle nodules of varying sizes up to 3 mm in diameter in both lungs. Some likely reflect pulmonary vessels on the end but others are more peripheral. The heart and pulmonary vascularity are normal. The mediastinum is normal in width. There is no pleural effusion. IMPRESSION: No classic alveolar pneumonia. No CHF. Tiny pulmonary  parenchymal nodules are suspected bilaterally and may reflect infectious or neoplastic processes. Chest CT scanning is recommended. Electronically Signed   By: David  Martinique M.D.   On: 06/16/2017 16:45     Kathrene Alu, MD 06/30/2017, 8:08 AM PGY-1, [place] Intern pager: [phone], text pages welcome

## 2017-06-30 NOTE — Progress Notes (Signed)
CSW received consult regarding SNF placement. Per medical director, patient will have to discharge home due to lack of insurance. Please have patient work with PT and RNs daily with mobility.   CSW signing off.  Osborne Cascoadia Krystiana Fornes LCSWA [phone]

## 2017-06-30 NOTE — Progress Notes (Signed)
[facility]  Date of Admission:  06/16/2017   Total days of antibiotics 13        Day 13 Amphotericin, stop 8/15       Ceftazidime 8/5-8/12        Flucytosine 8/4-8//         ASSESSMENT and PLAN:  Disseminated cryptococcal disease- Meningitis and Pneumonia:  Left eye Ptosis Patient continues to have left eye ptosis which is likely 2/2 cryptococcal meningitis causing a cranial nerve defect. -Consult opthamology for cmv retinitis  Patient needs to have repeat LP today 8/14 and get 30mL removed. OP and closing pressure to be collected.  Previous opening pressures to date 38 (8/4)>14 (8/4)> 15 (8/6)> 9.5 (8/8)  -Stop amphotericin 8/15 and start fluconazole for 8 week therapy. Renal function is still good cr=0.5  -Was seen by SLP yesterday 8/13 and recommended to continue with NPO since patient continues to have reduced pharyngeal constriction and decreased hyolaryngeal excursion. Patient was given EMT.   HIV/AIDS -Will not start antiretroviral therapy due to risk of IRIS. Will consider in 5 weeks. -Continue azithromycin and bactrim as patient is on ART    . amLODipine  5 mg Per Tube Daily  . azithromycin  1,200 mg Per Tube Weekly  . chlorhexidine  15 mL Mouth Rinse BID  . dextrose  10 mL Intravenous Q24H  . dextrose  10 mL Intravenous Q24H  . heparin subcutaneous  5,000 Units Subcutaneous Q8H  . lidocaine (PF)  5 mL Other Once  . mouth rinse  15 mL Mouth Rinse q12n4p  . pantoprazole sodium  40 mg Per Tube Daily  . sennosides  5 mL Per Tube QHS  . sulfamethoxazole-trimethoprim  1 tablet Per Tube Daily    SUBJECTIVE: [name] was seen laying in his bed this morning. He said that he was doing well and did not have any headaches. He still has left eye ptosis.   Review of Systems: ROS as above   No Active Allergies  OBJECTIVE: Vitals:   06/29/17 0500 06/29/17 1418 06/29/17 2107 06/30/17 0512  BP: (!) 146/73 (!) 145/76 138/77 (!)  149/90  Pulse: 77 71 78 75  Resp: 17 16 18 20   Temp: 98.5 F (36.9 C) 98.3 F (36.8 C) 98.4 F (36.9 C) 98 F (36.7 C)  TempSrc: Oral Oral Oral Oral  SpO2: 100% 100% 100% 98%  Weight:    118 lb 4.8 oz (53.7 kg)  Height:       Body mass index is 19.09 kg/m.  Physical Exam  Constitutional: No distress.  HENT:  Head: Normocephalic and atraumatic.  Eyes:  Left eye ptosis  Cardiovascular: Normal rate, regular rhythm, normal heart sounds and intact distal pulses.   Pulmonary/Chest: Effort normal and breath sounds normal. No respiratory distress. He has no wheezes.  Auscultation in supine position  Abdominal: Soft. Bowel sounds are normal. There is no tenderness.  Psychiatric: Mood, memory, affect and judgment normal.    Lab Results Lab Results  Component Value Date   WBC 2.5 (L) 06/30/2017   HGB 8.8 (L) 06/30/2017   HCT 27.1 (L) 06/30/2017   MCV 79.7 06/30/2017   PLT 257 06/30/2017    Lab Results  Component Value Date   CREATININE 0.51 (L) 06/30/2017   BUN 10 06/30/2017   NA 129 (L) 06/30/2017   K 3.8 06/30/2017   CL 98 (L) 06/30/2017   CO2 23 06/30/2017    Lab  Results  Component Value Date   ALT 47 06/27/2017   AST 53 (H) 06/27/2017   ALKPHOS 77 06/27/2017   BILITOT 0.2 (L) 06/27/2017     Microbiology: Recent Results (from the past 240 hour(s))  Acid Fast Smear (AFB)     Status: None   Collection Time: 06/21/17 12:12 PM  Result Value Ref Range Status   AFB Specimen Processing Concentration  Final   Acid Fast Smear Negative  Final    Comment: (NOTE) Performed At: [facility] [address] [place], [place] [id number] Mila Homer MD WU:[id number]    Source (AFB) TRACHEAL ASPIRATE  Final  CSF culture     Status: Abnormal   Collection Time: 06/22/17  4:25 PM  Result Value Ref Range Status   Specimen Description CSF  Final   Special Requests NONE  Final   Gram Stain   Final    CYTOSPIN SMEAR WBC PRESENT,BOTH PMN AND  MONONUCLEAR ENCAPSULATED YEAST SEEN CRITICAL RESULT CALLED TO, READ BACK BY AND VERIFIED WITH: Teodoro Spray RN 17:10 06/22/17 ([email or web address])    Culture (A)  Final    CRYPTOCOCCUS NEOFORMANS CRITICAL VALUE NOTED.  VALUE IS CONSISTENT WITH PREVIOUSLY REPORTED AND CALLED VALUE.    Report Status 06/26/2017 FINAL  Final  Fungus Culture With Stain     Status: None   Collection Time: 06/22/17  4:25 PM  Result Value Ref Range Status   Fungus Stain Final report  Final   Fungus (Mycology) Culture Preliminary report  Final    Comment: (NOTE) Performed At: [facility] [address] [place], [place] [id number] Mila Homer MD VH:[id number]    Fungal Source CSF  Final  Fungus Culture Result     Status: None   Collection Time: 06/22/17  4:25 PM  Result Value Ref Range Status   Result 1 Comment  Final    Comment: (NOTE) KOH/Calcofluor preparation:  no fungus observed. Performed At: [facility] [address] [place], [place] [id number] Mila Homer MD UV:[id number]   Fungal organism reflex     Status: None   Collection Time: 06/22/17  4:25 PM  Result Value Ref Range Status   Fungal result 1 Comment  Final    Comment: (NOTE) Yeast isolated, identification in progress. REPORTED TO LISA T. ON 06/29/17 AT 1516 EST, JP FAXED TO [phone] Performed At: [facility] [address] [place], [place] [id number] Mila Homer MD OA:[id number]   Culture, fungus without smear     Status: Abnormal (Preliminary result)   Collection Time: 06/24/17  5:35 PM  Result Value Ref Range Status   Specimen Description CSF  Final   Special Requests NONE  Final   Culture CRYPTOCOCCUS NEOFORMANS (A)  Final   Report Status PENDING  Incomplete    Lorenso Courier, MD [facility] [phone] pager   [phone] cell 06/30/2017, 8:30 AM

## 2017-06-30 NOTE — Progress Notes (Signed)
Notified MD regarding NG tube placement. Orders to advance tube 2-3 inches and restart feeding.  Monitor for abdominal discomfort or nausea.

## 2017-06-30 NOTE — Progress Notes (Signed)
A lumbar puncture was attempted on [name] at bedside at around 2030 on 06/30/17. The site was marked, cleaned with iodine, draped, and injected with 1 mL 1% Lidocaine.  The spinal needle was inserted three times with no fluid return. The patient had very minimal bleeding after withdrawing the needle.  A bandaid was placed on the LP site.  We will consult IR for LP tomorrow morning.

## 2017-07-01 ENCOUNTER — Inpatient Hospital Stay (HOSPITAL_COMMUNITY): Payer: Medicaid Other

## 2017-07-01 LAB — GLUCOSE, CAPILLARY
GLUCOSE-CAPILLARY: 122 mg/dL — AB (ref 65–99)
GLUCOSE-CAPILLARY: 108 mg/dL — AB (ref 65–99)
Glucose-Capillary: 113 mg/dL — ABNORMAL HIGH (ref 65–99)
Glucose-Capillary: 109 mg/dL — ABNORMAL HIGH (ref 65–99)
Glucose-Capillary: 90 mg/dL (ref 65–99)
Glucose-Capillary: 101 mg/dL — ABNORMAL HIGH (ref 65–99)

## 2017-07-01 LAB — BASIC METABOLIC PANEL
ANION GAP: 6 (ref 5–15)
BUN: 10 mg/dL (ref 6–20)
CHLORIDE: 99 mmol/L — AB (ref 101–111)
CO2: 25 mmol/L (ref 22–32)
Calcium: 8.4 mg/dL — ABNORMAL LOW (ref 8.9–10.3)
Creatinine, Ser: 0.54 mg/dL — ABNORMAL LOW (ref 0.61–1.24)
GFR calc non Af Amer: >60 mL/min (ref >60)
Glucose, Bld: 110 mg/dL — ABNORMAL HIGH (ref 65–99)
POTASSIUM: 3.7 mmol/L (ref 3.5–5.1)
Sodium: 130 mmol/L — ABNORMAL LOW (ref 135–145)

## 2017-07-01 LAB — CBC WITH DIFFERENTIAL/PLATELET
BASOS ABS: 0 10*3/uL (ref 0.0–0.1)
BASOS PCT: 1 %
Eosinophils Absolute: 0.1 10*3/uL (ref 0.0–0.7)
Eosinophils Relative: 5 %
HEMATOCRIT: 26.4 % — AB (ref 39.0–52.0)
HEMOGLOBIN: 8.6 g/dL — AB (ref 13.0–17.0)
LYMPHS PCT: 18 %
Lymphs Abs: 0.4 10*3/uL — ABNORMAL LOW (ref 0.7–4.0)
MCH: 26.1 pg (ref 26.0–34.0)
MCHC: 32.6 g/dL (ref 30.0–36.0)
MCV: 80 fL (ref 78.0–100.0)
Monocytes Absolute: 0.1 10*3/uL (ref 0.1–1.0)
Monocytes Relative: 7 %
NEUTROS ABS: 1.5 10*3/uL — AB (ref 1.7–7.7)
Neutrophils Relative %: 69 %
Platelets: 255 10*3/uL (ref 150–400)
RBC: 3.3 MIL/uL — AB (ref 4.22–5.81)
RDW: 14.4 % (ref 11.5–15.5)
WBC: 2.2 10*3/uL — ABNORMAL LOW (ref 4.0–10.5)

## 2017-07-01 LAB — MAGNESIUM: Magnesium: 1.5 mg/dL — ABNORMAL LOW (ref 1.7–2.4)

## 2017-07-01 LAB — PHOSPHORUS: Phosphorus: 4 mg/dL (ref 2.5–4.6)

## 2017-07-01 MED ORDER — MAGNESIUM SULFATE 2 GM/50ML IV SOLN
2.0000 g | Freq: Once | INTRAVENOUS | Status: AC
  Administered 2017-07-01: 2 g via INTRAVENOUS
  Filled 2017-07-01: qty 50

## 2017-07-01 MED ORDER — LIDOCAINE HCL (PF) 1 % IJ SOLN
5.0000 mL | Freq: Once | INTRAMUSCULAR | Status: AC
  Filled 2017-07-01: qty 5

## 2017-07-01 MED ORDER — POLYVINYL ALCOHOL 1.4 % OP SOLN
1.0000 [drp] | OPHTHALMIC | Status: DC | PRN
  Filled 2017-07-01: qty 15

## 2017-07-01 MED ORDER — POTASSIUM CHLORIDE 20 MEQ/15ML (10%) PO SOLN
20.0000 meq | Freq: Once | ORAL | Status: AC
  Administered 2017-07-01: 20 meq
  Filled 2017-07-01: qty 15

## 2017-07-01 MED ORDER — LIDOCAINE HCL (PF) 1 % IJ SOLN
INTRAMUSCULAR | Status: AC
  Filled 2017-07-01: qty 5

## 2017-07-01 NOTE — Progress Notes (Signed)
Family Medicine Teaching Service Daily Progress Note Intern Pager: [phone]  Patient name: Kenneth Rivas Medical record number: [id number] Date of birth: 1961-07-17 Age: 56 y.o. Gender: male  Primary Care Provider: Tommy Medal, Lavell Islam, MD Consultants: ID Code Status: FULL  Pt Overview and Major Events to Date:  Admitted on 7/31, CD4 count 2 On 8/1, found to have cryptococcal antigen in serum On 8/3, Transferred to ICU for LP due to risk of intubation/vent, was intubated after transfer On 8/11, Extubated On 8/13, Transferred to [facility] and Plan:  Kenneth Rivas a 56 y.o.malepresenting with cachexia and feeling weak and tired x past 4 weeks. PMH is significant for AIDS, hep B, cocaine use, and HTN.   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  He is on day 12 of amphotericin, flucytosine was abondoned due toxicity.  - ID will determine length of amphotericin B treatment depending on results of CSF fluid culture - ID recommends repeat LP with documented opening pressure and removal of roughly 30 mL of CSF, documentation of closing pressure.  Attempted at bedside by our team with no CSF return, so IR was consulted on 8/15 to do the LP - experiencing left eye ptosis and blurry vision out of left eye; ophthalmologist saw him yesterday and was not concerned about CMV retinitis and thinks vision change is due to exposure keratitis and prescribed tear lubricant QHS.  He will continue to follow.  Pneumonia: Pseudomonas found on sputum culture.  Currently on ceftazidime 1g infusion TID day 8.  Also on  azithromycin 1.2 g weekly and Bactrim DS 800-160 mg daily for prophylaxis due to AIDS with CD4 of 2.   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 - NPO due to dysphagia and failed swallow study, receiving nutrition through NG tube, which came out on 8/14 and was reinserted, dietitian to adjust - continue to monitor Mag, K, and Phos, replete as needed - does not qualify for SNF placement due to lack of insurance.  Will need arrangement for a different home situation due to his chronic illness and unsafe current situation. - low Mag of 1.5 on 8/15, was repleted by pharmacy  HTN. Hypertensive throughout hospital stay, initially attributed to recent cocaine use. At home on Norvasc 5 mg daily however patient reports as not taking.  Most current BP 140/70 on 8/14, which is similar to recent blood pressures. -Continue Norvasc 10 mg -Continue Hydral 5 mg with parameters: SBP>170 or DBP>110  -Monitor blood pressures  Hepatitis B. Not currently taking medication and does not follow with Infectious Disease. LFTs with AST 19, ALT 13 and alk phos 58. Normal total bili 0.8 and direct bili 0.2.   -Continue to monitor   H/o drug use and insecure home environment.In ED, UDS +for cocaine and amphetamines. May have contributed to his elevated BP on admission depending on time of ingestion.  -CSW consulted and has been in contact with patient's sister, [name].  Will be able to make more progress on social needs now that patient has been extubated   FEN/GI: NPO due to failed barium swallow study Prophylaxis: Lovenox   Disposition:Continue on med-surg   Subjective:  Kenneth Rivas says that he feels fine this morning and has no complaints.  He understands  that he will be getting an LP today and is amenable to this.  Objective: Temp:  [98.2 F (36.8 C)-99 F (37.2 C)] 98.3 F (36.8 C) (08/15 0600) Pulse Rate:  [72-86] 72 (08/15 0600) Resp:  [14-16] 16 (08/14 2241) BP: (135-144)/(70-80) 140/70 (08/15 0600) SpO2:  [98 %-100 %] 100 % (08/15 0600) Physical Exam: General:  ill appearing and frail, sleeping at onset of exam but easily awoken Cardiovascular: RRR, no MRG Respiratory: no visible increase resp effort, no coughing during exam Abdomen: soft, nontender to palpation Extremities: Frail, nails cracked w/ nail fungus with dirt underneath, visible bony joints  Laboratory:  Recent Labs Lab 06/29/17 0310 06/30/17 0344 07/01/17 0615  WBC 2.8* 2.5* 2.2*  HGB 8.7* 8.8* 8.6*  HCT 26.5* 27.1* 26.4*  PLT 244 257 255    Recent Labs Lab 06/27/17 1330  06/29/17 0310 06/30/17 0344 07/01/17 0615  NA  --   < > 133* 129* 130*  K  --   < > 4.0 3.8 3.7  CL  --   < > 100* 98* 99*  CO2  --   < > '26 23 25  '$ BUN  --   < > '7 10 10  '$ CREATININE  --   < > 0.50* 0.51* 0.54*  CALCIUM  --   < > 8.6* 8.3* 8.4*  PROT 7.4  --   --   --   --   BILITOT 0.2*  --   --   --   --   ALKPHOS 77  --   --   --   --   ALT 47  --   --   --   --   AST 53*  --   --   --   --   GLUCOSE  --   < > 102* 115* 110*  < > = values in this interval not displayed.  Phos 4.0, Mag 1.5  Imaging/Diagnostic Tests: Ct Chest W Contrast  Result Date: 06/17/2017 CLINICAL DATA:  Shortness of breath. EXAM: CT CHEST WITH CONTRAST TECHNIQUE: Multidetector CT imaging of the chest was performed during intravenous contrast administration. CONTRAST:  90m ISOVUE-300 IOPAMIDOL (ISOVUE-300) INJECTION 61% COMPARISON:  Radiographs of June 16, 2017. FINDINGS: Cardiovascular: Normal heart size. No pericardial effusion. Atherosclerosis of thoracic aorta is noted. No dissection or aneurysm is noted. Mediastinum/Nodes: No enlarged mediastinal, hilar, or axillary lymph nodes. Thyroid gland, trachea, and esophagus demonstrate no significant findings. Lungs/Pleura: No pneumothorax or pleural effusion is noted. 3 mm nodule is noted in left upper lobe best seen on image number 54 of series 4. 7 mm cavitating abnormality is noted in the right middle lobe best seen on image number 104 series 4. Upper Abdomen: No acute  abnormality. Musculoskeletal: No chest wall abnormality. No acute or significant osseous findings. IMPRESSION: 7 mm cavitary lesion is seen in the right middle lobe. It is uncertain if this represents cavitary metastatic lesion or small pulmonary abscess. Clinical correlation is recommended. Follow-up CT scan in 6-8 weeks is recommended to determine stability or resolution. Aortic Atherosclerosis (ICD10-I70.0). Electronically Signed   By: JMarijo Conception M.D.   On: 06/17/2017 12:04   Dg Chest Portable 1 View  Result Date: 06/16/2017 CLINICAL DATA:  Three weeks of weakness, anorexia, emaciation. History of hepatitis-B, HIV-AIDS. EXAM: PORTABLE CHEST 1 VIEW COMPARISON:  Chest x-ray of June 11, 2017 FINDINGS: The lungs are hyperinflated. There is no focal infiltrate. There are subtle nodules of varying sizes up to 3 mm in diameter  in both lungs. Some likely reflect pulmonary vessels on the end but others are more peripheral. The heart and pulmonary vascularity are normal. The mediastinum is normal in width. There is no pleural effusion. IMPRESSION: No classic alveolar pneumonia. No CHF. Tiny pulmonary parenchymal nodules are suspected bilaterally and may reflect infectious or neoplastic processes. Chest CT scanning is recommended. Electronically Signed   By: David  Martinique M.D.   On: 06/16/2017 16:45     Kathrene Alu, MD 07/01/2017, 7:26 AM PGY-1, [place] Intern pager: [phone], text pages welcome

## 2017-07-01 NOTE — Progress Notes (Signed)
[facility]  Date of Admission:  06/16/2017              Total days of antibiotics 14          Amphotericin B  Day 14, stop 8/17            ASSESSMENT and PLAN: Disseminated cryptococcal disease- Meningitis and Pneumonia:  Left eye Ptosis Patient continues to have left eye ptosis which is likely 2/2 cryptococcal meningitis causing a cranial nerve defect. Opthamology consult 8/14 indicated that there is no optic neuropathy, retinitis, or intraocular infection. The patient's blurry vision is thought to be due to mild exposure keratosis.  -prescribed tear lubricant qhs ou -CN 3 palsy and partial palsy of 4 and 6.   -patch one eye if becomes symptomatic  Repeat LP today 8/15 to get 30mL removed.  -Document OP, closing pressure, cryptococcal antigen, bacterial and fungal culture, cell count differential with protein and glucose  Previous opening pressures to date 38 (8/4)>14 (8/4)> 15 (8/6)> 9.5 (8/8)  Although it is day 14 of patient being on amphotericin we have opted to continue therapy due to flucytosine being discontinued.  Will start fluconazole for 8 week therapy once discontinue amphotericin. Renal function is still good cr=0.54  -SLP assessment to be deferred to tomorrow 8/16 due to patient getting LP done today.   HIV/AIDS -Will not start antiretroviral therapy due to risk of IRIS. Will consider in 5 weeks. -Continue azithromycin and bactrim as patient is on ART     . amLODipine  10 mg Per Tube Daily  . azithromycin  1,200 mg Per Tube Weekly  . chlorhexidine  15 mL Mouth Rinse BID  . dextrose  10 mL Intravenous Q24H  . heparin subcutaneous  5,000 Units Subcutaneous Q8H  . lidocaine (PF)  5 mL Intradermal Once  . lidocaine (PF)      . mouth rinse  15 mL Mouth Rinse q12n4p  . pantoprazole sodium  40 mg Per Tube Daily  . sennosides  5 mL Per Tube QHS  . sulfamethoxazole-trimethoprim  1 tablet Per Tube Daily    SUBJECTIVE: [name] was seen sitting in a chair this morning. He stated that he was doing well and denied andy sob, chest pain, or headaches.   Review of Systems: ROS as above   No Active Allergies  OBJECTIVE: Vitals:   06/30/17 2241 07/01/17 0600 07/01/17 1011 07/01/17 1429  BP: (!) 144/79 140/70 129/76 134/78  Pulse: 73 72 74 63  Resp: 16   16  Temp: 99 F (37.2 C) 98.3 F (36.8 C)  98.4 F (36.9 C)  TempSrc: Oral Oral  Oral  SpO2: 98% 100%  97%  Weight:      Height:       Body mass index is 19.09 kg/m.  Physical Exam  Constitutional: He is well-developed, well-nourished, and in no distress. No distress.  HENT:  Head: Normocephalic and atraumatic.  Eyes:  Left eye ptosis present   Cardiovascular: Normal rate, regular rhythm, normal heart sounds and intact distal pulses.   Pulmonary/Chest: Effort normal and breath sounds normal. No respiratory distress. He has no wheezes.  Abdominal: Soft. Bowel sounds are normal. He exhibits no distension. There is no tenderness.  Skin: Rash noted.  Psychiatric: Mood, memory, affect and judgment normal.    Lab Results Lab Results  Component Value Date   WBC 2.2 (L) 07/01/2017   HGB 8.6 (L) 07/01/2017  HCT 26.4 (L) 07/01/2017   MCV 80.0 07/01/2017   PLT 255 07/01/2017    Lab Results  Component Value Date   CREATININE 0.54 (L) 07/01/2017   BUN 10 07/01/2017   NA 130 (L) 07/01/2017   K 3.7 07/01/2017   CL 99 (L) 07/01/2017   CO2 25 07/01/2017    Lab Results  Component Value Date   ALT 47 06/27/2017   AST 53 (H) 06/27/2017   ALKPHOS 77 06/27/2017   BILITOT 0.2 (L) 06/27/2017     Microbiology: Recent Results (from the past 240 hour(s))  CSF culture     Status: Abnormal   Collection Time: 06/22/17  4:25 PM  Result Value Ref Range Status   Specimen Description CSF  Final   Special Requests NONE  Final   Gram Stain   Final    CYTOSPIN SMEAR WBC PRESENT,BOTH PMN AND MONONUCLEAR ENCAPSULATED YEAST SEEN CRITICAL RESULT CALLED TO,  READ BACK BY AND VERIFIED WITH: Teodoro Spray. Turner RN 17:10 06/22/17 ([email or web address])    Culture (A)  Final    CRYPTOCOCCUS NEOFORMANS CRITICAL VALUE NOTED.  VALUE IS CONSISTENT WITH PREVIOUSLY REPORTED AND CALLED VALUE.    Report Status 06/26/2017 FINAL  Final  Fungus Culture With Stain     Status: None   Collection Time: 06/22/17  4:25 PM  Result Value Ref Range Status   Fungus Stain Final report  Final   Fungus (Mycology) Culture Preliminary report  Final    Comment: (NOTE) Performed At: [facility] [address] [place], [place] [id number] Mila HomerHancock William F MD WU:[id number]:[phone]    Fungal Source CSF  Final  Fungus Culture Result     Status: None   Collection Time: 06/22/17  4:25 PM  Result Value Ref Range Status   Result 1 Comment  Final    Comment: (NOTE) KOH/Calcofluor preparation:  no fungus observed. Performed At: [facility] [address] [place], [place] [id number] Mila HomerHancock William F MD VH:[id number]:[phone]   Fungal organism reflex     Status: None   Collection Time: 06/22/17  4:25 PM  Result Value Ref Range Status   Fungal result 1 Comment  Final    Comment: (NOTE) Yeast isolated, identification in progress. Identification to follow. REPORTED TO LISA T. ON 06/29/17 AT 1516 EST, JP FAXED TO [phone] Performed At: [name] [address] [place], [place] [id number] Mila HomerHancock William F MD HK:[id number]:[phone]   Culture, fungus without smear     Status: Abnormal (Preliminary result)   Collection Time: 06/24/17  5:35 PM  Result Value Ref Range Status   Specimen Description CSF  Final   Special Requests NONE  Final   Culture CRYPTOCOCCUS NEOFORMANS (A)  Final   Report Status PENDING  Incomplete  CSF culture     Status: None (Preliminary result)   Collection Time: 07/01/17  1:57 PM  Result Value Ref Range Status   Specimen Description CSF  Final   Special Requests NONE  Final   Gram Stain   Final    WBC PRESENT, PREDOMINANTLY MONONUCLEAR ENCAPSULATED YEAST  SEEN CYTOSPIN SMEAR CRITICAL RESULT CALLED TO, READ BACK BY AND VERIFIED WITH: K. PRICE, RN AT 1510 ON 07/01/17 BY C. JESSUP, MLT.    Culture PENDING  Incomplete   Report Status PENDING  Incomplete    Lorenso CourierVahini Riannah Stagner, MD [facility] [phone] pager   [phone] cell 07/01/2017, 4:25 PM

## 2017-07-01 NOTE — Progress Notes (Signed)
SLP Cancellation Note  Patient Details Name: Kenneth Rivas MRN: [id number] DOB: Sep 25, 1961   Cancelled treatment:       Reason Eval/Treat Not Completed: Medical issues which prohibited therapy. Orders received for reassessment of swallowing. SLP still following for dysphagia treatment; however, per chart pt had LP done this afternoon. Will defer swallow tx this afternoon as he will have to lay flat post-procedure. SLP will continue to follow as able.   Kenneth Rivas, Kenneth Rivas 07/01/2017, 3:24 PM  Kenneth HamLaura Paiewonsky, M.A. CCC-SLP [phone]

## 2017-07-01 NOTE — Progress Notes (Signed)
Occupational Therapy Treatment Patient Details Name: Kenneth Rivas MRN: [id number] DOB: November 25, 1960 Today's Date: 07/01/2017    History of present illness 56 y.o. 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 Admitted on 7/31 w/ cc: weakness and FTT. Admitting dx: FTT and cachexia d/t AIDs, pulmonary nodules, bedbugs, and HTN. Had CT chest showing cavitary lung lesion in the RML. 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 Intubated 8/3-8/11.   OT comments  Pt demonstrating progress toward OT goals. He was able to complete standing grooming tasks and toileting hygiene with mod assist to maintain balance this session. Pt initially reporting decreased confidence in his ability to participate in these tasks but with encouragement was able to complete with improved independence. He additionally demonstrated improved activity tolerance for ADL and was able to complete 2 consecutive tasks this session with increased time and no therapeutic rest breaks. Continue to recommend SNF level rehabilitation as pt remains unsteady and at significant risk of falling during ADL participation. Note difficulty with placement and will continue to follow and increase OT frequency to maximize functional gains while admitted in case pt unable to obtain post-acute rehabilitation.    Follow Up Recommendations  SNF    Equipment Recommendations  3 in 1 bedside commode;Tub/shower bench    Recommendations for Other Services      Precautions / Restrictions Precautions Precautions: Fall Restrictions Weight Bearing Restrictions: No       Mobility Bed Mobility Overal bed mobility: Needs Assistance Bed Mobility: Supine to Sit     Supine to sit: Min assist     General bed mobility comments: Pt moved legs off of bed on his own but required min assist to raise trunk.    Transfers Overall transfer level: Needs assistance Equipment used: Rolling walker (2 wheeled) Transfers: Sit to/from Stand Sit to Stand: Min assist         General transfer comment: Cues for hand placement and assist to shift weight over feet.     Balance Overall balance assessment: Needs assistance Sitting-balance support: No upper extremity supported;Feet supported Sitting balance-Leahy Scale: Fair Sitting balance - Comments: Sitting at EOB no back support statically. Assist for dynamic.    Standing balance support: Bilateral upper extremity supported Standing balance-Leahy Scale: Poor Standing balance comment: Reliant on BUE support and external assistance.                           ADL either performed or assessed with clinical judgement   ADL Overall ADL's : Needs assistance/impaired     Grooming: Moderate assistance;Standing Grooming Details (indicate cue type and reason): Mod assist for standing balance.  Upper Body Bathing: Moderate assistance;Sitting   Lower Body Bathing: Moderate assistance;Sit to/from stand Lower Body Bathing Details (indicate cue type and reason): Mod assist for balance.          Toilet Transfer: Minimal assistance;RW;Ambulation Statistician Details (indicate cue type and reason): VC's for safety on sitting.  Toileting- Clothing Manipulation and Hygiene: Moderate assistance;Sit to/from stand       Functional mobility during ADLs: Minimal assistance;Rolling walker General ADL Comments: VC's to facilitate upright positioning and engage core stability during ADL tasks seated at EOB. Pt quick to report that he cannot do something but surprises himself when encouraged to try.      Vision   Additional Comments: L eye ptosis remains.  Perception     Praxis      Cognition Arousal/Alertness: Awake/alert Behavior During Therapy: Flat affect Overall Cognitive Status: No family/caregiver present to determine baseline  cognitive functioning                                 General Comments: Slow to respond at times.        Exercises     Shoulder Instructions       General Comments Drooling consistently with poor oral motor skills to control secretions.    Pertinent Vitals/ Pain       Pain Assessment: No/denies pain Pain Intervention(s): Monitored during session;Repositioned  Home Living                                          Prior Functioning/Environment              Frequency  Min 2X/week        Progress Toward Goals  OT Goals(current goals can now be found in the care plan section)  Progress towards OT goals: Progressing toward goals (AMPAC score decreased due to standing activity initiated)  Acute Rehab OT Goals Patient Stated Goal: None stated OT Goal Formulation: With patient Time For Goal Achievement: 07/13/17 Potential to Achieve Goals: Good  Plan Discharge plan remains appropriate    Co-evaluation                 AM-PAC PT "6 Clicks" Daily Activity     Outcome Measure   Help from another person eating meals?: Total Help from another person taking care of personal grooming?: A Lot Help from another person toileting, which includes using toliet, bedpan, or urinal?: A Lot Help from another person bathing (including washing, rinsing, drying)?: A Lot Help from another person to put on and taking off regular upper body clothing?: A Lot Help from another person to put on and taking off regular lower body clothing?: A Lot 6 Click Score: 11    End of Session Equipment Utilized During Treatment: Rolling walker  OT Visit Diagnosis: Muscle weakness (generalized) (M62.81);Low vision, both eyes (H54.2)   Activity Tolerance Patient tolerated treatment well   Patient Left in bed;with call bell/phone within reach;with bed alarm set   Nurse Communication Other (comment);Mobility status (RN cleared for participation with OT)         Time: [id number] OT Time Calculation (min): 24 min  Charges: OT General Charges $OT Visit: 1 Procedure OT Treatments $Self Care/Home Management : 23-37 mins  Doristine Sectionharity A Trevell Pariseau, MS OTR/L  Pager: [phone]    Cyana Shook A Clifton Safley 07/01/2017, 9:37 AM

## 2017-07-01 NOTE — Progress Notes (Signed)
Nutrition Follow-up  DOCUMENTATION CODES:   Underweight, Severe malnutrition in context of chronic illness  INTERVENTION:   -Once medically able, resume:  Jevity 1.2 @ 65 ml/hr via NGT  Tube feeding regimen provides 1872 kcal (100% of needs), 87 grams of protein, and 1259 ml of H2O.  NUTRITION DIAGNOSIS:   Malnutrition (Severe) related to chronic illness (HIV) as evidenced by severe depletion of body fat, severe depletion of muscle mass, energy intake < 75% for > or equal to 1 month.  Ongoing  GOAL:   Patient will meet greater than or equal to 90% of their needs  Met with TF  MONITOR:   Diet advancement, Labs, Weight trends, TF tolerance, Skin, I & O's  REASON FOR ASSESSMENT:   Consult Enteral/tube feeding initiation and management  ASSESSMENT:   Pt admitted with bed bugs, cachexia and feeling weak and tired x past 4 weeks, presumed cryptoccoal meningitis. PMH is significant for AIDS, hep B, cocaine use, and HTN.    8/11- extubated 8/12- s/p BSE and MBSS, recommending continued NPO status   Pt awaiting LP today; TF on hold at this time.   Pt has no complaints ("everything's all good"). Per ID notes, pt is requesting to eat- SLP to repeat eval.  Labs reviewed: Na: 130, Mg: 1.5, CBGS: 90-112.   Diet Order:  Diet NPO time specified  Skin:  Reviewed, no issues  Last BM:  06/28/17  Height:   Ht Readings from Last 1 Encounters:  06/19/17 '5\' 6"'$  (1.676 m)    Weight:   Wt Readings from Last 1 Encounters:  06/30/17 118 lb 4.8 oz (53.7 kg)    Ideal Body Weight:  50 kg  BMI:  Body mass index is 19.09 kg/m.  Estimated Nutritional Needs:   Kcal:  9371-6967  Protein:  85-100 grams  Fluid:  > 1.7 L  EDUCATION NEEDS:   Education needs addressed  Dillan Lunden A. Jimmye Norman, RD, LDN, CDE Pager: [phone] After hours Pager: [phone]

## 2017-07-01 NOTE — Procedures (Signed)
Lumbar puncture done at L2/3 without complications. See dictated radiology report for additional details

## 2017-07-01 NOTE — Progress Notes (Signed)
      INFECTIOUS DISEASE ATTENDING ADDENDUM:   Date: 07/01/2017  Patient name: Kenneth Rivas  Medical record number: [id number]  Date of birth: 02-22-1961    This patient has been seen and discussed with the house staff. Please see the resident's note for complete details. I concur with their findings with the following additions/corrections:   When I examined [name] this morning he was still wanting to build E. Note speech therapy note from the 13th noted in the was not sufficiently strong to swallow reliably on his own.   Per his request I asked for speech to come and reevaluate him though much made out of changed in 2 days time.  He is awaiting lumbar puncture today with documentation of opening pressure removal of 20-30 mL of CSF and CSF should be sent for cryptococcal antigen and bacterial culture and fungal culture as well as cell count differential protein and glucose.  If he is still growing Cryptococcus from his CSF we will prolong his amphotericin and possibly add fluconazole.   If he does not growing Cryptococcus will feel comfortable changing him from amphotericin to fluconazole.  Ophthalmology is seen him and excluded retinitis or second process in the left eye. Greatly appreciate their help.  As above ARV will need to be delayed until he is reach the 5 week point in treatment of his cryptococcal meningoencephalitis.  Dr. Orvan Falconerampbell to take over the service tomorrow.   Acey LavCornelius Van Dam 07/01/2017, 12:11 PM

## 2017-07-02 DIAGNOSIS — H4910 Fourth [trochlear] nerve palsy, unspecified eye: Secondary | ICD-10-CM

## 2017-07-02 DIAGNOSIS — H4900 Third [oculomotor] nerve palsy, unspecified eye: Secondary | ICD-10-CM

## 2017-07-02 DIAGNOSIS — G039 Meningitis, unspecified: Secondary | ICD-10-CM

## 2017-07-02 DIAGNOSIS — G509 Disorder of trigeminal nerve, unspecified: Secondary | ICD-10-CM

## 2017-07-02 DIAGNOSIS — R1310 Dysphagia, unspecified: Secondary | ICD-10-CM

## 2017-07-02 DIAGNOSIS — B450 Pulmonary cryptococcosis: Secondary | ICD-10-CM

## 2017-07-02 LAB — CBC WITH DIFFERENTIAL/PLATELET
BASOS ABS: 0 10*3/uL (ref 0.0–0.1)
BASOS PCT: 1 %
EOS ABS: 0.2 10*3/uL (ref 0.0–0.7)
Eosinophils Relative: 7 %
HEMATOCRIT: 26 % — AB (ref 39.0–52.0)
HEMOGLOBIN: 8.7 g/dL — AB (ref 13.0–17.0)
Lymphocytes Relative: 24 %
Lymphs Abs: 0.5 10*3/uL — ABNORMAL LOW (ref 0.7–4.0)
MCH: 27.2 pg (ref 26.0–34.0)
MCHC: 33.5 g/dL (ref 30.0–36.0)
MCV: 81.3 fL (ref 78.0–100.0)
Monocytes Absolute: 0.2 10*3/uL (ref 0.1–1.0)
Monocytes Relative: 8 %
NEUTROS ABS: 1.3 10*3/uL — AB (ref 1.7–7.7)
NEUTROS PCT: 60 %
Platelets: 254 10*3/uL (ref 150–400)
RBC: 3.2 MIL/uL — ABNORMAL LOW (ref 4.22–5.81)
RDW: 15 % (ref 11.5–15.5)
WBC: 2.2 10*3/uL — AB (ref 4.0–10.5)

## 2017-07-02 LAB — BASIC METABOLIC PANEL
ANION GAP: 5 (ref 5–15)
BUN: 11 mg/dL (ref 6–20)
CALCIUM: 8.4 mg/dL — AB (ref 8.9–10.3)
CHLORIDE: 100 mmol/L — AB (ref 101–111)
CO2: 25 mmol/L (ref 22–32)
Creatinine, Ser: 0.54 mg/dL — ABNORMAL LOW (ref 0.61–1.24)
GFR calc non Af Amer: >60 mL/min (ref >60)
Glucose, Bld: 108 mg/dL — ABNORMAL HIGH (ref 65–99)
Potassium: 4 mmol/L (ref 3.5–5.1)
SODIUM: 130 mmol/L — AB (ref 135–145)

## 2017-07-02 LAB — GLUCOSE, CAPILLARY
GLUCOSE-CAPILLARY: 110 mg/dL — AB (ref 65–99)
GLUCOSE-CAPILLARY: 97 mg/dL (ref 65–99)
GLUCOSE-CAPILLARY: 97 mg/dL (ref 65–99)
Glucose-Capillary: 102 mg/dL — ABNORMAL HIGH (ref 65–99)
Glucose-Capillary: 110 mg/dL — ABNORMAL HIGH (ref 65–99)
Glucose-Capillary: 127 mg/dL — ABNORMAL HIGH (ref 65–99)

## 2017-07-02 LAB — PHOSPHORUS: PHOSPHORUS: 4.2 mg/dL (ref 2.5–4.6)

## 2017-07-02 LAB — MAGNESIUM: MAGNESIUM: 1.6 mg/dL — AB (ref 1.7–2.4)

## 2017-07-02 MED ORDER — DEXTROSE 5% FOR FLUSHING BEFORE AND AFTER AMBISOME
10.0000 mL | INTRAVENOUS | Status: DC
  Administered 2017-07-03 – 2017-07-12 (×12): 10 mL via INTRAVENOUS
  Filled 2017-07-02 (×12): qty 10

## 2017-07-02 MED ORDER — POLYVINYL ALCOHOL 1.4 % OP SOLN
1.0000 [drp] | Freq: Every day | OPHTHALMIC | Status: DC
  Administered 2017-07-02 – 2017-07-10 (×9): 1 [drp] via OPHTHALMIC
  Filled 2017-07-02: qty 15

## 2017-07-02 MED ORDER — POLYVINYL ALCOHOL 1.4 % OP SOLN: 1.0000 [drp] | Freq: Four times a day (QID) | OPHTHALMIC | Status: DC

## 2017-07-02 MED ORDER — MAGNESIUM SULFATE 2 GM/50ML IV SOLN
2.0000 g | Freq: Once | INTRAVENOUS | Status: AC
  Administered 2017-07-02: 2 g via INTRAVENOUS
  Filled 2017-07-02: qty 50

## 2017-07-02 MED ORDER — RESOURCE THICKENUP CLEAR PO POWD
Freq: Once | ORAL | Status: AC
  Administered 2017-07-02: 16:00:00 via ORAL
  Filled 2017-07-02: qty 125

## 2017-07-02 NOTE — Progress Notes (Signed)
NG tube found to be coiled in throat by speech. MD notified and orders to remove NG tube. NG tube removed, patient tolerated well. Will continue to monitor.

## 2017-07-02 NOTE — Progress Notes (Signed)
Physical Therapy Treatment Patient Details Name: Kenneth Rivas MRN: [id number] DOB: 1961-09-23 Today's Date: 07/02/2017    History of Present Illness 56 y.o. 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 Admitted on 7/31 w/ cc: weakness and FTT. Admitting dx: FTT and cachexia d/t AIDs, pulmonary nodules, bedbugs, and HTN. Had CT chest showing cavitary lung lesion in the RML. 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 Intubated 8/3-8/11.    PT Comments    Pt progressing well with mobility, noted improving balance with ambulation. He ambulated 100' with RW with min/guard assist. Performed seated BLE strengthening exercises.   Follow Up Recommendations  SNF;Supervision/Assistance - 24 hour     Equipment Recommendations  Other (comment) (TBA)    Recommendations for Other Services       Precautions / Restrictions Precautions Precautions: Fall Restrictions Weight Bearing Restrictions: No    Mobility  Bed Mobility Overal bed mobility: Modified Independent Bed Mobility: Supine to Sit     Supine to sit: Modified independent (Device/Increase time)     General bed mobility comments: HOB up  Transfers Overall transfer level: Needs assistance Equipment used: Rolling walker (2 wheeled) Transfers: Sit to/from Stand Sit to Stand: Min assist         General transfer comment: Cues for hand placement and assist to shift weight over feet.   Ambulation/Gait Ambulation/Gait assistance: Min assist;Min guard Ambulation Distance (Feet): 100 Feet Assistive device: Rolling walker (2 wheeled) Gait Pattern/deviations: Step-through pattern     General Gait Details: improved balance with ambulation, min/guard for walking forward, min A for balance taking several steps backwards towards commode   Stairs            Wheelchair Mobility     Modified Rankin (Stroke Patients Only)       Balance Overall balance assessment: Needs assistance Sitting-balance support: No upper extremity supported;Feet supported Sitting balance-Leahy Scale: Fair Sitting balance - Comments: Sitting at EOB no back support statically. Assist for dynamic.    Standing balance support: Bilateral upper extremity supported Standing balance-Leahy Scale: Poor Standing balance comment: Reliant on BUE support and external assistance.                            Cognition Arousal/Alertness: Awake/alert Behavior During Therapy: WFL for tasks assessed/performed Overall Cognitive Status: Within Functional Limits for tasks assessed                                        Exercises General Exercises - Lower Extremity Ankle Circles/Pumps: AROM;Both;15 reps;Seated Long Arc Quad: AROM;Both;10 reps;Seated (with manual resistance) Hip Flexion/Marching: AROM;Both;10 reps;Seated    General Comments        Pertinent Vitals/Pain Pain Assessment: No/denies pain    Home Living                      Prior Function            PT Goals (current goals can now be found in the care plan section) Acute Rehab PT Goals Patient Stated Goal: to do yardwork PT Goal Formulation: With patient Time For Goal Achievement: 07/08/17 Potential to Achieve Goals: Fair Progress towards PT goals: Progressing toward goals    Frequency    Min 3X/week  PT Plan Current plan remains appropriate    Co-evaluation              AM-PAC PT "6 Clicks" Daily Activity  Outcome Measure  Difficulty turning over in bed (including adjusting bedclothes, sheets and blankets)?: A Little Difficulty moving from lying on back to sitting on the side of the bed? : A Little Difficulty sitting down on and standing up from a chair with arms (e.g., wheelchair, bedside commode, etc,.)?: Total Help needed moving to and from a bed to chair  (including a wheelchair)?: A Little Help needed walking in hospital room?: A Little Help needed climbing 3-5 steps with a railing? : A Lot 6 Click Score: 15    End of Session Equipment Utilized During Treatment: Gait belt Activity Tolerance: Patient tolerated treatment well Patient left: in bed;with call bell/phone within reach;with bed alarm set Nurse Communication: Mobility status PT Visit Diagnosis: Difficulty in walking, not elsewhere classified (R26.2);Muscle weakness (generalized) (M62.81);Other abnormalities of gait and mobility (R26.89)     Time: 1029-1101 PT Time Calculation (min) (ACUTE ONLY): 32 min  Charges:  $Gait Training: 8-22 mins $Therapeutic Exercise: 8-22 mins                    G Codes:          Tamala Ser 07/02/2017, 11:08 AM [phone]

## 2017-07-02 NOTE — Progress Notes (Signed)
Family Medicine Teaching Service Daily Progress Note Intern Pager: [phone]  Patient name: Kenneth Rivas Medical record number: [id number] Date of birth: 21-Jan-1961 Age: 56 y.o. Gender: male  Primary Care Provider: Tommy Medal, Lavell Islam, MD Consultants: ID Code Status: FULL  Pt Overview and Major Events to Date:  Admitted on 7/31, CD4 count 2 On 8/1, found to have cryptococcal antigen in serum On 8/3, Transferred to ICU for LP due to risk of intubation/vent, was intubated after transfer On 8/11, Extubated On 8/13, Transferred to [facility] and Plan:  Kenneth Rivas a 56 y.o.malepresenting with cachexia and feeling weak and tired x past 4 weeks. PMH is significant for AIDS, hep B, cocaine use, and HTN.   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  On day 13 of amphotericin; flucytosine was abondoned due toxicity.  - ID will determine length of amphotericin B treatment depending on results of CSF fluid culture from 8/15--> current plan: Amphotericin B  Day 14, stop 8/17 - ID recommended repeat LP: IR was consulted on 8/15 to do the LP- culture pending- WBC present, predominately mononuclear encapsulated yeast seen- normal opening pressure - experiencing left eye ptosis and blurry vision out of left eye; ophthalmologist saw 8/14- 3rd, and mild 4th and 6th nerve palsy will cause double vision- will likley resolve as inflammation resolves. Not concerned about CMV retinitis and thinks vision change is due to exposure keratitis- prescribed tear lubricant QHS.  Will continue to follow.  Pneumonia: Pseudomonas found on sputum culture. Currently on ceftazidime 1g infusion TID day 8.  Also on  azithromycin 1.2 g weekly and Bactrim DS 800-160 mg daily for prophylaxis due to AIDS with CD4 of 2.   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 - NPO due to dysphagia and failed swallow study, receiving nutrition through NG tube, which came out on 8/14 and was reinserted, dietitian to adjust - continue to monitor Mag, K, and Phos, replete as needed - does not qualify for SNF placement due to lack of insurance.  Will need arrangement for a different home situation due to his chronic illness and unsafe current situation. - low Mag of 1.5 on 8/15, repleted by pharmacy--> 1.6 on 8/16.  HTN: Hypertensive throughout hospital stay, initially attributed to recent cocaine use. At home on Norvasc 5 mg daily however patient reports as not taking.  Most current BP 129/71 on 8/16. -Continue Norvasc 10 mg -Continue Hydral 5 mg with parameters: SBP>170 or DBP>110  -Monitor blood pressures  Hepatitis B: Not currently taking medication and does not follow with Infectious Disease. LFTs with AST 19, ALT 13 and alk phos 58. Normal total bili 0.8 and direct bili 0.2.   -Continue to monitor   H/o drug use and insecure home environment:In ED, UDS +for cocaine and amphetamines. May have contributed to his elevated BP on admission depending on time of ingestion.  -CSW consulted and has been in contact with patient's sister, [name].  Will be able to make more progress on social needs now that patient has been extubated   FEN/GI: NPO due to failed barium swallow study Prophylaxis: Lovenox   Disposition:Continue on med-surg   Subjective:  Kenneth Rivas says that he feels fine this morning and has no complaints, aside from wanting  to eat. He says he is unable to walk because he feels so weak in his legs from not eating.   Objective: Temp:  [97.4 F (36.3 C)-98.4 F (36.9 C)] 98 F (36.7 C) (08/16 0516) Pulse Rate:  [63-74] 68 (08/16 0516) Resp:  [16-17] 16 (08/16 0516) BP:  (125-134)/(71-78) 129/71 (08/16 0516) SpO2:  [97 %-100 %] 97 % (08/16 0516) Weight:  [110 lb 12.8 oz (50.3 kg)] 110 lb 12.8 oz (50.3 kg) (08/16 0516) Physical Exam: General: ill appearing and frail, awake and alert Cardiovascular: RRR, no MRG Respiratory: no visible increase resp effort, no coughing during exam Abdomen: soft, nontender to palpation Extremities: Frail, nails cracked w/ nail fungus with dirt underneath, visible bony joints  Laboratory:  Recent Labs Lab 06/30/17 0344 07/01/17 0615 07/02/17 0532  WBC 2.5* 2.2* 2.2*  HGB 8.8* 8.6* 8.7*  HCT 27.1* 26.4* 26.0*  PLT 257 255 254    Recent Labs Lab 06/27/17 1330  06/30/17 0344 07/01/17 0615 07/02/17 0532  NA  --   < > 129* 130* 130*  K  --   < > 3.8 3.7 4.0  CL  --   < > 98* 99* 100*  CO2  --   < > _0 BUN  --   < > _1 CREATININE  --   < > 0.51* 0.54* 0.54*  CALCIUM  --   < > 8.3* 8.4* 8.4*  PROT 7.4  --   --   --   --   BILITOT 0.2*  --   --   --   --   ALKPHOS 77  --   --   --   --   ALT 47  --   --   --   --   AST 53*  --   --   --   --   GLUCOSE  --   < > 115* 110* 108*  < > = values in this interval not displayed.  Phos 4.0, Mag 1.5  Imaging/Diagnostic Tests: Ct Chest W Contrast  Result Date: 06/17/2017 CLINICAL DATA:  Shortness of breath. EXAM: CT CHEST WITH CONTRAST TECHNIQUE: Multidetector CT imaging of the chest was performed during intravenous contrast administration. CONTRAST:  60m ISOVUE-300 IOPAMIDOL (ISOVUE-300) INJECTION 61% COMPARISON:  Radiographs of June 16, 2017. FINDINGS: Cardiovascular: Normal heart size. No pericardial effusion. Atherosclerosis of thoracic aorta is noted. No dissection or aneurysm is noted. Mediastinum/Nodes: No enlarged mediastinal, hilar, or axillary lymph nodes. Thyroid gland, trachea, and esophagus demonstrate no significant findings. Lungs/Pleura: No pneumothorax or pleural effusion is noted. 3 mm nodule is noted in left upper lobe best seen on image  number 54 of series 4. 7 mm cavitating abnormality is noted in the right middle lobe best seen on image number 104 series 4. Upper Abdomen: No acute abnormality. Musculoskeletal: No chest wall abnormality. No acute or significant osseous findings. IMPRESSION: 7 mm cavitary lesion is seen in the right middle lobe. It is uncertain if this represents cavitary metastatic lesion or small pulmonary abscess. Clinical correlation is recommended. Follow-up CT scan in 6-8 weeks is recommended to determine stability or resolution. Aortic Atherosclerosis (ICD10-I70.0). Electronically Signed   By: JMarijo Conception M.D.   On: 06/17/2017 12:04   Dg Chest Portable 1 View  Result Date: 06/16/2017 CLINICAL DATA:  Three weeks of weakness, anorexia, emaciation. History of hepatitis-B, HIV-AIDS. EXAM: PORTABLE CHEST 1 VIEW COMPARISON:  Chest x-ray of June 11, 2017 FINDINGS: The lungs are  hyperinflated. There is no focal infiltrate. There are subtle nodules of varying sizes up to 3 mm in diameter in both lungs. Some likely reflect pulmonary vessels on the end but others are more peripheral. The heart and pulmonary vascularity are normal. The mediastinum is normal in width. There is no pleural effusion. IMPRESSION: No classic alveolar pneumonia. No CHF. Tiny pulmonary parenchymal nodules are suspected bilaterally and may reflect infectious or neoplastic processes. Chest CT scanning is recommended. Electronically Signed   By: David  [place] M.D.   On: 06/16/2017 16:45     Talar Fraley, [place], DO 07/02/2017, 9:17 AM PGY-1, [facility] Intern pager: [phone], text pages welcome

## 2017-07-02 NOTE — Progress Notes (Addendum)
  Speech Language Pathology Treatment: Dysphagia  Patient Details Name: Kenneth Rivas MRN: [id number] DOB: 07/20/1961 Today's Date: 07/02/2017 Time: 1500-1540 SLP Time Calculation (min) (ACUTE ONLY): 40 min  Assessment / Plan / Recommendation Clinical Impression  Ongoing f/u for dysphagia.  During review of MBS, NG noted to be coiled at base of tongue- this is not visible during oral assessment.  Spoke with MD and received orders for its removal.  Repeat clinical swallow study revealed improved performance, but pt with persisting concerns for impaired airway protection with thin liquids.  Belching observed throughout assessment; there may be persisting esophageal component as observed per prior MBS.  Also notable is bilateral labial weakness - pt unable to retract or protrude lips; speech is marked by absence of labial consonants; drinking from cup results in immediate anterior spillage.    Recommend repeating MBS next date.  For tonight, start modified diet of dysphagia 2, honey-thick liquids from spoon or straw to improve oral control. Pt was upbeat and pleased with plan. D/W RN, MD.      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 Admitted on 7/31 w/ cc: weakness and FTT. Admitting dx: FTT and cachexia d/t AIDs, pulmonary nodules, bedbugs, and HTN. Had CT chest showing cavitary lung lesion in the RML. 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 Intubated 8/3-8/11.       SLP Plan  Start dysphagia 2, honey-thick liquids for tonight; MBS next date       Recommendations  Diet recommendations: Dysphagia 2 (fine chop);Honey-thick liquid Liquids provided via: Straw;Teaspoon Medication Administration: Crushed with puree Supervision: Patient able to self feed Compensations: Slow rate;Small sips/bites Postural Changes and/or Swallow  Maneuvers: Seated upright 90 degrees                Oral Care Recommendations: Oral care BID SLP Visit Diagnosis: Dysphagia, oropharyngeal phase (R13.12) Plan: MBS       GO             Brian Zeitlin L. Samson Fredericouture, [place] CCC/SLP Pager [phone]   Blenda MountsCouture, Brandt Chaney Laurice 07/02/2017, 3:47 PM

## 2017-07-02 NOTE — Progress Notes (Signed)
[facility]  Date of Admission:  06/16/2017   Total days of antibiotics 15        Day 15 Amphotericin B         ASSESSMENT: Disseminated cryptococcal disease- Meningitis and Pneumonia:  Left eye Ptosis Dysphagia  Kenneth Rivas has disseminated cryptococcal disease intracranially and in his lungs. The patient is currently being treated with liposomal amphotericin b (day 15, 8/16) without complications (renal function- cr=0.54 (8/16))  Lumbar puncture done 8/15 by IR showed a OP=6.5 cm with 23 ml of CSF removed. The OP is good and has downtrended from previous 38 (8/4)>14 (8/4)> 15 (8/6)> 9.5 (8/8).  CSF results (culture and gram stain) from LP on 8/15 show no growth on csf for <24hrs, wbc present with predominant mononuclear cells, and presence of encapsulated yeast (likely nonviable yeast). The patient appears to have clinically improved and csf appears sterile to date as expected post 2 weeks of therapy for cryptococcal meningitis. Moreover, the patient's opening pressures continue to look reassuring. Considering that Kenneth Rivas has been on sole therapy with amphotericin instead of combination therapy with amphotericin and flucytosine due to cytopenia we have opted to continue therapy with amphotericin till final csf culture results are obtained.   The patients left eye ptosis is presumed to be due to inflammation of the meninges that is irritating cn 3,4, and 6 and therefore causing the left eye droop. Ocular symptoms will resolve as patients meningeal inflammation decreases. The patients accompanied blurry vision is thought to be due to mild exposure keratosis.   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 Will start fluconazole consolidation therapy for 8 weeks if csf sterile. -Pending final csf culture result -CN 3,4, and 6 palsy is expected to resolve as meningeal inflammation decreases -continue tear lubricant qhs ou, consider a patch if patient starts to develop diplopia  -Request repeat assessment from SLP for dysphagia  HIV/AIDS -Will not start antiretroviral therapy due to risk of IRIS. Will consider in 5 weeks. -Continue azithromycin and bactrim as patient is on ART    . amLODipine  10 mg Per Tube Daily  . azithromycin  1,200 mg Per Tube Weekly  . chlorhexidine  15 mL Mouth Rinse BID  . dextrose  10 mL Intravenous Q24H  . dextrose  10 mL Intravenous Q24H  . heparin subcutaneous  5,000 Units Subcutaneous Q8H  . lidocaine (PF)  5 mL Intradermal Once  . mouth rinse  15 mL Mouth Rinse q12n4p  . pantoprazole sodium  40 mg Per Tube Daily  . sennosides  5 mL Per Tube QHS  . sulfamethoxazole-trimethoprim  1 tablet Per Tube Daily    SUBJECTIVE: Kenneth Rivas was seen sitting in a chair. He denied any headaches, blurry vision, sob, or chest pain. He continues to state that he is hungry and would like to eat. He also has weakness in his legs that he feels is due to him not being able to eat.   Review of Systems: ROS as above  No Active Allergies  OBJECTIVE: Vitals:   07/01/17 1011 07/01/17 1429 07/01/17 2337 07/02/17 0516  BP: 129/76 134/78 125/78 129/71  Pulse: 74 63 73 68  Resp:  16 17 16  Temp:  98.4 F (36.9 C) (!) 97.4 F (36.3 C) 98 F (36.7 C)  TempSrc:  Oral Oral   SpO2:  97% 100% 97%  Weight:    110 lb 12.8 oz (50.3 kg)  Height:       Body mass index is 17.88 kg/m.  Physical Exam  Constitutional: He appears malnourished. He appears not jaundiced. He appears cachectic.  HENT:  Head: Normocephalic and atraumatic.  Eyes: Conjunctivae are normal.  Cardiovascular: Normal rate, regular rhythm, normal heart sounds and intact distal pulses.     Pulmonary/Chest: Effort normal and breath sounds normal. No respiratory distress. He has no wheezes.  Abdominal: Soft. Bowel sounds are normal. He exhibits no distension. There is no tenderness.  Musculoskeletal:  Lower extremity weakness with 3/5 strength  Psychiatric: Mood, memory, affect and judgment normal.    Lab Results Lab Results  Component Value Date   WBC 2.2 (L) 07/02/2017   HGB 8.7 (L) 07/02/2017   HCT 26.0 (L) 07/02/2017   MCV 81.3 07/02/2017   PLT 254 07/02/2017    Lab Results  Component Value Date   CREATININE 0.54 (L) 07/02/2017   BUN 11 07/02/2017   NA 130 (L) 07/02/2017   K 4.0 07/02/2017   CL 100 (L) 07/02/2017   CO2 25 07/02/2017    Lab Results  Component Value Date   ALT 47 06/27/2017   AST 53 (H) 06/27/2017   ALKPHOS 77 06/27/2017   BILITOT 0.2 (L) 06/27/2017     Microbiology: Recent Results (from the past 240 hour(s))  CSF culture     Status: Abnormal   Collection Time: 06/22/17  4:25 PM  Result Value Ref Range Status   Specimen Description CSF  Final   Special Requests NONE  Final   Gram Stain   Final    CYTOSPIN SMEAR WBC PRESENT,BOTH PMN AND MONONUCLEAR ENCAPSULATED YEAST SEEN CRITICAL RESULT CALLED TO, READ BACK BY AND VERIFIED WITH: Teodoro Spray RN 17:10 06/22/17 ([email or web address])    Culture (A)  Final    CRYPTOCOCCUS NEOFORMANS CRITICAL VALUE NOTED.  VALUE IS CONSISTENT WITH PREVIOUSLY REPORTED AND CALLED VALUE.    Report Status 06/26/2017 FINAL  Final  Fungus Culture With Stain     Status: None   Collection Time: 06/22/17  4:25 PM  Result Value Ref Range Status   Fungus Stain Final report  Final   Fungus (Mycology) Culture Preliminary report  Final    Comment: (NOTE) Performed At: [facility] [address] [place], [place] [id number] Mila Homer MD WU:[id number]    Fungal Source CSF  Final  Fungus Culture Result     Status: None   Collection Time: 06/22/17  4:25 PM  Result Value Ref Range Status   Result 1 Comment   Final    Comment: (NOTE) KOH/Calcofluor preparation:  no fungus observed. Performed At: [facility] [address] [place], [place] [id number] Mila Homer MD VH:[id number]   Fungal organism reflex     Status: None   Collection Time: 06/22/17  4:25 PM  Result Value Ref Range Status   Fungal result 1 Comment  Final    Comment: (NOTE) Yeast isolated, identification in progress. Identification to follow. REPORTED TO LISA T. ON 06/29/17 AT 1516 EST, JP FAXED TO [phone] Performed At: [facility] [address] [place], [place] [id number] Mila Homer MD HK:[id number]   Culture, fungus without smear     Status: Abnormal (Preliminary result)   Collection Time: 06/24/17  5:35 PM  Result Value Ref Range Status   Specimen Description CSF  Final   Special Requests NONE  Final   Culture CRYPTOCOCCUS NEOFORMANS (A)  Final   Report Status PENDING  Incomplete  CSF culture     Status: None (Preliminary result)   Collection Time: 07/01/17  1:57 PM  Result Value Ref Range Status   Specimen Description CSF  Final   Special Requests NONE  Final   Gram Stain   Final    WBC PRESENT, PREDOMINANTLY MONONUCLEAR ENCAPSULATED YEAST SEEN CYTOSPIN SMEAR CRITICAL RESULT CALLED TO, READ BACK BY AND VERIFIED WITH: K. PRICE, RN AT 1510 ON 07/01/17 BY C. JESSUP, MLT.    Culture NO GROWTH < 24 HOURS  Final   Report Status PENDING  Incomplete    Lorenso Courier, MD [facility] [phone] pager   [phone] cell 07/02/2017, 9:48 AM

## 2017-07-03 ENCOUNTER — Inpatient Hospital Stay (HOSPITAL_COMMUNITY): Payer: Medicaid Other

## 2017-07-03 DIAGNOSIS — H538 Other visual disturbances: Secondary | ICD-10-CM

## 2017-07-03 DIAGNOSIS — H02402 Unspecified ptosis of left eyelid: Secondary | ICD-10-CM

## 2017-07-03 LAB — BASIC METABOLIC PANEL
ANION GAP: 6 (ref 5–15)
BUN: 11 mg/dL (ref 6–20)
CO2: 25 mmol/L (ref 22–32)
CREATININE: 0.65 mg/dL (ref 0.61–1.24)
Calcium: 8.4 mg/dL — ABNORMAL LOW (ref 8.9–10.3)
Chloride: 100 mmol/L — ABNORMAL LOW (ref 101–111)
Glucose, Bld: 94 mg/dL (ref 65–99)
Potassium: 4.1 mmol/L (ref 3.5–5.1)
SODIUM: 131 mmol/L — AB (ref 135–145)

## 2017-07-03 LAB — CBC WITH DIFFERENTIAL/PLATELET
BASOS ABS: 0 10*3/uL (ref 0.0–0.1)
BASOS PCT: 1 %
EOS ABS: 0.2 10*3/uL (ref 0.0–0.7)
EOS PCT: 8 %
HEMATOCRIT: 26.3 % — AB (ref 39.0–52.0)
Hemoglobin: 8.7 g/dL — ABNORMAL LOW (ref 13.0–17.0)
Lymphocytes Relative: 31 %
Lymphs Abs: 0.7 10*3/uL (ref 0.7–4.0)
MCH: 26.9 pg (ref 26.0–34.0)
MCHC: 33.1 g/dL (ref 30.0–36.0)
MCV: 81.4 fL (ref 78.0–100.0)
MONO ABS: 0.2 10*3/uL (ref 0.1–1.0)
MONOS PCT: 10 %
NEUTROS ABS: 1 10*3/uL — AB (ref 1.7–7.7)
Neutrophils Relative %: 50 %
PLATELETS: 243 10*3/uL (ref 150–400)
RBC: 3.23 MIL/uL — ABNORMAL LOW (ref 4.22–5.81)
RDW: 15.1 % (ref 11.5–15.5)
WBC: 2.1 10*3/uL — ABNORMAL LOW (ref 4.0–10.5)

## 2017-07-03 LAB — MAGNESIUM: MAGNESIUM: 1.6 mg/dL — AB (ref 1.7–2.4)

## 2017-07-03 LAB — GLUCOSE, CAPILLARY
GLUCOSE-CAPILLARY: 109 mg/dL — AB (ref 65–99)
GLUCOSE-CAPILLARY: 115 mg/dL — AB (ref 65–99)
GLUCOSE-CAPILLARY: 89 mg/dL (ref 65–99)
GLUCOSE-CAPILLARY: 95 mg/dL (ref 65–99)
Glucose-Capillary: 135 mg/dL — ABNORMAL HIGH (ref 65–99)
Glucose-Capillary: 98 mg/dL (ref 65–99)

## 2017-07-03 LAB — PHOSPHORUS: PHOSPHORUS: 4.2 mg/dL (ref 2.5–4.6)

## 2017-07-03 MED ORDER — MAGNESIUM SULFATE 2 GM/50ML IV SOLN
2.0000 g | Freq: Once | INTRAVENOUS | Status: AC
  Administered 2017-07-03: 2 g via INTRAVENOUS
  Filled 2017-07-03: qty 50

## 2017-07-03 MED ORDER — AMLODIPINE BESYLATE 10 MG PO TABS
10.0000 mg | ORAL_TABLET | Freq: Every day | ORAL | Status: DC
  Administered 2017-07-03 – 2017-07-14 (×12): 10 mg via ORAL
  Filled 2017-07-03 (×12): qty 1

## 2017-07-03 MED ORDER — PANTOPRAZOLE SODIUM 40 MG PO PACK
40.0000 mg | PACK | Freq: Every day | ORAL | Status: DC
  Administered 2017-07-03 – 2017-07-14 (×11): 40 mg via ORAL
  Filled 2017-07-03 (×12): qty 20

## 2017-07-03 MED ORDER — AZITHROMYCIN 600 MG PO TABS
1200.0000 mg | ORAL_TABLET | ORAL | Status: DC
  Administered 2017-07-04 – 2017-07-11 (×2): 1200 mg via ORAL
  Filled 2017-07-03 (×2): qty 2

## 2017-07-03 MED ORDER — SENNOSIDES 8.8 MG/5ML PO SYRP
5.0000 mL | ORAL_SOLUTION | Freq: Every day | ORAL | Status: DC
  Administered 2017-07-03 – 2017-07-09 (×6): 5 mL via ORAL
  Filled 2017-07-03 (×12): qty 5

## 2017-07-03 MED ORDER — SULFAMETHOXAZOLE-TRIMETHOPRIM 800-160 MG PO TABS
1.0000 | ORAL_TABLET | Freq: Every day | ORAL | Status: DC
  Administered 2017-07-03 – 2017-07-14 (×12): 1 via ORAL
  Filled 2017-07-03 (×12): qty 1

## 2017-07-03 MED ORDER — DIPHENHYDRAMINE HCL 12.5 MG/5ML PO ELIX: 25.0000 mg | ORAL_SOLUTION | Freq: Four times a day (QID) | ORAL | Status: DC | PRN

## 2017-07-03 NOTE — Progress Notes (Signed)
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 The patient is currently being treated with liposomal amphotericin b without complications (renal function- cr=0.65 (8/17))  Lumbar puncture done 8/15 by IR showed a OP=6.5 cm with 23 ml of CSF removed. The OP is good and has downtrended from previous 38 (8/4)>14 (8/4)> 15 (8/6)> 9.5 (8/8).  CSF results (culture and gram stain) from LP on 8/15 show wbc present with predominant mononuclear cells, and presence of encapsulated yeast (likely nonviable yeast). Final CSF culture results are pending. If CSF continues to remain sterile we will discontinue amphotericin and start fluconazole.   The patients left eye ptosis is presumed to be due to inflammation of the meninges that is irritating cn 3,4, and 6 and therefore causing the left eye droop. Ocular symptoms will resolve as patients meningeal inflammation decreases. The patients accompanied blurry vision is thought to be due to mild exposure keratosis.   The patient has been having accompanied dysphagia with labial weakness and bilateral labial air leakage. SLP evaluated patient yesterday 8/16 and placed patient on dysphagia 2 diet.  PLAN: 1. Continue amphotericin b 2. Pending final csf result 3. Monitor for nephrotoxicity 4. Continue tear lubricant 5. Will start antiretroviral in 5 weeks after risk of IRIS decreases. Continue azithromycin and bactrim for prophylaxis.     Marland Kitchen amLODipine  10 mg Oral Daily  . [START ON 07/04/2017] azithromycin  1,200 mg Oral Weekly  . chlorhexidine  15 mL Mouth Rinse BID  . dextrose  10 mL Intravenous Q24H  . dextrose  10 mL Intravenous Q24H  . heparin subcutaneous  5,000  Units Subcutaneous Q8H  . lidocaine (PF)  5 mL Intradermal Once  . mouth rinse  15 mL Mouth Rinse q12n4p  . pantoprazole sodium  40 mg Oral Daily  . polyvinyl alcohol  1 drop Both Eyes QHS  . sennosides  5 mL Oral QHS  . sulfamethoxazole-trimethoprim  1 tablet Oral Daily    SUBJECTIVE: Kenneth Rivas was sitting in his bed eating his breakfast. He stated that he was doing well. He denied any sob, chest pain, or headaches.   Review of Systems: ROS as above   No Active Allergies  OBJECTIVE: Vitals:   07/02/17 0516 07/02/17 1343 07/02/17 2239 07/03/17 0552  BP: 129/71 136/80 129/71 128/82  Pulse: 68 74 71 74  Resp: 16 16 20 20   Temp: 98 F (36.7 C) (!) 97.5 F (36.4 C) 98.5 F (36.9 C) 98.2 F (36.8 C)  TempSrc:  Oral Oral Oral  SpO2: 97% 100% 98% 100%  Weight: 110 lb 12.8 oz (50.3 kg)     Height:       Body mass index is 17.88 kg/m.  Physical Exam  Constitutional: He appears malnourished. He appears cachectic. He does not have a sickly appearance.  HENT:  Head: Normocephalic and atraumatic.  Eyes: Conjunctivae are normal.  Cardiovascular: Normal rate, regular rhythm, normal heart sounds and intact distal pulses.   Pulmonary/Chest: Effort normal and breath sounds normal. No respiratory distress. He has no wheezes.  Abdominal: Soft. Bowel sounds are normal. He exhibits no distension. There is no tenderness.  Skin:  picc line was noted to be leaking   Psychiatric: Mood, memory, affect and judgment normal.    Lab Results Lab Results  Component Value Date   WBC 2.1 (L) 07/03/2017   HGB 8.7 (L) 07/03/2017   HCT 26.3 (L) 07/03/2017   MCV 81.4 07/03/2017   PLT 243 07/03/2017    Lab Results  Component Value Date   CREATININE 0.65 07/03/2017   BUN 11 07/03/2017   NA 131 (L) 07/03/2017   K 4.1 07/03/2017   CL 100 (L) 07/03/2017   CO2 25 07/03/2017    Lab Results  Component Value Date   ALT 47 06/27/2017   AST 53 (H) 06/27/2017   ALKPHOS 77 06/27/2017   BILITOT  0.2 (L) 06/27/2017     Microbiology: Recent Results (from the past 240 hour(s))  Culture, fungus without smear     Status: Abnormal (Preliminary result)   Collection Time: 06/24/17  5:35 PM  Result Value Ref Range Status   Specimen Description CSF  Final   Special Requests NONE  Final   Culture CRYPTOCOCCUS NEOFORMANS (A)  Final   Report Status PENDING  Incomplete  CSF culture     Status: None (Preliminary result)   Collection Time: 07/01/17  1:57 PM  Result Value Ref Range Status   Specimen Description CSF  Final   Special Requests NONE  Final   Gram Stain   Final    WBC PRESENT, PREDOMINANTLY MONONUCLEAR ENCAPSULATED YEAST SEEN CYTOSPIN SMEAR CRITICAL RESULT CALLED TO, READ BACK BY AND VERIFIED WITH: K. PRICE, RN AT 1510 ON 07/01/17 BY C. JESSUP, MLT.    Culture YEAST CULTURE REINCUBATED FOR BETTER GROWTH   Final   Report Status PENDING  Incomplete    Lorenso Courier, MD [facility] [phone] pager   [phone] cell 07/03/2017, 11:20 AM

## 2017-07-03 NOTE — Progress Notes (Signed)
Pharmacy Antibiotic Note  Kenneth Rivas is a 56 y.o. male admitted on 06/16/2017 with AIDS and failure to thrive. Pt with acute cryptococcal bloodstream infection also with cavitary pulmonary nodule. Bactrim and azithromycin for PCP/MAC prophylaxis respectively due to CD4 count of 2.  Vancomycin and Zosyn were added for pneumonia during her stay, then narrowed to ceftazidime (stop 8/12) to cover the pseudomonas cultured in the BAL. 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 Flucytosine was stopped secondary to leukopenia. After completion of the amphotericin b he will be transitioned to fluconazole for 8 weeks.  Plan: -Continue Amphotericin B per ID -Monitor renal fx, cultures, Mg & K -Mg 2gm IV x 1 replacement ordered for today. -F/u LP and cultures from 8/15   Height: _0  (167.6 cm) Weight: 110 lb 12.8 oz (50.3 kg) IBW/kg (Calculated) : 63.8  Temp (24hrs), Avg:98.1 F (36.7 C), Min:97.5 F (36.4 C), Max:98.5 F (36.9 C)   Recent Labs Lab 06/29/17 0310 06/30/17 0344 07/01/17 0615 07/02/17 0532 07/03/17 0534  WBC 2.8* 2.5* 2.2* 2.2* 2.1*  CREATININE 0.50* 0.51* 0.54* 0.54* 0.65     Antimicrobials this admission: 8/5 Ceftaz>> 8/12 8/2 AmphB>> [8/?->then fluc for 8wks] 8/2 Flucytosine>>08/08 d/c'ed per ID d/t leukopenia 8/3 Zosyn >>8/5 8/3 Vanc >>8/5 8/4 Bactrim >> 8/4 Azithromycin >>  Microbiology results:  8/15 CSF: pending  8/8 CSF: cryptococcus 8/6 CSF: cryptoc8/3 BAL Fungus Culture: Candida Albicans>>already on ampho 8/3 Pneumocystis smear: negative 8/3 AFB: NEG 8/3 CSF - yeast (cryptococcus neoformans) 8/3 MRSA - NEG 8/3 BAL: yeast; pseudomonas (pansensitive) 8/2 blood cx: crypto neoformans 8/2 sputum: not acceptable for testing 8/2 Crypto: posoccus   Thank you for allowing [place] to participate in this patients care.  Jens Som, PharmD Clinical phone for 07/03/2017 from 7a-3:30p: x  [phone] If after 3:30p, please call main pharmacy at: [phone] 07/03/2017 12:01 PM

## 2017-07-03 NOTE — Progress Notes (Signed)
PT/OT/SLP Cancellation Note   Treatment cancelled today due to patient receiving procedure or test   Will return later today if possible for OT treatment.  Smitty Pluck, OTR/L 07/03/17 8:31 AM

## 2017-07-03 NOTE — Progress Notes (Addendum)
Modified Barium Swallow Progress Note  Patient Details  Name: Kenneth Rivas MRN: [id number] Date of Birth: 16-Dec-1960  Today's Date: 07/03/2017  Modified Barium Swallow completed.  Full report located under Chart Review in the Imaging Section.  Brief recommendations include the following:  Clinical Impression  Pt demonstrates significant improvement in swallow function since prior MBS. Pt now fully alert, endurance much improved. Primary problem is ongoing bilateral labial weakness imapcting oral intake. Pt cannot seal lips to cup, straw or spoon without manual assist. Open oral cavity results in lact of intraoral pressure for adequate bolus containment, formation and anterior-posterior transit. WIth verbal and tactile cues pt can use tongue (strength WNL) to compensate and also can use manual assist from his hand to achieve labial seal. He has not fully learned or carried over this technique independently in MBS session, and will benefit from further training with SLP. Pharyngeal phase is WNL except for premature spillage due to oral deficits causing pharyngeal pooling before the swallow is initaited. No penetration or aspiration occurred despite pts large boluses and occasional posterior lean to facilitate intake. Recommend pt initaite a dys2 diet with thin liquids with f/u from SLP for training. 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 ischemia vs. increased intracranial pressure caused by meningitis per discussion with infectious diease MD.  Difficult to determine what recovery will be. Will continue to use compensatory strategies, doubtful there would be benefit from exercise program given no evidence of benefit of OME in cases of Bell's Palsy which is likely a similar etiology as this.   Swallow Evaluation Recommendations       SLP Diet Recommendations: Dysphagia 2 (Fine chop)  solids;Thin liquid   Liquid Administration via: Cup;Straw   Medication Administration: Whole meds with puree   Supervision: Patient able to self feed;Intermittent supervision to cue for compensatory strategies   Compensations: Slow rate;Lingual sweep for clearance of pocketing;Other (Comment) (manual labial closure with hand)   Postural Changes: Remain semi-upright after after feeds/meals (Comment);Seated upright at 90 degrees   Oral Care Recommendations:  (oral care after meals)   Other Recommendations: Have oral suction available   Harlon Ditty, MA CCC-SLP [phone]  Claudine Mouton 07/03/2017,9:15 AM

## 2017-07-03 NOTE — Progress Notes (Addendum)
Occupational Therapy Treatment Patient Details Name: Kenneth Rivas MRN: [id number] DOB: 1961-10-27 Today's Date: 07/03/2017    History of present illness 56 y.o. 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 Admitted on 7/31 w/ cc: weakness and FTT. Admitting dx: FTT and cachexia d/t AIDs, pulmonary nodules, bedbugs, and HTN. Had CT chest showing cavitary lung lesion in the RML. 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 Intubated 8/3-8/11.   OT comments  Pt continues to progress toward goals.  Completed toileting tasks during session but only performed stand pivot to 3n1 as he felt that he could not make it to bathrooms (urgent bowel movement).  He did require min assist for steadying/balance while completing transfer and during toileting hygiene. Continue to recommend SNF level rehabilitation as pt remains unsteady and at significant risk of falling during ADL participation. Note difficulty with placement and will continue to follow and increase OT frequency to maximize functional gains while admitted in case pt unable to obtain post-acute rehabilitation.   Follow Up Recommendations  SNF    Equipment Recommendations  3 in 1 bedside commode;Tub/shower bench    Recommendations for Other Services      Precautions / Restrictions Precautions Precautions: Fall Restrictions Weight Bearing Restrictions: No       Mobility Bed Mobility Overal bed mobility: Modified Independent                Transfers                      Balance                                           ADL either performed or assessed with clinical judgement   ADL       Grooming: Wash/dry hands;Supervision/safety;Set up;Sitting                   Toilet Transfer: BSC;Minimal assistance (HHA)   Toileting- Clothing Manipulation  and Hygiene: Minimal assistance;Sit to/from stand       Functional mobility during ADLs: Minimal assistance General ADL Comments: Complete stand pivot transfer to 3n1 due to urgency to have bowel movement (did not think he would make it to bathroom in time).     Vision       Perception     Praxis      Cognition Arousal/Alertness: Awake/alert Behavior During Therapy: WFL for tasks assessed/performed Overall Cognitive Status: Within Functional Limits for tasks assessed                                          Exercises     Shoulder Instructions       General Comments      Pertinent Vitals/ Pain       Pain Assessment: No/denies pain  Home Living                                          Prior Functioning/Environment              Frequency  Min 3X/week  Progress Toward Goals  OT Goals(current goals can now be found in the care plan section)  Progress towards OT goals: Progressing toward goals     Plan Discharge plan remains appropriate    Co-evaluation                 AM-PAC PT "6 Clicks" Daily Activity     Outcome Measure   Help from another person eating meals?: Total Help from another person taking care of personal grooming?: A Lot Help from another person toileting, which includes using toliet, bedpan, or urinal?: A Lot Help from another person bathing (including washing, rinsing, drying)?: A Lot Help from another person to put on and taking off regular upper body clothing?: A Lot Help from another person to put on and taking off regular lower body clothing?: A Lot 6 Click Score: 11    End of Session    OT Visit Diagnosis: Muscle weakness (generalized) (M62.81);Low vision, both eyes (H54.2)   Activity Tolerance Patient tolerated treatment well   Patient Left in bed;with call bell/phone within reach;with bed alarm set   Nurse Communication  (notified RN of bowel movement)        Time:  1041-1106 OT Time Calculation (min): 25 min  Charges: OT General Charges $OT Visit: 1 Procedure OT Treatments $Self Care/Home Management : 23-37 mins     Cipriano Mile OTR/L 07/03/2017, 12:36 PM

## 2017-07-03 NOTE — Progress Notes (Signed)
Family Medicine Teaching Service Daily Progress Note Intern Pager: [phone]  Patient name: Kenneth Rivas Medical record number: [id number] Date of birth: 05/16/1961 Age: 56 y.o. Gender: male  Primary Care Provider: Tommy Medal, Lavell Islam, MD Consultants: ID Code Status: FULL  Pt Overview and Major Events to Date:  Admitted on 7/31, CD4 count 2 On 8/1, found to have cryptococcal antigen in serum On 8/3, Transferred to ICU for LP due to risk of intubation/vent, was intubated after transfer On 8/11, Extubated On 8/13, Transferred to [place] and Plan:  Kenneth Rivas a 56 y.o.malepresenting with cachexia and feeling weak and tired x past 4 weeks. PMH is significant for AIDS, hep B, cocaine use, and HTN.   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  On day 14 of amphotericin; flucytosine was abondoned due toxicity.  - ID will determine length of amphotericin B treatment depending on results of CSF fluid culture from 8/15--> current plan: Continue amphotericin B and monitor kidney function until final csf culture results. Will start fluconazole consolidation therapy for 8 weeks if csf sterile. - ID recommended repeat LP: IR was consulted on 8/15 to do the LP- culture pending- WBC present, predominately mononuclear encapsulated yeast seen- normal opening pressure - experiencing left eye ptosis and blurry vision out of left eye; ophthalmologist saw 8/14- 3rd, and mild 4th and 6th nerve palsy will cause double vision- will likley resolve as inflammation resolves. Not concerned about CMV retinitis and thinks vision change is due to exposure keratitis- prescribed tear lubricant QHS.  Will continue to follow.  Pneumonia: Pseudomonas found on sputum culture. Currently on ceftazidime 1g infusion TID day 8.  Also on   azithromycin 1.2 g weekly and Bactrim DS 800-160 mg daily for prophylaxis due to AIDS with CD4 of 2.   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 - Per ID: Will not start antiretroviral therapy due to risk of IRIS. Will consider in 5 weeks. - Diet recommendations: Dysphagia 2 (fine chop);Honey-thick liquid, s/p NG tube - continue to monitor Mag, K, and Phos, replete as needed - does not qualify for SNF placement due to lack of insurance.  Will need arrangement for a different home situation due to his chronic illness and unsafe current situation. - low Mag of 1.5 on 8/15, repleted by pharmacy--> 1.6 on 8/17.  HTN: Hypertensive throughout hospital stay, initially attributed to recent cocaine use. At home on Norvasc 5 mg daily however patient reports as not taking.  Most current BP 128/82, normotensive for 48 hours. -Continue Norvasc 10 mg -Continue Hydral 5 mg with parameters: SBP>170 or DBP>110  -Monitor blood pressures  Hepatitis B: Not currently taking medication and does not follow with Infectious Disease. LFTs with AST 19, ALT 13 and alk phos 58. Normal total bili 0.8 and direct bili 0.2.   -Continue to monitor   H/o drug use and insecure home environment:In ED, UDS +for cocaine and amphetamines. May have contributed to his elevated BP on admission depending on time of ingestion.  -CSW consulted and has been in contact with patient's sister, [name]. Social work has not addressed since patient's extubation.    FEN/GI: Dysphagia 2 (fine chop);Honey-thick liquid, s/p NG tube Prophylaxis: Lovenox   Disposition:Continue on med-surg   Subjective:  [name] was sleeping when I came in the room, but easily awoken. He says that he feels fine this morning and has no complaints, says he feels better since he can eat. He says his eye has not improved  and he was given the drops last night and they seemed to help a small amount at the time.   Objective: Temp:  [97.5 F (36.4 C)-98.5 F (36.9 C)] 98.2 F (36.8 C) (08/17 0552) Pulse Rate:  [71-74] 74 (08/17 0552) Resp:  [16-20] 20 (08/17 0552) BP: (128-136)/(71-82) 128/82 (08/17 0552) SpO2:  [98 %-100 %] 100 % (08/17 0552) Physical Exam: General: ill appearing and frail, awake and alert Cardiovascular: RRR, no MRG Respiratory: no visible increase resp effort, no coughing during exam Abdomen: soft, nontender to palpation Extremities: Frail, nails cracked w/ nail fungus with dirt underneath, visible bony joints  Laboratory:  Recent Labs Lab 07/01/17 0615 07/02/17 0532 07/03/17 0534  WBC 2.2* 2.2* 2.1*  HGB 8.6* 8.7* 8.7*  HCT 26.4* 26.0* 26.3*  PLT 255 254 243    Recent Labs Lab 06/27/17 1330  07/01/17 0615 07/02/17 0532 07/03/17 0534  NA  --   < > 130* 130* 131*  K  --   < > 3.7 4.0 4.1  CL  --   < > 99* 100* 100*  CO2  --   < > '25 25 25  '$ BUN  --   < > '10 11 11  '$ CREATININE  --   < > 0.54* 0.54* 0.65  CALCIUM  --   < > 8.4* 8.4* 8.4*  PROT 7.4  --   --   --   --   BILITOT 0.2*  --   --   --   --   ALKPHOS 77  --   --   --   --   ALT 47  --   --   --   --   AST 53*  --   --   --   --   GLUCOSE  --   < > 110* 108* 94  < > = values in this interval not displayed.  Phos 4.2, Mag 1.6  Imaging/Diagnostic Tests: Ct Chest W Contrast  Result Date: 06/17/2017 CLINICAL DATA:  Shortness of breath. EXAM: CT CHEST WITH CONTRAST TECHNIQUE: Multidetector CT imaging of the chest was performed during intravenous contrast administration. CONTRAST:  59m ISOVUE-300 IOPAMIDOL (ISOVUE-300) INJECTION 61% COMPARISON:  Radiographs of June 16, 2017. FINDINGS: Cardiovascular: Normal heart size. No pericardial effusion. Atherosclerosis of thoracic aorta is noted. No dissection or aneurysm is noted. Mediastinum/Nodes: No enlarged mediastinal, hilar, or axillary lymph nodes. Thyroid gland,  trachea, and esophagus demonstrate no significant findings. Lungs/Pleura: No pneumothorax or pleural effusion is noted. 3 mm nodule is noted in left upper lobe best seen on image number 54 of series 4. 7 mm cavitating abnormality is noted in the right middle lobe best seen on image number 104 series 4. Upper Abdomen: No acute abnormality. Musculoskeletal: No chest wall abnormality. No acute or significant osseous findings. IMPRESSION: 7 mm cavitary lesion is seen in the right middle lobe. It is uncertain if this represents cavitary metastatic lesion or small pulmonary abscess. Clinical correlation is recommended. Follow-up CT scan in 6-8 weeks is recommended to determine stability or resolution. Aortic Atherosclerosis (ICD10-I70.0). Electronically Signed   By: JMarijo Conception M.D.   On: 06/17/2017 12:04   Dg Chest Portable 1 View  Result Date: 06/16/2017 CLINICAL DATA:  Three weeks of weakness, anorexia,  emaciation. History of hepatitis-B, HIV-AIDS. EXAM: PORTABLE CHEST 1 VIEW COMPARISON:  Chest x-ray of June 11, 2017 FINDINGS: The lungs are hyperinflated. There is no focal infiltrate. There are subtle nodules of varying sizes up to 3 mm in diameter in both lungs. Some likely reflect pulmonary vessels on the end but others are more peripheral. The heart and pulmonary vascularity are normal. The mediastinum is normal in width. There is no pleural effusion. IMPRESSION: No classic alveolar pneumonia. No CHF. Tiny pulmonary parenchymal nodules are suspected bilaterally and may reflect infectious or neoplastic processes. Chest CT scanning is recommended. Electronically Signed   By: David  [place] M.D.   On: 06/16/2017 16:45     Damyra Luscher, [place], DO 07/03/2017, 7:20 AM PGY-1, [facility] Intern pager: [phone], text pages welcome

## 2017-07-04 DIAGNOSIS — L03113 Cellulitis of right upper limb: Secondary | ICD-10-CM

## 2017-07-04 DIAGNOSIS — Z0189 Encounter for other specified special examinations: Secondary | ICD-10-CM

## 2017-07-04 LAB — BASIC METABOLIC PANEL
ANION GAP: 6 (ref 5–15)
BUN: 11 mg/dL (ref 6–20)
CHLORIDE: 101 mmol/L (ref 101–111)
CO2: 24 mmol/L (ref 22–32)
Calcium: 8.6 mg/dL — ABNORMAL LOW (ref 8.9–10.3)
Creatinine, Ser: 0.7 mg/dL (ref 0.61–1.24)
GFR calc Af Amer: >60 mL/min (ref >60)
GFR calc non Af Amer: >60 mL/min (ref >60)
GLUCOSE: 127 mg/dL — AB (ref 65–99)
POTASSIUM: 3.2 mmol/L — AB (ref 3.5–5.1)
Sodium: 131 mmol/L — ABNORMAL LOW (ref 135–145)

## 2017-07-04 LAB — CBC WITH DIFFERENTIAL/PLATELET
BASOS ABS: 0 10*3/uL (ref 0.0–0.1)
BASOS PCT: 1 %
EOS PCT: 7 %
Eosinophils Absolute: 0.2 10*3/uL (ref 0.0–0.7)
HCT: 26.6 % — ABNORMAL LOW (ref 39.0–52.0)
Hemoglobin: 8.6 g/dL — ABNORMAL LOW (ref 13.0–17.0)
Lymphocytes Relative: 30 %
Lymphs Abs: 0.7 10*3/uL (ref 0.7–4.0)
MCH: 26.5 pg (ref 26.0–34.0)
MCHC: 32.3 g/dL (ref 30.0–36.0)
MCV: 82.1 fL (ref 78.0–100.0)
MONO ABS: 0.3 10*3/uL (ref 0.1–1.0)
MONOS PCT: 12 %
Neutro Abs: 1.1 10*3/uL — ABNORMAL LOW (ref 1.7–7.7)
Neutrophils Relative %: 50 %
PLATELETS: 259 10*3/uL (ref 150–400)
RBC: 3.24 MIL/uL — ABNORMAL LOW (ref 4.22–5.81)
RDW: 15.2 % (ref 11.5–15.5)
WBC: 2.3 10*3/uL — ABNORMAL LOW (ref 4.0–10.5)

## 2017-07-04 LAB — GLUCOSE, CAPILLARY
GLUCOSE-CAPILLARY: 102 mg/dL — AB (ref 65–99)
Glucose-Capillary: 96 mg/dL (ref 65–99)
Glucose-Capillary: 90 mg/dL (ref 65–99)
Glucose-Capillary: 98 mg/dL (ref 65–99)
Glucose-Capillary: 121 mg/dL — ABNORMAL HIGH (ref 65–99)

## 2017-07-04 LAB — PHOSPHORUS: PHOSPHORUS: 4.1 mg/dL (ref 2.5–4.6)

## 2017-07-04 LAB — MAGNESIUM: MAGNESIUM: 1.5 mg/dL — AB (ref 1.7–2.4)

## 2017-07-04 MED ORDER — MAGNESIUM SULFATE 2 GM/50ML IV SOLN
2.0000 g | Freq: Once | INTRAVENOUS | Status: AC
  Administered 2017-07-04: 2 g via INTRAVENOUS
  Filled 2017-07-04: qty 50

## 2017-07-04 NOTE — Progress Notes (Signed)
  Speech Language Pathology Treatment: Dysphagia  Patient Details Name: Kenneth Rivas MRN: [id number] DOB: 1961/02/03 Today's Date: 07/04/2017 Time: 4037-5436 SLP Time Calculation (min) (ACUTE ONLY): 16 min  Assessment / Plan / Recommendation Clinical Impression  Skilled observation with Dysphagia 2/3 foods and thin liquids utilizing labial closure technique with pt with minimal verbal cueing required initially, then pt was able to use strategy independently; discussed MBS results and pt in agreement that technique assists with eating and drinking during meals; delayed cough noted x1 with liquids via straw after completion of consumption of Dysphagia 2/3 and thin via straw; ST will con't to f/u for diet tolerance and education re: compensatory strategies prn while in acute setting.  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 Admitted on 7/31 w/ cc: weakness and FTT. Admitting dx: FTT and cachexia d/t AIDs, pulmonary nodules, bedbugs, and HTN. Had CT chest showing cavitary lung lesion in the RML. 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 Intubated 8/3-8/11.       SLP Plan  Continue with current plan of care       Recommendations  Diet recommendations: Dysphagia 2 (fine chop);Thin liquid Liquids provided via: Cup;Straw Medication Administration: Crushed with puree Supervision: Patient able to self feed Compensations: Slow rate;Small sips/bites;Other (Comment) (labial closure technique) Postural Changes and/or Swallow Maneuvers: Seated upright 90 degrees                Oral Care Recommendations: Oral care BID Follow up Recommendations: Inpatient Rehab SLP Visit Diagnosis: Dysphagia, oral phase (R13.11) Plan: Continue with current plan of care                       Tressie Stalker, M.S., CCC-SLP 07/04/2017, 12:47  PM

## 2017-07-04 NOTE — Progress Notes (Signed)
Family Medicine Teaching Service Daily Progress Note Intern Pager: [phone]  Patient name: Kenneth Rivas Medical record number: [id number] Date of birth: 1961/04/09 Age: 56 y.o. Gender: male  Primary Care Provider: Tommy Medal, Lavell Islam, MD Consultants: ID Code Status: FULL  Pt Overview and Major Events to Date:  Admitted on 7/31, CD4 count 2 On 8/1, found to have cryptococcal antigen in serum On 8/3, Transferred to ICU for LP due to risk of intubation/vent, was intubated after transfer On 8/11, Extubated On 8/13, Transferred to [place] and Plan: Kenneth Rivas a 56 y.o.malepresenting with cachexia and feeling weak and tired x past 4 weeks. PMH is significant for AIDS, hep B, cocaine use, and HTN.   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  On day 14 of amphotericin; flucytosine was abondoned due toxicity.  - ID will determine length of amphotericin B treatment depending on results of CSF fluid culture from 8/15--> current plan: Continue amphotericin B and monitor kidney function until final csf culture results. Will start fluconazole consolidation therapy for 8 weeks if csf sterile. -  LP on 8/15- culture pending- Negative for 48 hrs- WBC present, predominately mononuclear encapsulated yeast seen- normal opening pressure - experiencing left eye ptosis and blurry vision out of left eye; ophthalmologist saw 8/14- 3rd, and mild 4th and 6th nerve palsy will cause double vision- will likley resolve as inflammation resolves. Not concerned about CMV retinitis and thinks vision change is due to exposure keratitis- prescribed tear lubricant QHS.  Will continue to follow.  Pneumonia: Pseudomonas found on sputum culture. Currently on ceftazidime 1g infusion TID day 8.  Also on  azithromycin 1.2 g weekly and  Bactrim DS 800-160 mg daily for prophylaxis due to AIDS with CD4 of 2.   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 - Per ID: Will not start antiretroviral therapy due to risk of IRIS. Will consider in 5 weeks. - Diet recommendations: Dysphagia 2 (fine chop);Honey-thick liquid, s/p NG tube - continue to monitor Mag, K, and Phos, replete as needed - does not qualify for SNF placement due to lack of insurance.  Will need arrangement for a different home situation due to his chronic illness and unsafe current situation. - low Mag of 1.6 on 8/17- given 2 g--> 1.5 on 8/18, will repeat 2g   HTN: Hypertensive throughout hospital stay, initially attributed to recent cocaine use. At home on Norvasc 5 mg daily however patient reports as not taking.  Most current BP 128/82, normotensive for 48 hours. -Continue Norvasc 10 mg -Continue Hydral 5 mg with parameters: SBP>170 or DBP>110  -Monitor blood pressures  Hepatitis B: Not currently taking medication and does not follow with Infectious Disease. LFTs with AST 19, ALT 13 and alk phos 58. Normal total bili 0.8 and direct bili 0.2.   -Continue to monitor   H/o drug use and insecure home environment:In ED, UDS +for cocaine and amphetamines. May have contributed to his elevated BP on admission depending on time of ingestion.  -CSW consulted and has been in contact with patient's sister, [name]. Social work has not addressed since patient's extubation.    FEN/GI: Dysphagia 2 (fine chop);Honey-thick liquid, s/p NG tube Prophylaxis: Lovenox   Disposition:Continue on med-surg   Subjective:  Kenneth Rivas is  sitting up in bed and is much more talkative today, has a friend in the room. He is feeling better. He says his eye is more comfortable and he thinks the drops are helping. He is annoyed that he gets woken up so much  during the night and morning. He would like to get some sleep.  Objective: Temp:  [98.1 F (36.7 C)-99.2 F (37.3 C)] 98.3 F (36.8 C) (08/18 0432) Pulse Rate:  [72-98] 72 (08/18 0432) Resp:  [16-17] 17 (08/18 0432) BP: (125-156)/(72-93) 132/85 (08/18 1015) SpO2:  [96 %-99 %] 98 % (08/18 0432) Weight:  [111 lb (50.3 kg)] 111 lb (50.3 kg) (08/18 0432) Physical Exam: General: ill appearing and frail, awake and alert Cardiovascular: RRR, no MRG Respiratory: no visible increase resp effort, no coughing during exam Abdomen: soft, nontender to palpation Extremities: Frail, nails cracked w/ nail fungus with dirt underneath, visible bony joints  Laboratory:  Recent Labs Lab 07/02/17 0532 07/03/17 0534 07/04/17 0429  WBC 2.2* 2.1* 2.3*  HGB 8.7* 8.7* 8.6*  HCT 26.0* 26.3* 26.6*  PLT 254 243 259    Recent Labs Lab 06/27/17 1330  07/02/17 0532 07/03/17 0534 07/04/17 1005  NA  --   < > 130* 131* 131*  K  --   < > 4.0 4.1 3.2*  CL  --   < > 100* 100* 101  CO2  --   < > '25 25 24  '$ BUN  --   < > '11 11 11  '$ CREATININE  --   < > 0.54* 0.65 0.70  CALCIUM  --   < > 8.4* 8.4* 8.6*  PROT 7.4  --   --   --   --   BILITOT 0.2*  --   --   --   --   ALKPHOS 77  --   --   --   --   ALT 47  --   --   --   --   AST 53*  --   --   --   --   GLUCOSE  --   < > 108* 94 127*  < > = values in this interval not displayed.  Phos 4.1, Mag 1.5  Imaging/Diagnostic Tests: Ct Chest W Contrast  Result Date: 06/17/2017 CLINICAL DATA:  Shortness of breath. EXAM: CT CHEST WITH CONTRAST TECHNIQUE: Multidetector CT imaging of the chest was performed during intravenous contrast administration. CONTRAST:  84m ISOVUE-300 IOPAMIDOL (ISOVUE-300) INJECTION 61% COMPARISON:  Radiographs of June 16, 2017. FINDINGS: Cardiovascular: Normal heart size. No pericardial effusion. Atherosclerosis of thoracic aorta is noted. No dissection or aneurysm is noted. Mediastinum/Nodes: No enlarged mediastinal, hilar, or axillary  lymph nodes. Thyroid gland, trachea, and esophagus demonstrate no significant findings. Lungs/Pleura: No pneumothorax or pleural effusion is noted. 3 mm nodule is noted in left upper lobe best seen on image number 54 of series 4. 7 mm cavitating abnormality is noted in the right middle lobe best seen on image number 104 series 4. Upper Abdomen: No acute abnormality. Musculoskeletal: No chest wall abnormality. No acute or significant osseous findings. IMPRESSION: 7 mm cavitary lesion is seen in the right middle lobe. It is uncertain if this represents cavitary metastatic lesion or small pulmonary abscess. Clinical correlation is recommended. Follow-up CT scan in 6-8 weeks is recommended to determine stability or resolution. Aortic Atherosclerosis (ICD10-I70.0). Electronically Signed   By: JMarijo Conception M.D.   On: 06/17/2017 12:04   Dg Chest Portable 1 View  Result Date:  06/16/2017 CLINICAL DATA:  Three weeks of weakness, anorexia, emaciation. History of hepatitis-B, HIV-AIDS. EXAM: PORTABLE CHEST 1 VIEW COMPARISON:  Chest x-ray of June 11, 2017 FINDINGS: The lungs are hyperinflated. There is no focal infiltrate. There are subtle nodules of varying sizes up to 3 mm in diameter in both lungs. Some likely reflect pulmonary vessels on the end but others are more peripheral. The heart and pulmonary vascularity are normal. The mediastinum is normal in width. There is no pleural effusion. IMPRESSION: No classic alveolar pneumonia. No CHF. Tiny pulmonary parenchymal nodules are suspected bilaterally and may reflect infectious or neoplastic processes. Chest CT scanning is recommended. Electronically Signed   By: David  [place] M.D.   On: 06/16/2017 16:45     Nicklos Gaxiola, [place], DO 07/04/2017, 12:25 PM PGY-1, [place] Intern pager: [phone], text pages welcome

## 2017-07-05 LAB — CBC WITH DIFFERENTIAL/PLATELET
BASOS ABS: 0 10*3/uL (ref 0.0–0.1)
Basophils Relative: 1 %
EOS ABS: 0.2 10*3/uL (ref 0.0–0.7)
Eosinophils Relative: 10 %
HCT: 26.5 % — ABNORMAL LOW (ref 39.0–52.0)
HEMOGLOBIN: 8.7 g/dL — AB (ref 13.0–17.0)
LYMPHS PCT: 34 %
Lymphs Abs: 0.7 10*3/uL (ref 0.7–4.0)
MCH: 26.5 pg (ref 26.0–34.0)
MCHC: 32.8 g/dL (ref 30.0–36.0)
MCV: 80.8 fL (ref 78.0–100.0)
Monocytes Absolute: 0.3 10*3/uL (ref 0.1–1.0)
Monocytes Relative: 15 %
NEUTROS ABS: 0.9 10*3/uL — AB (ref 1.7–7.7)
NEUTROS PCT: 40 %
Platelets: 267 10*3/uL (ref 150–400)
RBC: 3.28 MIL/uL — ABNORMAL LOW (ref 4.22–5.81)
RDW: 14.7 % (ref 11.5–15.5)
WBC: 2.1 10*3/uL — AB (ref 4.0–10.5)

## 2017-07-05 LAB — BASIC METABOLIC PANEL
Anion gap: 8 (ref 5–15)
BUN: 11 mg/dL (ref 6–20)
CALCIUM: 8.5 mg/dL — AB (ref 8.9–10.3)
CHLORIDE: 100 mmol/L — AB (ref 101–111)
CO2: 23 mmol/L (ref 22–32)
CREATININE: 0.6 mg/dL — AB (ref 0.61–1.24)
GFR calc non Af Amer: >60 mL/min (ref >60)
Glucose, Bld: 83 mg/dL (ref 65–99)
Potassium: 3.8 mmol/L (ref 3.5–5.1)
SODIUM: 131 mmol/L — AB (ref 135–145)

## 2017-07-05 LAB — PHOSPHORUS: PHOSPHORUS: 4.1 mg/dL (ref 2.5–4.6)

## 2017-07-05 LAB — CSF CULTURE

## 2017-07-05 LAB — CSF CULTURE W GRAM STAIN

## 2017-07-05 LAB — MAGNESIUM: MAGNESIUM: 1.8 mg/dL (ref 1.7–2.4)

## 2017-07-05 NOTE — Progress Notes (Signed)
Family Medicine Teaching Service Daily Progress Note Intern Pager: [phone]  Patient name: Kenneth Rivas Medical record number: [id number] Date of birth: Apr 13, 1961 Age: 56 y.o. Gender: male  Primary Care Provider: Tommy Medal, Lavell Islam, MD Consultants: ID Code Status: FULL  Pt Overview and Major Events to Date:  Admitted on 7/31, CD4 count 2 On 8/1, found to have cryptococcal antigen in serum On 8/3, Transferred to ICU for LP due to risk of intubation/vent, was intubated after transfer On 8/11, Extubated On 8/13, Transferred to [facility] and Plan: Kenneth Rivas a 56 y.o.malepresenting with cachexia and feeling weak and tired x past 4 weeks. PMH is significant for AIDS, hep B, cocaine use, and HTN.   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  On day 17 of amphotericin; flucytosine was abondoned due toxicity.  - ID will determine length of amphotericin B treatment depending on results of CSF fluid culture from 8/15--> current plan: Continue amphotericin B and monitor kidney function through weekend; if cx results remain negative will start fluconazole consolidation therapy for 8 weeks on 8/20. -  LP on 8/15- culture pending- Negative x 4 days- WBC present, predominately mononuclear encapsulated yeast seen- normal opening pressure - experiencing left eye ptosis and blurry vision out of left eye; ophthalmologist saw 8/14- 3rd, and mild 4th and 6th nerve palsy will cause double vision- will likley resolve as inflammation resolves. Not concerned about CMV retinitis and thinks vision change is due to exposure keratitis- prescribed tear lubricant QHS.  Will continue to follow.  Pneumonia: Pseudomonas found on sputum culture. S/p 8 day course of ceftazidime 1g infusion TID, d/c'ed 8/12.  Also on azithromycin  1.2 g weekly and Bactrim DS 800-160 mg daily for prophylaxis due to AIDS with CD4 of 2.   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 - Per ID: Will not start antiretroviral therapy due to risk of IRIS. Will consider in 5 weeks. - Diet recommendations: Dysphagia 2 (fine chop);Honey-thick liquid, s/p NG tube - continue to monitor Mag, K, and Phos, replete as needed - does not qualify for SNF placement due to lack of insurance.  Will need arrangement for a different home situation due to his chronic illness and unsafe current situation. - low Mag of 1.6 on 8/17- given 2 g--> 1.5 on 8/18, will repeat 2g   HTN: Normotensive 8/18. Hypertensive throughout hospital stay, initially attributed to recent cocaine use. At home on Norvasc 5 mg daily however patient reports as not taking.  Most current BP 128/82, normotensive for 48 hours. -Continue Norvasc 10 mg -Continue Hydral 5 mg with parameters: SBP>170 or DBP>110  -Monitor blood pressures  Hepatitis B: Not currently taking medication and does not follow with Infectious Disease. LFTs with AST 19, ALT 13 and alk phos 58. Normal total bili 0.8 and direct bili 0.2.   -Continue to monitor   H/o drug use and insecure home environment:In ED, UDS +for cocaine and amphetamines. May have contributed to his elevated BP on admission depending on time of ingestion.  -CSW consulted and has been in contact with patient's sister, [name]. Social work has not addressed since patient's extubation.    FEN/GI: Dysphagia 2 (fine chop);Honey-thick liquid, s/p NG tube Prophylaxis: Lovenox   Disposition:Continue on med-surg  Subjective:  Kenneth Rivas is resting in bed, watching TV. He denies pain. Has occasional cough. Says his vision in his left eye seems less blurry, though his eyelid continues to droop. He reports good  appetite and ate all of his breakfast.   Objective: Temp:  [98.1 F (36.7 C)-99.1 F (37.3 C)] 98.1 F (36.7 C) (08/19 0543) Pulse Rate:  [62-83] 62 (08/19 0543) Resp:  [18] 18 (08/19 0543) BP: (121-132)/(72-85) 127/73 (08/19 0543) SpO2:  [98 %-100 %] 100 % (08/19 0543) Weight:  [107 lb (48.5 kg)] 107 lb (48.5 kg) (08/19 0500) Physical Exam: General: cachetic male, chronically ill appearing Cardiovascular: RRR, no MRG Respiratory: no visible increase resp effort, no crackles appreciated, no coughing during exam Abdomen: soft, nontender to palpation Extremities: decreased bulk, patches of very dry skin  Laboratory:  Recent Labs Lab 07/03/17 0534 07/04/17 0429 07/05/17 0510  WBC 2.1* 2.3* 2.1*  HGB 8.7* 8.6* 8.7*  HCT 26.3* 26.6* 26.5*  PLT 243 259 267    Recent Labs Lab 07/03/17 0534 07/04/17 1005 07/05/17 0510  NA 131* 131* 131*  K 4.1 3.2* 3.8  CL 100* 101 100*  CO2 _0 BUN _1 CREATININE 0.65 0.70 0.60*  CALCIUM 8.4* 8.6* 8.5*  GLUCOSE 94 127* 83    Phos 4.1, Mag 1.8  Imaging/Diagnostic Tests: Ct Chest W Contrast  Result Date: 06/17/2017 CLINICAL DATA:  Shortness of breath. EXAM: CT CHEST WITH CONTRAST TECHNIQUE: Multidetector CT imaging of the chest was performed during intravenous contrast administration. CONTRAST:  58m ISOVUE-300 IOPAMIDOL (ISOVUE-300) INJECTION 61% COMPARISON:  Radiographs of June 16, 2017. FINDINGS: Cardiovascular: Normal heart size. No pericardial effusion. Atherosclerosis of thoracic aorta is noted. No dissection or aneurysm is noted. Mediastinum/Nodes: No enlarged mediastinal, hilar, or axillary lymph nodes. Thyroid gland, trachea, and esophagus demonstrate no significant findings. Lungs/Pleura: No pneumothorax or pleural effusion is noted. 3 mm nodule is noted in left upper lobe best seen on image number 54 of series 4. 7 mm cavitating abnormality is noted in the right middle lobe best seen on image number 104 series 4. Upper  Abdomen: No acute abnormality. Musculoskeletal: No chest wall abnormality. No acute or significant osseous findings. IMPRESSION: 7 mm cavitary lesion is seen in the right middle lobe. It is uncertain if this represents cavitary metastatic lesion or small pulmonary abscess. Clinical correlation is recommended. Follow-up CT scan in 6-8 weeks is recommended to determine stability or resolution. Aortic Atherosclerosis (ICD10-I70.0). Electronically Signed   By: JMarijo Conception M.D.   On: 06/17/2017 12:04   Dg Chest Portable 1 View  Result Date: 06/16/2017 CLINICAL DATA:  Three weeks of weakness, anorexia, emaciation. History of hepatitis-B, HIV-AIDS. EXAM: PORTABLE CHEST 1 VIEW COMPARISON:  Chest x-ray of June 11, 2017 FINDINGS: The lungs are hyperinflated. There is no focal infiltrate. There are subtle nodules of varying sizes up to 3 mm in diameter in both lungs. Some likely reflect pulmonary vessels on the end but others are more peripheral. The heart and pulmonary vascularity are normal. The mediastinum is normal in width. There is no pleural effusion. IMPRESSION: No classic alveolar pneumonia. No CHF. Tiny pulmonary parenchymal nodules are suspected bilaterally and may reflect infectious or neoplastic processes. Chest CT scanning is recommended. Electronically Signed   By: David  JMartiniqueM.D.   On: 06/16/2017 16:45     FRogue Bussing MD 07/05/2017, 9:13 AM PGY-3, C[place]urstonIntern pager: [phone] text pages welcome

## 2017-07-06 LAB — CBC WITH DIFFERENTIAL/PLATELET
BASOS PCT: 1 %
Basophils Absolute: 0 10*3/uL (ref 0.0–0.1)
Eosinophils Absolute: 0.2 10*3/uL (ref 0.0–0.7)
Eosinophils Relative: 9 %
HEMATOCRIT: 27.1 % — AB (ref 39.0–52.0)
HEMOGLOBIN: 8.9 g/dL — AB (ref 13.0–17.0)
LYMPHS PCT: 37 %
Lymphs Abs: 0.8 10*3/uL (ref 0.7–4.0)
MCH: 26.6 pg (ref 26.0–34.0)
MCHC: 32.8 g/dL (ref 30.0–36.0)
MCV: 80.9 fL (ref 78.0–100.0)
MONOS PCT: 18 %
Monocytes Absolute: 0.4 10*3/uL (ref 0.1–1.0)
NEUTROS ABS: 0.7 10*3/uL — AB (ref 1.7–7.7)
Neutrophils Relative %: 35 %
Platelets: 267 10*3/uL (ref 150–400)
RBC: 3.35 MIL/uL — ABNORMAL LOW (ref 4.22–5.81)
RDW: 14.7 % (ref 11.5–15.5)
WBC: 2.1 10*3/uL — ABNORMAL LOW (ref 4.0–10.5)

## 2017-07-06 LAB — RENAL FUNCTION PANEL
ANION GAP: 6 (ref 5–15)
Albumin: 2.3 g/dL — ABNORMAL LOW (ref 3.5–5.0)
BUN: 10 mg/dL (ref 6–20)
CO2: 23 mmol/L (ref 22–32)
Calcium: 8.4 mg/dL — ABNORMAL LOW (ref 8.9–10.3)
Chloride: 103 mmol/L (ref 101–111)
Creatinine, Ser: 0.57 mg/dL — ABNORMAL LOW (ref 0.61–1.24)
GFR calc Af Amer: >60 mL/min (ref >60)
GFR calc non Af Amer: >60 mL/min (ref >60)
GLUCOSE: 81 mg/dL (ref 65–99)
POTASSIUM: 3.4 mmol/L — AB (ref 3.5–5.1)
Phosphorus: 4 mg/dL (ref 2.5–4.6)
Sodium: 132 mmol/L — ABNORMAL LOW (ref 135–145)

## 2017-07-06 LAB — MAGNESIUM: Magnesium: 1.5 mg/dL — ABNORMAL LOW (ref 1.7–2.4)

## 2017-07-06 MED ORDER — FLUCONAZOLE 100 MG PO TABS
800.0000 mg | ORAL_TABLET | Freq: Every day | ORAL | Status: DC
  Administered 2017-07-06 – 2017-07-14 (×9): 800 mg via ORAL
  Filled 2017-07-06 (×9): qty 8

## 2017-07-06 MED ORDER — ENSURE ENLIVE PO LIQD
237.0000 mL | Freq: Three times a day (TID) | ORAL | Status: DC
  Administered 2017-07-06 – 2017-07-09 (×4): 237 mL via ORAL

## 2017-07-06 MED ORDER — ADULT MULTIVITAMIN W/MINERALS CH
1.0000 | ORAL_TABLET | Freq: Every day | ORAL | Status: DC
  Administered 2017-07-06 – 2017-07-14 (×9): 1 via ORAL
  Filled 2017-07-06 (×9): qty 1

## 2017-07-06 MED ORDER — MAGNESIUM SULFATE 2 GM/50ML IV SOLN
2.0000 g | Freq: Once | INTRAVENOUS | Status: AC
  Administered 2017-07-06: 2 g via INTRAVENOUS
  Filled 2017-07-06: qty 50

## 2017-07-06 MED ORDER — POTASSIUM CHLORIDE CRYS ER 20 MEQ PO TBCR
20.0000 meq | EXTENDED_RELEASE_TABLET | Freq: Once | ORAL | Status: AC
  Administered 2017-07-06: 20 meq via ORAL
  Filled 2017-07-06: qty 1

## 2017-07-06 NOTE — Progress Notes (Signed)
Patient ID: Kenneth Rivas, male   DOB: 1961/03/04, 56 y.o.   MRN: [id number]          [facility]  Date of Admission:  06/16/2017           Day 19 amphotericin ASSESSMENT: He is showing some signs of clinical improvement but his latest CSF culture remains positive for cryptococcus. Since he had not been able to tolerate flucytosine along with amphotericin I will add high-dose fluconazole and continue amphotericin for now. I recommend a repeat lumbar puncture on Wednesday, 07/08/2017. I would like to see negative CSF cultures before changing to oral fluconazole alone.  PLAN: 1. Continue amphotericin 2. Start high-dose oral fluconazole 3. Recommend repeat lumbar puncture in 48 hours  Principal Problem:   Cryptococcosis (HCC) Active Problems:   Weakness   Cachexia associated with AIDS (HCC)   Abnormal chest x-ray   Protein-calorie malnutrition, severe   AIDS (acquired immune deficiency syndrome) (HCC)   Cavitary pneumonia   Refeeding syndrome   Hypophosphatemia   Cryptococcal meningitis (HCC)   Lung abscess (HCC)   Cellulitis of right hand   Ptosis, left eyelid   Acute respiratory failure with hypoxia (HCC)   Pressure injury of skin   Blurry vision, left eye   Meningitis   Encounter for imaging study to confirm nasogastric (NG) tube placement   . amLODipine  10 mg Oral Daily  . azithromycin  1,200 mg Oral Weekly  . chlorhexidine  15 mL Mouth Rinse BID  . dextrose  10 mL Intravenous Q24H  . dextrose  10 mL Intravenous Q24H  . fluconazole  800 mg Oral Daily  . heparin subcutaneous  5,000 Units Subcutaneous Q8H  . lidocaine (PF)  5 mL Intradermal Once  . mouth rinse  15 mL Mouth Rinse q12n4p  . pantoprazole sodium  40 mg Oral Daily  . polyvinyl alcohol  1 drop Both Eyes QHS  . sennosides  5 mL Oral QHS  . sulfamethoxazole-trimethoprim  1 tablet Oral Daily    SUBJECTIVE: He denies headache. He states that he is feeling about the  same.  Review of Systems: Review of Systems  Constitutional: Negative for chills, diaphoresis and fever.  Gastrointestinal: Negative for abdominal pain, diarrhea, nausea and vomiting.  Neurological: Negative for headaches.    No Active Allergies  OBJECTIVE: Vitals:   07/05/17 1519 07/05/17 2245 07/06/17 0601 07/06/17 1100  BP: 118/72 122/70 137/83 126/86  Pulse: 68 63 62   Resp: 16 18 18    Temp: 98 F (36.7 C) 98.6 F (37 C) 98.1 F (36.7 C)   TempSrc:   Oral   SpO2: 98% 100% 100%   Weight:   111 lb 3.2 oz (50.4 kg)   Height:       Body mass index is 17.95 kg/m.  Physical Exam  Constitutional: He is oriented to person, place, and time.  Alert and in no distress.  HENT:  No change in left ptosis.  Cardiovascular: Normal rate and regular rhythm.   No murmur heard. Pulmonary/Chest: Effort normal and breath sounds normal.  Neurological: He is alert and oriented to person, place, and time.  Psychiatric: Mood and affect normal.    Lab Results Lab Results  Component Value Date   WBC 2.1 (L) 07/06/2017   HGB 8.9 (L) 07/06/2017   HCT 27.1 (L) 07/06/2017   MCV 80.9 07/06/2017   PLT 267 07/06/2017    Lab Results  Component Value Date   CREATININE 0.57 (L) 07/06/2017  BUN 10 07/06/2017   NA 132 (L) 07/06/2017   K 3.4 (L) 07/06/2017   CL 103 07/06/2017   CO2 23 07/06/2017    Lab Results  Component Value Date   ALT 47 06/27/2017   AST 53 (H) 06/27/2017   ALKPHOS 77 06/27/2017   BILITOT 0.2 (L) 06/27/2017     Microbiology: Recent Results (from the past 240 hour(s))  CSF culture     Status: Abnormal   Collection Time: 07/01/17  1:57 PM  Result Value Ref Range Status   Specimen Description CSF  Final   Special Requests NONE  Final   Gram Stain   Final    WBC PRESENT, PREDOMINANTLY MONONUCLEAR ENCAPSULATED YEAST SEEN CYTOSPIN SMEAR CRITICAL RESULT CALLED TO, READ BACK BY AND VERIFIED WITH: K. PRICE, RN AT 1510 ON 07/01/17 BY C. JESSUP, MLT.    Culture  CRYPTOCOCCUS NEOFORMANS (A)  Final   Report Status 07/05/2017 FINAL  Final    [name], MD [facility] [phone] pager   [phone] cell 07/06/2017, 12:06 PM

## 2017-07-06 NOTE — Progress Notes (Signed)
Family Medicine Teaching Service Daily Progress Note Intern Pager: [phone]  Patient name: Kenneth Rivas Medical record number: [id number] Date of birth: [birth year] Age: 56 y.o. Gender: male  Primary Care Provider: Tommy Medal, Lavell Islam, MD Consultants: ID Code Status: FULL  Pt Overview and Major Events to Date:  Admitted on 7/31, CD4 count 2 On 8/1, found to have cryptococcal antigen in serum On 8/3, Transferred to ICU for LP due to risk of intubation/vent, was intubated after transfer On 8/11, Extubated On 8/13, Transferred to [place] and Plan: Kenneth Rivas a 56 y.o.malepresenting with cachexia and feeling weak and tired x past 4 weeks. PMH is significant for AIDS, hep B, cocaine use, and HTN.   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  On day 19 of amphotericin; flucytosine was abondoned due toxicity.  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 neoformans. -  LP on 8/15- culture pending- Negative x 4 days- WBC present, predominately mononuclear encapsulated yeast seen- normal opening pressure - experiencing left eye ptosis and blurry vision out of left eye; ophthalmologist saw 8/14- 3rd, and mild 4th and 6th nerve palsy will cause double vision- will likley resolve as inflammation resolves. Not concerned about CMV retinitis and thinks vision change is due to exposure keratitis- prescribed tear lubricant QHS.  Will continue to follow.  Pneumonia: Pseudomonas found on sputum  culture. S/p 8 day course of ceftazidime 1g infusion TID, d/c'ed 8/12.  Also on azithromycin 1.2 g weekly and Bactrim DS 800-160 mg daily for prophylaxis due to AIDS with CD4 of 2.   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 - Per ID: Will not start antiretroviral therapy due to risk of IRIS. Will consider in 5 weeks. - Diet recommendations: Dysphagia 2 (fine chop);Honey-thick liquid, s/p NG tube - continue to monitor Mag, K, and Phos, replete as needed - does not qualify for SNF placement due to lack of insurance.  Will need arrangement for a different home situation due to his chronic illness and unsafe current situation. - low Mag of 1.6 on 8/17- given 4 g--> 1.8 on 8/19--> 1.5 on 8/20, will repeat 2g dose - K 3.4- will give 20 mEq of Kdur   HTN: Normotensive 8/20. Hypertensive throughout hospital stay, initially attributed to recent cocaine use. At home on Norvasc 5 mg daily however patient reports as not taking.  Most current BP 137/83, normotensive for 48 hours. -Continue Norvasc 10 mg -Continue Hydral 5 mg with parameters: SBP>170 or DBP>110  -Monitor blood pressures  Hepatitis B: Not currently taking medication and does not follow with Infectious Disease. LFTs with AST 19, ALT 13 and alk phos 58. Normal total bili 0.8 and direct bili 0.2.   -Continue to monitor   H/o drug use and insecure home environment:In ED, UDS +for cocaine and amphetamines. May have contributed to his elevated BP on admission depending on time of ingestion.  -CSW consulted and has been in contact with patient's sister, Kenneth Rivas.  Social work has not addressed since patient's extubation.    FEN/GI: Dysphagia 2 (fine chop);Honey-thick liquid, s/p NG tube Prophylaxis: Lovenox   Disposition:Continue on med-surg   Subjective:  [name] is resting in bed, sleeping but  easily woken up. Says he is feeling better, he just is trying to get some rest. His says his eye is not hurting him but he still cannot see. Denies any chest pain, SOB.   Objective: Temp:  [98 F (36.7 C)-98.6 F (37 C)] 98.1 F (36.7 C) (08/20 0601) Pulse Rate:  [62-68] 62 (08/20 0601) Resp:  [16-18] 18 (08/20 0601) BP: (118-142)/(70-83) 137/83 (08/20 0601) SpO2:  [98 %-100 %] 100 % (08/20 0601) Weight:  [111 lb 3.2 oz (50.4 kg)] 111 lb 3.2 oz (50.4 kg) (08/20 0601) Physical Exam: General: cachetic male, chronically ill appearing Cardiovascular: RRR, no MRG Respiratory: no visible increase resp effort, no crackles appreciated, no coughing during exam Abdomen: soft, nontender to palpation Extremities: decreased bulk, patches of very dry skin  Laboratory:  Recent Labs Lab 07/04/17 0429 07/05/17 0510 07/06/17 0324  WBC 2.3* 2.1* 2.1*  HGB 8.6* 8.7* 8.9*  HCT 26.6* 26.5* 27.1*  PLT 259 267 267    Recent Labs Lab 07/04/17 1005 07/05/17 0510 07/06/17 0324  NA 131* 131* 132*  K 3.2* 3.8 3.4*  CL 101 100* 103  CO2 _0 BUN _1 CREATININE 0.70 0.60* 0.57*  CALCIUM 8.6* 8.5* 8.4*  GLUCOSE 127* 83 81    Phos 4.1, Mag 1.8  Imaging/Diagnostic Tests: Ct Chest W Contrast  Result Date: 06/17/2017 CLINICAL DATA:  Shortness of breath. EXAM: CT CHEST WITH CONTRAST TECHNIQUE: Multidetector CT imaging of the chest was performed during intravenous contrast administration. CONTRAST:  30m ISOVUE-300 IOPAMIDOL (ISOVUE-300) INJECTION 61% COMPARISON:  Radiographs of June 16, 2017. FINDINGS: Cardiovascular: Normal heart size. No pericardial effusion. Atherosclerosis of thoracic aorta is noted. No dissection or aneurysm is noted. Mediastinum/Nodes: No enlarged mediastinal, hilar, or axillary lymph nodes. Thyroid gland, trachea, and esophagus demonstrate no significant findings. Lungs/Pleura: No pneumothorax or pleural effusion is noted. 3 mm nodule is noted in left upper lobe best  seen on image number 54 of series 4. 7 mm cavitating abnormality is noted in the right middle lobe best seen on image number 104 series 4. Upper Abdomen: No acute abnormality. Musculoskeletal: No chest wall abnormality. No acute or significant osseous findings. IMPRESSION: 7 mm cavitary lesion is seen in the right middle lobe. It is uncertain if this represents cavitary metastatic lesion or small pulmonary abscess. Clinical correlation is recommended. Follow-up CT scan in 6-8 weeks is recommended to determine stability or resolution. Aortic Atherosclerosis (ICD10-I70.0). Electronically Signed   By: JMarijo Conception M.D.   On: 06/17/2017 12:04   Dg Chest Portable 1 View  Result Date: 06/16/2017 CLINICAL DATA:  Three weeks of weakness, anorexia, emaciation. History of hepatitis-B, HIV-AIDS. EXAM: PORTABLE CHEST 1 VIEW COMPARISON:  Chest x-ray of June 11, 2017 FINDINGS: The lungs are hyperinflated. There is no focal infiltrate. There are subtle nodules of varying sizes up to 3 mm in diameter in both lungs. Some likely reflect pulmonary vessels on the end but others are more peripheral. The heart and pulmonary vascularity are normal. The mediastinum is normal in width. There is no pleural effusion. IMPRESSION: No classic alveolar pneumonia. No CHF. Tiny pulmonary parenchymal nodules are suspected bilaterally and may reflect infectious or neoplastic processes. Chest CT scanning is recommended. Electronically Signed   By: DShanon Brow  [place] M.D.   On: 06/16/2017 16:45     Arvon Schreiner, [place], DO 07/06/2017, 9:29 AM PGY-1, [place] Intern pager: [phone], text pages welcome

## 2017-07-06 NOTE — Progress Notes (Signed)
Occupational Therapy Treatment Patient Details Name: Kenneth Rivas MRN: [id number] DOB: [birth year] Today's Date: 07/06/2017    History of present illness 56 y.o. 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 Admitted on 7/31 w/ cc: weakness and FTT. Admitting dx: FTT and cachexia d/t AIDs, pulmonary nodules, bedbugs, and HTN. Had CT chest showing cavitary lung lesion in the RML. 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 Intubated 8/3-8/11.   OT comments  Pt progressing towards goals. Completed room level functional mobility at RW level with overall MinA, intermittently required ModA as Pt with periods of increased unsteadiness. Pt completed toileting and standing grooming ADLs, requires ModA for toilet transfer and toileting, Min steady assist during static standing at sink. Will continue to follow to progress Pt's safety and independence with ADLs and functional mobility. POC remains appropriate.    Follow Up Recommendations  SNF    Equipment Recommendations  3 in 1 bedside commode;Tub/shower bench          Precautions / Restrictions Precautions Precautions: Fall Restrictions Weight Bearing Restrictions: No       Mobility Bed Mobility Overal bed mobility: Needs Assistance Bed Mobility: Supine to Sit;Sit to Supine     Supine to sit: Supervision Sit to supine: Supervision   General bed mobility comments: supervision for safety   Transfers Overall transfer level: Needs assistance Equipment used: Rolling walker (2 wheeled) Transfers: Sit to/from Stand Sit to Stand: Min assist         General transfer comment: cues for hand placement; MinA to rise, MinA with intermittent ModA during ambulation to bathroom as Pt demonstrating unsteady gait    Balance Overall balance assessment: Needs assistance Sitting-balance support: No  upper extremity supported;Feet supported Sitting balance-Leahy Scale: Fair Sitting balance - Comments: static sitting EOB    Standing balance support: Bilateral upper extremity supported Standing balance-Leahy Scale: Poor Standing balance comment: Reliant on BUE support and external assistance.                           ADL either performed or assessed with clinical judgement   ADL Overall ADL's : Needs assistance/impaired Eating/Feeding: Set up;Sitting   Grooming: Wash/dry hands;Minimal assistance;Standing Grooming Details (indicate cue type and reason): assist for standing balance, Pt supporting self using forearms on sink                  Toilet Transfer: Moderate assistance;Ambulation;Comfort height toilet;Grab bars;RW Statistician Details (indicate cue type and reason): requires VC's for safety on sitting as Pt tends to sit early, assist to rise and lower from toilet  Toileting- Clothing Manipulation and Hygiene: Minimal assistance;Sit to/from stand Toileting - Clothing Manipulation Details (indicate cue type and reason): assist for perihygiene after BM      Functional mobility during ADLs: Rolling walker;Moderate assistance General ADL Comments: Pt completes room level functional mobility using RW to complete toileting and grooming ADLs, requires MinA with intermittent ModA during mobility as Pt with unsteady gait     Vision Baseline Vision/History:  (L ptosis )                Cognition Arousal/Alertness: Awake/alert Behavior During Therapy: WFL for tasks assessed/performed Overall Cognitive Status: Within Functional Limits for tasks assessed  Pertinent Vitals/ Pain       Pain Assessment: Faces Faces Pain Scale: No hurt                                                          Frequency  Min 3X/week        Progress Toward  Goals  OT Goals(current goals can now be found in the care plan section)  Progress towards OT goals: Progressing toward goals  Acute Rehab OT Goals Patient Stated Goal: to do yardwork OT Goal Formulation: With patient Time For Goal Achievement: 07/13/17 Potential to Achieve Goals: Good  Plan Discharge plan remains appropriate                     AM-PAC PT "6 Clicks" Daily Activity     Outcome Measure   Help from another person eating meals?: A Lot Help from another person taking care of personal grooming?: A Little Help from another person toileting, which includes using toliet, bedpan, or urinal?: A Lot Help from another person bathing (including washing, rinsing, drying)?: A Lot Help from another person to put on and taking off regular upper body clothing?: A Lot Help from another person to put on and taking off regular lower body clothing?: A Lot 6 Click Score: 13    End of Session Equipment Utilized During Treatment: Rolling walker;Gait belt  OT Visit Diagnosis: Muscle weakness (generalized) (M62.81);Low vision, both eyes (H54.2)   Activity Tolerance Patient tolerated treatment well   Patient Left in bed;with call bell/phone within reach;with bed alarm set;with family/visitor present   Nurse Communication Mobility status        Time: 1350-1418 OT Time Calculation (min): 28 min  Charges: OT General Charges $OT Visit: 1 Procedure OT Treatments $Self Care/Home Management : 23-37 mins  Marcy Siren, OT Pager [phone] 07/06/2017    [name] 07/06/2017, 3:42 PM

## 2017-07-06 NOTE — Progress Notes (Signed)
CSW received consult regarding patient's housing situation and substance use. Patient was pleasant. Patient reported that he lives in a rooming house and will be returning there at discharge. He reported that he does not need resources at this time, but will accept the list just in case. CSW also asked patient if he would like resources for substance use, but he declined. Financial counseling is assisting him in applying for Medicaid/Disability. He stated he appreciated CSW's time.   CSW signing off as no further intervention is needed at this time.  Osborne Casco Hansini Clodfelter LCSWA [phone]

## 2017-07-06 NOTE — Progress Notes (Signed)
SLP Cancellation Note  Patient Details Name: Kenneth Rivas MRN: [id number] DOB: [birth year]   Cancelled treatment:       Reason Eval/Treat Not Completed: Patient declined, no reason specified (politely declined), resting at this time.  [name] MA, CCC-SLP [phone]    [name] Meryl 07/06/2017, 3:32 PM

## 2017-07-06 NOTE — Progress Notes (Signed)
Nutrition Follow-up  DOCUMENTATION CODES:   Underweight, Severe malnutrition in context of chronic illness  INTERVENTION:   -Ensure Enlive po TID, each supplement provides 350 kcal and 20 grams of protein -MVI daily   NUTRITION DIAGNOSIS:   Malnutrition (Severe) related to chronic illness (HIV) as evidenced by severe depletion of body fat, severe depletion of muscle mass, energy intake < 75% for > or equal to 1 month.  Ongoing  GOAL:   Patient will meet greater than or equal to 90% of their needs  Progressing  MONITOR:   PO intake, Supplement acceptance, Diet advancement, Labs, Skin, I & O's  REASON FOR ASSESSMENT:   Consult Enteral/tube feeding initiation and management  ASSESSMENT:   Pt admitted with bed bugs, cachexia and feeling weak and tired x past 4 weeks, presumed cryptoccoal meningitis. PMH is significant for AIDS, hep B, cocaine use, and HTN.    8/11- extubated 8/12- s/p BSE and MBSS, recommending continued NPO status  8/15- s/p LP 8/16- NGT removed (coiled in throat), s/p BSE- advanced to dysphagia 2 diet with honey thick liquids 8/17- s/p MBSS- advanced to dysphagia 2 diet with thin liquids  Pt sleeping soundly at time of visit. RD did not disturb.   Pt tolerating current diet well. Noted meal completion 50-100%. RD will add nutritional supplements related to malnutrition and increased nutrition needs related to AIDS, stage II pressure injury, and meningitis.   Labs reviewed: Na: 132, K: 3.2 (on PO supplementation), Mg: 1.5 (on IV supplementation).   Diet Order:  DIET DYS 2 Room service appropriate? Yes; Fluid consistency: Thin  Skin:  Wound (see comment) (stage II sacrum)  Last BM:  07/05/17  Height:   Ht Readings from Last 1 Encounters:  06/19/17 5\' 6"  (1.676 m)    Weight:   Wt Readings from Last 1 Encounters:  07/06/17 111 lb 3.2 oz (50.4 kg)    Ideal Body Weight:  50 kg  BMI:  Body mass index is 17.95 kg/m.  Estimated Nutritional  Needs:   Kcal:  2000-2200  Protein:  100-115 grams  Fluid:  > 2.0 L  EDUCATION NEEDS:   Education needs addressed  Adaya Garmany A. Mayford Knife, RD, LDN, CDE Pager: [phone] After hours Pager: [phone]

## 2017-07-07 LAB — BASIC METABOLIC PANEL
ANION GAP: 6 (ref 5–15)
BUN: 10 mg/dL (ref 6–20)
CALCIUM: 8.5 mg/dL — AB (ref 8.9–10.3)
CO2: 23 mmol/L (ref 22–32)
Chloride: 104 mmol/L (ref 101–111)
Creatinine, Ser: 0.59 mg/dL — ABNORMAL LOW (ref 0.61–1.24)
Glucose, Bld: 84 mg/dL (ref 65–99)
POTASSIUM: 3.4 mmol/L — AB (ref 3.5–5.1)
SODIUM: 133 mmol/L — AB (ref 135–145)

## 2017-07-07 LAB — CBC WITH DIFFERENTIAL/PLATELET
BASOS ABS: 0 10*3/uL (ref 0.0–0.1)
Basophils Relative: 1 %
EOS ABS: 0.2 10*3/uL (ref 0.0–0.7)
EOS PCT: 8 %
HCT: 27.8 % — ABNORMAL LOW (ref 39.0–52.0)
Hemoglobin: 9.2 g/dL — ABNORMAL LOW (ref 13.0–17.0)
Lymphocytes Relative: 24 %
Lymphs Abs: 0.5 10*3/uL — ABNORMAL LOW (ref 0.7–4.0)
MCH: 26.7 pg (ref 26.0–34.0)
MCHC: 33.1 g/dL (ref 30.0–36.0)
MCV: 80.6 fL (ref 78.0–100.0)
MONO ABS: 0.5 10*3/uL (ref 0.1–1.0)
Monocytes Relative: 23 %
NEUTROS PCT: 44 %
Neutro Abs: 1 10*3/uL — ABNORMAL LOW (ref 1.7–7.7)
PLATELETS: 262 10*3/uL (ref 150–400)
RBC: 3.45 MIL/uL — AB (ref 4.22–5.81)
RDW: 15.1 % (ref 11.5–15.5)
WBC: 2.2 10*3/uL — AB (ref 4.0–10.5)

## 2017-07-07 LAB — PHOSPHORUS: PHOSPHORUS: 4 mg/dL (ref 2.5–4.6)

## 2017-07-07 LAB — MAGNESIUM: MAGNESIUM: 1.7 mg/dL (ref 1.7–2.4)

## 2017-07-07 MED ORDER — POTASSIUM CHLORIDE CRYS ER 20 MEQ PO TBCR
40.0000 meq | EXTENDED_RELEASE_TABLET | Freq: Once | ORAL | Status: AC
  Administered 2017-07-07: 40 meq via ORAL
  Filled 2017-07-07: qty 2

## 2017-07-07 NOTE — Progress Notes (Signed)
Physical Therapy Treatment Patient Details Name: Kenneth Rivas MRN: [id number] DOB: [birth year] Today's Date: 07/07/2017    History of Present Illness 56 y.o. 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 Admitted on 7/31 w/ cc: weakness and FTT. Admitting dx: FTT and cachexia d/t AIDs, pulmonary nodules, bedbugs, and HTN. Had CT chest showing cavitary lung lesion in the RML. 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 Intubated 8/3-8/11.    PT Comments    Progressing slowly, limited by fatigue.   Planned to complete a set of exercises for strengthening, but pt declined and only would ambulate and to a specified distance at that.  I stressed that I was happy that he did get up, but that I would like for him to progress past the same point he usually went to.    Follow Up Recommendations  SNF;Supervision/Assistance - 24 hour     Equipment Recommendations  Other (comment) (TBD next venue)    Recommendations for Other Services       Precautions / Restrictions Precautions Precautions: Fall    Mobility  Bed Mobility Overal bed mobility: Needs Assistance Bed Mobility: Supine to Sit;Sit to Supine     Supine to sit: Supervision Sit to supine: Supervision   General bed mobility comments: use of the rail, but smooth otherwise  Transfers Overall transfer level: Needs assistance   Transfers: Sit to/from Stand Sit to Stand: Min assist         General transfer comment: cues for hand placement and stability assist due to pt falling off posteriorly  Ambulation/Gait Ambulation/Gait assistance: Min assist Ambulation Distance (Feet): 90 Feet Assistive device: Rolling walker (2 wheeled) Gait Pattern/deviations: Step-through pattern;Scissoring;Drifts right/left Gait velocity: slower Gait velocity interpretation: Below normal speed for  age/gender General Gait Details: still moderately unsteady overall needing min assist overall, but with short periods where pt can be guarded only   Science writer    Modified Rankin (Stroke Patients Only)       Balance Overall balance assessment: Needs assistance Sitting-balance support: No upper extremity supported;Feet supported Sitting balance-Leahy Scale: Fair     Standing balance support: Bilateral upper extremity supported Standing balance-Leahy Scale: Poor Standing balance comment: Reliant on BUE support and external assistance.                            Cognition Arousal/Alertness: Awake/alert Behavior During Therapy: WFL for tasks assessed/performed Overall Cognitive Status: Within Functional Limits for tasks assessed                                        Exercises      General Comments General comments (skin integrity, edema, etc.): pt deferred exercises, bed or standing.  pt eager to walk, but won't push himself past what is familiar.      Pertinent Vitals/Pain Pain Assessment: Faces Faces Pain Scale: No hurt    Home Living                      Prior Function            PT Goals (current goals can now be found in the care plan section) Acute Rehab PT Goals Patient  Stated Goal: to do yardwork PT Goal Formulation: With patient Time For Goal Achievement: 07/08/17 Potential to Achieve Goals: Fair Progress towards PT goals: Progressing toward goals;Goals met and updated - see care plan    Frequency    Min 3X/week      PT Plan Current plan remains appropriate    Co-evaluation              AM-PAC PT "6 Clicks" Daily Activity  Outcome Measure  Difficulty turning over in bed (including adjusting bedclothes, sheets and blankets)?: A Little Difficulty moving from lying on back to sitting on the side of the bed? : Unable Difficulty sitting down on and standing up from a  chair with arms (e.g., wheelchair, bedside commode, etc,.)?: Unable Help needed moving to and from a bed to chair (including a wheelchair)?: A Little Help needed walking in hospital room?: A Little Help needed climbing 3-5 steps with a railing? : A Lot 6 Click Score: 13    End of Session   Activity Tolerance: Patient limited by fatigue Patient left: in bed;with call bell/phone within reach;with bed alarm set Nurse Communication: Mobility status PT Visit Diagnosis: Difficulty in walking, not elsewhere classified (R26.2);Muscle weakness (generalized) (M62.81);Other abnormalities of gait and mobility (R26.89)     Time: 1700-1714 PT Time Calculation (min) (ACUTE ONLY): 14 min  Charges:  $Gait Training: 8-22 mins                    G Codes:       14-Jul-2017  Donnella Sham, PT [phone]  (pager)   Kenneth Rivas 07-14-2017, 5:26 PM

## 2017-07-07 NOTE — Progress Notes (Signed)
PT Cancellation Note  Patient Details Name: Sade Lachat MRN: [id number] DOB: August 29, 1961   Cancelled Treatment:    Reason Eval/Treat Not Completed: Patient declined, no reason specified. I don't feel like doing anything now.  Will return later as able. 07/07/2017  Loxley Bing, PT [phone]  (pager)    Eliseo Gum Tarrin Lebow 07/07/2017, 1:06 PM

## 2017-07-07 NOTE — Progress Notes (Signed)
Family Medicine Teaching Service Daily Progress Note Intern Pager: [phone]  Patient name: Kenneth Rivas Medical record number: [id number] Date of birth: 1961-02-02 Age: 56 y.o. Gender: male  Primary Care Provider: Tommy Rivas, Kenneth Islam, MD Consultants: ID Code Status: FULL  Pt Overview and Major Events to Date:  Admitted on 7/31, CD4 count 2 On 8/1, found to have cryptococcal antigen in serum On 8/3, Transferred to ICU for LP due to risk of intubation/vent, was intubated after transfer On 8/11, Extubated On 8/13, Transferred to [place] and Plan: Kenneth Rivas a 56 y.o.malepresenting with cachexia and feeling weak and tired x past 4 weeks. PMH is significant for AIDS, hep B, cocaine use, and HTN.   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  On day 20 of amphotericin.  - ID will determine length of amphotericin B treatment LP cx results from 8/15 reveal Crytpococcus neoformans 8/19- - Id recommends start fluconazole consolidation therapy for 8 weeks  - experiencing left eye ptosis and blurry vision out of left eye; ophthalmologist saw 8/14- 3rd, and mild 4th and 6th nerve palsy will cause double vision- Will continue to follow. Improving with drops.  Pneumonia: Pseudomonas on sputum culture. S/p 8 day course of ceftazidime 1g infusion TID, d/c'ed 8/12.  On azithromycin 1.2 g weekly and Bactrim DS 800-160 mg daily for prophylaxis due to AIDS with CD4 of 2.   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 - Per ID: Will not start antiretroviral therapy due to risk of IRIS. Will consider in 5 weeks. - Diet recommendations: Dysphagia 2 (fine chop);Honey-thick liquid - continue to monitor Mag, K, and Phos, replete as needed - does not qualify  for SNF placement  - low Mag of 1.7 on 8/21, s/p 2 g, continue to closely monitor - K 3.4- will give 40 mEq of Kdur   HTN: Normotensive 8/21. -Continue Norvasc 10 mg -Continue Hydral 5 mg with parameters: SBP>170 or DBP>110  -Monitor blood pressures  Hepatitis B: Not currently taking medication and does not follow with Infectious Disease. LFTs with AST 19, ALT 13 and alk phos 58. Normal total bili 0.8 and direct bili 0.2.   -Continue to monitor   H/o drug use and insecure home environment:In ED, UDS +for cocaine and amphetamines. May have contributed to his elevated BP on admission depending on time of ingestion.  -CSW states pt isnt interested in help.   FEN/GI: Dysphagia 2 (fine chop);Honey-thick liquid, s/p NG tube Prophylaxis: Lovenox   Disposition:Continue on med-surg   Subjective:  Kenneth Rivas is resting in bed visiting with his friend. Says he is feeling better, he just is trying to get some rest. His says his eye is not hurting him but he still cannot see. Denies any chest pain, SOB.   Objective: Temp:  [98 F (36.7 C)-98.4 F (36.9 C)] 98.3 F (36.8 C) (08/21 0555) Pulse Rate:  [69-100] 74 (08/21 0555) Resp:  [16] 16 (08/21 0555) BP: (121-138)/(72-89) 126/75 (08/21 0555) SpO2:  [99 %-100 %] 100 % (08/21 0555) Weight:  [102 lb 12.8 oz (46.6 kg)] 102 lb 12.8 oz (46.6 kg) (08/21 0227) Physical Exam: General: cachetic male, chronically ill appearing Cardiovascular: RRR, no MRG Respiratory: no visible increase resp effort, no crackles appreciated, no coughing during exam Abdomen: soft, nontender to palpation Extremities: decreased bulk,  patches of very dry skin  Laboratory:  Recent Labs Lab 07/05/17 0510 07/06/17 0324 07/07/17 0534  WBC 2.1* 2.1* 2.2*  HGB 8.7* 8.9* 9.2*  HCT 26.5* 27.1* 27.8*  PLT 267 267 262    Recent Labs Lab 07/05/17 0510 07/06/17 0324 07/07/17 0534  NA 131* 132* 133*  K 3.8 3.4* 3.4*  CL 100* 103 104  CO2 '23 23 23   '$ BUN '11 10 10  '$ CREATININE 0.60* 0.57* 0.59*  CALCIUM 8.5* 8.4* 8.5*  GLUCOSE 83 81 84   Phos 4.0, Mag 1.7  Imaging/Diagnostic Tests: Ct Chest W Contrast  Result Date: 06/17/2017 CLINICAL DATA:  Shortness of breath. EXAM: CT CHEST WITH CONTRAST TECHNIQUE: Multidetector CT imaging of the chest was performed during intravenous contrast administration. CONTRAST:  65m ISOVUE-300 IOPAMIDOL (ISOVUE-300) INJECTION 61% COMPARISON:  Radiographs of June 16, 2017. FINDINGS: Cardiovascular: Normal heart size. No pericardial effusion. Atherosclerosis of thoracic aorta is noted. No dissection or aneurysm is noted. Mediastinum/Nodes: No enlarged mediastinal, hilar, or axillary lymph nodes. Thyroid gland, trachea, and esophagus demonstrate no significant findings. Lungs/Pleura: No pneumothorax or pleural effusion is noted. 3 mm nodule is noted in left upper lobe best seen on image number 54 of series 4. 7 mm cavitating abnormality is noted in the right middle lobe best seen on image number 104 series 4. Upper Abdomen: No acute abnormality. Musculoskeletal: No chest wall abnormality. No acute or significant osseous findings. IMPRESSION: 7 mm cavitary lesion is seen in the right middle lobe. It is uncertain if this represents cavitary metastatic lesion or small pulmonary abscess. Clinical correlation is recommended. Follow-up CT scan in 6-8 weeks is recommended to determine stability or resolution. Aortic Atherosclerosis (ICD10-I70.0). Electronically Signed   By: JMarijo Conception M.D.   On: 06/17/2017 12:04   Dg Chest Portable 1 View  Result Date: 06/16/2017 CLINICAL DATA:  Three weeks of weakness, anorexia, emaciation. History of hepatitis-B, HIV-AIDS. EXAM: PORTABLE CHEST 1 VIEW COMPARISON:  Chest x-ray of June 11, 2017 FINDINGS: The lungs are hyperinflated. There is no focal infiltrate. There are subtle nodules of varying sizes up to 3 mm in diameter in both lungs. Some likely reflect pulmonary vessels on the end but  others are more peripheral. The heart and pulmonary vascularity are normal. The mediastinum is normal in width. There is no pleural effusion. IMPRESSION: No classic alveolar pneumonia. No CHF. Tiny pulmonary parenchymal nodules are suspected bilaterally and may reflect infectious or neoplastic processes. Chest CT scanning is recommended. Electronically Signed   By: David  JMartiniqueM.D.   On: 06/16/2017 16:45     Sreya Froio, JMartinique DO 07/07/2017, 9:49 AM PGY-1, [place] Spring[place] pager: [phone] text pages welcome

## 2017-07-07 NOTE — Progress Notes (Signed)
Pharmacy Antibiotic Note  Kenneth Rivas is a 56 y.o. male admitted on 06/16/2017 with AIDS and failure to thrive. Pt with acute cryptococcal bloodstream and CSF infection also with cavitary pulmonary nodule. Bactrim and azithromycin for PCP/MAC prophylaxis respectively due to CD4 count of 2.  Kenneth Rivas has cryptococcal meningitis requiring amphotericin B and flucytosine treatment, however Flucytosine was stopped secondary to leukopenia.   CSF continues to grow CRYPTOCOCCUS NEOFORMANS. Continuing ampho for now and starting IV fluconazole. Plan for new CSF cultures in 48 hours and continuing IV fluconazole and ampho until CSF negative. Magnesium 1.7, potassium 3.4  Plan: -Continue Amphotericin B per ID -Monitor renal fx, cultures, Mg & K -KCl replacement given today -F/u new LP and cultures -Fluconazole started per MD   Height: _0  (167.6 cm) Weight: 102 lb 12.8 oz (46.6 kg) IBW/kg (Calculated) : 63.8  Temp (24hrs), Avg:98.2 F (36.8 C), Min:98 F (36.7 C), Max:98.4 F (36.9 C)   Recent Labs Lab 07/03/17 0534 07/04/17 0429 07/04/17 1005 07/05/17 0510 07/06/17 0324 07/07/17 0534  WBC 2.1* 2.3*  --  2.1* 2.1* 2.2*  CREATININE 0.65  --  0.70 0.60* 0.57* 0.59*     Antimicrobials this admission: 8/5 Ceftaz>> 8/12 8/2 AmphB>> 8/2 Flucytosine>>08/08 d/c'ed per ID d/t leukopenia 8/3 Zosyn >>8/5 8/3 Vanc >>8/5 8/4 Bactrim >> 8/4 Azithromycin >> 8/20 Fluconazole >>  Microbiology results:  8/15 CSF: CRYPTOCOCCUS NEOFORMANS  8/8 CSF: cryptococcus 8/6 CSF: cryptoc8/3 BAL Fungus Culture: Candida Albicans>>already on ampho 8/3 Pneumocystis smear: negative 8/3 AFB: NEG 8/3 CSF - yeast (cryptococcus neoformans) 8/3 MRSA - NEG 8/3 BAL: yeast; pseudomonas (pansensitive) 8/2 blood cx: crypto neoformans 8/2 sputum: not acceptable for testing 8/2 Crypto: posoccus   Thank you for allowing [place] to participate in this patients care.  Jens Som, PharmD Clinical phone for  07/07/2017 from 7a-3:30p: [phone] If after 3:30p, please call main pharmacy at: [phone] 07/07/2017 11:36 AM

## 2017-07-07 NOTE — Progress Notes (Signed)
  Speech Language Pathology Treatment: Dysphagia  Patient Details Name: Kenneth Rivas MRN: [id number] DOB: Apr 14, 1961 Today's Date: 07/07/2017 Time: 3419-6222 SLP Time Calculation (min) (ACUTE ONLY): 10 min  Assessment / Plan / Recommendation Clinical Impression  Pt was eating an icee upon SLP arrival while lying reclined in bed. SLP provided Min A for repositioning to increase safety with swallowing. During eat and drinking via straw, pt used his tongue and the corner of his lips to achieve adequate seal around his spoon/straw. He reports good tolerance of his meals without fatigue, although he has not been eating much due to lack of appetite. Recommend to continue with current diet for now.   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 Admitted on 7/31 w/ cc: weakness and FTT. Admitting dx: FTT and cachexia d/t AIDs, pulmonary nodules, bedbugs, and HTN. Had CT chest showing cavitary lung lesion in the RML. 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 Intubated 8/3-8/11.       SLP Plan  Continue with current plan of care       Recommendations  Diet recommendations: Dysphagia 2 (fine chop);Thin liquid Liquids provided via: Cup;Straw Medication Administration: Crushed with puree Supervision: Patient able to self feed Compensations: Slow rate;Small sips/bites;Other (Comment) (use tongue/hands to assist with seal around cup/spoon/straw) Postural Changes and/or Swallow Maneuvers: Seated upright 90 degrees                Oral Care Recommendations: Oral care BID Follow up Recommendations: Skilled Nursing facility SLP Visit Diagnosis: Dysphagia, oral phase (R13.11) Plan: Continue with current plan of care       GO                Maxcine Ham 07/07/2017, 11:39 AM  Maxcine Ham, M.A.  CCC-SLP [phone]

## 2017-07-08 ENCOUNTER — Inpatient Hospital Stay (HOSPITAL_COMMUNITY): Payer: Medicaid Other

## 2017-07-08 LAB — CBC WITH DIFFERENTIAL/PLATELET
BASOS ABS: 0.1 10*3/uL (ref 0.0–0.1)
Basophils Relative: 3 %
EOS ABS: 0.2 10*3/uL (ref 0.0–0.7)
Eosinophils Relative: 8 %
HCT: 27.1 % — ABNORMAL LOW (ref 39.0–52.0)
HEMOGLOBIN: 8.9 g/dL — AB (ref 13.0–17.0)
LYMPHS PCT: 37 %
Lymphs Abs: 0.7 10*3/uL (ref 0.7–4.0)
MCH: 26.9 pg (ref 26.0–34.0)
MCHC: 32.8 g/dL (ref 30.0–36.0)
MCV: 81.9 fL (ref 78.0–100.0)
MONO ABS: 0.3 10*3/uL (ref 0.1–1.0)
Monocytes Relative: 17 %
NEUTROS PCT: 35 %
Neutro Abs: 0.6 10*3/uL — ABNORMAL LOW (ref 1.7–7.7)
PLATELETS: 250 10*3/uL (ref 150–400)
RBC: 3.31 MIL/uL — AB (ref 4.22–5.81)
RDW: 15.5 % (ref 11.5–15.5)
WBC: 1.9 10*3/uL — AB (ref 4.0–10.5)

## 2017-07-08 LAB — BASIC METABOLIC PANEL
Anion gap: 4 — ABNORMAL LOW (ref 5–15)
BUN: 7 mg/dL (ref 6–20)
CO2: 26 mmol/L (ref 22–32)
Calcium: 8.7 mg/dL — ABNORMAL LOW (ref 8.9–10.3)
Chloride: 104 mmol/L (ref 101–111)
Creatinine, Ser: 0.62 mg/dL (ref 0.61–1.24)
Glucose, Bld: 87 mg/dL (ref 65–99)
POTASSIUM: 3.7 mmol/L (ref 3.5–5.1)
SODIUM: 134 mmol/L — AB (ref 135–145)

## 2017-07-08 LAB — MAGNESIUM: Magnesium: 1.4 mg/dL — ABNORMAL LOW (ref 1.7–2.4)

## 2017-07-08 LAB — CRYPTOCOCCAL ANTIGEN, CSF
CRYPTO AG: POSITIVE — AB
CRYPTOCOCCAL AG TITER: 640 — AB

## 2017-07-08 LAB — PHOSPHORUS: PHOSPHORUS: 4 mg/dL (ref 2.5–4.6)

## 2017-07-08 LAB — CSF CELL COUNT WITH DIFFERENTIAL
EOS CSF: 0 % (ref 0–1)
LYMPHS CSF: 90 % — AB (ref 40–80)
MONOCYTE-MACROPHAGE-SPINAL FLUID: 10 % — AB (ref 15–45)
RBC Count, CSF: 4 /mm3 — ABNORMAL HIGH
SEGMENTED NEUTROPHILS-CSF: 0 % (ref 0–6)
TUBE #: 3
WBC, CSF: 40 /mm3 (ref 0–5)

## 2017-07-08 LAB — GLUCOSE, CSF: GLUCOSE CSF: 40 mg/dL (ref 40–70)

## 2017-07-08 LAB — PROTEIN, CSF: TOTAL PROTEIN, CSF: 58 mg/dL — AB (ref 15–45)

## 2017-07-08 MED ORDER — LIDOCAINE HCL (PF) 1 % IJ SOLN
INTRAMUSCULAR | Status: AC
  Filled 2017-07-08: qty 5

## 2017-07-08 MED ORDER — LIDOCAINE HCL (PF) 1 % IJ SOLN
5.0000 mL | Freq: Once | INTRAMUSCULAR | Status: DC
  Filled 2017-07-08: qty 5

## 2017-07-08 MED ORDER — LIDOCAINE HCL (PF) 1 % IJ SOLN
5.0000 mL | Freq: Once | INTRAMUSCULAR | Status: AC
  Administered 2017-07-08: 2 mL via INTRADERMAL
  Filled 2017-07-08: qty 5

## 2017-07-08 MED ORDER — MAGNESIUM SULFATE 2 GM/50ML IV SOLN
2.0000 g | Freq: Once | INTRAVENOUS | Status: AC
  Administered 2017-07-08: 2 g via INTRAVENOUS
  Filled 2017-07-08: qty 50

## 2017-07-08 NOTE — Progress Notes (Signed)
Occupational Therapy Treatment Patient Details Name: Kenneth Rivas MRN: [id number] DOB: 1961/04/30 Today's Date: 07/08/2017    History of present illness 56 y.o. 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 Admitted on 7/31 w/ cc: weakness and FTT. Admitting dx: FTT and cachexia d/t AIDs, pulmonary nodules, bedbugs, and HTN. Had CT chest showing cavitary lung lesion in the RML. 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 Intubated 8/3-8/11.   OT comments  Pt progressing towards OT goals this session. Declined OOB this session but agreeable to sitting EOB for HEP for BUE. Pt able to perform and verbally acknowledged the benefits of strengthening UE for assist not only in ADL but for mobility and using the RW to assist. Pt continues to benefit from skilled OT in the acute setting.  Follow Up Recommendations  SNF    Equipment Recommendations  3 in 1 bedside commode;Tub/shower bench    Recommendations for Other Services      Precautions / Restrictions Precautions Precautions: Fall Restrictions Weight Bearing Restrictions: No       Mobility Bed Mobility Overal bed mobility: Needs Assistance Bed Mobility: Supine to Sit;Sit to Supine     Supine to sit: Supervision Sit to supine: Supervision   General bed mobility comments: use of the rail, but smooth otherwise  Transfers                 General transfer comment: Pt declined OOB transfer this session    Balance Overall balance assessment: Needs assistance Sitting-balance support: No upper extremity supported;Feet supported Sitting balance-Leahy Scale: Fair Sitting balance - Comments: static sitting EOB                                    ADL either performed or assessed with clinical judgement   ADL                                          General ADL Comments: Session focused on HEP for BUE this session     Vision       Perception     Praxis      Cognition Arousal/Alertness: Awake/alert Behavior During Therapy: WFL for tasks assessed/performed Overall Cognitive Status: Within Functional Limits for tasks assessed                                          Exercises Exercises: General Upper Extremity General Exercises - Upper Extremity Shoulder Flexion: AROM;Strengthening;Both;10 reps;Seated Elbow Flexion: AROM;Both;10 reps;Seated;Strengthening;Theraband Theraband Level (Elbow Flexion): Level 1 (Yellow) Elbow Extension: AROM;Both;10 reps;Seated Digit Composite Flexion: AROM;Both;10 reps;Seated Composite Extension: AROM;Both;10 reps;Seated Chair Push Up: AROM;5 reps;Seated   Shoulder Instructions       General Comments      Pertinent Vitals/ Pain       Pain Assessment: Faces Faces Pain Scale: Hurts a little bit Pain Location: stiffness in BUE Pain Descriptors / Indicators: Aching;Tightness;Tiring Pain Intervention(s): Monitored during session  Home Living  Prior Functioning/Environment              Frequency  Min 3X/week        Progress Toward Goals  OT Goals(current goals can now be found in the care plan section)  Progress towards OT goals: Progressing toward goals  Acute Rehab OT Goals Patient Stated Goal: to get around better and have more energy OT Goal Formulation: With patient Time For Goal Achievement: 07/13/17 Potential to Achieve Goals: Good  Plan Discharge plan remains appropriate    Co-evaluation                 AM-PAC PT "6 Clicks" Daily Activity     Outcome Measure   Help from another person eating meals?: A Little Help from another person taking care of personal grooming?: A Little Help from another person toileting, which includes using toliet, bedpan, or urinal?: A Lot Help from  another person bathing (including washing, rinsing, drying)?: A Lot Help from another person to put on and taking off regular upper body clothing?: A Lot Help from another person to put on and taking off regular lower body clothing?: A Lot 6 Click Score: 14    End of Session Equipment Utilized During Treatment: Other (comment) (theraband)  OT Visit Diagnosis: Muscle weakness (generalized) (M62.81);Low vision, both eyes (H54.2)   Activity Tolerance Patient tolerated treatment well   Patient Left in bed;with call bell/phone within reach;with bed alarm set   Nurse Communication Mobility status        Time: 0086-7619 OT Time Calculation (min): 18 min  Charges: OT General Charges $OT Visit: 1 Procedure OT Treatments $Therapeutic Exercise: 8-22 mins  Sherryl Manges OTR/L [phone]  [name] 07/08/2017, 4:18 PM

## 2017-07-08 NOTE — Progress Notes (Signed)
Patient ID: Kenneth Rivas, male   DOB: Feb 07, 1961, 56 y.o.   MRN: [id number]          [facility] for Infectious Disease    Date of Admission:  06/16/2017   Day 21 amphotericin        Day 3 fluconazole  Kenneth Rivas is clinically stable without any evidence of new complications of his cryptococcal meningitis. He underwent repeat lumbar puncture today. His opening pressure was in the high normal range at 24 cm of water. 9 mL cyst CSF were removed and is closing pressure was down to 9 cm of water. Yeast forms were seen on stain. His white blood cell count has gone up to 40 which may actually be a good sign of a more robust immune response. His protein is down and his glucose has normalized. His cryptococcal antigen remains positive but the titer is decreasing. If his CSF fungal cultures are finalized as negative I will stop amphotericin and continue fluconazole alone. I will follow with you.         [name], MD [facility] Infectious Disease [facility] [phone] pager   [phone] cell 07/08/2017, 3:34 PM

## 2017-07-08 NOTE — Progress Notes (Signed)
Family Medicine Teaching Service Daily Progress Note Intern Pager: [phone]  Patient name: Kenneth Rivas Medical record number: [id number] Date of birth: 1961-03-25 Age: 56 y.o. Gender: male  Primary Care Provider: Tommy Medal, Lavell Islam, MD Consultants: ID Code Status: FULL  Pt Overview and Major Events to Date:  Admitted on 7/31, CD4 count 2 On 8/1, found to have cryptococcal antigen in serum On 8/3, Transferred to ICU for LP due to risk of intubation/vent, was intubated after transfer On 8/11, Extubated On 8/13, Transferred to [place] and Plan: Kenneth Rivas a 56 y.o.malepresenting with cachexia and feeling weak and tired x past 4 weeks. PMH is significant for AIDS, hep B, cocaine use, and HTN.   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 On day 21 of amphotericin.  - ID will determine length of amphotericin B treatment LP cx results from 8/15 reveal Crytpococcus neoformans 8/19. Repeat LP to be done today by [place]. - Id recommends start fluconazole consolidation therapy for 8 weeks  - experiencing left eye ptosis and blurry vision out of left eye; ophthalmologist saw 8/14- 3rd, and mild 4th and 6th nerve palsy will cause double vision- Improving with drops.  Pneumonia: Pseudomonas on sputum culture. S/p 8 day course of ceftazidime 1g infusion TID, d/c'ed 8/12. On azithromycin 1.2 g weekly and Bactrim DS 800-160 mg daily for prophylaxis due to AIDS with CD4 of 2.   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 - Per ID: Will not start antiretroviral therapy due to risk of IRIS. Will consider in 5 weeks. - Diet recommendations: Dysphagia 2 (fine chop); Honey-thick liquid - continue to monitor Mag, K, and Phos, replete as needed - low Mag of  1.4 on 8/22, will give 2 g - K 3.7 s/p 40 mEq Kdur 8/21  HTN: Normotensive 8/21. -Continue Norvasc 10 mg -Continue Hydral 5 mg with parameters: SBP>170 or DBP>110  -Monitor blood pressures  Hepatitis B: Not currently taking medication and does not follow with Infectious Disease. LFTs with AST 19, ALT 13 and alk phos 58. Normal total bili 0.8 and direct bili 0.2.   -Continue to monitor   H/o drug use and insecure home environment:In ED, UDS +for cocaine and amphetamines. May have contributed to his elevated BP on admission depending on time of ingestion.  -CSW states pt isnt interested in help.   FEN/GI: Dysphagia 2 (fine chop);Honey-thick liquid, s/p NG tube Prophylaxis: Lovenox   Disposition:Continue on med-surg   Subjective:  [name] is resting in bed. He says he's more tired today. He is feeling good though. No complaints. Is aware he is getting an LP today. He just wants some coffee.   Objective: Temp:  [97.9 F (36.6 C)-98 F (36.7 C)] 98 F (36.7 C) (08/22 0531) Pulse Rate:  [64-73] 64 (08/22 0531) Resp:  [14-17] 17 (08/22 0531) BP: (111-120)/(67-81) 116/78 (08/22 0531) SpO2:  [99 %-100 %] 100 % (08/22 0531) Weight:  [112 lb 11.2 oz (51.1 kg)] 112 lb 11.2 oz (51.1 kg) (08/22 0205) Physical Exam: General: cachetic male, chronically ill appearing Cardiovascular: RRR, no MRG Respiratory: no visible increase resp effort, no crackles appreciated, no coughing Abdomen: soft, nontender to palpation Extremities: decreased bulk, patches of very dry skin  Laboratory:  Recent Labs Lab 07/06/17 0324 07/07/17 0534 07/08/17 0509  WBC 2.1* 2.2*  1.9*  HGB 8.9* 9.2* 8.9*  HCT 27.1* 27.8* 27.1*  PLT 267 262 250    Recent Labs Lab 07/06/17 0324 07/07/17 0534 07/08/17 0509  NA 132* 133* 134*  K 3.4* 3.4* 3.7  CL 103 104 104  CO2 '23 23 26  '$ BUN '10 10 7  '$ CREATININE 0.57* 0.59* 0.62  CALCIUM 8.4* 8.5* 8.7*  GLUCOSE 81 84 87   Phos 4.0, Mag  1.4  Imaging/Diagnostic Tests: Ct Chest W Contrast  Result Date: 06/17/2017 CLINICAL DATA:  Shortness of breath. EXAM: CT CHEST WITH CONTRAST TECHNIQUE: Multidetector CT imaging of the chest was performed during intravenous contrast administration. CONTRAST:  63m ISOVUE-300 IOPAMIDOL (ISOVUE-300) INJECTION 61% COMPARISON:  Radiographs of June 16, 2017. FINDINGS: Cardiovascular: Normal heart size. No pericardial effusion. Atherosclerosis of thoracic aorta is noted. No dissection or aneurysm is noted. Mediastinum/Nodes: No enlarged mediastinal, hilar, or axillary lymph nodes. Thyroid gland, trachea, and esophagus demonstrate no significant findings. Lungs/Pleura: No pneumothorax or pleural effusion is noted. 3 mm nodule is noted in left upper lobe best seen on image number 54 of series 4. 7 mm cavitating abnormality is noted in the right middle lobe best seen on image number 104 series 4. Upper Abdomen: No acute abnormality. Musculoskeletal: No chest wall abnormality. No acute or significant osseous findings. IMPRESSION: 7 mm cavitary lesion is seen in the right middle lobe. It is uncertain if this represents cavitary metastatic lesion or small pulmonary abscess. Clinical correlation is recommended. Follow-up CT scan in 6-8 weeks is recommended to determine stability or resolution. Aortic Atherosclerosis (ICD10-I70.0). Electronically Signed   By: JMarijo Conception M.D.   On: 06/17/2017 12:04   Dg Chest Portable 1 View  Result Date: 06/16/2017 CLINICAL DATA:  Three weeks of weakness, anorexia, emaciation. History of hepatitis-B, HIV-AIDS. EXAM: PORTABLE CHEST 1 VIEW COMPARISON:  Chest x-ray of June 11, 2017 FINDINGS: The lungs are hyperinflated. There is no focal infiltrate. There are subtle nodules of varying sizes up to 3 mm in diameter in both lungs. Some likely reflect pulmonary vessels on the end but others are more peripheral. The heart and pulmonary vascularity are normal. The mediastinum is normal in  width. There is no pleural effusion. IMPRESSION: No classic alveolar pneumonia. No CHF. Tiny pulmonary parenchymal nodules are suspected bilaterally and may reflect infectious or neoplastic processes. Chest CT scanning is recommended. Electronically Signed   By: David  JMartiniqueM.D.   On: 06/16/2017 16:45     Kassey Laforest, JMartinique DO 07/08/2017, 9:32 AM PGY-1, CLake KatrineIntern pager: [phone] text pages welcome

## 2017-07-08 NOTE — Progress Notes (Signed)
CRITICAL VALUE ALERT  Critical Value:  Gram Stain + Yeast  Date & Time Notied:  07/08/17 1441  Provider Notified: MD paged  Orders Received/Actions taken: N/A

## 2017-07-08 NOTE — Progress Notes (Signed)
CRITICAL VALUE ALERT  Critical Value:  WBC from CSP 40  Date & Time Notied:  07/08/17 1440  Provider Notified: MD paged  Orders Received/Actions taken: N/A

## 2017-07-09 LAB — CBC WITH DIFFERENTIAL/PLATELET
BASOS PCT: 2 %
Basophils Absolute: 0 10*3/uL (ref 0.0–0.1)
Eosinophils Absolute: 0.2 10*3/uL (ref 0.0–0.7)
Eosinophils Relative: 10 %
HEMATOCRIT: 28.3 % — AB (ref 39.0–52.0)
HEMOGLOBIN: 9.3 g/dL — AB (ref 13.0–17.0)
LYMPHS PCT: 34 %
Lymphs Abs: 0.7 10*3/uL (ref 0.7–4.0)
MCH: 26.5 pg (ref 26.0–34.0)
MCHC: 32.9 g/dL (ref 30.0–36.0)
MCV: 80.6 fL (ref 78.0–100.0)
Monocytes Absolute: 0.4 10*3/uL (ref 0.1–1.0)
Monocytes Relative: 19 %
NEUTROS PCT: 35 %
Neutro Abs: 0.8 10*3/uL — ABNORMAL LOW (ref 1.7–7.7)
Platelets: 246 10*3/uL (ref 150–400)
RBC: 3.51 MIL/uL — AB (ref 4.22–5.81)
RDW: 15 % (ref 11.5–15.5)
WBC MORPHOLOGY: INCREASED
WBC: 2.1 10*3/uL — ABNORMAL LOW (ref 4.0–10.5)

## 2017-07-09 LAB — FUNGUS CULTURE WITH STAIN

## 2017-07-09 LAB — BASIC METABOLIC PANEL
ANION GAP: 6 (ref 5–15)
BUN: 9 mg/dL (ref 6–20)
CHLORIDE: 101 mmol/L (ref 101–111)
CO2: 23 mmol/L (ref 22–32)
CREATININE: 0.61 mg/dL (ref 0.61–1.24)
Calcium: 8.4 mg/dL — ABNORMAL LOW (ref 8.9–10.3)
GFR calc non Af Amer: >60 mL/min (ref >60)
Glucose, Bld: 84 mg/dL (ref 65–99)
POTASSIUM: 3 mmol/L — AB (ref 3.5–5.1)
SODIUM: 130 mmol/L — AB (ref 135–145)

## 2017-07-09 LAB — PATHOLOGIST SMEAR REVIEW

## 2017-07-09 LAB — FUNGAL ORGANISM REFLEX

## 2017-07-09 LAB — FUNGUS CULTURE RESULT

## 2017-07-09 LAB — PHOSPHORUS: Phosphorus: 4.1 mg/dL (ref 2.5–4.6)

## 2017-07-09 LAB — MAGNESIUM: Magnesium: 1.5 mg/dL — ABNORMAL LOW (ref 1.7–2.4)

## 2017-07-09 MED ORDER — MAGNESIUM SULFATE 2 GM/50ML IV SOLN
2.0000 g | Freq: Once | INTRAVENOUS | Status: AC
  Administered 2017-07-09: 2 g via INTRAVENOUS
  Filled 2017-07-09: qty 50

## 2017-07-09 MED ORDER — POTASSIUM CHLORIDE CRYS ER 20 MEQ PO TBCR: 30.0000 meq | EXTENDED_RELEASE_TABLET | Freq: Two times a day (BID) | ORAL | Status: DC

## 2017-07-09 MED ORDER — ENSURE ENLIVE PO LIQD
237.0000 mL | Freq: Two times a day (BID) | ORAL | Status: DC
  Administered 2017-07-10 – 2017-07-14 (×5): 237 mL via ORAL

## 2017-07-09 MED ORDER — MAGNESIUM SULFATE 2 GM/50ML IV SOLN
2.0000 g | Freq: Once | INTRAVENOUS | Status: DC
  Filled 2017-07-09: qty 50

## 2017-07-09 MED ORDER — POTASSIUM CHLORIDE CRYS ER 20 MEQ PO TBCR
40.0000 meq | EXTENDED_RELEASE_TABLET | ORAL | Status: AC
  Administered 2017-07-09 (×2): 40 meq via ORAL
  Filled 2017-07-09 (×2): qty 2

## 2017-07-09 MED ORDER — PRO-STAT SUGAR FREE PO LIQD
30.0000 mL | Freq: Every day | ORAL | Status: DC
  Administered 2017-07-09 – 2017-07-10 (×2): 30 mL via ORAL
  Filled 2017-07-09 (×4): qty 30

## 2017-07-09 NOTE — Progress Notes (Signed)
Family Medicine Teaching Service Daily Progress Note Intern Pager: [phone]  Patient name: Kenneth Rivas Medical record number: [id number] Date of birth: [birth year] Age: 56 y.o. Gender: male  Primary Care Provider: Tommy Medal, Lavell Islam, MD Consultants: ID Code Status: FULL  Pt Overview and Major Events to Date:  Admitted on 7/31, CD4 count 2 On 8/1, found to have cryptococcal antigen in serum On 8/3, Transferred to ICU for LP due to risk of intubation/vent, was intubated after transfer On 8/11, Extubated On 8/13, Transferred to [place] and Plan: Kenneth Rivas a 56 y.o.malepresenting with cachexia and feeling weak and tired x past 4 weeks. PMH is significant for AIDS, hep B, cocaine use, and HTN.   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 On day 21 of amphotericin.  - Repeat LP, open pressure of 9cm, 40 WBC, low protein, nl glucose. Cryptococcal antigen down trending, cultures pending -Per ID, cont amphotericine B and fluconazole    Left eye ptosis: stable. experiencing left eye ptosis and blurry vision out of left eye; ophthalmologist saw 8/14- 3rd, and mild 4th and 6th nerve palsy will cause double vision- Improving with drops. -cont to monitor   Pneumonia: Pseudomonas on sputum culture. S/p 8 day course of ceftazidime 1g infusion TID, d/c'ed 8/12. On azithromycin 1.2 g weekly and Bactrim DS 800-160 mg daily for prophylaxis due to AIDS with CD4 of 2.   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 - Per ID: Will not start antiretroviral therapy due to risk of IRIS. Will consider in 5 weeks. - Diet recommendations: Dysphagia 2 (fine chop); Honey-thick liquid - continue to monitor Mag, K, and Phos, replete as needed - low Mag of 1.4  on 8/22, will give 2 g - K 3.0 08/23, will replete w/ Kdur - OT recommends SNF w/ 3in1 bedside commode; tub/shower  HTN: Normotensive 8/21. -Continue Norvasc 10 mg -Continue Hydral 5 mg with parameters: SBP>170 or DBP>110  -Monitor blood pressures  Hepatitis B: Not currently taking medication and does not follow with Infectious Disease. LFTs with AST 19, ALT 13 and alk phos 58. Normal total bili 0.8 and direct bili 0.2.   -Continue to monitor   H/o drug use and insecure home environment:In ED, UDS +for cocaine and amphetamines. May have contributed to his elevated BP on admission depending on time of ingestion.  -CSW states pt isnt interested in help.  FEN/GI: Dysphagia 2 (fine chop);Honey-thick liquid, s/p NG tube Prophylaxis: Lovenox   Disposition:med-surg  Subjective:  Kenneth Rivas is resting in bed. Says he feels fine.  No complaints.   Objective: Temp:  [98.1 F (36.7 C)-98.5 F (36.9 C)] 98.3 F (36.8 C) (08/23 0519) Pulse Rate:  [74-76] 75 (08/23 0519) Resp:  [14-18] 18 (08/23 0519) BP: (106-117)/(57-70) 106/57 (08/23 0519) SpO2:  [100 %] 100 % (08/23 0519) Weight:  [107 lb 11.2 oz (48.9 kg)] 107 lb 11.2 oz (48.9 kg) (08/23 0519) Physical Exam: General: cachetic male, chronically ill appearing Eye: Left eye ptosis, vision intact when eyelid retracted Cardiovascular: RRR, no MRG Respiratory: ctab, normal work of breathing Abdomen: soft, nontender  Extremities: decreased muscle mass, patches of very dry skin  Laboratory:  Recent Labs Lab 07/07/17 0534 07/08/17 0509 07/09/17 0338  WBC 2.2* 1.9* 2.1*  HGB 9.2* 8.9* 9.3*  HCT 27.8* 27.1* 28.3*  PLT 262 250 246    Recent Labs Lab 07/07/17 0534 07/08/17 0509 07/09/17 0338  NA 133* 134* 130*  K 3.4* 3.7 3.0*  CL 104 104 101  CO2 '23 26 23  '$ BUN '10 7 9  '$ CREATININE 0.59* 0.62 0.61  CALCIUM 8.5* 8.7* 8.4*  GLUCOSE 84 87 84   Phos 4.0, Mag 1.4  Imaging/Diagnostic Tests: Ct Chest W  Contrast  Result Date: 06/17/2017 CLINICAL DATA:  Shortness of breath. EXAM: CT CHEST WITH CONTRAST TECHNIQUE: Multidetector CT imaging of the chest was performed during intravenous contrast administration. CONTRAST:  45m ISOVUE-300 IOPAMIDOL (ISOVUE-300) INJECTION 61% COMPARISON:  Radiographs of June 16, 2017. FINDINGS: Cardiovascular: Normal heart size. No pericardial effusion. Atherosclerosis of thoracic aorta is noted. No dissection or aneurysm is noted. Mediastinum/Nodes: No enlarged mediastinal, hilar, or axillary lymph nodes. Thyroid gland, trachea, and esophagus demonstrate no significant findings. Lungs/Pleura: No pneumothorax or pleural effusion is noted. 3 mm nodule is noted in left upper lobe best seen on image number 54 of series 4. 7 mm cavitating abnormality is noted in the right middle lobe best seen on image number 104 series 4. Upper Abdomen: No acute abnormality. Musculoskeletal: No chest wall abnormality. No acute or significant osseous findings. IMPRESSION: 7 mm cavitary lesion is seen in the right middle lobe. It is uncertain if this represents cavitary metastatic lesion or small pulmonary abscess. Clinical correlation is recommended. Follow-up CT scan in 6-8 weeks is recommended to determine stability or resolution. Aortic Atherosclerosis (ICD10-I70.0). Electronically Signed   By: JMarijo Conception M.D.   On: 06/17/2017 12:04   Dg Chest Portable 1 View  Result Date: 06/16/2017 CLINICAL DATA:  Three weeks of weakness, anorexia, emaciation. History of hepatitis-B, HIV-AIDS. EXAM: PORTABLE CHEST 1 VIEW COMPARISON:  Chest x-ray of June 11, 2017 FINDINGS: The lungs are hyperinflated. There is no focal infiltrate. There are subtle nodules of varying sizes up to 3 mm in diameter in both lungs. Some likely reflect pulmonary vessels on the end but others are more peripheral. The heart and pulmonary vascularity are normal. The mediastinum is normal in width. There is no pleural effusion.  IMPRESSION: No classic alveolar pneumonia. No CHF. Tiny pulmonary parenchymal nodules are suspected bilaterally and may reflect infectious or neoplastic processes. Chest CT scanning is recommended. Electronically Signed   By: David  JMartiniqueM.D.   On: 06/16/2017 16:45     TBonnita Hollow MD 07/09/2017, 7:04 AM PGY-1, CAntelopeIntern pager: [phone] text pages welcome

## 2017-07-09 NOTE — Progress Notes (Signed)
Physical Therapy Treatment Patient Details Name: Kenneth Rivas MRN: [id number] DOB: 07-Apr-1961 Today's Date: 07/09/2017    History of Present Illness 56 y.o. 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 Admitted on 7/31 w/ cc: weakness and FTT. Admitting dx: FTT and cachexia d/t AIDs, pulmonary nodules, bedbugs, and HTN. Had CT chest showing cavitary lung lesion in the RML. 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 Intubated 8/3-8/11.    PT Comments    Improving slowly.  Still needs RW and assist for stability.  Trying different stategies to get pt to push himself to do more.   Follow Up Recommendations  SNF;Supervision/Assistance - 24 hour     Equipment Recommendations  Other (comment) (TBA)    Recommendations for Other Services       Precautions / Restrictions Precautions Precautions: Fall Restrictions Weight Bearing Restrictions: No    Mobility  Bed Mobility Overal bed mobility: Needs Assistance Bed Mobility: Sidelying to Sit;Supine to Sit   Sidelying to sit: Min guard;HOB elevated (rail) Supine to sit: Supervision Sit to supine: Supervision   General bed mobility comments: use of the rail, but smooth otherwise  Transfers Overall transfer level: Needs assistance Equipment used: Rolling walker (2 wheeled) Transfers: Sit to/from Stand Sit to Stand: Min assist;Min guard         General transfer comment: cues for hand placement at times  Ambulation/Gait Ambulation/Gait assistance: Min assist Ambulation Distance (Feet): 170 Feet Assistive device: Rolling walker (2 wheeled) Gait Pattern/deviations: Step-through pattern;Scissoring;Drifts right/left Gait velocity: slower Gait velocity interpretation: Below normal speed for age/gender General Gait Details: moderately unsteady, but mild improvement with distance until  fatigue sets in.   Stairs            Wheelchair Mobility    Modified Rankin (Stroke Patients Only)       Balance Overall balance assessment: Needs assistance Sitting-balance support: No upper extremity supported;Feet supported Sitting balance-Leahy Scale: Fair Sitting balance - Comments: static sitting EOB    Standing balance support: Bilateral upper extremity supported Standing balance-Leahy Scale: Poor Standing balance comment: Reliant on BUE support and external assistance.                            Cognition Arousal/Alertness: Awake/alert Behavior During Therapy: WFL for tasks assessed/performed Overall Cognitive Status: Within Functional Limits for tasks assessed                                        Exercises General Exercises - Upper Extremity Shoulder Flexion: AROM;Strengthening;Both;10 reps;Seated Elbow Flexion: AROM;Both;10 reps;Seated;Strengthening;Theraband Theraband Level (Elbow Flexion): Level 1 (Yellow) Elbow Extension: AROM;Both;10 reps;Seated Digit Composite Flexion: AROM;Both;10 reps;Seated Composite Extension: AROM;Both;10 reps;Seated Chair Push Up: AROM;5 reps;Seated    General Comments        Pertinent Vitals/Pain Pain Assessment: Faces Faces Pain Scale: No hurt Pain Location: stiffness in BUE Pain Descriptors / Indicators: Aching;Tightness;Tiring    Home Living                      Prior Function            PT Goals (current goals can now be found in the care plan section) Acute Rehab PT Goals Patient Stated Goal: to get around better and have more  energy PT Goal Formulation: With patient Time For Goal Achievement: 07/16/17 Potential to Achieve Goals: Fair Progress towards PT goals: Progressing toward goals    Frequency    Min 3X/week      PT Plan Current plan remains appropriate    Co-evaluation              AM-PAC PT "6 Clicks" Daily Activity  Outcome Measure   Difficulty turning over in bed (including adjusting bedclothes, sheets and blankets)?: A Little Difficulty moving from lying on back to sitting on the side of the bed? : Unable Difficulty sitting down on and standing up from a chair with arms (e.g., wheelchair, bedside commode, etc,.)?: Unable Help needed moving to and from a bed to chair (including a wheelchair)?: A Little Help needed walking in hospital room?: A Little Help needed climbing 3-5 steps with a railing? : A Lot 6 Click Score: 13    End of Session   Activity Tolerance: Patient limited by fatigue Patient left: in bed;with call bell/phone within reach;with bed alarm set Nurse Communication: Mobility status PT Visit Diagnosis: Difficulty in walking, not elsewhere classified (R26.2);Muscle weakness (generalized) (M62.81);Other abnormalities of gait and mobility (R26.89)     Time: 1610-9604 PT Time Calculation (min) (ACUTE ONLY): 18 min  Charges:  $Gait Training: 8-22 mins                    G Codes:       07/15/2017  Crook Bing, PT [phone]  (pager)   Eliseo Gum Nino Amano 2017/07/15, 5:33 PM

## 2017-07-09 NOTE — Progress Notes (Addendum)
  Speech Language Pathology Treatment: Dysphagia  Patient Details Name: Kenneth Rivas MRN: [id number] DOB: May 16, 1961 Today's Date: 07/09/2017 Time: 1510-1530 SLP Time Calculation (min) (ACUTE ONLY): 20 min  Assessment / Plan / Recommendation Clinical Impression  Pt seen at bedside for assessment of diet tolerance and readiness for advanced diet. Pt appears to tolerate current diet (dys 2/thin liquids), and demonstrated tolerance of graham cracker today. Speech fully intelligible, and there was decreased need to facilitate lip closure with po trials. Recommend advancing diet to dys 3 with chopped meats, thin liquids. Continue to crush meds in puree. ST will continue to follow for diet tolerance and education.   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 Admitted on 7/31 w/ cc: weakness and FTT. Admitting dx: FTT and cachexia d/t AIDs, pulmonary nodules, bedbugs, and HTN. Had CT chest showing cavitary lung lesion in the RML. 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 Intubated 8/3-8/11.       SLP Plan  Continue with current plan of care       Recommendations  Diet recommendations: Dysphagia 3 (mechanical soft);Thin liquid, chop meats Liquids provided via: Cup;Straw Medication Administration: Crushed with puree Supervision: Patient able to self feed Compensations: Slow rate;Small sips/bites;Minimize environmental distractions Postural Changes and/or Swallow Maneuvers: Seated upright 90 degrees                Oral Care Recommendations: Oral care BID Follow up Recommendations: Skilled Nursing facility SLP Visit Diagnosis: Dysphagia, oral phase (R13.11) Plan: Continue with current plan of care       GO              Celia B. Murvin [facility], CCC-SLP Speech Pathologist [phone]  Leigh Aurora 07/09/2017, 3:36  PM

## 2017-07-09 NOTE — Progress Notes (Signed)
Nutrition Follow-up  DOCUMENTATION CODES:   Underweight, Severe malnutrition in context of chronic illness  INTERVENTION:   -Decrease Ensure Enlive po to BID, each supplement provides 350 kcal and 20 grams of protein -30 ml Prostat daily -Continue MVI daily  NUTRITION DIAGNOSIS:   Malnutrition (Severe) related to chronic illness (HIV) as evidenced by severe depletion of body fat, severe depletion of muscle mass, energy intake < 75% for > or equal to 1 month.  Ongoing  GOAL:   Patient will meet greater than or equal to 90% of their needs  Progressing  MONITOR:   PO intake, Supplement acceptance, Diet advancement, Labs, Skin, I & O's  REASON FOR ASSESSMENT:   Consult Enteral/tube feeding initiation and management  ASSESSMENT:   Pt admitted with bed bugs, cachexia and feeling weak and tired x past 4 weeks, presumed cryptoccoal meningitis. PMH is significant for AIDS, hep B, cocaine use, and HTN.    8/11- extubated 8/12- s/p BSE and MBSS, recommending continued NPO status  8/15- s/p LP 8/16- NGT removed (coiled in throat), s/p BSE- advanced to dysphagia 2 diet with honey thick liquids 8/17- s/p MBSS- advanced to dysphagia 2 diet with thin liquids  Pt sleeping soundly at time of visit. Per SLP notes, pt tolerating current diet texture well, however, appetite is decreased.   Pt consumed approximately 50% of Ensure supplement at bedside. Observed breakfast tray; pt consumed about 50% of pancakes. Meal completion 50-100%.   Labs reviewed: Na: 134, Mg: 1.4, Phos and K WDL.   Diet Order:  DIET DYS 2 Room service appropriate? Yes; Fluid consistency: Thin  Skin:  Wound (see comment) (stage II sacrum)  Last BM:  07/05/17  Height:   Ht Readings from Last 1 Encounters:  06/19/17 5\' 6"  (1.676 m)    Weight:   Wt Readings from Last 1 Encounters:  07/09/17 107 lb 11.2 oz (48.9 kg)    Ideal Body Weight:  50 kg  BMI:  Body mass index is 17.38 kg/m.  Estimated  Nutritional Needs:   Kcal:  2000-2200  Protein:  100-115 grams  Fluid:  > 2.0 L  EDUCATION NEEDS:   Education needs addressed  Chaysen Tillman A. Mayford Knife, RD, LDN, CDE Pager: [phone] After hours Pager: [phone]

## 2017-07-09 NOTE — Progress Notes (Signed)
Patient ID: Kenneth Rivas, male   DOB: [birth year], 56 y.o.   MRN: [id number]          [facility]  Date of Admission:  06/16/2017           Day 22 amphotericin        Day 4 fluconazole ASSESSMENT: He remains clinically stable. His CSF cultures are negative at 24 hours I would like to see negative CSF cultures before changing to oral fluconazole alone.  PLAN: 1. Continue amphotericin and fluconazole 2. Await results of final CSF cultures  Principal Problem:   Cryptococcosis (HCC) Active Problems:   Weakness   Cachexia associated with AIDS (HCC)   Abnormal chest x-ray   Protein-calorie malnutrition, severe   AIDS (acquired immune deficiency syndrome) (HCC)   Cavitary pneumonia   Refeeding syndrome   Hypophosphatemia   Meningitis due to Cryptococcus species (HCC)   Lung abscess (HCC)   Cellulitis of right hand   Ptosis, left eyelid   Acute respiratory failure with hypoxia (HCC)   Pressure injury of skin   Blurry vision, left eye   Meningitis   Encounter for imaging study to confirm nasogastric (NG) tube placement   . amLODipine  10 mg Oral Daily  . azithromycin  1,200 mg Oral Weekly  . chlorhexidine  15 mL Mouth Rinse BID  . dextrose  10 mL Intravenous Q24H  . dextrose  10 mL Intravenous Q24H  . feeding supplement (ENSURE ENLIVE)  237 mL Oral TID BM  . fluconazole  800 mg Oral Daily  . heparin subcutaneous  5,000 Units Subcutaneous Q8H  . lidocaine (PF)  5 mL Intradermal Once  . mouth rinse  15 mL Mouth Rinse q12n4p  . multivitamin with minerals  1 tablet Oral Daily  . pantoprazole sodium  40 mg Oral Daily  . polyvinyl alcohol  1 drop Both Eyes QHS  . sennosides  5 mL Oral QHS  . sulfamethoxazole-trimethoprim  1 tablet Oral Daily    SUBJECTIVE: He denies headache.   Review of Systems: Review of Systems  Constitutional: Negative for chills, diaphoresis and fever.  Gastrointestinal: Negative for abdominal pain, diarrhea, nausea and  vomiting.  Neurological: Negative for headaches.    No Active Allergies  OBJECTIVE: Vitals:   07/08/17 1455 07/08/17 2108 07/09/17 0519 07/09/17 0958  BP: 117/70 107/62 (!) 106/57 108/73  Pulse: 76 74 75   Resp: 14 17 18    Temp: 98.1 F (36.7 C) 98.5 F (36.9 C) 98.3 F (36.8 C)   TempSrc: Oral Oral Oral   SpO2: 100% 100% 100%   Weight:   107 lb 11.2 oz (48.9 kg)   Height:       Body mass index is 17.38 kg/m.  Physical Exam  Constitutional: He is oriented to person, place, and time.  He is resting quietly in bed watching television.  HENT:  No change in left ptosis.  Cardiovascular: Normal rate and regular rhythm.   No murmur heard. Pulmonary/Chest: Effort normal and breath sounds normal.  Neurological: He is alert and oriented to person, place, and time.  Psychiatric: Mood and affect normal.    Lab Results Lab Results  Component Value Date   WBC 2.1 (L) 07/09/2017   HGB 9.3 (L) 07/09/2017   HCT 28.3 (L) 07/09/2017   MCV 80.6 07/09/2017   PLT 246 07/09/2017    Lab Results  Component Value Date   CREATININE 0.61 07/09/2017   BUN 9 07/09/2017   NA 130 (L)  07/09/2017   K 3.0 (L) 07/09/2017   CL 101 07/09/2017   CO2 23 07/09/2017    Lab Results  Component Value Date   ALT 47 06/27/2017   AST 53 (H) 06/27/2017   ALKPHOS 77 06/27/2017   BILITOT 0.2 (L) 06/27/2017     Microbiology: Recent Results (from the past 240 hour(s))  CSF culture     Status: Abnormal   Collection Time: 07/01/17  1:57 PM  Result Value Ref Range Status   Specimen Description CSF  Final   Special Requests NONE  Final   Gram Stain   Final    WBC PRESENT, PREDOMINANTLY MONONUCLEAR ENCAPSULATED YEAST SEEN CYTOSPIN SMEAR CRITICAL RESULT CALLED TO, READ BACK BY AND VERIFIED WITH: K. PRICE, RN AT 1510 ON 07/01/17 BY C. JESSUP, MLT.    Culture CRYPTOCOCCUS NEOFORMANS (A)  Final   Report Status 07/05/2017 FINAL  Final  CSF culture     Status: None (Preliminary result)   Collection  Time: 07/08/17  1:35 PM  Result Value Ref Range Status   Specimen Description CSF  Final   Special Requests NONE  Final   Gram Stain   Final    CYTOSPIN SMEAR WBC PRESENT, PREDOMINANTLY MONONUCLEAR YEAST CRITICAL RESULT CALLED TO, READ BACK BY AND VERIFIED WITH: S JONES AT 1443 ON [id number] BY SJW    Culture NO GROWTH < 24 HOURS  Final   Report Status PENDING  Incomplete    [name], MD [facility] [phone] pager   [phone] cell 07/09/2017, 2:28 PM

## 2017-07-09 NOTE — Progress Notes (Signed)
Occupational Therapy Treatment Patient Details Name: Kenneth Rivas MRN: [id number] DOB: 01-26-1961 Today's Date: 07/09/2017    History of present illness 56 y.o. 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 Admitted on 7/31 w/ cc: weakness and FTT. Admitting dx: FTT and cachexia d/t AIDs, pulmonary nodules, bedbugs, and HTN. Had CT chest showing cavitary lung lesion in the RML. 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 Intubated 8/3-8/11.   OT comments  Pt progressing with OT goals this session, Pt able to complete HEP provided yesterday, demonstrating good recall and stating that "I enjoy doing it because it helps me feel like I'm working for something". Pt educated on energy conservation strategies for ADL and he continues to improve strength and encouraged for OOB activity.  Pt will continue to benefit from skilled OT in the acute setting prior to SNF level therapies to maximize safety and independence in ADL and functional transfers.   Follow Up Recommendations  SNF    Equipment Recommendations  3 in 1 bedside commode;Tub/shower bench    Recommendations for Other Services      Precautions / Restrictions Precautions Precautions: Fall Restrictions Weight Bearing Restrictions: No       Mobility Bed Mobility Overal bed mobility: Needs Assistance Bed Mobility: Supine to Sit;Sit to Supine     Supine to sit: Supervision Sit to supine: Supervision   General bed mobility comments: use of the rail, but smooth otherwise  Transfers                 General transfer comment: Pt declined OOB transfer this session    Balance Overall balance assessment: Needs assistance Sitting-balance support: No upper extremity supported;Feet supported Sitting balance-Leahy Scale: Fair Sitting balance - Comments: static sitting EOB                                    ADL either performed or assessed with clinical judgement   ADL       Grooming: Wash/dry hands;Wash/dry face;Set up;Bed level Grooming Details (indicate cue type and reason): assist for standing balance, Pt supporting self using forearms on sink                                      Vision       Perception     Praxis      Cognition Arousal/Alertness: Awake/alert Behavior During Therapy: WFL for tasks assessed/performed Overall Cognitive Status: Within Functional Limits for tasks assessed                                          Exercises Exercises: General Upper Extremity General Exercises - Upper Extremity Shoulder Flexion: AROM;Strengthening;Both;10 reps;Seated Elbow Flexion: AROM;Both;10 reps;Seated;Strengthening;Theraband Theraband Level (Elbow Flexion): Level 1 (Yellow) Elbow Extension: AROM;Both;10 reps;Seated Digit Composite Flexion: AROM;Both;10 reps;Seated Composite Extension: AROM;Both;10 reps;Seated Chair Push Up: AROM;5 reps;Seated   Shoulder Instructions       General Comments      Pertinent Vitals/ Pain       Pain Assessment: Faces Faces Pain Scale: Hurts a little bit Pain Location: stiffness in BUE Pain Descriptors / Indicators: Aching;Tightness;Tiring  Home Living  Prior Functioning/Environment              Frequency  Min 2X/week        Progress Toward Goals  OT Goals(current goals can now be found in the care plan section)  Progress towards OT goals: Progressing toward goals  Acute Rehab OT Goals Patient Stated Goal: to get around better and have more energy OT Goal Formulation: With patient Time For Goal Achievement: 07/13/17 Potential to Achieve Goals: Good  Plan Discharge plan remains appropriate;Frequency needs to be updated    Co-evaluation                 AM-PAC PT "6 Clicks" Daily  Activity     Outcome Measure   Help from another person eating meals?: A Little Help from another person taking care of personal grooming?: A Little Help from another person toileting, which includes using toliet, bedpan, or urinal?: A Little Help from another person bathing (including washing, rinsing, drying)?: A Little Help from another person to put on and taking off regular upper body clothing?: A Little Help from another person to put on and taking off regular lower body clothing?: A Lot 6 Click Score: 17    End of Session Equipment Utilized During Treatment: Other (comment) (theraband)  OT Visit Diagnosis: Muscle weakness (generalized) (M62.81);Low vision, both eyes (H54.2)   Activity Tolerance Patient tolerated treatment well   Patient Left in bed;with call bell/phone within reach;with bed alarm set   Nurse Communication Mobility status        Time: 1650-1706 OT Time Calculation (min): 16 min  Charges: OT General Charges $OT Visit: 1 Procedure OT Treatments $Therapeutic Exercise: 8-22 mins  Sherryl Manges OTR/L [phone]   Kenneth Rivas 07/09/2017, 5:07 PM

## 2017-07-10 LAB — CULTURE, FUNGUS WITHOUT SMEAR

## 2017-07-10 LAB — CBC WITH DIFFERENTIAL/PLATELET
BASOS ABS: 0 10*3/uL (ref 0.0–0.1)
Basophils Relative: 2 %
EOS ABS: 0.2 10*3/uL (ref 0.0–0.7)
Eosinophils Relative: 9 %
HEMATOCRIT: 27.2 % — AB (ref 39.0–52.0)
HEMOGLOBIN: 8.9 g/dL — AB (ref 13.0–17.0)
LYMPHS PCT: 27 %
Lymphs Abs: 0.6 10*3/uL — ABNORMAL LOW (ref 0.7–4.0)
MCH: 26.6 pg (ref 26.0–34.0)
MCHC: 32.7 g/dL (ref 30.0–36.0)
MCV: 81.2 fL (ref 78.0–100.0)
MONOS PCT: 15 %
Monocytes Absolute: 0.3 10*3/uL (ref 0.1–1.0)
NEUTROS PCT: 47 %
Neutro Abs: 1 10*3/uL — ABNORMAL LOW (ref 1.7–7.7)
Platelets: 249 10*3/uL (ref 150–400)
RBC: 3.35 MIL/uL — ABNORMAL LOW (ref 4.22–5.81)
RDW: 15.3 % (ref 11.5–15.5)
WBC: 2.1 10*3/uL — ABNORMAL LOW (ref 4.0–10.5)

## 2017-07-10 LAB — MAGNESIUM: MAGNESIUM: 1.5 mg/dL — AB (ref 1.7–2.4)

## 2017-07-10 LAB — BASIC METABOLIC PANEL
Anion gap: 8 (ref 5–15)
BUN: 13 mg/dL (ref 6–20)
CO2: 22 mmol/L (ref 22–32)
CREATININE: 0.7 mg/dL (ref 0.61–1.24)
Calcium: 8.6 mg/dL — ABNORMAL LOW (ref 8.9–10.3)
Chloride: 104 mmol/L (ref 101–111)
Glucose, Bld: 86 mg/dL (ref 65–99)
Potassium: 3.7 mmol/L (ref 3.5–5.1)
SODIUM: 134 mmol/L — AB (ref 135–145)

## 2017-07-10 LAB — PHOSPHORUS: PHOSPHORUS: 3.8 mg/dL (ref 2.5–4.6)

## 2017-07-10 MED ORDER — ENOXAPARIN SODIUM 30 MG/0.3ML ~~LOC~~ SOLN
30.0000 mg | SUBCUTANEOUS | Status: DC
  Administered 2017-07-10 – 2017-07-14 (×5): 30 mg via SUBCUTANEOUS
  Filled 2017-07-10 (×5): qty 0.3

## 2017-07-10 MED ORDER — SODIUM CHLORIDE 0.9 % IV BOLUS (SEPSIS)
500.0000 mL | INTRAVENOUS | Status: DC
  Administered 2017-07-10 – 2017-07-12 (×3): 500 mL via INTRAVENOUS

## 2017-07-10 MED ORDER — MAGNESIUM SULFATE 2 GM/50ML IV SOLN
2.0000 g | Freq: Once | INTRAVENOUS | Status: AC
  Administered 2017-07-10: 2 g via INTRAVENOUS
  Filled 2017-07-10: qty 50

## 2017-07-10 MED ORDER — ENOXAPARIN SODIUM 40 MG/0.4ML ~~LOC~~ SOLN: 40.0000 mg | SUBCUTANEOUS | Status: DC

## 2017-07-10 MED ORDER — POTASSIUM CHLORIDE CRYS ER 20 MEQ PO TBCR
20.0000 meq | EXTENDED_RELEASE_TABLET | Freq: Once | ORAL | Status: AC
  Administered 2017-07-10: 20 meq via ORAL
  Filled 2017-07-10: qty 1

## 2017-07-10 NOTE — Progress Notes (Signed)
Report called to 6N. All questions answered. Pt is transferring because TB is positive. Pt will when negative pressure is turned on.

## 2017-07-10 NOTE — Progress Notes (Signed)
  Speech Language Pathology Treatment: Dysphagia  Patient Details Name: Kenneth Rivas MRN: [id number] DOB: 1961-03-28 Today's Date: 07/10/2017 Time: 1010-1030 SLP Time Calculation (min) (ACUTE ONLY): 20 min  Assessment / Plan / Recommendation Clinical Impression  Skilled treatment session focused on dysphagia goals. SLP facilitated session by providing skilled observation of pt consuming regular textures with thin liquids via straw. Pt with effective oral manipulation of bolus, complete oral clearing and no overt s/s of aspiration. Pt's speech was fully intelligible at the conversation level. Education provided to nurse on recommendation to upgrade diet to regular and medicine whole (1 at a time). No further services are indicated, ST to sign off.    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 Admitted on 7/31 w/ cc: weakness and FTT. Admitting dx: FTT and cachexia d/t AIDs, pulmonary nodules, bedbugs, and HTN. Had CT chest showing cavitary lung lesion in the RML. 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 Intubated 8/3-8/11.       SLP Plan          Recommendations  Diet recommendations: Regular;Thin liquid Liquids provided via: Cup;Straw Medication Administration: Whole meds with liquid Supervision: Patient able to self feed Compensations: Slow rate;Small sips/bites;Minimize environmental distractions Postural Changes and/or Swallow Maneuvers: Seated upright 90 degrees                Oral Care Recommendations: Oral care BID Follow up Recommendations: Skilled Nursing facility SLP Visit Diagnosis: Dysphagia, oral phase (R13.11)       GO                Gema Ringold 07/10/2017, 11:29 AM

## 2017-07-10 NOTE — Progress Notes (Signed)
Pharmacy Antibiotic Note  Kenneth Rivas is a 56 y.o. male admitted on 06/16/2017 with AIDS and failure to thrive. Pt with acute cryptococcal bloodstream and CSF infection also with cavitary pulmonary nodule. Bactrim and azithromycin for PCP/MAC prophylaxis respectively due to CD4 count of 2.  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   Continuing ampho B until CSF negative.  Also on IV fluconazole. Most recent CSF cultures (8/22) showing no growth x 24 hours.    Magnesium 1.5, potassium 3.7  Plan: -Continue Amphotericin B per ID -Monitor renal fx, cultures, Mg & K -KCl and Mg replacement ordered today -F/u 8/22 CSF cultures -Fluconazole per MD   Height: _0  (167.6 cm) Weight: 108 lb 11.2 oz (49.3 kg) IBW/kg (Calculated) : 63.8  Temp (24hrs), Avg:98.4 F (36.9 C), Min:98.3 F (36.8 C), Max:98.5 F (36.9 C)   Recent Labs Lab 07/06/17 0324 07/07/17 0534 07/08/17 0509 07/09/17 0338 07/10/17 0730  WBC 2.1* 2.2* 1.9* 2.1* 2.1*  CREATININE 0.57* 0.59* 0.62 0.61 0.70     Antimicrobials this admission: 8/5 Ceftaz>> 8/12 8/2 AmphB>> 8/2 Flucytosine>>08/08 d/c'ed per ID d/t leukopenia 8/3 Zosyn >>8/5 8/3 Vanc >>8/5 8/4 Bactrim >> 8/4 Azithromycin >> 8/20 Fluconazole >>  Microbiology results:  8/22 CSF: ngtd 8/15 CSF: CRYPTOCOCCUS NEOFORMANS  8/8 CSF: cryptococcus 8/6 CSF: cryptoc8/3 BAL Fungus Culture: Candida Albicans>>already on ampho 8/3 Pneumocystis smear: negative 8/3 AFB: NEG 8/3 CSF - yeast (cryptococcus neoformans) 8/3 MRSA - NEG 8/3 BAL: yeast; pseudomonas (pansensitive) 8/2 blood cx: crypto neoformans 8/2 sputum: not acceptable for testing 8/2 Crypto: posoccus   Thank you for allowing [place] to participate in this patients care.  [employer], Pharm.D., BCPS Clinical Pharmacist Pager: [phone] Clinical phone for 07/10/2017 from  8:30-4:00 is [phone]. After 4pm, please call Main Rx [phone]) for assistance. 07/10/2017 10:56 AM

## 2017-07-10 NOTE — Progress Notes (Signed)
Kenneth Rivas transferred to [facility] via bed with all belongings.

## 2017-07-10 NOTE — Progress Notes (Signed)
Physical Therapy Treatment Patient Details Name: Kenneth Rivas MRN: [id number] DOB: 12-20-1960 Today's Date: 07/10/2017    History of Present Illness 56 y.o. 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 Admitted on 7/31 w/ cc: weakness and FTT. Admitting dx: FTT and cachexia d/t AIDs, pulmonary nodules, bedbugs, and HTN. Had CT chest showing cavitary lung lesion in the RML. 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 Intubated 8/3-8/11.    PT Comments    Slowly progressing, Slowly improving strength in leg stabilizers with better control of gait, but still very quick to fatigue and get uncoordinated quick.   Follow Up Recommendations  SNF;Supervision/Assistance - 24 hour     Equipment Recommendations  Other (comment) (TBA at next venue)    Recommendations for Other Services       Precautions / Restrictions Precautions Precautions: Fall Restrictions Weight Bearing Restrictions: No    Mobility  Bed Mobility Overal bed mobility: Needs Assistance Bed Mobility: Rolling;Sidelying to Sit;Sit to Supine Rolling: Supervision Sidelying to sit: HOB elevated;Min guard   Sit to supine: Supervision   General bed mobility comments: use fo rail and HOB raised  Transfers Overall transfer level: Needs assistance Equipment used: Rolling walker (2 wheeled) Transfers: Sit to/from Stand Sit to Stand: Min guard         General transfer comment: cues for safest hand placement.  pt coming very far foward to allow a more extension moment at knees  Ambulation/Gait Ambulation/Gait assistance: Min guard Ambulation Distance (Feet): 140 Feet Assistive device: Rolling walker (2 wheeled) Gait Pattern/deviations: Step-through pattern;Scissoring Gait velocity: slower Gait velocity interpretation: Below normal speed for age/gender General Gait Details:  moderate unsteadiness with RW.  Pt did not overtly lose his balance, but was moderately unsteady using RW heavily   Stairs            Wheelchair Mobility    Modified Rankin (Stroke Patients Only)       Balance Overall balance assessment: Needs assistance Sitting-balance support: No upper extremity supported;Feet supported Sitting balance-Leahy Scale: Fair Sitting balance - Comments: static sitting EOB    Standing balance support: Bilateral upper extremity supported Standing balance-Leahy Scale: Poor Standing balance comment: Reliant on BUE support and external assistance.                            Cognition Arousal/Alertness: Awake/alert Behavior During Therapy: WFL for tasks assessed/performed Overall Cognitive Status: Within Functional Limits for tasks assessed                                        Exercises      General Comments        Pertinent Vitals/Pain Pain Assessment: No/denies pain Pain Score: 0-No pain Faces Pain Scale: No hurt    Home Living                      Prior Function            PT Goals (current goals can now be found in the care plan section) Acute Rehab PT Goals Patient Stated Goal: to get around better and have more energy PT Goal Formulation: With patient Time For Goal Achievement: 07/16/17 Potential to Achieve Goals: Fair Progress towards PT goals: Progressing toward goals  Frequency    Min 3X/week      PT Plan Current plan remains appropriate    Co-evaluation              AM-PAC PT "6 Clicks" Daily Activity  Outcome Measure  Difficulty turning over in bed (including adjusting bedclothes, sheets and blankets)?: A Little Difficulty moving from lying on back to sitting on the side of the bed? : Unable Difficulty sitting down on and standing up from a chair with arms (e.g., wheelchair, bedside commode, etc,.)?: Unable Help needed moving to and from a bed to chair  (including a wheelchair)?: A Little Help needed walking in hospital room?: A Little Help needed climbing 3-5 steps with a railing? : A Lot 6 Click Score: 1    End of Session   Activity Tolerance: Patient limited by fatigue     PT Visit Diagnosis: Unsteadiness on feet (R26.81);Other abnormalities of gait and mobility (R26.89);Muscle weakness (generalized) (M62.81)     Time: 1012-1029 PT Time Calculation (min) (ACUTE ONLY): 17 min  Charges:  $Gait Training: 8-22 mins                    G Codes:       Jul 23, 2017   Kenneth Rivas, PT [phone]  (pager)   Kenneth Rivas Kenneth Rivas 23-Jul-2017, 1:53 PM

## 2017-07-10 NOTE — Progress Notes (Signed)
Patient ID: Kenneth Rivas, male   DOB: Mar 20, 1961, 56 y.o.   MRN: [id number]          [facility]    Date of Admission:  06/16/2017   Day 23 amphotericin        Day 5 fluconazole  [name] remains clinically stable. His latest CSF culture is negative at 48 hours. If they remain negative I will stop amphotericin and continue fluconazole alone. If they turn positive he will need a repeat lumbar puncture next week. Please call me for any infectious disease questions this weekend.       [name], MD [facility] [phone] pager   [phone] cell 07/10/2017, 11:45 AM

## 2017-07-10 NOTE — Progress Notes (Signed)
Family Medicine Teaching Service Daily Progress Note Intern Pager: [phone]  Patient name: Kenneth Rivas Medical record number: [id number] Date of birth: 07/30/1961 Age: 56 y.o. Gender: male  Primary Care Provider: Tommy Rivas, Kenneth Islam, MD Consultants: ID Code Status: FULL  Pt Overview and Major Events to Date:  Admitted on 7/31, CD4 count 2 On 8/1, found to have cryptococcal antigen in serum On 8/3, Transferred to ICU for LP due to risk of intubation/vent, was intubated after transfer On 8/11, Extubated On 8/13, Transferred to [place] and Plan: Kenneth Rivas a 56 y.o.malepresenting with cachexia and feeling weak and tired x past 4 weeks. PMH is significant for AIDS, hep B, cocaine use, and HTN.   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 On day 22 of amphotericin.  - Repeat LP, open pressure of 9cm, 40 WBC, low protein, nl glucose. Cryptococcal antigen down trending, cultures pending -Per ID, cont amphotericine B and fluconazole- day 4  Left eye ptosis: stable. experiencing left eye ptosis and blurry vision out of left eye; ophthalmologist saw 8/14- 3rd, and mild 4th and 6th nerve palsy will cause double vision- Improving with drops. -cont to monitor   Pneumonia: Pseudomonas on sputum culture. S/p 8 day course of ceftazidime 1g infusion TID, d/c'ed 8/12. On azithromycin 1.2 g weekly and Bactrim DS 800-160 mg daily for prophylaxis due to AIDS with CD4 of 2.   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 - Per ID: Will not start antiretroviral therapy due to risk of IRIS. Consider in 5 weeks. - Diet recommendations: Dysphagia 2 (fine chop); Honey-thick liquid - continue to monitor Mag, K, and Phos, replete as needed - low Mag of 1.5 on  8/24, will give 2 g - K 3.7 08/24 - OT recommends SNF w/ 3in1 bedside commode; tub/shower- ordered - Pt is only 49 kg- Lovenox decreased to 30 mg per pharmacy recommendations  HTN: Normotensive. -Continue Norvasc 10 mg -Continue Hydral 5 mg with parameters: SBP>170 or DBP>110  -Monitor blood pressures  Hepatitis B: Chronic. Not currently taking medication and does not follow with Infectious Disease. LFTs with AST 19, ALT 13 and alk phos 58. Normal total bili 0.8 and direct bili 0.2.   -Continue to monitor   H/o drug use and insecure home environment:In ED, UDS +for cocaine and amphetamines. May have contributed to his elevated BP on admission depending on time of ingestion.  -CSW states pt isnt interested in help.  FEN/GI: Dysphagia 2 (fine chop);Honey-thick liquid, s/p NG tube Prophylaxis: Lovenox   Disposition:med-surg  Subjective:  Kenneth Rivas is resting in bed. Says he feels fine.  No complaints.   Objective: Temp:  [98.3 F (36.8 C)-98.5 F (36.9 C)] 98.3 F (36.8 C) (08/24 0510) Pulse Rate:  [73-77] 74 (08/24 0510) Resp:  [16-20] 16 (08/24 0510) BP: (108-118)/(64-73) 110/69 (08/24 0510) SpO2:  [100 %] 100 % (08/24 0510) Weight:  [108 lb 11.2 oz (49.3 kg)] 108 lb 11.2 oz (49.3 kg) (08/24 0510) Physical Exam: General: cachetic male, chronically ill appearing Eye: Left eye ptosis, vision intact when eyelid retracted Cardiovascular: RRR, no MRG Respiratory: ctab, normal work of breathing Abdomen: soft, nontender  Extremities: decreased muscle mass, patches of very dry skin  Laboratory:  Recent Labs Lab 07/08/17 0509 07/09/17 0338 07/10/17 0730  WBC 1.9* 2.1* 2.1*  HGB 8.9* 9.3* 8.9*  HCT 27.1* 28.3* 27.2*  PLT 250 246 249    Recent Labs Lab 07/08/17 0509 07/09/17 0338 07/10/17 0730  NA 134* 130* 134*  K 3.7 3.0* 3.7  CL 104 101 104  CO2 _0 BUN _1 CREATININE 0.62 0.61 0.70  CALCIUM 8.7* 8.4* 8.6*  GLUCOSE 87 84 86   Phos  3.8, Mag 1.5  Imaging/Diagnostic Tests: Ct Chest W Contrast  Result Date: 06/17/2017 CLINICAL DATA:  Shortness of breath. EXAM: CT CHEST WITH CONTRAST TECHNIQUE: Multidetector CT imaging of the chest was performed during intravenous contrast administration. CONTRAST:  34m ISOVUE-300 IOPAMIDOL (ISOVUE-300) INJECTION 61% COMPARISON:  Radiographs of June 16, 2017. FINDINGS: Cardiovascular: Normal heart size. No pericardial effusion. Atherosclerosis of thoracic aorta is noted. No dissection or aneurysm is noted. Mediastinum/Nodes: No enlarged mediastinal, hilar, or axillary lymph nodes. Thyroid gland, trachea, and esophagus demonstrate no significant findings. Lungs/Pleura: No pneumothorax or pleural effusion is noted. 3 mm nodule is noted in left upper lobe best seen on image number 54 of series 4. 7 mm cavitating abnormality is noted in the right middle lobe best seen on image number 104 series 4. Upper Abdomen: No acute abnormality. Musculoskeletal: No chest wall abnormality. No acute or significant osseous findings. IMPRESSION: 7 mm cavitary lesion is seen in the right middle lobe. It is uncertain if this represents cavitary metastatic lesion or small pulmonary abscess. Clinical correlation is recommended. Follow-up CT scan in 6-8 weeks is recommended to determine stability or resolution. Aortic Atherosclerosis (ICD10-I70.0). Electronically Signed   By: JMarijo Conception M.D.   On: 06/17/2017 12:04   Dg Chest Portable 1 View  Result Date: 06/16/2017 CLINICAL DATA:  Three weeks of weakness, anorexia, emaciation. History of hepatitis-B, HIV-AIDS. EXAM: PORTABLE CHEST 1 VIEW COMPARISON:  Chest x-ray of June 11, 2017 FINDINGS: The lungs are hyperinflated. There is no focal infiltrate. There are subtle nodules of varying sizes up to 3 mm in diameter in both lungs. Some likely reflect pulmonary vessels on the end but others are more peripheral. The heart and pulmonary vascularity are normal. The mediastinum is  normal in width. There is no pleural effusion. IMPRESSION: No classic alveolar pneumonia. No CHF. Tiny pulmonary parenchymal nodules are suspected bilaterally and may reflect infectious or neoplastic processes. Chest CT scanning is recommended. Electronically Signed   By: David  JMartiniqueM.D.   On: 06/16/2017 16:45     Alonni Heimsoth, JMartinique DO 07/10/2017, 9:28 AM PGY-1, CN[place]wayIntern pager: [phone] text pages welcome

## 2017-07-10 NOTE — Progress Notes (Signed)
Attempted to give report. Number left to call and get report.

## 2017-07-10 NOTE — Progress Notes (Signed)
This RN was notified by [facility] that pt's culture is positive for Acid-fast bacilli. MD notified.

## 2017-07-11 ENCOUNTER — Inpatient Hospital Stay (HOSPITAL_COMMUNITY): Payer: Medicaid Other

## 2017-07-11 LAB — CBC WITH DIFFERENTIAL/PLATELET
BASOS ABS: 0 10*3/uL (ref 0.0–0.1)
Basophils Relative: 2 %
EOS PCT: 9 %
Eosinophils Absolute: 0.2 10*3/uL (ref 0.0–0.7)
HEMATOCRIT: 26.6 % — AB (ref 39.0–52.0)
Hemoglobin: 8.8 g/dL — ABNORMAL LOW (ref 13.0–17.0)
LYMPHS ABS: 0.7 10*3/uL (ref 0.7–4.0)
Lymphocytes Relative: 33 %
MCH: 26.8 pg (ref 26.0–34.0)
MCHC: 33.1 g/dL (ref 30.0–36.0)
MCV: 81.1 fL (ref 78.0–100.0)
MONOS PCT: 17 %
Monocytes Absolute: 0.3 10*3/uL (ref 0.1–1.0)
NEUTROS ABS: 0.8 10*3/uL — AB (ref 1.7–7.7)
Neutrophils Relative %: 39 %
Platelets: 231 10*3/uL (ref 150–400)
RBC: 3.28 MIL/uL — ABNORMAL LOW (ref 4.22–5.81)
RDW: 15.4 % (ref 11.5–15.5)
WBC: 2 10*3/uL — ABNORMAL LOW (ref 4.0–10.5)

## 2017-07-11 LAB — BASIC METABOLIC PANEL
Anion gap: 5 (ref 5–15)
BUN: 10 mg/dL (ref 6–20)
CHLORIDE: 105 mmol/L (ref 101–111)
CO2: 25 mmol/L (ref 22–32)
CREATININE: 0.63 mg/dL (ref 0.61–1.24)
Calcium: 8.5 mg/dL — ABNORMAL LOW (ref 8.9–10.3)
GFR calc Af Amer: >60 mL/min (ref >60)
GFR calc non Af Amer: >60 mL/min (ref >60)
Glucose, Bld: 87 mg/dL (ref 65–99)
Potassium: 3.3 mmol/L — ABNORMAL LOW (ref 3.5–5.1)
Sodium: 135 mmol/L (ref 135–145)

## 2017-07-11 LAB — MAGNESIUM: MAGNESIUM: 1.5 mg/dL — AB (ref 1.7–2.4)

## 2017-07-11 LAB — PHOSPHORUS: Phosphorus: 3.6 mg/dL (ref 2.5–4.6)

## 2017-07-11 MED ORDER — POTASSIUM CHLORIDE CRYS ER 20 MEQ PO TBCR
40.0000 meq | EXTENDED_RELEASE_TABLET | Freq: Once | ORAL | Status: AC
  Administered 2017-07-11: 40 meq via ORAL
  Filled 2017-07-11: qty 2

## 2017-07-11 MED ORDER — MAGNESIUM SULFATE 2 GM/50ML IV SOLN
2.0000 g | Freq: Once | INTRAVENOUS | Status: AC
  Administered 2017-07-11: 2 g via INTRAVENOUS
  Filled 2017-07-11: qty 50

## 2017-07-11 NOTE — Progress Notes (Signed)
MEDICATION RELATED CONSULT NOTE - FOLLOW UP   Pharmacy Consult for Potassium & Magnesium replacement while on Amphotericin-B  No Active Allergies  Patient Measurements: Height: 5\' 3"  (160 cm) Weight: 119 lb (54 kg) IBW/kg (Calculated) : 56.9   Vital Signs: Temp: 98.5 F (36.9 C) (08/25 0554) Temp Source: Oral (08/25 0554) BP: 110/69 (08/25 0554) Pulse Rate: 89 (08/25 0554) Intake/Output from previous day: 08/24 0701 - 08/25 0700 In: 1885 [P.O.:120; I.V.:190; IV Piggyback:1575] Out: 2400 [Urine:2400] Intake/Output from this shift: No intake/output data recorded.  Labs:  Recent Labs  07/09/17 0338 07/10/17 0730 07/11/17 0451  WBC 2.1* 2.1* 2.0*  HGB 9.3* 8.9* 8.8*  HCT 28.3* 27.2* 26.6*  PLT 246 249 231  CREATININE 0.61 0.70 0.63  MG 1.5* 1.5* 1.5*  PHOS 4.1 3.8 3.6   Estimated Creatinine Clearance: 76.9 mL/min (by C-G formula based on SCr of 0.63 mg/dL).   Medications:  Scheduled:  . amLODipine  10 mg Oral Daily  . azithromycin  1,200 mg Oral Weekly  . dextrose  10 mL Intravenous Q24H  . dextrose  10 mL Intravenous Q24H  . enoxaparin (LOVENOX) injection  30 mg Subcutaneous Q24H  . feeding supplement (ENSURE ENLIVE)  237 mL Oral BID BM  . feeding supplement (PRO-STAT SUGAR FREE 64)  30 mL Oral Daily  . fluconazole  800 mg Oral Daily  . multivitamin with minerals  1 tablet Oral Daily  . pantoprazole sodium  40 mg Oral Daily  . polyvinyl alcohol  1 drop Both Eyes QHS  . potassium chloride  40 mEq Oral Once  . sennosides  5 mL Oral QHS  . sulfamethoxazole-trimethoprim  1 tablet Oral Daily    Assessment: Mg and K are both low, will replace per protocol.  Plan:  KCl po x 1 Mg 2g IV x 1 Daily BMet, Mg   Marisue Humble, B.S., PharmD Clinical Pharmacist [facility]

## 2017-07-11 NOTE — Progress Notes (Signed)
Patient ID: Edvardo Honse, male   DOB: 01/17/1961, 56 y.o.   MRN: [id number]          [facility]ious Disease    Date of Admission:  06/16/2017   Day 24 amphotericin        Day 6 fluconazole  Mr. Choplin remains clinically stable. His latest CSF culture is negative at 72 hours. If they are finalized as negative I will stop amphotericin and continue fluconazole alone. If they turn positive he will need a repeat lumbar puncture next week. Yesterday his recent BAL AFB culture was reported to be positive. On admission there was concern for tiny lung nodules. A CT scan showed a 7 mm cavitary lesion in the right middle lobe. He has no current clinical or radiographic evidence of pneumonia. The AFB probes showed that the isolate was not M tb or M avium. He has not needed to be isolated and I do not feel that he needs any treatment for this at this time.  Michel Bickers, MD [facility] [phone] pager   [phone] cell 07/11/2017, 4:34 PM

## 2017-07-11 NOTE — Progress Notes (Signed)
Family Medicine Teaching Service Daily Progress Note Intern Pager: [phone]  Patient name: Kenneth Rivas Medical record number: [id number] Date of birth: Mar 05, 1961 Age: 56 y.o. Gender: male  Primary Care Provider: Tommy Medal, Lavell Islam, MD Consultants: ID Code Status: FULL  Pt Overview and Major Events to Date:  Admitted on 7/31, CD4 count 2 On 8/1, found to have cryptococcal antigen in serum On 8/3, Transferred to ICU for LP due to risk of intubation/vent, was intubated after transfer On 8/11, Extubated On 8/13, Transferred to [place] and Plan: Kenneth Rivas a 56 y.o.malepresenting with cachexia and feeling weak and tired x past 4 weeks. PMH is significant for AIDS, hep B, cocaine use, and HTN.   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 On day 23 of amphotericin.  - Repeat LP, open pressure of 9cm, 40 WBC, low protein, nl glucose. Cryptococcal antigen down trending, cultures pending -Per ID, cont amphotericine B and fluconazole- day 5  Left eye ptosis: stable. experiencing left eye ptosis and blurry vision out of left eye; ophthalmologist saw 8/14- 3rd, and mild 4th and 6th nerve palsy will cause double vision- Improving with drops. -cont to monitor   Pneumonia: Pseudomonas on sputum culture. S/p 8 day course of ceftazidime 1g infusion TID, d/c'ed 8/12. On azithromycin 1.2 g weekly and Bactrim DS 800-160 mg daily for prophylaxis due to AIDS with CD4 of 2.   - follow ID recommendations - continue abx prophylaxis  Sputum positive for Acid Fast Bacilli: - Sputum from Bronchoalveolar Lavage on 08/03 positive for acid fast on 08/25. Patient now on contact and airborne precautions. Clinically patient not appearing ill with TB- no cough, afebrile. ID will see him this afternoon.  - Will obtain repeat CXR to rule out active TB infection.   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 - Per ID: Will not start antiretroviral therapy due to risk of IRIS. Consider in 5 weeks. - Diet recommendations: Dysphagia 2 (fine chop); Honey-thick liquid - continue to monitor Mag, K, and Phos, replete as needed - low Mag of 1.5 on 8/25, will give 2 g - K 3.3 08/25- will give 40 mEq Kdur - OT recommends SNF w/ 3in1 bedside commode; tub/shower- ordered - Pt is only 49 kg- Lovenox decreased to 30 mg per pharmacy recommendations  HTN: Normotensive. -Continue Norvasc 10 mg -Continue Hydral 5 mg with parameters: SBP>170 or DBP>110  -Monitor blood pressures  Hepatitis B: Chronic. Not currently taking medication and does not follow with Infectious Disease. LFTs with AST 19, ALT 13 and alk phos 58. Normal total bili 0.8 and direct bili 0.2.   -Continue to monitor   H/o drug use and insecure home environment:In ED, UDS +for cocaine and amphetamines. May have contributed to his elevated BP on admission depending on time of ingestion.  -CSW states pt isnt interested in help.  FEN/GI: Dysphagia 2 (fine chop);Honey-thick liquid, s/p NG tube Prophylaxis: Lovenox   Disposition:med-surg  Subjective:  [name] is resting in bed. Says he feels fine.  No complaints.   Objective: Temp:  [98.4 F (36.9 C)-98.5 F (36.9 C)] 98.5 F (36.9 C) (08/25 0554) Pulse Rate:  [77-91] 89 (08/25 0554) Resp:  [17] 17 (08/25 0554) BP: (110-115)/(66-76) 110/69 (08/25 0554) SpO2:  [98 %-100 %] 98 % (08/25 0554) Weight:  [119 lb (54 kg)] 119 lb (54 kg) (08/24 2053)  Physical Exam: General: cachetic male, chronically ill appearing Eye: Left eye ptosis, vision intact when eyelid retracted Cardiovascular: RRR, no MRG Respiratory: ctab, normal work of breathing Abdomen: soft, nontender  Extremities: decreased muscle mass, patches of very dry skin  Laboratory:  Recent Labs Lab 07/09/17 0338  07/10/17 0730 07/11/17 0451  WBC 2.1* 2.1* 2.0*  HGB 9.3* 8.9* 8.8*  HCT 28.3* 27.2* 26.6*  PLT 246 249 231    Recent Labs Lab 07/09/17 0338 07/10/17 0730 07/11/17 0451  NA 130* 134* 135  K 3.0* 3.7 3.3*  CL 101 104 105  CO2 _0 BUN _1 CREATININE 0.61 0.70 0.63  CALCIUM 8.4* 8.6* 8.5*  GLUCOSE 84 86 87   Phos 3.6, Mag 1.5  Imaging/Diagnostic Tests: Ct Chest W Contrast  Result Date: 06/17/2017 CLINICAL DATA:  Shortness of breath. EXAM: CT CHEST WITH CONTRAST TECHNIQUE: Multidetector CT imaging of the chest was performed during intravenous contrast administration. CONTRAST:  5m ISOVUE-300 IOPAMIDOL (ISOVUE-300) INJECTION 61% COMPARISON:  Radiographs of June 16, 2017. FINDINGS: Cardiovascular: Normal heart size. No pericardial effusion. Atherosclerosis of thoracic aorta is noted. No dissection or aneurysm is noted. Mediastinum/Nodes: No enlarged mediastinal, hilar, or axillary lymph nodes. Thyroid gland, trachea, and esophagus demonstrate no significant findings. Lungs/Pleura: No pneumothorax or pleural effusion is noted. 3 mm nodule is noted in left upper lobe best seen on image number 54 of series 4. 7 mm cavitating abnormality is noted in the right middle lobe best seen on image number 104 series 4. Upper Abdomen: No acute abnormality. Musculoskeletal: No chest wall abnormality. No acute or significant osseous findings. IMPRESSION: 7 mm cavitary lesion is seen in the right middle lobe. It is uncertain if this represents cavitary metastatic lesion or small pulmonary abscess. Clinical correlation is recommended. Follow-up CT scan in 6-8 weeks is recommended to determine stability or resolution. Aortic Atherosclerosis (ICD10-I70.0). Electronically Signed   By: JMarijo Conception M.D.   On: 06/17/2017 12:04   Dg Chest Portable 1 View  Result Date: 06/16/2017 CLINICAL DATA:  Three weeks of weakness, anorexia, emaciation. History of hepatitis-B, HIV-AIDS. EXAM: PORTABLE CHEST 1  VIEW COMPARISON:  Chest x-ray of June 11, 2017 FINDINGS: The lungs are hyperinflated. There is no focal infiltrate. There are subtle nodules of varying sizes up to 3 mm in diameter in both lungs. Some likely reflect pulmonary vessels on the end but others are more peripheral. The heart and pulmonary vascularity are normal. The mediastinum is normal in width. There is no pleural effusion. IMPRESSION: No classic alveolar pneumonia. No CHF. Tiny pulmonary parenchymal nodules are suspected bilaterally and may reflect infectious or neoplastic processes. Chest CT scanning is recommended. Electronically Signed   By: David  JMartiniqueM.D.   On: 06/16/2017 16:45     , JMartinique DO 07/11/2017, 8:54 AM PGY-1, CPrairieIntern pager: [phone] text pages welcome

## 2017-07-12 LAB — CBC WITH DIFFERENTIAL/PLATELET
Basophils Absolute: 0 10*3/uL (ref 0.0–0.1)
Basophils Relative: 1 %
EOS PCT: 9 %
Eosinophils Absolute: 0.2 10*3/uL (ref 0.0–0.7)
HEMATOCRIT: 26.4 % — AB (ref 39.0–52.0)
Hemoglobin: 8.7 g/dL — ABNORMAL LOW (ref 13.0–17.0)
LYMPHS ABS: 0.6 10*3/uL — AB (ref 0.7–4.0)
Lymphocytes Relative: 31 %
MCH: 27.4 pg (ref 26.0–34.0)
MCHC: 33 g/dL (ref 30.0–36.0)
MCV: 83 fL (ref 78.0–100.0)
MONO ABS: 0.3 10*3/uL (ref 0.1–1.0)
Monocytes Relative: 17 %
NEUTROS ABS: 0.9 10*3/uL — AB (ref 1.7–7.7)
Neutrophils Relative %: 42 %
PLATELETS: 202 10*3/uL (ref 150–400)
RBC: 3.18 MIL/uL — AB (ref 4.22–5.81)
RDW: 15.8 % — AB (ref 11.5–15.5)
WBC: 2 10*3/uL — AB (ref 4.0–10.5)

## 2017-07-12 LAB — MAGNESIUM
MAGNESIUM: 1.8 mg/dL (ref 1.7–2.4)
Magnesium: 1.5 mg/dL — ABNORMAL LOW (ref 1.7–2.4)

## 2017-07-12 LAB — BASIC METABOLIC PANEL
ANION GAP: 5 (ref 5–15)
Anion gap: 5 (ref 5–15)
BUN: 11 mg/dL (ref 6–20)
BUN: 13 mg/dL (ref 6–20)
CALCIUM: 8.4 mg/dL — AB (ref 8.9–10.3)
CALCIUM: 8.6 mg/dL — AB (ref 8.9–10.3)
CHLORIDE: 105 mmol/L (ref 101–111)
CO2: 25 mmol/L (ref 22–32)
CO2: 26 mmol/L (ref 22–32)
CREATININE: 0.65 mg/dL (ref 0.61–1.24)
Chloride: 105 mmol/L (ref 101–111)
Creatinine, Ser: 0.73 mg/dL (ref 0.61–1.24)
GLUCOSE: 95 mg/dL (ref 65–99)
Glucose, Bld: 93 mg/dL (ref 65–99)
Potassium: 3 mmol/L — ABNORMAL LOW (ref 3.5–5.1)
Potassium: 3.7 mmol/L (ref 3.5–5.1)
SODIUM: 136 mmol/L (ref 135–145)
Sodium: 135 mmol/L (ref 135–145)

## 2017-07-12 LAB — PHOSPHORUS: Phosphorus: 3.7 mg/dL (ref 2.5–4.6)

## 2017-07-12 MED ORDER — POTASSIUM CHLORIDE CRYS ER 20 MEQ PO TBCR
40.0000 meq | EXTENDED_RELEASE_TABLET | Freq: Once | ORAL | Status: AC
  Administered 2017-07-12: 20 meq via ORAL
  Filled 2017-07-12: qty 2
  Filled 2017-07-12: qty 1

## 2017-07-12 MED ORDER — POTASSIUM CHLORIDE 10 MEQ/100ML IV SOLN
10.0000 meq | INTRAVENOUS | Status: AC
  Administered 2017-07-12 (×6): 10 meq via INTRAVENOUS
  Filled 2017-07-12: qty 100

## 2017-07-12 MED ORDER — MAGNESIUM SULFATE 2 GM/50ML IV SOLN
2.0000 g | Freq: Once | INTRAVENOUS | Status: AC
  Administered 2017-07-12: 2 g via INTRAVENOUS
  Filled 2017-07-12: qty 50

## 2017-07-12 MED ORDER — POTASSIUM CHLORIDE CRYS ER 20 MEQ PO TBCR: 40.0000 meq | EXTENDED_RELEASE_TABLET | Freq: Once | ORAL | Status: DC

## 2017-07-12 MED ORDER — MAGNESIUM SULFATE 2 GM/50ML IV SOLN
2.0000 g | Freq: Once | INTRAVENOUS | Status: AC
  Administered 2017-07-13: 2 g via INTRAVENOUS
  Filled 2017-07-12: qty 50

## 2017-07-12 MED ORDER — MAGNESIUM SULFATE IN D5W 1-5 GM/100ML-% IV SOLN
1.0000 g | Freq: Once | INTRAVENOUS | Status: DC
  Filled 2017-07-12: qty 100

## 2017-07-12 NOTE — Progress Notes (Signed)
Family Medicine Teaching Service Daily Progress Note Intern Pager: [phone]  Patient name: Kenneth Rivas Medical record number: [id number] Date of birth: 02/16/1961 Age: 56 y.o. Gender: male  Primary Care Provider: Tommy Medal, Lavell Islam, MD Consultants: ID Code Status: FULL  Pt Overview and Major Events to Date:  Admitted on 7/31, CD4 count 2 On 8/1, found to have cryptococcal antigen in serum On 8/3, Transferred to ICU for LP due to risk of intubation/vent, was intubated after transfer On 8/11, Extubated On 8/13, Transferred to [name] and Plan: Kenneth Rivas a 56 y.o.malepresenting with cachexia and feeling weak and tired x past 4 weeks. PMH is significant for AIDS, hep B, cocaine use, and HTN.   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 On day 25 of amphotericin.  - Repeat LP, open pressure of 9cm, 40 WBC, low protein, nl glucose. Cryptococcal antigen down trending, cultures pending- no growth in 3 days -Per ID, cont amphotericine B and fluconazole- day 7  Left eye ptosis: stable. experiencing left eye ptosis and blurry vision out of left eye; ophthalmologist saw 8/14- 3rd, and mild 4th and 6th nerve palsy will cause double vision- Improving with drops. -cont to monitor   Pneumonia: Pseudomonas on sputum culture. S/p 8 day course of ceftazidime 1g infusion TID, d/c'ed 8/12. On azithromycin 1.2 g weekly and Bactrim DS 800-160 mg daily for prophylaxis due to AIDS with CD4 of 2.   - follow ID recommendations - continue abx prophylaxis  Sputum positive for Acid Fast Bacilli: - Sputum from Bronchoalveolar Lavage on 08/03 positive for acid fast on 08/25.  - Per ID: He has no current clinical or radiographic evidence of pneumonia. The AFB probes showed that the isolate was not M tb or M avium. He has not needed to be isolated and I do not feel that he needs any  treatment for this at this time.- Will discontinue contact and airborne precautions. 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 - Per ID: Will not start antiretroviral therapy due to risk of IRIS. Consider in 5 weeks. - Diet recommendations: Dysphagia 2 (fine chop); Honey-thick liquid - continue to monitor Mag, K, and Phos, replete as needed - low Mag of 1.5 on 8/26, will give 2 g - K 3.0 08/26- pharmacy repleting - OT recommends SNF w/ 3in1 bedside commode; tub/shower- ordered  HTN: Normotensive. -Continue Norvasc 10 mg -Continue Hydral 5 mg with parameters: SBP>170 or DBP>110  -Monitor blood pressures  Hepatitis B: Chronic. Not currently taking medication and does not follow with Infectious Disease. LFTs with AST 19, ALT 13 and alk phos 58. Normal total bili 0.8 and direct bili 0.2.   -Continue to monitor   H/o drug use and insecure home environment:In ED, UDS +for cocaine and amphetamines. May have contributed to his elevated BP on admission depending on time of ingestion.  -CSW states pt isnt interested in help.  FEN/GI: Dysphagia 2 (fine chop);Honey-thick liquid, s/p NG tube Prophylaxis: Lovenox   Disposition:med-surg  Subjective:  Mr. Delisle is resting in bed. Says he feels fine.  No complaints.   Objective: Temp:  [98.5 F (36.9 C)-99.1 F (37.3 C)] 98.5 F (36.9 C) (08/26 0548) Pulse Rate:  [82-94] 90 (08/26 0548) Resp:  [18-20] 19 (08/26 0548) BP: (111-129)/(77-85) 121/80 (08/26 0548) SpO2:  [98 %-100 %] 98 % (  08/26 0548) Weight:  [120 lb 6.4 oz (54.6 kg)] 120 lb 6.4 oz (54.6 kg) (08/26 0548) Physical Exam: General: cachetic male, chronically ill appearing Eye: Left eye ptosis, vision intact when eyelid retracted Cardiovascular: RRR, no MRG Respiratory: ctab, normal work of breathing Abdomen: soft, nontender  Extremities: decreased muscle mass,  patches of very dry skin  Laboratory:  Recent Labs Lab 07/10/17 0730 07/11/17 0451 07/12/17 0315  WBC 2.1* 2.0* 2.0*  HGB 8.9* 8.8* 8.7*  HCT 27.2* 26.6* 26.4*  PLT 249 231 202    Recent Labs Lab 07/10/17 0730 07/11/17 0451 07/12/17 0315  NA 134* 135 135  K 3.7 3.3* 3.0*  CL 104 105 105  CO2 _0 BUN _1 CREATININE 0.70 0.63 0.65  CALCIUM 8.6* 8.5* 8.4*  GLUCOSE 86 87 95   Phos 3.6, Mag 1.5  Imaging/Diagnostic Tests: Ct Chest W Contrast  Result Date: 06/17/2017 CLINICAL DATA:  Shortness of breath. EXAM: CT CHEST WITH CONTRAST TECHNIQUE: Multidetector CT imaging of the chest was performed during intravenous contrast administration. CONTRAST:  52m ISOVUE-300 IOPAMIDOL (ISOVUE-300) INJECTION 61% COMPARISON:  Radiographs of June 16, 2017. FINDINGS: Cardiovascular: Normal heart size. No pericardial effusion. Atherosclerosis of thoracic aorta is noted. No dissection or aneurysm is noted. Mediastinum/Nodes: No enlarged mediastinal, hilar, or axillary lymph nodes. Thyroid gland, trachea, and esophagus demonstrate no significant findings. Lungs/Pleura: No pneumothorax or pleural effusion is noted. 3 mm nodule is noted in left upper lobe best seen on image number 54 of series 4. 7 mm cavitating abnormality is noted in the right middle lobe best seen on image number 104 series 4. Upper Abdomen: No acute abnormality. Musculoskeletal: No chest wall abnormality. No acute or significant osseous findings. IMPRESSION: 7 mm cavitary lesion is seen in the right middle lobe. It is uncertain if this represents cavitary metastatic lesion or small pulmonary abscess. Clinical correlation is recommended. Follow-up CT scan in 6-8 weeks is recommended to determine stability or resolution. Aortic Atherosclerosis (ICD10-I70.0). Electronically Signed   By: JMarijo Conception M.D.   On: 06/17/2017 12:04   Dg Chest Portable 1 View  Result Date: 06/16/2017 CLINICAL DATA:  Three weeks of weakness,  anorexia, emaciation. History of hepatitis-B, HIV-AIDS. EXAM: PORTABLE CHEST 1 VIEW COMPARISON:  Chest x-ray of June 11, 2017 FINDINGS: The lungs are hyperinflated. There is no focal infiltrate. There are subtle nodules of varying sizes up to 3 mm in diameter in both lungs. Some likely reflect pulmonary vessels on the end but others are more peripheral. The heart and pulmonary vascularity are normal. The mediastinum is normal in width. There is no pleural effusion. IMPRESSION: No classic alveolar pneumonia. No CHF. Tiny pulmonary parenchymal nodules are suspected bilaterally and may reflect infectious or neoplastic processes. Chest CT scanning is recommended. Electronically Signed   By: David  JMartiniqueM.D.   On: 06/16/2017 16:45   Dg Chest Port 1 View  Result Date: 07/11/2017 CLINICAL DATA:  Screening for pulmonary TB. EXAM: PORTABLE CHEST 1 VIEW COMPARISON:  06/26/2017 FINDINGS: Both lungs are clear. Heart and mediastinum are within normal limits. The trachea is midline. No acute bone abnormality. Negative for a pneumothorax. Endotracheal tube and nasogastric tube have been removed since the prior examination. IMPRESSION: No acute cardiopulmonary disease. No radiographic findings that are specific for TB. Electronically Signed   By: AMarkus DaftM.D.   On: 07/11/2017 14:43    [place] Schreiner, JMartinique DO 07/12/2017, 9:43 AM PGY-1, CLookout MountainIntern pager: [phone]  text pages welcome

## 2017-07-12 NOTE — Clinical Social Work Note (Signed)
Per CSW handoff and per hospital director we will not do LOG for placement. Pt to d/c to rooming house at d/c. Clinical Social Worker will sign off for now as social work intervention is no longer needed. Please consult [place] again if new need arises.  [place], [place] [phone]

## 2017-07-12 NOTE — Progress Notes (Addendum)
MEDICATION RELATED CONSULT NOTE - FOLLOW UP   Pharmacy Consult for Potassium & Magnesium replacement while on Amphotericin-B  No Active Allergies  Patient Measurements: Height: 5\' 3"  (160 cm) Weight: 120 lb 6.4 oz (54.6 kg) IBW/kg (Calculated) : 56.9   Vital Signs: Temp: 98.5 F (36.9 C) (08/26 0548) Temp Source: Oral (08/25 2225) BP: 121/80 (08/26 0548) Pulse Rate: 90 (08/26 0548) Intake/Output from previous day: 08/25 0701 - 08/26 0700 In: 944 [P.O.:444; IV Piggyback:500] Out: 2750 [Urine:2750] Intake/Output from this shift: No intake/output data recorded.  Labs:  Recent Labs  07/10/17 0730 07/11/17 0451 07/12/17 0315  WBC 2.1* 2.0* 2.0*  HGB 8.9* 8.8* 8.7*  HCT 27.2* 26.6* 26.4*  PLT 249 231 202  CREATININE 0.70 0.63 0.65  MG 1.5* 1.5* 1.5*  PHOS 3.8 3.6 3.7   Estimated Creatinine Clearance: 77.7 mL/min (by C-G formula based on SCr of 0.65 mg/dL).   Medications:  Scheduled:  . amLODipine  10 mg Oral Daily  . azithromycin  1,200 mg Oral Weekly  . dextrose  10 mL Intravenous Q24H  . dextrose  10 mL Intravenous Q24H  . enoxaparin (LOVENOX) injection  30 mg Subcutaneous Q24H  . feeding supplement (ENSURE ENLIVE)  237 mL Oral BID BM  . feeding supplement (PRO-STAT SUGAR FREE 64)  30 mL Oral Daily  . fluconazole  800 mg Oral Daily  . multivitamin with minerals  1 tablet Oral Daily  . pantoprazole sodium  40 mg Oral Daily  . polyvinyl alcohol  1 drop Both Eyes QHS  . sennosides  5 mL Oral QHS  . sulfamethoxazole-trimethoprim  1 tablet Oral Daily    Assessment: Mg and K are both low, will replace per protocol.  Cr is stable and pt with good UOP.  K-Dur po already ordered, but not given.  Plan:  K-runs x 6 and d/c K-Dur Mg 1g IV x 1 Recheck K & Mg this evening Daily BMet, Mg   Marisue Humble, B.S., PharmD Clinical Pharmacist [facility]

## 2017-07-13 LAB — BASIC METABOLIC PANEL
Anion gap: 3 — ABNORMAL LOW (ref 5–15)
BUN: 11 mg/dL (ref 6–20)
CALCIUM: 8.4 mg/dL — AB (ref 8.9–10.3)
CHLORIDE: 105 mmol/L (ref 101–111)
CO2: 25 mmol/L (ref 22–32)
CREATININE: 0.65 mg/dL (ref 0.61–1.24)
Glucose, Bld: 96 mg/dL (ref 65–99)
Potassium: 3.7 mmol/L (ref 3.5–5.1)
SODIUM: 133 mmol/L — AB (ref 135–145)

## 2017-07-13 LAB — CBC WITH DIFFERENTIAL/PLATELET
BASOS PCT: 1 %
Basophils Absolute: 0 10*3/uL (ref 0.0–0.1)
Eosinophils Absolute: 0.2 10*3/uL (ref 0.0–0.7)
Eosinophils Relative: 8 %
HEMATOCRIT: 25.5 % — AB (ref 39.0–52.0)
HEMOGLOBIN: 8.3 g/dL — AB (ref 13.0–17.0)
LYMPHS PCT: 21 %
Lymphs Abs: 0.4 10*3/uL — ABNORMAL LOW (ref 0.7–4.0)
MCH: 26.9 pg (ref 26.0–34.0)
MCHC: 32.5 g/dL (ref 30.0–36.0)
MCV: 82.5 fL (ref 78.0–100.0)
MONO ABS: 0.4 10*3/uL (ref 0.1–1.0)
MONOS PCT: 19 %
NEUTROS ABS: 1.1 10*3/uL — AB (ref 1.7–7.7)
NEUTROS PCT: 51 %
Platelets: 182 10*3/uL (ref 150–400)
RBC: 3.09 MIL/uL — ABNORMAL LOW (ref 4.22–5.81)
RDW: 16 % — AB (ref 11.5–15.5)
WBC: 2.1 10*3/uL — ABNORMAL LOW (ref 4.0–10.5)

## 2017-07-13 LAB — FUNGAL ORGANISM REFLEX

## 2017-07-13 LAB — MAGNESIUM: Magnesium: 2.1 mg/dL (ref 1.7–2.4)

## 2017-07-13 LAB — FUNGUS CULTURE WITH STAIN

## 2017-07-13 LAB — FUNGUS CULTURE RESULT

## 2017-07-13 LAB — PHOSPHORUS: Phosphorus: 3.4 mg/dL (ref 2.5–4.6)

## 2017-07-13 NOTE — Progress Notes (Signed)
Family Medicine Teaching Service Daily Progress Note Intern Pager: [phone]  Patient name: Kenneth Rivas Medical record number: [id number] Date of birth: [birth year] Age: 56 y.o. Gender: male  Primary Care Provider: Tommy Medal, Lavell Islam, MD Consultants: ID Code Status: FULL  Pt Overview and Major Events to Date:  Admitted on 7/31, CD4 count 2 On 8/1, found to have cryptococcal antigen in serum On 8/3, Transferred to ICU for LP due to risk of intubation/vent, was intubated after transfer On 8/11, Extubated On 8/13, Transferred to [facility] and Plan: Kenneth Rivas a 56 y.o.malepresenting with cachexia and feeling weak and tired x past 4 weeks. PMH is significant for AIDS, hep B, cocaine use, and HTN.   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 On day 26 of amphotericin.  - Repeat LP, open pressure of 9cm, 40 WBC, low protein, nl glucose. Cryptococcal antigen down trending, cultures pending- no growth in 3 days -Per ID, cont amphotericine B and fluconazole- day 8  Left eye ptosis: stable. experiencing left eye ptosis and blurry vision out of left eye; ophthalmologist saw 8/14- 3rd, and mild 4th and 6th nerve palsy will cause double vision- Improving with drops. -cont to monitor   Pneumonia: Pseudomonas on sputum culture. S/p 8 day course of ceftazidime 1g infusion TID, d/c'ed 8/12. On azithromycin 1.2 g weekly and Bactrim DS 800-160 mg daily for prophylaxis due to AIDS with CD4 of 2.   - follow ID recommendations - continue abx prophylaxis  Sputum positive for Acid Fast Bacilli: - Sputum from Bronchoalveolar Lavage on 08/03 positive for acid fast on 08/25.  - Per ID: He has no current clinical or radiographic evidence of pneumonia. The AFB probes showed that the isolate was not M tb or M avium. He has not needed to be isolated and I do not feel that he needs any  treatment for this at this time.- Will discontinue contact and airborne precautions. 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 - Per ID: Will not start antiretroviral therapy due to risk of IRIS. Consider in 5 weeks. - Diet recommendations: Dysphagia 2 (fine chop); Honey-thick liquid - continue to monitor Mag, K, and Phos, replete as needed - Mag of 2.1 s/p 4 g - K 3.7 on 8/27- pharmacy repleted 8/26  HTN: Normotensive. -Continue Norvasc 10 mg -Continue Hydral 5 mg with parameters: SBP>170 or DBP>110  -Monitor blood pressures  Hepatitis B: Chronic. Not currently taking medication and does not follow with Infectious Disease. LFTs with AST 19, ALT 13 and alk phos 58. Normal total bili 0.8 and direct bili 0.2.   -Continue to monitor   H/o drug use and insecure home environment:In ED, UDS +for cocaine and amphetamines. May have contributed to his elevated BP on admission depending on time of ingestion.  -CSW states pt isnt interested in help.  FEN/GI: Dysphagia 2 (fine chop);Honey-thick liquid, s/p NG tube Prophylaxis: Lovenox   Disposition:med-surg  Subjective:  Kenneth Rivas is resting in bed. Says he feels fine.  No complaints.   Objective: Temp:  [97.1 F (36.2 C)-98.9 F (37.2 C)] 98.4 F (36.9 C) (08/27 0545) Pulse Rate:  [72-83] 72 (08/27 0545) Resp:  [16-18] 16 (08/27 0545) BP: (118-126)/(59-76) 125/76 (08/27 0545) SpO2:  [97 %-100 %] 98 % (08/27 0545) Weight:  [119 lb 0.3 oz (54 kg)] 119  lb 0.3 oz (54 kg) (08/27 0545) Physical Exam: General: cachetic male, chronically ill appearing Eye: Left eye ptosis, vision intact when eyelid retracted Cardiovascular: RRR, no MRG Respiratory: ctab, normal work of breathing Abdomen: soft, nontender  Extremities: decreased muscle mass, patches of very dry skin  Laboratory:  Recent Labs Lab 07/11/17 0451 07/12/17 0315  07/13/17 0256  WBC 2.0* 2.0* 2.1*  HGB 8.8* 8.7* 8.3*  HCT 26.6* 26.4* 25.5*  PLT 231 202 182    Recent Labs Lab 07/12/17 0315 07/12/17 1654 07/13/17 0256  NA 135 136 133*  K 3.0* 3.7 3.7  CL 105 105 105  CO2 _0 BUN _1 CREATININE 0.65 0.73 0.65  CALCIUM 8.4* 8.6* 8.4*  GLUCOSE 95 93 96   Phos 3.4, Mag 2.1  Imaging/Diagnostic Tests: Ct Chest W Contrast  Result Date: 06/17/2017 CLINICAL DATA:  Shortness of breath. EXAM: CT CHEST WITH CONTRAST TECHNIQUE: Multidetector CT imaging of the chest was performed during intravenous contrast administration. CONTRAST:  63m ISOVUE-300 IOPAMIDOL (ISOVUE-300) INJECTION 61% COMPARISON:  Radiographs of June 16, 2017. FINDINGS: Cardiovascular: Normal heart size. No pericardial effusion. Atherosclerosis of thoracic aorta is noted. No dissection or aneurysm is noted. Mediastinum/Nodes: No enlarged mediastinal, hilar, or axillary lymph nodes. Thyroid gland, trachea, and esophagus demonstrate no significant findings. Lungs/Pleura: No pneumothorax or pleural effusion is noted. 3 mm nodule is noted in left upper lobe best seen on image number 54 of series 4. 7 mm cavitating abnormality is noted in the right middle lobe best seen on image number 104 series 4. Upper Abdomen: No acute abnormality. Musculoskeletal: No chest wall abnormality. No acute or significant osseous findings. IMPRESSION: 7 mm cavitary lesion is seen in the right middle lobe. It is uncertain if this represents cavitary metastatic lesion or small pulmonary abscess. Clinical correlation is recommended. Follow-up CT scan in 6-8 weeks is recommended to determine stability or resolution. Aortic Atherosclerosis (ICD10-I70.0). Electronically Signed   By: JMarijo Conception M.D.   On: 06/17/2017 12:04   Dg Chest Portable 1 View  Result Date: 06/16/2017 CLINICAL DATA:  Three weeks of weakness, anorexia, emaciation. History of hepatitis-B, HIV-AIDS. EXAM: PORTABLE CHEST 1 VIEW COMPARISON:   Chest x-ray of June 11, 2017 FINDINGS: The lungs are hyperinflated. There is no focal infiltrate. There are subtle nodules of varying sizes up to 3 mm in diameter in both lungs. Some likely reflect pulmonary vessels on the end but others are more peripheral. The heart and pulmonary vascularity are normal. The mediastinum is normal in width. There is no pleural effusion. IMPRESSION: No classic alveolar pneumonia. No CHF. Tiny pulmonary parenchymal nodules are suspected bilaterally and may reflect infectious or neoplastic processes. Chest CT scanning is recommended. Electronically Signed   By: David  JMartiniqueM.D.   On: 06/16/2017 16:45   Dg Chest Port 1 View  Result Date: 07/11/2017 CLINICAL DATA:  Screening for pulmonary TB. EXAM: PORTABLE CHEST 1 VIEW COMPARISON:  06/26/2017 FINDINGS: Both lungs are clear. Heart and mediastinum are within normal limits. The trachea is midline. No acute bone abnormality. Negative for a pneumothorax. Endotracheal tube and nasogastric tube have been removed since the prior examination. IMPRESSION: No acute cardiopulmonary disease. No radiographic findings that are specific for TB. Electronically Signed   By: AMarkus DaftM.D.   On: 07/11/2017 14:43    Lamica Mccart, JMartinique DO 07/13/2017, 6:32 AM PGY-1, CWare ShoalsIntern pager: [phone] text pages welcome

## 2017-07-13 NOTE — Progress Notes (Signed)
MEDICATION RELATED CONSULT NOTE - FOLLOW UP   Pharmacy Consult for Potassium & Magnesium replacement while on Amphotericin-B  No Active Allergies  Patient Measurements: Height: 5\' 3"  (160 cm) Weight: 119 lb 0.3 oz (54 kg) IBW/kg (Calculated) : 56.9   Vital Signs: Temp: 98.4 F (36.9 C) (08/27 0545) Temp Source: Oral (08/26 2237) BP: 125/76 (08/27 0545) Pulse Rate: 72 (08/27 0545) Intake/Output from previous day: 08/26 0701 - 08/27 0700 In: 3420.5 [P.O.:440; I.V.:380.5; IV Piggyback:2600] Out: 4200 [Urine:4200] Intake/Output from this shift: No intake/output data recorded.  Labs:  Recent Labs  07/11/17 0451 07/12/17 0315 07/12/17 1654 07/13/17 0256  WBC 2.0* 2.0*  --  2.1*  HGB 8.8* 8.7*  --  8.3*  HCT 26.6* 26.4*  --  25.5*  PLT 231 202  --  182  CREATININE 0.63 0.65 0.73 0.65  MG 1.5* 1.5* 1.8 2.1  PHOS 3.6 3.7  --  3.4   Estimated Creatinine Clearance: 76.9 mL/min (by C-G formula based on SCr of 0.65 mg/dL).   Medications:  Scheduled:  . amLODipine  10 mg Oral Daily  . azithromycin  1,200 mg Oral Weekly  . dextrose  10 mL Intravenous Q24H  . dextrose  10 mL Intravenous Q24H  . enoxaparin (LOVENOX) injection  30 mg Subcutaneous Q24H  . feeding supplement (ENSURE ENLIVE)  237 mL Oral BID BM  . feeding supplement (PRO-STAT SUGAR FREE 64)  30 mL Oral Daily  . fluconazole  800 mg Oral Daily  . multivitamin with minerals  1 tablet Oral Daily  . pantoprazole sodium  40 mg Oral Daily  . polyvinyl alcohol  1 drop Both Eyes QHS  . sennosides  5 mL Oral QHS  . sulfamethoxazole-trimethoprim  1 tablet Oral Daily    Assessment: Mg at goal and K remains < goal but wnl..  Pt received Mg 2g at 1AM today, and received a total of 80 mEq of K on 8/26.  Cr is stable and pt with good UOP.    Total supps over last 6 days: Mg- 12g IV K- 220 mEq total (160 mEq po and 60 mEq IV)  Plan:  K-Dur po x 1, then start bid MgOx 400mg  BID Daily BMet, Mg   Marisue Humble, B.S., PharmD Clinical Pharmacist [facility]

## 2017-07-13 NOTE — Progress Notes (Signed)
Physical Therapy Treatment Patient Details Name: Kenneth Rivas MRN: [id number] DOB: 02-24-1961 Today's Date: 07/13/2017    History of Present Illness 56 y.o. 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 Admitted on 7/31 w/ cc: weakness and FTT. Admitting dx: FTT and cachexia d/t AIDs, pulmonary nodules, bedbugs, and HTN. Had CT chest showing cavitary lung lesion in the RML. 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 Intubated 8/3-8/11.    PT Comments    Pt performed increased gait and functional mobility during session. Pt remains unsteady with gait sequencing and required assist to correct pattern.  Pt impulsive and lacks safety with transfers.  Pt will continue to benefit from SNF placement at d/c to improve strength and endurance before returning to private residence.     Follow Up Recommendations  SNF;Supervision/Assistance - 24 hour     Equipment Recommendations  Other (comment) (TBD at next venue)    Recommendations for Other Services       Precautions / Restrictions Precautions Precautions: Fall Restrictions Weight Bearing Restrictions: No    Mobility  Bed Mobility Overal bed mobility: Modified Independent Bed Mobility: Supine to Sit           General bed mobility comments: No assistance needed.  + rail use.    Transfers Overall transfer level: Needs assistance Equipment used: Rolling walker (2 wheeled) Transfers: Sit to/from Stand Sit to Stand: Min guard         General transfer comment: Cues remain for hand placement to and from seated surface.  Cues to keep device close and back entirely to surface before sitting.  Pt slow to ascend.    Ambulation/Gait Ambulation/Gait assistance: Min guard Ambulation Distance (Feet): 180 Feet Assistive device: Rolling walker (2 wheeled) Gait Pattern/deviations: Step-through  pattern;Scissoring;Decreased stride length;Trunk flexed Gait velocity: slower Gait velocity interpretation: Below normal speed for age/gender General Gait Details: Pt remains unsteady with gait with scissoring noted on LLE crosssing midline consistently.  Cues to increased BOS and maintain forward gaze.     Stairs            Wheelchair Mobility    Modified Rankin (Stroke Patients Only)       Balance Overall balance assessment: Needs assistance   Sitting balance-Leahy Scale: Fair       Standing balance-Leahy Scale: Poor                              Cognition Arousal/Alertness: Awake/alert Behavior During Therapy: WFL for tasks assessed/performed Overall Cognitive Status: Within Functional Limits for tasks assessed                                        Exercises      General Comments        Pertinent Vitals/Pain Pain Assessment: No/denies pain Faces Pain Scale: No hurt    Home Living                      Prior Function            PT Goals (current goals can now be found in the care plan section) Acute Rehab PT Goals Patient Stated Goal: to get around better and have more energy Potential to Achieve Goals: Fair Progress towards  PT goals: Progressing toward goals    Frequency    Min 3X/week      PT Plan Current plan remains appropriate    Co-evaluation              AM-PAC PT "6 Clicks" Daily Activity  Outcome Measure  Difficulty turning over in bed (including adjusting bedclothes, sheets and blankets)?: None Difficulty moving from lying on back to sitting on the side of the bed? : None Difficulty sitting down on and standing up from a chair with arms (e.g., wheelchair, bedside commode, etc,.)?: A Lot Help needed moving to and from a bed to chair (including a wheelchair)?: A Little Help needed walking in hospital room?: A Little Help needed climbing 3-5 steps with a railing? : A Little 6 Click  Score: 19    End of Session Equipment Utilized During Treatment: Gait belt Activity Tolerance: Patient limited by fatigue Patient left:  (Pt sitting on commode, requesting to sit for extended period, Nursing informed and reports they will check in to assist him from commode back to bed.  ) Nurse Communication: Mobility status (informed nursing patient on BSC at conclusion of tx.  ) PT Visit Diagnosis: Unsteadiness on feet (R26.81);Other abnormalities of gait and mobility (R26.89);Muscle weakness (generalized) (M62.81) Pain - part of body:  (generalized)     Time: 1208-1228 PT Time Calculation (min) (ACUTE ONLY): 20 min  Charges:  $Gait Training: 8-22 mins                    G Codes:       Joycelyn Rua, PTA pager [phone]    Florestine Avers 07/13/2017, 12:40 PM

## 2017-07-13 NOTE — Progress Notes (Addendum)
Patient ID: Kenneth Rivas, male   DOB: [birth year], 58 y.o.   MRN: [id number]          [facility] for Infectious Disease  Date of Admission:  06/16/2017           Day 26 amphotericin        Day 8 fluconazole ASSESSMENT: Unfortunately he has not had any significant improvement in his left ptosis but otherwise he has improved. His last CSF culture is negative and final so I will stop amphotericin and plan on continuing consolidation therapy with high-dose fluconazole for 8 more weeks through 10/08/2017 before reducing the dose of fluconazole to maintenance levels. He needs to stay on prophylactic doses of azithromycin and trimethoprim sulfamethoxazole. I will see him back in clinic in about 4 weeks to consider starting antiretroviral therapy.  PLAN: 1. Discontinue amphotericin 2. Continue oral fluconazole 800 mg daily 3. Continue prophylactic azithromycin and trimethoprim sulfamethoxazole 4. I will arrange follow-up in my clinic in 3-4 weeks 5. 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 amLODipine  10 mg Oral Daily  . azithromycin  1,200 mg Oral Weekly  . dextrose  10 mL Intravenous Q24H  . dextrose  10 mL Intravenous Q24H  . enoxaparin (LOVENOX) injection  30 mg Subcutaneous Q24H  . feeding supplement (ENSURE ENLIVE)  237 mL Oral BID BM  . feeding supplement (PRO-STAT SUGAR FREE 64)  30 mL Oral Daily  .  fluconazole  800 mg Oral Daily  . multivitamin with minerals  1 tablet Oral Daily  . pantoprazole sodium  40 mg Oral Daily  . polyvinyl alcohol  1 drop Both Eyes QHS  . sennosides  5 mL Oral QHS  . sulfamethoxazole-trimethoprim  1 tablet Oral Daily    SUBJECTIVE: He denies headache. He states that he is feeling about the same.  Review of Systems: Review of Systems  Constitutional: Negative for chills, diaphoresis and fever.  Gastrointestinal: Negative for abdominal pain, diarrhea, nausea and vomiting.  Neurological: Negative for headaches.    No Active Allergies  OBJECTIVE: Vitals:   07/12/17 2237 07/13/17 0545 07/13/17 1000 07/13/17 1409  BP: (!) 118/59 125/76 113/68 118/66  Pulse: 79 72  73  Resp: 18 16    Temp: 98.9 F (37.2 C) 98.4 F (36.9 C)  98.6 F (37 C)  TempSrc: Oral   Oral  SpO2: 97% 98%  99%  Weight:  119 lb 0.3 oz (54 kg)    Height:       Body mass index is 21.08 kg/m.  Physical Exam  Constitutional: He is oriented to person, place, and time.  Alert and in no distress.  HENT:  No change in left ptosis.  Cardiovascular: Normal rate and regular rhythm.   No murmur heard. Pulmonary/Chest: Effort normal and breath sounds normal.  Neurological: He is alert and oriented to person, place, and time.  Psychiatric: Mood and affect normal.    Lab Results Lab Results  Component Value Date   WBC 2.1 (L) 07/13/2017   HGB 8.3 (L) 07/13/2017   HCT 25.5 (L) 07/13/2017   MCV 82.5 07/13/2017   PLT 182 07/13/2017    Lab Results  Component Value Date   CREATININE 0.65 07/13/2017   BUN 11 07/13/2017   NA 133 (L) 07/13/2017   K 3.7 07/13/2017   CL 105 07/13/2017   CO2 25 07/13/2017    Lab Results  Component Value Date   ALT 47 06/27/2017   AST 53 (H) 06/27/2017   ALKPHOS 77 06/27/2017   BILITOT 0.2 (L) 06/27/2017     Microbiology: Recent Results (from the past 240 hour(s))  CSF culture     Status: None (Preliminary result)   Collection Time:  07/08/17  1:35 PM  Result Value Ref Range Status   Specimen Description CSF  Final   Special Requests NONE  Final   Gram Stain   Final    CYTOSPIN SMEAR WBC PRESENT, PREDOMINANTLY MONONUCLEAR YEAST CRITICAL RESULT CALLED TO, READ BACK BY AND VERIFIED WITH: S JONES AT 1443 ON [id number] BY SJW    Culture NO GROWTH 5 DAYS  Final   Report Status PENDING  Incomplete  Fungus Culture With Stain     Status: None (Preliminary result)   Collection Time: 07/08/17  1:35 PM  Result Value Ref Range Status   Fungus Stain Final report  Final    Comment: (NOTE) Performed At: [facility] [address] [place], [place] [id number] Mila Homer MD YQ:[id number]    Fungus (Mycology) Culture PENDING  Incomplete   Fungal Source CSF  Final  Fungus Culture Result     Status: None   Collection Time: 07/08/17  1:35 PM  Result Value Ref Range Status   Result 1 Comment  Final    Comment: (NOTE) KOH/Calcofluor preparation:  no fungus observed. Performed At: [facility] [address] [place], [place] [id number] Mila Homer MD RJ:[id number]     [name], MD [facility] [phone] pager   [phone] cell 07/13/2017, 3:10 PM

## 2017-07-13 NOTE — Care Management Note (Signed)
Case Management Note  Patient Details  Name: Kenneth Rivas MRN: [id number] Date of Birth: 02/09/1961  Subjective/Objective:                    Action/Plan:  See social work note.   MATCH letter provided , patient voiced understanding.   Patient has a non Medicaid qualifying DX for HHPT , therefore unable to arrange home health PT , [facility] unable to provide [facility] [place] , unable to arrange [facility] Expected Discharge Date:  06/19/17               Expected Discharge Plan:  Home/Self Care  In-House Referral:     Discharge planning Services  CM Consult, Indigent Health Clinic, [facility] Program, Medication Assistance  Post Acute Care Choice:  Durable Medical Equipment Choice offered to:  Patient  DME Arranged:  3-N-1, Shower stool DME Agency:  [facility]  HH Arranged:    [facility] Agency:     Status of Service:  In process, will continue to follow  If discussed at Long Length of Stay Meetings, dates discussed:    Additional Comments:  Kingsley Plan, RN 07/13/2017, 3:49 PM

## 2017-07-14 LAB — BASIC METABOLIC PANEL
ANION GAP: 8 (ref 5–15)
BUN: 12 mg/dL (ref 6–20)
CALCIUM: 8.5 mg/dL — AB (ref 8.9–10.3)
CO2: 21 mmol/L — ABNORMAL LOW (ref 22–32)
Chloride: 105 mmol/L (ref 101–111)
Creatinine, Ser: 0.59 mg/dL — ABNORMAL LOW (ref 0.61–1.24)
Glucose, Bld: 77 mg/dL (ref 65–99)
Potassium: 3.7 mmol/L (ref 3.5–5.1)
SODIUM: 134 mmol/L — AB (ref 135–145)

## 2017-07-14 LAB — CBC WITH DIFFERENTIAL/PLATELET
Band Neutrophils: 0 %
Basophils Absolute: 0 K/uL (ref 0.0–0.1)
Basophils Relative: 0 %
Blasts: 0 %
Eosinophils Absolute: 0.2 K/uL (ref 0.0–0.7)
Eosinophils Relative: 10 %
HCT: 27.2 % — ABNORMAL LOW (ref 39.0–52.0)
Hemoglobin: 8.7 g/dL — ABNORMAL LOW (ref 13.0–17.0)
Lymphocytes Relative: 25 %
Lymphs Abs: 0.5 K/uL — ABNORMAL LOW (ref 0.7–4.0)
MCH: 26.3 pg (ref 26.0–34.0)
MCHC: 32 g/dL (ref 30.0–36.0)
MCV: 82.2 fL (ref 78.0–100.0)
Metamyelocytes Relative: 1 %
Monocytes Absolute: 0.2 K/uL (ref 0.1–1.0)
Monocytes Relative: 10 %
Myelocytes: 0 %
Neutro Abs: 1.2 K/uL — ABNORMAL LOW (ref 1.7–7.7)
Neutrophils Relative %: 54 %
Other: 0 %
Platelets: 219 K/uL (ref 150–400)
Promyelocytes Absolute: 0 %
RBC: 3.31 MIL/uL — ABNORMAL LOW (ref 4.22–5.81)
RDW: 15.9 % — ABNORMAL HIGH (ref 11.5–15.5)
WBC: 2.1 K/uL — ABNORMAL LOW (ref 4.0–10.5)
nRBC: 0 /100{WBCs}

## 2017-07-14 MED ORDER — SULFAMETHOXAZOLE-TRIMETHOPRIM 800-160 MG PO TABS: 1.0000 | ORAL_TABLET | Freq: Every day | ORAL | 0 refills | Status: DC

## 2017-07-14 MED ORDER — FLUCONAZOLE 200 MG PO TABS: 800.0000 mg | ORAL_TABLET | Freq: Every day | ORAL | 0 refills | Status: AC

## 2017-07-14 MED ORDER — ENSURE ENLIVE PO LIQD: 237.0000 mL | Freq: Two times a day (BID) | ORAL | 12 refills | Status: DC

## 2017-07-14 MED ORDER — AMLODIPINE BESYLATE 10 MG PO TABS: 10.0000 mg | ORAL_TABLET | Freq: Every day | ORAL | 0 refills | Status: DC

## 2017-07-14 MED ORDER — FLUCONAZOLE 200 MG PO TABS: 800.0000 mg | ORAL_TABLET | Freq: Every day | ORAL | 0 refills | Status: DC

## 2017-07-14 MED ORDER — AZITHROMYCIN 600 MG PO TABS: 1200.0000 mg | ORAL_TABLET | ORAL | 0 refills | Status: DC

## 2017-07-14 MED ORDER — POLYVINYL ALCOHOL 1.4 % OP SOLN: 1.0000 [drp] | Freq: Every day | OPHTHALMIC | 0 refills | Status: AC

## 2017-07-14 NOTE — Progress Notes (Signed)
Family Medicine Teaching Service Daily Progress Note Intern Pager: [phone]  Patient name: Kenneth Rivas Medical record number: [id number] Date of birth: June 28, 1961 Age: 56 y.o. Gender: male  Primary Care Provider: Tommy Medal, Lavell Islam, MD Consultants: ID Code Status: FULL  Pt Overview and Major Events to Date:  Admitted on 7/31, CD4 count 2 On 8/1, found to have cryptococcal antigen in serum On 8/3, Transferred to ICU for LP due to risk of intubation/vent, was intubated after transfer On 8/11, Extubated On 8/13, Transferred to [place] and Plan: Kenneth Rivas a 56 y.o.malepresenting with cachexia and feeling weak and tired x past 4 weeks. PMH is significant for AIDS, hep B, cocaine use, and HTN.   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 - Repeat LP, open pressure of 9cm, 40 WBC, low protein, nl glucose. Cryptococcal antigen down trending, cultures with no growth- amphotericin stopped by ID -Per ID, cont fluconazole- day 9  Left eye ptosis: stable. experiencing left eye ptosis and blurry vision out of left eye; ophthalmologist saw 8/14- 3rd, and mild 4th and 6th nerve palsy will cause double vision- Improving with drops. -cont to monitor   Pneumonia: Pseudomonas on sputum culture. S/p 8 day course of ceftazidime 1g infusion TID, d/c'ed 8/12. On azithromycin 1.2 g weekly and Bactrim DS 800-160 mg daily for prophylaxis due to AIDS with CD4 of 2.   - follow ID recommendations - continue abx prophylaxis  Sputum positive for Acid Fast Bacilli: - Sputum from Bronchoalveolar Lavage on 08/03 positive for acid fast on 08/25.  - Per ID: He has no current clinical or radiographic evidence of pneumonia. The AFB probes showed that the isolate was not M tb or M avium. He has not needed to be isolated and I do not feel that he needs any treatment for this at this time.-  Will discontinue contact and airborne precautions. 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 - Per ID: Will not start antiretroviral therapy due to risk of IRIS. Consider in 5 weeks. - Diet recommendations: Dysphagia 2 (fine chop); Honey-thick liquid - continue to monitor Mag, K, and Phos, replete as needed - Mag of 2.1 s/p 4 g - K 3.7 on 8/27- pharmacy repleted 8/26  HTN: Normotensive. -Continue Norvasc 10 mg -Continue Hydral 5 mg with parameters: SBP>170 or DBP>110  -Monitor blood pressures  Hepatitis B: Chronic. Not currently taking medication and does not follow with Infectious Disease. LFTs with AST 19, ALT 13 and alk phos 58. Normal total bili 0.8 and direct bili 0.2.   -Continue to monitor   H/o drug use and insecure home environment:In ED, UDS +for cocaine and amphetamines. May have contributed to his elevated BP on admission depending on time of ingestion.  -CSW states pt isnt interested in help.  FEN/GI: Dysphagia 2 (fine chop);Honey-thick liquid, s/p NG tube Prophylaxis: Lovenox   Disposition:med-surg  Subjective:  [name] is resting in bed. Says he feels fine.  No complaints. Has  Safe home environment and says his roommate will take him to follow ups.  Objective: Temp:  [98.2 F (36.8 C)-98.8 F (37.1 C)] 98.2 F (36.8 C) (08/28 0408) Pulse Rate:  [73-86] 73 (08/28 0408) Resp:  [17] 17 (08/28 0408) BP: (113-123)/(60-77) 123/77 (08/28 0408) SpO2:  [98 %-100 %] 100 % (08/28 0408) Weight:  [546  lb 14.4 oz (51.2 kg)] 112 lb 14.4 oz (51.2 kg) (08/28 0408) Physical Exam: General: cachetic male, chronically ill appearing Eye: Left eye ptosis, vision intact when eyelid retracted Cardiovascular: RRR, no MRG Respiratory: ctab, normal work of breathing Abdomen: soft, nontender  Extremities: decreased muscle mass, patches of very dry  skin  Laboratory:  Recent Labs Lab 07/11/17 0451 07/12/17 0315 07/13/17 0256  WBC 2.0* 2.0* 2.1*  HGB 8.8* 8.7* 8.3*  HCT 26.6* 26.4* 25.5*  PLT 231 202 182    Recent Labs Lab 07/12/17 0315 07/12/17 1654 07/13/17 0256  NA 135 136 133*  K 3.0* 3.7 3.7  CL 105 105 105  CO2 _0 BUN _1 CREATININE 0.65 0.73 0.65  CALCIUM 8.4* 8.6* 8.4*  GLUCOSE 95 93 96   Phos 3.4, Mag 2.1  Imaging/Diagnostic Tests: Ct Chest W Contrast  Result Date: 06/17/2017 CLINICAL DATA:  Shortness of breath. EXAM: CT CHEST WITH CONTRAST TECHNIQUE: Multidetector CT imaging of the chest was performed during intravenous contrast administration. CONTRAST:  54m ISOVUE-300 IOPAMIDOL (ISOVUE-300) INJECTION 61% COMPARISON:  Radiographs of June 16, 2017. FINDINGS: Cardiovascular: Normal heart size. No pericardial effusion. Atherosclerosis of thoracic aorta is noted. No dissection or aneurysm is noted. Mediastinum/Nodes: No enlarged mediastinal, hilar, or axillary lymph nodes. Thyroid gland, trachea, and esophagus demonstrate no significant findings. Lungs/Pleura: No pneumothorax or pleural effusion is noted. 3 mm nodule is noted in left upper lobe best seen on image number 54 of series 4. 7 mm cavitating abnormality is noted in the right middle lobe best seen on image number 104 series 4. Upper Abdomen: No acute abnormality. Musculoskeletal: No chest wall abnormality. No acute or significant osseous findings. IMPRESSION: 7 mm cavitary lesion is seen in the right middle lobe. It is uncertain if this represents cavitary metastatic lesion or small pulmonary abscess. Clinical correlation is recommended. Follow-up CT scan in 6-8 weeks is recommended to determine stability or resolution. Aortic Atherosclerosis (ICD10-I70.0). Electronically Signed   By: JMarijo Conception M.D.   On: 06/17/2017 12:04   Dg Chest Portable 1 View  Result Date: 06/16/2017 CLINICAL DATA:  Three weeks of weakness, anorexia, emaciation.  History of hepatitis-B, HIV-AIDS. EXAM: PORTABLE CHEST 1 VIEW COMPARISON:  Chest x-ray of June 11, 2017 FINDINGS: The lungs are hyperinflated. There is no focal infiltrate. There are subtle nodules of varying sizes up to 3 mm in diameter in both lungs. Some likely reflect pulmonary vessels on the end but others are more peripheral. The heart and pulmonary vascularity are normal. The mediastinum is normal in width. There is no pleural effusion. IMPRESSION: No classic alveolar pneumonia. No CHF. Tiny pulmonary parenchymal nodules are suspected bilaterally and may reflect infectious or neoplastic processes. Chest CT scanning is recommended. Electronically Signed   By: David  JMartiniqueM.D.   On: 06/16/2017 16:45   No results found.  Jaidah Lomax, JMartinique DO 07/14/2017, 6:44 AM PGY-1, [place] pager: [phone] text pages welcome

## 2017-07-14 NOTE — Progress Notes (Signed)
FPTS Interim Progress Note  S:PAged by nursing on the floor, Kenneth Rivas had been discharged but had not left yet and while nursing was out of the room he went to the bathroom.   He was found sitting on the floor in front of the toilet and said he had just slipped while wiping.   Nursing thought his positioning was consistent with that story and noticed no injury.  He was not complaining of any pain and was able to walk back to his seat. I said I would come up immediately and evaluate Kenneth Rivas.  Kenneth Rivas said he felt fine and had slipped because of the socks.   He further stated that he would have gotten up on his own but the nurse showed up right after he fell.   He claimed no pain and did not hit his head.  O: No visible injuries, he was able to stand without assistance or difficulty using his walker.  He was able to bend both legs at the hip and support his weight with both legs individually.   All hip/femur/sacrum bony prominences were palpated and he expressed no pain.  BP 121/81 (BP Location: Left Arm)   Pulse 82   Temp 98.4 F (36.9 C) (Oral)   Resp 18   Ht 5\' 3"  (1.6 m)   Wt 112 lb 14.4 oz (51.2 kg)   SpO2 100%   BMI 20.00 kg/m     A/P: Given a "slide" to the ground from the height of a toilet seat and his very reassuring presentation and physical exam, He is safe to continue as a discharge.  Kenneth Rolling, DO 07/14/2017, 7:31 PM PGY-1, [facility] Family Medicine Service pager [phone]

## 2017-07-14 NOTE — Progress Notes (Signed)
1800 Pt is sitting in the chair waiting for family to pick him up. Discharge instructions was given to the pt, verbalized understanding. Pt' sister Aggie Cosier was notified of discharge and is coming to pick pt up.  1910 Found pt sitting up on the bathroom floor by the toilet. Did not use the call light for help. Assisted pt to stand up, no injury noted. Family medicine came in to assess pt. Pt's family came in for discharge. Discharged pt home accompanied by neice.

## 2017-07-14 NOTE — Discharge Instructions (Signed)
You were admitted to the hospital with meningitis. This was due to an infection of the membranes surrounding your brain and spinal cord. This has resolved now. You still have yeast in these membranes and will need to continue taking fluconazole to treat this. You will need to follow up with infectious disease, Dr. Daiva Eves in 3 weeks. You were also found have pneumonia. We have given you a prescription for a weekly antibiotic and an antibiotic to take daily to ensure that you do not have an infection again.  You will need to follow up with a primary care doctor regarding your labs and high blood pressure. We have sent you home with a walker, bedside commode, and tub chair. Please follow up closely with all of your doctors.

## 2017-07-15 LAB — CULTURE, FUNGUS WITHOUT SMEAR

## 2017-07-15 LAB — CSF CULTURE W GRAM STAIN: Culture: NO GROWTH

## 2017-07-15 LAB — CSF CULTURE

## 2017-07-27 ENCOUNTER — Telehealth: Payer: Self-pay | Admitting: Infectious Disease

## 2017-07-27 NOTE — Telephone Encounter (Signed)
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 Significance of this organism at this site is questionable. He certainly needs to be seen in our clinic for follow-up.

## 2017-07-28 ENCOUNTER — Telehealth: Payer: Self-pay | Admitting: *Deleted

## 2017-07-28 NOTE — Telephone Encounter (Signed)
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 I would ask [name] to find out what is going on. Kenneth Rivas is SUPER vulnerable right now and if he doesn't do things correctly his chance of rehosptilization and also of permanent brain ddamage and death are HIGH

## 2017-07-28 NOTE — Telephone Encounter (Signed)
RN attempted to call patient, but the call could not be completed to the number listed.  RN called patient's emergency contact, sister Kenneth Rivas. She stated he refused SNF placement, was discharged back home. He does not currently have a phone. She does not go to his house, stating she cannot go up the steps and she does not feel safe there. She will call a friend, Kenneth Rivas, to check on Kenneth Rivas and make sure he knows about his appointment 9/26 at 2:30.  She is unsure if he is able to get medication after discharge, will make sure he contacts us.  Andree CossHowell, Michelle M, RN   Per Dr Daiva EvesVan Dam: Just want to make sure his SNF know of time and date of [facility] appt with me and to make sure he is doing ok. He grew a rapid grower from lungs that is probably NOT signficant

## 2017-07-29 NOTE — Telephone Encounter (Signed)
Kenneth Rivas [name] the referral.

## 2017-07-29 NOTE — Telephone Encounter (Signed)
Perfect

## 2017-07-31 LAB — ACID FAST CULTURE WITH REFLEXED SENSITIVITIES (MYCOBACTERIA): Acid Fast Culture: NEGATIVE

## 2017-08-04 LAB — ACID FAST CULTURE WITH REFLEXED SENSITIVITIES: ACID FAST CULTURE - AFSCU3: NEGATIVE

## 2017-08-04 LAB — ACID FAST CULTURE WITH REFLEXED SENSITIVITIES (MYCOBACTERIA): Acid Fast Culture: NEGATIVE

## 2017-08-06 LAB — FUNGUS CULTURE RESULT

## 2017-08-06 LAB — FUNGUS CULTURE WITH STAIN

## 2017-08-06 LAB — FUNGAL ORGANISM REFLEX

## 2017-08-07 LAB — RAPID GROWER BROTH SUSCEP.
Moxifloxacin: 1
Tigecycline: 0.06

## 2017-08-07 LAB — AFB ORGANISM ID BY DNA PROBE
M avium complex: NEGATIVE
M tuberculosis complex: NEGATIVE

## 2017-08-07 LAB — ACID FAST CULTURE WITH REFLEXED SENSITIVITIES (MYCOBACTERIA): Acid Fast Culture: POSITIVE — AB

## 2017-08-07 LAB — ORG ID BY SEQUENCING RFLX AST

## 2017-08-11 LAB — ACID FAST CULTURE WITH REFLEXED SENSITIVITIES: ACID FAST CULTURE - AFSCU3: NEGATIVE

## 2017-08-12 ENCOUNTER — Ambulatory Visit: Payer: Self-pay | Admitting: Infectious Disease

## 2017-08-17 ENCOUNTER — Other Ambulatory Visit: Payer: Self-pay

## 2017-08-17 ENCOUNTER — Ambulatory Visit (INDEPENDENT_AMBULATORY_CARE_PROVIDER_SITE_OTHER): Payer: Self-pay

## 2017-08-17 DIAGNOSIS — B20 Human immunodeficiency virus [HIV] disease: Secondary | ICD-10-CM

## 2017-08-17 DIAGNOSIS — Z23 Encounter for immunization: Secondary | ICD-10-CM

## 2017-08-18 ENCOUNTER — Encounter: Payer: Self-pay | Admitting: Infectious Disease

## 2017-09-09 ENCOUNTER — Ambulatory Visit (INDEPENDENT_AMBULATORY_CARE_PROVIDER_SITE_OTHER): Payer: Self-pay | Admitting: Infectious Disease

## 2017-09-09 ENCOUNTER — Encounter: Payer: Self-pay | Admitting: Infectious Disease

## 2017-09-09 VITALS — BP 134/87 | HR 90 | Temp 98.4°F | Ht 63.0 in | Wt 127.0 lb

## 2017-09-09 DIAGNOSIS — H538 Other visual disturbances: Secondary | ICD-10-CM

## 2017-09-09 DIAGNOSIS — B451 Cerebral cryptococcosis: Secondary | ICD-10-CM

## 2017-09-09 DIAGNOSIS — E43 Unspecified severe protein-calorie malnutrition: Secondary | ICD-10-CM

## 2017-09-09 DIAGNOSIS — R64 Cachexia: Secondary | ICD-10-CM

## 2017-09-09 DIAGNOSIS — G529 Cranial nerve disorder, unspecified: Secondary | ICD-10-CM

## 2017-09-09 DIAGNOSIS — H02402 Unspecified ptosis of left eyelid: Secondary | ICD-10-CM

## 2017-09-09 DIAGNOSIS — B20 Human immunodeficiency virus [HIV] disease: Secondary | ICD-10-CM

## 2017-09-09 LAB — CRYPTOCOCCAL ANTIGEN
CRYPTO AG: POSITIVE — AB
Cryptococcal Ag Titer: >=2560

## 2017-09-09 MED ORDER — SULFAMETHOXAZOLE-TRIMETHOPRIM 800-160 MG PO TABS: 1.0000 | ORAL_TABLET | Freq: Every day | ORAL | 11 refills | Status: DC

## 2017-09-09 MED ORDER — FLUCONAZOLE 200 MG PO TABS: 800.0000 mg | ORAL_TABLET | Freq: Every day | ORAL | 5 refills | Status: DC

## 2017-09-09 NOTE — Progress Notes (Signed)
HPI: Kenneth Rivas is a 56 y.o. 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   Allergies: No Known Allergies  Vitals: Temp: 98.4 F (36.9 C) (10/24 1040) Temp Source: Oral (10/24 1040) BP: 134/87 (10/24 1040) Pulse Rate: 90 (10/24 1040)  Past Medical History: Past Medical History:  Diagnosis Date  . AIDS (HCC)   . Anemia   . Anxiety   . Cellulitis of right hand 06/23/2017  . Hepatitis B   . HIV disease (HCC)   . Hypertension   . Immune deficiency disorder [facility](HCC)     Social History: Social History   Social History  . Marital status: Married    Spouse name: N/A  . Number of children: N/A  . Years of education: N/A   Social History Main Topics  . Smoking status: Current Some Day Smoker    Packs/day: 0.30    Types: Cigarettes  . Smokeless tobacco: Never Used     Comment: pt. given quit line information  . Alcohol use 2.0 oz/week    4 Standard drinks or equivalent per week     Comment: beer  . Drug use: Yes    Types: Cocaine     Comment: crack- last use 03/2012. no h/o IVDU, other illicits  . Sexual activity: Yes    Partners: Male     Comment: patient declined condoms   Other Topics Concern  . Not on file   Social History Narrative  . No narrative on file    Previous Regimen: DRV/r+TRV  Current Regimen: None  Labs: HIV 1 RNA Quant (copies/mL)  Date Value  06/16/2017 8,010  01/15/2015 14,034 (H)  11/01/2014 11,896 (H)   CD4 T Cell Abs (/uL)  Date Value  06/16/2017 10 (L)  06/11/2017 <10 (L)  01/15/2015 <10 (L)   Hep B S Ab (no units)  Date Value  01/11/2007 NO   Hepatitis B Surface Ag (no units)  Date Value  01/11/2007 NO   HCV Ab (no units)  Date Value  01/11/2007 NO    CrCl: CrCl cannot be calculated (Patient's most recent lab result is older than the maximum 21 days allowed.).  Lipids:    Component Value Date/Time   CHOL 152 09/16/2010 2022   TRIG 57 09/16/2010 2022   HDL 48 09/16/2010 2022   CHOLHDL 3.2 Ratio 09/16/2010 2022   VLDL 11 09/16/2010 2022   LDLCALC 93 09/16/2010 2022    Assessment: Kenneth Rivas is here to see Dr. Daiva EvesVan Dam for his HIV and cryptococcus meningitis follow up. Apparently, he was discharged without meds, therefore, he is not on anything right now. Dr. Daiva EvesVan Dam is planning on doing the induction therapy with fluconazole again then reduce dose for the consolidation phase. Confirmed with Kenneth Rivas that his ADAP has been approved. Kenneth Rivas will take him to the pharmacy today to pick up the meds and bring him back here so we can set up some pill boxes for him. He will need a lot of hand holding. All ART will still be on hold.   Recommendations:  Restart fluconazole induction 800mg  qday x14d then 400mg  PO daily Will need pill boxes refill in 2 wks with pharmacy  Kenneth Rivas, PharmD, BCPS, AAHIVP, CPP Clinical Infectious Disease Pharmacist [facility] 09/09/2017, 11:39 AM

## 2017-09-09 NOTE — Patient Instructions (Signed)
I am restarting you on fluconazole 200mg    You need to take FOUR of these tablets = 800mg   every day and then come back to see PHarmacy in 2 weeks, and then switch to 2 tablets = 400mg  daily  ONce we have treated you with this antifungal for 5 weeks we can THEN start you back on meds to treat your HIV  You also need to take Bactrim  Every day to prevent pneumonia    I will get some labs today as well  I want to see you again in 5 weeks

## 2017-09-09 NOTE — Progress Notes (Signed)
Subjective:   Chief Complaint: Blurry vision when does not wear his eye patch   Patient ID: Kenneth Rivas, male    DOB: [birth year], 56 y.o.   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  Underwent multiple lumbar punctures and was treated with 26 days of amphotericin with an overlap of 8 days of fluconazole.  Initially he was on flucytosine but developed toxicity related to this drug.  Unfortunately he was discharged from the hospital on 28 August WITHOUT EVER HAVING ADAP applied for and secured and so when he was DC he had ZERO payor source for his antifungal therapy.  Because of this his coccal meningitis has essentially been untreated for nearly 2 months.  Generally he does not have much in the way of symptoms right now and does not have headaches he does continue to have outward deviation of his left eye and blurry vision if he does not cover this with a patch.  Is brought in by [facility] our  Paramedic and he has filled out ADAP paperwork and is now active.  I will send a stat cryptococcal antigen today if it is positive I will bring him back in for repeat lumbar puncture and reinduction with amphotericin.  In the interim I am starting him on high-dose fluconazole 800 mg/day.  Hopefully a streptococcal antigen is negative and serum and he will do well on high-dose fluconazole.  In 2 weeks we then would plan on transitioning him over to 40 mg of fluconazole and then finally starting his antiretroviral therapy 5 weeks from now.  Lab Results  Component Value Date   HIV1RNAQUANT 8,010 06/16/2017   HIV1RNAQUANT 14,034 (H) 01/15/2015   HIV1RNAQUANT 11,896 (H) 11/01/2014   Lab Results  Component Value Date   CD4TABS 10 (L) 06/16/2017   CD4TABS <10 (L) 06/11/2017   CD4TABS <10 (L) 01/15/2015    Past Medical History:  Diagnosis Date  . AIDS (HCC)   . Anemia   . Anxiety   . Cellulitis of right hand 06/23/2017  . Hepatitis B   . HIV disease (HCC)   . Hypertension   . Immune deficiency disorder [facility])     Past Surgical History:  Procedure Laterality Date  . HERNIA REPAIR      Family History  Problem Relation Age of Onset  . Hypertension Mother       Social History   Social History  . Marital status: Married    Spouse name: N/A  . Number of children: N/A  . Years of education: N/A   Social History Main Topics  . Smoking status: Current Some Day Smoker    Packs/day: 0.30    Types: Cigarettes  . Smokeless tobacco: Never Used     Comment: pt. given quit line information  . Alcohol use 2.0 oz/week    4 Standard drinks or equivalent per week     Comment: beer  . Drug use: Yes    Types: Cocaine     Comment: crack- last use 03/2012. no h/o IVDU, other illicits  . Sexual activity: Yes    Partners: Male     Comment: patient declined condoms   Other Topics Concern  . Not on file   Social History Narrative  . No narrative on file    No Known Allergies   Current Outpatient Prescriptions:  .  amLODipine (NORVASC) 10 MG tablet, Take 1 tablet (10 mg total) by mouth daily. (Patient not taking: Reported on 09/09/2017), Disp: 30 tablet, Rfl: 0 .  feeding supplement, ENSURE ENLIVE, (ENSURE ENLIVE) LIQD, Take 237 mLs by mouth 2 (two) times daily between meals. (Patient not taking: Reported on 09/09/2017), Disp: 237 mL, Rfl: 12 .  fluconazole (DIFLUCAN) 200 MG tablet, Take 4 tablets (800 mg total) by mouth daily. 4 tablets qday x 14 days, then 2 tablets daily, Disp: 90 tablet, Rfl: 5 .  polyvinyl alcohol (LIQUIFILM TEARS) 1.4 % ophthalmic solution, Place 1 drop into both eyes at bedtime. (Patient not taking: Reported on 09/09/2017), Disp: 15 mL, Rfl: 0 .  sulfamethoxazole-trimethoprim (BACTRIM DS,SEPTRA DS) 800-160 MG tablet, Take 1 tablet by mouth daily., Disp: 30  tablet, Rfl: 11   Review of Systems  Constitutional: Positive for fatigue. Negative for chills and fever.  HENT: Negative for congestion and sore throat.   Eyes: Positive for visual disturbance. Negative for photophobia, pain, discharge and redness.  Respiratory: Negative for cough, shortness of breath and wheezing.   Cardiovascular: Negative for chest pain, palpitations and leg swelling.  Gastrointestinal: Negative for abdominal pain, blood in stool, constipation, diarrhea, nausea and vomiting.  Genitourinary: Negative for dysuria, flank pain and hematuria.  Musculoskeletal: Negative for back pain, myalgias and neck stiffness.  Skin: Negative for rash.  Neurological: Negative for dizziness, weakness, light-headedness and headaches.  Hematological: Does not bruise/bleed easily.  Psychiatric/Behavioral: Negative for suicidal ideas.       Objective:   Physical Exam  Constitutional: He is oriented to person, place, and time. He appears cachectic. No distress.  HENT:  Head: Normocephalic and atraumatic.  Mouth/Throat: No oropharyngeal exudate.  Eyes: Conjunctivae and EOM are normal. No scleral icterus.  Neck: Normal range of motion. Neck supple.  Cardiovascular: Normal rate and regular rhythm.   Pulmonary/Chest: Effort normal. No respiratory distress. He has no wheezes.  Abdominal: He exhibits no distension.  Musculoskeletal: He exhibits no edema or tenderness.  Neurological: He is alert and oriented to person, place, and time. A cranial nerve deficit is present. No sensory deficit. He exhibits normal muscle tone. Coordination normal.  Left eye deviates outward  Skin: Skin is warm and dry. No rash noted. He is not diaphoretic. No erythema. No pallor.  Dry skin  Psychiatric: He has a normal mood and affect. His behavior is normal. Judgment and thought content normal.          Assessment & Plan:   Cryptococcal meningitis: Dr. Matthew Saras was discharged without apparent source for his  fluconazole in late August as an absolute disaster considering he spent more than a month in the hospital much of the time in critical care.  He is at high risk for needing to be readmitted.  We will send off a stat Coccal antigen and serum.  Hopefully it is negative if positive we will have to readmit him repeat lumbar punctures and reinduce him with amphotericin in the interim I am going to start him on high-dose oral fluconazole as above with 2 weeks of high-dose 800 mg daily fluconazole followed by 400 mg of fluconazole.  Will defer anti-retroviral therapy until 5 weeks from today's initiation of antifungal therapy.  We will continue Bactrim for PCP prevention.  Cachexia and nourishment: We will try to help him get supplemental nutrition for this.  HIV/AIDS: will  Need to defer at least 5 weeks.  Cranial nerve palsy: Continue eye patch  I spent greater than 25  minutes with the patient including greater than 50% of time in face to face counsel of Lambert Ketolonzo and in coordination ofhis care with ID pharmacy, [facility] counselor, and on how critical it would be for him to take his antifungal therapy and how he was at high risk of needing to be readmitted to the hospital yet again.  I explained why we could not start his intervertebral medications at this time due to risk of immune reconstitution syndrome.

## 2017-09-10 ENCOUNTER — Ambulatory Visit (INDEPENDENT_AMBULATORY_CARE_PROVIDER_SITE_OTHER): Payer: Self-pay | Admitting: Infectious Diseases

## 2017-09-10 DIAGNOSIS — B20 Human immunodeficiency virus [HIV] disease: Secondary | ICD-10-CM

## 2017-09-10 DIAGNOSIS — B459 Cryptococcosis, unspecified: Secondary | ICD-10-CM

## 2017-09-10 LAB — COMPLETE METABOLIC PANEL WITH GFR
AG RATIO: 0.8 (calc) — AB (ref 1.0–2.5)
ALBUMIN MSPROF: 3.6 g/dL (ref 3.6–5.1)
ALKALINE PHOSPHATASE (APISO): 268 U/L — AB (ref 40–115)
ALT: 14 U/L (ref 9–46)
AST: 22 U/L (ref 10–35)
BILIRUBIN TOTAL: 0.3 mg/dL (ref 0.2–1.2)
BUN: 10 mg/dL (ref 7–25)
CHLORIDE: 108 mmol/L (ref 98–110)
CO2: 25 mmol/L (ref 20–32)
Calcium: 8.8 mg/dL (ref 8.6–10.3)
Creat: 0.85 mg/dL (ref 0.70–1.33)
GFR, EST AFRICAN AMERICAN: 111 mL/min/{1.73_m2} (ref >=60)
GFR, Est Non African American: 96 mL/min/{1.73_m2} (ref >=60)
GLOBULIN: 4.8 g/dL — AB (ref 1.9–3.7)
GLUCOSE: 92 mg/dL (ref 65–99)
Potassium: 3.9 mmol/L (ref 3.5–5.3)
SODIUM: 140 mmol/L (ref 135–146)
TOTAL PROTEIN: 8.4 g/dL — AB (ref 6.1–8.1)

## 2017-09-10 LAB — CBC WITH DIFFERENTIAL/PLATELET
BASOS ABS: 21 {cells}/uL (ref 0–200)
BASOS PCT: 0.9 %
EOS PCT: 28.7 %
Eosinophils Absolute: 660 cells/uL — ABNORMAL HIGH (ref 15–500)
HCT: 30.6 % — ABNORMAL LOW (ref 38.5–50.0)
HEMOGLOBIN: 10.2 g/dL — AB (ref 13.2–17.1)
LYMPHS ABS: 469 {cells}/uL — AB (ref 850–3900)
MCH: 28.7 pg (ref 27.0–33.0)
MCHC: 33.3 g/dL (ref 32.0–36.0)
MCV: 86 fL (ref 80.0–100.0)
MPV: 10.6 fL (ref 7.5–12.5)
Monocytes Relative: 15.2 %
NEUTROS PCT: 34.8 %
Neutro Abs: 800 cells/uL — ABNORMAL LOW (ref 1500–7800)
Platelets: 202 10*3/uL (ref 140–400)
RBC: 3.56 10*6/uL — ABNORMAL LOW (ref 4.20–5.80)
RDW: 15 % (ref 11.0–15.0)
TOTAL LYMPHOCYTE: 20.4 %
WBC mixed population: 350 cells/uL (ref 200–950)
WBC: 2.3 10*3/uL — AB (ref 3.8–10.8)

## 2017-09-10 LAB — T-HELPER CELL (CD4) - (RCID CLINIC ONLY)

## 2017-09-10 LAB — RPR: RPR Ser Ql: NONREACTIVE

## 2017-09-10 NOTE — Progress Notes (Signed)
[facility] Patient declined [facility].  Andree CossHowell, Anddy Wingert M, RN

## 2017-09-11 NOTE — Assessment & Plan Note (Signed)
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 I reinforced exactly Dr. Clinton GallantVan Dam's plan to admit with LP and for re-induction therapy with Fluconazole + Ampho however he continued to decline. At first cited that he wanted to d/w Dr. Daiva EvesVan Dam then later told Marthann SchillerMitch he really has something personal to take care of Friday he cannot miss. I explained that although he has no symptoms now I could not guarantee he would continue this way and he is at risk for brain damage, stroke, and other life threatening situations should this get out of control and not handled ideally. He again declined admission today. I advised that he should continue taking the Fluconazole 800 mg until he hears from Dr. Daiva EvesVan Dam next week and report to ED should he have any changes in neurologic status including headaches/vision changes.

## 2017-09-11 NOTE — Assessment & Plan Note (Signed)
Continues off medications for now as he was just treated for CM and now with likely recurrence. Continue to hold.

## 2017-09-11 NOTE — Progress Notes (Signed)
Kenneth Rivas  [id number]  Oct 13, 1961  Kenneth Rivas, Kenneth Grinder, MD   Brief Narrative: Kenneth Rivas is 56 y.o. AA male patient of Dr. Clinton Rivas with HIV infection that required prolonged hospitalization for severe cryptococcal meningoencephalitis requiring intubation and prolonged ventilatory failure. Serial lumbar punctures during admission and tx with extended course (26 days) with amphotericin; intolerant to flucytosine d/t toxicity and did 8 days of dual therapy with high dose fluconazole before transitioning to high dose fluconazole therapy when he was discharged on August 28th. He did not at the time have his ADAP application completed and he had no payor source for his antifungal therapy and has been untreated for 2 months now. He has not yet been started back on ART d/t concern over r/f IRIS and worsening of cryptococcal disease.   In to see Dr. Daiva Rivas in follow up yesterday and cryptococcal ag was drawn. He was started back on 800 mg Fluconazole daily. Today he is back d/t + serum antigen with extremely high titer > 2560. He was brought to clinic today by his bridge counselor Kenneth Rivas. He denies headaches, vision changes, neck pain, change in LOC/cognition and is outwardly refusing to be admitted to the hospital today. Wants to follow back up on Monday to be admitted then.    Patient Active Problem List   Diagnosis Date Noted  . Encounter for imaging study to confirm nasogastric (NG) tube placement   . Meningitis   . Blurry vision, left eye   . Pressure injury of skin 06/26/2017  . Ptosis, left eyelid   . Acute respiratory failure with hypoxia (HCC)   . Cellulitis of right hand 06/23/2017  . 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 Lung abscess (HCC)   . Refeeding syndrome 06/18/2017  . Hypophosphatemia 06/18/2017  . Cryptococcosis (HCC)   . Protein-calorie malnutrition, severe 06/17/2017  . Abnormal chest x-ray   . AIDS (acquired immune deficiency syndrome) (HCC)   .  Cavitary pneumonia   . Weakness 06/16/2017  . Cachexia associated with AIDS (HCC)   . Hypokalemia   . Hyponatremia   . 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 HTN (hypertension) 08/17/2013  . Hepatitis B   . ELEVATED BLOOD PRESSURE 06/12/2010  . TB SKIN TEST, POSITIVE 01/12/2009    Patient's Medications  New Prescriptions   No medications on file  Previous Medications   AMLODIPINE (NORVASC) 10 MG TABLET    Take 1 tablet (10 mg total) by mouth daily.   FEEDING SUPPLEMENT, ENSURE ENLIVE, (ENSURE ENLIVE) LIQD    Take 237 mLs by mouth 2 (two) times daily between meals.   FLUCONAZOLE (DIFLUCAN) 200 MG TABLET    Take 4 tablets (800 mg total) by mouth daily. 4 tablets qday x 14 days, then 2 tablets daily   POLYVINYL ALCOHOL (LIQUIFILM TEARS) 1.4 % OPHTHALMIC SOLUTION    Place 1 drop into both eyes at bedtime.   SULFAMETHOXAZOLE-TRIMETHOPRIM (BACTRIM DS,SEPTRA DS) 800-160 MG TABLET    Take 1 tablet by mouth daily.  Modified Medications   No medications on file  Discontinued Medications   No medications on file   Review of Systems: Review of Systems  All other systems reviewed and are negative.   Past Medical History:  Diagnosis Date  . AIDS (HCC)   . Anemia   . Anxiety   . Cellulitis of right hand 06/23/2017  . Hepatitis B   . HIV disease (HCC)   . Hypertension   . Immune deficiency disorder (HCC)  Social History  Substance Use Topics  . Smoking status: Current Some Day Smoker    Packs/day: 0.30    Types: Cigarettes  . Smokeless tobacco: Never Used     Comment: pt. given quit line information  . Alcohol use 2.0 oz/week    4 Standard drinks or equivalent per week     Comment: beer    Family History  Problem Relation Age of Onset  . Hypertension Mother     No Known Allergies  Objective:  There were no vitals filed for this visit. There is no height or weight on file to calculate BMI.  Physical Exam  Constitutional: He is oriented to person, place, and time.    Thin appearing, unkempt AA male.   Eyes: Pupils are equal, round, and reactive to light. Right eye exhibits no discharge. Left eye exhibits no discharge. No scleral icterus. Right eye exhibits normal extraocular motion and no nystagmus. Left eye exhibits abnormal extraocular motion. Left eye exhibits no nystagmus.  Left eye with patch - outward deviated gaze noted.   Cardiovascular: Normal rate, regular rhythm and normal heart sounds.   Pulmonary/Chest: Effort normal and breath sounds normal.      Neurological: He is alert and oriented to person, place, and time. He has normal strength. He displays facial symmetry. Coordination normal.  Skin: Skin is warm and dry.  Psychiatric: Mood, affect and judgment normal.    Lab Results Lab Results  Component Value Date   WBC 2.3 (L) 09/09/2017   HGB 10.2 (L) 09/09/2017   HCT 30.6 (L) 09/09/2017   MCV 86.0 09/09/2017   PLT 202 09/09/2017    Lab Results  Component Value Date   CREATININE 0.85 09/09/2017   BUN 10 09/09/2017   NA 140 09/09/2017   K 3.9 09/09/2017   CL 108 09/09/2017   CO2 25 09/09/2017    Lab Results  Component Value Date   ALT 14 09/09/2017   AST 22 09/09/2017   ALKPHOS 77 06/27/2017   BILITOT 0.3 09/09/2017    Lab Results  Component Value Date   CHOL 152 09/16/2010   HDL 48 09/16/2010   LDLCALC 93 09/16/2010   TRIG 57 09/16/2010   CHOLHDL 3.2 Ratio 09/16/2010   HIV 1 RNA Quant (copies/mL)  Date Value  06/16/2017 8,010  01/15/2015 14,034 (H)  11/01/2014 11,896 (H)   CD4 T Cell Abs (/uL)  Date Value  09/09/2017 <10 (L)  06/16/2017 10 (L)  06/11/2017 <10 (L)   No results found for: HAV Lab Results  Component Value Date   HEPBSAG NO 01/11/2007   HEPBSAB NO 01/11/2007   Lab Results  Component Value Date   HCVAB NO 01/11/2007   No results found for: CHLAMYDIAWP, N No results found for: GCPROBEAPT No results found for: QUANTGOLD No results found for: RPR    Problem List Items Addressed This  Visit      Other   AIDS (acquired immune deficiency syndrome) (HCC) - Primary    Continues off medications for now as he was just treated for CM and now with likely recurrence. Continue to hold.       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 I reinforced exactly Dr. Clinton Rivas plan to admit with LP and for re-induction therapy with Fluconazole + Ampho however he continued to decline. At first cited that he wanted to d/w Dr. Daiva Rivas then later told Marthann Schiller he really has something personal  to take care of Friday he cannot miss. I explained that although he has no symptoms now I could not guarantee he would continue this way and he is at risk for brain damage, stroke, and other life threatening situations should this get out of control and not handled ideally. He again declined admission today. I advised that he should continue taking the Fluconazole 800 mg until he hears from Dr. Daiva EvesVan Dam next week and report to ED should he have any changes in neurologic status including headaches/vision changes.         Rexene AlbertsStephanie Dixon, MSN, NP-C [facility] Pager: [phone]  09/10/2017 11:45 AM

## 2017-09-21 LAB — HIV-1 GENOTYPE: HIV-1 GENOTYPE: DETECTED — AB

## 2017-09-21 LAB — HIV RNA, RTPCR W/R GT (RTI, PI,INT)
HIV 1 RNA Quant: 155000 copies/mL — ABNORMAL HIGH
HIV-1 RNA QUANT, LOG: 5.19 {Log_copies}/mL — AB

## 2017-09-21 LAB — HIV-1 INTEGRASE GENOTYPE

## 2017-09-23 ENCOUNTER — Ambulatory Visit: Payer: Self-pay

## 2017-10-14 ENCOUNTER — Ambulatory Visit: Payer: Self-pay | Admitting: Infectious Disease

## 2017-10-14 ENCOUNTER — Telehealth: Payer: Self-pay | Admitting: *Deleted

## 2017-10-14 NOTE — Telephone Encounter (Signed)
Forms signed by DR Daiva EvesVan Dam, mailed to [employer] in supplied envelope. Copy made for chart. Andree CossHowell, Mailee Klaas M, RN

## 2017-11-02 ENCOUNTER — Ambulatory Visit: Payer: Self-pay

## 2017-11-26 ENCOUNTER — Emergency Department (HOSPITAL_COMMUNITY): Payer: Medicaid Other

## 2017-11-26 ENCOUNTER — Inpatient Hospital Stay (HOSPITAL_COMMUNITY)
Admission: EM | Admit: 2017-11-26 | Discharge: 2017-12-24 | DRG: 974 | Disposition: A | Discharge status: Home Health | Payer: Medicaid Other | Attending: Internal Medicine | Admitting: Internal Medicine

## 2017-11-26 ENCOUNTER — Encounter (HOSPITAL_COMMUNITY): Payer: Self-pay | Admitting: *Deleted

## 2017-11-26 DIAGNOSIS — D61818 Other pancytopenia: Secondary | ICD-10-CM | POA: Diagnosis present

## 2017-11-26 DIAGNOSIS — E43 Unspecified severe protein-calorie malnutrition: Secondary | ICD-10-CM | POA: Diagnosis present

## 2017-11-26 DIAGNOSIS — Z681 Body mass index (BMI) 19 or less, adult: Secondary | ICD-10-CM

## 2017-11-26 DIAGNOSIS — Z741 Need for assistance with personal care: Secondary | ICD-10-CM | POA: Diagnosis present

## 2017-11-26 DIAGNOSIS — Z9119 Patient's noncompliance with other medical treatment and regimen: Secondary | ICD-10-CM

## 2017-11-26 DIAGNOSIS — R834 Abnormal immunological findings in cerebrospinal fluid: Secondary | ICD-10-CM

## 2017-11-26 DIAGNOSIS — D649 Anemia, unspecified: Secondary | ICD-10-CM | POA: Clinically undetermined

## 2017-11-26 DIAGNOSIS — E875 Hyperkalemia: Secondary | ICD-10-CM | POA: Diagnosis not present

## 2017-11-26 DIAGNOSIS — Z008 Encounter for other general examination: Secondary | ICD-10-CM

## 2017-11-26 DIAGNOSIS — Z8661 Personal history of infections of the central nervous system: Secondary | ICD-10-CM

## 2017-11-26 DIAGNOSIS — R888 Abnormal findings in other body fluids and substances: Secondary | ICD-10-CM

## 2017-11-26 DIAGNOSIS — B379 Candidiasis, unspecified: Secondary | ICD-10-CM | POA: Diagnosis present

## 2017-11-26 DIAGNOSIS — E871 Hypo-osmolality and hyponatremia: Secondary | ICD-10-CM | POA: Diagnosis present

## 2017-11-26 DIAGNOSIS — R931 Abnormal findings on diagnostic imaging of heart and coronary circulation: Secondary | ICD-10-CM | POA: Diagnosis present

## 2017-11-26 DIAGNOSIS — I951 Orthostatic hypotension: Secondary | ICD-10-CM | POA: Diagnosis present

## 2017-11-26 DIAGNOSIS — I5020 Unspecified systolic (congestive) heart failure: Secondary | ICD-10-CM | POA: Diagnosis present

## 2017-11-26 DIAGNOSIS — R9431 Abnormal electrocardiogram [ECG] [EKG]: Secondary | ICD-10-CM | POA: Diagnosis present

## 2017-11-26 DIAGNOSIS — I472 Ventricular tachycardia: Secondary | ICD-10-CM | POA: Diagnosis not present

## 2017-11-26 DIAGNOSIS — R64 Cachexia: Secondary | ICD-10-CM

## 2017-11-26 DIAGNOSIS — E872 Acidosis: Secondary | ICD-10-CM | POA: Diagnosis not present

## 2017-11-26 DIAGNOSIS — D638 Anemia in other chronic diseases classified elsewhere: Secondary | ICD-10-CM | POA: Diagnosis present

## 2017-11-26 DIAGNOSIS — F0280 Dementia in other diseases classified elsewhere without behavioral disturbance: Secondary | ICD-10-CM | POA: Diagnosis present

## 2017-11-26 DIAGNOSIS — E860 Dehydration: Secondary | ICD-10-CM | POA: Diagnosis present

## 2017-11-26 DIAGNOSIS — B1910 Unspecified viral hepatitis B without hepatic coma: Secondary | ICD-10-CM | POA: Diagnosis present

## 2017-11-26 DIAGNOSIS — I10 Essential (primary) hypertension: Secondary | ICD-10-CM | POA: Diagnosis present

## 2017-11-26 DIAGNOSIS — I429 Cardiomyopathy, unspecified: Secondary | ICD-10-CM

## 2017-11-26 DIAGNOSIS — E222 Syndrome of inappropriate secretion of antidiuretic hormone: Secondary | ICD-10-CM | POA: Diagnosis not present

## 2017-11-26 DIAGNOSIS — R32 Unspecified urinary incontinence: Secondary | ICD-10-CM | POA: Diagnosis not present

## 2017-11-26 DIAGNOSIS — B20 Human immunodeficiency virus [HIV] disease: Secondary | ICD-10-CM | POA: Diagnosis not present

## 2017-11-26 DIAGNOSIS — B451 Cerebral cryptococcosis: Secondary | ICD-10-CM | POA: Diagnosis present

## 2017-11-26 DIAGNOSIS — F419 Anxiety disorder, unspecified: Secondary | ICD-10-CM | POA: Diagnosis present

## 2017-11-26 DIAGNOSIS — N179 Acute kidney failure, unspecified: Secondary | ICD-10-CM | POA: Diagnosis not present

## 2017-11-26 DIAGNOSIS — I43 Cardiomyopathy in diseases classified elsewhere: Secondary | ICD-10-CM | POA: Diagnosis present

## 2017-11-26 DIAGNOSIS — F17210 Nicotine dependence, cigarettes, uncomplicated: Secondary | ICD-10-CM | POA: Diagnosis present

## 2017-11-26 DIAGNOSIS — R55 Syncope and collapse: Secondary | ICD-10-CM | POA: Diagnosis present

## 2017-11-26 DIAGNOSIS — Z8249 Family history of ischemic heart disease and other diseases of the circulatory system: Secondary | ICD-10-CM

## 2017-11-26 DIAGNOSIS — T375X5A Adverse effect of antiviral drugs, initial encounter: Secondary | ICD-10-CM | POA: Diagnosis not present

## 2017-11-26 DIAGNOSIS — G0490 Encephalitis and encephalomyelitis, unspecified: Principal | ICD-10-CM | POA: Diagnosis present

## 2017-11-26 DIAGNOSIS — Z9114 Patient's other noncompliance with medication regimen: Secondary | ICD-10-CM

## 2017-11-26 DIAGNOSIS — Z7401 Bed confinement status: Secondary | ICD-10-CM

## 2017-11-26 DIAGNOSIS — H6123 Impacted cerumen, bilateral: Secondary | ICD-10-CM | POA: Diagnosis present

## 2017-11-26 DIAGNOSIS — E88A Wasting disease (syndrome) due to underlying condition: Secondary | ICD-10-CM | POA: Diagnosis present

## 2017-11-26 DIAGNOSIS — E876 Hypokalemia: Secondary | ICD-10-CM | POA: Diagnosis present

## 2017-11-26 DIAGNOSIS — I110 Hypertensive heart disease with heart failure: Secondary | ICD-10-CM | POA: Diagnosis present

## 2017-11-26 LAB — I-STAT TROPONIN, ED: TROPONIN I, POC: 0.02 ng/mL (ref 0.00–0.08)

## 2017-11-26 LAB — CBC WITH DIFFERENTIAL/PLATELET
Basophils Absolute: 0 10*3/uL (ref 0.0–0.1)
Basophils Relative: 0 %
EOS PCT: 1 %
Eosinophils Absolute: 0 10*3/uL (ref 0.0–0.7)
HEMATOCRIT: 40 % (ref 39.0–52.0)
Hemoglobin: 13.6 g/dL (ref 13.0–17.0)
LYMPHS ABS: 0.3 10*3/uL — AB (ref 0.7–4.0)
LYMPHS PCT: 10 %
MCH: 28 pg (ref 26.0–34.0)
MCHC: 34 g/dL (ref 30.0–36.0)
MCV: 82.3 fL (ref 78.0–100.0)
MONO ABS: 0.3 10*3/uL (ref 0.1–1.0)
MONOS PCT: 12 %
NEUTROS ABS: 2 10*3/uL (ref 1.7–7.7)
Neutrophils Relative %: 77 %
Platelets: 100 10*3/uL — ABNORMAL LOW (ref 150–400)
RBC: 4.86 MIL/uL (ref 4.22–5.81)
RDW: 13.4 % (ref 11.5–15.5)
WBC: 2.6 10*3/uL — ABNORMAL LOW (ref 4.0–10.5)

## 2017-11-26 LAB — URINALYSIS, ROUTINE W REFLEX MICROSCOPIC
Bacteria, UA: NONE SEEN
Bilirubin Urine: NEGATIVE
GLUCOSE, UA: 50 mg/dL — AB
Ketones, ur: 20 mg/dL — AB
LEUKOCYTES UA: NEGATIVE
NITRITE: NEGATIVE
PH: 6 (ref 5.0–8.0)
Protein, ur: 100 mg/dL — AB
SPECIFIC GRAVITY, URINE: 1.017 (ref 1.005–1.030)

## 2017-11-26 LAB — I-STAT CHEM 8, ED
BUN: 11 mg/dL (ref 6–20)
Calcium, Ion: 1.09 mmol/L — ABNORMAL LOW (ref 1.15–1.40)
Chloride: 88 mmol/L — ABNORMAL LOW (ref 101–111)
Creatinine, Ser: 0.7 mg/dL (ref 0.61–1.24)
GLUCOSE: 110 mg/dL — AB (ref 65–99)
HCT: 44 % (ref 39.0–52.0)
HEMOGLOBIN: 15 g/dL (ref 13.0–17.0)
Potassium: 3.2 mmol/L — ABNORMAL LOW (ref 3.5–5.1)
Sodium: 134 mmol/L — ABNORMAL LOW (ref 135–145)
TCO2: 31 mmol/L (ref 22–32)

## 2017-11-26 LAB — RAPID URINE DRUG SCREEN, HOSP PERFORMED
Amphetamines: NOT DETECTED
BARBITURATES: NOT DETECTED
Benzodiazepines: NOT DETECTED
COCAINE: POSITIVE — AB
Opiates: NOT DETECTED
TETRAHYDROCANNABINOL: NOT DETECTED

## 2017-11-26 LAB — COMPREHENSIVE METABOLIC PANEL
ALBUMIN: 3.4 g/dL — AB (ref 3.5–5.0)
ALK PHOS: 133 U/L — AB (ref 38–126)
ALT: 78 U/L — ABNORMAL HIGH (ref 17–63)
ANION GAP: 13 (ref 5–15)
AST: 38 U/L (ref 15–41)
BUN: 12 mg/dL (ref 6–20)
CALCIUM: 8.8 mg/dL — AB (ref 8.9–10.3)
CO2: 28 mmol/L (ref 22–32)
Chloride: 89 mmol/L — ABNORMAL LOW (ref 101–111)
Creatinine, Ser: 0.79 mg/dL (ref 0.61–1.24)
GFR calc Af Amer: >60 mL/min (ref >60)
GLUCOSE: 109 mg/dL — AB (ref 65–99)
POTASSIUM: 3.1 mmol/L — AB (ref 3.5–5.1)
Sodium: 130 mmol/L — ABNORMAL LOW (ref 135–145)
Total Bilirubin: 1 mg/dL (ref 0.3–1.2)
Total Protein: 8.6 g/dL — ABNORMAL HIGH (ref 6.5–8.1)

## 2017-11-26 LAB — ETHANOL: Alcohol, Ethyl (B): <10 mg/dL (ref <10)

## 2017-11-26 LAB — PROTIME-INR
INR: 0.95
Prothrombin Time: 12.6 seconds (ref 11.4–15.2)

## 2017-11-26 LAB — PHOSPHORUS: PHOSPHORUS: 3.9 mg/dL (ref 2.5–4.6)

## 2017-11-26 LAB — APTT: aPTT: 32 seconds (ref 24–36)

## 2017-11-26 LAB — MAGNESIUM: Magnesium: 1.7 mg/dL (ref 1.7–2.4)

## 2017-11-26 MED ORDER — HYDRALAZINE HCL 20 MG/ML IJ SOLN
10.0000 mg | Freq: Three times a day (TID) | INTRAMUSCULAR | Status: DC | PRN
  Administered 2017-11-27 – 2017-12-09 (×3): 10 mg via INTRAVENOUS
  Filled 2017-11-26 (×3): qty 1

## 2017-11-26 MED ORDER — DOCUSATE SODIUM 50 MG/5ML PO LIQD: 50.0000 mg | Freq: Two times a day (BID) | ORAL | Status: DC

## 2017-11-26 MED ORDER — ACETAMINOPHEN 650 MG RE SUPP: 650.0000 mg | Freq: Four times a day (QID) | RECTAL | Status: DC | PRN

## 2017-11-26 MED ORDER — DOCUSATE SODIUM 50 MG/5ML PO LIQD
25.0000 mg | Freq: Two times a day (BID) | ORAL | Status: DC
  Administered 2017-11-27 – 2017-11-30 (×7): 25 mg via OTIC
  Administered 2017-12-01: 5 mL via OTIC
  Administered 2017-12-01 – 2017-12-24 (×44): 25 mg via OTIC
  Filled 2017-11-26 (×58): qty 10

## 2017-11-26 MED ORDER — MAGNESIUM SULFATE 2 GM/50ML IV SOLN
2.0000 g | Freq: Once | INTRAVENOUS | Status: AC
  Administered 2017-11-26: 2 g via INTRAVENOUS
  Filled 2017-11-26: qty 50

## 2017-11-26 MED ORDER — ACETAMINOPHEN 325 MG PO TABS
650.0000 mg | ORAL_TABLET | Freq: Four times a day (QID) | ORAL | Status: DC | PRN
Start: 2017-11-26 — End: 2017-12-24
  Administered 2017-11-30 – 2017-12-10 (×2): 650 mg via ORAL
  Filled 2017-11-26 (×2): qty 2

## 2017-11-26 MED ORDER — POTASSIUM CHLORIDE CRYS ER 20 MEQ PO TBCR
40.0000 meq | EXTENDED_RELEASE_TABLET | Freq: Once | ORAL | Status: AC
  Administered 2017-11-26: 40 meq via ORAL
  Filled 2017-11-26 (×2): qty 2

## 2017-11-26 MED ORDER — POTASSIUM CHLORIDE 10 MEQ/100ML IV SOLN
10.0000 meq | Freq: Once | INTRAVENOUS | Status: AC
  Administered 2017-11-26: 10 meq via INTRAVENOUS
  Filled 2017-11-26: qty 100

## 2017-11-26 MED ORDER — SODIUM CHLORIDE 0.9 % IV BOLUS (SEPSIS)
1000.0000 mL | Freq: Once | INTRAVENOUS | Status: AC
  Administered 2017-11-26: 1000 mL via INTRAVENOUS

## 2017-11-26 MED ORDER — SODIUM CHLORIDE 0.9 % IV SOLN
INTRAVENOUS | Status: AC
  Administered 2017-11-26 – 2017-11-27 (×2): via INTRAVENOUS

## 2017-11-26 MED ORDER — SODIUM CHLORIDE 0.9% FLUSH
3.0000 mL | Freq: Two times a day (BID) | INTRAVENOUS | Status: DC
  Administered 2017-11-27 – 2017-12-23 (×24): 3 mL via INTRAVENOUS

## 2017-11-26 NOTE — ED Notes (Signed)
ED TO INPATIENT HANDOFF REPORT  Name/Age/Gender Kenneth Rivas 57 y.o. male  Code Status Code Status History    Date Active Date Inactive Code Status Order ID Comments User Context   06/16/2017 22:56 07/15/2017 02:15 Full Code [id number]  Lovenia Kim, MD Inpatient      Home/SNF/Other Home  Chief Complaint weakness syncope  Level of Care/Admitting Diagnosis ED Disposition    ED Disposition Condition [facility] Area: [facility] [[id number]]  Level of Care: Telemetry [5]  Admit to tele based on following criteria: Eval of Syncope  Diagnosis: Prolonged QT interval [517616]  Admitting Physician: Elwin Mocha [[id number]]  Attending Physician: Aggie Moats, Layne Benton [[id number]]  PT Class (Do Not Modify): Observation [104]  PT Acc Code (Do Not Modify): Observation [10022]       Medical History Past Medical History:  Diagnosis Date  . AIDS ([place])   . Anemia   . Anxiety   . Cellulitis of right hand 06/23/2017  . Hepatitis B   . HIV disease ([place])   . Hypertension   . Immune deficiency disorder ([place])     Allergies No Known Allergies  IV Location/Drains/Wounds Patient Lines/Drains/Airways Status   Active Line/Drains/Airways    Name:   Placement date:   Placement time:   Site:   Days:   Peripheral IV 11/26/17 Left;Upper Arm   11/26/17    1743    Arm   less than 1   External Urinary Catheter   07/10/17    1100    -   139   Pressure Injury 07/04/17 Stage II -  Partial thickness loss of dermis presenting as a shallow open ulcer with a red, pink wound bed without slough.   07/04/17    1305     145          Labs/Imaging Results for orders placed or performed during the hospital encounter of 11/26/17 (from the past 48 hour(s))  Ethanol     Status: None   Collection Time: 11/26/17  4:30 PM  Result Value Ref Range   Alcohol, Ethyl (B) <10 <10 mg/dL    Comment:        LOWEST DETECTABLE LIMIT FOR SERUM ALCOHOL IS 10 mg/dL FOR MEDICAL PURPOSES ONLY    Comprehensive metabolic panel     Status: Abnormal   Collection Time: 11/26/17  4:30 PM  Result Value Ref Range   Sodium 130 (L) 135 - 145 mmol/L   Potassium 3.1 (L) 3.5 - 5.1 mmol/L   Chloride 89 (L) 101 - 111 mmol/L   CO2 28 22 - 32 mmol/L   Glucose, Bld 109 (H) 65 - 99 mg/dL   BUN 12 6 - 20 mg/dL   Creatinine, Ser 0.79 0.61 - 1.24 mg/dL   Calcium 8.8 (L) 8.9 - 10.3 mg/dL   Total Protein 8.6 (H) 6.5 - 8.1 g/dL   Albumin 3.4 (L) 3.5 - 5.0 g/dL   AST 38 15 - 41 U/L   ALT 78 (H) 17 - 63 U/L   Alkaline Phosphatase 133 (H) 38 - 126 U/L   Total Bilirubin 1.0 0.3 - 1.2 mg/dL   GFR calc non Af Amer >60 >60 mL/min   GFR calc Af Amer >60 >60 mL/min    Comment: (NOTE) The eGFR has been calculated using the CKD EPI equation. This calculation has not been validated in all clinical situations. eGFR's persistently <60 mL/min signify possible Chronic Kidney Disease.    Anion gap 13  5 - 15  CBC WITH DIFFERENTIAL     Status: Abnormal   Collection Time: 11/26/17  4:30 PM  Result Value Ref Range   WBC 2.6 (L) 4.0 - 10.5 K/uL   RBC 4.86 4.22 - 5.81 MIL/uL   Hemoglobin 13.6 13.0 - 17.0 g/dL   HCT 40.0 39.0 - 52.0 %   MCV 82.3 78.0 - 100.0 fL   MCH 28.0 26.0 - 34.0 pg   MCHC 34.0 30.0 - 36.0 g/dL   RDW 13.4 11.5 - 15.5 %   Platelets 100 (L) 150 - 400 K/uL    Comment: REPEATED TO VERIFY SPECIMEN CHECKED FOR CLOTS PLATELET COUNT CONFIRMED BY SMEAR    Neutrophils Relative % 77 %   Neutro Abs 2.0 1.7 - 7.7 K/uL   Lymphocytes Relative 10 %   Lymphs Abs 0.3 (L) 0.7 - 4.0 K/uL   Monocytes Relative 12 %   Monocytes Absolute 0.3 0.1 - 1.0 K/uL   Eosinophils Relative 1 %   Eosinophils Absolute 0.0 0.0 - 0.7 K/uL   Basophils Relative 0 %   Basophils Absolute 0.0 0.0 - 0.1 K/uL  Magnesium     Status: None   Collection Time: 11/26/17  4:30 PM  Result Value Ref Range   Magnesium 1.7 1.7 - 2.4 mg/dL  I-stat troponin, ED     Status: None   Collection Time: 11/26/17  4:51 PM  Result Value Ref  Range   Troponin i, poc 0.02 0.00 - 0.08 ng/mL   Comment 3            Comment: Due to the release kinetics of cTnI, a negative result within the first hours of the onset of symptoms does not rule out myocardial infarction with certainty. If myocardial infarction is still suspected, repeat the test at appropriate intervals.   I-Stat Chem 8, ED     Status: Abnormal   Collection Time: 11/26/17  4:53 PM  Result Value Ref Range   Sodium 134 (L) 135 - 145 mmol/L   Potassium 3.2 (L) 3.5 - 5.1 mmol/L   Chloride 88 (L) 101 - 111 mmol/L   BUN 11 6 - 20 mg/dL   Creatinine, Ser 0.70 0.61 - 1.24 mg/dL   Glucose, Bld 110 (H) 65 - 99 mg/dL   Calcium, Ion 1.09 (L) 1.15 - 1.40 mmol/L   TCO2 31 22 - 32 mmol/L   Hemoglobin 15.0 13.0 - 17.0 g/dL   HCT 44.0 39.0 - 52.0 %  Protime-INR     Status: None   Collection Time: 11/26/17  5:00 PM  Result Value Ref Range   Prothrombin Time 12.6 11.4 - 15.2 seconds   INR 0.95   APTT     Status: None   Collection Time: 11/26/17  5:00 PM  Result Value Ref Range   aPTT 32 24 - 36 seconds   Dg Chest 2 View  Result Date: 11/26/2017 CLINICAL DATA:  Weakness EXAM: CHEST  2 VIEW COMPARISON:  07/11/2017 chest radiograph. FINDINGS: Stable cardiomediastinal silhouette with normal heart size. No pneumothorax. No pleural effusion. Lungs appear clear, with no acute consolidative airspace disease and no pulmonary edema. IMPRESSION: No active cardiopulmonary disease. Electronically Signed   By: Ilona Sorrel M.D.   On: 11/26/2017 17:27   Ct Head Wo Contrast  Result Date: 11/26/2017 CLINICAL DATA:  Syncopal episodes, generalized weakness, focal neural deficit of greater than 6 hours, history of HIV, cryptococcal meningitis, hypertension EXAM: CT HEAD WITHOUT CONTRAST TECHNIQUE: Contiguous axial images were obtained  from the base of the skull through the vertex without intravenous contrast. Sagittal and coronal MPR images reconstructed from axial data set. COMPARISON:   06/24/2017 FINDINGS: Brain: Exam degraded by patient motion. Generalized atrophy. Normal ventricular morphology. No midline shift or mass effect. Scattered areas of white matter hypoattenuation particularly frontal regions. No intracranial hemorrhage, mass lesion, or evidence acute infarction. No extra-axial fluid collections. Vascular: Unremarkable Skull: Unremarkable Sinuses/Orbits: Clear Other: N/A IMPRESSION: White matter hypoattenuation in the frontal regions greater on RIGHT similar prior exam. No new intracranial abnormalities. Electronically Signed   By: Lavonia Dana M.D.   On: 11/26/2017 17:05    Pending Labs Unresulted Labs (From admission, onward)   Start     Ordered   11/26/17 1838  Phosphorus  Add-on,   R     11/26/17 1837   11/26/17 1613  Urine rapid drug screen (hosp performed)  STAT,   STAT     11/26/17 1613   11/26/17 1613  Urinalysis, Routine w reflex microscopic  STAT,   STAT     11/26/17 1613      Vitals/Pain Today's Vitals   11/26/17 1538 11/26/17 1630 11/26/17 1800 11/26/17 1830  BP: (!) 187/120 (!) 164/113 (!) 171/116 (!) 165/111  Pulse: 85 81 86   Resp: '15 14 13 15  '$ Temp: 97.6 F (36.4 C)     TempSrc: Oral     SpO2: 94% 100% 99% 98%    Isolation Precautions No active isolations  Medications Medications  potassium chloride SA (K-DUR,KLOR-CON) CR tablet 40 mEq (not administered)  potassium chloride 10 mEq in 100 mL IVPB (not administered)  magnesium sulfate IVPB 2 g 50 mL (not administered)  sodium chloride 0.9 % bolus 1,000 mL (1,000 mLs Intravenous New Bag/Given 11/26/17 1744)    Mobility Non-ambulatory

## 2017-11-26 NOTE — ED Notes (Signed)
ED Provider at bedside. 

## 2017-11-26 NOTE — H&P (Signed)
[facility] History and Physical  Axzel Rockhill ZOX:[id number] DOB: 1961-05-20 DOA: 11/26/2017  Referring physician:  PCP: Daiva Eves, Lisette Grinder, MD   Chief Complaint: "I'm okay."  HPI: Gail Creekmore is a 57 y.o. male past medical history significant for HIV with poor compliance, anemia and hypertension presents emergency room with weakness.  Per EMS report patient had a near syncopal event.  No loss of consciousness but weak and dizzy on standing.  She has been like this for roughly the last 2 days.  Patient denies any pain.  ED course: CT head did not show acute stroke.  Patient given some hydration.  Hospitalist consulted for admission.   Hx from chart, EDP and pt.  Review of Systems:  As per HPI otherwise 10 point review of systems negative.    Past Medical History:  Diagnosis Date  . AIDS (HCC)   . Anemia   . Anxiety   . Cellulitis of right hand 06/23/2017  . Hepatitis B   . HIV disease (HCC)   . Hypertension   . Immune deficiency disorder [facility])    Past Surgical History:  Procedure Laterality Date  . HERNIA REPAIR     Social History:  reports that he has been smoking cigarettes.  He has been smoking about 0.30 packs per day. he has never used smokeless tobacco. He reports that he drinks about 2.0 oz of alcohol per week. He reports that he uses drugs. Drug: Cocaine.  No Known Allergies  Family History  Problem Relation Age of Onset  . Hypertension Mother      Prior to Admission medications   Medication Sig Start Date End Date Taking? Authorizing Provider  ibuprofen (ADVIL,MOTRIN) 200 MG tablet Take 200-600 mg by mouth every 6 (six) hours as needed.   Yes [provider]  amLODipine (NORVASC) 10 MG tablet Take 1 tablet (10 mg total) by mouth daily. Patient not taking: Reported on 09/09/2017 07/15/17   Shirley, [place], DO  feeding supplement, ENSURE ENLIVE, (ENSURE ENLIVE) LIQD Take 237 mLs by mouth 2 (two) times daily between meals. Patient not taking:  Reported on 09/09/2017 07/14/17   Shirley, [place], DO  fluconazole (DIFLUCAN) 200 MG tablet Take 4 tablets (800 mg total) by mouth daily. 4 tablets qday x 14 days, then 2 tablets daily Patient not taking: Reported on 11/26/2017 09/09/17   Daiva Eves, Lisette Grinder, MD  polyvinyl alcohol (LIQUIFILM TEARS) 1.4 % ophthalmic solution Place 1 drop into both eyes at bedtime. Patient not taking: Reported on 09/09/2017 07/14/17   Shirley, [place], DO  sulfamethoxazole-trimethoprim (BACTRIM DS,SEPTRA DS) 800-160 MG tablet Take 1 tablet by mouth daily. Patient not taking: Reported on 11/26/2017 09/09/17   Randall Hiss, MD   Physical Exam: Vitals:   11/26/17 1630 11/26/17 1800 11/26/17 1830 11/26/17 1944  BP: (!) 164/113 (!) 171/116 (!) 165/111 (!) 137/97  Pulse: 81 86  86  Resp: 14 13 15 17   Temp:    98.3 F (36.8 C)  TempSrc:    Oral  SpO2: 100% 99% 98% 100%    Wt Readings from Last 3 Encounters:  09/09/17 57.6 kg (127 lb)  07/14/17 51.2 kg (112 lb 14.4 oz)  06/11/17 49.9 kg (110 lb)    General: Appears calm and comfortable; a&Ox3, hard of hearing; dry mouth Eyes:  PERRL, EOMI, normal lids, iris ENT:  grossly normal hearing, lips & tongue; bil cerumen impaction Neck:  no LAD, masses or thyromegaly Cardiovascular:  RRR, no m/r/g. No LE edema.  Respiratory:  CTA bilaterally, no w/r/r. Normal respiratory effort. Abdomen:  soft, ntnd Skin:  no rash or induration seen on limited exam Musculoskeletal:  grossly normal tone BUE/BLE  Psychiatric:  grossly normal mood and affect, speech fluent and appropriate Neurologic:  CN 2-12 grossly intact, moves all extremities in coordinated fashion.          Labs on Admission:  Basic Metabolic Panel: Recent Labs  Lab 11/26/17 1630 11/26/17 1653  NA 130* 134*  K 3.1* 3.2*  CL 89* 88*  CO2 28  --   GLUCOSE 109* 110*  BUN 12 11  CREATININE 0.79 0.70  CALCIUM 8.8*  --   MG 1.7  --   PHOS 3.9  --    Liver Function Tests: Recent Labs  Lab  11/26/17 1630  AST 38  ALT 78*  ALKPHOS 133*  BILITOT 1.0  PROT 8.6*  ALBUMIN 3.4*   No results for input(s): LIPASE, AMYLASE in the last 168 hours. No results for input(s): AMMONIA in the last 168 hours. CBC: Recent Labs  Lab 11/26/17 1630 11/26/17 1653  WBC 2.6*  --   NEUTROABS 2.0  --   HGB 13.6 15.0  HCT 40.0 44.0  MCV 82.3  --   PLT 100*  --    Cardiac Enzymes: No results for input(s): CKTOTAL, CKMB, CKMBINDEX, TROPONINI in the last 168 hours.  BNP (last 3 results) No results for input(s): BNP in the last 8760 hours.  ProBNP (last 3 results) No results for input(s): PROBNP in the last 8760 hours.   Creatinine clearance cannot be calculated (Unknown ideal weight.)  CBG: No results for input(s): GLUCAP in the last 168 hours.  Radiological Exams on Admission: Dg Chest 2 View  Result Date: 11/26/2017 CLINICAL DATA:  Weakness EXAM: CHEST  2 VIEW COMPARISON:  07/11/2017 chest radiograph. FINDINGS: Stable cardiomediastinal silhouette with normal heart size. No pneumothorax. No pleural effusion. Lungs appear clear, with no acute consolidative airspace disease and no pulmonary edema. IMPRESSION: No active cardiopulmonary disease. Electronically Signed   By: Delbert PhenixJason A Poff M.D.   On: 11/26/2017 17:27   Ct Head Wo Contrast  Result Date: 11/26/2017 CLINICAL DATA:  Syncopal episodes, generalized weakness, focal neural deficit of greater than 6 hours, history of HIV, cryptococcal meningitis, hypertension EXAM: CT HEAD WITHOUT CONTRAST TECHNIQUE: Contiguous axial images were obtained from the base of the skull through the vertex without intravenous contrast. Sagittal and coronal MPR images reconstructed from axial data set. COMPARISON:  06/24/2017 FINDINGS: Brain: Exam degraded by patient motion. Generalized atrophy. Normal ventricular morphology. No midline shift or mass effect. Scattered areas of white matter hypoattenuation particularly frontal regions. No intracranial hemorrhage,  mass lesion, or evidence acute infarction. No extra-axial fluid collections. Vascular: Unremarkable Skull: Unremarkable Sinuses/Orbits: Clear Other: N/A IMPRESSION: White matter hypoattenuation in the frontal regions greater on RIGHT similar prior exam. No new intracranial abnormalities. Electronically Signed   By: Ulyses SouthwardMark  Boles M.D.   On: 11/26/2017 17:05    EKG: Independently reviewed. Prolonged QT. NSR. No stemi.  Assessment/Plan Active Problems:   Prolonged QT interval  Prolonged QT and syncope Echo in the morning Correct electrolyte abnormalities Cardiac monitoring EKG in the morning Likely related to electrolyte abnormalities  Dehydration IVF overnight  Cerumen impaction Decreased hearing  BID colace Would consider irrigation in the morning  Hypertension When necessary hydralazine 10 mg IV as needed for severe blood pressure  HIV Unk compliance Last CD4 count undetectable 08/2017 ID f/u on d/c home  Anxiety No  SI or HI Consider Ativan if necessary  Chronic anemia  stable Will monitor   Code Status: FC  DVT Prophylaxis: SCDs Family Communication: none at bedside Disposition Plan: Pending Improvement  Status: tele obs  Haydee Salter, MD Family Medicine [facility] [email or web address] Password [email or web address]

## 2017-11-26 NOTE — ED Notes (Signed)
Bed: WA21 Expected date:  Expected time:  Means of arrival:  Comments: EMS-FTT

## 2017-11-26 NOTE — ED Triage Notes (Signed)
Per EMS, patient coming from home, called by family for potential syncopal episode. Patient c/o generalized weakness. Hx bacterial meningitis and HIV. Denies N/V/D.

## 2017-11-26 NOTE — ED Notes (Addendum)
Upon arrival, patient incontinent of urine with foul odor. Patient given bath, fresh linen and gown. Patient able to stand with two staff assistance.

## 2017-11-26 NOTE — Progress Notes (Signed)
Patient HOH, a&o self, appears to be poor historian at this time. Over all skin appeared to be intact, dry. Call bell placed within reach. Pt voided x 1.

## 2017-11-26 NOTE — ED Provider Notes (Signed)
Kenneth Rivas Provider Note   CSN: [id number] Arrival date & time: 11/26/17  1455     History   Chief Complaint Chief Complaint  Patient presents with  . Weakness    HPI Kenneth Rivas is a 57 y.o. male.  HPI  57 year old male with a history of AIDS presents with weakness and dizziness.  The history is difficult because the patient is very hard of hearing.  EMS reported that family called because the patient had a near syncopal episode.  Patient tells me over the last 2 days he has been feeling weak when standing and gets dizzy when standing.  He is unable to stand or walk without assistance.  First he tells me that his legs get weak but then clarifies it to be the left leg only.  This is been present for 48 hours or more.  He denies any headache, upper extremity weakness, neck pain or stiffness, fevers, chest pain, shortness of breath, or abdominal pain.  He states that at rest he feels fine.  He has not passed out.  Past Medical History:  Diagnosis Date  . AIDS (HCC)   . Anemia   . Anxiety   . Cellulitis of right hand 06/23/2017  . Hepatitis B   . HIV disease (HCC)   . Hypertension   . Immune deficiency disorder [facility])     Patient Active Problem List   Diagnosis Date Noted  . Encounter for imaging study to confirm nasogastric (NG) tube placement   . Meningitis   . Blurry vision, left eye   . Pressure injury of skin 06/26/2017  . Ptosis, left eyelid   . Acute respiratory failure with hypoxia (HCC)   . Cellulitis of right hand 06/23/2017  . 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 Lung abscess (HCC)   . Refeeding syndrome 06/18/2017  . Hypophosphatemia 06/18/2017  . Cryptococcosis (HCC)   . Protein-calorie malnutrition, severe 06/17/2017  . Abnormal chest x-ray   . AIDS (acquired immune deficiency syndrome) (HCC)   . Cavitary pneumonia   . Weakness 06/16/2017  . Cachexia associated with AIDS (HCC)   . Hypokalemia   .  Hyponatremia   . 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 HTN (hypertension) 08/17/2013  . Hepatitis B   . ELEVATED BLOOD PRESSURE 06/12/2010  . TB SKIN TEST, POSITIVE 01/12/2009    Past Surgical History:  Procedure Laterality Date  . HERNIA REPAIR         Home Medications    Prior to Admission medications   Medication Sig Start Date End Date Taking? Authorizing Provider  ibuprofen (ADVIL,MOTRIN) 200 MG tablet Take 200-600 mg by mouth every 6 (six) hours as needed.   Yes [provider]  amLODipine (NORVASC) 10 MG tablet Take 1 tablet (10 mg total) by mouth daily. Patient not taking: Reported on 09/09/2017 07/15/17   Shirley, [place], DO  feeding supplement, ENSURE ENLIVE, (ENSURE ENLIVE) LIQD Take 237 mLs by mouth 2 (two) times daily between meals. Patient not taking: Reported on 09/09/2017 07/14/17   Shirley, [place], DO  fluconazole (DIFLUCAN) 200 MG tablet Take 4 tablets (800 mg total) by mouth daily. 4 tablets qday x 14 days, then 2 tablets daily Patient not taking: Reported on 11/26/2017 09/09/17   Daiva Eves, Lisette Grinder, MD  polyvinyl alcohol (LIQUIFILM TEARS) 1.4 % ophthalmic solution Place 1 drop into both eyes at bedtime. Patient not taking: Reported on 09/09/2017 07/14/17   Shirley, [place], DO  sulfamethoxazole-trimethoprim (BACTRIM DS,SEPTRA DS)  800-160 MG tablet Take 1 tablet by mouth daily. Patient not taking: Reported on 11/26/2017 09/09/17   Daiva Eves, Lisette Grinder, MD    Family History Family History  Problem Relation Age of Onset  . Hypertension Mother     Social History Social History   Tobacco Use  . Smoking status: Current Some Day Smoker    Packs/day: 0.30    Types: Cigarettes  . Smokeless tobacco: Never Used  . Tobacco comment: pt. given quit line information  Substance Use Topics  . Alcohol use: Yes    Alcohol/week: 2.0 oz    Types: 4 Standard drinks or equivalent per week    Comment: beer  . Drug use: Yes    Types: Cocaine    Comment: crack-  last use 03/2012. no h/o IVDU, other illicits     Allergies   Patient has no known allergies.   Review of Systems Review of Systems  Constitutional: Negative for fever.  Respiratory: Negative for shortness of breath.   Cardiovascular: Negative for chest pain.  Gastrointestinal: Negative for abdominal pain and vomiting.  Genitourinary: Negative for dysuria.  Musculoskeletal: Negative for neck pain and neck stiffness.  Neurological: Positive for dizziness and weakness. Negative for headaches.  All other systems reviewed and are negative.    Physical Exam Updated Vital Signs BP (!) 171/116   Pulse 86   Temp 97.6 F (36.4 C) (Oral)   Resp 13   SpO2 99%   Physical Exam  Constitutional: He is oriented to person, place, and time. He appears well-developed. He appears cachectic.  Non-toxic appearance. He does not have a sickly appearance. He does not appear ill.  HENT:  Head: Normocephalic and atraumatic.  Right Ear: External ear normal.  Left Ear: External ear normal.  Nose: Nose normal.  Mouth/Throat: Mucous membranes are dry.  Eyes: EOM are normal. Pupils are equal, round, and reactive to light. Right eye exhibits no discharge. Left eye exhibits no discharge.  Neck: Normal range of motion. Neck supple. No neck rigidity.  Cardiovascular: Normal rate, regular rhythm and normal heart sounds.  Pulmonary/Chest: Effort normal and breath sounds normal. He has no wheezes. He has no rales.  Abdominal: Soft. There is no tenderness.  Musculoskeletal: He exhibits no edema.  Neurological: He is alert and oriented to person, place, and time.  CN 3-12 grossly intact. 5/5 strength in all 4 extremities. Grossly normal sensation. Able to sit up in bed without assistance. Had to require 2 people to help him stand up and stay up without falling  Skin: Skin is warm and dry.  Nursing note and vitals reviewed.    ED Treatments / Results  Labs (all labs ordered are listed, but only abnormal  results are displayed) Labs Reviewed  COMPREHENSIVE METABOLIC PANEL - Abnormal; Notable for the following components:      Result Value   Sodium 130 (*)    Potassium 3.1 (*)    Chloride 89 (*)    Glucose, Bld 109 (*)    Calcium 8.8 (*)    Total Protein 8.6 (*)    Albumin 3.4 (*)    ALT 78 (*)    Alkaline Phosphatase 133 (*)    All other components within normal limits  CBC WITH DIFFERENTIAL/PLATELET - Abnormal; Notable for the following components:   WBC 2.6 (*)    Platelets 100 (*)    Lymphs Abs 0.3 (*)    All other components within normal limits  I-STAT CHEM 8, ED - Abnormal;  Notable for the following components:   Sodium 134 (*)    Potassium 3.2 (*)    Chloride 88 (*)    Glucose, Bld 110 (*)    Calcium, Ion 1.09 (*)    All other components within normal limits  ETHANOL  MAGNESIUM  PROTIME-INR  APTT  RAPID URINE DRUG SCREEN, HOSP PERFORMED  URINALYSIS, ROUTINE W REFLEX MICROSCOPIC  I-STAT TROPONIN, ED    EKG  EKG Interpretation  Date/Time:  Thursday November 26 2017 16:25:53 EST Ventricular Rate:  98 PR Interval:    QRS Duration: 86 QT Interval:  401 QTC Calculation: 512 R Axis:   79 Text Interpretation:  Sinus rhythm Biatrial enlargement Minimal ST depression, inferior leads Borderline ST elevation, anterior leads Prolonged QT interval no significant change since July 2018 Confirmed by Pricilla Loveless [phone]) on 11/26/2017 4:28:23 PM       Radiology Dg Chest 2 View  Result Date: 11/26/2017 CLINICAL DATA:  Weakness EXAM: CHEST  2 VIEW COMPARISON:  07/11/2017 chest radiograph. FINDINGS: Stable cardiomediastinal silhouette with normal heart size. No pneumothorax. No pleural effusion. Lungs appear clear, with no acute consolidative airspace disease and no pulmonary edema. IMPRESSION: No active cardiopulmonary disease. Electronically Signed   By: Delbert Phenix M.D.   On: 11/26/2017 17:27   Ct Head Wo Contrast  Result Date: 11/26/2017 CLINICAL DATA:  Syncopal  episodes, generalized weakness, focal neural deficit of greater than 6 hours, history of HIV, cryptococcal meningitis, hypertension EXAM: CT HEAD WITHOUT CONTRAST TECHNIQUE: Contiguous axial images were obtained from the base of the skull through the vertex without intravenous contrast. Sagittal and coronal MPR images reconstructed from axial data set. COMPARISON:  06/24/2017 FINDINGS: Brain: Exam degraded by patient motion. Generalized atrophy. Normal ventricular morphology. No midline shift or mass effect. Scattered areas of white matter hypoattenuation particularly frontal regions. No intracranial hemorrhage, mass lesion, or evidence acute infarction. No extra-axial fluid collections. Vascular: Unremarkable Skull: Unremarkable Sinuses/Orbits: Clear Other: N/A IMPRESSION: White matter hypoattenuation in the frontal regions greater on RIGHT similar prior exam. No new intracranial abnormalities. Electronically Signed   By: Ulyses Southward M.D.   On: 11/26/2017 17:05    Procedures Procedures (including critical care time)  Angiocath insertion Performed by: Audree Camel  Consent: Verbal consent obtained. Risks and benefits: risks, benefits and alternatives were discussed Time out: Immediately prior to procedure a "time out" was called to verify the correct patient, procedure, equipment, support staff and site/side marked as required.  Preparation: Patient was prepped and draped in the usual sterile fashion.  Vein Location: left basilic  Ultrasound Guided  Gauge: 20  Normal blood return and flush without difficulty Patient tolerance: Patient tolerated the procedure well with no immediate complications.    Medications Ordered in ED Medications  potassium chloride SA (K-DUR,KLOR-CON) CR tablet 40 mEq (not administered)  potassium chloride 10 mEq in 100 mL IVPB (not administered)  sodium chloride 0.9 % bolus 1,000 mL (1,000 mLs Intravenous New Bag/Given 11/26/17 1744)     Initial Impression  / Assessment and Plan / ED Course  I have reviewed the triage vital signs and the nursing notes.  Pertinent labs & imaging results that were available during my care of the patient were reviewed by me and considered in my medical decision making (see chart for details).     There is no focal weakness on exam.  He reports left lower extremity weakness but this is not significantly present.  His lab work shows mild hypokalemia.  However he  has a new prolonged QTC over 500.  With this in association with the potassium, magnesium has been ordered and he will be given potassium replacement.  Given his weakness and inability to get up and walk on his own, he is not stable enough for discharge home.  He will be admitted to the hospitalist.  He is otherwise awake, alert, oriented and appropriate.  He denies headache, blurry vision, and has no nuchal rigidity.  My suspicion for CNS infection is low.  Final Clinical Impressions(s) / ED Diagnoses   Final diagnoses:  Hypokalemia  Prolonged Q-T interval on ECG    ED Discharge Orders    None       Pricilla LovelessGoldston, Calley Drenning, MD 11/26/17 1836

## 2017-11-26 NOTE — ED Notes (Signed)
Hospitalist at bedside 

## 2017-11-26 NOTE — ED Notes (Signed)
Patient transported to X-ray 

## 2017-11-27 ENCOUNTER — Observation Stay (HOSPITAL_BASED_OUTPATIENT_CLINIC_OR_DEPARTMENT_OTHER): Payer: Medicaid Other

## 2017-11-27 ENCOUNTER — Other Ambulatory Visit: Payer: Self-pay

## 2017-11-27 ENCOUNTER — Observation Stay (HOSPITAL_COMMUNITY): Payer: Medicaid Other

## 2017-11-27 DIAGNOSIS — I5030 Unspecified diastolic (congestive) heart failure: Secondary | ICD-10-CM | POA: Diagnosis not present

## 2017-11-27 DIAGNOSIS — Z681 Body mass index (BMI) 19 or less, adult: Secondary | ICD-10-CM | POA: Diagnosis not present

## 2017-11-27 DIAGNOSIS — R9431 Abnormal electrocardiogram [ECG] [EKG]: Secondary | ICD-10-CM | POA: Diagnosis not present

## 2017-11-27 DIAGNOSIS — B451 Cerebral cryptococcosis: Secondary | ICD-10-CM | POA: Diagnosis present

## 2017-11-27 DIAGNOSIS — D649 Anemia, unspecified: Secondary | ICD-10-CM | POA: Diagnosis not present

## 2017-11-27 DIAGNOSIS — F419 Anxiety disorder, unspecified: Secondary | ICD-10-CM | POA: Diagnosis present

## 2017-11-27 DIAGNOSIS — Z750 Medical services not available in home: Secondary | ICD-10-CM | POA: Diagnosis not present

## 2017-11-27 DIAGNOSIS — D638 Anemia in other chronic diseases classified elsewhere: Secondary | ICD-10-CM | POA: Diagnosis present

## 2017-11-27 DIAGNOSIS — Z8249 Family history of ischemic heart disease and other diseases of the circulatory system: Secondary | ICD-10-CM | POA: Diagnosis not present

## 2017-11-27 DIAGNOSIS — E43 Unspecified severe protein-calorie malnutrition: Secondary | ICD-10-CM | POA: Diagnosis present

## 2017-11-27 DIAGNOSIS — I472 Ventricular tachycardia: Secondary | ICD-10-CM | POA: Diagnosis not present

## 2017-11-27 DIAGNOSIS — H6123 Impacted cerumen, bilateral: Secondary | ICD-10-CM | POA: Diagnosis present

## 2017-11-27 DIAGNOSIS — N179 Acute kidney failure, unspecified: Secondary | ICD-10-CM | POA: Diagnosis not present

## 2017-11-27 DIAGNOSIS — B20 Human immunodeficiency virus [HIV] disease: Secondary | ICD-10-CM | POA: Diagnosis present

## 2017-11-27 DIAGNOSIS — Z9114 Patient's other noncompliance with medication regimen: Secondary | ICD-10-CM | POA: Diagnosis not present

## 2017-11-27 DIAGNOSIS — D61818 Other pancytopenia: Secondary | ICD-10-CM | POA: Diagnosis present

## 2017-11-27 DIAGNOSIS — R531 Weakness: Secondary | ICD-10-CM | POA: Diagnosis present

## 2017-11-27 DIAGNOSIS — R64 Cachexia: Secondary | ICD-10-CM | POA: Diagnosis not present

## 2017-11-27 DIAGNOSIS — E871 Hypo-osmolality and hyponatremia: Secondary | ICD-10-CM | POA: Diagnosis not present

## 2017-11-27 DIAGNOSIS — E860 Dehydration: Secondary | ICD-10-CM | POA: Diagnosis present

## 2017-11-27 DIAGNOSIS — R55 Syncope and collapse: Secondary | ICD-10-CM | POA: Diagnosis present

## 2017-11-27 DIAGNOSIS — B379 Candidiasis, unspecified: Secondary | ICD-10-CM | POA: Diagnosis present

## 2017-11-27 DIAGNOSIS — E872 Acidosis: Secondary | ICD-10-CM | POA: Diagnosis not present

## 2017-11-27 DIAGNOSIS — I10 Essential (primary) hypertension: Secondary | ICD-10-CM | POA: Diagnosis not present

## 2017-11-27 DIAGNOSIS — E222 Syndrome of inappropriate secretion of antidiuretic hormone: Secondary | ICD-10-CM | POA: Diagnosis not present

## 2017-11-27 DIAGNOSIS — F0280 Dementia in other diseases classified elsewhere without behavioral disturbance: Secondary | ICD-10-CM | POA: Diagnosis not present

## 2017-11-27 DIAGNOSIS — I509 Heart failure, unspecified: Secondary | ICD-10-CM | POA: Diagnosis not present

## 2017-11-27 DIAGNOSIS — Z8661 Personal history of infections of the central nervous system: Secondary | ICD-10-CM | POA: Diagnosis not present

## 2017-11-27 DIAGNOSIS — I5022 Chronic systolic (congestive) heart failure: Secondary | ICD-10-CM | POA: Diagnosis not present

## 2017-11-27 DIAGNOSIS — I5020 Unspecified systolic (congestive) heart failure: Secondary | ICD-10-CM | POA: Diagnosis present

## 2017-11-27 DIAGNOSIS — F17210 Nicotine dependence, cigarettes, uncomplicated: Secondary | ICD-10-CM | POA: Diagnosis not present

## 2017-11-27 DIAGNOSIS — G0490 Encephalitis and encephalomyelitis, unspecified: Secondary | ICD-10-CM | POA: Diagnosis present

## 2017-11-27 DIAGNOSIS — B1910 Unspecified viral hepatitis B without hepatic coma: Secondary | ICD-10-CM | POA: Diagnosis present

## 2017-11-27 DIAGNOSIS — I43 Cardiomyopathy in diseases classified elsewhere: Secondary | ICD-10-CM | POA: Diagnosis present

## 2017-11-27 DIAGNOSIS — R931 Abnormal findings on diagnostic imaging of heart and coronary circulation: Secondary | ICD-10-CM | POA: Diagnosis not present

## 2017-11-27 DIAGNOSIS — E876 Hypokalemia: Secondary | ICD-10-CM | POA: Diagnosis present

## 2017-11-27 DIAGNOSIS — Z79899 Other long term (current) drug therapy: Secondary | ICD-10-CM | POA: Diagnosis not present

## 2017-11-27 DIAGNOSIS — R888 Abnormal findings in other body fluids and substances: Secondary | ICD-10-CM | POA: Diagnosis not present

## 2017-11-27 DIAGNOSIS — Z609 Problem related to social environment, unspecified: Secondary | ICD-10-CM | POA: Diagnosis present

## 2017-11-27 DIAGNOSIS — F0390 Unspecified dementia without behavioral disturbance: Secondary | ICD-10-CM | POA: Diagnosis not present

## 2017-11-27 DIAGNOSIS — Z9119 Patient's noncompliance with other medical treatment and regimen: Secondary | ICD-10-CM | POA: Diagnosis not present

## 2017-11-27 DIAGNOSIS — I420 Dilated cardiomyopathy: Secondary | ICD-10-CM | POA: Diagnosis not present

## 2017-11-27 LAB — BASIC METABOLIC PANEL
ANION GAP: 10 (ref 5–15)
BUN: 14 mg/dL (ref 6–20)
CHLORIDE: 94 mmol/L — AB (ref 101–111)
CO2: 27 mmol/L (ref 22–32)
Calcium: 8.8 mg/dL — ABNORMAL LOW (ref 8.9–10.3)
Creatinine, Ser: 0.73 mg/dL (ref 0.61–1.24)
GFR calc Af Amer: >60 mL/min (ref >60)
GLUCOSE: 104 mg/dL — AB (ref 65–99)
Potassium: 3.5 mmol/L (ref 3.5–5.1)
Sodium: 131 mmol/L — ABNORMAL LOW (ref 135–145)

## 2017-11-27 LAB — CBC
HEMATOCRIT: 37.1 % — AB (ref 39.0–52.0)
HEMOGLOBIN: 12.4 g/dL — AB (ref 13.0–17.0)
MCH: 27.8 pg (ref 26.0–34.0)
MCHC: 33.4 g/dL (ref 30.0–36.0)
MCV: 83.2 fL (ref 78.0–100.0)
Platelets: 110 10*3/uL — ABNORMAL LOW (ref 150–400)
RBC: 4.46 MIL/uL (ref 4.22–5.81)
RDW: 13.3 % (ref 11.5–15.5)
WBC: 3.1 10*3/uL — AB (ref 4.0–10.5)

## 2017-11-27 LAB — PROTEIN, CSF: Total  Protein, CSF: 61 mg/dL — ABNORMAL HIGH (ref 15–45)

## 2017-11-27 LAB — CSF CELL COUNT WITH DIFFERENTIAL
Eosinophils, CSF: 1 % (ref 0–1)
Lymphs, CSF: 94 % — ABNORMAL HIGH (ref 40–80)
Monocyte-Macrophage-Spinal Fluid: 3 % — ABNORMAL LOW (ref 15–45)
RBC COUNT CSF: 1 /mm3 — AB
Segmented Neutrophils-CSF: 2 % (ref 0–6)
Tube #: 4
WBC CSF: 34 /mm3 — AB (ref 0–5)

## 2017-11-27 LAB — ECHOCARDIOGRAM COMPLETE
Height: 66 in
Weight: 1657.86 [oz_av]

## 2017-11-27 LAB — CRYPTOCOCCAL ANTIGEN, CSF
Crypto Ag: POSITIVE — AB
Cryptococcal Ag Titer: >2560 — AB

## 2017-11-27 LAB — T-HELPER CELLS (CD4) COUNT (NOT AT ARMC)

## 2017-11-27 LAB — MAGNESIUM: Magnesium: 2.2 mg/dL (ref 1.7–2.4)

## 2017-11-27 LAB — TROPONIN I
TROPONIN I: 0.03 ng/mL — AB (ref <0.03)
Troponin I: <0.03 ng/mL (ref <0.03)

## 2017-11-27 LAB — GLUCOSE, CSF: GLUCOSE CSF: 32 mg/dL — AB (ref 40–70)

## 2017-11-27 MED ORDER — POTASSIUM CHLORIDE CRYS ER 20 MEQ PO TBCR
40.0000 meq | EXTENDED_RELEASE_TABLET | Freq: Once | ORAL | Status: AC
  Administered 2017-11-27: 40 meq via ORAL
  Filled 2017-11-27: qty 2

## 2017-11-27 MED ORDER — SODIUM CHLORIDE 0.9 % IV BOLUS FOR AMBISOME
500.0000 mL | INTRAVENOUS | Status: DC
  Administered 2017-11-27 – 2017-12-04 (×8): 500 mL via INTRAVENOUS
  Filled 2017-11-27 (×5): qty 500

## 2017-11-27 MED ORDER — ENSURE ENLIVE PO LIQD
237.0000 mL | Freq: Three times a day (TID) | ORAL | Status: DC
  Administered 2017-11-27 – 2017-12-15 (×30): 237 mL via ORAL

## 2017-11-27 MED ORDER — AMPHOTERICIN B LIPOSOME 50 MG IV SUSR
5.0000 mg/kg | INTRAVENOUS | Status: DC
  Administered 2017-11-27 – 2017-12-03 (×7): 240 mg via INTRAVENOUS
  Filled 2017-11-27 (×9): qty 240

## 2017-11-27 MED ORDER — SULFAMETHOXAZOLE-TRIMETHOPRIM 800-160 MG PO TABS
1.0000 | ORAL_TABLET | Freq: Every day | ORAL | Status: DC
  Administered 2017-11-27 – 2017-12-06 (×10): 1 via ORAL
  Filled 2017-11-27 (×10): qty 1

## 2017-11-27 MED ORDER — ENSURE ENLIVE PO LIQD: 237.0000 mL | Freq: Two times a day (BID) | ORAL | Status: DC

## 2017-11-27 MED ORDER — DEXTROSE 5% FOR FLUSHING BEFORE AND AFTER AMBISOME
10.0000 mL | INTRAVENOUS | Status: DC
  Administered 2017-11-27 – 2017-12-21 (×24): 10 mL via INTRAVENOUS
  Filled 2017-11-27: qty 50
  Filled 2017-11-27: qty 10
  Filled 2017-11-27 (×6): qty 50
  Filled 2017-11-27 (×2): qty 10
  Filled 2017-11-27 (×2): qty 50
  Filled 2017-11-27 (×2): qty 10
  Filled 2017-11-27 (×2): qty 50
  Filled 2017-11-27 (×3): qty 10
  Filled 2017-11-27 (×2): qty 50
  Filled 2017-11-27: qty 10
  Filled 2017-11-27: qty 50
  Filled 2017-11-27 (×2): qty 10
  Filled 2017-11-27: qty 50

## 2017-11-27 MED ORDER — SODIUM CHLORIDE 0.9 % IV SOLN
INTRAVENOUS | Status: DC
  Administered 2017-11-27 (×2): via INTRAVENOUS
  Filled 2017-11-27 (×4): qty 1000

## 2017-11-27 MED ORDER — DIPHENHYDRAMINE HCL 25 MG PO CAPS
25.0000 mg | ORAL_CAPSULE | Freq: Every day | ORAL | Status: DC | PRN
  Filled 2017-11-27: qty 1

## 2017-11-27 MED ORDER — LIDOCAINE HCL 1 % IJ SOLN
INTRAMUSCULAR | Status: AC
  Administered 2017-11-27: 16:00:00
  Filled 2017-11-27: qty 20

## 2017-11-27 MED ORDER — MEPERIDINE HCL 25 MG/ML IJ SOLN: 25.0000 mg | INTRAMUSCULAR | Status: DC | PRN

## 2017-11-27 MED ORDER — ADULT MULTIVITAMIN W/MINERALS CH
1.0000 | ORAL_TABLET | Freq: Every day | ORAL | Status: DC
  Administered 2017-11-27 – 2017-12-24 (×27): 1 via ORAL
  Filled 2017-11-27 (×27): qty 1

## 2017-11-27 MED ORDER — DIPHENHYDRAMINE HCL 50 MG/ML IJ SOLN: 25.0000 mg | Freq: Every day | INTRAMUSCULAR | Status: DC | PRN

## 2017-11-27 MED ORDER — FLUCYTOSINE 250 MG PO CAPS
25.0000 mg/kg | ORAL_CAPSULE | Freq: Four times a day (QID) | ORAL | Status: DC
  Administered 2017-11-27 – 2017-12-22 (×92): 1250 mg via ORAL
  Filled 2017-11-27 (×106): qty 5

## 2017-11-27 MED ORDER — SODIUM CHLORIDE 0.9 % IV BOLUS (SEPSIS)
500.0000 mL | Freq: Once | INTRAVENOUS | Status: AC
  Administered 2017-11-27: 500 mL via INTRAVENOUS

## 2017-11-27 MED ORDER — DEXTROSE 5% FOR FLUSHING BEFORE AND AFTER AMBISOME
10.0000 mL | INTRAVENOUS | Status: DC
  Administered 2017-11-27 – 2017-12-05 (×9): 10 mL via INTRAVENOUS
  Filled 2017-11-27: qty 50
  Filled 2017-11-27: qty 10
  Filled 2017-11-27 (×4): qty 50
  Filled 2017-11-27 (×2): qty 10
  Filled 2017-11-27: qty 50

## 2017-11-27 NOTE — Progress Notes (Signed)
Initial Nutrition Assessment  DOCUMENTATION CODES:   Underweight, Severe malnutrition in context of chronic illness  INTERVENTION:    Ensure Enlive po TID, each supplement provides 350 kcal and 20 grams of protein  Provide MVI daily  NUTRITION DIAGNOSIS:   Severe Malnutrition related to chronic illness(HIV with non compliance) as evidenced by severe fat depletion, moderate fat depletion, severe muscle depletion.  GOAL:   Patient will meet greater than or equal to 90% of their needs  MONITOR:   PO intake, Supplement acceptance, Weight trends, Labs  REASON FOR ASSESSMENT:   Malnutrition Screening Tool    ASSESSMENT:   Pt with PMH significant for HIV/AIDS with poor compliance, drug abuse, HTN, and anemia. Presents this admission with near syncopal event. Admitted for prolonged QT interval and dehydration.    Pt very hard of hearing and unable to provide history. Kept requesting milk and orange juice but would not answer questions. 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 Weight was noted to be 112 lb during that time. Records show pt weighs 103 lb this admission showing 8% wt loss in 5 months. Insignificant for time frame. Nutrition-Focused physical exam completed. Pt continues to show severe muscle and fat depletion. Will provide pt with supplementation this stay and monitor PO intake.   Nurse had very limited information about pt as well. Awaiting to speak with family if possible.   Medications reviewed and include: colace Labs reviewed: Na 131 (H) Calcium ionized 1.09 (L)   NUTRITION - FOCUSED PHYSICAL EXAM:    Most Recent Value  Orbital Region  Moderate depletion  Upper Arm Region  Severe depletion  Thoracic and Lumbar Region  Severe depletion  Buccal Region  Moderate depletion  Temple Region  Severe depletion  Clavicle Bone Region  Severe depletion  Clavicle and Acromion Bone Region  Severe depletion  Scapular  Bone Region  Severe depletion  Dorsal Hand  Moderate depletion  Patellar Region  Severe depletion  Anterior Thigh Region  Severe depletion  Posterior Calf Region  Severe depletion  Edema (RD Assessment)  None  Hair  Reviewed  Eyes  Reviewed  Mouth  Reviewed  Skin  Reviewed  Nails  Reviewed     Diet Order:  Diet NPO time specified  EDUCATION NEEDS:   Not appropriate for education at this time  Skin:  Skin Assessment: Reviewed RN Assessment  Last BM:  PTA  Height:   Ht Readings from Last 1 Encounters:  11/27/17 5\' 6"  (1.676 m)    Weight:   Wt Readings from Last 1 Encounters:  11/27/17 103 lb 9.9 oz (47 kg)    Ideal Body Weight:  70 kg  BMI:  Body mass index is 16.72 kg/m.  Estimated Nutritional Needs:   Kcal:  2100-2300 kcal/day  Protein:  100-110 g/day  Fluid:  >2.1 L/day    Vanessa Kickarly Nivia Gervase RD, LDN Clinical Nutrition Pager # [phone]

## 2017-11-27 NOTE — Consult Note (Addendum)
[facility]  Total days of antibiotics 1        Day 1 sulfa               Reason for Consult: syncope, poorly controlled hiv disease, hx CM   Referring Physician: Grandville Silos  Principal Problem:   Prolonged QT interval Active Problems:   HTN (hypertension)   Hypokalemia   AIDS (acquired immune deficiency syndrome) (HCC)    HPI: Kenneth Rivas is a 57 y.o. 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 He was last seen in clinic in late October by Dr Kenneth Rivas and Kenneth Rivas who started him on fluconazole '800mg'$  daily since he was resistant to being admitted for reinduction, but only received 4 wk of meds and did not follow up to have ADAP paperwork completed. He was admitted from home yesterday for syncopal event? Possible seizure activity since was found to have urinated on himself.he was poor historian to the symptoms that brought him to the hospital. His friend, Kenneth Rivas (another [facility] clinic patient) mentions that [name] is so weak that he is unable to walk. Patient is sleepy since undergoing LP.   Past Medical History:  Diagnosis Date  . AIDS ([place])   . Anemia   . Anxiety   . Cellulitis of right hand 06/23/2017  . Hepatitis B   . HIV disease ([place])   . Hypertension   . Immune deficiency disorder (HCC)     Allergies: No Known Allergies  MEDICATIONS: . docusate  25 mg Both EARS BID  . feeding supplement (ENSURE ENLIVE)  237 mL Oral BID BM  . sodium chloride flush  3 mL Intravenous Q12H  . sulfamethoxazole-trimethoprim  1 tablet Oral Daily    Social History   Tobacco Use  . Smoking status: Current Some Day Smoker    Packs/day: 0.30    Types: Cigarettes  . Smokeless tobacco: Never Used  . Tobacco comment: pt. given quit line information  Substance Use Topics  . Alcohol use: Yes    Alcohol/week: 2.0 oz   Types: 4 Standard drinks or equivalent per week    Comment: beer  . Drug use: Yes    Types: Cocaine    Comment: crack- last use 03/2012. no h/o IVDU, other illicits    Family History  Problem Relation Age of Onset  . Hypertension Mother     Review of Systems - Unable to obtain since patient is mildy encephalopathic since LP  OBJECTIVE: Temp:  [97.6 F (36.4 C)-98.6 F (37 C)] 98.6 F (37 C) (01/11 0517) Pulse Rate:  [81-118] 118 (01/11 0517) Resp:  [13-17] 16 (01/11 0517) BP: (137-187)/(97-120) 143/111 (01/11 0517) SpO2:  [94 %-100 %] 99 % (01/11 0517) Weight:  [103 lb 9.9 oz (47 kg)] 103 lb 9.9 oz (47 kg) (01/11 0517) Physical Exam  Constitutional: He is oriented to person, only. He appears cachetic, disheveled, smells of urine. No distress.  HENT:  Mouth/Throat: Oropharynx is dry with thrush noted to soft and hard palate and tongue Cardiovascular: Normal rate, regular rhythm and normal heart sounds. Exam reveals no gallop and no friction rub.  No murmur heard.  Pulmonary/Chest: Effort normal and breath sounds normal. No respiratory distress. He has no wheezes.  Abdominal: Soft. Scaphoid abdomen, + BS, Lymphadenopathy: + cervical adenopathy.  Neurological: He is alert  and oriented to person, only. Hard of hearing Skin: Skin is dry and scaly. No rash noted. No erythema.  Psychiatric: He is solomnent during my exam    LABS: Results for orders placed or performed during the hospital encounter of 11/26/17 (from the past 48 hour(s))  Ethanol     Status: None   Collection Time: 11/26/17  4:30 PM  Result Value Ref Range   Alcohol, Ethyl (B) <10 <10 mg/dL    Comment:        LOWEST DETECTABLE LIMIT FOR SERUM ALCOHOL IS 10 mg/dL FOR MEDICAL PURPOSES ONLY   Comprehensive metabolic panel     Status: Abnormal   Collection Time: 11/26/17  4:30 PM  Result Value Ref Range   Sodium 130 (L) 135 - 145 mmol/L   Potassium 3.1 (L) 3.5 - 5.1 mmol/L   Chloride 89 (L) 101 - 111 mmol/L     CO2 28 22 - 32 mmol/L   Glucose, Bld 109 (H) 65 - 99 mg/dL   BUN 12 6 - 20 mg/dL   Creatinine, Ser 0.79 0.61 - 1.24 mg/dL   Calcium 8.8 (L) 8.9 - 10.3 mg/dL   Total Protein 8.6 (H) 6.5 - 8.1 g/dL   Albumin 3.4 (L) 3.5 - 5.0 g/dL   AST 38 15 - 41 U/L   ALT 78 (H) 17 - 63 U/L   Alkaline Phosphatase 133 (H) 38 - 126 U/L   Total Bilirubin 1.0 0.3 - 1.2 mg/dL   GFR calc non Af Amer >60 >60 mL/min   GFR calc Af Amer >60 >60 mL/min    Comment: (NOTE) The eGFR has been calculated using the CKD EPI equation. This calculation has not been validated in all clinical situations. eGFR's persistently <60 mL/min signify possible Chronic Kidney Disease.    Anion gap 13 5 - 15  CBC WITH DIFFERENTIAL     Status: Abnormal   Collection Time: 11/26/17  4:30 PM  Result Value Ref Range   WBC 2.6 (L) 4.0 - 10.5 K/uL   RBC 4.86 4.22 - 5.81 MIL/uL   Hemoglobin 13.6 13.0 - 17.0 g/dL   HCT 40.0 39.0 - 52.0 %   MCV 82.3 78.0 - 100.0 fL   MCH 28.0 26.0 - 34.0 pg   MCHC 34.0 30.0 - 36.0 g/dL   RDW 13.4 11.5 - 15.5 %   Platelets 100 (L) 150 - 400 K/uL    Comment: REPEATED TO VERIFY SPECIMEN CHECKED FOR CLOTS PLATELET COUNT CONFIRMED BY SMEAR    Neutrophils Relative % 77 %   Neutro Abs 2.0 1.7 - 7.7 K/uL   Lymphocytes Relative 10 %   Lymphs Abs 0.3 (L) 0.7 - 4.0 K/uL   Monocytes Relative 12 %   Monocytes Absolute 0.3 0.1 - 1.0 K/uL   Eosinophils Relative 1 %   Eosinophils Absolute 0.0 0.0 - 0.7 K/uL   Basophils Relative 0 %   Basophils Absolute 0.0 0.0 - 0.1 K/uL  Magnesium     Status: None   Collection Time: 11/26/17  4:30 PM  Result Value Ref Range   Magnesium 1.7 1.7 - 2.4 mg/dL  Phosphorus     Status: None   Collection Time: 11/26/17  4:30 PM  Result Value Ref Range   Phosphorus 3.9 2.5 - 4.6 mg/dL  I-stat troponin, ED     Status: None   Collection Time: 11/26/17  4:51 PM  Result Value Ref Range   Troponin i, poc 0.02 0.00 - 0.08 ng/mL   Comment  3            Comment: Due to the  release kinetics of cTnI, a negative result within the first hours of the onset of symptoms does not rule out myocardial infarction with certainty. If myocardial infarction is still suspected, repeat the test at appropriate intervals.   I-Stat Chem 8, ED     Status: Abnormal   Collection Time: 11/26/17  4:53 PM  Result Value Ref Range   Sodium 134 (L) 135 - 145 mmol/L   Potassium 3.2 (L) 3.5 - 5.1 mmol/L   Chloride 88 (L) 101 - 111 mmol/L   BUN 11 6 - 20 mg/dL   Creatinine, Ser 0.70 0.61 - 1.24 mg/dL   Glucose, Bld 110 (H) 65 - 99 mg/dL   Calcium, Ion 1.09 (L) 1.15 - 1.40 mmol/L   TCO2 31 22 - 32 mmol/L   Hemoglobin 15.0 13.0 - 17.0 g/dL   HCT 44.0 39.0 - 52.0 %  Protime-INR     Status: None   Collection Time: 11/26/17  5:00 PM  Result Value Ref Range   Prothrombin Time 12.6 11.4 - 15.2 seconds   INR 0.95   APTT     Status: None   Collection Time: 11/26/17  5:00 PM  Result Value Ref Range   aPTT 32 24 - 36 seconds  Urine rapid drug screen (hosp performed)     Status: Abnormal   Collection Time: 11/26/17  9:09 PM  Result Value Ref Range   Opiates NONE DETECTED NONE DETECTED   Cocaine POSITIVE (A) NONE DETECTED   Benzodiazepines NONE DETECTED NONE DETECTED   Amphetamines NONE DETECTED NONE DETECTED   Tetrahydrocannabinol NONE DETECTED NONE DETECTED   Barbiturates NONE DETECTED NONE DETECTED    Comment: (NOTE) DRUG SCREEN FOR MEDICAL PURPOSES ONLY.  IF CONFIRMATION IS NEEDED FOR ANY PURPOSE, NOTIFY LAB WITHIN 5 DAYS. LOWEST DETECTABLE LIMITS FOR URINE DRUG SCREEN Drug Class                     Cutoff (ng/mL) Amphetamine and metabolites    1000 Barbiturate and metabolites    200 Benzodiazepine                 366 Tricyclics and metabolites     300 Opiates and metabolites        300 Cocaine and metabolites        300 THC                            50   Urinalysis, Routine w reflex microscopic     Status: Abnormal   Collection Time: 11/26/17  9:09 PM  Result Value  Ref Range   Color, Urine YELLOW YELLOW   APPearance CLEAR CLEAR   Specific Gravity, Urine 1.017 1.005 - 1.030   pH 6.0 5.0 - 8.0   Glucose, UA 50 (A) NEGATIVE mg/dL   Hgb urine dipstick SMALL (A) NEGATIVE   Bilirubin Urine NEGATIVE NEGATIVE   Ketones, ur 20 (A) NEGATIVE mg/dL   Protein, ur 100 (A) NEGATIVE mg/dL   Nitrite NEGATIVE NEGATIVE   Leukocytes, UA NEGATIVE NEGATIVE   RBC / HPF 0-5 0 - 5 RBC/hpf   WBC, UA 0-5 0 - 5 WBC/hpf   Bacteria, UA NONE SEEN NONE SEEN   Squamous Epithelial / LPF 0-5 (A) NONE SEEN   Mucus PRESENT    Hyaline Casts, UA PRESENT    Granular Casts,  UA PRESENT   Basic metabolic panel     Status: Abnormal   Collection Time: 11/27/17  4:43 AM  Result Value Ref Range   Sodium 131 (L) 135 - 145 mmol/L   Potassium 3.5 3.5 - 5.1 mmol/L   Chloride 94 (L) 101 - 111 mmol/L   CO2 27 22 - 32 mmol/L   Glucose, Bld 104 (H) 65 - 99 mg/dL   BUN 14 6 - 20 mg/dL   Creatinine, Ser 0.73 0.61 - 1.24 mg/dL   Calcium 8.8 (L) 8.9 - 10.3 mg/dL   GFR calc non Af Amer >60 >60 mL/min   GFR calc Af Amer >60 >60 mL/min    Comment: (NOTE) The eGFR has been calculated using the CKD EPI equation. This calculation has not been validated in all clinical situations. eGFR's persistently <60 mL/min signify possible Chronic Kidney Disease.    Anion gap 10 5 - 15  CBC     Status: Abnormal   Collection Time: 11/27/17  4:43 AM  Result Value Ref Range   WBC 3.1 (L) 4.0 - 10.5 K/uL   RBC 4.46 4.22 - 5.81 MIL/uL   Hemoglobin 12.4 (L) 13.0 - 17.0 g/dL   HCT 37.1 (L) 39.0 - 52.0 %   MCV 83.2 78.0 - 100.0 fL   MCH 27.8 26.0 - 34.0 pg   MCHC 33.4 30.0 - 36.0 g/dL   RDW 13.3 11.5 - 15.5 %   Platelets 110 (L) 150 - 400 K/uL    Comment: CONSISTENT WITH PREVIOUS RESULT  Magnesium     Status: None   Collection Time: 11/27/17  8:53 AM  Result Value Ref Range   Magnesium 2.2 1.7 - 2.4 mg/dL  Troponin I (q 6hr x 3)     Status: Abnormal   Collection Time: 11/27/17  8:53 AM  Result Value Ref  Range   Troponin I 0.03 (HH) <0.03 ng/mL    Comment: CRITICAL RESULT CALLED TO, READ BACK BY AND VERIFIED WITH: R.CORKIN RN [id number] [email or web address]     MICRO:  IMAGING: Dg Chest 2 View  Result Date: 11/26/2017 CLINICAL DATA:  Weakness EXAM: CHEST  2 VIEW COMPARISON:  07/11/2017 chest radiograph. FINDINGS: Stable cardiomediastinal silhouette with normal heart size. No pneumothorax. No pleural effusion. Lungs appear clear, with no acute consolidative airspace disease and no pulmonary edema. IMPRESSION: No active cardiopulmonary disease. Electronically Signed   By: Ilona Sorrel M.D.   On: 11/26/2017 17:27   Ct Head Wo Contrast  Result Date: 11/26/2017 CLINICAL DATA:  Syncopal episodes, generalized weakness, focal neural deficit of greater than 6 hours, history of HIV, cryptococcal meningitis, hypertension EXAM: CT HEAD WITHOUT CONTRAST TECHNIQUE: Contiguous axial images were obtained from the base of the skull through the vertex without intravenous contrast. Sagittal and coronal MPR images reconstructed from axial data set. COMPARISON:  06/24/2017 FINDINGS: Brain: Exam degraded by patient motion. Generalized atrophy. Normal ventricular morphology. No midline shift or mass effect. Scattered areas of white matter hypoattenuation particularly frontal regions. No intracranial hemorrhage, mass lesion, or evidence acute infarction. No extra-axial fluid collections. Vascular: Unremarkable Skull: Unremarkable Sinuses/Orbits: Clear Other: N/A IMPRESSION: White matter hypoattenuation in the frontal regions greater on RIGHT similar prior exam. No new intracranial abnormalities. Electronically Signed   By: Lavonia Dana M.D.   On: 11/26/2017 17:05    HISTORICAL MICRO/IMAGING  Assessment/Plan:   - will check serum cr ag to see if any trend down if he has been taking fluconazole '800mg'$  though he only took it  for 4 weeks per his friends report. Suspect it is still exceedingly high. We will plan to reinitiate with  L-ampho and flucytosine - will recommend to repeat LP, and check CSF Cr Ag plus culture, and check for RPR and JC virus  - HIV disease= poorly controlled. For now, will defer ART until can redo induction for CM  - OI proph = will do weekly azithromycin ( will start on Monday after repeat EKG to see that he does not have prolonged QT) and daily bactrim  - thrush = will be treated with ampho  - severe protein caloric malnutrition = recommend nutrition evaluation. Concern that he may have refeeding syndrome.   Dr Megan Salon to see tomorrow.

## 2017-11-27 NOTE — Progress Notes (Addendum)
CRITICAL VALUE ALERT  Critical Value:  Yeast in CSF   Date & Time Notied:  11/27/17 1752     Provider Notified: Ramiro Harvesthompson, Daniel   Orders Received/Actions taken: orders already in place

## 2017-11-27 NOTE — Evaluation (Signed)
Physical Therapy Evaluation Patient Details Name: Kenneth Rivas MRN: [id number] DOB: 1961-06-17 Today's Date: 11/27/2017   History of Present Illness  57 yo male admitted with weakness, near syncopal episode. Hx of HIV/AIDS, Hep B, drug abuse, noncompliance, cachexia, anemia.     Clinical Impression  Limited eval. Pt required Min assist for bed mobility. He sat EOB for a few minutes before stating "I can't sit up. Im going to fall." He refused to attempt standing. He then abruptly returned to supine despite cues/encouragement from therapist to try to continue to participate. Asked pt multiple times if he was dizzy but he never responded to questioning. There is some difficulty with communication due to pt's hearing loss. No family present during session. At this time, do not feel pt is able to return to boarding house. Will recommend SNF. Will continue to follow and progress activity as able/pt will allow.     Follow Up Recommendations SNF    Equipment Recommendations  None recommended by PT    Recommendations for Other Services       Precautions / Restrictions Precautions Precautions: Fall Restrictions Weight Bearing Restrictions: No      Mobility  Bed Mobility Overal bed mobility: Needs Assistance Bed Mobility: Supine to Sit;Sit to Supine     Supine to sit: Min assist;HOB elevated Sit to supine: Min guard   General bed mobility comments: Assist to get to EOB. Pt sat for 2-3 minutes before stating "Im going to fall" and abruptly returned to supine despite encouragement from therapist.   Transfers                 General transfer comment: Pt refused to attempt standing-stated "I cant stand" repeatedly. Max encouragement from therapist for pt to attempt at least once but he refused.   Ambulation/Gait                Stairs            Wheelchair Mobility    Modified Rankin (Stroke Patients Only)       Balance Overall balance assessment: Needs  assistance Sitting-balance support: Bilateral upper extremity supported;Feet supported Sitting balance-Leahy Scale: Fair                                       Pertinent Vitals/Pain Pain Assessment: No/denies pain    Home Living Family/patient expects to be discharged to:: Unsure     Type of Home: (boarding house)           Additional Comments: info taken from previous admission. difficult to get info from pt.     Prior Function           Comments: unsure of PLOF. difficult to get PLOF from pt     Hand Dominance        Extremity/Trunk Assessment   Upper Extremity Assessment Upper Extremity Assessment: Generalized weakness    Lower Extremity Assessment Lower Extremity Assessment: Generalized weakness    Cervical / Trunk Assessment Cervical / Trunk Assessment: Kyphotic  Communication   Communication: HOH  Cognition Arousal/Alertness: Awake/alert Behavior During Therapy: WFL for tasks assessed/performed Overall Cognitive Status: No family/caregiver present to determine baseline cognitive functioning  General Comments      Exercises     Assessment/Plan    PT Assessment Patient needs continued PT services  PT Problem List Decreased balance;Decreased mobility;Decreased activity tolerance;Decreased strength;Decreased knowledge of use of DME       PT Treatment Interventions DME instruction;Gait training;Functional mobility training;Therapeutic activities;Balance training;Patient/family education;Therapeutic exercise    PT Goals (Current goals can be found in the Care Plan section)  Acute Rehab PT Goals Patient Stated Goal: none stated PT Goal Formulation: With patient Time For Goal Achievement: 12/11/17 Potential to Achieve Goals: Fair    Frequency Min 2X/week   Barriers to discharge        Co-evaluation               AM-PAC PT "6 Clicks" Daily Activity  Outcome  Measure Difficulty turning over in bed (including adjusting bedclothes, sheets and blankets)?: A Little Difficulty moving from lying on back to sitting on the side of the bed? : Unable Difficulty sitting down on and standing up from a chair with arms (e.g., wheelchair, bedside commode, etc,.)?: Unable Help needed moving to and from a bed to chair (including a wheelchair)?: Total Help needed walking in hospital room?: Total Help needed climbing 3-5 steps with a railing? : Total 6 Click Score: 8    End of Session   Activity Tolerance: Other (comment)(Limited by pt's fear of falling and unwillingness to try to mobilize) Patient left: in bed;with call bell/phone within reach;with bed alarm set   PT Visit Diagnosis: Muscle weakness (generalized) (M62.81);Difficulty in walking, not elsewhere classified (R26.2)    Time: 0865-7846 PT Time Calculation (min) (ACUTE ONLY): 8 min   Charges:   PT Evaluation $PT Eval Moderate Complexity: 1 Mod     PT G Codes:          Rebeca Alert, MPT Pager: [phone]

## 2017-11-27 NOTE — Progress Notes (Signed)
Echocardiogram 2D Echocardiogram has been performed.  Leta JunglingCooper, Chloe Miyoshi M 11/27/2017, 8:30 AM

## 2017-11-27 NOTE — Progress Notes (Signed)
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  Patient has been lost to follow-up in the ID clinic.  Vision presented to the ED with generalized weakness and per EMS report patient noted to have a near syncopal event.  No loss of consciousness however with complaints of weakness and dizziness on standing.  CT head which was done was negative.  Patient also noted to have QTC prolongation.  Patient admitted for further evaluation and management.  Patient for lumbar puncture per interventional radiology 11/27/2017.   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  QT prolongation has resolved.  Patient on IV fluids.  Patient noted to have a prior history of meningitis due to cryptococcus species with medical noncompliance and concern for partially treated infection.  Patient also noted to be noncompliant with medications.  Electrolytes have been repleted.  2D echo which was obtained with poor acoustic windows and unable to assess LV function or wall motion.  We will repeat 2D echo with Definity.  IV fluids.  Supportive care.  2.  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  Patient last seen  in ID clinic in October 2018.  At that time patient was to be started on fluconazole 800 mg daily however unknown as to whether patient actually completed his course of treatment.  Patient underwent lumbar puncture today with results pending.  ID has consulted on the patient and patient has been started on flucytosine as well as amphotericin B.  ID following and appreciate their input and recommendations.  3.  AIDS Patient noted to be HIV positive.  Patient has not been started on any ART therapy.  Concern about medical compliance.  Last viral load from 09/09/2017 was 155,000.  Check a CD4 count.  Check a viral load.  Continue Bactrim for prophylaxis.  ID consulted and are following.  4.  Prolonged QT interval Replete electrolytes.  Repeat EKG with resolution of QT prolongation.  Follow.  5.  Hypokalemia/hyponatremia Replete.  Continue IV fluids.  Follow.  6.  Severe protein calorie malnutrition Placed on Ensure supplementation.  Consult with dietitian.  7.  Hypertension Stable.  8.  Cerumen impaction Patient noted to have decreased hearing.  Irrigation.  9.  Dehydration IV fluids.  10.  Anxiety No suicidal or homicidal ideation.  Follow.  11.  Chronic anemia Stable.  Follow H&H.   DVT prophylaxis: SCDs Code Status: Full Family Communication: No family at bedside. Disposition Plan: To be determined.   Consultants:   Infectious diseases: Dr. Drue Second 11/27/2017  Procedures:   Lumbar puncture per interventional radiology 11/27/2017  2D echo 11/27/2017  CT head without contrast 11/26/2017  Chest x-ray 11/26/2017    Antimicrobials:  None   Subjective: Sleeping opens eyes to verbal stimuli however drifts back off to sleep.  Drowsy.  Objective: Vitals:   11/26/17 1800 11/26/17 1830 11/26/17 1944 11/27/17 0517  BP: (!) 171/116 (!) 165/111 (!) 137/97 (!) 143/111  Pulse: 86  86 (!) 118  Resp: 13 15 17 16   Temp:   98.3 F (36.8 C) 98.6 F (37 C)  TempSrc:   Oral  Oral  SpO2: 99% 98% 100% 99%  Weight:    47 kg (103 lb 9.9 oz)  Height:    5\' 6"  (1.676 m)    Intake/Output Summary (Last 24 hours) at 11/27/2017 1823 Last data filed at 11/27/2017 1400 Gross per 24 hour  Intake 1463.34 ml  Output -  Net 1463.34 ml   Filed Weights   11/27/17 0517  Weight: 47 kg (103 lb 9.9 oz)    Examination:  General exam: Sleeping.  Lethargic.  Cachectic.  Frail.  Chronically ill appearing.  Temporal wasting. Respiratory system: Clear to auscultation.  No wheezes, no crackles, no rhonchi respiratory effort normal. Cardiovascular system: S1 & S2 heard, RRR. No JVD, murmurs, rubs, gallops or clicks. No pedal edema. Gastrointestinal system: Abdomen is nondistended, soft and nontender, scaphoid.. No organomegaly or masses felt. Normal bowel sounds heard. Central nervous system: Sleeping however easily arousable but drowsy and drifts back off to sleep.  Moving extremities spontaneously.  Extremities: Symmetric 5 x 5 power. Skin: No rashes, lesions or ulcers Psychiatry: Judgement and insight appear poor. Mood & affect appropriate.     Data Reviewed: I have personally reviewed following labs and imaging studies  CBC: Recent Labs  Lab 11/26/17 1630 11/26/17 1653 11/27/17 0443  WBC 2.6*  --  3.1*  NEUTROABS 2.0  --   --   HGB 13.6 15.0 12.4*  HCT 40.0 44.0 37.1*  MCV 82.3  --  83.2  PLT 100*  --  110*   Basic Metabolic Panel: Recent Labs  Lab 11/26/17 1630 11/26/17 1653 11/27/17 0443 11/27/17 0853  NA 130* 134* 131*  --   K 3.1* 3.2* 3.5  --   CL 89* 88* 94*  --   CO2 28  --  27  --   GLUCOSE 109* 110* 104*  --   BUN 12 11 14   --   CREATININE 0.79 0.70 0.73  --   CALCIUM 8.8*  --  8.8*  --   MG 1.7  --   --  2.2  PHOS 3.9  --   --   --    GFR: Estimated Creatinine Clearance: 66.9 mL/min (by C-G formula based on SCr of 0.73 mg/dL). Liver Function Tests: Recent Labs  Lab 11/26/17 1630  AST 38  ALT 78*  ALKPHOS 133*  BILITOT 1.0  PROT  8.6*  ALBUMIN 3.4*   No results for input(s): LIPASE, AMYLASE in the last 168 hours. No results for input(s): AMMONIA in the last 168 hours. Coagulation Profile: Recent Labs  Lab 11/26/17 1700  INR 0.95   Cardiac Enzymes: Recent Labs  Lab 11/27/17 0853 11/27/17 1608  TROPONINI 0.03* <0.03   BNP (last 3 results) No results for input(s): PROBNP in the last 8760 hours. HbA1C: No results for input(s): HGBA1C in the last 72 hours. CBG: No results for input(s): GLUCAP in the last 168 hours. Lipid Profile: No results for input(s): CHOL, HDL, LDLCALC, TRIG, CHOLHDL, LDLDIRECT in the last 72 hours. Thyroid Function Tests: No results for input(s): TSH, T4TOTAL, FREET4, T3FREE, THYROIDAB in the last 72  hours. Anemia Panel: No results for input(s): VITAMINB12, FOLATE, FERRITIN, TIBC, IRON, RETICCTPCT in the last 72 hours. Sepsis Labs: No results for input(s): PROCALCITON, LATICACIDVEN in the last 168 hours.  Recent Results (from the past 240 hour(s))  CSF culture     Status: None (Preliminary result)   Collection Time: 11/27/17  3:33 PM  Result Value Ref Range Status   Specimen Description CSF  Final   Special Requests NONE  Final   Gram Stain   Final    WBC PRESENT, PREDOMINANTLY MONONUCLEAR YEAST PRESENT CYTOSPIN SMEAR CRITICAL RESULT CALLED TO, READ BACK BY AND VERIFIED WITH: R.CORCORAN AT 1752 ON 11/27/17 BY N.Bartosz Luginbill    Culture PENDING  Incomplete   Report Status PENDING  Incomplete         Radiology Studies: Dg Chest 2 View  Result Date: 11/26/2017 CLINICAL DATA:  Weakness EXAM: CHEST  2 VIEW COMPARISON:  07/11/2017 chest radiograph. FINDINGS: Stable cardiomediastinal silhouette with normal heart size. No pneumothorax. No pleural effusion. Lungs appear clear, with no acute consolidative airspace disease and no pulmonary edema. IMPRESSION: No active cardiopulmonary disease. Electronically Signed   By: Delbert PhenixJason A Poff M.D.   On: 11/26/2017 17:27   Ct Head Wo  Contrast  Result Date: 11/26/2017 CLINICAL DATA:  Syncopal episodes, generalized weakness, focal neural deficit of greater than 6 hours, history of HIV, cryptococcal meningitis, hypertension EXAM: CT HEAD WITHOUT CONTRAST TECHNIQUE: Contiguous axial images were obtained from the base of the skull through the vertex without intravenous contrast. Sagittal and coronal MPR images reconstructed from axial data set. COMPARISON:  06/24/2017 FINDINGS: Brain: Exam degraded by patient motion. Generalized atrophy. Normal ventricular morphology. No midline shift or mass effect. Scattered areas of white matter hypoattenuation particularly frontal regions. No intracranial hemorrhage, mass lesion, or evidence acute infarction. No extra-axial fluid collections. Vascular: Unremarkable Skull: Unremarkable Sinuses/Orbits: Clear Other: N/A IMPRESSION: White matter hypoattenuation in the frontal regions greater on RIGHT similar prior exam. No new intracranial abnormalities. Electronically Signed   By: Ulyses SouthwardMark  Boles M.D.   On: 11/26/2017 17:05   Dg Fluoro Guide Lumbar Puncture  Result Date: 11/27/2017 CLINICAL DATA:  AIDS.  Evaluate for cryptococcal meningitis. EXAM: DIAGNOSTIC LUMBAR PUNCTURE UNDER FLUOROSCOPIC GUIDANCE FLUOROSCOPY TIME:  Fluoroscopy Time:  1 minute 14 seconds Radiation Exposure Index (if provided by the fluoroscopic device): 10 mGy Number of Acquired Spot Images: 1 PROCEDURE: Informed consent was obtained from the patient prior to the procedure, including potential complications of headache, allergy, and pain. With the patient prone, the lower back was prepped with Betadine. 1% Lidocaine was used for local anesthesia. Lumbar puncture was performed at the L2-3 level using a 20 gauge needle with return of clear CSF with an opening pressure of 36 cm water. 9.5 ml of CSF were obtained for laboratory studies. The patient tolerated the procedure well and there were no apparent complications. IMPRESSION: 1. Successful  lumbar puncture at L2-3. 2. Opening pressure of 36 cm water.  9.5 cc of CSF collected. Electronically Signed   By: Signa Kellaylor  Stroud M.D.   On: 11/27/2017 15:43        Scheduled Meds: . dextrose  10 mL Intravenous Q24H  . dextrose  10 mL Intravenous Q24H  . docusate  25 mg Both EARS BID  . feeding supplement (ENSURE ENLIVE)  237 mL Oral TID BM  . flucytosine  25 mg/kg Oral Q6H  . multivitamin with minerals  1 tablet Oral Daily  . sodium chloride  500 mL Intravenous Q24H  .  sodium chloride  500 mL Intravenous Q24H  . sodium chloride flush  3 mL Intravenous Q12H  . sulfamethoxazole-trimethoprim  1 tablet Oral Daily   Continuous Infusions: . amphotericin  B  Liposome (AMBISOME) ADULT IV 240 mg (11/27/17 1805)  . sodium chloride 0.9 % 1,000 mL infusion 100 mL/hr at 11/27/17 0914     LOS: 0 days    Time spent: 40 minutes    Ramiro Harvest, MD [facility] Pager [phone]  If 7PM-7AM, please contact night-coverage [email or web address] Password [place] 11/27/2017, 6:23 PM

## 2017-11-28 ENCOUNTER — Inpatient Hospital Stay (HOSPITAL_COMMUNITY): Payer: Medicaid Other

## 2017-11-28 DIAGNOSIS — Z681 Body mass index (BMI) 19 or less, adult: Secondary | ICD-10-CM

## 2017-11-28 DIAGNOSIS — R55 Syncope and collapse: Secondary | ICD-10-CM

## 2017-11-28 DIAGNOSIS — R64 Cachexia: Secondary | ICD-10-CM

## 2017-11-28 DIAGNOSIS — D649 Anemia, unspecified: Secondary | ICD-10-CM | POA: Clinically undetermined

## 2017-11-28 LAB — CBC WITH DIFFERENTIAL/PLATELET
BASOS ABS: 0 10*3/uL (ref 0.0–0.1)
BASOS PCT: 0 %
EOS ABS: 0.1 10*3/uL (ref 0.0–0.7)
Eosinophils Relative: 2 %
HCT: 29.2 % — ABNORMAL LOW (ref 39.0–52.0)
Hemoglobin: 9.5 g/dL — ABNORMAL LOW (ref 13.0–17.0)
LYMPHS PCT: 9 %
Lymphs Abs: 0.3 10*3/uL — ABNORMAL LOW (ref 0.7–4.0)
MCH: 27.1 pg (ref 26.0–34.0)
MCHC: 32.5 g/dL (ref 30.0–36.0)
MCV: 83.2 fL (ref 78.0–100.0)
MONO ABS: 0.3 10*3/uL (ref 0.1–1.0)
Monocytes Relative: 9 %
Neutro Abs: 2.6 10*3/uL (ref 1.7–7.7)
Neutrophils Relative %: 80 %
PLATELETS: 73 10*3/uL — AB (ref 150–400)
RBC: 3.51 MIL/uL — AB (ref 4.22–5.81)
RDW: 13.6 % (ref 11.5–15.5)
WBC: 3.3 10*3/uL — AB (ref 4.0–10.5)

## 2017-11-28 LAB — IRON AND TIBC
IRON: 52 ug/dL (ref 45–182)
SATURATION RATIOS: 24 % (ref 17.9–39.5)
TIBC: 216 ug/dL — ABNORMAL LOW (ref 250–450)
UIBC: 164 ug/dL

## 2017-11-28 LAB — HIV-1 RNA QUANT-NO REFLEX-BLD
HIV 1 RNA QUANT: 56200 {copies}/mL
LOG10 HIV-1 RNA: 4.75 {Log_copies}/mL

## 2017-11-28 LAB — ECHOCARDIOGRAM LIMITED
CHL CUP MV DEC (S): 211
E decel time: 211 msec
FS: 7 % — AB (ref 28–44)
Height: 66 in
IV/PV OW: 1.01
LA ID, A-P, ES: 30 mm
LADIAMINDEX: 1.97 cm/m2
LDCA: 3.8 cm2
LEFT ATRIUM END SYS DIAM: 30 mm
LV PW d: 10.6 mm — AB (ref 0.6–1.1)
LVOT diameter: 22 mm
MV pk A vel: 91.7 m/s
MVPG: 3 mmHg
MVPKEVEL: 81.5 m/s
Weight: 1788.37 oz

## 2017-11-28 LAB — FERRITIN: FERRITIN: 984 ng/mL — AB (ref 24–336)

## 2017-11-28 LAB — BASIC METABOLIC PANEL
Anion gap: 5 (ref 5–15)
BUN: 15 mg/dL (ref 6–20)
CALCIUM: 7.8 mg/dL — AB (ref 8.9–10.3)
CHLORIDE: 99 mmol/L — AB (ref 101–111)
CO2: 26 mmol/L (ref 22–32)
CREATININE: 0.79 mg/dL (ref 0.61–1.24)
Glucose, Bld: 101 mg/dL — ABNORMAL HIGH (ref 65–99)
Potassium: 3.4 mmol/L — ABNORMAL LOW (ref 3.5–5.1)
Sodium: 130 mmol/L — ABNORMAL LOW (ref 135–145)

## 2017-11-28 LAB — RETICULOCYTES
RBC.: 3.53 MIL/uL — ABNORMAL LOW (ref 4.22–5.81)
Retic Count, Absolute: 24.7 10*3/uL (ref 19.0–186.0)
Retic Ct Pct: 0.7 % (ref 0.4–3.1)

## 2017-11-28 LAB — CRYPTOCOCCAL ANTIGEN
CRYPTO AG: POSITIVE — AB
CRYPTOCOCCAL AG TITER: 2560 — AB

## 2017-11-28 LAB — FOLATE: FOLATE: 9 ng/mL (ref >5.9)

## 2017-11-28 LAB — VITAMIN B12: Vitamin B-12: 781 pg/mL (ref 180–914)

## 2017-11-28 LAB — MAGNESIUM: Magnesium: 1.9 mg/dL (ref 1.7–2.4)

## 2017-11-28 MED ORDER — PERFLUTREN LIPID MICROSPHERE
1.0000 mL | INTRAVENOUS | Status: AC | PRN
  Administered 2017-11-28: 2 mL via INTRAVENOUS
  Filled 2017-11-28: qty 10

## 2017-11-28 MED ORDER — MAGNESIUM SULFATE IN D5W 1-5 GM/100ML-% IV SOLN
1.0000 g | Freq: Once | INTRAVENOUS | Status: AC
  Administered 2017-11-28: 1 g via INTRAVENOUS
  Filled 2017-11-28: qty 100

## 2017-11-28 MED ORDER — POTASSIUM CHLORIDE CRYS ER 20 MEQ PO TBCR: 40.0000 meq | EXTENDED_RELEASE_TABLET | Freq: Once | ORAL | Status: DC

## 2017-11-28 MED ORDER — SODIUM CHLORIDE 0.9 % IV SOLN: INTRAVENOUS | Status: DC

## 2017-11-28 MED ORDER — POTASSIUM CHLORIDE CRYS ER 20 MEQ PO TBCR
40.0000 meq | EXTENDED_RELEASE_TABLET | Freq: Once | ORAL | Status: AC
  Administered 2017-11-28: 40 meq via ORAL
  Filled 2017-11-28: qty 2

## 2017-11-28 NOTE — Progress Notes (Signed)
  Echocardiogram 2D Echocardiogram has been performed.  Kenneth Rivas 11/28/2017, 1:59 PM

## 2017-11-28 NOTE — Progress Notes (Signed)
Patient ID: Kenneth Rivas, male   DOB: 1961/04/30, 57 y.o.   MRN: [id number]          [facility]  Date of Admission:  11/26/2017           Day 2 amphotericin        Day 2 flucytosine ASSESSMENT: He has persistent cryptococcal meningitis complicating advanced, uncontrolled HIV infection.  His opening pressure was elevated at 36 cm of water.  It appears that his mental status is better than on admission.  Yeast were seen on CSF stain.  I will continue amphotericin and flucytosine.  PLAN: 1. Continue current antifungal therapy  Principal Problem:   Cryptococcal meningoencephalitis (HCC) Active Problems:   AIDS (acquired immune deficiency syndrome) (HCC)   HTN (hypertension)   Cachexia associated with AIDS (HCC)   Hypokalemia   Hyponatremia   Protein-calorie malnutrition, severe   Prolonged QT interval   Near syncope   Cryptococcal meningitis (HCC)   Anemia   Scheduled Meds: . dextrose  10 mL Intravenous Q24H  . dextrose  10 mL Intravenous Q24H  . docusate  25 mg Both EARS BID  . feeding supplement (ENSURE ENLIVE)  237 mL Oral TID BM  . flucytosine  25 mg/kg Oral Q6H  . multivitamin with minerals  1 tablet Oral Daily  . potassium chloride  40 mEq Oral Once  . sodium chloride  500 mL Intravenous Q24H  . sodium chloride  500 mL Intravenous Q24H  . sodium chloride flush  3 mL Intravenous Q12H  . sulfamethoxazole-trimethoprim  1 tablet Oral Daily   Continuous Infusions: . sodium chloride    . amphotericin  B  Liposome (AMBISOME) ADULT IV Stopped (11/27/17 2002)  . magnesium sulfate 1 - 4 g bolus IVPB     PRN Meds:.acetaminophen **OR** acetaminophen, diphenhydrAMINE **OR** diphenhydrAMINE, hydrALAZINE, meperidine (DEMEROL) injection   SUBJECTIVE: He states that he is feeling better.  He denies headache.  He is able to tell me why he was brought to the hospital.  He thought he was at [facility].  He tells me that he ate a full  breakfast.  Review of Systems: Review of Systems  Constitutional: Negative for chills, diaphoresis and fever.  Gastrointestinal: Negative for abdominal pain, diarrhea, nausea and vomiting.  Neurological: Negative for headaches.    No Known Allergies  OBJECTIVE: Vitals:   11/27/17 1400 11/27/17 2215 11/28/17 0400 11/28/17 0500  BP: (!) 140/96 (!) 149/108 (!) 148/100   Pulse: 100 92 91   Resp: 16 18 18    Temp: 98.7 F (37.1 C) 97.9 F (36.6 C) 98 F (36.7 C)   TempSrc: Oral Oral Oral   SpO2: 99% 100% 95%   Weight:    111 lb 12.4 oz (50.7 kg)  Height:       Body mass index is 18.04 kg/m.  Physical Exam  Constitutional:  He was sleeping when I entered the room but woke easily and was quite cheerful and talkative.  He is cachectic.  Neck: Neck supple.  Cardiovascular: Normal rate and regular rhythm.  No murmur heard. Pulmonary/Chest: Effort normal. He has no wheezes. He has no rales.  Abdominal: Soft. He exhibits no distension. There is no tenderness.  Neurological: He is alert.  Skin: No rash noted.  Psychiatric: Mood and affect normal.    Lab Results Lab Results  Component Value Date   WBC 3.3 (L) 11/28/2017   HGB 9.5 (L) 11/28/2017   HCT 29.2 (L) 11/28/2017  MCV 83.2 11/28/2017   PLT 73 (L) 11/28/2017    Lab Results  Component Value Date   CREATININE 0.79 11/28/2017   BUN 15 11/28/2017   NA 130 (L) 11/28/2017   K 3.4 (L) 11/28/2017   CL 99 (L) 11/28/2017   CO2 26 11/28/2017    Lab Results  Component Value Date   ALT 78 (H) 11/26/2017   AST 38 11/26/2017   ALKPHOS 133 (H) 11/26/2017   BILITOT 1.0 11/26/2017     Microbiology: Recent Results (from the past 240 hour(s))  CSF culture     Status: None (Preliminary result)   Collection Time: 11/27/17  3:33 PM  Result Value Ref Range Status   Specimen Description CSF  Final   Special Requests NONE  Final   Gram Stain   Final    WBC PRESENT, PREDOMINANTLY MONONUCLEAR YEAST PRESENT CYTOSPIN  SMEAR CRITICAL RESULT CALLED TO, READ BACK BY AND VERIFIED WITH: R.CORCORAN AT 1752 ON 11/27/17 BY N.THOMPSON    Culture   Final    TOO YOUNG TO READ Performed at [facility], [address] [place], [place] [address]    Report Status PENDING  Incomplete  Culture, fungus without smear     Status: None (Preliminary result)   Collection Time: 11/27/17  3:33 PM  Result Value Ref Range Status   Specimen Description CSF  Final   Special Requests PENDING  Incomplete   Culture   Final    NO GROWTH < 24 HOURS Performed at [facility], [address] [place], [place] [address]    Report Status PENDING  Incomplete    Cliffton AstersJohn Jamisen Duerson, MD [facility] [phone] pager   [phone] 11/28/2017, 11:05 AM

## 2017-11-28 NOTE — Progress Notes (Signed)
PROGRESS NOTE    Kenneth Rivas  MVH:[id number] DOB: 1961/08/10 DOA: 11/26/2017 PCP: Randall HissVan Dam, Cornelius N, MD    Brief Narrative:  Patient is a 57 year old gentleman history of AIDS last CD4 count less than 10, viral load of 155,000 October 2018, history of cryptococcal meningitis in the fall 2018 have been off and on antifungal medication.  Patient has been lost to follow-up in the ID clinic.  Vision presented to the ED with generalized weakness and per EMS report patient noted to have a near syncopal event.  No loss of consciousness however with complaints of weakness and dizziness on standing.  CT head which was done was negative.  Patient also noted to have QTC prolongation.  Patient admitted for further evaluation and management.  Patient for lumbar puncture per interventional radiology 11/27/2017.   Assessment & Plan:   Principal Problem:   Cryptococcal meningoencephalitis (HCC) Active Problems:   HTN (hypertension)   Cachexia associated with AIDS (HCC)   Hypokalemia   Hyponatremia   Protein-calorie malnutrition, severe   AIDS (acquired immune deficiency syndrome) (HCC)   Prolonged QT interval   Near syncope   Cryptococcal meningitis (HCC)   Anemia   1  Persistent cryptococcal meningitis. Patient noted to have a history of cryptococcal meningitis in the fall 2018 and patient noted to be off and on antifungal medication.  Patient last seen in ID clinic in October 2018.  At that time patient was to be started on fluconazole 800 mg daily however unknown as to whether patient actually completed his course of treatment.  Patient underwent lumbar puncture 11/27/2017 with elevated opening pressure of 36 cm of water and yeast noted on CSF stain.  ID has consulted on the patient and patient has been started on flucytosine as well as amphotericin B.  ID following and appreciate their input and recommendations.  2 near syncope Was admitted with a near syncopal episode and noted per admitting  physician to have some prolonged QT interval.  QT prolongation has resolved.  May have also been secondary to persistent cryptococcal meningitis.  Patient on IV fluids.  Patient noted to have a prior history of meningitis due to cryptococcus species with medical noncompliance and concern for partially treated infection.  Patient also noted to be noncompliant with medications.  Electrolytes have been repleted.  2D echo which was obtained with poor acoustic windows and unable to assess LV function or wall motion.  We will repeat 2D echo with Definity.  Continue antifungal agents.  IV fluids.  Supportive care.  3.  AIDS Patient noted to be HIV positive.  Patient has not been started on any ART therapy.  Concern about medical compliance.  Last viral load from 09/09/2017 was 155,000.  CD4 count and viral load pending. Continue Bactrim for prophylaxis.  ID consulted and are following.  4.  Prolonged QT interval Replete electrolytes.  Repeat EKG with resolution of QT prolongation.  Repeat 2D echo with Definity.  Follow.  5.  Hypokalemia/hyponatremia Replete.  Continue IV fluids.  Follow.  6.  Severe protein calorie malnutrition Continue Ensure supplementation.  Consult with dietitian.  7.  Hypertension Stable.  8.  Cerumen impaction Patient noted to have decreased hearing.  Irrigation.  9.  Dehydration IV fluids.  10.  Anxiety No suicidal or homicidal ideation.  Follow.  11.  Chronic anemia Stable.  Anemia panel consistent with anemia of chronic disease.  Globin currently at 9.5 from 12.4 likely dilutional effect.  No overt bleeding.  Follow H&H.  DVT prophylaxis: SCDs Code Status: Full Family Communication: No family at bedside. Disposition Plan: To be determined.   Consultants:   Infectious diseases: Dr. Drue Second 11/27/2017  Procedures:   Lumbar puncture per interventional radiology 11/27/2017  2D echo 11/27/2017  CT head without contrast 11/26/2017  Chest x-ray  11/26/2017    Antimicrobials:  IV amphotericin 11/27/2017  Flucytosine 11/27/2017   Subjective: Patient sleeping easily arousable.  Denies chest pain.  Denies shortness of breath.  States he is tolerating current diet.    Objective: Vitals:   11/27/17 1400 11/27/17 2215 11/28/17 0400 11/28/17 0500  BP: (!) 140/96 (!) 149/108 (!) 148/100   Pulse: 100 92 91   Resp: 16 18 18    Temp: 98.7 F (37.1 C) 97.9 F (36.6 C) 98 F (36.7 C)   TempSrc: Oral Oral Oral   SpO2: 99% 100% 95%   Weight:    50.7 kg (111 lb 12.4 oz)  Height:        Intake/Output Summary (Last 24 hours) at 11/28/2017 1132 Last data filed at 11/28/2017 0400 Gross per 24 hour  Intake 3673.33 ml  Output 500 ml  Net 3173.33 ml   Filed Weights   11/27/17 0517 11/28/17 0500  Weight: 47 kg (103 lb 9.9 oz) 50.7 kg (111 lb 12.4 oz)    Examination:  General exam: Sleeping easily arousable.Arman Bogus.  Cachectic.  Frail.  Chronically ill appearing.  Temporal wasting. Respiratory system: Clear to auscultation.  No wheezes, no crackles, no rhonchi.   Cardiovascular system: Regular rate and rhythm no murmurs rubs or gallops.  No JVD.  No lower extremity edema.  Gastrointestinal system: Abdomen is soft, nontender, nondistended, scaphoid.  No hepatosplenomegaly.  Positive bowel sounds.  Central nervous system: Sleeping however easily arousable and following commands.  Moving extremities spontaneously.  Extremities: Symmetric 5 x 5 power. Skin: No rashes, lesions or ulcers Psychiatry: Judgement and insight appear poor-fair. Mood & affect appropriate.     Data Reviewed: I have personally reviewed following labs and imaging studies  CBC: Recent Labs  Lab 11/26/17 1630 11/26/17 1653 11/27/17 0443 11/28/17 0429  WBC 2.6*  --  3.1* 3.3*  NEUTROABS 2.0  --   --  2.6  HGB 13.6 15.0 12.4* 9.5*  HCT 40.0 44.0 37.1* 29.2*  MCV 82.3  --  83.2 83.2  PLT 100*  --  110* 73*   Basic Metabolic Panel: Recent Labs  Lab  11/26/17 1630 11/26/17 1653 11/27/17 0443 11/27/17 0853 11/28/17 0429  NA 130* 134* 131*  --  130*  K 3.1* 3.2* 3.5  --  3.4*  CL 89* 88* 94*  --  99*  CO2 28  --  27  --  26  GLUCOSE 109* 110* 104*  --  101*  BUN 12 11 14   --  15  CREATININE 0.79 0.70 0.73  --  0.79  CALCIUM 8.8*  --  8.8*  --  7.8*  MG 1.7  --   --  2.2 1.9  PHOS 3.9  --   --   --   --    GFR: Estimated Creatinine Clearance: 72.2 mL/min (by C-G formula based on SCr of 0.79 mg/dL). Liver Function Tests: Recent Labs  Lab 11/26/17 1630  AST 38  ALT 78*  ALKPHOS 133*  BILITOT 1.0  PROT 8.6*  ALBUMIN 3.4*   No results for input(s): LIPASE, AMYLASE in the last 168 hours. No results for input(s): AMMONIA in the last 168 hours. Coagulation Profile: Recent  Labs  Lab 11/26/17 1700  INR 0.95   Cardiac Enzymes: Recent Labs  Lab 11/27/17 0853 11/27/17 1608 11/27/17 2205  TROPONINI 0.03* <0.03 <0.03   BNP (last 3 results) No results for input(s): PROBNP in the last 8760 hours. HbA1C: No results for input(s): HGBA1C in the last 72 hours. CBG: No results for input(s): GLUCAP in the last 168 hours. Lipid Profile: No results for input(s): CHOL, HDL, LDLCALC, TRIG, CHOLHDL, LDLDIRECT in the last 72 hours. Thyroid Function Tests: No results for input(s): TSH, T4TOTAL, FREET4, T3FREE, THYROIDAB in the last 72 hours. Anemia Panel: Recent Labs    11/28/17 0824  RETICCTPCT 0.7   Sepsis Labs: No results for input(s): PROCALCITON, LATICACIDVEN in the last 168 hours.  Recent Results (from the past 240 hour(s))  CSF culture     Status: None (Preliminary result)   Collection Time: 11/27/17  3:33 PM  Result Value Ref Range Status   Specimen Description CSF  Final   Special Requests NONE  Final   Gram Stain   Final    WBC PRESENT, PREDOMINANTLY MONONUCLEAR YEAST PRESENT CYTOSPIN SMEAR CRITICAL RESULT CALLED TO, READ BACK BY AND VERIFIED WITH: R.CORCORAN AT 1752 ON 11/27/17 BY N.Elynor Kallenberger    Culture    Final    TOO YOUNG TO READ Performed at [facility], [address], [place], [place] [address]    Report Status PENDING  Incomplete  Culture, fungus without smear     Status: None (Preliminary result)   Collection Time: 11/27/17  3:33 PM  Result Value Ref Range Status   Specimen Description CSF  Final   Special Requests PENDING  Incomplete   Culture   Final    NO GROWTH < 24 HOURS Performed at [facility], [address] [place], [place] [address]    Report Status PENDING  Incomplete         Radiology Studies: Dg Chest 2 View  Result Date: 11/26/2017 CLINICAL DATA:  Weakness EXAM: CHEST  2 VIEW COMPARISON:  07/11/2017 chest radiograph. FINDINGS: Stable cardiomediastinal silhouette with normal heart size. No pneumothorax. No pleural effusion. Lungs appear clear, with no acute consolidative airspace disease and no pulmonary edema. IMPRESSION: No active cardiopulmonary disease. Electronically Signed   By: Delbert Phenix M.D.   On: 11/26/2017 17:27   Ct Head Wo Contrast  Result Date: 11/26/2017 CLINICAL DATA:  Syncopal episodes, generalized weakness, focal neural deficit of greater than 6 hours, history of HIV, cryptococcal meningitis, hypertension EXAM: CT HEAD WITHOUT CONTRAST TECHNIQUE: Contiguous axial images were obtained from the base of the skull through the vertex without intravenous contrast. Sagittal and coronal MPR images reconstructed from axial data set. COMPARISON:  06/24/2017 FINDINGS: Brain: Exam degraded by patient motion. Generalized atrophy. Normal ventricular morphology. No midline shift or mass effect. Scattered areas of white matter hypoattenuation particularly frontal regions. No intracranial hemorrhage, mass lesion, or evidence acute infarction. No extra-axial fluid collections. Vascular: Unremarkable Skull: Unremarkable Sinuses/Orbits: Clear Other: N/A IMPRESSION: White matter hypoattenuation in the frontal regions greater on RIGHT similar prior exam. No  new intracranial abnormalities. Electronically Signed   By: Ulyses Southward M.D.   On: 11/26/2017 17:05   Dg Fluoro Guide Lumbar Puncture  Result Date: 11/27/2017 CLINICAL DATA:  AIDS.  Evaluate for cryptococcal meningitis. EXAM: DIAGNOSTIC LUMBAR PUNCTURE UNDER FLUOROSCOPIC GUIDANCE FLUOROSCOPY TIME:  Fluoroscopy Time:  1 minute 14 seconds Radiation Exposure Index (if provided by the fluoroscopic device): 10 mGy Number of Acquired Spot Images: 1 PROCEDURE:  Informed consent was obtained from the patient prior to the procedure, including potential complications of headache, allergy, and pain. With the patient prone, the lower back was prepped with Betadine. 1% Lidocaine was used for local anesthesia. Lumbar puncture was performed at the L2-3 level using a 20 gauge needle with return of clear CSF with an opening pressure of 36 cm water. 9.5 ml of CSF were obtained for laboratory studies. The patient tolerated the procedure well and there were no apparent complications. IMPRESSION: 1. Successful lumbar puncture at L2-3. 2. Opening pressure of 36 cm water.  9.5 cc of CSF collected. Electronically Signed   By: Signa Kell M.D.   On: 11/27/2017 15:43        Scheduled Meds: . dextrose  10 mL Intravenous Q24H  . dextrose  10 mL Intravenous Q24H  . docusate  25 mg Both EARS BID  . feeding supplement (ENSURE ENLIVE)  237 mL Oral TID BM  . flucytosine  25 mg/kg Oral Q6H  . multivitamin with minerals  1 tablet Oral Daily  . potassium chloride  40 mEq Oral Once  . sodium chloride  500 mL Intravenous Q24H  . sodium chloride  500 mL Intravenous Q24H  . sodium chloride flush  3 mL Intravenous Q12H  . sulfamethoxazole-trimethoprim  1 tablet Oral Daily   Continuous Infusions: . sodium chloride    . amphotericin  B  Liposome (AMBISOME) ADULT IV Stopped (11/27/17 2002)  . magnesium sulfate 1 - 4 g bolus IVPB       LOS: 1 day    Time spent: 35 minutes    Ramiro Harvest, MD [facility] Pager  [phone]  If 7PM-7AM, please contact night-coverage [email or web address] Password [facility] 11/28/2017, 11:32 AM

## 2017-11-28 NOTE — Progress Notes (Signed)
PHARMACY BRIEF NOTE:   ELECTROLYTE REPLACEMENT PER LIPSOMAL AMPHOTERICIN B PROTOCOL  Ambisome 240 mg (5 mg/kg) IV q24h in combination with flucytosine 1250 mg (25 mg/kg) PO q6h  for cryptococcal meningitis.   ID service was consulted and is actively following.  Recent Labs    11/26/17 1630  11/27/17 0443 11/27/17 0853 11/28/17 0429  NA 130*   < > 131*  --  130*  K 3.1*   < > 3.5  --  3.4*  CL 89*   < > 94*  --  99*  CO2 28  --  27  --  26  GLUCOSE 109*   < > 104*  --  101*  BUN 12   < > 14  --  15  CREATININE 0.79   < > 0.73  --  0.79  CALCIUM 8.8*  --  8.8*  --  7.8*  PHOS 3.9  --   --   --   --   MG 1.7  --   --  2.2 1.9  ALBUMIN 3.4*  --   --   --   --   ALKPHOS 133*  --   --   --   --   AST 38  --   --   --   --   ALT 78*  --   --   --   --   BILITOT 1.0  --   --   --   --    < > = values in this interval not displayed.    Assessment: K low, falling Mg borderline, falling Na slightly low, down a bit from yesterday  Plan, per Amphotericin B Protocol: KDur 40 mEq PO x 1 this AM Mag Sulfate 1 gram IV x 1 this AM Continue NS 500 mL bolus IV before and after each infusion of Ambisome Continue daily BMet, Mg  Elie Goodyandy Merlie Noga, PharmD, BCPS Pager: [phone] 11/28/2017  6:28 AM

## 2017-11-28 NOTE — Progress Notes (Signed)
OT Cancellation Note  Patient Details Name: Kenneth Rivas MRN: [id number] DOB: 24-Mar-1961   Cancelled Treatment:    Reason Eval/Treat Not Completed: Patient at procedure or test/ unavailable. wlll check back as soon as possible.  Kenneth Rivas 11/28/2017, 12:56 PM  Kenneth Rivas, OTR/L [phone] 11/28/2017

## 2017-11-28 NOTE — Evaluation (Signed)
Occupational Therapy Evaluation Patient Details Name: Kenneth Rivas MRN: [id number] DOB: [birth year] Today's Date: 11/28/2017    History of Present Illness 57 yo male admitted with weakness, near syncopal episode.  Dx:  cryptococcal meningoencephalitis.  Hx of HIV/AIDS, Hep B, drug abuse, noncompliance, cachexia, anemia.    Clinical Impression   Pt was admitted for the above. He was agreeable to sitting EOB for about 5 minutes.  He did not want to attempt to scoot up towards Kenneth Rivas nor stand this session.  Unsure of PLOF. Will follow in acute setting to increase strength and endurance to maximize participation in adls/toilet transfers.    Follow Up Recommendations  SNF;Supervision/Assistance - 24 hour    Equipment Recommendations  (to be further assessed)    Recommendations for Other Services       Precautions / Restrictions Precautions Precautions: Fall Restrictions Weight Bearing Restrictions: No      Mobility Bed Mobility         Supine to sit: Min assist;HOB elevated Sit to supine: Min guard      Transfers                 General transfer comment: pt not agreeable to standing nor scooting up HOB    Balance   Sitting-balance support: Bilateral upper extremity supported;Feet supported Sitting balance-Leahy Scale: Fair                                     ADL either performed or assessed with clinical judgement   ADL Overall ADL's : Needs assistance/impaired Eating/Feeding: Minimal assistance;Bed level   Grooming: Minimal assistance;Bed level   Upper Body Bathing: Maximal assistance;Bed level   Lower Body Bathing: Total assistance;Bed level   Upper Body Dressing : Maximal assistance;Bed level   Lower Body Dressing: Total assistance;Bed level                 General ADL Comments: pt only agreeable to sitting EOB. Drank water.  Eyes are dysconjugate. Pt was overshooting target     Vision   Additional Comments: dysconjugate  gaze:  overshoots when reaching for cup     Perception     Praxis      Pertinent Vitals/Pain Pain Assessment: No/denies pain     Hand Dominance     Extremity/Trunk Assessment Upper Extremity Assessment Upper Extremity Assessment: Generalized weakness           Communication Communication Communication: HOH   Cognition Arousal/Alertness: Awake/alert Behavior During Therapy: WFL for tasks assessed/performed Overall Cognitive Status: No family/caregiver present to determine baseline cognitive functioning                                     General Comments       Exercises     Shoulder Instructions      Home Living Family/patient expects to be discharged to:: Unsure                                        Prior Functioning/Environment          Comments: unsure of PLOF. difficult to get PLOF from pt        OT Problem List: Decreased strength;Decreased activity tolerance;Impaired balance (sitting and/or standing);Impaired vision/perception;Decreased knowledge  of use of DME or AE      OT Treatment/Interventions: Self-care/ADL training;DME and/or AE instruction;Patient/family education;Balance training;Visual/perceptual remediation/compensation;Therapeutic activities    OT Goals(Current goals can be found in the care plan section) Acute Rehab OT Goals Patient Stated Goal: none stated OT Goal Formulation: With patient(in general terms) Time For Goal Achievement: 12/12/17 Potential to Achieve Goals: Fair ADL Goals Pt Will Perform Eating: with supervision;bed level;sitting Pt Will Perform Grooming: with set-up;sitting;bed level Pt Will Transfer to Toilet: with mod assist;with +2 assist;bedside commode;stand pivot transfer Additional ADL Goal #1: pt will perform bil UE AROM exercises, 3 sets 10 with rest breaks to increase strength and endurance for adls  OT Frequency: Min 2X/week   Barriers to D/C:            Co-evaluation               AM-PAC PT "6 Clicks" Daily Activity     Outcome Measure Help from another person eating meals?: A Little Help from another person taking care of personal grooming?: A Little Help from another person toileting, which includes using toliet, bedpan, or urinal?: A Lot Help from another person bathing (including washing, rinsing, drying)?: A Lot Help from another person to put on and taking off regular upper body clothing?: A Lot Help from another person to put on and taking off regular lower body clothing?: Total 6 Click Score: 13   End of Session    Activity Tolerance: Patient limited by fatigue Patient left: in bed;with call bell/phone within reach;with bed alarm set  OT Visit Diagnosis: Muscle weakness (generalized) (M62.81)                Time: [id number] OT Time Calculation (min): 15 min Charges:  OT General Charges $OT Visit: 1 Visit OT Evaluation $OT Eval Low Complexity: 1 Low G-Codes:     [place] Lat[place] Rivas, OTR/L [phone] 11/28/2017  Kenneth Rivas 11/28/2017, 3:21 PM

## 2017-11-29 DIAGNOSIS — R931 Abnormal findings on diagnostic imaging of heart and coronary circulation: Secondary | ICD-10-CM | POA: Diagnosis present

## 2017-11-29 DIAGNOSIS — I509 Heart failure, unspecified: Secondary | ICD-10-CM

## 2017-11-29 DIAGNOSIS — R531 Weakness: Secondary | ICD-10-CM

## 2017-11-29 DIAGNOSIS — I429 Cardiomyopathy, unspecified: Secondary | ICD-10-CM

## 2017-11-29 LAB — MAGNESIUM: MAGNESIUM: 1.7 mg/dL (ref 1.7–2.4)

## 2017-11-29 LAB — CBC WITH DIFFERENTIAL/PLATELET
Basophils Absolute: 0 10*3/uL (ref 0.0–0.1)
Basophils Relative: 0 %
EOS ABS: 0.1 10*3/uL (ref 0.0–0.7)
EOS PCT: 5 %
HCT: 29.3 % — ABNORMAL LOW (ref 39.0–52.0)
HEMOGLOBIN: 9.8 g/dL — AB (ref 13.0–17.0)
LYMPHS ABS: 0.4 10*3/uL — AB (ref 0.7–4.0)
Lymphocytes Relative: 16 %
MCH: 27.6 pg (ref 26.0–34.0)
MCHC: 33.4 g/dL (ref 30.0–36.0)
MCV: 82.5 fL (ref 78.0–100.0)
Monocytes Absolute: 0.2 10*3/uL (ref 0.1–1.0)
Monocytes Relative: 7 %
NEUTROS PCT: 72 %
Neutro Abs: 1.7 10*3/uL (ref 1.7–7.7)
PLATELETS: 76 10*3/uL — AB (ref 150–400)
RBC: 3.55 MIL/uL — AB (ref 4.22–5.81)
RDW: 13.6 % (ref 11.5–15.5)
WBC: 2.3 10*3/uL — AB (ref 4.0–10.5)

## 2017-11-29 LAB — BASIC METABOLIC PANEL
ANION GAP: 5 (ref 5–15)
BUN: 11 mg/dL (ref 6–20)
CALCIUM: 8 mg/dL — AB (ref 8.9–10.3)
CO2: 25 mmol/L (ref 22–32)
CREATININE: 0.68 mg/dL (ref 0.61–1.24)
Chloride: 101 mmol/L (ref 101–111)
Glucose, Bld: 97 mg/dL (ref 65–99)
Potassium: 3.5 mmol/L (ref 3.5–5.1)
Sodium: 131 mmol/L — ABNORMAL LOW (ref 135–145)

## 2017-11-29 LAB — TSH: TSH: 1.209 u[IU]/mL (ref 0.350–4.500)

## 2017-11-29 LAB — OCCULT BLOOD X 1 CARD TO LAB, STOOL: Fecal Occult Bld: NEGATIVE

## 2017-11-29 MED ORDER — POTASSIUM CHLORIDE CRYS ER 20 MEQ PO TBCR
40.0000 meq | EXTENDED_RELEASE_TABLET | Freq: Once | ORAL | Status: AC
  Administered 2017-11-29: 40 meq via ORAL
  Filled 2017-11-29: qty 2

## 2017-11-29 MED ORDER — LISINOPRIL 5 MG PO TABS
2.5000 mg | ORAL_TABLET | Freq: Every day | ORAL | Status: DC
  Administered 2017-11-29: 2.5 mg via ORAL
  Filled 2017-11-29: qty 1

## 2017-11-29 MED ORDER — LISINOPRIL 5 MG PO TABS
5.0000 mg | ORAL_TABLET | Freq: Every day | ORAL | Status: DC
Start: 2017-11-30 — End: 2017-12-06
  Administered 2017-11-30 – 2017-12-06 (×7): 5 mg via ORAL
  Filled 2017-11-29 (×7): qty 1

## 2017-11-29 MED ORDER — CARVEDILOL 3.125 MG PO TABS
3.1250 mg | ORAL_TABLET | Freq: Two times a day (BID) | ORAL | Status: DC
Start: 2017-11-29 — End: 2017-12-24
  Administered 2017-11-29 – 2017-12-24 (×49): 3.125 mg via ORAL
  Filled 2017-11-29 (×48): qty 1

## 2017-11-29 MED ORDER — MAGNESIUM SULFATE 2 GM/50ML IV SOLN
2.0000 g | Freq: Once | INTRAVENOUS | Status: DC
  Filled 2017-11-29: qty 50

## 2017-11-29 MED ORDER — MAGNESIUM SULFATE 4 GM/100ML IV SOLN
4.0000 g | Freq: Once | INTRAVENOUS | Status: AC
  Administered 2017-11-29: 4 g via INTRAVENOUS
  Filled 2017-11-29: qty 100

## 2017-11-29 MED ORDER — POTASSIUM CHLORIDE CRYS ER 20 MEQ PO TBCR: 20.0000 meq | EXTENDED_RELEASE_TABLET | Freq: Two times a day (BID) | ORAL | Status: DC

## 2017-11-29 NOTE — Plan of Care (Signed)
Cont to mon 

## 2017-11-29 NOTE — Progress Notes (Signed)
PHARMACY BRIEF NOTE:   ELECTROLYTE REPLACEMENT PER LIPSOMAL AMPHOTERICIN B PROTOCOL  Ambisome 240 mg (5 mg/kg) IV q24h in combination with flucytosine 1250 mg (25 mg/kg) PO q6h  for cryptococcal meningitis.   ID service was consulted and is actively following.  Recent Labs    11/26/17 1630  11/28/17 0429 11/29/17 0528  NA 130*   < > 130* 131*  K 3.1*   < > 3.4* 3.5  CL 89*   < > 99* 101  CO2 28   < > 26 25  GLUCOSE 109*   < > 101* 97  BUN 12   < > 15 11  CREATININE 0.79   < > 0.79 0.68  CALCIUM 8.8*   < > 7.8* 8.0*  PHOS 3.9  --   --   --   MG 1.7   < > 1.9 1.7  ALBUMIN 3.4*  --   --   --   ALKPHOS 133*  --   --   --   AST 38  --   --   --   ALT 78*  --   --   --   BILITOT 1.0  --   --   --    < > = values in this interval not displayed.    Assessment: K at LLN, slightly improved; received KCl 40 mEq PO x 1 yesterday Mg at LLN, falling despite Mag Sulfate 1 gram IV x 1 yesterday   Plan, per Amphotericin B Protocol: KDur 20mEq PO BID today only (2 doses total today) Mag Sulfate 2 grams IV x 1 this AM Continue NS 500 mL bolus IV before and after each infusion of Ambisome Continue daily BMet, Mg  Kenneth Rivas, PharmD, BCPS Pager: [phone] 11/29/2017  7:05 AM

## 2017-11-29 NOTE — Progress Notes (Signed)
Nutrition Follow-up  DOCUMENTATION CODES:   Underweight, Severe malnutrition in context of chronic illness  INTERVENTION:   Continue Ensure Enlive po TID, each supplement provides 350 kcal and 20 grams of protein Continue Multivitamin with minerals daily  RD will continue to monitor  NUTRITION DIAGNOSIS:   Severe Malnutrition related to chronic illness(HIV with non compliance) as evidenced by percent weight loss, severe fat depletion, moderate fat depletion, severe muscle depletion.  Ongoing.  GOAL:   Patient will meet greater than or equal to 90% of their needs  Not meeting.  MONITOR:   PO intake, Supplement acceptance, Weight trends, Labs  REASON FOR ASSESSMENT:   Consult Assessment of nutrition requirement/status  ASSESSMENT:   Pt with PMH significant for HIV/AIDS with poor compliance, drug abuse, HTN, and anemia. Presents this admission with near syncopal event. Admitted for prolonged QT interval and dehydration.   Initial assessment completed 1/11. Pt has been drinking Ensure supplements. Last recorded meal was 50% completion of dinner on 1/11. Pt is receiving house meals.  Weight is now +8 lb since admission.   Medications: Colace capsule BID, Multivitamin with minerals daily   Diet Order:  Diet regular Room service appropriate? Yes; Fluid consistency: Thin  EDUCATION NEEDS:   Not appropriate for education at this time  Skin:  Skin Assessment: Reviewed RN Assessment  Last BM:  PTA  Height:   Ht Readings from Last 1 Encounters:  11/27/17 5\' 6"  (1.676 m)    Weight:   Wt Readings from Last 1 Encounters:  11/28/17 111 lb 12.4 oz (50.7 kg)    Ideal Body Weight:  70 kg  BMI:  Body mass index is 18.04 kg/m.  Estimated Nutritional Needs:   Kcal:  2100-2300 kcal/day  Protein:  100-110 g/day  Fluid:  >2.1 L/day  Tilda FrancoLindsey Cailee Blanke, MS, RD, LDN [facility] Clinical Dietitian Pager: [phone] After Hours Pager: [phone]

## 2017-11-29 NOTE — Progress Notes (Signed)
12 lead ekg complete and placed in chart

## 2017-11-29 NOTE — Progress Notes (Signed)
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  Patient has been lost to follow-up in the ID clinic.  Vision presented to the ED with generalized weakness and per EMS report patient noted to have a near syncopal event.  No loss of consciousness however with complaints of weakness and dizziness on standing.  CT head which was done was negative.  Patient also noted to have QTC prolongation.  Patient admitted for further evaluation and management.  Patient for lumbar puncture per interventional radiology 11/27/2017.   Assessment & Plan:   Principal Problem:   Cryptococcal meningoencephalitis (HCC) Active Problems:   HTN (hypertension)   Cachexia associated with AIDS (HCC)   Hypokalemia   Hyponatremia   Protein-calorie malnutrition, severe   AIDS (acquired immune deficiency syndrome) (HCC)   Prolonged QT interval   Near syncope   Cryptococcal meningitis (HCC)   Anemia   Abnormal echocardiogram   Cardiomyopathy [facility]): Per 2 d echo 11/28/2017   1  Persistent cryptococcal meningitis. Patient noted to have a history of cryptococcal meningitis in the fall 2018 and patient noted to be off and on antifungal medication.  Patient last seen in ID clinic in October 2018.  At that time patient was to be started on fluconazole 800 mg daily however unknown as to whether patient actually completed his course of treatment.  Patient underwent lumbar puncture 11/27/2017 with elevated opening pressure of 36 cm of water and yeast noted on CSF stain.  ID has consulted on the patient and patient has been started on flucytosine as well as amphotericin B.  ID following and appreciate their input and recommendations.  2 near  syncope Was admitted with a near syncopal episode and noted per admitting physician to have some prolonged QT interval.  QT prolongation has resolved. Repeat  2D echo with a EF of 30-35% with diffuse hypokinesis.  May have also been secondary to persistent cryptococcal meningitis.  Patient on IV fluids.  Saline lock IV fluids.  Patient noted to have a prior history of meningitis due to cryptococcus species with medical noncompliance and concern for partially treated infection.  Patient also noted to be noncompliant with medications.  Electrolytes have been repleted. Continue antifungal agents.  IV fluids.  Supportive care.  3.  AIDS Patient noted to be HIV positive.  Patient has not been started on any ART therapy.  Concern about medical compliance.  Last viral load from 09/09/2017 was 155,000.  CD4 count and viral load pending. Continue Bactrim for prophylaxis.  ID consulted and are following.  4.  Prolonged QT interval/cardiomyopathy/abnormal 2D echo Replete electrolytes.  Repeat EKG with resolution of QT prolongation.  Repeat 2D echo with Definity with a  EF of 30-35%, diffuse hypokinesis, grade 1 diastolic dysfunction.  Due to abnormal 2D echo near syncopal episode will consult with cardiology for further evaluation and management.  Start patient on low-dose Coreg 3.125 mg twice daily and lisinopril 2.5 mg daily..  Follow.  5.  Hypokalemia/hyponatremia Replete.  Keep potassium greater than 4.  Keep magnesium greater than 2.  We will saline lock IV fluids.  Follow.   6.  Severe protein calorie malnutrition Continue Ensure supplementation.  Consult with dietitian.  7.  Hypertension Stable.  8.  Cerumen impaction Patient noted to have decreased hearing.  Irrigation.  9.  Dehydration Improved with hydration.  Saline lock IV fluids.    10.  Anxiety No suicidal or homicidal ideation.  Follow.  11.  Chronic anemia Stable.  Anemia panel consistent with anemia of chronic disease.  Hemoglobin  currently at 9.8 from 9.5 from 12.4 likely dilutional effect.  No overt bleeding.  Follow H&H.   DVT prophylaxis: SCDs Code Status: Full Family Communication: No family at bedside. Disposition Plan: To be determined.   Consultants:   Infectious diseases: Dr. Drue SecondSnider 11/27/2017  Cardiology pending  Procedures:   Lumbar puncture per interventional radiology 11/27/2017  2D echo 11/27/2017, 11/28/2017  CT head without contrast 11/26/2017  Chest x-ray 11/26/2017    Antimicrobials:  IV amphotericin 11/27/2017  Flucytosine 11/27/2017   Subjective: Patient up in bed.  Just ate his lunch.  Denies chest pain.  Denies shortness of breath.  Feeling better than on admission.    Objective: Vitals:   11/28/17 0500 11/28/17 1433 11/28/17 2237 11/29/17 0548  BP:  (!) 146/91 (!) 161/109 (!) 150/98  Pulse:  84 81 (!) 108  Resp:  18 17 17   Temp:  98.6 F (37 C) 98.2 F (36.8 C) 98.5 F (36.9 C)  TempSrc:  Oral Oral Oral  SpO2:  100% 99% 97%  Weight: 50.7 kg (111 lb 12.4 oz)     Height:        Intake/Output Summary (Last 24 hours) at 11/29/2017 1243 Last data filed at 11/29/2017 0500 Gross per 24 hour  Intake 11930.33 ml  Output 3440 ml  Net 8490.33 ml   Filed Weights   11/27/17 0517 11/28/17 0500  Weight: 47 kg (103 lb 9.9 oz) 50.7 kg (111 lb 12.4 oz)    Examination:  General exam: Sitting up.  Alert.  Cachectic.  Frail.  Temporal wasting.  Chronically ill appearing.   Respiratory system: Clear to auscultation bilaterally.  No wheezes, no crackles, no rhonchi.   Cardiovascular system: Regular rate and rhythm no murmurs rubs or gallops.  No JVD.  No lower extremity edema.  Gastrointestinal system: Abdomen is nontender, nondistended, scaphoid, soft.  No rebound.  No guarding.  Positive bowel sounds. No hepatosplenomegaly.  Central nervous system: Alert and following commands.  Moving extremities spontaneously.  Extremities: Symmetric 5 x 5 power. Skin: No rashes, lesions or  ulcers Psychiatry: Judgement and insight appear poor-fair. Mood & affect appropriate.     Data Reviewed: I have personally reviewed following labs and imaging studies  CBC: Recent Labs  Lab 11/26/17 1630 11/26/17 1653 11/27/17 0443 11/28/17 0429 11/29/17 0528  WBC 2.6*  --  3.1* 3.3* 2.3*  NEUTROABS 2.0  --   --  2.6 1.7  HGB 13.6 15.0 12.4* 9.5* 9.8*  HCT 40.0 44.0 37.1* 29.2* 29.3*  MCV 82.3  --  83.2 83.2 82.5  PLT 100*  --  110* 73* 76*   Basic Metabolic Panel: Recent Labs  Lab 11/26/17 1630 11/26/17 1653 11/27/17 0443 11/27/17 0853 11/28/17 0429 11/29/17 0528  NA 130* 134* 131*  --  130* 131*  K 3.1* 3.2* 3.5  --  3.4* 3.5  CL 89* 88* 94*  --  99* 101  CO2 28  --  27  --  26 25  GLUCOSE 109* 110* 104*  --  101* 97  BUN 12 11 14   --  15 11  CREATININE 0.79 0.70 0.73  --  0.79 0.68  CALCIUM 8.8*  --  8.8*  --  7.8* 8.0*  MG 1.7  --   --  2.2 1.9 1.7  PHOS 3.9  --   --   --   --   --    GFR: Estimated Creatinine Clearance: 72.2 mL/min (by C-G formula based on SCr of 0.68 mg/dL). Liver Function Tests: Recent Labs  Lab 11/26/17 1630  AST 38  ALT 78*  ALKPHOS 133*  BILITOT 1.0  PROT 8.6*  ALBUMIN 3.4*   No results for input(s): LIPASE, AMYLASE in the last 168 hours. No results for input(s): AMMONIA in the last 168 hours. Coagulation Profile: Recent Labs  Lab 11/26/17 1700  INR 0.95   Cardiac Enzymes: Recent Labs  Lab 11/27/17 0853 11/27/17 1608 11/27/17 2205  TROPONINI 0.03* <0.03 <0.03   BNP (last 3 results) No results for input(s): PROBNP in the last 8760 hours. HbA1C: No results for input(s): HGBA1C in the last 72 hours. CBG: No results for input(s): GLUCAP in the last 168 hours. Lipid Profile: No results for input(s): CHOL, HDL, LDLCALC, TRIG, CHOLHDL, LDLDIRECT in the last 72 hours. Thyroid Function Tests: No results for input(s): TSH, T4TOTAL, FREET4, T3FREE, THYROIDAB in the last 72 hours. Anemia Panel: Recent Labs     11/28/17 0824  VITAMINB12 781  FOLATE 9.0  FERRITIN 984*  TIBC 216*  IRON 52  RETICCTPCT 0.7   Sepsis Labs: No results for input(s): PROCALCITON, LATICACIDVEN in the last 168 hours.  Recent Results (from the past 240 hour(s))  CSF culture     Status: None (Preliminary result)   Collection Time: 11/27/17  3:33 PM  Result Value Ref Range Status   Specimen Description CSF  Final   Special Requests NONE  Final   Gram Stain   Final    WBC PRESENT, PREDOMINANTLY MONONUCLEAR YEAST PRESENT CYTOSPIN SMEAR CRITICAL RESULT CALLED TO, READ BACK BY AND VERIFIED WITH: R.CORCORAN AT 1752 ON 11/27/17 BY N.Rayvon Dakin    Culture   Final    ABUNDANT YEAST IDENTIFICATION TO FOLLOW Performed at [facility], [address] [place], [place] [address]    Report Status PENDING  Incomplete  Culture, fungus without smear     Status: None (Preliminary result)   Collection Time: 11/27/17  3:33 PM  Result Value Ref Range Status   Specimen Description CSF  Final   Special Requests PENDING  Incomplete   Culture   Final    CULTURE REINCUBATED FOR BETTER GROWTH Performed at [facility], [address], [place], [place] [address]    Report Status PENDING  Incomplete         Radiology Studies: Dg Fluoro Guide Lumbar Puncture  Result Date: 11/27/2017 CLINICAL DATA:  AIDS.  Evaluate for cryptococcal meningitis. EXAM: DIAGNOSTIC LUMBAR PUNCTURE UNDER FLUOROSCOPIC GUIDANCE FLUOROSCOPY TIME:  Fluoroscopy Time:  1 minute 14 seconds Radiation Exposure Index (if provided by the fluoroscopic device): 10 mGy Number of Acquired Spot Images: 1 PROCEDURE: Informed consent was obtained from the patient prior to the procedure, including potential complications of headache, allergy, and pain. With the patient prone, the lower back was prepped with Betadine. 1% Lidocaine was used for local anesthesia. Lumbar puncture was performed at the L2-3 level using a 20 gauge needle with return of clear CSF with an  opening pressure of 36 cm water. 9.5 ml of CSF were obtained for laboratory studies. The patient tolerated the procedure well and there were no apparent complications. IMPRESSION: 1. Successful lumbar puncture  at L2-3. 2. Opening pressure of 36 cm water.  9.5 cc of CSF collected. Electronically Signed   By: Signa Kell M.D.   On: 11/27/2017 15:43        Scheduled Meds: . carvedilol  3.125 mg Oral BID WC  . dextrose  10 mL Intravenous Q24H  . dextrose  10 mL Intravenous Q24H  . docusate  25 mg Both EARS BID  . feeding supplement (ENSURE ENLIVE)  237 mL Oral TID BM  . flucytosine  25 mg/kg Oral Q6H  . lisinopril  2.5 mg Oral Daily  . multivitamin with minerals  1 tablet Oral Daily  . sodium chloride  500 mL Intravenous Q24H  . sodium chloride  500 mL Intravenous Q24H  . sodium chloride flush  3 mL Intravenous Q12H  . sulfamethoxazole-trimethoprim  1 tablet Oral Daily   Continuous Infusions: . amphotericin  B  Liposome (AMBISOME) ADULT IV Stopped (11/28/17 1918)     LOS: 2 days    Time spent: 35 minutes    Ramiro Harvest, MD [facility] Pager [phone]  If 7PM-7AM, please contact night-coverage [email or web address] Password [facility] 11/29/2017, 12:43 PM

## 2017-11-29 NOTE — Consult Note (Signed)
Primary Physician: Primary Cardiologist:  New    Asked to see re LV dysfunction by Dr Janee Mornhompson  HPI:  Pt is a 57 yo with history of HIV/AIDS,  anemia, HTN and noncompliance  He was treated (?partially) for cryptococcal menigitis in Fall 2018  Took 4 wks fluconazole  Had been off.   Pt  presented to ED with weakness.  Episode of near syncope prior with ? Seizure  (urinated on self)   Pt says he just felt his legs giving out   Denies palpitations   No CP  Breathing is OK   Symptoms started 2 days prior to admit  SInce admit amphotericin and flucytosine started    LP  Opening pressure 36 cm  Yeast present in CSF  Echo yesterday LVEF 30 to 35%  LV size and thickness normal   RVEF normal No prior echo    Very difficult study    Pt currently says he feels OK  No dizziness  No Cp  Breathing is OK       Past Medical History:  Diagnosis Date  . AIDS (HCC)   . Anemia   . Anxiety   . Cellulitis of right hand 06/23/2017  . Hepatitis B   . HIV disease (HCC)   . Hypertension   . Immune deficiency disorder (HCC)     Medications Prior to Admission  Medication Sig Dispense Refill  . ibuprofen (ADVIL,MOTRIN) 200 MG tablet Take 200-600 mg by mouth every 6 (six) hours as needed.    Marland Kitchen. amLODipine (NORVASC) 10 MG tablet Take 1 tablet (10 mg total) by mouth daily. (Patient not taking: Reported on 09/09/2017) 30 tablet 0  . feeding supplement, ENSURE ENLIVE, (ENSURE ENLIVE) LIQD Take 237 mLs by mouth 2 (two) times daily between meals. (Patient not taking: Reported on 09/09/2017) 237 mL 12  . fluconazole (DIFLUCAN) 200 MG tablet Take 4 tablets (800 mg total) by mouth daily. 4 tablets qday x 14 days, then 2 tablets daily (Patient not taking: Reported on 11/26/2017) 90 tablet 5  . polyvinyl alcohol (LIQUIFILM TEARS) 1.4 % ophthalmic solution Place 1 drop into both eyes at bedtime. (Patient not taking: Reported on 09/09/2017) 15 mL 0  . sulfamethoxazole-trimethoprim (BACTRIM DS,SEPTRA DS) 800-160 MG tablet  Take 1 tablet by mouth daily. (Patient not taking: Reported on 11/26/2017) 30 tablet 11     . carvedilol  3.125 mg Oral BID WC  . dextrose  10 mL Intravenous Q24H  . dextrose  10 mL Intravenous Q24H  . docusate  25 mg Both EARS BID  . feeding supplement (ENSURE ENLIVE)  237 mL Oral TID BM  . flucytosine  25 mg/kg Oral Q6H  . lisinopril  2.5 mg Oral Daily  . multivitamin with minerals  1 tablet Oral Daily  . sodium chloride  500 mL Intravenous Q24H  . sodium chloride  500 mL Intravenous Q24H  . sodium chloride flush  3 mL Intravenous Q12H  . sulfamethoxazole-trimethoprim  1 tablet Oral Daily    Infusions: . amphotericin  B  Liposome (AMBISOME) ADULT IV Stopped (11/28/17 1918)  . magnesium sulfate 1 - 4 g bolus IVPB 4 g (11/29/17 1015)    No Known Allergies  Social History   Socioeconomic History  . Marital status: Married    Spouse name: Not on file  . Number of children: Not on file  . Years of education: Not on file  . Highest education level: Not on file  Social Needs  .  Financial resource strain: Not on file  . Food insecurity - worry: Not on file  . Food insecurity - inability: Not on file  . Transportation needs - medical: Not on file  . Transportation needs - non-medical: Not on file  Occupational History  . Not on file  Tobacco Use  . Smoking status: Current Some Day Smoker    Packs/day: 0.30    Types: Cigarettes  . Smokeless tobacco: Never Used  . Tobacco comment: pt. given quit line information  Substance and Sexual Activity  . Alcohol use: Yes    Alcohol/week: 2.0 oz    Types: 4 Standard drinks or equivalent per week    Comment: beer  . Drug use: Yes    Types: Cocaine    Comment: crack- last use 03/2012. no h/o IVDU, other illicits  . Sexual activity: Yes    Partners: Male    Comment: patient declined condoms  Other Topics Concern  . Not on file  Social History Narrative  . Not on file    Family History  Problem Relation Age of Onset  .  Hypertension Mother     REVIEW OF SYSTEMS:  All systems reviewed  Negative to the above problem except as noted above.    PHYSICAL EXAM: Vitals:   11/28/17 2237 11/29/17 0548  BP: (!) 161/109 (!) 150/98  Pulse: 81 (!) 108  Resp: 17 17  Temp: 98.2 F (36.8 C) 98.5 F (36.9 C)  SpO2: 99% 97%     Intake/Output Summary (Last 24 hours) at 11/29/2017 1050 Last data filed at 11/29/2017 0500 Gross per 24 hour  Intake 11930.33 ml  Output 3440 ml  Net 8490.33 ml    General:  Thin 57 yo in NAD   HEENT: normal Neck: supple. no JVD. Carotids 2+ bilat; no bruits. No lymphadenopathy or thryomegaly appreciated. Cor: PMI nondisplaced. Regular rate & rhythm. No rubs, gallops or murmurs. Lungs: clear Abdomen: soft, nontender, nondistended. No hepatosplenomegaly. No bruits or masses. Good bowel sounds. Extremities: no cyanosis, clubbing, rash, edema Neuro: alert & oriented x 3, cranial nerves grossly intact. moves all 4 extremities w/o difficulty. Affect pleasant.  ECG:  Sinus tachycardia  QT prolonged (difficult to calc with ST, prob shorter than reported)  Results for orders placed or performed during the hospital encounter of 11/26/17 (from the past 24 hour(s))  Basic metabolic panel     Status: Abnormal   Collection Time: 11/29/17  5:28 AM  Result Value Ref Range   Sodium 131 (L) 135 - 145 mmol/L   Potassium 3.5 3.5 - 5.1 mmol/L   Chloride 101 101 - 111 mmol/L   CO2 25 22 - 32 mmol/L   Glucose, Bld 97 65 - 99 mg/dL   BUN 11 6 - 20 mg/dL   Creatinine, Ser 1.61 0.61 - 1.24 mg/dL   Calcium 8.0 (L) 8.9 - 10.3 mg/dL   GFR calc non Af Amer >60 >60 mL/min   GFR calc Af Amer >60 >60 mL/min   Anion gap 5 5 - 15  Magnesium     Status: None   Collection Time: 11/29/17  5:28 AM  Result Value Ref Range   Magnesium 1.7 1.7 - 2.4 mg/dL  CBC with Differential/Platelet     Status: Abnormal   Collection Time: 11/29/17  5:28 AM  Result Value Ref Range   WBC 2.3 (L) 4.0 - 10.5 K/uL   RBC 3.55  (L) 4.22 - 5.81 MIL/uL   Hemoglobin 9.8 (L) 13.0 - 17.0 g/dL  HCT 29.3 (L) 39.0 - 52.0 %   MCV 82.5 78.0 - 100.0 fL   MCH 27.6 26.0 - 34.0 pg   MCHC 33.4 30.0 - 36.0 g/dL   RDW 16.1 09.6 - 04.5 %   Platelets 76 (L) 150 - 400 K/uL   Neutrophils Relative % 72 %   Neutro Abs 1.7 1.7 - 7.7 K/uL   Lymphocytes Relative 16 %   Lymphs Abs 0.4 (L) 0.7 - 4.0 K/uL   Monocytes Relative 7 %   Monocytes Absolute 0.2 0.1 - 1.0 K/uL   Eosinophils Relative 5 %   Eosinophils Absolute 0.1 0.0 - 0.7 K/uL   Basophils Relative 0 %   Basophils Absolute 0.0 0.0 - 0.1 K/uL   Dg Fluoro Guide Lumbar Puncture  Result Date: 11/27/2017 CLINICAL DATA:  AIDS.  Evaluate for cryptococcal meningitis. EXAM: DIAGNOSTIC LUMBAR PUNCTURE UNDER FLUOROSCOPIC GUIDANCE FLUOROSCOPY TIME:  Fluoroscopy Time:  1 minute 14 seconds Radiation Exposure Index (if provided by the fluoroscopic device): 10 mGy Number of Acquired Spot Images: 1 PROCEDURE: Informed consent was obtained from the patient prior to the procedure, including potential complications of headache, allergy, and pain. With the patient prone, the lower back was prepped with Betadine. 1% Lidocaine was used for local anesthesia. Lumbar puncture was performed at the L2-3 level using a 20 gauge needle with return of clear CSF with an opening pressure of 36 cm water. 9.5 ml of CSF were obtained for laboratory studies. The patient tolerated the procedure well and there were no apparent complications. IMPRESSION: 1. Successful lumbar puncture at L2-3. 2. Opening pressure of 36 cm water.  9.5 cc of CSF collected. Electronically Signed   By: Signa Kell M.D.   On: 11/27/2017 15:43     ASSESSMENT:  Unfortunate 57 yo presents with weakness, presyncope  , ? Seizure   Being treated for cryptococcal meningtits Echo done showed LVEF 30 to 35%   EKG with some QT prolongation  Chronic  Since admit patient has received hydration (12L) and antifungals 1.  Weakness/presyncope   May be  from dehydration  I am not convniced arrhythmia (although he is at risk)  Volume on exam looks OK I would continue tele.  Avoid drugs that prolong QT interval  2  Prolonged QT   Follow periodic EKG s  Need to be careful with drugs that prolong QT  3Systolic CHF  Newly diagnosed   Pt denies CP   EKG without Q waves   ? Due to HTN   ? HIV   ? Idiopathic   Check TSH  For now would treat medically with b blocker coreg Would not pursue ischemic evaluation now with ongoing infectious issues.  He  is not having CP   Low NA diet  He would not be a candidate for any device therapy until he shows compliance and continued  LV dysfunction after a period of medical Rx

## 2017-11-29 NOTE — Progress Notes (Signed)
Patient ID: Kenneth Rivas, male   DOB: Dec 14, 1960, 57 y.o.   MRN: [id number]          [facility]  Date of Admission:  11/26/2017           Day 3 amphotericin        Day 3 flucytosine ASSESSMENT: He has persistent cryptococcal meningitis complicating advanced, uncontrolled HIV infection.  CSF cultures are growing yeast again.  He is showing no signs of increased intracranial pressure.  He is tolerating amphotericin and flucytosine well.  PLAN: 1. Continue current antifungal therapy  Principal Problem:   Cryptococcal meningoencephalitis (HCC) Active Problems:   AIDS (acquired immune deficiency syndrome) (HCC)   HTN (hypertension)   Cachexia associated with AIDS (HCC)   Hypokalemia   Hyponatremia   Protein-calorie malnutrition, severe   Prolonged QT interval   Near syncope   Cryptococcal meningitis (HCC)   Anemia   Abnormal echocardiogram   Cardiomyopathy [facility]): Per 2 d echo 11/28/2017   Scheduled Meds: . carvedilol  3.125 mg Oral BID WC  . dextrose  10 mL Intravenous Q24H  . dextrose  10 mL Intravenous Q24H  . docusate  25 mg Both EARS BID  . feeding supplement (ENSURE ENLIVE)  237 mL Oral TID BM  . flucytosine  25 mg/kg Oral Q6H  . lisinopril  2.5 mg Oral Daily  . multivitamin with minerals  1 tablet Oral Daily  . sodium chloride  500 mL Intravenous Q24H  . sodium chloride  500 mL Intravenous Q24H  . sodium chloride flush  3 mL Intravenous Q12H  . sulfamethoxazole-trimethoprim  1 tablet Oral Daily   Continuous Infusions: . amphotericin  B  Liposome (AMBISOME) ADULT IV Stopped (11/28/17 1918)  . magnesium sulfate 1 - 4 g bolus IVPB 4 g (11/29/17 1015)   PRN Meds:.acetaminophen **OR** acetaminophen, diphenhydrAMINE **OR** diphenhydrAMINE, hydrALAZINE, meperidine (DEMEROL) injection   SUBJECTIVE: He denies headache.  Review of Systems: Review of Systems  Constitutional: Negative for chills, diaphoresis and fever.  Gastrointestinal:  Negative for abdominal pain, diarrhea, nausea and vomiting.  Neurological: Negative for headaches.    No Known Allergies  OBJECTIVE: Vitals:   11/28/17 0500 11/28/17 1433 11/28/17 2237 11/29/17 0548  BP:  (!) 146/91 (!) 161/109 (!) 150/98  Pulse:  84 81 (!) 108  Resp:  18 17 17   Temp:  98.6 F (37 C) 98.2 F (36.8 C) 98.5 F (36.9 C)  TempSrc:  Oral Oral Oral  SpO2:  100% 99% 97%  Weight: 111 lb 12.4 oz (50.7 kg)     Height:       Body mass index is 18.04 kg/m.  Physical Exam  Constitutional:  He was asleep but arouses easily.  He does not appear to be in any distress.  He answers questions appropriately.  Eyes: EOM are normal.  Neck: Neck supple.  Cardiovascular: Normal rate and regular rhythm.  No murmur heard. Pulmonary/Chest: Effort normal. He has no wheezes. He has no rales.  Abdominal: Soft. He exhibits no distension. There is no tenderness.  Neurological: He is alert.  Skin: No rash noted.  Psychiatric: Mood and affect normal.    Lab Results Lab Results  Component Value Date   WBC 2.3 (L) 11/29/2017   HGB 9.8 (L) 11/29/2017   HCT 29.3 (L) 11/29/2017   MCV 82.5 11/29/2017   PLT 76 (L) 11/29/2017    Lab Results  Component Value Date   CREATININE 0.68 11/29/2017   BUN 11 11/29/2017  NA 131 (L) 11/29/2017   K 3.5 11/29/2017   CL 101 11/29/2017   CO2 25 11/29/2017    Lab Results  Component Value Date   ALT 78 (H) 11/26/2017   AST 38 11/26/2017   ALKPHOS 133 (H) 11/26/2017   BILITOT 1.0 11/26/2017     Microbiology: Recent Results (from the past 240 hour(s))  CSF culture     Status: None (Preliminary result)   Collection Time: 11/27/17  3:33 PM  Result Value Ref Range Status   Specimen Description CSF  Final   Special Requests NONE  Final   Gram Stain   Final    WBC PRESENT, PREDOMINANTLY MONONUCLEAR YEAST PRESENT CYTOSPIN SMEAR CRITICAL RESULT CALLED TO, READ BACK BY AND VERIFIED WITH: R.CORCORAN AT 1752 ON 11/27/17 BY N.THOMPSON     Culture   Final    ABUNDANT YEAST IDENTIFICATION TO FOLLOW Performed at [facility], [address] [place], [place] [address]    Report Status PENDING  Incomplete  Culture, fungus without smear     Status: None (Preliminary result)   Collection Time: 11/27/17  3:33 PM  Result Value Ref Range Status   Specimen Description CSF  Final   Special Requests PENDING  Incomplete   Culture   Final    NO GROWTH < 24 HOURS Performed at [facility], [address] [place], [place] [address]    Report Status PENDING  Incomplete    Cliffton AstersJohn Blondell Laperle, MD [facility] [phone] pager   [phone] cell 11/29/2017, 11:19 AM

## 2017-11-30 DIAGNOSIS — I420 Dilated cardiomyopathy: Secondary | ICD-10-CM

## 2017-11-30 LAB — BASIC METABOLIC PANEL
ANION GAP: 6 (ref 5–15)
BUN: 12 mg/dL (ref 6–20)
CALCIUM: 7.9 mg/dL — AB (ref 8.9–10.3)
CO2: 23 mmol/L (ref 22–32)
Chloride: 96 mmol/L — ABNORMAL LOW (ref 101–111)
Creatinine, Ser: 0.65 mg/dL (ref 0.61–1.24)
GFR calc non Af Amer: >60 mL/min (ref >60)
Glucose, Bld: 91 mg/dL (ref 65–99)
Potassium: 3.9 mmol/L (ref 3.5–5.1)
SODIUM: 125 mmol/L — AB (ref 135–145)

## 2017-11-30 LAB — CBC WITH DIFFERENTIAL/PLATELET
BASOS ABS: 0 10*3/uL (ref 0.0–0.1)
Basophils Relative: 2 %
EOS ABS: 0.2 10*3/uL (ref 0.0–0.7)
Eosinophils Relative: 10 %
HCT: 29.2 % — ABNORMAL LOW (ref 39.0–52.0)
Hemoglobin: 9.9 g/dL — ABNORMAL LOW (ref 13.0–17.0)
LYMPHS ABS: 0.4 10*3/uL — AB (ref 0.7–4.0)
Lymphocytes Relative: 22 %
MCH: 28 pg (ref 26.0–34.0)
MCHC: 33.9 g/dL (ref 30.0–36.0)
MCV: 82.5 fL (ref 78.0–100.0)
MONO ABS: 0.2 10*3/uL (ref 0.1–1.0)
MONOS PCT: 10 %
Neutro Abs: 1 10*3/uL — ABNORMAL LOW (ref 1.7–7.7)
Neutrophils Relative %: 56 %
PLATELETS: 78 10*3/uL — AB (ref 150–400)
RBC: 3.54 MIL/uL — AB (ref 4.22–5.81)
RDW: 13.7 % (ref 11.5–15.5)
WBC: 1.8 10*3/uL — AB (ref 4.0–10.5)

## 2017-11-30 LAB — URINALYSIS, ROUTINE W REFLEX MICROSCOPIC
BILIRUBIN URINE: NEGATIVE
Glucose, UA: 50 mg/dL — AB
HGB URINE DIPSTICK: NEGATIVE
KETONES UR: NEGATIVE mg/dL
Leukocytes, UA: NEGATIVE
NITRITE: NEGATIVE
Protein, ur: NEGATIVE mg/dL
Specific Gravity, Urine: 1.009 (ref 1.005–1.030)
pH: 8 (ref 5.0–8.0)

## 2017-11-30 LAB — CSF CULTURE

## 2017-11-30 LAB — OSMOLALITY, URINE: Osmolality, Ur: 375 mOsm/kg (ref 300–900)

## 2017-11-30 LAB — CREATININE, URINE, RANDOM: CREATININE, URINE: 28.99 mg/dL

## 2017-11-30 LAB — OSMOLALITY: Osmolality: 265 mOsm/kg — ABNORMAL LOW (ref 275–295)

## 2017-11-30 LAB — CORTISOL: CORTISOL PLASMA: 15.5 ug/dL

## 2017-11-30 LAB — MAGNESIUM: Magnesium: 1.9 mg/dL (ref 1.7–2.4)

## 2017-11-30 LAB — CSF CULTURE W GRAM STAIN

## 2017-11-30 LAB — SODIUM, URINE, RANDOM: Sodium, Ur: 101 mmol/L

## 2017-11-30 LAB — PATHOLOGIST SMEAR REVIEW

## 2017-11-30 MED ORDER — POTASSIUM CHLORIDE CRYS ER 20 MEQ PO TBCR
20.0000 meq | EXTENDED_RELEASE_TABLET | Freq: Once | ORAL | Status: AC
  Administered 2017-11-30: 20 meq via ORAL
  Filled 2017-11-30: qty 1

## 2017-11-30 MED ORDER — LIVING BETTER WITH HEART FAILURE BOOK
Freq: Once | Status: AC
  Administered 2017-11-30: 11:00:00

## 2017-11-30 MED ORDER — MAGNESIUM SULFATE 2 GM/50ML IV SOLN
2.0000 g | Freq: Once | INTRAVENOUS | Status: AC
  Administered 2017-11-30: 2 g via INTRAVENOUS
  Filled 2017-11-30: qty 50

## 2017-11-30 MED ORDER — SODIUM CHLORIDE 0.9 % IV SOLN
INTRAVENOUS | Status: DC
  Administered 2017-11-30 – 2017-12-02 (×4): via INTRAVENOUS

## 2017-11-30 NOTE — Progress Notes (Signed)
Progress Note  Patient Name: Kenneth Rivas Date of Encounter: 11/30/2017  Primary Cardiologist: Dr. Tenny Crawoss  Subjective   Patient resting quietly. When awakened, he says he is feeling well without any CP or SOB. No orthopnea or edema.   Inpatient Medications    Scheduled Meds: . carvedilol  3.125 mg Oral BID WC  . dextrose  10 mL Intravenous Q24H  . dextrose  10 mL Intravenous Q24H  . docusate  25 mg Both EARS BID  . feeding supplement (ENSURE ENLIVE)  237 mL Oral TID BM  . flucytosine  25 mg/kg Oral Q6H  . lisinopril  5 mg Oral Daily  . multivitamin with minerals  1 tablet Oral Daily  . potassium chloride  20 mEq Oral Once  . sodium chloride  500 mL Intravenous Q24H  . sodium chloride  500 mL Intravenous Q24H  . sodium chloride flush  3 mL Intravenous Q12H  . sulfamethoxazole-trimethoprim  1 tablet Oral Daily   Continuous Infusions: . amphotericin  B  Liposome (AMBISOME) ADULT IV Stopped (11/29/17 1901)  . magnesium sulfate 1 - 4 g bolus IVPB     PRN Meds: acetaminophen **OR** acetaminophen, hydrALAZINE, meperidine (DEMEROL) injection   Vital Signs    Vitals:   11/29/17 1412 11/29/17 2100 11/30/17 0500 11/30/17 0506  BP: (!) 146/99 (!) 139/100  127/81  Pulse: 95 99  95  Resp: 18 18  18   Temp: 98.5 F (36.9 C) 98.4 F (36.9 C)  98.7 F (37.1 C)  TempSrc: Oral Oral  Oral  SpO2: 98% 99%  97%  Weight:   108 lb 14.5 oz (49.4 kg)   Height:        Intake/Output Summary (Last 24 hours) at 11/30/2017 0938 Last data filed at 11/30/2017 0500 Gross per 24 hour  Intake 2820 ml  Output 2600 ml  Net 220 ml   Filed Weights   11/27/17 0517 11/28/17 0500 11/30/17 0500  Weight: 103 lb 9.9 oz (47 kg) 111 lb 12.4 oz (50.7 kg) 108 lb 14.5 oz (49.4 kg)    Telemetry    NSR 80s QTC 460s - Personally Reviewed  Physical Exam   GEN: No acute distress. Frail and cachectic appering HEENT: Normocephalic, atraumatic, sclera non-icteric. Neck: No JVD or bruits. Cardiac: RRR no  murmurs, rubs, or gallops.  Radials/DP/PT 1+ and equal bilaterally.  Respiratory: Clear to auscultation bilaterally. Breathing is unlabored. GI: Soft, nontender, non-distended, BS +x 4. MS: generalized atrophy  Extremities: No clubbing or cyanosis. No edema. Distal pedal pulses are 2+ and equal bilaterally. Neuro:  AAOx3. Follows commands. Psych:  Responds to questions appropriately with a flattened affect.  Labs    Chemistry Recent Labs  Lab 11/26/17 1630  11/28/17 0429 11/29/17 0528 11/30/17 0447  NA 130*   < > 130* 131* 125*  K 3.1*   < > 3.4* 3.5 3.9  CL 89*   < > 99* 101 96*  CO2 28   < > 26 25 23   GLUCOSE 109*   < > 101* 97 91  BUN 12   < > 15 11 12   CREATININE 0.79   < > 0.79 0.68 0.65  CALCIUM 8.8*   < > 7.8* 8.0* 7.9*  PROT 8.6*  --   --   --   --   ALBUMIN 3.4*  --   --   --   --   AST 38  --   --   --   --   ALT 78*  --   --   --   --  ALKPHOS 133*  --   --   --   --   BILITOT 1.0  --   --   --   --   GFRNONAA >60   < > >60 >60 >60  GFRAA >60   < > >60 >60 >60  ANIONGAP 13   < > 5 5 6    < > = values in this interval not displayed.     Hematology Recent Labs  Lab 11/28/17 0429 11/28/17 0824 11/29/17 0528 11/30/17 0447  WBC 3.3*  --  2.3* 1.8*  RBC 3.51* 3.53* 3.55* 3.54*  HGB 9.5*  --  9.8* 9.9*  HCT 29.2*  --  29.3* 29.2*  MCV 83.2  --  82.5 82.5  MCH 27.1  --  27.6 28.0  MCHC 32.5  --  33.4 33.9  RDW 13.6  --  13.6 13.7  PLT 73*  --  76* 78*    Cardiac Enzymes Recent Labs  Lab 11/27/17 0853 11/27/17 1608 11/27/17 2205  TROPONINI 0.03* <0.03 <0.03    Recent Labs  Lab 11/26/17 1651  TROPIPOC 0.02     Radiology    No results found.  Cardiac Studies   Limited echo 11/28/17 Study Conclusions - Left ventricle: The cavity size was normal. Wall thickness was   normal. Systolic function was moderately to severely reduced. The   estimated ejection fraction was in the range of 30% to 35%.   Diffuse hypokinesis. Doppler parameters are  consistent with   abnormal left ventricular relaxation (grade 1 diastolic   dysfunction). Impressions: - Definity used; moderate to severe global reduction in LV systolic   function; mild diastolic dysfunction.   Patient Profile     57 y.o. 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 Cardiology consulted for QT prolongation and LV dysfunction. Initial echo 1/11 poor windows, follow-up echo 11/28/17 showed EF 30-35%, grade 1 diastolic dysfunction (no prior to compare to). Workup also shows hyponatremia, pancytopenia   Assessment & Plan    1. Weakness/presyncope in setting of HIV/AIDS and cryptococcal meningitis - not felt to be arrhythmogenic at this time but certainly at risk for this in future. IM is treating infectious issues in conjunction with ID.  2. QT prolongation - resolved, possibly due to acute illness. Refrain from using additional QT prolonging agents if possible. IM is managing lytes.  3. New cardiomyopathy - duration of LV dysfunction unknown as he has no prior echo on file. Etiology could include HIV, viral or HTN, cannot exclude ischemia. With ongoing infectious issues and noncompliance as well as pancytopenia and cachexia, he is a poor candidate for invasive evaluation or even defibrillator therapy down the road should LV function persist. Agree with medical therapy for now. He appears euvolemic, would exercise caution with excess IV fluids. Continue carvedilol and lisinopril and titrate as you are able. Blood pressure is a little softer today than yesterday but if remains on same trajectory, consider titrating the above.  Did not review standard HF education today as patient did not seem engaged in visit and was resting when we entered. Will order CHF book and recommend patient be educated on low sodium diet, 2L fluid restriction, and daily. I'm not sure we need to continue to follow daily,  will review with MD. Please contact our team when patient is discharged to arrange close follow-up.  For questions or updates, please contact [facility] Please consult [email or web address] for contact info under Cardiology/STEMI.  Signed, Laurann Montana, PA-C 11/30/2017, 9:38 AM    Patient examined chart reviewed Cachextic male Lungs clear PMI increased no murmur . QT normalized Agree With lack of evidence that arrhythmia cause of pre syncope ECG otherwise normal Non ischemic aids related DCM Rx with coreg and ACE no further cardiac w/u planned will arrange outpatient f/u with Dr Tenny Craw if he will show up  Will sign off  Charlton Haws

## 2017-11-30 NOTE — Progress Notes (Signed)
Received call from lab Spinal fluid resulted, MD updated, will continue current treatment as ordered, per MD. SRP RN

## 2017-11-30 NOTE — Progress Notes (Signed)
[facility]    Date of Admission:  11/26/2017   Total days of antibiotics 4        Day 4 ampho/flucytosine           ID: Kenneth Rivas is a 57 y.o. male with advanced hiv disease, and recurrent CM Principal Problem:   Cryptococcal meningoencephalitis (HCC) Active Problems:   HTN (hypertension)   Cachexia associated with AIDS (HCC)   Hypokalemia   Hyponatremia   Protein-calorie malnutrition, severe   AIDS (acquired immune deficiency syndrome) (HCC)   Prolonged QT interval   Near syncope   Cryptococcal meningitis (HCC)   Anemia   Abnormal echocardiogram   Cardiomyopathy (HCC): Per 2 d echo 11/28/2017    Subjective: He remains afebrile, easily awakens from sleep, and reports he is hungry. He was seen by cardiology for new cardiomyopathy. Long QT resolved. He reports mild headache, no eye pain no blurry vision. No n/v.  cSF confirmed Crypto on cx  Medications:  . carvedilol  3.125 mg Oral BID WC  . dextrose  10 mL Intravenous Q24H  . dextrose  10 mL Intravenous Q24H  . docusate  25 mg Both EARS BID  . feeding supplement (ENSURE ENLIVE)  237 mL Oral TID BM  . flucytosine  25 mg/kg Oral Q6H  . lisinopril  5 mg Oral Daily  . multivitamin with minerals  1 tablet Oral Daily  . sodium chloride  500 mL Intravenous Q24H  . sodium chloride  500 mL Intravenous Q24H  . sodium chloride flush  3 mL Intravenous Q12H  . sulfamethoxazole-trimethoprim  1 tablet Oral Daily    Objective: Vital signs in last 24 hours: Temp:  [98.4 F (36.9 C)-98.7 F (37.1 C)] 98.4 F (36.9 C) (01/14 1430) Pulse Rate:  [95-111] 111 (01/14 1430) Resp:  [16-18] 16 (01/14 1430) BP: (120-139)/(81-100) 120/90 (01/14 1430) SpO2:  [97 %-99 %] 99 % (01/14 1430) Weight:  [108 lb 14.5 oz (49.4 kg)] 108 lb 14.5 oz (49.4 kg) (01/14 0500) Physical Exam  Constitutional: He is oriented to person, place. He appears cachetic, and chronically ill. No distress.  HENT:  Mouth/Throat:  Oropharynx is clear and moist. No oropharyngeal exudate. +thrush Cardiovascular:tachy, regular rhythm and normal heart sounds. Exam reveals no gallop and no friction rub.  No murmur heard.  Pulmonary/Chest: Effort normal and breath sounds normal. No respiratory distress. He has no wheezes.  Abdominal: Soft. Bowel sounds are normal. He exhibits no distension. There is no tenderness.  Lymphadenopathy:  He has no cervical adenopathy.  Neurological: He is alert and oriented to person, place, and time.  Skin: Skin is ashy/dry with occasional excoriation marks Psychiatric: He has a normal mood and affect. His behavior is normal.   Lab Results Recent Labs    11/29/17 0528 11/30/17 0447  WBC 2.3* 1.8*  HGB 9.8* 9.9*  HCT 29.3* 29.2*  NA 131* 125*  K 3.5 3.9  CL 101 96*  CO2 25 23  BUN 11 12  CREATININE 0.68 0.65    Microbiology: 1/11 CSF + yeast, crypto 1/11 CSF Cr >1:2560, elevated OP of 35 Studies/Results: No results found.   Assessment/Plan: Cryptococcal meningitis - repeating re-induction. Continue on liposomal ampho plus flucytosine. Kidney function and potassium are stable. He appears to have decreased sodium on labs. May be due to IVF. Will need to continue to monitor electrolytes plus WBC -has element of leukopenia.  -would like repeat LP later this week to see if his opening  pressure is any different, hopefully improved. If he complains of HA tomorrow, would have low threshold to do it sooner  Leukopenia = continue to monitor. Will not dose adjust any of flucytosine for now  Cachexia = continue to monitor oral intake to see he is meeting his nutritional needs  hiv oi proph = continue with bactrim. Will give weekly dose of azithromycin later this week  Cardiomyopathy = appreciate cards input and recs.     [facility] Cell: [phone] Pager: [phone]  11/30/2017, 5:05 PM

## 2017-11-30 NOTE — Progress Notes (Signed)
PROGRESS NOTE    Kenneth Rivas  BJY:[id number] DOB: [birth year] DOA: 11/26/2017 PCP: Randall Hiss, MD    Brief Narrative:  Patient is a 57 year old gentleman history of AIDS last CD4 count less than 10, viral load of 155,000 October 2018, history of cryptococcal meningitis in the fall 2018 have been off and on antifungal medication.  Patient has been lost to follow-up in the ID clinic.  Vision presented to the ED with generalized weakness and per EMS report patient noted to have a near syncopal event.  No loss of consciousness however with complaints of weakness and dizziness on standing.  CT head which was done was negative.  Patient also noted to have QTC prolongation.  Patient admitted for further evaluation and management.  Patient for lumbar puncture per interventional radiology 11/27/2017.   Assessment & Plan:   Principal Problem:   Cryptococcal meningoencephalitis (HCC) Active Problems:   HTN (hypertension)   Cachexia associated with AIDS (HCC)   Hypokalemia   Hyponatremia   Protein-calorie malnutrition, severe   AIDS (acquired immune deficiency syndrome) (HCC)   Prolonged QT interval   Near syncope   Cryptococcal meningitis (HCC)   Anemia   Abnormal echocardiogram   Cardiomyopathy [facility]): Per 2 d echo 11/28/2017   1  Persistent cryptococcal meningitis. Patient noted to have a history of cryptococcal meningitis in the fall 2018 and patient noted to be off and on antifungal medication.  Patient last seen in ID clinic in October 2018.  At that time patient was to be started on fluconazole 800 mg daily however unknown as to whether patient actually completed his course of treatment.  Patient underwent lumbar puncture 11/27/2017 with elevated opening pressure of 36 cm of water and yeast noted on CSF stain. CSF culture positive for abundant cryptococcus neoformans.  ID has consulted on the patient and patient has been started on flucytosine as well as amphotericin B.  ID following  and appreciate their input and recommendations.  2 near syncope Was admitted with a near syncopal episode and noted per admitting physician to have some prolonged QT interval.  QT prolongation has resolved. Repeat  2D echo with a EF of 30-35% with diffuse hypokinesis.  May have also been secondary to persistent cryptococcal meningitis.  Patient on IV fluids.  Patient noted to have a prior history of meningitis due to cryptococcus species with medical noncompliance and concern for partially treated infection.  Patient also noted to be noncompliant with medications.  Electrolytes have been repleted. Continue antifungal agents.  IV fluids.  Supportive care.  3.  AIDS Patient noted to be HIV positive.  Patient has not been started on any ART therapy.  Concern about medical compliance.  Last viral load from 09/09/2017 was 155,000.  CD4 count pending and viral load 56,200.  Continue Bactrim for prophylaxis.  ID following.  4.  Prolonged QT interval/cardiomyopathy/abnormal 2D echo Replete electrolytes.  Repeat EKG with resolution of QT prolongation.  Repeat 2D echo with Definity with a  EF of 30-35%, diffuse hypokinesis, grade 1 diastolic dysfunction.  Due to abnormal 2D echo near syncopal episode, she was seen in consultation by cardiology.  Cardiology felt no further workup at this time.  Cardiology in agreement with beta-blocker and ACE inhibitor for now.  Outpatient follow-up.   5.  Hypokalemia/hyponatremia Replete.  Keep potassium greater than 4.  Keep magnesium greater than 2. Follow.   6.  Severe protein calorie malnutrition Continue Ensure supplementation.  Consult with dietitian.  7.  Hypertension  Stable.  Patient currently on Coreg and ACE inhibitor.  Follow.  8.  Cerumen impaction Patient noted to have decreased hearing.  Irrigation.  9.  Dehydration Improved with hydration.    10.  Anxiety No suicidal or homicidal ideation.  Follow.  11.  Chronic anemia Stable.  Anemia panel  consistent with anemia of chronic disease.  Hemoglobin currently at 9.9 from 9.8 from 9.5 from 12.4 likely dilutional effect.  No overt bleeding.  Follow H&H.  12.  Hyponatremia Questionable etiology.  Likely secondary to hypovolemic hyponatremia.  Check a urine and serum osmolality.  Check a fractional excretion of sodium.  Place on gentle hydration.  Repeat BMET this afternoon.  Follow.   DVT prophylaxis: SCDs Code Status: Full Family Communication: No family at bedside. Disposition Plan: To be determined.   Consultants:   Infectious diseases: Dr. Drue Second 11/27/2017  Cardiology: Dr. Tenny Craw 11/29/2017  Procedures:   Lumbar puncture per interventional radiology 11/27/2017  2D echo 11/27/2017, 11/28/2017  CT head without contrast 11/26/2017  Chest x-ray 11/26/2017    Antimicrobials:  IV amphotericin 11/27/2017  Flucytosine 11/27/2017   Subjective: Patient leaping easily arousable.  Denies chest pain.  No shortness of breath.  Up in bed.  Just ate his lunch.  Denies chest pain.  Denies shortness of breath.  Feeling better than on admission.    Objective: Vitals:   11/29/17 1412 11/29/17 2100 11/30/17 0500 11/30/17 0506  BP: (!) 146/99 (!) 139/100  127/81  Pulse: 95 99  95  Resp: 18 18  18   Temp: 98.5 F (36.9 C) 98.4 F (36.9 C)  98.7 F (37.1 C)  TempSrc: Oral Oral  Oral  SpO2: 98% 99%  97%  Weight:   49.4 kg (108 lb 14.5 oz)   Height:        Intake/Output Summary (Last 24 hours) at 11/30/2017 1136 Last data filed at 11/30/2017 0500 Gross per 24 hour  Intake 2580 ml  Output 2600 ml  Net -20 ml   Filed Weights   11/27/17 0517 11/28/17 0500 11/30/17 0500  Weight: 47 kg (103 lb 9.9 oz) 50.7 kg (111 lb 12.4 oz) 49.4 kg (108 lb 14.5 oz)    Examination:  General exam: Cachectic.  Frail.  Temporal wasting.  Chronically ill appearing.   Respiratory system: Clear to auscultation bilaterally.  No wheezes, no crackles, no rhonchi.   Cardiovascular system: Regular rate  and rhythm no murmurs rubs or gallops.  No JVD.  No lower extremity edema.  Gastrointestinal system: Abdomen is soft, scaphoid, nontender, nondistended, positive bowel sounds.  No rebound.  No guarding.  Central nervous system: Alert and following commands.  Moving extremities spontaneously.  Extremities: Symmetric 5 x 5 power. Skin: No rashes, lesions or ulcers Psychiatry: Judgement and insight appear poor-fair. Mood & affect appropriate.     Data Reviewed: I have personally reviewed following labs and imaging studies  CBC: Recent Labs  Lab 11/26/17 1630 11/26/17 1653 11/27/17 0443 11/28/17 0429 11/29/17 0528 11/30/17 0447  WBC 2.6*  --  3.1* 3.3* 2.3* 1.8*  NEUTROABS 2.0  --   --  2.6 1.7 1.0*  HGB 13.6 15.0 12.4* 9.5* 9.8* 9.9*  HCT 40.0 44.0 37.1* 29.2* 29.3* 29.2*  MCV 82.3  --  83.2 83.2 82.5 82.5  PLT 100*  --  110* 73* 76* 78*   Basic Metabolic Panel: Recent Labs  Lab 11/26/17 1630 11/26/17 1653 11/27/17 0443 11/27/17 0853 11/28/17 0429 11/29/17 0528 11/30/17 0447  NA 130* 134* 131*  --  130* 131* 125*  K 3.1* 3.2* 3.5  --  3.4* 3.5 3.9  CL 89* 88* 94*  --  99* 101 96*  CO2 28  --  27  --  26 25 23   GLUCOSE 109* 110* 104*  --  101* 97 91  BUN 12 11 14   --  15 11 12   CREATININE 0.79 0.70 0.73  --  0.79 0.68 0.65  CALCIUM 8.8*  --  8.8*  --  7.8* 8.0* 7.9*  MG 1.7  --   --  2.2 1.9 1.7 1.9  PHOS 3.9  --   --   --   --   --   --    GFR: Estimated Creatinine Clearance: 70.3 mL/min (by C-G formula based on SCr of 0.65 mg/dL). Liver Function Tests: Recent Labs  Lab 11/26/17 1630  AST 38  ALT 78*  ALKPHOS 133*  BILITOT 1.0  PROT 8.6*  ALBUMIN 3.4*   No results for input(s): LIPASE, AMYLASE in the last 168 hours. No results for input(s): AMMONIA in the last 168 hours. Coagulation Profile: Recent Labs  Lab 11/26/17 1700  INR 0.95   Cardiac Enzymes: Recent Labs  Lab 11/27/17 0853 11/27/17 1608 11/27/17 2205  TROPONINI 0.03* <0.03 <0.03   BNP  (last 3 results) No results for input(s): PROBNP in the last 8760 hours. HbA1C: No results for input(s): HGBA1C in the last 72 hours. CBG: No results for input(s): GLUCAP in the last 168 hours. Lipid Profile: No results for input(s): CHOL, HDL, LDLCALC, TRIG, CHOLHDL, LDLDIRECT in the last 72 hours. Thyroid Function Tests: Recent Labs    11/29/17 1438  TSH 1.209   Anemia Panel: Recent Labs    11/28/17 0824  VITAMINB12 781  FOLATE 9.0  FERRITIN 984*  TIBC 216*  IRON 52  RETICCTPCT 0.7   Sepsis Labs: No results for input(s): PROCALCITON, LATICACIDVEN in the last 168 hours.  Recent Results (from the past 240 hour(s))  CSF culture     Status: None (Preliminary result)   Collection Time: 11/27/17  3:33 PM  Result Value Ref Range Status   Specimen Description CSF  Final   Special Requests NONE  Final   Gram Stain   Final    WBC PRESENT, PREDOMINANTLY MONONUCLEAR YEAST PRESENT CYTOSPIN SMEAR CRITICAL RESULT CALLED TO, READ BACK BY AND VERIFIED WITH: R.CORCORAN AT 1752 ON 11/27/17 BY N.Beva Remund    Culture   Final    ABUNDANT YEAST IDENTIFICATION TO FOLLOW Performed at [facility], [address], [place], [place] [address]    Report Status PENDING  Incomplete  Culture, fungus without smear     Status: None (Preliminary result)   Collection Time: 11/27/17  3:33 PM  Result Value Ref Range Status   Specimen Description CSF  Final   Special Requests PENDING  Incomplete   Culture   Final    YEAST CULTURE REINCUBATED FOR BETTER GROWTH Performed at [facility], [address], [place], [place] [address]    Report Status PENDING  Incomplete         Radiology Studies: No results found.      Scheduled Meds: . carvedilol  3.125 mg Oral BID WC  . dextrose  10 mL Intravenous Q24H  . dextrose  10 mL Intravenous Q24H  . docusate  25 mg Both EARS BID  . feeding supplement (ENSURE ENLIVE)  237 mL Oral TID BM  . flucytosine  25 mg/kg Oral Q6H  .  lisinopril  5 mg Oral Daily  . multivitamin with minerals  1 tablet Oral Daily  . sodium chloride  500 mL Intravenous Q24H  . sodium chloride  500 mL Intravenous Q24H  . sodium chloride flush  3 mL Intravenous Q12H  . sulfamethoxazole-trimethoprim  1 tablet Oral Daily   Continuous Infusions: . sodium chloride 75 mL/hr at 11/30/17 1112  . amphotericin  B  Liposome (AMBISOME) ADULT IV Stopped (11/29/17 1901)  . magnesium sulfate 1 - 4 g bolus IVPB 2 g (11/30/17 1111)     LOS: 3 days    Time spent: 35 minutes    Ramiro Harvestaniel Katryn Plummer, MD [facility] Pager [phone]  If 7PM-7AM, please contact night-coverage [email or web address] Password [email or web address] 11/30/2017, 11:36 AM

## 2017-11-30 NOTE — Progress Notes (Signed)
PHARMACY BRIEF NOTE:   ELECTROLYTE REPLACEMENT PER LIPSOMAL AMPHOTERICIN B PROTOCOL  Ambisome 240 mg (5 mg/kg) IV q24h in combination with flucytosine 1250 mg (25 mg/kg) PO q6h  for cryptococcal meningitis.   ID service was consulted and is actively following.  Recent Labs    11/29/17 0528 11/30/17 0447  NA 131* 125*  K 3.5 3.9  CL 101 96*  CO2 25 23  GLUCOSE 97 91  BUN 11 12  CREATININE 0.68 0.65  CALCIUM 8.0* 7.9*  MG 1.7 1.9    Assessment: K 3.9 after 40 PO kdur yesterday, goal > 4 Mag 1.9 after 4 gm IV bolus yesterday, goal > 2   Plan, per Amphotericin B Protocol: KDur 20mEq PO x 1 today only) Mag Sulfate 2 grams IV x 1 this AM Continue NS 500 mL bolus IV before and after each infusion of Ambisome Continue daily BMet, Mg Herby AbrahamMichelle T. Madia Carvell, Pharm.D. [phone] 11/30/2017 8:32 AM

## 2017-12-01 DIAGNOSIS — D649 Anemia, unspecified: Secondary | ICD-10-CM

## 2017-12-01 DIAGNOSIS — R931 Abnormal findings on diagnostic imaging of heart and coronary circulation: Secondary | ICD-10-CM

## 2017-12-01 DIAGNOSIS — I10 Essential (primary) hypertension: Secondary | ICD-10-CM

## 2017-12-01 DIAGNOSIS — R55 Syncope and collapse: Secondary | ICD-10-CM

## 2017-12-01 DIAGNOSIS — E43 Unspecified severe protein-calorie malnutrition: Secondary | ICD-10-CM

## 2017-12-01 DIAGNOSIS — B451 Cerebral cryptococcosis: Secondary | ICD-10-CM

## 2017-12-01 DIAGNOSIS — E871 Hypo-osmolality and hyponatremia: Secondary | ICD-10-CM

## 2017-12-01 DIAGNOSIS — R888 Abnormal findings in other body fluids and substances: Secondary | ICD-10-CM

## 2017-12-01 DIAGNOSIS — E876 Hypokalemia: Secondary | ICD-10-CM

## 2017-12-01 DIAGNOSIS — R9431 Abnormal electrocardiogram [ECG] [EKG]: Secondary | ICD-10-CM

## 2017-12-01 LAB — BASIC METABOLIC PANEL
Anion gap: 6 (ref 5–15)
BUN: 14 mg/dL (ref 6–20)
CALCIUM: 8 mg/dL — AB (ref 8.9–10.3)
CO2: 25 mmol/L (ref 22–32)
CREATININE: 0.65 mg/dL (ref 0.61–1.24)
Chloride: 98 mmol/L — ABNORMAL LOW (ref 101–111)
GFR calc Af Amer: >60 mL/min (ref >60)
GLUCOSE: 91 mg/dL (ref 65–99)
Potassium: 4.4 mmol/L (ref 3.5–5.1)
Sodium: 129 mmol/L — ABNORMAL LOW (ref 135–145)

## 2017-12-01 LAB — CBC WITH DIFFERENTIAL/PLATELET
BASOS PCT: 1 %
Basophils Absolute: 0 10*3/uL (ref 0.0–0.1)
EOS ABS: 0.2 10*3/uL (ref 0.0–0.7)
Eosinophils Relative: 8 %
HCT: 29.3 % — ABNORMAL LOW (ref 39.0–52.0)
Hemoglobin: 9.7 g/dL — ABNORMAL LOW (ref 13.0–17.0)
Lymphocytes Relative: 16 %
Lymphs Abs: 0.4 10*3/uL — ABNORMAL LOW (ref 0.7–4.0)
MCH: 27.4 pg (ref 26.0–34.0)
MCHC: 33.1 g/dL (ref 30.0–36.0)
MCV: 82.8 fL (ref 78.0–100.0)
MONO ABS: 0.3 10*3/uL (ref 0.1–1.0)
Monocytes Relative: 11 %
NEUTROS PCT: 64 %
Neutro Abs: 1.5 10*3/uL — ABNORMAL LOW (ref 1.7–7.7)
Platelets: 104 10*3/uL — ABNORMAL LOW (ref 150–400)
RBC: 3.54 MIL/uL — ABNORMAL LOW (ref 4.22–5.81)
RDW: 13.7 % (ref 11.5–15.5)
WBC: 2.4 10*3/uL — ABNORMAL LOW (ref 4.0–10.5)

## 2017-12-01 LAB — MAGNESIUM: Magnesium: 1.6 mg/dL — ABNORMAL LOW (ref 1.7–2.4)

## 2017-12-01 MED ORDER — MAGNESIUM SULFATE 4 GM/100ML IV SOLN
4.0000 g | Freq: Once | INTRAVENOUS | Status: AC
  Administered 2017-12-01: 4 g via INTRAVENOUS
  Filled 2017-12-01: qty 100

## 2017-12-01 NOTE — Progress Notes (Signed)
F/u appt made 1/29, appt placed in epic. Dayna Dunn PA-C

## 2017-12-01 NOTE — Progress Notes (Signed)
PHARMACY BRIEF NOTE:   ELECTROLYTE REPLACEMENT PER LIPSOMAL AMPHOTERICIN B PROTOCOL  Ambisome 240 mg (5 mg/kg) IV q24h in combination with flucytosine 1250 mg (25 mg/kg) PO q6h  for cryptococcal meningitis.   ID service was consulted and is actively following.  Recent Labs    11/30/17 0447 12/01/17 0522  NA 125* 129*  K 3.9 4.4  CL 96* 98*  CO2 23 25  GLUCOSE 91 91  BUN 12 14  CREATININE 0.65 0.65  CALCIUM 7.9* 8.0*  MG 1.9 1.6*    Assessment: K 4.4 after 20 PO kdur yesterday, goal > 4 Mag 1.6 after 2 gm IV bolus yesterday, goal > 2   Plan, per Amphotericin B Protocol: Mag Sulfate 4 grams IV x 1 this AM Continue NS 500 mL bolus IV before and after each infusion of Ambisome Continue daily BMet, Mg  Herby AbrahamMichelle T. Nashaly Dorantes, Pharm.D. [phone] 12/01/2017 7:29 AM

## 2017-12-01 NOTE — Progress Notes (Signed)
[facility]    Date of Admission:  11/26/2017   Total days of antibiotics 5        Day 5 ampho/flucytosine           ID: Kenneth Rivas is a 57 y.o. male with advanced hiv disease, and recurrent CM Principal Problem:   Cryptococcal meningoencephalitis (HCC) Active Problems:   HTN (hypertension)   Cachexia associated with AIDS (HCC)   Hypokalemia   Hyponatremia   Protein-calorie malnutrition, severe   AIDS (acquired immune deficiency syndrome) (HCC)   Prolonged QT interval   Near syncope   Cryptococcal meningitis (HCC)   Anemia   Abnormal echocardiogram   Cardiomyopathy [facility](HCC): Per 2 d echo 11/28/2017    Subjective: He denies headache or blurry vision. Still hard of hearing  cSF confirmed Crypto on cx  Medications:  . carvedilol  3.125 mg Oral BID WC  . dextrose  10 mL Intravenous Q24H  . dextrose  10 mL Intravenous Q24H  . docusate  25 mg Both EARS BID  . feeding supplement (ENSURE ENLIVE)  237 mL Oral TID BM  . flucytosine  25 mg/kg Oral Q6H  . lisinopril  5 mg Oral Daily  . multivitamin with minerals  1 tablet Oral Daily  . sodium chloride  500 mL Intravenous Q24H  . sodium chloride  500 mL Intravenous Q24H  . sodium chloride flush  3 mL Intravenous Q12H  . sulfamethoxazole-trimethoprim  1 tablet Oral Daily    Objective: Vital signs in last 24 hours: Temp:  [98.3 F (36.8 C)-99.2 F (37.3 C)] 99.2 F (37.3 C) (01/15 1409) Pulse Rate:  [78-86] 81 (01/15 1700) Resp:  [15-16] 16 (01/15 0628) BP: (107-154)/(71-103) 130/86 (01/15 1700) SpO2:  [99 %-100 %] 99 % (01/15 1409) Weight:  [108 lb 7.5 oz (49.2 kg)] 108 lb 7.5 oz (49.2 kg) (01/15 0500) Physical Exam  Constitutional: He is oriented to person, place. He appears cachetic, and chronically ill. Sitting up eating lunch HENT: has cotton balls in his ear bilaterally for ear dropss Mouth/Throat: Oropharynx is clear and moist. No oropharyngeal exudate. +thrush Cardiovascular:tachy,  regular rhythm and normal heart sounds. No g/m/r/ Pulmonary/Chest: CTAB no w/c/r Abdominal: Soft. Bowel sounds are normal. He exhibits no distension. There is no tenderness.  Lymphadenopathy:  He has no cervical adenopathy.  Neurological: He is alert and oriented to person, place, and time.  Skin: Skin is ashy/dry with occasional excoriation marks Psychiatric: He has a normal mood and affect. His behavior is normal.   Lab Results Recent Labs    11/30/17 0447 12/01/17 0522  WBC 1.8* 2.4*  HGB 9.9* 9.7*  HCT 29.2* 29.3*  NA 125* 129*  K 3.9 4.4  CL 96* 98*  CO2 23 25  BUN 12 14  CREATININE 0.65 0.65    Microbiology: 1/11 CSF + yeast, crypto 1/11 CSF Cr >1:2560, elevated OP of 35 Studies/Results: No results found.   Assessment/Plan: Cryptococcal meningitis - plan is unchanged Continue on liposomal ampho plus flucytosine. Kidney function and potassium are stable.  continue to monitor electrolytes plus WBC -has element of leukopenia which is improved today  -would like repeat LP later this week to see if his opening pressure is any different, hopefully improved. If he complains of HA tomorrow, would have low threshold to do it sooner  Leukopenia = continue to monitor, appears improved Will not dose adjust any of flucytosine for now  Cachexia = continue to monitor oral intake to see  he is meeting his nutritional needs. Continue with nutritional supplements  hiv oi proph = continue with bactrim. Will give weekly dose of azithromycin on thursday      [facility] Cell: [phone] Pager: [phone]  12/01/2017, 6:22 PM

## 2017-12-01 NOTE — Progress Notes (Signed)
PT Cancellation Note  Patient Details Name: Kenneth Rivas MRN: [id number] DOB: Feb 10, 1961   Cancelled Treatment:    Reason Eval/Treat Not Completed: Fatigue/lethargy limiting ability to participate. Patient declined.    Rada HayHill, Rodel Glaspy Elizabeth 12/01/2017, 4:40 PM Blanchard KelchKaren Guenevere Roorda PT [phone]

## 2017-12-01 NOTE — Progress Notes (Addendum)
PROGRESS NOTE    Kenneth Rivas  ZOX:[id number] DOB: 1961/03/30 DOA: 11/26/2017 PCP: Randall Hiss, MD    Brief Narrative:  Patient is a 57 year old gentleman history of AIDS last CD4 count less than 10, viral load of 155,000 October 2018, history of cryptococcal meningitis in the fall 2018 have been off and on antifungal medication.  Patient has been lost to follow-up in the ID clinic.  Patient presented to the ED with generalized weakness and per EMS report patient noted to have a near syncopal event.  No loss of consciousness however with complaints of weakness and dizziness on standing.  CT head which was done was negative.  Patient also noted to have QTC prolongation.  Patient admitted for further evaluation and management.  Patient for lumbar puncture per interventional radiology 11/27/2017.   Assessment & Plan:   Principal Problem:   Cryptococcal meningoencephalitis (HCC) Active Problems:   HTN (hypertension)   Cachexia associated with AIDS (HCC)   Hypokalemia   Hyponatremia   Protein-calorie malnutrition, severe   AIDS (acquired immune deficiency syndrome) (HCC)   Prolonged QT interval   Near syncope   Cryptococcal meningitis (HCC)   Anemia   Abnormal echocardiogram   Cardiomyopathy [facility]): Per 2 d echo 11/28/2017   1  Persistent cryptococcal meningitis. Patient noted to have a history of cryptococcal meningitis in the fall 2018 and patient noted to be off and on antifungal medication.  Patient last seen in ID clinic in October 2018.  At that time patient was to be started on fluconazole 800 mg daily however unknown as to whether patient actually completed his course of treatment.  Patient underwent lumbar puncture 11/27/2017 with elevated opening pressure of 36 cm of water and yeast noted on CSF stain. CSF culture positive for abundant cryptococcus neoformans.  ID has consulted on the patient and patient has been started on flucytosine as well as amphotericin B. Per ID  patient will need repeat LP later this week to follow up on opening pressure. Per ID if patient complains of HA may need LP quicker.  ID following and appreciate their input and recommendations.  2 near syncope Was admitted with a near syncopal episode and noted per admitting physician to have some prolonged QT interval.  QT prolongation has resolved. Repeat  2D echo with a EF of 30-35% with diffuse hypokinesis.  May have also been secondary to persistent cryptococcal meningitis.  Patient on IV fluids.  Patient noted to have a prior history of meningitis due to cryptococcus species with medical noncompliance and concern for partially treated infection.  Patient also noted to be noncompliant with medications.  Electrolytes have been repleted. Continue antifungal agents.  IV fluids.  Supportive care.  3.  AIDS Patient noted to be HIV positive.  Patient has not been started on any ART therapy.  Concern about medical compliance.  Last viral load from 09/09/2017 was 155,000.  CD4 count pending and viral load 56,200.  Continue Bactrim for prophylaxis. Weekly dose of azithromycin to be given per ID later this week. ID following.  4.  Prolonged QT interval/cardiomyopathy/abnormal 2D echo Replete electrolytes.  Repeat EKG with resolution of QT prolongation.  Repeat 2D echo with Definity with a  EF of 30-35%, diffuse hypokinesis, grade 1 diastolic dysfunction.  Due to abnormal 2D echo near syncopal episode, 57 was seen in consultation by cardiology.  Cardiology felt no further workup at this time.  Cardiology in agreement with beta-blocker and ACE inhibitor for now.  Outpatient follow-up.  5.  Hypokalemia/hyponatremia Replete.  Potassium at 4.4.  Magnesium at 1.6.  We will give a dose of magnesium 4 g IV x1.  Keep magnesium greater than 2. Follow.   6.  Severe protein calorie malnutrition Continue Ensure supplementation.  Consult with dietitian.  7.  Hypertension Stable.  Continue current regimen of Coreg  and ACE inhibitor. Follow.  8.  Cerumen impaction Patient noted to have decreased hearing.  Irrigation.  9.  Dehydration Improved with hydration.    10.  Anxiety No suicidal or homicidal ideation.  Follow.  11.  Chronic anemia Stable.  Anemia panel consistent with anemia of chronic disease.  Hemoglobin currently at 9.7 from 9.9 from 9.8 from 9.5 from 12.4 likely dilutional effect.  No overt bleeding.  Follow H&H.  12.  Hyponatremia Questionable etiology.  Likely secondary to hypovolemic hyponatremia.  Sodium levels improved with gentle hydration.  Urine sodium was 101.  Urine creatinine was 28.99.  Urine osmolality was 375.  Continue gentle hydration for another 24 hours and then saline lock.  Follow.    DVT prophylaxis: SCDs Code Status: Full Family Communication: No family at bedside. Disposition Plan: To be determined.   Consultants:   Infectious diseases: Dr. Drue Second 11/27/2017  Cardiology: Dr. Tenny Craw 11/29/2017  Procedures:   Lumbar puncture per interventional radiology 11/27/2017  2D echo 11/27/2017, 11/28/2017  CT head without contrast 11/26/2017  Chest x-ray 11/26/2017    Antimicrobials:  IV amphotericin 11/27/2017  Flucytosine 11/27/2017   Subjective: Patient in bed.  Sleeping.  Easily arousable.  Denies headache.  Denies any visual changes.  Denies chest pain.  Denies shortness of breath.  States he ate all his breakfast.  Patient asking for spaghetti for lunch.   Objective: Vitals:   12/01/17 0628 12/01/17 1219 12/01/17 1400 12/01/17 1409  BP: (!) 147/95 (!) 142/103 (!) 154/101 (!) 140/100  Pulse: 83  78 78  Resp: 16     Temp: 98.8 F (37.1 C)   99.2 F (37.3 C)  TempSrc: Oral   Oral  SpO2: 99%   99%  Weight:      Height:        Intake/Output Summary (Last 24 hours) at 12/01/2017 1710 Last data filed at 12/01/2017 1655 Gross per 24 hour  Intake 4830 ml  Output 1900 ml  Net 2930 ml   Filed Weights   11/28/17 0500 11/30/17 0500 12/01/17 0500    Weight: 50.7 kg (111 lb 12.4 oz) 49.4 kg (108 lb 14.5 oz) 49.2 kg (108 lb 7.5 oz)    Examination:  General exam: Cachectic. Temporal wasting.  Chronically ill appearing.   Respiratory system: Lungs clear to auscultation bilaterally.  No wheezes, no crackles, no rhonchi.  Cardiovascular system: Regular rate and rhythm no murmurs rubs or gallops.  No JVD.  No lower extremity edema.  Gastrointestinal system: Abdomen is soft, scaphoid, nontender, nondistended, positive bowel sounds.  No rebound.  No guarding.  Central nervous system: Alert and following commands.  Moving extremities spontaneously.  Extremities: Symmetric 5 x 5 power. Skin: No rashes, lesions or ulcers Psychiatry: Judgement and insight appear poor-fair. Mood & affect appropriate.     Data Reviewed: I have personally reviewed following labs and imaging studies  CBC: Recent Labs  Lab 11/26/17 1630  11/27/17 0443 11/28/17 0429 11/29/17 0528 11/30/17 0447 12/01/17 0522  WBC 2.6*  --  3.1* 3.3* 2.3* 1.8* 2.4*  NEUTROABS 2.0  --   --  2.6 1.7 1.0* 1.5*  HGB 13.6   < >  12.4* 9.5* 9.8* 9.9* 9.7*  HCT 40.0   < > 37.1* 29.2* 29.3* 29.2* 29.3*  MCV 82.3  --  83.2 83.2 82.5 82.5 82.8  PLT 100*  --  110* 73* 76* 78* 104*   < > = values in this interval not displayed.   Basic Metabolic Panel: Recent Labs  Lab 11/26/17 1630  11/27/17 0443 11/27/17 0853 11/28/17 0429 11/29/17 0528 11/30/17 0447 12/01/17 0522  NA 130*   < > 131*  --  130* 131* 125* 129*  K 3.1*   < > 3.5  --  3.4* 3.5 3.9 4.4  CL 89*   < > 94*  --  99* 101 96* 98*  CO2 28  --  27  --  26 25 23 25   GLUCOSE 109*   < > 104*  --  101* 97 91 91  BUN 12   < > 14  --  15 11 12 14   CREATININE 0.79   < > 0.73  --  0.79 0.68 0.65 0.65  CALCIUM 8.8*  --  8.8*  --  7.8* 8.0* 7.9* 8.0*  MG 1.7  --   --  2.2 1.9 1.7 1.9 1.6*  PHOS 3.9  --   --   --   --   --   --   --    < > = values in this interval not displayed.   GFR: Estimated Creatinine Clearance: 70  mL/min (by C-G formula based on SCr of 0.65 mg/dL). Liver Function Tests: Recent Labs  Lab 11/26/17 1630  AST 38  ALT 78*  ALKPHOS 133*  BILITOT 1.0  PROT 8.6*  ALBUMIN 3.4*   No results for input(s): LIPASE, AMYLASE in the last 168 hours. No results for input(s): AMMONIA in the last 168 hours. Coagulation Profile: Recent Labs  Lab 11/26/17 1700  INR 0.95   Cardiac Enzymes: Recent Labs  Lab 11/27/17 0853 11/27/17 1608 11/27/17 2205  TROPONINI 0.03* <0.03 <0.03   BNP (last 3 results) No results for input(s): PROBNP in the last 8760 hours. HbA1C: No results for input(s): HGBA1C in the last 72 hours. CBG: No results for input(s): GLUCAP in the last 168 hours. Lipid Profile: No results for input(s): CHOL, HDL, LDLCALC, TRIG, CHOLHDL, LDLDIRECT in the last 72 hours. Thyroid Function Tests: Recent Labs    11/29/17 1438  TSH 1.209   Anemia Panel: No results for input(s): VITAMINB12, FOLATE, FERRITIN, TIBC, IRON, RETICCTPCT in the last 72 hours. Sepsis Labs: No results for input(s): PROCALCITON, LATICACIDVEN in the last 168 hours.  Recent Results (from the past 240 hour(s))  CSF culture     Status: None   Collection Time: 11/27/17  3:33 PM  Result Value Ref Range Status   Specimen Description CSF  Final   Special Requests NONE  Final   Gram Stain   Final    WBC PRESENT, PREDOMINANTLY MONONUCLEAR YEAST PRESENT CYTOSPIN SMEAR CRITICAL RESULT CALLED TO, READ BACK BY AND VERIFIED WITH: R.CORCORAN AT 1752 ON 11/27/17 BY N.Astha Probasco    Culture ABUNDANT CRYPTOCOCCUS NEOFORMANS  Final   Report Status 11/30/2017 FINAL  Final  Fungus Culture With Stain     Status: None (Preliminary result)   Collection Time: 11/27/17  3:33 PM  Result Value Ref Range Status   Fungus Stain Final report  Final    Comment: (NOTE) Performed At: [facility] [address] [place], [place] [id number] Jolene Schimke MD WU:[id number]    Fungus (Mycology) Culture PENDING  Incomplete  Fungal Source CSF  Final  Culture, fungus without smear     Status: Abnormal (Preliminary result)   Collection Time: 11/27/17  3:33 PM  Result Value Ref Range Status   Specimen Description CSF  Final   Special Requests PENDING  Incomplete   Culture CRYPTOCOCCUS NEOFORMANS (A)  Final   Report Status PENDING  Incomplete  Fungus Culture Result     Status: None   Collection Time: 11/27/17  3:33 PM  Result Value Ref Range Status   Result 1 Comment  Final    Comment: (NOTE) Fungal elements, such as arthroconidia, hyphal fragments, chlamydoconidia, observed. Performed At: [facility] [address] [place], [place] [id number] Jolene SchimkeNagendra Sanjai MD UX:[id number]:[phone]          Radiology Studies: No results found.      Scheduled Meds: . carvedilol  3.125 mg Oral BID WC  . dextrose  10 mL Intravenous Q24H  . dextrose  10 mL Intravenous Q24H  . docusate  25 mg Both EARS BID  . feeding supplement (ENSURE ENLIVE)  237 mL Oral TID BM  . flucytosine  25 mg/kg Oral Q6H  . lisinopril  5 mg Oral Daily  . multivitamin with minerals  1 tablet Oral Daily  . sodium chloride  500 mL Intravenous Q24H  . sodium chloride  500 mL Intravenous Q24H  . sodium chloride flush  3 mL Intravenous Q12H  . sulfamethoxazole-trimethoprim  1 tablet Oral Daily   Continuous Infusions: . sodium chloride 75 mL/hr at 12/01/17 0534  . amphotericin  B  Liposome (AMBISOME) ADULT IV Stopped (11/30/17 2208)     LOS: 4 days    Time spent: 35 minutes    Ramiro Harvestaniel Teleshia Lemere, MD [facility] Pager [phone]  If 7PM-7AM, please contact night-coverage [email or web address] Password [email or web address] 12/01/2017, 5:10 PM

## 2017-12-02 LAB — CBC
HEMATOCRIT: 28.5 % — AB (ref 39.0–52.0)
HEMOGLOBIN: 9.5 g/dL — AB (ref 13.0–17.0)
MCH: 27.3 pg (ref 26.0–34.0)
MCHC: 33.3 g/dL (ref 30.0–36.0)
MCV: 81.9 fL (ref 78.0–100.0)
Platelets: 116 10*3/uL — ABNORMAL LOW (ref 150–400)
RBC: 3.48 MIL/uL — AB (ref 4.22–5.81)
RDW: 13.7 % (ref 11.5–15.5)
WBC: 2.2 10*3/uL — AB (ref 4.0–10.5)

## 2017-12-02 LAB — URINE CULTURE: Culture: NO GROWTH

## 2017-12-02 LAB — OSMOLALITY: OSMOLALITY: 265 mosm/kg — AB (ref 275–295)

## 2017-12-02 LAB — BASIC METABOLIC PANEL
ANION GAP: 4 — AB (ref 5–15)
BUN: 12 mg/dL (ref 6–20)
CALCIUM: 7.9 mg/dL — AB (ref 8.9–10.3)
CO2: 23 mmol/L (ref 22–32)
Chloride: 97 mmol/L — ABNORMAL LOW (ref 101–111)
Creatinine, Ser: 0.62 mg/dL (ref 0.61–1.24)
GLUCOSE: 87 mg/dL (ref 65–99)
POTASSIUM: 4.2 mmol/L (ref 3.5–5.1)
Sodium: 124 mmol/L — ABNORMAL LOW (ref 135–145)

## 2017-12-02 LAB — MAGNESIUM: MAGNESIUM: 1.7 mg/dL (ref 1.7–2.4)

## 2017-12-02 LAB — OSMOLALITY, URINE: OSMOLALITY UR: 214 mosm/kg — AB (ref 300–900)

## 2017-12-02 LAB — SODIUM, URINE, RANDOM: SODIUM UR: 72 mmol/L

## 2017-12-02 MED ORDER — MAGNESIUM SULFATE 4 GM/100ML IV SOLN
4.0000 g | Freq: Once | INTRAVENOUS | Status: AC
  Administered 2017-12-02: 4 g via INTRAVENOUS
  Filled 2017-12-02: qty 100

## 2017-12-02 MED ORDER — FUROSEMIDE 10 MG/ML IJ SOLN
40.0000 mg | Freq: Two times a day (BID) | INTRAMUSCULAR | Status: DC
  Administered 2017-12-02 – 2017-12-04 (×4): 40 mg via INTRAVENOUS
  Filled 2017-12-02 (×3): qty 4

## 2017-12-02 MED ORDER — POTASSIUM CHLORIDE CRYS ER 20 MEQ PO TBCR
20.0000 meq | EXTENDED_RELEASE_TABLET | Freq: Every day | ORAL | Status: DC
  Administered 2017-12-02 – 2017-12-03 (×2): 20 meq via ORAL
  Filled 2017-12-02 (×2): qty 1

## 2017-12-02 MED ORDER — ENOXAPARIN SODIUM 40 MG/0.4ML ~~LOC~~ SOLN
40.0000 mg | SUBCUTANEOUS | Status: DC
  Administered 2017-12-02: 40 mg via SUBCUTANEOUS
  Filled 2017-12-02: qty 0.4

## 2017-12-02 NOTE — Progress Notes (Signed)
Pt sister called concerned about discharge plans and states the pt current living condition at a boarding  house is very poor and sub standard. Sister states pt need to be placed in a facility because he is unable to care for himself. Pt continues to be inct of bladder and bowel at times. Sister states the pt lives in a boarding house that is infested with bugs and poor living condition. Will Update Social Worker to review. SRP, RN

## 2017-12-02 NOTE — Progress Notes (Signed)
PROGRESS NOTE        PATIENT DETAILS Name: Kenneth Rivas Age: 57 y.o. Sex: male Date of Birth: [birth year] Admit Date: 11/26/2017 Admitting Physician Haydee Salter, MD PCP:Van Dam, Lisette Grinder, MD  Brief Narrative: Patient is a 57 y.o. male with prior history of HIV/AIDS-last CD4 count less than 10, history of cryptococcal meningitis in 2018 (not very compliant with antifungals) admitted with generalized weakness, near syncopal event. Patient was noted to have a prolonged QTC, and was also found to have recurrent/persistent cryptococcal meningitis and admitted to the hospitalist service. See below for further details  Subjective: Hard of hearing-but denies any nausea, vomiting, diarrhea, headache or abdominal pain. No fever.  Assessment/Plan: Persistent cryptococcal meningitis: Continue reinduction with amphotericin and flucytosine. Await further recommendations from infectious disease.  HIV/AIDS: Per infectious disease note-this is poorly controlled and advanced-ART has been deferred-as he is getting a redo induction for cryptococcal meningitis. He remains on Bactrim for PCP prophylaxis, given prolonged QTC- avoid azithromycin for MAI prophylaxis  Near syncope: Probably secondary to dehydration/orthostatic mechanism. 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 Cardiology consulted-with no further recommendations at this time.  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  QT prolongation: Resolved-avoid QT prolonging agents as much as possible.  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  Severe protein calorie malnutrition: Continue supplements.  Hypertension: Continue Coreg and ACE inhibitor-adding Lasix. See above.  Anemia/leukopenia: Likely secondary to advanced HIV-stable for now.  DVT Prophylaxis: Prophylactic Lovenox   Code Status: Full code  Family Communication: None at bedside  Disposition Plan: Remain inpatient  Antimicrobial agents: Anti-infectives (From admission, onward)   Start     Dose/Rate Route Frequency Ordered Stop   11/27/17 1800  flucytosine (ANCOBON) capsule 1,250 mg     25 mg/kg  47 kg Oral Every 6 hours 11/27/17 1615     11/27/17 1730  amphotericin B liposome (AMBISOME) 240 mg in dextrose 5 % 500 mL IVPB     5 mg/kg  47 kg 250 mL/hr over 120 Minutes Intravenous Every 24 hours 11/27/17 1615     11/27/17 1000  sulfamethoxazole-trimethoprim (BACTRIM DS,SEPTRA DS) 800-160 MG per tablet 1 tablet     1 tablet Oral Daily 11/27/17 0839        Procedures: 1/11>> fluoroscopy guided lumbar puncture-opening pressure of 36 cm of water.  CONSULTS:  ID  Time spent: 25 minutes-Greater than 50% of this time was spent in counseling, explanation of diagnosis, planning of further management, and coordination of care.  MEDICATIONS: Scheduled Meds: . carvedilol  3.125 mg Oral BID WC  . dextrose  10 mL Intravenous Q24H  . dextrose  10 mL Intravenous Q24H  . docusate  25 mg Both EARS BID  . feeding supplement (ENSURE ENLIVE)  237 mL Oral TID BM  . flucytosine  25 mg/kg Oral Q6H  . lisinopril  5 mg Oral Daily  . multivitamin with minerals  1 tablet Oral Daily  . sodium chloride  500 mL Intravenous Q24H  . sodium chloride  500 mL Intravenous  Q24H  . sodium chloride flush  3 mL Intravenous Q12H  . sulfamethoxazole-trimethoprim  1 tablet Oral Daily   Continuous Infusions: . amphotericin  B  Liposome (AMBISOME) ADULT IV Stopped (12/01/17  2238)   PRN Meds:.acetaminophen **OR** acetaminophen, hydrALAZINE, meperidine (DEMEROL) injection   PHYSICAL EXAM: Vital signs: Vitals:   12/01/17 2013 12/01/17 2237 12/02/17 0250 12/02/17 0357  BP: 131/86  139/86   Pulse: 85  85   Resp: 18  20   Temp: 99.9 F (37.7 C) 98.7 F (37.1 C) 98.6 F (37 C)   TempSrc: Oral Oral Oral   SpO2: 99%  100%   Weight:    48.9 kg (107 lb 12.9 oz)  Height:       Filed Weights   11/30/17 0500 12/01/17 0500 12/02/17 0357  Weight: 49.4 kg (108 lb 14.5 oz) 49.2 kg (108 lb 7.5 oz) 48.9 kg (107 lb 12.9 oz)   Body mass index is 17.4 kg/m.   General appearance :Awake, alert, not in any distress. Chronically sick appearing Eyes:, pupils equally reactive to light and accomodation,no scleral icterus. HEENT: Atraumatic and Normocephalic Neck: supple, no JVD. No cervical lymphadenopathy Resp:Good air entry bilaterally, no added sounds  CVS: S1 S2 regular, no murmurs.  GI: Bowel sounds present, Non tender and not distended with no gaurding, rigidity or rebound.No organomegaly Extremities: B/L Lower Ext shows no edema, both legs are warm to touch Neurology: Generally weak but no focal deficits Musculoskeletal:No digital cyanosis Skin:No Rash, warm and dry Wounds:N/A  I have personally reviewed following labs and imaging studies  LABORATORY DATA: CBC: Recent Labs  Lab 11/26/17 1630  11/28/17 0429 11/29/17 0528 11/30/17 0447 12/01/17 0522 12/02/17 0421  WBC 2.6*   < > 3.3* 2.3* 1.8* 2.4* 2.2*  NEUTROABS 2.0  --  2.6 1.7 1.0* 1.5*  --   HGB 13.6   < > 9.5* 9.8* 9.9* 9.7* 9.5*  HCT 40.0   < > 29.2* 29.3* 29.2* 29.3* 28.5*  MCV 82.3   < > 83.2 82.5 82.5 82.8 81.9  PLT 100*   < > 73* 76* 78* 104* 116*   < > = values in this interval not displayed.    Basic Metabolic Panel: Recent Labs  Lab 11/26/17 1630  11/28/17 0429 11/29/17 0528 11/30/17 0447 12/01/17 0522 12/02/17 0421  NA 130*   < > 130* 131* 125* 129* 124*  K 3.1*   < > 3.4*  3.5 3.9 4.4 4.2  CL 89*   < > 99* 101 96* 98* 97*  CO2 28   < > 26 25 23 25 23   GLUCOSE 109*   < > 101* 97 91 91 87  BUN 12   < > 15 11 12 14 12   CREATININE 0.79   < > 0.79 0.68 0.65 0.65 0.62  CALCIUM 8.8*   < > 7.8* 8.0* 7.9* 8.0* 7.9*  MG 1.7   < > 1.9 1.7 1.9 1.6* 1.7  PHOS 3.9  --   --   --   --   --   --    < > = values in this interval not displayed.    GFR: Estimated Creatinine Clearance: 69.6 mL/min (by C-G formula based on SCr of 0.62 mg/dL).  Liver Function Tests: Recent Labs  Lab 11/26/17 1630  AST 38  ALT 78*  ALKPHOS 133*  BILITOT 1.0  PROT 8.6*  ALBUMIN 3.4*   No results for input(s): LIPASE, AMYLASE in the last 168 hours. No results for  input(s): AMMONIA in the last 168 hours.  Coagulation Profile: Recent Labs  Lab 11/26/17 1700  INR 0.95    Cardiac Enzymes: Recent Labs  Lab 11/27/17 0853 11/27/17 1608 11/27/17 2205  TROPONINI 0.03* <0.03 <0.03    BNP (last 3 results) No results for input(s): PROBNP in the last 8760 hours.  HbA1C: No results for input(s): HGBA1C in the last 72 hours.  CBG: No results for input(s): GLUCAP in the last 168 hours.  Lipid Profile: No results for input(s): CHOL, HDL, LDLCALC, TRIG, CHOLHDL, LDLDIRECT in the last 72 hours.  Thyroid Function Tests: Recent Labs    11/29/17 1438  TSH 1.209    Anemia Panel: No results for input(s): VITAMINB12, FOLATE, FERRITIN, TIBC, IRON, RETICCTPCT in the last 72 hours.  Urine analysis:    Component Value Date/Time   COLORURINE YELLOW 11/30/2017 1150   APPEARANCEUR CLEAR 11/30/2017 1150   LABSPEC 1.009 11/30/2017 1150   PHURINE 8.0 11/30/2017 1150   GLUCOSEU 50 (A) 11/30/2017 1150   HGBUR NEGATIVE 11/30/2017 1150   BILIRUBINUR NEGATIVE 11/30/2017 1150   KETONESUR NEGATIVE 11/30/2017 1150   PROTEINUR NEGATIVE 11/30/2017 1150   NITRITE NEGATIVE 11/30/2017 1150   LEUKOCYTESUR NEGATIVE 11/30/2017 1150    Sepsis Labs: Lactic Acid, Venous    Component Value  Date/Time   LATICACIDVEN 2.03 (HH) 06/16/2017 1924    MICROBIOLOGY: Recent Results (from the past 240 hour(s))  CSF culture     Status: None   Collection Time: 11/27/17  3:33 PM  Result Value Ref Range Status   Specimen Description CSF  Final   Special Requests NONE  Final   Gram Stain   Final    WBC PRESENT, PREDOMINANTLY MONONUCLEAR YEAST PRESENT CYTOSPIN SMEAR CRITICAL RESULT CALLED TO, READ BACK BY AND VERIFIED WITH: R.CORCORAN AT 1752 ON 11/27/17 BY N.THOMPSON    Culture ABUNDANT CRYPTOCOCCUS NEOFORMANS  Final   Report Status 11/30/2017 FINAL  Final  Fungus Culture With Stain     Status: None   Collection Time: 11/27/17  3:33 PM  Result Value Ref Range Status   Fungus Stain Final report  Final   Fungus (Mycology) Culture Preliminary report  Final    Comment: (NOTE) Performed At: [facility] [address] [place], [place] [id number] Jolene SchimkeNagendra Sanjai MD BJ:[id number]:[phone]    Fungal Source CSF  Final  Culture, fungus without smear     Status: Abnormal (Preliminary result)   Collection Time: 11/27/17  3:33 PM  Result Value Ref Range Status   Specimen Description CSF  Final   Special Requests PENDING  Incomplete   Culture CRYPTOCOCCUS NEOFORMANS (A)  Final   Report Status PENDING  Incomplete  Fungus Culture Result     Status: None   Collection Time: 11/27/17  3:33 PM  Result Value Ref Range Status   Result 1 Comment  Final    Comment: (NOTE) Fungal elements, such as arthroconidia, hyphal fragments, chlamydoconidia, observed. Performed At: [facility] [address] [place], [place] [id number] Jolene SchimkeNagendra Sanjai MD GE:[id number]:[phone]   Fungal organism reflex     Status: None   Collection Time: 11/27/17  3:33 PM  Result Value Ref Range Status   Fungal result 1 Comment  Final    Comment: (NOTE) Yeast isolated, identification in progress. NOTIFIED/FAXED ACE Q. AT 0837 ON [id number].(BL) FAX [phone] Performed At: [facility] [address]  [place], [place] [id number] Jolene SchimkeNagendra Sanjai MD [id number]:[phone]   Culture, Urine     Status: None   Collection Time: 11/30/17 11:50  AM  Result Value Ref Range Status   Specimen Description URINE, CLEAN CATCH  Final   Special Requests NONE  Final   Culture   Final    NO GROWTH Performed at [facility], [address], [place], [place] [address]    Report Status 12/02/2017 FINAL  Final    RADIOLOGY STUDIES/RESULTS: Dg Chest 2 View  Result Date: 11/26/2017 CLINICAL DATA:  Weakness EXAM: CHEST  2 VIEW COMPARISON:  07/11/2017 chest radiograph. FINDINGS: Stable cardiomediastinal silhouette with normal heart size. No pneumothorax. No pleural effusion. Lungs appear clear, with no acute consolidative airspace disease and no pulmonary edema. IMPRESSION: No active cardiopulmonary disease. Electronically Signed   By: Delbert Phenix M.D.   On: 11/26/2017 17:27   Ct Head Wo Contrast  Result Date: 11/26/2017 CLINICAL DATA:  Syncopal episodes, generalized weakness, focal neural deficit of greater than 6 hours, history of HIV, cryptococcal meningitis, hypertension EXAM: CT HEAD WITHOUT CONTRAST TECHNIQUE: Contiguous axial images were obtained from the base of the skull through the vertex without intravenous contrast. Sagittal and coronal MPR images reconstructed from axial data set. COMPARISON:  06/24/2017 FINDINGS: Brain: Exam degraded by patient motion. Generalized atrophy. Normal ventricular morphology. No midline shift or mass effect. Scattered areas of white matter hypoattenuation particularly frontal regions. No intracranial hemorrhage, mass lesion, or evidence acute infarction. No extra-axial fluid collections. Vascular: Unremarkable Skull: Unremarkable Sinuses/Orbits: Clear Other: N/A IMPRESSION: White matter hypoattenuation in the frontal regions greater on RIGHT similar prior exam. No new intracranial abnormalities. Electronically Signed   By: Ulyses Southward M.D.   On: 11/26/2017 17:05   Dg Fluoro Guide  Lumbar Puncture  Result Date: 11/27/2017 CLINICAL DATA:  AIDS.  Evaluate for cryptococcal meningitis. EXAM: DIAGNOSTIC LUMBAR PUNCTURE UNDER FLUOROSCOPIC GUIDANCE FLUOROSCOPY TIME:  Fluoroscopy Time:  1 minute 14 seconds Radiation Exposure Index (if provided by the fluoroscopic device): 10 mGy Number of Acquired Spot Images: 1 PROCEDURE: Informed consent was obtained from the patient prior to the procedure, including potential complications of headache, allergy, and pain. With the patient prone, the lower back was prepped with Betadine. 1% Lidocaine was used for local anesthesia. Lumbar puncture was performed at the L2-3 level using a 20 gauge needle with return of clear CSF with an opening pressure of 36 cm water. 9.5 ml of CSF were obtained for laboratory studies. The patient tolerated the procedure well and there were no apparent complications. IMPRESSION: 1. Successful lumbar puncture at L2-3. 2. Opening pressure of 36 cm water.  9.5 cc of CSF collected. Electronically Signed   By: Signa Kell M.D.   On: 11/27/2017 15:43     LOS: 5 days   Jeoffrey Massed, MD  [facility] Pager:[phone]  If 7PM-7AM, please contact night-coverage [email or web address] Password [email or web address] 12/02/2017, 12:30 PM

## 2017-12-02 NOTE — Progress Notes (Signed)
PHARMACY BRIEF NOTE:   ELECTROLYTE REPLACEMENT PER LIPSOMAL AMPHOTERICIN B PROTOCOL  Ambisome 240 mg (5 mg/kg) IV q24h in combination with flucytosine 1250 mg (25 mg/kg) PO q6h  for cryptococcal meningitis.   ID service was consulted and is actively following.  Recent Labs    12/01/17 0522 12/02/17 0421  NA 129* 124*  K 4.4 4.2  CL 98* 97*  CO2 25 23  GLUCOSE 91 87  BUN 14 12  CREATININE 0.65 0.62  CALCIUM 8.0* 7.9*  MG 1.6* 1.7    Assessment: K 4.2 after 20 PO kdur 1/14, goal > 4 Mag 1.7 after 4 gm IV bolus yesterday, goal > 2   Plan, per Amphotericin B Protocol: Mag Sulfate 4 grams IV x 1 this AM Continue NS 500 mL bolus IV before and after each infusion of Ambisome Continue daily BMet, Mg  Kenneth Rivas, PharmD, BCPS Pager: [phone] 12/02/2017 7:01 AM

## 2017-12-03 LAB — BASIC METABOLIC PANEL
ANION GAP: 5 (ref 5–15)
BUN: 19 mg/dL (ref 6–20)
CALCIUM: 8.3 mg/dL — AB (ref 8.9–10.3)
CHLORIDE: 98 mmol/L — AB (ref 101–111)
CO2: 23 mmol/L (ref 22–32)
Creatinine, Ser: 0.78 mg/dL (ref 0.61–1.24)
GFR calc non Af Amer: >60 mL/min (ref >60)
GLUCOSE: 90 mg/dL (ref 65–99)
Potassium: 4.5 mmol/L (ref 3.5–5.1)
Sodium: 126 mmol/L — ABNORMAL LOW (ref 135–145)

## 2017-12-03 LAB — MAGNESIUM: Magnesium: 2.1 mg/dL (ref 1.7–2.4)

## 2017-12-03 MED ORDER — ENOXAPARIN SODIUM 40 MG/0.4ML ~~LOC~~ SOLN
40.0000 mg | SUBCUTANEOUS | Status: DC
  Administered 2017-12-05 – 2017-12-09 (×5): 40 mg via SUBCUTANEOUS
  Filled 2017-12-03 (×5): qty 0.4

## 2017-12-03 NOTE — Progress Notes (Signed)
Occupational Therapy Treatment Patient Details Name: Kenneth Rivas MRN: [id number] DOB: Sep 01, 1961 Today's Date: 12/03/2017    History of present illness 57 yo male admitted with weakness, near syncopal episode.  Dx:  cryptococcal meningoencephalitis.  Hx of HIV/AIDS, Hep B, drug abuse, noncompliance, cachexia, anemia.    OT comments  Performed ADL with a lot of assistance.  Pt participated in grooming and limited UB adls but participation waned over course of tx.  MAX encouragement to participate and especially to stand briefly  Follow Up Recommendations  SNF    Equipment Recommendations  (? 3:1 commode)    Recommendations for Other Services      Precautions / Restrictions Precautions Precautions: Fall Restrictions Weight Bearing Restrictions: No       Mobility Bed Mobility         Supine to sit: Max assist Sit to supine: Min guard   General bed mobility comments: decreased initiation to sit up; got himself back into bed and assist to watch lines; guard him for safety  Transfers Overall transfer level: Needs assistance Equipment used: 2 person hand held assist Transfers: Sit to/from Stand Sit to Stand: Max assist;+2 safety/equipment         General transfer comment: ? if pt put forth full effort. He was able to stand once assisted up.  Therapist shifted hips towards HOB prior to sitting    Balance                                           ADL either performed or assessed with clinical judgement   ADL       Grooming: Wash/dry hands;Wash/dry face;Set up;Sitting   Upper Body Bathing: Maximal assistance;Sitting   Lower Body Bathing: Total assistance;Bed level   Upper Body Dressing : Maximal assistance                     General ADL Comments: Stood to change sheets with +2 assistance.  Completed LB bathing from bed level.  Pt participated with max encouragement.  When OT held rinsing cloth, pt moved face to cloth rather than  taking it.  Participation waned over course of tx pt became less thorough     Vision       Perception     Praxis      Cognition Arousal/Alertness: Awake/alert Behavior During Therapy: WFL for tasks assessed/performed Overall Cognitive Status: No family/caregiver present to determine baseline cognitive functioning                                 General Comments: very HOH; resistant to getting up and standing but complied with MAX encouragement        Exercises     Shoulder Instructions       General Comments      Pertinent Vitals/ Pain       Pain Assessment: No/denies pain  Home Living                                          Prior Functioning/Environment              Frequency           Progress Toward Goals  OT Goals(current goals can now be found in the care plan section)  Progress towards OT goals: Progressing toward goals(slowly)     Plan      Co-evaluation                 AM-PAC PT "6 Clicks" Daily Activity     Outcome Measure   Help from another person eating meals?: A Little Help from another person taking care of personal grooming?: A Little Help from another person toileting, which includes using toliet, bedpan, or urinal?: A Lot Help from another person bathing (including washing, rinsing, drying)?: A Lot Help from another person to put on and taking off regular upper body clothing?: A Lot Help from another person to put on and taking off regular lower body clothing?: Total 6 Click Score: 13    End of Session        Activity Tolerance Patient limited by fatigue   Patient Left in bed;with call bell/phone within reach;with bed alarm set   Nurse Communication          Time: [id number] OT Time Calculation (min): 18 min  Charges: OT General Charges $OT Visit: 1 Visit OT Treatments $Self Care/Home Management : 8-22 mins  Marica OtterMaryellen Haile Toppins,  OTR/L [phone] 12/03/2017   Issak Goley 12/03/2017, 8:24 AM

## 2017-12-03 NOTE — Progress Notes (Signed)
Spoke with Ebony Haileresa Brown sister [phone]) and states she will be available to provide consent for patient tentatively LP planned for 1/18. SRP, RN

## 2017-12-03 NOTE — Progress Notes (Signed)
PROGRESS NOTE        PATIENT DETAILS Name: Kenneth Rivas Age: 57 y.o. Sex: male Date of Birth: [birth year] Admit Date: 11/26/2017 Admitting Physician Haydee Salter, MD PCP:Van Dam, Lisette Grinder, MD  Brief Narrative: Patient is a 57 y.o. male with prior history of HIV/AIDS-last CD4 count less than 10, history of cryptococcal meningitis in 2018 (not very compliant with antifungals) admitted with generalized weakness, near syncopal event. Patient was noted to have a prolonged QTC, and was also found to have recurrent/persistent cryptococcal meningitis and admitted to the hospitalist service. See below for further details  Subjective: Lying comfortably in bed-denies headache.  Denies nausea or vomiting.  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  Denies any headache.  Await further recommendations from infectious disease  HIV/AIDS: Per infectious disease-this is very poorly controlled-ART's has been deferred in the setting of a redo induction for cryptococcal meningitis.  He remains on Bactrim for PCP prophylaxis, given prolonged QTC-he remains off Zithromax for MAI prophylaxis.  ID following.    Near syncope: Probably secondary to dehydration/orthostatic mechanism. 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 Cardiology consulted-with no further recommendations at this time.  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  QT prolongation: Resolved-avoid QT prolonging agents as much as possible.  Hyponatremia: Somewhat improved-he appears euvolemic-he could have SIADH pathophysiology due to ongoing meningitis.  Continue Lasix 40 mg twice daily, fluid restriction-and continue to follow electrolytes.  He does get  approximately 1 L of IV fluids with amphotericin.  Severe protein calorie malnutrition: Continue supplements  Hypertension: Controlled with Coreg, lisinopril and Lasix.  Follow and adjust accordingly.  Anemia/leukopenia: Likely secondary to advanced HIV-stable for now.  DVT Prophylaxis: Prophylactic Lovenox   Code Status: Full code  Family Communication: None at bedside  Disposition Plan: Remain inpatient  Antimicrobial agents: Anti-infectives (From admission, onward)   Start     Dose/Rate Route Frequency Ordered Stop   11/27/17 1800  flucytosine (ANCOBON) capsule 1,250 mg     25 mg/kg  47 kg Oral Every 6 hours 11/27/17 1615     11/27/17 1730  amphotericin B liposome (AMBISOME) 240 mg in dextrose 5 % 500 mL IVPB     5 mg/kg  47 kg 250 mL/hr over 120 Minutes Intravenous Every 24 hours 11/27/17 1615     11/27/17 1000  sulfamethoxazole-trimethoprim (BACTRIM DS,SEPTRA DS) 800-160 MG per tablet 1 tablet     1 tablet Oral Daily 11/27/17 0839        Procedures: 1/11>> fluoroscopy guided lumbar puncture-opening pressure of 36 cm of water.  CONSULTS:  ID  Time spent: 25 minutes-Greater than 50% of this time was spent in counseling, explanation of diagnosis, planning of further management, and coordination of care.  MEDICATIONS: Scheduled Meds: . carvedilol  3.125 mg Oral BID WC  . dextrose  10 mL Intravenous Q24H  . dextrose  10 mL Intravenous Q24H  . docusate  25 mg Both EARS BID  . enoxaparin (LOVENOX) injection  40 mg Subcutaneous Q24H  . feeding supplement (ENSURE ENLIVE)  237 mL Oral TID BM  . flucytosine  25 mg/kg Oral Q6H  . furosemide  40 mg Intravenous BID  . lisinopril  5 mg Oral Daily  .  multivitamin with minerals  1 tablet Oral Daily  . potassium chloride  20 mEq Oral Daily  . sodium chloride  500 mL Intravenous Q24H  . sodium chloride  500 mL Intravenous Q24H  . sodium chloride flush  3 mL Intravenous Q12H  . sulfamethoxazole-trimethoprim  1 tablet Oral  Daily   Continuous Infusions: . amphotericin  B  Liposome (AMBISOME) ADULT IV Stopped (12/02/17 1921)   PRN Meds:.acetaminophen **OR** acetaminophen, hydrALAZINE, meperidine (DEMEROL) injection   PHYSICAL EXAM: Vital signs: Vitals:   12/02/17 1417 12/02/17 2109 12/03/17 0335 12/03/17 0900  BP: 118/80 123/80 136/82 130/88  Pulse: 80 90 86 86  Resp: 18     Temp: 98.7 F (37.1 C) 98.4 F (36.9 C) 98.9 F (37.2 C) 98.4 F (36.9 C)  TempSrc: Oral Oral Oral Oral  SpO2: 100% 100% 100% 100%  Weight:      Height:       Filed Weights   11/30/17 0500 12/01/17 0500 12/02/17 0357  Weight: 49.4 kg (108 lb 14.5 oz) 49.2 kg (108 lb 7.5 oz) 48.9 kg (107 lb 12.9 oz)   Body mass index is 17.4 kg/m.   General appearance :Awake, alert, not in any distress.  Eyes:, pupils equally reactive to light and accomodation,no scleral icterus. HEENT: Atraumatic and Normocephalic Neck: supple, no JVD. Resp:Good air entry bilaterally, no rales or rhonchi CVS: S1 S2 regular, no murmurs.  GI: Bowel sounds present, Non tender and not distended with no gaurding, rigidity or rebound. Extremities: B/L Lower Ext shows no edema, both legs are warm to touch Neurology:  speech clear,Non focal, sensation is grossly intact. Psychiatric: Normal judgment and insight. Normal mood. Musculoskeletal:No digital cyanosis Skin:No Rash, warm and dry Wounds:N/A  I have personally reviewed following labs and imaging studies  LABORATORY DATA: CBC: Recent Labs  Lab 11/26/17 1630  11/28/17 0429 11/29/17 0528 11/30/17 0447 12/01/17 0522 12/02/17 0421  WBC 2.6*   < > 3.3* 2.3* 1.8* 2.4* 2.2*  NEUTROABS 2.0  --  2.6 1.7 1.0* 1.5*  --   HGB 13.6   < > 9.5* 9.8* 9.9* 9.7* 9.5*  HCT 40.0   < > 29.2* 29.3* 29.2* 29.3* 28.5*  MCV 82.3   < > 83.2 82.5 82.5 82.8 81.9  PLT 100*   < > 73* 76* 78* 104* 116*   < > = values in this interval not displayed.    Basic Metabolic Panel: Recent Labs  Lab 11/26/17 1630   11/29/17 0528 11/30/17 0447 12/01/17 0522 12/02/17 0421 12/03/17 0429  NA 130*   < > 131* 125* 129* 124* 126*  K 3.1*   < > 3.5 3.9 4.4 4.2 4.5  CL 89*   < > 101 96* 98* 97* 98*  CO2 28   < > 25 23 25 23 23   GLUCOSE 109*   < > 97 91 91 87 90  BUN 12   < > 11 12 14 12 19   CREATININE 0.79   < > 0.68 0.65 0.65 0.62 0.78  CALCIUM 8.8*   < > 8.0* 7.9* 8.0* 7.9* 8.3*  MG 1.7   < > 1.7 1.9 1.6* 1.7 2.1  PHOS 3.9  --   --   --   --   --   --    < > = values in this interval not displayed.    GFR: Estimated Creatinine Clearance: 69.6 mL/min (by C-G formula based on SCr of 0.78 mg/dL).  Liver Function Tests: Recent Labs  Lab 11/26/17  1630  AST 38  ALT 78*  ALKPHOS 133*  BILITOT 1.0  PROT 8.6*  ALBUMIN 3.4*   No results for input(s): LIPASE, AMYLASE in the last 168 hours. No results for input(s): AMMONIA in the last 168 hours.  Coagulation Profile: Recent Labs  Lab 11/26/17 1700  INR 0.95    Cardiac Enzymes: Recent Labs  Lab 11/27/17 0853 11/27/17 1608 11/27/17 2205  TROPONINI 0.03* <0.03 <0.03    BNP (last 3 results) No results for input(s): PROBNP in the last 8760 hours.  HbA1C: No results for input(s): HGBA1C in the last 72 hours.  CBG: No results for input(s): GLUCAP in the last 168 hours.  Lipid Profile: No results for input(s): CHOL, HDL, LDLCALC, TRIG, CHOLHDL, LDLDIRECT in the last 72 hours.  Thyroid Function Tests: No results for input(s): TSH, T4TOTAL, FREET4, T3FREE, THYROIDAB in the last 72 hours.  Anemia Panel: No results for input(s): VITAMINB12, FOLATE, FERRITIN, TIBC, IRON, RETICCTPCT in the last 72 hours.  Urine analysis:    Component Value Date/Time   COLORURINE YELLOW 11/30/2017 1150   APPEARANCEUR CLEAR 11/30/2017 1150   LABSPEC 1.009 11/30/2017 1150   PHURINE 8.0 11/30/2017 1150   GLUCOSEU 50 (A) 11/30/2017 1150   HGBUR NEGATIVE 11/30/2017 1150   BILIRUBINUR NEGATIVE 11/30/2017 1150   KETONESUR NEGATIVE 11/30/2017 1150    PROTEINUR NEGATIVE 11/30/2017 1150   NITRITE NEGATIVE 11/30/2017 1150   LEUKOCYTESUR NEGATIVE 11/30/2017 1150    Sepsis Labs: Lactic Acid, Venous    Component Value Date/Time   LATICACIDVEN 2.03 (HH) 06/16/2017 1924    MICROBIOLOGY: Recent Results (from the past 240 hour(s))  CSF culture     Status: None   Collection Time: 11/27/17  3:33 PM  Result Value Ref Range Status   Specimen Description CSF  Final   Special Requests NONE  Final   Gram Stain   Final    WBC PRESENT, PREDOMINANTLY MONONUCLEAR YEAST PRESENT CYTOSPIN SMEAR CRITICAL RESULT CALLED TO, READ BACK BY AND VERIFIED WITH: R.CORCORAN AT 1752 ON 11/27/17 BY N.THOMPSON    Culture ABUNDANT CRYPTOCOCCUS NEOFORMANS  Final   Report Status 11/30/2017 FINAL  Final  Fungus Culture With Stain     Status: None   Collection Time: 11/27/17  3:33 PM  Result Value Ref Range Status   Fungus Stain Final report  Final   Fungus (Mycology) Culture Preliminary report  Final    Comment: (NOTE) Performed At: [facility] [address] [place], [place] [id number] Jolene Schimke MD WU:[id number]    Fungal Source CSF  Final  Culture, fungus without smear     Status: Abnormal (Preliminary result)   Collection Time: 11/27/17  3:33 PM  Result Value Ref Range Status   Specimen Description CSF  Final   Special Requests PENDING  Incomplete   Culture CRYPTOCOCCUS NEOFORMANS (A)  Final   Report Status PENDING  Incomplete  Fungus Culture Result     Status: None   Collection Time: 11/27/17  3:33 PM  Result Value Ref Range Status   Result 1 Comment  Final    Comment: (NOTE) Fungal elements, such as arthroconidia, hyphal fragments, chlamydoconidia, observed. Performed At: [facility] [address] [place], [place] [id number] Jolene Schimke MD VH:[id number]   Fungal organism reflex     Status: None   Collection Time: 11/27/17  3:33 PM  Result Value Ref Range Status   Fungal result 1 Comment  Final    Comment:  (NOTE) Yeast isolated, identification in progress. NOTIFIED/FAXED ACE Q.  AT [id number] ON [id number].(BL) FAX [phone] Performed At: [facility] [address] [place], [place] [id number] Jolene SchimkeNagendra Sanjai MD VH:[id number]   Culture, Urine     Status: None   Collection Time: 11/30/17 11:50 AM  Result Value Ref Range Status   Specimen Description URINE, CLEAN CATCH  Final   Special Requests NONE  Final   Culture   Final    NO GROWTH Performed at [facility], [address] [place], [place] [address]    Report Status 12/02/2017 FINAL  Final    RADIOLOGY STUDIES/RESULTS: Dg Chest 2 View  Result Date: 11/26/2017 CLINICAL DATA:  Weakness EXAM: CHEST  2 VIEW COMPARISON:  07/11/2017 chest radiograph. FINDINGS: Stable cardiomediastinal silhouette with normal heart size. No pneumothorax. No pleural effusion. Lungs appear clear, with no acute consolidative airspace disease and no pulmonary edema. IMPRESSION: No active cardiopulmonary disease. Electronically Signed   By: Delbert PhenixJason A Poff M.D.   On: 11/26/2017 17:27   Ct Head Wo Contrast  Result Date: 11/26/2017 CLINICAL DATA:  Syncopal episodes, generalized weakness, focal neural deficit of greater than 6 hours, history of HIV, cryptococcal meningitis, hypertension EXAM: CT HEAD WITHOUT CONTRAST TECHNIQUE: Contiguous axial images were obtained from the base of the skull through the vertex without intravenous contrast. Sagittal and coronal MPR images reconstructed from axial data set. COMPARISON:  06/24/2017 FINDINGS: Brain: Exam degraded by patient motion. Generalized atrophy. Normal ventricular morphology. No midline shift or mass effect. Scattered areas of white matter hypoattenuation particularly frontal regions. No intracranial hemorrhage, mass lesion, or evidence acute infarction. No extra-axial fluid collections. Vascular: Unremarkable Skull: Unremarkable Sinuses/Orbits: Clear Other: N/A IMPRESSION: White matter hypoattenuation in the  frontal regions greater on RIGHT similar prior exam. No new intracranial abnormalities. Electronically Signed   By: Ulyses SouthwardMark  Boles M.D.   On: 11/26/2017 17:05   Dg Fluoro Guide Lumbar Puncture  Result Date: 11/27/2017 CLINICAL DATA:  AIDS.  Evaluate for cryptococcal meningitis. EXAM: DIAGNOSTIC LUMBAR PUNCTURE UNDER FLUOROSCOPIC GUIDANCE FLUOROSCOPY TIME:  Fluoroscopy Time:  1 minute 14 seconds Radiation Exposure Index (if provided by the fluoroscopic device): 10 mGy Number of Acquired Spot Images: 1 PROCEDURE: Informed consent was obtained from the patient prior to the procedure, including potential complications of headache, allergy, and pain. With the patient prone, the lower back was prepped with Betadine. 1% Lidocaine was used for local anesthesia. Lumbar puncture was performed at the L2-3 level using a 20 gauge needle with return of clear CSF with an opening pressure of 36 cm water. 9.5 ml of CSF were obtained for laboratory studies. The patient tolerated the procedure well and there were no apparent complications. IMPRESSION: 1. Successful lumbar puncture at L2-3. 2. Opening pressure of 36 cm water.  9.5 cc of CSF collected. Electronically Signed   By: Signa Kellaylor  Stroud M.D.   On: 11/27/2017 15:43     LOS: 6 days   Jeoffrey MassedShanker Ghimire, MD  [facility] Pager:[phone]  If 7PM-7AM, please contact night-coverage [email or web address] Password [email or web address] 12/03/2017, 10:31 AM

## 2017-12-03 NOTE — Progress Notes (Signed)
Physical Therapy Treatment Patient Details Name: Kenneth Rivas MRN: [id number] DOB: 06-07-1961 Today's Date: 12/03/2017    History of Present Illness [age] yo male admitted with weakness, near syncopal episode.  Dx:  cryptococcal meningoencephalitis.  Hx of HIV/AIDS, Hep B, drug abuse, noncompliance, cachexia, anemia.     PT Comments    Pt requires max encouragement to participate in PT session. He stood briefly with RW, refused to attempt ambulation and standing exercises, then returned to bed. He refused any further mobility.   Follow Up Recommendations  SNF     Equipment Recommendations  None recommended by PT    Recommendations for Other Services       Precautions / Restrictions Precautions Precautions: Fall Restrictions Weight Bearing Restrictions: No    Mobility  Bed Mobility         Supine to sit: Max assist Sit to supine: Min guard   General bed mobility comments: decreased initiation to sit up; got himself back into bed, pt refused to attempt to scoot up in bed;  Transfers Overall transfer level: Needs assistance Equipment used: Rolling walker (2 wheeled) Transfers: Sit to/from Stand Sit to Stand: Mod assist         General transfer comment: ? if pt put forth full effort. Pt stood briefly, ~ 15 seconds, with posterior lean with RW. He refused to attempt ambulation, refused standing exercises, stated he didn't want to stand again and returned to supine.   Ambulation/Gait                 Stairs            Wheelchair Mobility    Modified Rankin (Stroke Patients Only)       Balance   Sitting-balance support: Bilateral upper extremity supported;Feet supported Sitting balance-Leahy Scale: Fair                                      Cognition Arousal/Alertness: Awake/alert Behavior During Therapy: WFL for tasks assessed/performed Overall Cognitive Status: No family/caregiver present to determine baseline cognitive  functioning                                 General Comments: very HOH; resistant to getting up and standing but complied with MAX encouragement      Exercises      General Comments        Pertinent Vitals/Pain Pain Assessment: No/denies pain    Home Living                      Prior Function            PT Goals (current goals can now be found in the care plan section) Acute Rehab PT Goals Patient Stated Goal: none stated PT Goal Formulation: With patient Time For Goal Achievement: 12/11/17 Potential to Achieve Goals: Fair Progress towards PT goals: Not progressing toward goals - comment(pt puts forth minimal effort to mobilize)    Frequency    Min 2X/week      PT Plan Current plan remains appropriate    Co-evaluation              AM-PAC PT "6 Clicks" Daily Activity  Outcome Measure  Difficulty turning over in bed (including adjusting bedclothes, sheets and blankets)?: A Little Difficulty moving from lying on back to  sitting on the side of the bed? : Unable Difficulty sitting down on and standing up from a chair with arms (e.g., wheelchair, bedside commode, etc,.)?: Unable Help needed moving to and from a bed to chair (including a wheelchair)?: Total Help needed walking in hospital room?: Total Help needed climbing 3-5 steps with a railing? : Total 6 Click Score: 8    End of Session Equipment Utilized During Treatment: Gait belt Activity Tolerance: Other (comment)(Limited by pt's unwillingness to try to mobilize) Patient left: in bed;with call bell/phone within reach;with bed alarm set Nurse Communication: Mobility status PT Visit Diagnosis: Muscle weakness (generalized) (M62.81);Difficulty in walking, not elsewhere classified (R26.2)     Time: 4098-1191 PT Time Calculation (min) (ACUTE ONLY): 14 min  Charges:  $Therapeutic Activity: 8-22 mins                    G Codes:          Tamala Ser 12/03/2017, 1:47 PM [phone]

## 2017-12-03 NOTE — Progress Notes (Signed)
Nutrition Follow-up  DOCUMENTATION CODES:   Underweight, Severe malnutrition in context of chronic illness  INTERVENTION:   Continue Ensure Enlive po TID, each supplement provides 350 kcal and 20 grams of protein Continue Multivitamin with minerals daily  RD will continue to monitor  NUTRITION DIAGNOSIS:   Severe Malnutrition related to chronic illness(HIV with non compliance) as evidenced by percent weight loss, severe fat depletion, moderate fat depletion, severe muscle depletion.  Ongoing.  GOAL:   Patient will meet greater than or equal to 90% of their needs  Progressing.  MONITOR:   PO intake, Supplement acceptance, Weight trends, Labs  REASON FOR ASSESSMENT:   Consult Assessment of nutrition requirement/status  ASSESSMENT:   Pt with PMH significant for HIV/AIDS with poor compliance, drug abuse, HTN, and anemia. Presents this admission with near syncopal event. Admitted for prolonged QT interval and dehydration.   Pt currently consuming 50-75% of meals. Drinking Ensure supplements. Weight is now -4 lb since 1/13. Will continue supplements as ordered.  Medications: IV Lasix BID, Multivitamin with minerals daily, K-DUR tablet daily  Labs reviewed: Low Na   Diet Order:  Diet 2 gram sodium Room service appropriate? Yes; Fluid consistency: Thin; Fluid restriction: 1200 mL Fluid  EDUCATION NEEDS:   Not appropriate for education at this time  Skin:  Skin Assessment: Reviewed RN Assessment  Last BM:  1/14  Height:   Ht Readings from Last 1 Encounters:  11/27/17 5\' 6"  (1.676 m)    Weight:   Wt Readings from Last 1 Encounters:  12/02/17 107 lb 12.9 oz (48.9 kg)    Ideal Body Weight:  70 kg  BMI:  Body mass index is 17.4 kg/m.  Estimated Nutritional Needs:   Kcal:  2100-2300 kcal/day  Protein:  100-110 g/day  Fluid:  >2.1 L/day  Tilda FrancoLindsey Mykayla Brinton, MS, RD, LDN [facility] Clinical Dietitian Pager: [phone] After Hours Pager:  [phone]

## 2017-12-04 ENCOUNTER — Inpatient Hospital Stay (HOSPITAL_COMMUNITY): Payer: Medicaid Other

## 2017-12-04 LAB — CSF CELL COUNT WITH DIFFERENTIAL
Lymphs, CSF: 84 % — ABNORMAL HIGH (ref 40–80)
Monocyte-Macrophage-Spinal Fluid: 15 % (ref 15–45)
RBC Count, CSF: 2 /mm3 — ABNORMAL HIGH
Tube #: 4
WBC, CSF: 38 /mm3 (ref 0–5)

## 2017-12-04 LAB — BASIC METABOLIC PANEL
Anion gap: 5 (ref 5–15)
BUN: 24 mg/dL — AB (ref 6–20)
CO2: 25 mmol/L (ref 22–32)
Calcium: 8.5 mg/dL — ABNORMAL LOW (ref 8.9–10.3)
Chloride: 98 mmol/L — ABNORMAL LOW (ref 101–111)
Creatinine, Ser: 1.07 mg/dL (ref 0.61–1.24)
GFR calc Af Amer: >60 mL/min (ref >60)
Glucose, Bld: 95 mg/dL (ref 65–99)
POTASSIUM: 4.8 mmol/L (ref 3.5–5.1)
SODIUM: 128 mmol/L — AB (ref 135–145)

## 2017-12-04 LAB — CBC
HEMATOCRIT: 27.3 % — AB (ref 39.0–52.0)
Hemoglobin: 9.1 g/dL — ABNORMAL LOW (ref 13.0–17.0)
MCH: 27.1 pg (ref 26.0–34.0)
MCHC: 33.3 g/dL (ref 30.0–36.0)
MCV: 81.3 fL (ref 78.0–100.0)
PLATELETS: 159 10*3/uL (ref 150–400)
RBC: 3.36 MIL/uL — ABNORMAL LOW (ref 4.22–5.81)
RDW: 13.4 % (ref 11.5–15.5)
WBC: 3.5 10*3/uL — AB (ref 4.0–10.5)

## 2017-12-04 LAB — CRYPTOCOCCAL ANTIGEN, CSF
CRYPTO AG: POSITIVE — AB
Cryptococcal Ag Titer: >2560

## 2017-12-04 LAB — MAGNESIUM: MAGNESIUM: 1.8 mg/dL (ref 1.7–2.4)

## 2017-12-04 LAB — PROTEIN, CSF: TOTAL PROTEIN, CSF: 128 mg/dL — AB (ref 15–45)

## 2017-12-04 MED ORDER — LIDOCAINE HCL 1 % IJ SOLN
INTRAMUSCULAR | Status: AC
  Administered 2017-12-04: 5 mL via INTRASPINAL
  Filled 2017-12-04: qty 20

## 2017-12-04 MED ORDER — DEXTROSE 5 % IV SOLN
200.0000 mg | INTRAVENOUS | Status: DC
  Administered 2017-12-04 – 2017-12-05 (×2): 200 mg via INTRAVENOUS
  Filled 2017-12-04 (×3): qty 200

## 2017-12-04 MED ORDER — MAGNESIUM SULFATE IN D5W 1-5 GM/100ML-% IV SOLN
1.0000 g | Freq: Once | INTRAVENOUS | Status: AC
Start: 2017-12-04 — End: 2017-12-04
  Administered 2017-12-04: 1 g via INTRAVENOUS
  Filled 2017-12-04: qty 100

## 2017-12-04 MED ORDER — FUROSEMIDE 10 MG/ML IJ SOLN
40.0000 mg | Freq: Every day | INTRAMUSCULAR | Status: DC
  Administered 2017-12-05 – 2017-12-06 (×2): 40 mg via INTRAVENOUS
  Filled 2017-12-04 (×3): qty 4

## 2017-12-04 NOTE — Progress Notes (Signed)
PT Cancellation Note  Patient Details Name: Kenneth Rivas MRN: [id number] DOB: 26-Mar-1961   Cancelled Treatment:    Reason Eval/Treat Not Completed: Medical issues which prohibited therapy(pt and pt's family member state pt had lumbar puncture this morning, RN thinks he did not have LP. No procedure note in chart. Will hold PT as pt may be on bedrest 2* LP. ) RN to clarify.    Ralene BatheUhlenberg, Samaad Hashem Kistler 12/04/2017, 10:46 AM [phone]

## 2017-12-04 NOTE — [place] FL2 (Signed)
[place] MEDICAID FL2 LEVEL OF CARE SCREENING TOOL     IDENTIFICATION  Patient Name: Kenneth Rivas Birthdate: May 04, 1961 Sex: male Admission Date (Current Location): 11/26/2017  [place]  [place] [id number] O Facility and Address:  [facility],  [address], [place] [id number]      Provider Number: [id number]  Attending Physician Name and Address:  Maretta BeesGhimire, Shanker M, MD  Relative Name and Phone Number:  Clide CliffRicky [phone]    Current Level of Care: Hospital Recommended Level of Care: Skilled Nursing Facility Prior Approval Number:    Date Approved/Denied:   PASRR Number: [id number]  Discharge Plan: SNF    Current Diagnoses: Patient Active Problem List   Diagnosis Date Noted  . Abnormal echocardiogram 11/29/2017  . Cardiomyopathy [facility](HCC): Per 2 d echo 11/28/2017 11/29/2017  . Anemia 11/28/2017  . Near syncope 11/27/2017  . 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 Prolonged QT interval 11/26/2017  . Encounter for imaging study to confirm nasogastric (NG) tube placement   . Meningitis   . Blurry vision, left eye   . Pressure injury of skin 06/26/2017  . Ptosis, left eyelid   . Acute respiratory failure with hypoxia (HCC)   . Cellulitis of right hand 06/23/2017  . Cryptococcal meningoencephalitis (HCC)   . Lung abscess (HCC)   . Refeeding syndrome 06/18/2017  . Hypophosphatemia 06/18/2017  . Cryptococcosis (HCC)   . Protein-calorie malnutrition, severe 06/17/2017  . Abnormal chest x-ray   . AIDS (acquired immune deficiency syndrome) (HCC)   . Cavitary pneumonia   . Weakness 06/16/2017  . Cachexia associated with AIDS (HCC)   . Hypokalemia   . Hyponatremia   . 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 HTN (hypertension) 08/17/2013  . Hepatitis B   . ELEVATED BLOOD PRESSURE 06/12/2010  . TB SKIN TEST, POSITIVE 01/12/2009    Orientation RESPIRATION BLADDER Height & Weight     Self, Time, Situation, Place  Normal  Incontinent Weight: 107 lb 12.9 oz (48.9 kg) Height:  5\' 6"  (167.6 cm)  BEHAVIORAL SYMPTOMS/MOOD NEUROLOGICAL BOWEL NUTRITION STATUS      Incontinent Diet(2 gram sodium fluid restriction 1200mL)  AMBULATORY STATUS COMMUNICATION OF NEEDS Skin   Extensive Assist Verbally Normal                       Personal Care Assistance Level of Assistance  Bathing, Feeding, Dressing Bathing Assistance: Maximum assistance Feeding assistance: Limited assistance Dressing Assistance: Maximum assistance     Functional Limitations Info  Sight, Hearing, Speech Sight Info: Adequate Hearing Info: Impaired Speech Info: Adequate    SPECIAL CARE FACTORS FREQUENCY  PT (By licensed PT), OT (By licensed OT)     PT Frequency: 5x/week OT Frequency: 5x/week            Contractures Contractures Info: Not present    Additional Factors Info  Code Status, Allergies Code Status Info: Full code Allergies Info: NKA           Current Medications (12/04/2017):  This is the current hospital active medication list Current Facility-Administered Medications  Medication Dose Route Frequency Provider Last Rate Last Dose  . acetaminophen (TYLENOL) tablet 650 mg  650 mg Oral Q6H PRN Haydee SalterHobbs, Phillip M, MD   650 mg at 11/30/17 1112   Or  . acetaminophen (TYLENOL) suppository 650 mg  650 mg Rectal Q6H PRN Haydee SalterHobbs, Phillip M, MD      . amphotericin B liposome (AMBISOME) 200 mg in dextrose 5 % 500 mL  IVPB  200 mg Intravenous Q24H Judyann Munson, MD      . carvedilol (COREG) tablet 3.125 mg  3.125 mg Oral BID WC Rodolph Bong, MD   3.125 mg at 12/04/17 0830  . dextrose 5 % 10 mL  10 mL Intravenous Q24H Judyann Munson, MD   10 mL at 12/03/17 1748  . dextrose 5 % 10 mL  10 mL Intravenous Q24H Judyann Munson, MD   10 mL at 12/03/17 2020  . docusate (COLACE) 50 MG/5ML liquid 25 mg  25 mg Both EARS BID Haydee Salter, MD   25 mg at 12/04/17 1000  . [START ON 12/05/2017] enoxaparin (LOVENOX) injection 40 mg  40  mg Subcutaneous Q24H Ghimire, Shanker M, MD      . feeding supplement (ENSURE ENLIVE) (ENSURE ENLIVE) liquid 237 mL  237 mL Oral TID BM Rodolph Bong, MD   237 mL at 12/04/17 1441  . flucytosine (ANCOBON) capsule 1,250 mg  25 mg/kg Oral Q6H Judyann Munson, MD   1,250 mg at 12/04/17 1255  . [START ON 12/05/2017] furosemide (LASIX) injection 40 mg  40 mg Intravenous Daily Ghimire, Shanker M, MD      . hydrALAZINE (APRESOLINE) injection 10 mg  10 mg Intravenous Q8H PRN Haydee Salter, MD   10 mg at 11/28/17 2244  . lisinopril (PRINIVIL,ZESTRIL) tablet 5 mg  5 mg Oral Daily Pricilla Riffle, MD   5 mg at 12/04/17 1442  . meperidine (DEMEROL) injection 25 mg  25 mg Intravenous Q15 min PRN Judyann Munson, MD      . multivitamin with minerals tablet 1 tablet  1 tablet Oral Daily Rodolph Bong, MD   1 tablet at 12/04/17 1437  . sodium chloride 0.9 % bolus 500 mL  500 mL Intravenous Q24H Maretta Bees, MD   500 mL at 12/03/17 1633  . sodium chloride 0.9 % bolus 500 mL  500 mL Intravenous Q24H Maretta Bees, MD   500 mL at 12/03/17 2030  . sodium chloride flush (NS) 0.9 % injection 3 mL  3 mL Intravenous Q12H Haydee Salter, MD   3 mL at 12/04/17 1045  . sulfamethoxazole-trimethoprim (BACTRIM DS,SEPTRA DS) 800-160 MG per tablet 1 tablet  1 tablet Oral Daily Rodolph Bong, MD   1 tablet at 12/04/17 1438     Discharge Medications: Please see discharge summary for a list of discharge medications.  Relevant Imaging Results:  Relevant Lab Results:   Additional Information SSN   [id number]  Antionette Poles, LCSW

## 2017-12-04 NOTE — Progress Notes (Addendum)
PHARMACY BRIEF NOTE:   ELECTROLYTE REPLACEMENT PER LIPSOMAL AMPHOTERICIN B PROTOCOL  Ambisome 240 mg (5 mg/kg) IV q24h in combination with flucytosine 1250 mg (25 mg/kg) PO q6h  for cryptococcal meningitis.   ID service was consulted and is actively following.  Recent Labs    12/03/17 0429 12/04/17 0423  NA 126* 128*  K 4.5 4.8  CL 98* 98*  CO2 23 25  GLUCOSE 90 95  BUN 19 24*  CREATININE 0.78 1.07  CALCIUM 8.3* 8.5*  MG 2.1 1.8    Assessment: K 4.8 now on KDur 20mg  daily per MD, goal > 4 Mag 1.8 after multiple Mg sulfate IV boluses, most recent 4 gm on 1/16, goal > 2 SCr increased to 1.07, UOP recorded as 6.7L over past 24hr   Plan, per Amphotericin B Protocol: Mag Sulfate 1 grams IV x 1 this AM Continue NS 500 mL bolus IV before and after each infusion of Ambisome Continue daily BMet, Mg Monitor SCr, UOP  Loralee PacasErin Hailyn Zarr, PharmD, BCPS Pager: [phone] 12/04/2017 6:56 AM

## 2017-12-04 NOTE — Progress Notes (Signed)
[place] for Infectious Disease    Date of Admission:  11/26/2017   Total days of antibiotics 8        Day 8 ampho/flucytosine/bactrim          ID: Kenneth Rivas is a 57 y.o. 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 Still has yeast on gram stain. cx pending. Sleeping on his side in his room  Medications:  . carvedilol  3.125 mg Oral BID WC  . dextrose  10 mL Intravenous Q24H  . dextrose  10 mL Intravenous Q24H  . docusate  25 mg Both EARS BID  . [START ON 12/05/2017] enoxaparin (LOVENOX) injection  40 mg Subcutaneous Q24H  . feeding supplement (ENSURE ENLIVE)  237 mL Oral TID BM  . flucytosine  25 mg/kg Oral Q6H  . [START ON 12/05/2017] furosemide  40 mg Intravenous Daily  . lisinopril  5 mg Oral Daily  . multivitamin with minerals  1 tablet Oral Daily  . sodium chloride  500 mL Intravenous Q24H  . sodium chloride  500 mL Intravenous Q24H  . sodium chloride flush  3 mL Intravenous Q12H  . sulfamethoxazole-trimethoprim  1 tablet Oral Daily    Objective: Vital signs in last 24 hours: Temp:  [98.3 F (36.8 C)-98.6 F (37 C)] 98.3 F (36.8 C) (01/18 1456) Pulse Rate:  [87-98] 90 (01/18 1456) Resp:  [18] 18 (01/18 0434) BP: (100-115)/(65-78) 115/76 (01/18 1456) SpO2:  [100 %] 100 % (01/18 1456) Physical Exam  Constitutional: He is oriented to person, place, easily awakens but severely hard of hearing. He appears ungroomed, disheveled, chronically ill. No distress.  HENT:  poor dentition, pale conjunctiva Mouth/Throat: Oropharynx is clear and moist. +thrush Cardiovascular: Normal rate, regular rhythm and normal heart sounds. Exam reveals no gallop and no friction rub.  No murmur heard.  Pulmonary/Chest: Effort normal and breath sounds normal. No respiratory distress. He has no wheezes.  Abdominal: Soft. Bowel sounds are normal. He exhibits no distension. There is no tenderness.  Lymphadenopathy:  He has no cervical adenopathy.  Neurological: He is alert and oriented to person, place,  Skin: Skin is warm and dry. No rash noted. No erythema.  Psychiatric: He has a normal mood and affect. His behavior is normal.     Lab Results Recent Labs    12/02/17 0421 12/03/17 0429 12/04/17 0423  WBC 2.2*  --  3.5*  HGB 9.5*  --  9.1*  HCT 28.5*  --  27.3*  NA 124* 126* 128*  K 4.2 4.5 4.8  CL 97* 98* 98*  CO2 23 23 25   BUN 12 19 24*  CREATININE 0.62 0.78 1.07    Microbiology:  Studies/Results: Dg Fluoro Guide Lumbar Puncture  Result Date: 12/04/2017 CLINICAL DATA:  AIDS.  Cryptococcal meningitis. EXAM: DIAGNOSTIC LUMBAR PUNCTURE UNDER FLUOROSCOPIC GUIDANCE FLUOROSCOPY TIME:  Fluoroscopy Time: 36 sec of low-dose pulsed fluoro Radiation Exposure Index (if provided by the fluoroscopic device): 1.85 mGy Number of Acquired Spot Images: 0 PROCEDURE: Informed  witnessed consent was obtained from the patient's sister, Kenneth Rivas, by telephone prior to the procedure, including potential complications of headache, allergy, and pain. With the patient prone, the lower back was prepped with Betadine. 1% Lidocaine was used for local anesthesia. Lumbar puncture was performed at the L3-4 level on the right using a 20 gauge needle with return of slightly amber tinged CSF with an opening pressure of 12 cm water. 6 ml of CSF were obtained for laboratory studies. The patient tolerated the procedure well and there were no apparent complications. IMPRESSION: Diagnostic lumbar  puncture performed without immediate complications. Electronically Signed   By: Richardean Sale M.D.   On: 12/04/2017 11:15     Assessment/Plan: Cryptococcal meningitis = he needs a minimum of 14 days of IV therapy, we may consider to extend if has any worsening of symptoms. Appears to be doing well on day 8. Tolerating his Iv abtx thus far, has some mild increase in his aki. His ampho dosing is going to be adjusted for 66m/kg  Leukopenia = likely from CM treatment like flucytosine. Continue to monitor  hiv disease =avoid starting ART for now, will continue on daily bactrim ds.   Long qt = will check ekg to see if can give azithro 12072miv q week for mac prophylaxis  [facility] Cell: [phone]: [phone]  12/04/2017, 3:35 PM

## 2017-12-04 NOTE — Progress Notes (Signed)
Pt refused medications. RN attempted to educate pt on importance of medications. Pt refused teaching and said he "didn't want any medicines" and is "not going to take any medicine."

## 2017-12-04 NOTE — Progress Notes (Signed)
PROGRESS NOTE        PATIENT DETAILS Name: Kenneth Rivas Age: 57 y.o. Sex: male Date of Birth: 1961-08-13 Admit Date: 11/26/2017 Admitting Physician Haydee Salter, MD PCP:[place], Lisette Grinder, MD  Brief Narrative: Patient is a 57 y.o. male with prior history of HIV/AIDS-last CD4 count less than 10, history of cryptococcal meningitis in 2018 (not very compliant with antifungals) admitted with generalized weakness, near syncopal event. Patient was noted to have a prolonged QTC, and was also found to have recurrent/persistent cryptococcal meningitis and admitted to the hospitalist service. See below for further details  Subjective: Denies any headache.  Assessment/Plan: Persistent cryptococcal meningitis: Continue reinduction therapy with IV amphotericin and Pitocin.  Continues to deny headaches.  For repeat lumbar puncture today.  Will await further recommendations from infectious disease.  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  Volume status remained stable.  Continue to monitor renal function closely.  Please note he does get approximately 1 L of IV fluids with amphotericin.   HIV/AIDS: Per infectious disease-this is very poorly controlled-ART's has been deferred in the setting of a redo induction for cryptococcal meningitis.  He remains on Bactrim for PCP prophylaxis, given prolonged QTC-he remains off Zithromax for MAI prophylaxis.  ID following.    Near syncope: Probably secondary to dehydration/orthostatic mechanism. 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 Cardiology consulted-with no further recommendations at this time.  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  Will need outpatient follow-up with cardiology.  QT  prolongation: Resolved-avoid QT prolonging agents as much as possible.  Severe protein calorie malnutrition: Continue supplements  Hypertension: Controlled with Coreg, lisinopril and Lasix.  Follow and adjust accordingly.  Anemia/leukopenia: Main stable-likely secondary to advanced HIV.  Continue to follow CBC periodically.    DVT Prophylaxis: Prophylactic Lovenox   Code Status: Full code  Family Communication: None at bedside  Disposition Plan: Remain inpatient  Antimicrobial agents: Anti-infectives (From admission, onward)   Start     Dose/Rate Route Frequency Ordered Stop   11/27/17 1800  flucytosine (ANCOBON) capsule 1,250 mg     25 mg/kg  47 kg Oral Every 6 hours 11/27/17 1615     11/27/17 1730  amphotericin B liposome (AMBISOME) 240 mg in dextrose 5 % 500 mL IVPB     5 mg/kg  47 kg 250 mL/hr over 120 Minutes Intravenous Every 24 hours 11/27/17 1615     11/27/17 1000  sulfamethoxazole-trimethoprim (BACTRIM DS,SEPTRA DS) 800-160 MG per tablet 1 tablet     1 tablet Oral Daily 11/27/17 0839        Procedures: 1/11>> fluoroscopy guided lumbar puncture-opening pressure of 36 cm of water.  CONSULTS:  ID  Time spent: 25 minutes-Greater than 50% of this time was spent in counseling, explanation of diagnosis, planning of further management, and coordination of care.  MEDICATIONS: Scheduled Meds: . carvedilol  3.125 mg Oral BID WC  . dextrose  10 mL Intravenous Q24H  . dextrose  10 mL Intravenous Q24H  . docusate  25 mg Both EARS BID  . [START ON 12/05/2017] enoxaparin (LOVENOX) injection  40 mg Subcutaneous Q24H  . feeding supplement (ENSURE ENLIVE)  237 mL Oral TID BM  . flucytosine  25 mg/kg  Oral Q6H  . furosemide  40 mg Intravenous BID  . lisinopril  5 mg Oral Daily  . multivitamin with minerals  1 tablet Oral Daily  . sodium chloride  500 mL Intravenous Q24H  . sodium chloride  500 mL Intravenous Q24H  . sodium chloride flush  3 mL Intravenous Q12H  .  sulfamethoxazole-trimethoprim  1 tablet Oral Daily   Continuous Infusions: . amphotericin  B  Liposome (AMBISOME) ADULT IV Stopped (12/03/17 2014)  . magnesium sulfate 1 - 4 g bolus IVPB     PRN Meds:.acetaminophen **OR** acetaminophen, hydrALAZINE, meperidine (DEMEROL) injection   PHYSICAL EXAM: Vital signs: Vitals:   12/03/17 0900 12/03/17 1432 12/03/17 2147 12/04/17 0434  BP: 130/88 104/71 100/78 102/65  Pulse: 86 99 98 87  Resp:  16 18 18   Temp: 98.4 F (36.9 C) 98.6 F (37 C) 98.6 F (37 C) 98.3 F (36.8 C)  TempSrc: Oral Oral Oral Axillary  SpO2: 100% 100% 100% 100%  Weight:      Height:       Filed Weights   11/30/17 0500 12/01/17 0500 12/02/17 0357  Weight: 49.4 kg (108 lb 14.5 oz) 49.2 kg (108 lb 7.5 oz) 48.9 kg (107 lb 12.9 oz)   Body mass index is 17.4 kg/m.   General appearance :Awake, alert, not in any distress.  Eyes:, pupils equally reactive to light and accomodation,no scleral icterus. HEENT: Atraumatic and Normocephalic Neck: supple, no JVD. Resp:Good air entry bilaterally CVS: S1 S2 regular, no murmurs.  GI: Bowel sounds present, Non tender and not distended with no gaurding, rigidity or rebound. Extremities: B/L Lower Ext shows no edema, both legs are warm to touch Neurology:  speech clear,Non focal, sensation is grossly intact. Musculoskeletal:No digital cyanosis Skin:No Rash, warm and dry Wounds:N/A  I have personally reviewed following labs and imaging studies  LABORATORY DATA: CBC: Recent Labs  Lab 11/28/17 0429 11/29/17 0528 11/30/17 0447 12/01/17 0522 12/02/17 0421 12/04/17 0423  WBC 3.3* 2.3* 1.8* 2.4* 2.2* 3.5*  NEUTROABS 2.6 1.7 1.0* 1.5*  --   --   HGB 9.5* 9.8* 9.9* 9.7* 9.5* 9.1*  HCT 29.2* 29.3* 29.2* 29.3* 28.5* 27.3*  MCV 83.2 82.5 82.5 82.8 81.9 81.3  PLT 73* 76* 78* 104* 116* 159    Basic Metabolic Panel: Recent Labs  Lab 11/30/17 0447 12/01/17 0522 12/02/17 0421 12/03/17 0429 12/04/17 0423  NA 125* 129*  124* 126* 128*  K 3.9 4.4 4.2 4.5 4.8  CL 96* 98* 97* 98* 98*  CO2 23 25 23 23 25   GLUCOSE 91 91 87 90 95  BUN 12 14 12 19  24*  CREATININE 0.65 0.65 0.62 0.78 1.07  CALCIUM 7.9* 8.0* 7.9* 8.3* 8.5*  MG 1.9 1.6* 1.7 2.1 1.8    GFR: Estimated Creatinine Clearance: 52 mL/min (by C-G formula based on SCr of 1.07 mg/dL).  Liver Function Tests: No results for input(s): AST, ALT, ALKPHOS, BILITOT, PROT, ALBUMIN in the last 168 hours. No results for input(s): LIPASE, AMYLASE in the last 168 hours. No results for input(s): AMMONIA in the last 168 hours.  Coagulation Profile: No results for input(s): INR, PROTIME in the last 168 hours.  Cardiac Enzymes: Recent Labs  Lab 11/27/17 1608 11/27/17 2205  TROPONINI <0.03 <0.03    BNP (last 3 results) No results for input(s): PROBNP in the last 8760 hours.  HbA1C: No results for input(s): HGBA1C in the last 72 hours.  CBG: No results for input(s): GLUCAP in the last 168 hours.  Lipid Profile: No results for input(s): CHOL, HDL, LDLCALC, TRIG, CHOLHDL, LDLDIRECT in the last 72 hours.  Thyroid Function Tests: No results for input(s): TSH, T4TOTAL, FREET4, T3FREE, THYROIDAB in the last 72 hours.  Anemia Panel: No results for input(s): VITAMINB12, FOLATE, FERRITIN, TIBC, IRON, RETICCTPCT in the last 72 hours.  Urine analysis:    Component Value Date/Time   COLORURINE YELLOW 11/30/2017 1150   APPEARANCEUR CLEAR 11/30/2017 1150   LABSPEC 1.009 11/30/2017 1150   PHURINE 8.0 11/30/2017 1150   GLUCOSEU 50 (A) 11/30/2017 1150   HGBUR NEGATIVE 11/30/2017 1150   BILIRUBINUR NEGATIVE 11/30/2017 1150   KETONESUR NEGATIVE 11/30/2017 1150   PROTEINUR NEGATIVE 11/30/2017 1150   NITRITE NEGATIVE 11/30/2017 1150   LEUKOCYTESUR NEGATIVE 11/30/2017 1150    Sepsis Labs: Lactic Acid, Venous    Component Value Date/Time   LATICACIDVEN 2.03 (HH) 06/16/2017 1924    MICROBIOLOGY: Recent Results (from the past 240 hour(s))  CSF culture      Status: None   Collection Time: 11/27/17  3:33 PM  Result Value Ref Range Status   Specimen Description CSF  Final   Special Requests NONE  Final   Gram Stain   Final    WBC PRESENT, PREDOMINANTLY MONONUCLEAR YEAST PRESENT CYTOSPIN SMEAR CRITICAL RESULT CALLED TO, READ BACK BY AND VERIFIED WITH: R.CORCORAN AT 1752 ON 11/27/17 BY N.THOMPSON    Culture ABUNDANT CRYPTOCOCCUS NEOFORMANS  Final   Report Status 11/30/2017 FINAL  Final  Fungus Culture With Stain     Status: None   Collection Time: 11/27/17  3:33 PM  Result Value Ref Range Status   Fungus Stain Final report  Final   Fungus (Mycology) Culture Preliminary report  Final    Comment: (NOTE) Performed At: [facility] [address] [place], [place] [id number] Jolene Schimke MD NW:[id number]    Fungal Source CSF  Final  Culture, fungus without smear     Status: Abnormal (Preliminary result)   Collection Time: 11/27/17  3:33 PM  Result Value Ref Range Status   Specimen Description CSF  Final   Special Requests PENDING  Incomplete   Culture CRYPTOCOCCUS NEOFORMANS (A)  Final   Report Status PENDING  Incomplete  Fungus Culture Result     Status: None   Collection Time: 11/27/17  3:33 PM  Result Value Ref Range Status   Result 1 Comment  Final    Comment: (NOTE) Fungal elements, such as arthroconidia, hyphal fragments, chlamydoconidia, observed. Performed At: [facility] [address] [place], [place] [id number] Jolene Schimke MD BM:[id number]   Fungal organism reflex     Status: None   Collection Time: 11/27/17  3:33 PM  Result Value Ref Range Status   Fungal result 1 Comment  Final    Comment: (NOTE) Yeast isolated, identification in progress. Identification to follow. NOTIFIED/FAXED ACE Q. AT 0837 ON [id number].(BL) FAX [phone] Performed At: [facility] [address] [place], [place] [id number] Jolene Schimke MD PI:[id number]   Culture, Urine     Status: None   Collection Time:  11/30/17 11:50 AM  Result Value Ref Range Status   Specimen Description URINE, CLEAN CATCH  Final   Special Requests NONE  Final   Culture   Final    NO GROWTH Performed at [facility], [address], [place], [place] [address]    Report Status 12/02/2017 FINAL  Final    RADIOLOGY STUDIES/RESULTS: Dg Chest 2 View  Result Date: 11/26/2017 CLINICAL DATA:  Weakness EXAM: CHEST  2 VIEW COMPARISON:  07/11/2017 chest radiograph. FINDINGS: Stable cardiomediastinal silhouette with normal heart size. No pneumothorax. No pleural effusion. Lungs appear clear, with no acute consolidative airspace disease and no pulmonary edema. IMPRESSION: No active cardiopulmonary disease. Electronically Signed   By: Delbert Phenix M.D.   On: 11/26/2017 17:27   Ct Head Wo Contrast  Result Date: 11/26/2017 CLINICAL DATA:  Syncopal episodes, generalized weakness, focal neural deficit of greater than 6 hours, history of HIV, cryptococcal meningitis, hypertension EXAM: CT HEAD WITHOUT CONTRAST TECHNIQUE: Contiguous axial images were obtained from the base of the skull through the vertex without intravenous contrast. Sagittal and coronal MPR images reconstructed from axial data set. COMPARISON:  06/24/2017 FINDINGS: Brain: Exam degraded by patient motion. Generalized atrophy. Normal ventricular morphology. No midline shift or mass effect. Scattered areas of white matter hypoattenuation particularly frontal regions. No intracranial hemorrhage, mass lesion, or evidence acute infarction. No extra-axial fluid collections. Vascular: Unremarkable Skull: Unremarkable Sinuses/Orbits: Clear Other: N/A IMPRESSION: White matter hypoattenuation in the frontal regions greater on RIGHT similar prior exam. No new intracranial abnormalities. Electronically Signed   By: Ulyses Southward M.D.   On: 11/26/2017 17:05   Dg Fluoro Guide Lumbar Puncture  Result Date: 11/27/2017 CLINICAL DATA:  AIDS.  Evaluate for cryptococcal meningitis. EXAM:  DIAGNOSTIC LUMBAR PUNCTURE UNDER FLUOROSCOPIC GUIDANCE FLUOROSCOPY TIME:  Fluoroscopy Time:  1 minute 14 seconds Radiation Exposure Index (if provided by the fluoroscopic device): 10 mGy Number of Acquired Spot Images: 1 PROCEDURE: Informed consent was obtained from the patient prior to the procedure, including potential complications of headache, allergy, and pain. With the patient prone, the lower back was prepped with Betadine. 1% Lidocaine was used for local anesthesia. Lumbar puncture was performed at the L2-3 level using a 20 gauge needle with return of clear CSF with an opening pressure of 36 cm water. 9.5 ml of CSF were obtained for laboratory studies. The patient tolerated the procedure well and there were no apparent complications. IMPRESSION: 1. Successful lumbar puncture at L2-3. 2. Opening pressure of 36 cm water.  9.5 cc of CSF collected. Electronically Signed   By: Signa Kell M.D.   On: 11/27/2017 15:43     LOS: 7 days   Jeoffrey Massed, MD  [facility] Pager:[phone]  If 7PM-7AM, please contact night-coverage [email or web address] Password [email or web address] 12/04/2017, 11:11 AM

## 2017-12-04 NOTE — Clinical Social Work Note (Signed)
Clinical Social Work Assessment  Patient Details  Name: Kenneth Rivas MRN: [id number] Date of Birth: 03/03/1961  Date of referral:  12/04/17               Reason for consult:  Facility Placement                Permission sought to share information with:  Facility Medical sales representativeContact Representative, Family Supports Permission granted to share information::  Yes, Verbal Permission Granted  Name::     Kenneth Rivas  Agency::     Relationship::  Roommate   Contact Information:  [phone]  Housing/Transportation Living arrangements for the past 2 months:  [facility] Source of Information:  Patient, Engineer, miningriend/Neighbor Patient Interpreter Needed:  None Criminal Activity/Legal Involvement Pertinent to Current Situation/Hospitalization:  No - Comment as needed Significant Relationships:  Friend Lives with:  Roommate Do you feel safe going back to the place where you live?  Yes Need for family participation in patient care:  Yes (Comment)  Care giving concerns:  Patient from home with roommate. Patient's roommate reported that he was assisting patient with dressing/bathing prior to hospitalization. Patient's roommate reported that patient needs to learn how to walk again. PT recommending SNF for ST rehab.    Social Worker assessment / plan:  CSW spoke with patient/patient's roommate at bedside regarding discharge planning. CSW informed patient that PT recommended SNF for ST rehab. Patient reported that he was agreeable. CSW explained SNF placement process and informed patient that in order for medicaid to cover SNF he would have to stay 30 days and his check would need to be signed over to SNF while patient is admitted to SNF. Patient's roommate reported that patient receives approx. 500/month in disability.  Patient reported that he was agreeable to staying 30 days and signing his check over. Patient's roommate reported that patient is able to return home with his assistance if patient is unable to be placed at  SNF.   CSW will complete FL2 and follow up with bed offers.   CSW will continue to follow assist with discharge planning.   Possible Barriers: Positive drug screen on admission   Employment status:  Disabled (Comment on whether or not currently receiving Disability) [place] information[place]  [place]  Skilled Nursing Facility Information / Referral to community resources:  Skilled Nursing Facility  Patient/Family's Response to care:  Patient agreeable to SNF for ST rehab.   Patient/Family's Understanding of and Emotional Response to Diagnosis, Current Treatment, and Prognosis:  Patient presented calm with minimal speech during assessment. Patient verbalized plan to dc to SNF for rehab. Patient accompanied by roommate who is a support for patient.   Emotional Assessment Appearance:  Disheveled Attitude/Demeanor/Rapport:  Other(Cooperative) Affect (typically observed):  Calm Orientation:  Oriented to Self, Oriented to Place, Oriented to  Time, Oriented to Situation Alcohol / Substance use:  Illicit Drugs Psych involvement (Current and /or in the community):  No (Comment)  Discharge Needs  Concerns to be addressed:  Care Coordination Readmission within the last 30 days:  No Current discharge risk:  Physical Impairment Barriers to Discharge:  Continued Medical Work up   USG CorporationKimberly L Jeania Nater, LCSW 12/04/2017, 1:25 PM

## 2017-12-05 LAB — BASIC METABOLIC PANEL
ANION GAP: 5 (ref 5–15)
BUN: 27 mg/dL — ABNORMAL HIGH (ref 6–20)
CO2: 24 mmol/L (ref 22–32)
Calcium: 8.3 mg/dL — ABNORMAL LOW (ref 8.9–10.3)
Chloride: 98 mmol/L — ABNORMAL LOW (ref 101–111)
Creatinine, Ser: 1.23 mg/dL (ref 0.61–1.24)
GFR calc Af Amer: >60 mL/min (ref >60)
GLUCOSE: 90 mg/dL (ref 65–99)
POTASSIUM: 4.4 mmol/L (ref 3.5–5.1)
Sodium: 127 mmol/L — ABNORMAL LOW (ref 135–145)

## 2017-12-05 LAB — MAGNESIUM: Magnesium: 2 mg/dL (ref 1.7–2.4)

## 2017-12-05 MED ORDER — SODIUM CHLORIDE 0.9 % IV BOLUS FOR AMBISOME
750.0000 mL | INTRAVENOUS | Status: DC
  Administered 2017-12-05: 750 mL via INTRAVENOUS

## 2017-12-05 MED ORDER — SODIUM CHLORIDE 0.9 % IV BOLUS FOR AMBISOME
750.0000 mL | INTRAVENOUS | Status: DC
Start: 2017-12-05 — End: 2017-12-06
  Administered 2017-12-05: 750 mL via INTRAVENOUS

## 2017-12-05 MED ORDER — MAGNESIUM SULFATE IN D5W 1-5 GM/100ML-% IV SOLN
1.0000 g | Freq: Once | INTRAVENOUS | Status: AC
  Administered 2017-12-05: 1 g via INTRAVENOUS
  Filled 2017-12-05: qty 100

## 2017-12-05 NOTE — Progress Notes (Signed)
PROGRESS NOTE        PATIENT DETAILS Name: Kenneth Rivas Age: 57 y.o. Sex: male Date of Birth: 24-Aug-1961 Admit Date: 11/26/2017 Admitting Physician Haydee Salter, MD PCP:Van Dam, Lisette Grinder, MD  Brief Narrative: Patient is a 57 y.o. male with prior history of HIV/AIDS-last CD4 count less than 10, history of cryptococcal meningitis in 2018 (not very compliant with antifungals) admitted with generalized weakness, near syncopal event. Patient was noted to have a prolonged QTC, and was also found to have recurrent/persistent cryptococcal meningitis and admitted to the hospitalist service. See below for further details  Subjective:  Patient in bed, appears comfortable, denies any headache, no fever, no chest pain or pressure, no shortness of breath , no abdominal pain. No focal weakness.  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  Continues to deny headaches.  Status post repeat lumbar puncture on 12/04/2017 results awaited.  Be on board will defer further management to ID.  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   HIV/AIDS: Per infectious disease-this is very poorly controlled-ART's has been deferred in the setting of a redo induction for cryptococcal meningitis.  He remains on Bactrim for PCP prophylaxis, given prolonged QTC-he remains off Zithromax for MAI prophylaxis.  ID following.    Near syncope: Probably secondary to dehydration/orthostatic mechanism. 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 Cardiology consulted-with no further recommendations at this time.  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   Will need outpatient follow-up with cardiology.  QT prolongation: Resolved-avoid QT prolonging agents as much as possible.  Severe protein calorie malnutrition: Continue supplements  Hypertension: Controlled with Coreg, lisinopril and Lasix.  Follow and adjust accordingly.  Anemia/leukopenia: Main stable-likely secondary to advanced HIV.  Continue to follow CBC periodically.       DVT Prophylaxis: Prophylactic Lovenox   Code Status: Full code  Family Communication: None at bedside  Disposition Plan: Remain inpatient  Antimicrobial agents: Anti-infectives (From admission, onward)   Start     Dose/Rate Route Frequency Ordered Stop   12/04/17 1730  amphotericin B liposome (AMBISOME) 200 mg in dextrose 5 % 500 mL IVPB     200 mg 250 mL/hr over 120 Minutes Intravenous Every 24 hours 12/04/17 1350     11/27/17 1800  flucytosine (ANCOBON) capsule 1,250 mg     25 mg/kg  47 kg Oral Every 6 hours 11/27/17 1615     11/27/17 1730  amphotericin B liposome (AMBISOME) 240 mg in dextrose 5 % 500 mL IVPB  Status:  Discontinued     5 mg/kg  47 kg 250 mL/hr over 120 Minutes Intravenous Every 24 hours 11/27/17 1615 12/04/17 1350   11/27/17 1000  sulfamethoxazole-trimethoprim (BACTRIM DS,SEPTRA DS) 800-160 MG per tablet 1 tablet     1 tablet Oral Daily 11/27/17 0839        Procedures: 1/11>> fluoroscopy guided lumbar puncture-opening pressure of 36 cm of water.  CONSULTS:  ID  Time spent: 25 minutes-Greater than 50% of this time was spent in counseling, explanation of diagnosis, planning of further management, and coordination of care.  MEDICATIONS: Scheduled Meds: . carvedilol  3.125 mg Oral BID WC  .  dextrose  10 mL Intravenous Q24H  . dextrose  10 mL Intravenous Q24H  . docusate  25 mg Both EARS BID  . enoxaparin (LOVENOX) injection  40 mg Subcutaneous Q24H  . feeding supplement (ENSURE ENLIVE)  237 mL Oral TID BM  . flucytosine  25 mg/kg Oral Q6H  . furosemide  40 mg  Intravenous Daily  . lisinopril  5 mg Oral Daily  . multivitamin with minerals  1 tablet Oral Daily  . sodium chloride  500 mL Intravenous Q24H  . sodium chloride  500 mL Intravenous Q24H  . sodium chloride flush  3 mL Intravenous Q12H  . sulfamethoxazole-trimethoprim  1 tablet Oral Daily   Continuous Infusions: . amphotericin  B  Liposome (AMBISOME) ADULT IV Stopped (12/04/17 2143)  . magnesium sulfate 1 - 4 g bolus IVPB     PRN Meds:.acetaminophen **OR** acetaminophen, hydrALAZINE, meperidine (DEMEROL) injection   PHYSICAL EXAM: Vital signs: Vitals:   12/04/17 1456 12/04/17 2239 12/05/17 0500 12/05/17 0933  BP: 115/76 (!) 143/90 104/75 106/72  Pulse: 90 87 88 82  Resp:  16 16   Temp: 98.3 F (36.8 C) 98.9 F (37.2 C) 98.5 F (36.9 C)   TempSrc: Oral Oral Oral   SpO2: 100% 100% 99%   Weight:   47.7 kg (105 lb 2.6 oz)   Height:       Filed Weights   12/01/17 0500 12/02/17 0357 12/05/17 0500  Weight: 49.2 kg (108 lb 7.5 oz) 48.9 kg (107 lb 12.9 oz) 47.7 kg (105 lb 2.6 oz)   Body mass index is 16.97 kg/m.   Exam  Awake Alert, Oriented X 3, No new F.N deficits, Normal affect [place].AT,PERRAL Supple Neck,No JVD, No cervical lymphadenopathy appriciated.  Symmetrical Chest wall movement, Good air movement bilaterally, CTAB RRR,No Gallops, Rubs or new Murmurs, No Parasternal Heave +ve B.Sounds, Abd Soft, No tenderness, No organomegaly appriciated, No rebound - guarding or rigidity. No Cyanosis, Clubbing or edema, No new Rash or bruise  I have personally reviewed following labs and imaging studies  LABORATORY DATA: CBC: Recent Labs  Lab 11/29/17 0528 11/30/17 0447 12/01/17 0522 12/02/17 0421 12/04/17 0423  WBC 2.3* 1.8* 2.4* 2.2* 3.5*  NEUTROABS 1.7 1.0* 1.5*  --   --   HGB 9.8* 9.9* 9.7* 9.5* 9.1*  HCT 29.3* 29.2* 29.3* 28.5* 27.3*  MCV 82.5 82.5 82.8 81.9 81.3  PLT 76* 78* 104* 116* 159    Basic Metabolic Panel: Recent Labs  Lab 12/01/17 0522 12/02/17 0421  12/03/17 0429 12/04/17 0423 12/05/17 0434  NA 129* 124* 126* 128* 127*  K 4.4 4.2 4.5 4.8 4.4  CL 98* 97* 98* 98* 98*  CO2 25 23 23 25 24   GLUCOSE 91 87 90 95 90  BUN 14 12 19  24* 27*  CREATININE 0.65 0.62 0.78 1.07 1.23  CALCIUM 8.0* 7.9* 8.3* 8.5* 8.3*  MG 1.6* 1.7 2.1 1.8 2.0    GFR: Estimated Creatinine Clearance: 44.2 mL/min (by C-G formula based on SCr of 1.23 mg/dL).  Liver Function Tests: No results for input(s): AST, ALT, ALKPHOS, BILITOT, PROT, ALBUMIN in the last 168 hours. No results for input(s): LIPASE, AMYLASE in the last 168 hours. No results for input(s): AMMONIA in the last 168 hours.  Coagulation Profile: No results for input(s): INR, PROTIME in the last 168 hours.  Cardiac Enzymes: No results for input(s): CKTOTAL, CKMB, CKMBINDEX, TROPONINI in the last 168 hours.  BNP (last 3 results) No results for input(s): PROBNP in the last  8760 hours.  HbA1C: No results for input(s): HGBA1C in the last 72 hours.  CBG: No results for input(s): GLUCAP in the last 168 hours.  Lipid Profile: No results for input(s): CHOL, HDL, LDLCALC, TRIG, CHOLHDL, LDLDIRECT in the last 72 hours.  Thyroid Function Tests: No results for input(s): TSH, T4TOTAL, FREET4, T3FREE, THYROIDAB in the last 72 hours.  Anemia Panel: No results for input(s): VITAMINB12, FOLATE, FERRITIN, TIBC, IRON, RETICCTPCT in the last 72 hours.  Urine analysis:    Component Value Date/Time   COLORURINE YELLOW 11/30/2017 1150   APPEARANCEUR CLEAR 11/30/2017 1150   LABSPEC 1.009 11/30/2017 1150   PHURINE 8.0 11/30/2017 1150   GLUCOSEU 50 (A) 11/30/2017 1150   HGBUR NEGATIVE 11/30/2017 1150   BILIRUBINUR NEGATIVE 11/30/2017 1150   KETONESUR NEGATIVE 11/30/2017 1150   PROTEINUR NEGATIVE 11/30/2017 1150   NITRITE NEGATIVE 11/30/2017 1150   LEUKOCYTESUR NEGATIVE 11/30/2017 1150    Sepsis Labs: Lactic Acid, Venous    Component Value Date/Time   LATICACIDVEN 2.03 (HH) 06/16/2017 1924     MICROBIOLOGY: Recent Results (from the past 240 hour(s))  CSF culture     Status: None   Collection Time: 11/27/17  3:33 PM  Result Value Ref Range Status   Specimen Description CSF  Final   Special Requests NONE  Final   Gram Stain   Final    WBC PRESENT, PREDOMINANTLY MONONUCLEAR YEAST PRESENT CYTOSPIN SMEAR CRITICAL RESULT CALLED TO, READ BACK BY AND VERIFIED WITH: R.CORCORAN AT 1752 ON 11/27/17 BY N.THOMPSON    Culture ABUNDANT CRYPTOCOCCUS NEOFORMANS  Final   Report Status 11/30/2017 FINAL  Final  Fungus Culture With Stain     Status: None   Collection Time: 11/27/17  3:33 PM  Result Value Ref Range Status   Fungus Stain Final report  Final   Fungus (Mycology) Culture Preliminary report  Final    Comment: (NOTE) Performed At: [facility] [address] [place], [place] [id number] Jolene Schimke MD NW:[id number]    Fungal Source CSF  Final  Culture, fungus without smear     Status: Abnormal (Preliminary result)   Collection Time: 11/27/17  3:33 PM  Result Value Ref Range Status   Specimen Description CSF  Final   Special Requests PENDING  Incomplete   Culture CRYPTOCOCCUS NEOFORMANS (A)  Final   Report Status PENDING  Incomplete  Fungus Culture Result     Status: None   Collection Time: 11/27/17  3:33 PM  Result Value Ref Range Status   Result 1 Comment  Final    Comment: (NOTE) Fungal elements, such as arthroconidia, hyphal fragments, chlamydoconidia, observed. Performed At: [facility] [address] [place], [place] [id number] Jolene Schimke MD BM:[id number]   Fungal organism reflex     Status: None   Collection Time: 11/27/17  3:33 PM  Result Value Ref Range Status   Fungal result 1 Comment  Final    Comment: (NOTE) Yeast isolated, identification in progress. Identification to follow. NOTIFIED/FAXED ACE Q. AT 0837 ON [id number].(BL) FAX [phone] Performed At: [facility] [address] [place], [place] [id number] Jolene Schimke MD PI:[id number]   Culture, Urine     Status: None   Collection Time: 11/30/17 11:50 AM  Result Value Ref Range Status   Specimen Description URINE, CLEAN CATCH  Final   Special Requests NONE  Final   Culture   Final    NO GROWTH Performed at [facility], [address], [place], [place] [address]  Report Status 12/02/2017 FINAL  Final  CSF culture     Status: None (Preliminary result)   Collection Time: 12/04/17 10:28 AM  Result Value Ref Range Status   Specimen Description CSF  Final   Special Requests NONE  Final   Gram Stain   Final    WBC PRESENT, PREDOMINANTLY MONONUCLEAR YEAST PRESENT CYTOSPIN SMEAR CRITICAL RESULT CALLED TO, READ BACK BY AND VERIFIED WITH: POWERS,A @ 1209 ON [id number] BY POTEAT,S    Culture   Final    NO GROWTH 1 DAY Performed at [facility], [address], [place], [place] [address]    Report Status PENDING  Incomplete    RADIOLOGY STUDIES/RESULTS: Dg Chest 2 View  Result Date: 11/26/2017 CLINICAL DATA:  Weakness EXAM: CHEST  2 VIEW COMPARISON:  07/11/2017 chest radiograph. FINDINGS: Stable cardiomediastinal silhouette with normal heart size. No pneumothorax. No pleural effusion. Lungs appear clear, with no acute consolidative airspace disease and no pulmonary edema. IMPRESSION: No active cardiopulmonary disease. Electronically Signed   By: Delbert Phenix M.D.   On: 11/26/2017 17:27   Ct Head Wo Contrast  Result Date: 11/26/2017 CLINICAL DATA:  Syncopal episodes, generalized weakness, focal neural deficit of greater than 6 hours, history of HIV, cryptococcal meningitis, hypertension EXAM: CT HEAD WITHOUT CONTRAST TECHNIQUE: Contiguous axial images were obtained from the base of the skull through the vertex without intravenous contrast. Sagittal and coronal MPR images reconstructed from axial data set. COMPARISON:  06/24/2017 FINDINGS: Brain: Exam degraded by patient motion. Generalized atrophy. Normal ventricular morphology. No midline  shift or mass effect. Scattered areas of white matter hypoattenuation particularly frontal regions. No intracranial hemorrhage, mass lesion, or evidence acute infarction. No extra-axial fluid collections. Vascular: Unremarkable Skull: Unremarkable Sinuses/Orbits: Clear Other: N/A IMPRESSION: White matter hypoattenuation in the frontal regions greater on RIGHT similar prior exam. No new intracranial abnormalities. Electronically Signed   By: Ulyses Southward M.D.   On: 11/26/2017 17:05   Dg Fluoro Guide Lumbar Puncture  Result Date: 12/04/2017 CLINICAL DATA:  AIDS.  Cryptococcal meningitis. EXAM: DIAGNOSTIC LUMBAR PUNCTURE UNDER FLUOROSCOPIC GUIDANCE FLUOROSCOPY TIME:  Fluoroscopy Time: 36 sec of low-dose pulsed fluoro Radiation Exposure Index (if provided by the fluoroscopic device): 1.85 mGy Number of Acquired Spot Images: 0 PROCEDURE: Informed witnessed consent was obtained from the patient's sister, Sharin Mons, by telephone prior to the procedure, including potential complications of headache, allergy, and pain. With the patient prone, the lower back was prepped with Betadine. 1% Lidocaine was used for local anesthesia. Lumbar puncture was performed at the L3-4 level on the right using a 20 gauge needle with return of slightly amber tinged CSF with an opening pressure of 12 cm water. 6 ml of CSF were obtained for laboratory studies. The patient tolerated the procedure well and there were no apparent complications. IMPRESSION: Diagnostic lumbar puncture performed without immediate complications. Electronically Signed   By: Carey Bullocks M.D.   On: 12/04/2017 11:15   Dg Fluoro Guide Lumbar Puncture  Result Date: 11/27/2017 CLINICAL DATA:  AIDS.  Evaluate for cryptococcal meningitis. EXAM: DIAGNOSTIC LUMBAR PUNCTURE UNDER FLUOROSCOPIC GUIDANCE FLUOROSCOPY TIME:  Fluoroscopy Time:  1 minute 14 seconds Radiation Exposure Index (if provided by the fluoroscopic device): 10 mGy Number of Acquired Spot Images: 1  PROCEDURE: Informed consent was obtained from the patient prior to the procedure, including potential complications of headache, allergy, and pain. With the patient prone, the lower back was prepped with Betadine. 1% Lidocaine was used for local anesthesia. Lumbar puncture  was performed at the L2-3 level using a 20 gauge needle with return of clear CSF with an opening pressure of 36 cm water. 9.5 ml of CSF were obtained for laboratory studies. The patient tolerated the procedure well and there were no apparent complications. IMPRESSION: 1. Successful lumbar puncture at L2-3. 2. Opening pressure of 36 cm water.  9.5 cc of CSF collected. Electronically Signed   By: Signa Kellaylor  Stroud M.D.   On: 11/27/2017 15:43     LOS: 8 days   Signature  Susa RaringPrashant Carmeron Heady M.D on 12/05/2017 at 11:16 AM  Between 7am to 7pm - Pager - [phone] ( page via [email or web address], text pages only, please mention full 10 digit call back number).  After 7pm go to [email or web address] - password [email or web address]

## 2017-12-05 NOTE — Plan of Care (Signed)
Dr. Text to please call sister Sharin Mons(Teresa Moore) to update of patient's current situation and POC.

## 2017-12-05 NOTE — Progress Notes (Signed)
ID PROGRESS NOTE:   The patient is tolerating his treatment for CM, appears to be having some worsening aki. Cr doubled from his admit, steadily climbing.  - will increase pre/post IVF to (up from ) with his ampho. - trying to get him through 14 d of treatment

## 2017-12-06 LAB — BASIC METABOLIC PANEL
Anion gap: 6 (ref 5–15)
BUN: 30 mg/dL — AB (ref 6–20)
CO2: 21 mmol/L — ABNORMAL LOW (ref 22–32)
CREATININE: 1.45 mg/dL — AB (ref 0.61–1.24)
Calcium: 8.3 mg/dL — ABNORMAL LOW (ref 8.9–10.3)
Chloride: 100 mmol/L — ABNORMAL LOW (ref 101–111)
GFR calc Af Amer: 60 mL/min — ABNORMAL LOW (ref >60)
GFR, EST NON AFRICAN AMERICAN: 52 mL/min — AB (ref >60)
GLUCOSE: 89 mg/dL (ref 65–99)
Potassium: 4.7 mmol/L (ref 3.5–5.1)
SODIUM: 127 mmol/L — AB (ref 135–145)

## 2017-12-06 LAB — CBC
HEMATOCRIT: 24.8 % — AB (ref 39.0–52.0)
Hemoglobin: 8.4 g/dL — ABNORMAL LOW (ref 13.0–17.0)
MCH: 27.1 pg (ref 26.0–34.0)
MCHC: 33.9 g/dL (ref 30.0–36.0)
MCV: 80 fL (ref 78.0–100.0)
PLATELETS: 182 10*3/uL (ref 150–400)
RBC: 3.1 MIL/uL — ABNORMAL LOW (ref 4.22–5.81)
RDW: 13.3 % (ref 11.5–15.5)
WBC: 2.8 10*3/uL — AB (ref 4.0–10.5)

## 2017-12-06 LAB — MAGNESIUM: MAGNESIUM: 2.3 mg/dL (ref 1.7–2.4)

## 2017-12-06 MED ORDER — SODIUM CHLORIDE 0.9 % IV SOLN
INTRAVENOUS | Status: AC
  Administered 2017-12-06: 12:00:00 via INTRAVENOUS

## 2017-12-06 MED ORDER — MEGESTROL ACETATE 40 MG PO TABS
40.0000 mg | ORAL_TABLET | Freq: Every day | ORAL | Status: DC
  Administered 2017-12-06 – 2017-12-22 (×16): 40 mg via ORAL
  Filled 2017-12-06 (×17): qty 1

## 2017-12-06 MED ORDER — AMPHOTERICIN B LIPOSOME 50 MG IV SUSR
3.0000 mg/kg | INTRAVENOUS | Status: DC
  Administered 2017-12-06 – 2017-12-15 (×10): 150 mg via INTRAVENOUS
  Filled 2017-12-06 (×11): qty 150

## 2017-12-06 MED ORDER — SULFAMETHOXAZOLE-TRIMETHOPRIM 800-160 MG PO TABS
1.0000 | ORAL_TABLET | Freq: Every day | ORAL | Status: DC
  Administered 2017-12-10 – 2017-12-24 (×14): 1 via ORAL
  Filled 2017-12-06 (×14): qty 1

## 2017-12-06 MED ORDER — SODIUM CHLORIDE 0.9 % IV BOLUS FOR AMBISOME
1000.0000 mL | INTRAVENOUS | Status: DC
  Administered 2017-12-06 – 2017-12-22 (×17): 1000 mL via INTRAVENOUS

## 2017-12-06 MED ORDER — DEXTROSE 5% FOR FLUSHING BEFORE AND AFTER AMBISOME
10.0000 mL | INTRAVENOUS | Status: DC
  Administered 2017-12-06: 10 mL via INTRAVENOUS
  Filled 2017-12-06: qty 50

## 2017-12-06 MED ORDER — PRO-STAT SUGAR FREE PO LIQD
30.0000 mL | Freq: Three times a day (TID) | ORAL | Status: DC
  Administered 2017-12-06 – 2017-12-10 (×8): 30 mL via ORAL
  Filled 2017-12-06 (×15): qty 30

## 2017-12-06 NOTE — Progress Notes (Signed)
ID PROGRESS NOTE:   Currently on day 10 of 14 of CM treatment with liposomal ampho and flucytosine  - still has poor po intake - labs reveal still having worsening aki with cr 1.48 (baseline at 0.38) but unchanged leukopenia  A/p : acute kidney injury likely for ampho +/- other nephro associated drugs ( bactrim, furosemide)  1) will decrease his ampho dose to 3mg /kg for the rest of the infusion. Also will extend infusion slightly 2) will increase pre and post IVF flush to 1L 3) temp hold bactrim for the next 4 days 4) hold lasix for now to use if sodium drastically changes 5) encourage better oral intake. Agree with calorie count to see where he is at with nutritional intake

## 2017-12-06 NOTE — Progress Notes (Signed)
Patient refused 0000 dose of flucytosine. Patient educated on medication.

## 2017-12-06 NOTE — Progress Notes (Signed)
PROGRESS NOTE        PATIENT DETAILS Name: Kenneth Rivas Age: 57 y.o. Sex: male Date of Birth: 11-24-1960 Admit Date: 11/26/2017 Admitting Physician Haydee Salter, MD PCP:Van Dam, Lisette Grinder, MD  Brief Narrative: Patient is a 57 y.o. male with prior history of HIV/AIDS-last CD4 count less than 10, history of cryptococcal meningitis in 2018 (not very compliant with antifungals) admitted with generalized weakness, near syncopal event. Patient was noted to have a prolonged QTC, and was also found to have recurrent/persistent cryptococcal meningitis and admitted to the hospitalist service. See below for further details  Subjective:  Patient in bed, appears comfortable, denies any headache, no fever, no chest pain or pressure, no shortness of breath , no abdominal pain. No focal weakness.  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  Continues to deny headaches.  Status post repeat lumbar puncture on 12/04/2017 results awaited however preliminary results of Gram stain consistent with fungal presence.  ID on board and following. He remains quite noncompliant with his medications, per nursing staff is throwing his pills under the bed on a consistent basis, counseled him and updated his sister over the phone.  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   ARF.  Could be due to acyclovir, increase IV fluids, hold ACE inhibitor and Lasix.  Skip Bactrim for the next 2 days, monitor BMP.    HIV/AIDS: Per infectious disease-this is very poorly controlled-ART's has been deferred in the setting of a redo induction for cryptococcal meningitis.  He remains on Bactrim for PCP prophylaxis, given prolonged QTC-he remains off Zithromax for MAI  prophylaxis.  ID following.  He remains quite noncompliant with his medications, per nursing staff is throwing his pills under the bed on a consistent basis, counseled him and updated his sister over the phone.  Near syncope: Probably secondary to dehydration/orthostatic mechanism. 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 Cardiology consulted-with no further recommendations at this time.  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  Will need outpatient follow-up with cardiology.  QT prolongation: Resolved-avoid QT prolonging agents as much as possible.  Severe protein calorie malnutrition: Continue supplements  Hypertension: Controlled with Coreg, lisinopril and Lasix.  Follow and adjust accordingly.  Anemia/leukopenia: Main stable-likely secondary to advanced HIV.  Continue to follow CBC periodically.       DVT Prophylaxis: Prophylactic Lovenox   Code Status: Full code  Family Communication: Discussed with sister x2 over the phone  Disposition Plan: Remain inpatient  Antimicrobial agents: Anti-infectives (From admission, onward)   Start     Dose/Rate Route Frequency Ordered Stop   12/10/17 1000  sulfamethoxazole-trimethoprim (BACTRIM DS,SEPTRA DS) 800-160 MG per tablet 1 tablet     1 tablet Oral Daily 12/06/17 1039     12/04/17 1730  amphotericin B liposome (AMBISOME) 200 mg in dextrose 5 % 500 mL IVPB     200 mg 250 mL/hr over 120 Minutes Intravenous Every 24 hours 12/04/17 1350     11/27/17 1800  flucytosine (ANCOBON) capsule 1,250 mg     25 mg/kg  47 kg Oral  Every 6 hours 11/27/17 1615     11/27/17 1730  amphotericin B liposome (AMBISOME) 240 mg in dextrose 5 % 500 mL IVPB  Status:  Discontinued     5 mg/kg  47 kg 250 mL/hr over 120 Minutes Intravenous Every 24 hours 11/27/17 1615 12/04/17 1350    11/27/17 1000  sulfamethoxazole-trimethoprim (BACTRIM DS,SEPTRA DS) 800-160 MG per tablet 1 tablet  Status:  Discontinued     1 tablet Oral Daily 11/27/17 0839 12/06/17 1039      Procedures: 1/11>> fluoroscopy guided lumbar puncture-opening pressure of 36 cm of water.  CONSULTS:  ID  Time spent: 25 minutes-Greater than 50% of this time was spent in counseling, explanation of diagnosis, planning of further management, and coordination of care.  MEDICATIONS: Scheduled Meds: . carvedilol  3.125 mg Oral BID WC  . dextrose  10 mL Intravenous Q24H  . docusate  25 mg Both EARS BID  . enoxaparin (LOVENOX) injection  40 mg Subcutaneous Q24H  . feeding supplement (ENSURE ENLIVE)  237 mL Oral TID BM  . feeding supplement (PRO-STAT SUGAR FREE 64)  30 mL Oral TID WC  . flucytosine  25 mg/kg Oral Q6H  . megestrol  40 mg Oral Daily  . multivitamin with minerals  1 tablet Oral Daily  . sodium chloride  750 mL Intravenous Q24H  . sodium chloride  750 mL Intravenous Q24H  . sodium chloride flush  3 mL Intravenous Q12H  . [START ON 12/10/2017] sulfamethoxazole-trimethoprim  1 tablet Oral Daily   Continuous Infusions: . sodium chloride    . amphotericin  B  Liposome (AMBISOME) ADULT IV Stopped (12/05/17 2000)   PRN Meds:.acetaminophen **OR** [DISCONTINUED] acetaminophen, hydrALAZINE, meperidine (DEMEROL) injection   PHYSICAL EXAM: Vital signs: Vitals:   12/05/17 1736 12/05/17 2100 12/06/17 0500 12/06/17 0515  BP: 110/72 115/70  122/72  Pulse: 92 84  77  Resp:  20  20  Temp:  98.1 F (36.7 C)  98.5 F (36.9 C)  TempSrc:  Oral  Oral  SpO2:  98%  100%  Weight:   49.6 kg (109 lb 5.6 oz)   Height:       Filed Weights   12/02/17 0357 12/05/17 0500 12/06/17 0500  Weight: 48.9 kg (107 lb 12.9 oz) 47.7 kg (105 lb 2.6 oz) 49.6 kg (109 lb 5.6 oz)   Body mass index is 17.65 kg/m.   Exam  Awake Alert, No new F.N deficits, Normal affect [place].AT,PERRAL Supple Neck,No JVD, No cervical  lymphadenopathy appriciated.  Symmetrical Chest wall movement, Good air movement bilaterally, CTAB RRR,No Gallops, Rubs or new Murmurs, No Parasternal Heave +ve B.Sounds, Abd Soft, No tenderness, No organomegaly appriciated, No rebound - guarding or rigidity. No Cyanosis, Clubbing or edema, No new Rash or bruise  I have personally reviewed following labs and imaging studies  LABORATORY DATA: CBC: Recent Labs  Lab 11/30/17 0447 12/01/17 0522 12/02/17 0421 12/04/17 0423 12/06/17 0813  WBC 1.8* 2.4* 2.2* 3.5* 2.8*  NEUTROABS 1.0* 1.5*  --   --   --   HGB 9.9* 9.7* 9.5* 9.1* 8.4*  HCT 29.2* 29.3* 28.5* 27.3* 24.8*  MCV 82.5 82.8 81.9 81.3 80.0  PLT 78* 104* 116* 159 182    Basic Metabolic Panel: Recent Labs  Lab 12/02/17 0421 12/03/17 0429 12/04/17 0423 12/05/17 0434 12/06/17 0813  NA 124* 126* 128* 127* 127*  K 4.2 4.5 4.8 4.4 4.7  CL 97* 98* 98* 98* 100*  CO2 23 23 25 24  21*  GLUCOSE 87  90 95 90 89  BUN 12 19 24* 27* 30*  CREATININE 0.62 0.78 1.07 1.23 1.45*  CALCIUM 7.9* 8.3* 8.5* 8.3* 8.3*  MG 1.7 2.1 1.8 2.0 2.3    GFR: Estimated Creatinine Clearance: 39 mL/min (A) (by C-G formula based on SCr of 1.45 mg/dL (H)).  Liver Function Tests: No results for input(s): AST, ALT, ALKPHOS, BILITOT, PROT, ALBUMIN in the last 168 hours. No results for input(s): LIPASE, AMYLASE in the last 168 hours. No results for input(s): AMMONIA in the last 168 hours.  Coagulation Profile: No results for input(s): INR, PROTIME in the last 168 hours.  Cardiac Enzymes: No results for input(s): CKTOTAL, CKMB, CKMBINDEX, TROPONINI in the last 168 hours.  BNP (last 3 results) No results for input(s): PROBNP in the last 8760 hours.  HbA1C: No results for input(s): HGBA1C in the last 72 hours.  CBG: No results for input(s): GLUCAP in the last 168 hours.  Lipid Profile: No results for input(s): CHOL, HDL, LDLCALC, TRIG, CHOLHDL, LDLDIRECT in the last 72 hours.  Thyroid Function  Tests: No results for input(s): TSH, T4TOTAL, FREET4, T3FREE, THYROIDAB in the last 72 hours.  Anemia Panel: No results for input(s): VITAMINB12, FOLATE, FERRITIN, TIBC, IRON, RETICCTPCT in the last 72 hours.  Urine analysis:    Component Value Date/Time   COLORURINE YELLOW 11/30/2017 1150   APPEARANCEUR CLEAR 11/30/2017 1150   LABSPEC 1.009 11/30/2017 1150   PHURINE 8.0 11/30/2017 1150   GLUCOSEU 50 (A) 11/30/2017 1150   HGBUR NEGATIVE 11/30/2017 1150   BILIRUBINUR NEGATIVE 11/30/2017 1150   KETONESUR NEGATIVE 11/30/2017 1150   PROTEINUR NEGATIVE 11/30/2017 1150   NITRITE NEGATIVE 11/30/2017 1150   LEUKOCYTESUR NEGATIVE 11/30/2017 1150    Sepsis Labs: Lactic Acid, Venous    Component Value Date/Time   LATICACIDVEN 2.03 (HH) 06/16/2017 1924    MICROBIOLOGY: Recent Results (from the past 240 hour(s))  CSF culture     Status: None   Collection Time: 11/27/17  3:33 PM  Result Value Ref Range Status   Specimen Description CSF  Final   Special Requests NONE  Final   Gram Stain   Final    WBC PRESENT, PREDOMINANTLY MONONUCLEAR YEAST PRESENT CYTOSPIN SMEAR CRITICAL RESULT CALLED TO, READ BACK BY AND VERIFIED WITH: R.CORCORAN AT 1752 ON 11/27/17 BY N.THOMPSON    Culture ABUNDANT CRYPTOCOCCUS NEOFORMANS  Final   Report Status 11/30/2017 FINAL  Final  Fungus Culture With Stain     Status: None   Collection Time: 11/27/17  3:33 PM  Result Value Ref Range Status   Fungus Stain Final report  Final   Fungus (Mycology) Culture Preliminary report  Final    Comment: (NOTE) Performed At: [facility] [address] [place], [place] [id number] Jolene Schimke MD WU:[id number]    Fungal Source CSF  Final  Culture, fungus without smear     Status: Abnormal (Preliminary result)   Collection Time: 11/27/17  3:33 PM  Result Value Ref Range Status   Specimen Description CSF  Final   Special Requests PENDING  Incomplete   Culture CRYPTOCOCCUS NEOFORMANS (A)  Final   Report  Status PENDING  Incomplete  Fungus Culture Result     Status: None   Collection Time: 11/27/17  3:33 PM  Result Value Ref Range Status   Result 1 Comment  Final    Comment: (NOTE) Fungal elements, such as arthroconidia, hyphal fragments, chlamydoconidia, observed. Performed At: [facility] [address] [place], [place] [id number] Jolene Schimke MD VH:[id number]  Fungal organism reflex     Status: None   Collection Time: 11/27/17  3:33 PM  Result Value Ref Range Status   Fungal result 1 Comment  Final    Comment: (NOTE) Yeast isolated, identification in progress. Identification to follow. NOTIFIED/FAXED ACE Q. AT 0837 ON [id number].(BL) FAX [phone] Performed At: [facility] [address] [place], [place] [id number] Jolene Schimke MD NG:[id number]   Culture, Urine     Status: None   Collection Time: 11/30/17 11:50 AM  Result Value Ref Range Status   Specimen Description URINE, CLEAN CATCH  Final   Special Requests NONE  Final   Culture   Final    NO GROWTH Performed at [facility], [address], [place], [place] [address]    Report Status 12/02/2017 FINAL  Final  CSF culture     Status: None (Preliminary result)   Collection Time: 12/04/17 10:28 AM  Result Value Ref Range Status   Specimen Description CSF  Final   Special Requests NONE  Final   Gram Stain   Final    WBC PRESENT, PREDOMINANTLY MONONUCLEAR YEAST PRESENT CYTOSPIN SMEAR CRITICAL RESULT CALLED TO, READ BACK BY AND VERIFIED WITH: POWERS,A @ 1209 ON [id number] BY POTEAT,S    Culture   Final    NO GROWTH 1 DAY Performed at [facility], [address], [place], [place] [address]    Report Status PENDING  Incomplete    RADIOLOGY STUDIES/RESULTS: Dg Chest 2 View  Result Date: 11/26/2017 CLINICAL DATA:  Weakness EXAM: CHEST  2 VIEW COMPARISON:  07/11/2017 chest radiograph. FINDINGS: Stable cardiomediastinal silhouette with normal heart size. No pneumothorax. No pleural  effusion. Lungs appear clear, with no acute consolidative airspace disease and no pulmonary edema. IMPRESSION: No active cardiopulmonary disease. Electronically Signed   By: Delbert Phenix M.D.   On: 11/26/2017 17:27   Ct Head Wo Contrast  Result Date: 11/26/2017 CLINICAL DATA:  Syncopal episodes, generalized weakness, focal neural deficit of greater than 6 hours, history of HIV, cryptococcal meningitis, hypertension EXAM: CT HEAD WITHOUT CONTRAST TECHNIQUE: Contiguous axial images were obtained from the base of the skull through the vertex without intravenous contrast. Sagittal and coronal MPR images reconstructed from axial data set. COMPARISON:  06/24/2017 FINDINGS: Brain: Exam degraded by patient motion. Generalized atrophy. Normal ventricular morphology. No midline shift or mass effect. Scattered areas of white matter hypoattenuation particularly frontal regions. No intracranial hemorrhage, mass lesion, or evidence acute infarction. No extra-axial fluid collections. Vascular: Unremarkable Skull: Unremarkable Sinuses/Orbits: Clear Other: N/A IMPRESSION: White matter hypoattenuation in the frontal regions greater on RIGHT similar prior exam. No new intracranial abnormalities. Electronically Signed   By: Ulyses Southward M.D.   On: 11/26/2017 17:05   Dg Fluoro Guide Lumbar Puncture  Result Date: 12/04/2017 CLINICAL DATA:  AIDS.  Cryptococcal meningitis. EXAM: DIAGNOSTIC LUMBAR PUNCTURE UNDER FLUOROSCOPIC GUIDANCE FLUOROSCOPY TIME:  Fluoroscopy Time: 36 sec of low-dose pulsed fluoro Radiation Exposure Index (if provided by the fluoroscopic device): 1.85 mGy Number of Acquired Spot Images: 0 PROCEDURE: Informed witnessed consent was obtained from the patient's sister, Sharin Mons, by telephone prior to the procedure, including potential complications of headache, allergy, and pain. With the patient prone, the lower back was prepped with Betadine. 1% Lidocaine was used for local anesthesia. Lumbar puncture was  performed at the L3-4 level on the right using a 20 gauge needle with return of slightly amber tinged CSF with an opening pressure of 12 cm water. 6 ml of  CSF were obtained for laboratory studies. The patient tolerated the procedure well and there were no apparent complications. IMPRESSION: Diagnostic lumbar puncture performed without immediate complications. Electronically Signed   By: Carey Bullocks M.D.   On: 12/04/2017 11:15   Dg Fluoro Guide Lumbar Puncture  Result Date: 11/27/2017 CLINICAL DATA:  AIDS.  Evaluate for cryptococcal meningitis. EXAM: DIAGNOSTIC LUMBAR PUNCTURE UNDER FLUOROSCOPIC GUIDANCE FLUOROSCOPY TIME:  Fluoroscopy Time:  1 minute 14 seconds Radiation Exposure Index (if provided by the fluoroscopic device): 10 mGy Number of Acquired Spot Images: 1 PROCEDURE: Informed consent was obtained from the patient prior to the procedure, including potential complications of headache, allergy, and pain. With the patient prone, the lower back was prepped with Betadine. 1% Lidocaine was used for local anesthesia. Lumbar puncture was performed at the L2-3 level using a 20 gauge needle with return of clear CSF with an opening pressure of 36 cm water. 9.5 ml of CSF were obtained for laboratory studies. The patient tolerated the procedure well and there were no apparent complications. IMPRESSION: 1. Successful lumbar puncture at L2-3. 2. Opening pressure of 36 cm water.  9.5 cc of CSF collected. Electronically Signed   By: Signa Kell M.D.   On: 11/27/2017 15:43     LOS: 9 days   Signature  Susa Raring M.D on 12/06/2017 at 10:41 AM  Between 7am to 7pm - Pager - [phone] ( page via [email or web address], text pages only, please mention full 10 digit call back number).  After 7pm go to [email or web address] - password [email or web address]

## 2017-12-07 LAB — COMPREHENSIVE METABOLIC PANEL
ALBUMIN: 2.6 g/dL — AB (ref 3.5–5.0)
ALK PHOS: 104 U/L (ref 38–126)
ALT: 102 U/L — AB (ref 17–63)
ANION GAP: 4 — AB (ref 5–15)
AST: 57 U/L — ABNORMAL HIGH (ref 15–41)
BUN: 24 mg/dL — ABNORMAL HIGH (ref 6–20)
CALCIUM: 8 mg/dL — AB (ref 8.9–10.3)
CHLORIDE: 103 mmol/L (ref 101–111)
CO2: 22 mmol/L (ref 22–32)
Creatinine, Ser: 1.12 mg/dL (ref 0.61–1.24)
GFR calc Af Amer: >60 mL/min (ref >60)
GFR calc non Af Amer: >60 mL/min (ref >60)
GLUCOSE: 91 mg/dL (ref 65–99)
Potassium: 4.1 mmol/L (ref 3.5–5.1)
SODIUM: 129 mmol/L — AB (ref 135–145)
Total Bilirubin: 0.4 mg/dL (ref 0.3–1.2)
Total Protein: 6.6 g/dL (ref 6.5–8.1)

## 2017-12-07 LAB — MAGNESIUM: MAGNESIUM: 1.8 mg/dL (ref 1.7–2.4)

## 2017-12-07 MED ORDER — LACTATED RINGERS IV SOLN
INTRAVENOUS | Status: DC
  Administered 2017-12-07: 15:00:00 via INTRAVENOUS

## 2017-12-07 MED ORDER — DEXTROSE 5% FOR FLUSHING BEFORE AND AFTER AMBISOME
10.0000 mL | INTRAVENOUS | Status: DC
  Administered 2017-12-07 – 2017-12-21 (×14): 10 mL via INTRAVENOUS
  Filled 2017-12-07: qty 10
  Filled 2017-12-07 (×2): qty 50
  Filled 2017-12-07: qty 10
  Filled 2017-12-07 (×2): qty 50
  Filled 2017-12-07: qty 10
  Filled 2017-12-07: qty 50
  Filled 2017-12-07: qty 10
  Filled 2017-12-07 (×2): qty 50
  Filled 2017-12-07 (×4): qty 10
  Filled 2017-12-07 (×2): qty 50
  Filled 2017-12-07 (×2): qty 10

## 2017-12-07 MED ORDER — MAGNESIUM SULFATE IN D5W 1-5 GM/100ML-% IV SOLN
1.0000 g | Freq: Once | INTRAVENOUS | Status: AC
  Administered 2017-12-07: 1 g via INTRAVENOUS
  Filled 2017-12-07 (×2): qty 100

## 2017-12-07 NOTE — Progress Notes (Signed)
Physical Therapy Treatment Patient Details Name: Kenneth Rivas MRN: [id number] DOB: February 20, 1961 Today's Date: 12/07/2017    History of Present Illness 57 yo male admitted with weakness, near syncopal episode.  Dx:  cryptococcal meningoencephalitis.  Hx of HIV/AIDS, Hep B, drug abuse, noncompliance, cachexia, anemia.     PT Comments    Much improved participation and activity tolerance today. Pt ambulated 12' x 2 with RW, mod assist for balance while walking. Assistance for mobility recommended as pt has unsteady gait and difficulty maneuvering RW around obstacles. He is a high fall risk.    Follow Up Recommendations  SNF; assist for mobility     Equipment Recommendations  None recommended by PT    Recommendations for Other Services       Precautions / Restrictions Precautions Precautions: Fall Precaution Comments: unsteady gait Restrictions Weight Bearing Restrictions: No    Mobility  Bed Mobility   Bed Mobility: Supine to Sit;Sit to Supine     Supine to sit: Supervision;HOB elevated Sit to supine: Supervision;HOB elevated   General bed mobility comments: HOB up, used rail  Transfers Overall transfer level: Needs assistance Equipment used: Rolling walker (2 wheeled) Transfers: Sit to/from Stand Sit to Stand: Min assist         General transfer comment: Min A to rise and steady, VCs hand placement  Ambulation/Gait Ambulation/Gait assistance: Mod assist Ambulation Distance (Feet): 24 Feet(12' x 2) Assistive device: Rolling walker (2 wheeled) Gait Pattern/deviations: Step-to pattern;Trunk flexed;Staggering left;Staggering right;Decreased stride length   Gait velocity interpretation: Below normal speed for age/gender General Gait Details: mod A for balance and to maneuver RW around obstacles, VCs for positioning in RW   Stairs            Wheelchair Mobility    Modified Rankin (Stroke Patients Only)       Balance   Sitting-balance support:  Bilateral upper extremity supported;Feet supported Sitting balance-Leahy Scale: Fair     Standing balance support: Bilateral upper extremity supported Standing balance-Leahy Scale: Poor Standing balance comment: reliant upon BUE support                            Cognition Arousal/Alertness: Awake/alert Behavior During Therapy: WFL for tasks assessed/performed Overall Cognitive Status: Within Functional Limits for tasks assessed                                 General Comments: very HOH      Exercises      General Comments        Pertinent Vitals/Pain Pain Assessment: No/denies pain    Home Living                      Prior Function            PT Goals (current goals can now be found in the care plan section) Acute Rehab PT Goals Patient Stated Goal: none stated PT Goal Formulation: With patient Time For Goal Achievement: 12/11/17 Potential to Achieve Goals: Fair Progress towards PT goals: Progressing toward goals    Frequency    Min 2X/week      PT Plan Current plan remains appropriate    Co-evaluation              AM-PAC PT "6 Clicks" Daily Activity  Outcome Measure  Difficulty turning over in bed (including adjusting bedclothes, sheets and  blankets)?: A Little Difficulty moving from lying on back to sitting on the side of the bed? : A Little Difficulty sitting down on and standing up from a chair with arms (e.g., wheelchair, bedside commode, etc,.)?: Unable Help needed moving to and from a bed to chair (including a wheelchair)?: A Little Help needed walking in hospital room?: A Lot Help needed climbing 3-5 steps with a railing? : Total 6 Click Score: 13    End of Session Equipment Utilized During Treatment: Gait belt Activity Tolerance: Patient tolerated treatment well Patient left: in bed;with call bell/phone within reach;with bed alarm set Nurse Communication: Mobility status PT Visit Diagnosis: Muscle  weakness (generalized) (M62.81);Difficulty in walking, not elsewhere classified (R26.2)     Time: 1210-1229 PT Time Calculation (min) (ACUTE ONLY): 19 min  Charges:  $Gait Training: 8-22 mins                    G Codes:          Tamala Ser 12/07/2017, 12:36 PM [phone]

## 2017-12-07 NOTE — Progress Notes (Signed)
PHARMACY BRIEF NOTE:   ELECTROLYTE REPLACEMENT PER LIPSOMAL AMPHOTERICIN B PROTOCOL  Ambisome 150 mg (dose reduced to 3 mg/kg per ID d/t renal insuff.) IV q24h in combination with flucytosine 1250 mg (25 mg/kg) PO q6h  for cryptococcal meningitis.   ID service was consulted and is actively following.  Recent Labs    12/06/17 0813 12/07/17 0458  NA 127* 129*  K 4.7 4.1  CL 100* 103  CO2 21* 22  GLUCOSE 89 91  BUN 30* 24*  CREATININE 1.45* 1.12  CALCIUM 8.3* 8.0*  MG 2.3 1.8  ALBUMIN  --  2.6*  ALKPHOS  --  104  AST  --  57*  ALT  --  102*  BILITOT  --  0.4    Assessment: - K at goal with 4.1 (goal > 4) - Mag 1.8  - SCr down 1.12, UOP 4.1 ml/kh/hr   Plan, per Amphotericin B Protocol: - Mag Sulfate 1 grams IV x 1 this AM - Continue NS 1000 mL (increased from 500 mg) bolus IV before and after each infusion of Ambisome  - Continue daily BMet, Mg - Monitor SCr, UOP  Dorna LeitzAnh Timm Bonenberger, PharmD, BCPS 12/07/2017 11:02 AM

## 2017-12-07 NOTE — Progress Notes (Signed)
PROGRESS NOTE        PATIENT DETAILS Name: Kenneth Rivas Age: 57 y.o. Sex: male Date of Birth: [birth year] Admit Date: 11/26/2017 Admitting Physician Haydee Salter, MD PCP:Van Dam, Lisette Grinder, MD  Brief Narrative: Patient is a 57 y.o. male with prior history of HIV/AIDS-last CD4 count less than 10, history of cryptococcal meningitis in 2018 (not very compliant with antifungals) admitted with generalized weakness, near syncopal event. Patient was noted to have a prolonged QTC, and was also found to have recurrent/persistent cryptococcal meningitis and admitted to the hospitalist service. See below for further details  Subjective:  Patient in bed, appears comfortable, no photophobia or headache, no fever, no chest pain or pressure, no shortness of breath , no abdominal pain. No focal weakness.  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  Continues to deny headaches.  Status post repeat lumbar puncture on 12/04/2017 results awaited however preliminary results of Gram stain consistent with fungal presence.  ID on board and following. He remains quite noncompliant with his medications, per nursing staff is throwing his pills under the bed on a consistent basis, counseled him and updated his sister over the phone.  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   ARF.  Could be due to acyclovir, increased IV fluids, hold ACE inhibitor and Lasix.  Skip Bactrim for the next 2 days, ARF has resolved we will continue to monitor.    HIV/AIDS: Per infectious disease-this is very poorly controlled-ART's has  been deferred in the setting of a redo induction for cryptococcal meningitis.  He remains on Bactrim for PCP prophylaxis, given prolonged QTC-he remains off Zithromax for MAI prophylaxis.  ID following.  He remains quite noncompliant with his medications, per nursing staff is throwing his pills under the bed on a consistent basis, counseled him and updated his sister over the phone.  Near syncope: Probably secondary to dehydration/orthostatic mechanism. 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 Cardiology consulted-with no further recommendations at this time.  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  Will need outpatient follow-up with cardiology.  QT prolongation: Resolved-avoid QT prolonging agents as much as possible.  Severe protein calorie malnutrition: Continue supplements  Hypertension: Controlled with Coreg, lisinopril and Lasix.  Follow and adjust accordingly.  Anemia/leukopenia: Main stable-likely secondary to advanced HIV.  Continue to follow CBC periodically.       DVT Prophylaxis: Prophylactic Lovenox   Code Status: Full code  Family Communication: Discussed with sister x2 over the phone  Disposition Plan: Likely discharge in the next 1-2 days social work requested to look for appropriate placement on 12/07/2017  Antimicrobial agents: Anti-infectives (From admission, onward)   Start     Dose/Rate Route Frequency Ordered Stop   12/10/17 1000  sulfamethoxazole-trimethoprim (BACTRIM DS,SEPTRA DS) 800-160 MG per tablet 1 tablet     1 tablet Oral Daily 12/06/17 1039     12/06/17 1800  amphotericin B liposome (AMBISOME) 150 mg in dextrose 5 % 500  mL IVPB     3 mg/kg  49.6 kg 250 mL/hr over 120 Minutes Intravenous Every 24 hours 12/06/17 1147     12/04/17 1730  amphotericin B liposome (AMBISOME)  200 mg in dextrose 5 % 500 mL IVPB  Status:  Discontinued     200 mg 250 mL/hr over 120 Minutes Intravenous Every 24 hours 12/04/17 1350 12/06/17 1147   11/27/17 1800  flucytosine (ANCOBON) capsule 1,250 mg     25 mg/kg  47 kg Oral Every 6 hours 11/27/17 1615     11/27/17 1730  amphotericin B liposome (AMBISOME) 240 mg in dextrose 5 % 500 mL IVPB  Status:  Discontinued     5 mg/kg  47 kg 250 mL/hr over 120 Minutes Intravenous Every 24 hours 11/27/17 1615 12/04/17 1350   11/27/17 1000  sulfamethoxazole-trimethoprim (BACTRIM DS,SEPTRA DS) 800-160 MG per tablet 1 tablet  Status:  Discontinued     1 tablet Oral Daily 11/27/17 0839 12/06/17 1039      Procedures: 1/11>> fluoroscopy guided lumbar puncture-opening pressure of 36 cm of water.  CONSULTS:  ID  Time spent: 25 minutes-Greater than 50% of this time was spent in counseling, explanation of diagnosis, planning of further management, and coordination of care.  MEDICATIONS: Scheduled Meds: . carvedilol  3.125 mg Oral BID WC  . dextrose  10 mL Intravenous Q24H  . docusate  25 mg Both EARS BID  . enoxaparin (LOVENOX) injection  40 mg Subcutaneous Q24H  . feeding supplement (ENSURE ENLIVE)  237 mL Oral TID BM  . feeding supplement (PRO-STAT SUGAR FREE 64)  30 mL Oral TID WC  . flucytosine  25 mg/kg Oral Q6H  . megestrol  40 mg Oral Daily  . multivitamin with minerals  1 tablet Oral Daily  . sodium chloride  1,000 mL Intravenous Q24H  . sodium chloride  1,000 mL Intravenous Q24H  . sodium chloride flush  3 mL Intravenous Q12H  . [START ON 12/10/2017] sulfamethoxazole-trimethoprim  1 tablet Oral Daily   Continuous Infusions: . amphotericin  B  Liposome (AMBISOME) ADULT IV Stopped (12/06/17 2224)  . lactated ringers     PRN Meds:.acetaminophen **OR** [DISCONTINUED] acetaminophen, hydrALAZINE, meperidine (DEMEROL) injection   PHYSICAL EXAM: Vital signs: Vitals:   12/06/17 2032 12/06/17 2230 12/07/17 0523 12/07/17 0838  BP:  (!) 137/106 139/82 (!) 148/96 126/79  Pulse: 74  70 84  Resp: 20  18   Temp: 98.8 F (37.1 C)  98 F (36.7 C)   TempSrc: Oral  Oral   SpO2: 100%  100%   Weight:      Height:       Filed Weights   12/02/17 0357 12/05/17 0500 12/06/17 0500  Weight: 48.9 kg (107 lb 12.9 oz) 47.7 kg (105 lb 2.6 oz) 49.6 kg (109 lb 5.6 oz)   Body mass index is 17.65 kg/m.   Exam  Awake Alert, Oriented X 3, No new F.N deficits, Normal affect [place].AT,PERRAL Supple Neck,No JVD, No cervical lymphadenopathy appriciated.  Symmetrical Chest wall movement, Good air movement bilaterally, CTAB RRR,No Gallops, Rubs or new Murmurs, No Parasternal Heave +ve B.Sounds, Abd Soft, No tenderness, No organomegaly appriciated, No rebound - guarding or rigidity. No Cyanosis, Clubbing or edema, No new Rash or bruise  I have personally reviewed following labs and imaging studies  LABORATORY DATA: CBC: Recent Labs  Lab 12/01/17 0522 12/02/17 0421 12/04/17 0423 12/06/17 0813  WBC 2.4* 2.2* 3.5* 2.8*  NEUTROABS 1.5*  --   --   --  HGB 9.7* 9.5* 9.1* 8.4*  HCT 29.3* 28.5* 27.3* 24.8*  MCV 82.8 81.9 81.3 80.0  PLT 104* 116* 159 182    Basic Metabolic Panel: Recent Labs  Lab 12/03/17 0429 12/04/17 0423 12/05/17 0434 12/06/17 0813 12/07/17 0458  NA 126* 128* 127* 127* 129*  K 4.5 4.8 4.4 4.7 4.1  CL 98* 98* 98* 100* 103  CO2 23 25 24  21* 22  GLUCOSE 90 95 90 89 91  BUN 19 24* 27* 30* 24*  CREATININE 0.78 1.07 1.23 1.45* 1.12  CALCIUM 8.3* 8.5* 8.3* 8.3* 8.0*  MG 2.1 1.8 2.0 2.3 1.8    GFR: Estimated Creatinine Clearance: 50.4 mL/min (by C-G formula based on SCr of 1.12 mg/dL).  Liver Function Tests: Recent Labs  Lab 12/07/17 0458  AST 57*  ALT 102*  ALKPHOS 104  BILITOT 0.4  PROT 6.6  ALBUMIN 2.6*   No results for input(s): LIPASE, AMYLASE in the last 168 hours. No results for input(s): AMMONIA in the last 168 hours.  Coagulation Profile: No results for input(s): INR, PROTIME in the  last 168 hours.  Cardiac Enzymes: No results for input(s): CKTOTAL, CKMB, CKMBINDEX, TROPONINI in the last 168 hours.  BNP (last 3 results) No results for input(s): PROBNP in the last 8760 hours.  HbA1C: No results for input(s): HGBA1C in the last 72 hours.  CBG: No results for input(s): GLUCAP in the last 168 hours.  Lipid Profile: No results for input(s): CHOL, HDL, LDLCALC, TRIG, CHOLHDL, LDLDIRECT in the last 72 hours.  Thyroid Function Tests: No results for input(s): TSH, T4TOTAL, FREET4, T3FREE, THYROIDAB in the last 72 hours.  Anemia Panel: No results for input(s): VITAMINB12, FOLATE, FERRITIN, TIBC, IRON, RETICCTPCT in the last 72 hours.  Urine analysis:    Component Value Date/Time   COLORURINE YELLOW 11/30/2017 1150   APPEARANCEUR CLEAR 11/30/2017 1150   LABSPEC 1.009 11/30/2017 1150   PHURINE 8.0 11/30/2017 1150   GLUCOSEU 50 (A) 11/30/2017 1150   HGBUR NEGATIVE 11/30/2017 1150   BILIRUBINUR NEGATIVE 11/30/2017 1150   KETONESUR NEGATIVE 11/30/2017 1150   PROTEINUR NEGATIVE 11/30/2017 1150   NITRITE NEGATIVE 11/30/2017 1150   LEUKOCYTESUR NEGATIVE 11/30/2017 1150    Sepsis Labs: Lactic Acid, Venous    Component Value Date/Time   LATICACIDVEN 2.03 (HH) 06/16/2017 1924    MICROBIOLOGY: Recent Results (from the past 240 hour(s))  CSF culture     Status: None   Collection Time: 11/27/17  3:33 PM  Result Value Ref Range Status   Specimen Description CSF  Final   Special Requests NONE  Final   Gram Stain   Final    WBC PRESENT, PREDOMINANTLY MONONUCLEAR YEAST PRESENT CYTOSPIN SMEAR CRITICAL RESULT CALLED TO, READ BACK BY AND VERIFIED WITH: R.CORCORAN AT 1752 ON 11/27/17 BY N.THOMPSON    Culture ABUNDANT CRYPTOCOCCUS NEOFORMANS  Final   Report Status 11/30/2017 FINAL  Final  Fungus Culture With Stain     Status: None   Collection Time: 11/27/17  3:33 PM  Result Value Ref Range Status   Fungus Stain Final report  Final   Fungus (Mycology) Culture  Preliminary report  Final    Comment: (NOTE) Performed At: [facility] [place] [address] [place], [place] [id number] Jolene SchimkeNagendra Sanjai MD WG:[id number]:[phone]    Fungal Source CSF  Final  Culture, fungus without smear     Status: Abnormal (Preliminary result)   Collection Time: 11/27/17  3:33 PM  Result Value Ref Range Status   Specimen Description CSF  Final   Special Requests PENDING  Incomplete   Culture CRYPTOCOCCUS NEOFORMANS (A)  Final   Report Status PENDING  Incomplete  Fungus Culture Result     Status: None   Collection Time: 11/27/17  3:33 PM  Result Value Ref Range Status   Result 1 Comment  Final    Comment: (NOTE) Fungal elements, such as arthroconidia, hyphal fragments, chlamydoconidia, observed. Performed At: [facility] [address] [place], [place] [id number] Jolene Schimke MD YN:[id number]   Fungal organism reflex     Status: None   Collection Time: 11/27/17  3:33 PM  Result Value Ref Range Status   Fungal result 1 Comment  Final    Comment: (NOTE) Yeast isolated, identification in progress. Identification to follow. NOTIFIED/FAXED ACE Q. AT 0837 ON [id number].(BL) FAX [phone] Performed At: [facility] [address] [place], [place] [id number] Jolene Schimke MD DG:[id number]   Culture, Urine     Status: None   Collection Time: 11/30/17 11:50 AM  Result Value Ref Range Status   Specimen Description URINE, CLEAN CATCH  Final   Special Requests NONE  Final   Culture   Final    NO GROWTH Performed at [facility] [place] [facility], [address] [place], [place] [address]    Report Status 12/02/2017 FINAL  Final  CSF culture     Status: None (Preliminary result)   Collection Time: 12/04/17 10:28 AM  Result Value Ref Range Status   Specimen Description CSF  Final   Special Requests NONE  Final   Gram Stain   Final    WBC PRESENT, PREDOMINANTLY MONONUCLEAR YEAST PRESENT CYTOSPIN SMEAR CRITICAL RESULT CALLED TO, READ BACK BY AND VERIFIED  WITH: POWERS,A @ 1209 ON [id number] BY POTEAT,S    Culture   Final    FEW YEAST IDENTIFICATION TO FOLLOW Performed at [facility], [address] [place], [place] [address]    Report Status PENDING  Incomplete    RADIOLOGY STUDIES/RESULTS: Dg Chest 2 View  Result Date: 11/26/2017 CLINICAL DATA:  Weakness EXAM: CHEST  2 VIEW COMPARISON:  07/11/2017 chest radiograph. FINDINGS: Stable cardiomediastinal silhouette with normal heart size. No pneumothorax. No pleural effusion. Lungs appear clear, with no acute consolidative airspace disease and no pulmonary edema. IMPRESSION: No active cardiopulmonary disease. Electronically Signed   By: Delbert Phenix M.D.   On: 11/26/2017 17:27   Ct Head Wo Contrast  Result Date: 11/26/2017 CLINICAL DATA:  Syncopal episodes, generalized weakness, focal neural deficit of greater than 6 hours, history of HIV, cryptococcal meningitis, hypertension EXAM: CT HEAD WITHOUT CONTRAST TECHNIQUE: Contiguous axial images were obtained from the base of the skull through the vertex without intravenous contrast. Sagittal and coronal MPR images reconstructed from axial data set. COMPARISON:  06/24/2017 FINDINGS: Brain: Exam degraded by patient motion. Generalized atrophy. Normal ventricular morphology. No midline shift or mass effect. Scattered areas of white matter hypoattenuation particularly frontal regions. No intracranial hemorrhage, mass lesion, or evidence acute infarction. No extra-axial fluid collections. Vascular: Unremarkable Skull: Unremarkable Sinuses/Orbits: Clear Other: N/A IMPRESSION: White matter hypoattenuation in the frontal regions greater on RIGHT similar prior exam. No new intracranial abnormalities. Electronically Signed   By: Ulyses Southward M.D.   On: 11/26/2017 17:05   Dg Fluoro Guide Lumbar Puncture  Result Date: 12/04/2017 CLINICAL DATA:  AIDS.  Cryptococcal meningitis. EXAM: DIAGNOSTIC LUMBAR PUNCTURE UNDER FLUOROSCOPIC GUIDANCE FLUOROSCOPY TIME:   Fluoroscopy Time: 36 sec of low-dose pulsed fluoro Radiation Exposure Index (if provided by the fluoroscopic device):  1.85 mGy Number of Acquired Spot Images: 0 PROCEDURE: Informed witnessed consent was obtained from the patient's sister, Sharin Mons, by telephone prior to the procedure, including potential complications of headache, allergy, and pain. With the patient prone, the lower back was prepped with Betadine. 1% Lidocaine was used for local anesthesia. Lumbar puncture was performed at the L3-4 level on the right using a 20 gauge needle with return of slightly amber tinged CSF with an opening pressure of 12 cm water. 6 ml of CSF were obtained for laboratory studies. The patient tolerated the procedure well and there were no apparent complications. IMPRESSION: Diagnostic lumbar puncture performed without immediate complications. Electronically Signed   By: Carey Bullocks M.D.   On: 12/04/2017 11:15   Dg Fluoro Guide Lumbar Puncture  Result Date: 11/27/2017 CLINICAL DATA:  AIDS.  Evaluate for cryptococcal meningitis. EXAM: DIAGNOSTIC LUMBAR PUNCTURE UNDER FLUOROSCOPIC GUIDANCE FLUOROSCOPY TIME:  Fluoroscopy Time:  1 minute 14 seconds Radiation Exposure Index (if provided by the fluoroscopic device): 10 mGy Number of Acquired Spot Images: 1 PROCEDURE: Informed consent was obtained from the patient prior to the procedure, including potential complications of headache, allergy, and pain. With the patient prone, the lower back was prepped with Betadine. 1% Lidocaine was used for local anesthesia. Lumbar puncture was performed at the L2-3 level using a 20 gauge needle with return of clear CSF with an opening pressure of 36 cm water. 9.5 ml of CSF were obtained for laboratory studies. The patient tolerated the procedure well and there were no apparent complications. IMPRESSION: 1. Successful lumbar puncture at L2-3. 2. Opening pressure of 36 cm water.  9.5 cc of CSF collected. Electronically Signed   By: Signa Kell M.D.   On: 11/27/2017 15:43     LOS: 10 days   Signature  Susa Raring M.D on 12/07/2017 at 12:22 PM  Between 7am to 7pm - Pager - [phone] ( page via [email or web address], text pages only, please mention full 10 digit call back number).  After 7pm go to [email or web address] - password [email or web address]

## 2017-12-07 NOTE — Progress Notes (Signed)
[facility]    Date of Admission:  11/26/2017   Total days of antibiotics 11        Day 11 liposomal ampho/flucytosine        Day 11 bactrim proph           ID: Jacolyn Reedylonzo Owusu is a 57 y.o. male with  Advanced poorly controlled hiv disease, CD 4 count <10/VL 56,200 with CSF Cr Ag1:>2560 Principal Problem:   Cryptococcal meningoencephalitis (HCC) Active Problems:   HTN (hypertension)   Cachexia associated with AIDS (HCC)   Hypokalemia   Hyponatremia   Protein-calorie malnutrition, severe   AIDS (acquired immune deficiency syndrome) (HCC)   Prolonged QT interval   Near syncope   Cryptococcal meningitis (HCC)   Anemia   Abnormal echocardiogram   Cardiomyopathy [facility](HCC): Per 2 d echo 11/28/2017    Subjective: Appears more alert this afternoon, having chicken noodle soup and coffee for a snack. Denies headache.  Medications:  . carvedilol  3.125 mg Oral BID WC  . dextrose  10 mL Intravenous Q24H  . dextrose  10 mL Intravenous Q24H  . docusate  25 mg Both EARS BID  . enoxaparin (LOVENOX) injection  40 mg Subcutaneous Q24H  . feeding supplement (ENSURE ENLIVE)  237 mL Oral TID BM  . feeding supplement (PRO-STAT SUGAR FREE 64)  30 mL Oral TID WC  . flucytosine  25 mg/kg Oral Q6H  . megestrol  40 mg Oral Daily  . multivitamin with minerals  1 tablet Oral Daily  . sodium chloride  1,000 mL Intravenous Q24H  . sodium chloride  1,000 mL Intravenous Q24H  . sodium chloride flush  3 mL Intravenous Q12H  . [START ON 12/10/2017] sulfamethoxazole-trimethoprim  1 tablet Oral Daily    Objective: Vital signs in last 24 hours: Temp:  [97.9 F (36.6 C)-98.8 F (37.1 C)] 97.9 F (36.6 C) (01/21 1421) Pulse Rate:  [70-84] 82 (01/21 1421) Resp:  [16-20] 16 (01/21 1421) BP: (125-148)/(79-106) 125/82 (01/21 1421) SpO2:  [98 %-100 %] 98 % (01/21 1421)  Physical Exam  Constitutional: He is oriented to person, place, and time. He appears chronically ill,  disheveled. No distress.  HENT:  Mouth/Throat: Oropharynx is clear and moist. No oropharyngeal exudate.  Cardiovascular: Normal rate, regular rhythm and normal heart sounds. Exam reveals no gallop and no friction rub.  No murmur heard.  Pulmonary/Chest: Effort normal and breath sounds normal. No respiratory distress. He has no wheezes.  Abdominal: Soft. Bowel sounds are normal. He exhibits no distension. There is no tenderness.  Lymphadenopathy:  He has no cervical adenopathy.  Neurological: He is alert and oriented to person, place, and time.  Skin: Skin is warm and dry. No rash noted. No erythema.  Psychiatric: He has a normal mood and affect. His behavior is normal.     Lab Results Recent Labs    12/06/17 0813 12/07/17 0458  WBC 2.8*  --   HGB 8.4*  --   HCT 24.8*  --   NA 127* 129*  K 4.7 4.1  CL 100* 103  CO2 21* 22  BUN 30* 24*  CREATININE 1.45* 1.12   Liver Panel Recent Labs    12/07/17 0458  PROT 6.6  ALBUMIN 2.6*  AST 57*  ALT 102*  ALKPHOS 104  BILITOT 0.4    Microbiology: 1/18 CSF cx + smear but cx still pending. 1/11 CSF cx + growth of cryptococcal neoformans on 1/14 Studies/Results: No results found.  Assessment/Plan: reinduction for cryptococcal meningitis = continue on liposomal ampho dosed at 3mg /kg with IL NS pre and post infusion. Continues to tolerate flucytosine at current dose. aki improved today. He will need a minimum of 14 days of induction, likely longer since we want to make sure that his repeat CSF cultures are sterile before converting to oral fluconazole. Recommend to keep his current regimen through the end of the week. Get repeat LP on 1/25 or 1/28.   aki = likely due to ampho nephrotoxicity plus/minus other renal drugs like lasix, acyclovir. He is improved today  hiv disease = post poning hiv treatment due til 5 wks or more of CM treatment to minimize IRIS risk  Severe malnutrition = continue with protein  supplementation.  Adherence = he was found having numerous pills in his pillow. Continue with watching him take meds with ice cream  oi proph = recommend giving him 1200mg  IV azithromycin every Tuesday, continue with bactrim ds daily  [facility] Cell: [phone] Pager: [phone]  12/07/2017, 4:49 PM

## 2017-12-08 LAB — CSF CULTURE W GRAM STAIN

## 2017-12-08 LAB — BASIC METABOLIC PANEL
Anion gap: 3 — ABNORMAL LOW (ref 5–15)
BUN: 15 mg/dL (ref 6–20)
CHLORIDE: 107 mmol/L (ref 101–111)
CO2: 23 mmol/L (ref 22–32)
CREATININE: 0.92 mg/dL (ref 0.61–1.24)
Calcium: 8 mg/dL — ABNORMAL LOW (ref 8.9–10.3)
GFR calc non Af Amer: >60 mL/min (ref >60)
Glucose, Bld: 91 mg/dL (ref 65–99)
POTASSIUM: 3.9 mmol/L (ref 3.5–5.1)
Sodium: 133 mmol/L — ABNORMAL LOW (ref 135–145)

## 2017-12-08 LAB — FUNGUS CULTURE RESULT

## 2017-12-08 LAB — MAGNESIUM: Magnesium: 1.6 mg/dL — ABNORMAL LOW (ref 1.7–2.4)

## 2017-12-08 LAB — FUNGAL ORGANISM REFLEX

## 2017-12-08 LAB — FUNGUS CULTURE WITH STAIN

## 2017-12-08 MED ORDER — MAGNESIUM SULFATE 2 GM/50ML IV SOLN
2.0000 g | Freq: Once | INTRAVENOUS | Status: AC
Start: 2017-12-08 — End: 2017-12-08
  Administered 2017-12-08: 2 g via INTRAVENOUS
  Filled 2017-12-08: qty 50

## 2017-12-08 MED ORDER — AZITHROMYCIN 600 MG PO TABS
1200.0000 mg | ORAL_TABLET | Freq: Once | ORAL | Status: AC
  Administered 2017-12-08: 1200 mg via ORAL
  Filled 2017-12-08: qty 2

## 2017-12-08 MED ORDER — LACTATED RINGERS IV SOLN
INTRAVENOUS | Status: AC
  Administered 2017-12-08: 09:00:00 via INTRAVENOUS

## 2017-12-08 NOTE — Progress Notes (Signed)
PROGRESS NOTE        PATIENT DETAILS Name: Kenneth Rivas Age: 57 y.o. Sex: male Date of Birth: 11-Jul-1961 Admit Date: 11/26/2017 Admitting Physician Haydee Salter, MD PCP:Van Dam, Lisette Grinder, MD  Brief Narrative: Patient is a 57 y.o. male with prior history of HIV/AIDS-last CD4 count less than 10, history of cryptococcal meningitis in 2018 (not very compliant with antifungals) admitted with generalized weakness, near syncopal event. Patient was noted to have a prolonged QTC, and was also found to have recurrent/persistent cryptococcal meningitis and admitted to the hospitalist service. See below for further details  Subjective:  Patient in bed, appears comfortable, denies any photophobia or headache, no fever, no chest pain or pressure, no shortness of breath , no abdominal pain. No focal weakness.  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  Continues to deny headaches.  Status post repeat lumbar puncture on 12/04/2017 results awaited however preliminary results of Gram stain consistent with fungal presence.  ID on board and following. He remains quite noncompliant with his medications, per nursing staff is throwing his pills under the bed on a consistent basis, counseled him and updated his sister over the phone.  Plan is to continue current treatment and repeat LP on 12/11/2017, thereafter per ID, patient lives in a group home and extremely noncompliant almost impossible to get him back for a timely LP.  Hyponatremia: now due to dehydration, improving after LR initiated, stop LR tomorrow once sodium is stable.   ARF.  Could be due to acyclovir, increased IV fluids, hold ACE inhibitor and Lasix.  Skip Bactrim for the next 2 days, ARF has resolved we will continue to monitor.    HIV/AIDS: Per infectious disease-this is very poorly controlled-ART's has been deferred in the setting of a redo  induction for cryptococcal meningitis.  He remains on Bactrim for PCP prophylaxis, given prolonged QTC-he remains off Zithromax for MAI prophylaxis.  ID following.  He remains quite noncompliant with his medications, per nursing staff is throwing his pills under the bed on a consistent basis, counseled him and updated his sister over the phone.  Near syncope: Probably secondary to dehydration/orthostatic mechanism. 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 Cardiology consulted-with no further recommendations at this time.  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  Will need outpatient follow-up with cardiology.  QT prolongation: Resolved-avoid QT prolonging agents as much as possible.  Severe protein calorie malnutrition: Continue supplements  Hypertension: Controlled with Coreg, lisinopril and Lasix.  Follow and adjust accordingly.  Anemia/leukopenia: Main stable-likely secondary to advanced HIV.  Continue to follow CBC periodically.       DVT Prophylaxis: Prophylactic Lovenox   Code Status: Full code  Family Communication: Discussed with sister x2 over the phone  Disposition Plan: Discharge after next LP on 12/11/2017, case management and social worker both requested to arrange for safe disposition.  Antimicrobial agents: Anti-infectives (From admission, onward)   Start     Dose/Rate Route Frequency Ordered Stop   12/10/17 1000  sulfamethoxazole-trimethoprim (BACTRIM DS,SEPTRA DS) 800-160 MG per tablet 1 tablet     1 tablet Oral Daily 12/06/17 1039     12/06/17 1800  amphotericin B liposome (AMBISOME) 150 mg in dextrose 5 % 500  mL IVPB     3 mg/kg  49.6 kg 250 mL/hr over 120 Minutes Intravenous Every 24 hours 12/06/17 1147     12/04/17 1730  amphotericin B liposome (AMBISOME) 200 mg in dextrose 5 %  500 mL IVPB  Status:  Discontinued     200 mg 250 mL/hr over 120 Minutes Intravenous Every 24 hours 12/04/17 1350 12/06/17 1147   11/27/17 1800  flucytosine (ANCOBON) capsule 1,250 mg     25 mg/kg  47 kg Oral Every 6 hours 11/27/17 1615     11/27/17 1730  amphotericin B liposome (AMBISOME) 240 mg in dextrose 5 % 500 mL IVPB  Status:  Discontinued     5 mg/kg  47 kg 250 mL/hr over 120 Minutes Intravenous Every 24 hours 11/27/17 1615 12/04/17 1350   11/27/17 1000  sulfamethoxazole-trimethoprim (BACTRIM DS,SEPTRA DS) 800-160 MG per tablet 1 tablet  Status:  Discontinued     1 tablet Oral Daily 11/27/17 0839 12/06/17 1039      Procedures: 1/11>> fluoroscopy guided lumbar puncture-opening pressure of 36 cm of water.  CONSULTS:  ID  Time spent: 25 minutes-Greater than 50% of this time was spent in counseling, explanation of diagnosis, planning of further management, and coordination of care.  MEDICATIONS: Scheduled Meds: . carvedilol  3.125 mg Oral BID WC  . dextrose  10 mL Intravenous Q24H  . dextrose  10 mL Intravenous Q24H  . docusate  25 mg Both EARS BID  . enoxaparin (LOVENOX) injection  40 mg Subcutaneous Q24H  . feeding supplement (ENSURE ENLIVE)  237 mL Oral TID BM  . feeding supplement (PRO-STAT SUGAR FREE 64)  30 mL Oral TID WC  . flucytosine  25 mg/kg Oral Q6H  . megestrol  40 mg Oral Daily  . multivitamin with minerals  1 tablet Oral Daily  . sodium chloride  1,000 mL Intravenous Q24H  . sodium chloride  1,000 mL Intravenous Q24H  . sodium chloride flush  3 mL Intravenous Q12H  . [START ON 12/10/2017] sulfamethoxazole-trimethoprim  1 tablet Oral Daily   Continuous Infusions: . amphotericin  B  Liposome (AMBISOME) ADULT IV Stopped (12/07/17 2019)  . lactated ringers 75 mL/hr at 12/08/17 0903  . magnesium sulfate 1 - 4 g bolus IVPB     PRN Meds:.acetaminophen **OR** [DISCONTINUED] acetaminophen, hydrALAZINE, meperidine (DEMEROL) injection   PHYSICAL EXAM: Vital  signs: Vitals:   12/07/17 2123 12/08/17 0428 12/08/17 0807 12/08/17 0855  BP: 104/86 115/88 138/86 (!) 154/99  Pulse: 72 61 67 72  Resp: 18 20    Temp: 97.8 F (36.6 C) 97.8 F (36.6 C)    TempSrc: Oral Oral    SpO2: 100% 96%    Weight:  49.3 kg (108 lb 11 oz)    Height:       Filed Weights   12/05/17 0500 12/06/17 0500 12/08/17 0428  Weight: 47.7 kg (105 lb 2.6 oz) 49.6 kg (109 lb 5.6 oz) 49.3 kg (108 lb 11 oz)   Body mass index is 17.54 kg/m.   Exam  Awake Alert, Oriented X 3, No new F.N deficits, Normal affect [place].AT,PERRAL Supple Neck,No JVD, No cervical lymphadenopathy appriciated.  Symmetrical Chest wall movement, Good air movement bilaterally, CTAB RRR,No Gallops, Rubs or new Murmurs, No Parasternal Heave +ve B.Sounds, Abd Soft, No tenderness, No organomegaly appriciated, No rebound - guarding or rigidity. No Cyanosis, Clubbing or edema, No new Rash or bruise  I have personally reviewed following labs and imaging studies  LABORATORY DATA: CBC: Recent  Labs  Lab 12/02/17 0421 12/04/17 0423 12/06/17 0813  WBC 2.2* 3.5* 2.8*  HGB 9.5* 9.1* 8.4*  HCT 28.5* 27.3* 24.8*  MCV 81.9 81.3 80.0  PLT 116* 159 182    Basic Metabolic Panel: Recent Labs  Lab 12/04/17 0423 12/05/17 0434 12/06/17 0813 12/07/17 0458 12/08/17 0443  NA 128* 127* 127* 129* 133*  K 4.8 4.4 4.7 4.1 3.9  CL 98* 98* 100* 103 107  CO2 25 24 21* 22 23  GLUCOSE 95 90 89 91 91  BUN 24* 27* 30* 24* 15  CREATININE 1.07 1.23 1.45* 1.12 0.92  CALCIUM 8.5* 8.3* 8.3* 8.0* 8.0*  MG 1.8 2.0 2.3 1.8 1.6*    GFR: Estimated Creatinine Clearance: 61 mL/min (by C-G formula based on SCr of 0.92 mg/dL).  Liver Function Tests: Recent Labs  Lab 12/07/17 0458  AST 57*  ALT 102*  ALKPHOS 104  BILITOT 0.4  PROT 6.6  ALBUMIN 2.6*   No results for input(s): LIPASE, AMYLASE in the last 168 hours. No results for input(s): AMMONIA in the last 168 hours.  Coagulation Profile: No results for  input(s): INR, PROTIME in the last 168 hours.  Cardiac Enzymes: No results for input(s): CKTOTAL, CKMB, CKMBINDEX, TROPONINI in the last 168 hours.  BNP (last 3 results) No results for input(s): PROBNP in the last 8760 hours.  HbA1C: No results for input(s): HGBA1C in the last 72 hours.  CBG: No results for input(s): GLUCAP in the last 168 hours.  Lipid Profile: No results for input(s): CHOL, HDL, LDLCALC, TRIG, CHOLHDL, LDLDIRECT in the last 72 hours.  Thyroid Function Tests: No results for input(s): TSH, T4TOTAL, FREET4, T3FREE, THYROIDAB in the last 72 hours.  Anemia Panel: No results for input(s): VITAMINB12, FOLATE, FERRITIN, TIBC, IRON, RETICCTPCT in the last 72 hours.  Urine analysis:    Component Value Date/Time   COLORURINE YELLOW 11/30/2017 1150   APPEARANCEUR CLEAR 11/30/2017 1150   LABSPEC 1.009 11/30/2017 1150   PHURINE 8.0 11/30/2017 1150   GLUCOSEU 50 (A) 11/30/2017 1150   HGBUR NEGATIVE 11/30/2017 1150   BILIRUBINUR NEGATIVE 11/30/2017 1150   KETONESUR NEGATIVE 11/30/2017 1150   PROTEINUR NEGATIVE 11/30/2017 1150   NITRITE NEGATIVE 11/30/2017 1150   LEUKOCYTESUR NEGATIVE 11/30/2017 1150    Sepsis Labs: Lactic Acid, Venous    Component Value Date/Time   LATICACIDVEN 2.03 (HH) 06/16/2017 1924    MICROBIOLOGY: Recent Results (from the past 240 hour(s))  Culture, Urine     Status: None   Collection Time: 11/30/17 11:50 AM  Result Value Ref Range Status   Specimen Description URINE, CLEAN CATCH  Final   Special Requests NONE  Final   Culture   Final    NO GROWTH Performed at [facility], [address], [place], [place] [address]    Report Status 12/02/2017 FINAL  Final  CSF culture     Status: None (Preliminary result)   Collection Time: 12/04/17 10:28 AM  Result Value Ref Range Status   Specimen Description CSF  Final   Special Requests NONE  Final   Gram Stain   Final    WBC PRESENT, PREDOMINANTLY MONONUCLEAR YEAST  PRESENT CYTOSPIN SMEAR CRITICAL RESULT CALLED TO, READ BACK BY AND VERIFIED WITH: POWERS,A @ 1209 ON [id number] BY POTEAT,S    Culture   Final    FEW YEAST IDENTIFICATION TO FOLLOW Performed at [facility], [address], [place], [place] [address]    Report Status PENDING  Incomplete  Fungus Culture With Stain     Status: None (Preliminary result)   Collection Time: 12/04/17 10:28 AM  Result Value Ref Range Status   Fungus Stain Final report  Final    Comment: (NOTE) Performed At: [facility] [address] [place], [place] [id number] Jolene Schimke MD WU:[id number]    Fungus (Mycology) Culture PENDING  Incomplete   Fungal Source PENDING  Incomplete  Fungus Culture Result     Status: None   Collection Time: 12/04/17 10:28 AM  Result Value Ref Range Status   Result 1 Comment  Final    Comment: (NOTE) Fungal elements, such as arthroconidia, hyphal fragments, chlamydoconidia, observed. POSITIVE SMEAR REPORTED TO NICHOLE M. AT 1024 ON 12/08/17 BY AM. Performed At: [facility] [address] [place], [place] [id number] Jolene Schimke MD VH:[id number]     RADIOLOGY STUDIES/RESULTS: Dg Chest 2 View  Result Date: 11/26/2017 CLINICAL DATA:  Weakness EXAM: CHEST  2 VIEW COMPARISON:  07/11/2017 chest radiograph. FINDINGS: Stable cardiomediastinal silhouette with normal heart size. No pneumothorax. No pleural effusion. Lungs appear clear, with no acute consolidative airspace disease and no pulmonary edema. IMPRESSION: No active cardiopulmonary disease. Electronically Signed   By: Delbert Phenix M.D.   On: 11/26/2017 17:27   Ct Head Wo Contrast  Result Date: 11/26/2017 CLINICAL DATA:  Syncopal episodes, generalized weakness, focal neural deficit of greater than 6 hours, history of HIV, cryptococcal meningitis, hypertension EXAM: CT HEAD WITHOUT CONTRAST TECHNIQUE: Contiguous axial images were obtained from the base of the skull through the vertex without intravenous  contrast. Sagittal and coronal MPR images reconstructed from axial data set. COMPARISON:  06/24/2017 FINDINGS: Brain: Exam degraded by patient motion. Generalized atrophy. Normal ventricular morphology. No midline shift or mass effect. Scattered areas of white matter hypoattenuation particularly frontal regions. No intracranial hemorrhage, mass lesion, or evidence acute infarction. No extra-axial fluid collections. Vascular: Unremarkable Skull: Unremarkable Sinuses/Orbits: Clear Other: N/A IMPRESSION: White matter hypoattenuation in the frontal regions greater on RIGHT similar prior exam. No new intracranial abnormalities. Electronically Signed   By: Ulyses Southward M.D.   On: 11/26/2017 17:05   Dg Fluoro Guide Lumbar Puncture  Result Date: 12/04/2017 CLINICAL DATA:  AIDS.  Cryptococcal meningitis. EXAM: DIAGNOSTIC LUMBAR PUNCTURE UNDER FLUOROSCOPIC GUIDANCE FLUOROSCOPY TIME:  Fluoroscopy Time: 36 sec of low-dose pulsed fluoro Radiation Exposure Index (if provided by the fluoroscopic device): 1.85 mGy Number of Acquired Spot Images: 0 PROCEDURE: Informed witnessed consent was obtained from the patient's sister, Sharin Mons, by telephone prior to the procedure, including potential complications of headache, allergy, and pain. With the patient prone, the lower back was prepped with Betadine. 1% Lidocaine was used for local anesthesia. Lumbar puncture was performed at the L3-4 level on the right using a 20 gauge needle with return of slightly amber tinged CSF with an opening pressure of 12 cm water. 6 ml of CSF were obtained for laboratory studies. The patient tolerated the procedure well and there were no apparent complications. IMPRESSION: Diagnostic lumbar puncture performed without immediate complications. Electronically Signed   By: Carey Bullocks M.D.   On: 12/04/2017 11:15   Dg Fluoro Guide Lumbar Puncture  Result Date: 11/27/2017 CLINICAL DATA:  AIDS.  Evaluate for cryptococcal meningitis. EXAM:  DIAGNOSTIC LUMBAR PUNCTURE UNDER FLUOROSCOPIC GUIDANCE FLUOROSCOPY TIME:  Fluoroscopy Time:  1 minute 14 seconds Radiation Exposure Index (if provided by the fluoroscopic device): 10 mGy Number of Acquired Spot Images: 1 PROCEDURE: Informed consent was obtained from the patient prior to the procedure, including  potential complications of headache, allergy, and pain. With the patient prone, the lower back was prepped with Betadine. 1% Lidocaine was used for local anesthesia. Lumbar puncture was performed at the L2-3 level using a 20 gauge needle with return of clear CSF with an opening pressure of 36 cm water. 9.5 ml of CSF were obtained for laboratory studies. The patient tolerated the procedure well and there were no apparent complications. IMPRESSION: 1. Successful lumbar puncture at L2-3. 2. Opening pressure of 36 cm water.  9.5 cc of CSF collected. Electronically Signed   By: Signa Kellaylor  Stroud M.D.   On: 11/27/2017 15:43     LOS: 11 days   Signature  Susa RaringPrashant Lilliahna Schubring M.D on 12/08/2017 at 11:42 AM  Between 7am to 7pm - Pager - [phone] ( page via [email or web address], text pages only, please mention full 10 digit call back number).  After 7pm go to [email or web address] - password [email or web address]

## 2017-12-08 NOTE — Progress Notes (Signed)
PHARMACY BRIEF NOTE:   ELECTROLYTE REPLACEMENT PER LIPSOMAL AMPHOTERICIN B PROTOCOL  Ambisome 150 mg (dose reduced to 3 mg/kg per ID d/t renal insuff.) IV q24h in combination with flucytosine 1250 mg (25 mg/kg) PO q6h  for cryptococcal meningitis.   ID service was consulted and is actively following.  Recent Labs    12/07/17 0458 12/08/17 0443  NA 129* 133*  K 4.1 3.9  CL 103 107  CO2 22 23  GLUCOSE 91 91  BUN 24* 15  CREATININE 1.12 0.92  CALCIUM 8.0* 8.0*  MG 1.8 1.6*  ALBUMIN 2.6*  --   ALKPHOS 104  --   AST 57*  --   ALT 102*  --   BILITOT 0.4  --     Assessment: - K 3.9 - Mag down 1.6 (s/p 1gm mag sulfate on 1/21) - SCr trending down 0.92, UOP 2.2 ml/kg/hr   Plan, per Amphotericin B Protocol: - Mag Sulfate 2 grams IV x 1 this AM (ordered by MD) - Continue NS 1000 mL (increased from 500 mg) bolus IV before and after each infusion of Ambisome  - Continue daily BMet, Mg - Monitor SCr, UOP  Dorna LeitzAnh Langley Flatley, PharmD, BCPS 12/08/2017 7:55 AM

## 2017-12-08 NOTE — Progress Notes (Signed)
[facility]    Date of Admission:  11/26/2017   Total days of antibiotics 12        Day 12 liposomal ampho/flucytosine        Day 10 bactrim proph           ID: Kenneth Rivas is a 57 y.o. male with  Advanced poorly controlled hiv disease, CD 4 count <10/VL 56,200 with CSF Cr Ag1:>2560 Principal Problem:   Cryptococcal meningoencephalitis (HCC) Active Problems:   HTN (hypertension)   Cachexia associated with AIDS (HCC)   Hypokalemia   Hyponatremia   Protein-calorie malnutrition, severe   AIDS (acquired immune deficiency syndrome) (HCC)   Prolonged QT interval   Near syncope   Cryptococcal meningitis (HCC)   Anemia   Abnormal echocardiogram   Cardiomyopathy [facility]): Per 2 d echo 11/28/2017    Subjective: Appears sleepy today. Denies headache.  Medications:  . carvedilol  3.125 mg Oral BID WC  . dextrose  10 mL Intravenous Q24H  . dextrose  10 mL Intravenous Q24H  . docusate  25 mg Both EARS BID  . enoxaparin (LOVENOX) injection  40 mg Subcutaneous Q24H  . feeding supplement (ENSURE ENLIVE)  237 mL Oral TID BM  . feeding supplement (PRO-STAT SUGAR FREE 64)  30 mL Oral TID WC  . flucytosine  25 mg/kg Oral Q6H  . megestrol  40 mg Oral Daily  . multivitamin with minerals  1 tablet Oral Daily  . sodium chloride  1,000 mL Intravenous Q24H  . sodium chloride  1,000 mL Intravenous Q24H  . sodium chloride flush  3 mL Intravenous Q12H  . [START ON 12/10/2017] sulfamethoxazole-trimethoprim  1 tablet Oral Daily    Objective: Vital signs in last 24 hours: Temp:  [97.8 F (36.6 C)] 97.8 F (36.6 C) (01/22 1340) Pulse Rate:  [61-83] 83 (01/22 1340) Resp:  [18-20] 18 (01/22 1340) BP: (104-154)/(86-99) 129/86 (01/22 1340) SpO2:  [96 %-100 %] 100 % (01/22 1340) Weight:  [108 lb 11 oz (49.3 kg)] 108 lb 11 oz (49.3 kg) (01/22 0428)  Physical Exam  Constitutional: He is oriented to person, place. He appears chronically ill, disheveled. No distress.    HENT: disconjugate gaze-left eye lateral gaze Mouth/Throat: Oropharynx is clear and moist. No oropharyngeal exudate.  Cardiovascular: Normal rate, regular rhythm and normal heart sounds. Exam reveals no gallop and no friction rub.  No murmur heard.  Pulmonary/Chest: Effort normal and breath sounds normal. No respiratory distress. He has no wheezes.  Abdominal: Soft. Bowel sounds are normal. He exhibits no distension. There is no tenderness.  Skin: Skin is warm and dry. No rash noted. No erythema.     Lab Results Recent Labs    12/06/17 0813 12/07/17 0458 12/08/17 0443  WBC 2.8*  --   --   HGB 8.4*  --   --   HCT 24.8*  --   --   NA 127* 129* 133*  K 4.7 4.1 3.9  CL 100* 103 107  CO2 21* 22 23  BUN 30* 24* 15  CREATININE 1.45* 1.12 0.92   Liver Panel Recent Labs    12/07/17 0458  PROT 6.6  ALBUMIN 2.6*  AST 57*  ALT 102*  ALKPHOS 104  BILITOT 0.4    Microbiology: 1/18 CSF cx + smear but cx still pending. 1/11 CSF cx + growth of cryptococcal neoformans on 1/14 Studies/Results: No results found.   Assessment/Plan: reinduction for cryptococcal meningitis = continue on liposomal ampho  dosed at 3mg /kg with IL NS pre and post infusion. Continues to tolerate flucytosine at current dose. aki improved today. He will need  Longer than 14 days since we want to make sure that his repeat CSF cultures are sterile before converting to oral fluconazole. Recommend to keep his current regimen through the end of the week. Get repeat LP on 1/25 or 1/28.   aki = improving, getting closer to baseline. It was elevated over the weekend likely due to ampho nephrotoxicity plus/minus other renal drugs like lasix, acyclovir. He is improved today  Recommend to restart bactrim ss daily  hiv disease = post poning hiv treatment due til 5 wks or more of CM treatment to minimize IRIS risk  Severe malnutrition = continue with protein supplementation.  Adherence = he was found having numerous  pills in his pillow over the weekend. Continue with watching him take meds with ice cream  oi proph = recommend giving him 1200mg  IV azithromycin every Tuesday, continue with bactrim ss daily  [facility] CenterCynthia Han [facility] Cell: [phone] Pager: [phone]  12/08/2017, 5:04 PM

## 2017-12-09 LAB — BASIC METABOLIC PANEL
Anion gap: 4 — ABNORMAL LOW (ref 5–15)
BUN: 26 mg/dL — AB (ref 6–20)
CALCIUM: 8.3 mg/dL — AB (ref 8.9–10.3)
CO2: 23 mmol/L (ref 22–32)
Chloride: 105 mmol/L (ref 101–111)
Creatinine, Ser: 0.92 mg/dL (ref 0.61–1.24)
GFR calc Af Amer: >60 mL/min (ref >60)
GLUCOSE: 97 mg/dL (ref 65–99)
Potassium: 4 mmol/L (ref 3.5–5.1)
Sodium: 132 mmol/L — ABNORMAL LOW (ref 135–145)

## 2017-12-09 LAB — CBC WITH DIFFERENTIAL/PLATELET
BASOS ABS: 0 10*3/uL (ref 0.0–0.1)
BASOS PCT: 0 %
Eosinophils Absolute: 0.2 10*3/uL (ref 0.0–0.7)
Eosinophils Relative: 5 %
HCT: 24.2 % — ABNORMAL LOW (ref 39.0–52.0)
Hemoglobin: 8.3 g/dL — ABNORMAL LOW (ref 13.0–17.0)
LYMPHS PCT: 16 %
Lymphs Abs: 0.5 10*3/uL — ABNORMAL LOW (ref 0.7–4.0)
MCH: 27.8 pg (ref 26.0–34.0)
MCHC: 34.3 g/dL (ref 30.0–36.0)
MCV: 80.9 fL (ref 78.0–100.0)
MONO ABS: 0.4 10*3/uL (ref 0.1–1.0)
MONOS PCT: 12 %
Neutro Abs: 2.2 10*3/uL (ref 1.7–7.7)
Neutrophils Relative %: 67 %
PLATELETS: 192 10*3/uL (ref 150–400)
RBC: 2.99 MIL/uL — ABNORMAL LOW (ref 4.22–5.81)
RDW: 13.9 % (ref 11.5–15.5)
WBC: 3.2 10*3/uL — ABNORMAL LOW (ref 4.0–10.5)

## 2017-12-09 LAB — MAGNESIUM: MAGNESIUM: 1.5 mg/dL — AB (ref 1.7–2.4)

## 2017-12-09 MED ORDER — MAGNESIUM SULFATE 4 GM/100ML IV SOLN
4.0000 g | Freq: Once | INTRAVENOUS | Status: AC
  Administered 2017-12-09: 4 g via INTRAVENOUS
  Filled 2017-12-09: qty 100

## 2017-12-09 NOTE — Progress Notes (Signed)
PHARMACY BRIEF NOTE:   ELECTROLYTE REPLACEMENT PER LIPSOMAL AMPHOTERICIN B PROTOCOL  Ambisome 150 mg (dose reduced to 3 mg/kg per ID d/t renal insufficency) IV q24h in combination with flucytosine 1250 mg (25 mg/kg/dose) PO q6h  for cryptococcal meningitis.   ID service was consulted and is actively following.  Recent Labs    12/07/17 0458 12/08/17 0443 12/09/17 0423  NA 129* 133* 132*  K 4.1 3.9 4.0  CL 103 107 105  CO2 22 23 23   GLUCOSE 91 91 97  BUN 24* 15 26*  CREATININE 1.12 0.92 0.92  CALCIUM 8.0* 8.0* 8.3*  MG 1.8 1.6* 1.5*  ALBUMIN 2.6*  --   --   ALKPHOS 104  --   --   AST 57*  --   --   ALT 102*  --   --   BILITOT 0.4  --   --     Assessment: - Potassium = 4 - Magnesium down to 1.5 (s/p 2g Magnesium Sulfate IV on 1/22) - SCr 0.92, now WNL/stable. UOP 2.6 ml/kg/hr   Plan, per Amphotericin B Protocol: - Magnesium Sulfate 4 grams IV x 1 this AM  - Continue NS 1000 mL (increased from 500 mL per ID) bolus IV before and after each infusion of Ambisome  - Continue daily BMET, Mag - Monitor SCr, UOP   Greer PickerelJigna Gabryela Kimbrell, PharmD, BCPS Pager: [phone] 12/09/2017 8:38 AM

## 2017-12-09 NOTE — Progress Notes (Signed)
Physical Therapy Treatment Patient Details Name: Kenneth Rivas MRN: [id number] DOB: 05-Aug-1961 Today's Date: 12/09/2017    History of Present Illness 57 yo male admitted with weakness, near syncopal episode.  Dx:  cryptococcal meningoencephalitis.  Hx of HIV/AIDS, Hep B, drug abuse, noncompliance, cachexia, anemia.     PT Comments    Pt tolerated increased ambulation distance of 45' today with min assist for balance. He remains a high fall risk and requires hands on assist for mobility for safety.   Follow Up Recommendations  SNF     Equipment Recommendations  None recommended by PT    Recommendations for Other Services       Precautions / Restrictions Precautions Precautions: Fall Precaution Comments: unsteady gait Restrictions Weight Bearing Restrictions: No    Mobility  Bed Mobility   Bed Mobility: Supine to Sit     Supine to sit: Supervision;HOB elevated     General bed mobility comments: HOB up, used rail  Transfers Overall transfer level: Needs assistance Equipment used: Rolling walker (2 wheeled) Transfers: Sit to/from Stand Sit to Stand: Min assist         General transfer comment: Min A to rise and steady, VCs hand placement  Ambulation/Gait Ambulation/Gait assistance: Min assist Ambulation Distance (Feet): 45 Feet Assistive device: Rolling walker (2 wheeled) Gait Pattern/deviations: Step-to pattern;Trunk flexed;Staggering left;Staggering right;Decreased stride length;Wide base of support   Gait velocity interpretation: Below normal speed for age/gender General Gait Details: min A for balance and to maneuver RW around obstacles, VCs for posture and positioning in RW, HR 124 with walking, no dyspnea, distance limited by fatigue   Stairs            Wheelchair Mobility    Modified Rankin (Stroke Patients Only)       Balance   Sitting-balance support: Bilateral upper extremity supported;Feet supported Sitting balance-Leahy Scale:  Fair     Standing balance support: Bilateral upper extremity supported Standing balance-Leahy Scale: Poor Standing balance comment: reliant upon BUE support                            Cognition Arousal/Alertness: Awake/alert Behavior During Therapy: WFL for tasks assessed/performed Overall Cognitive Status: Within Functional Limits for tasks assessed                                 General Comments: very HOH      Exercises      General Comments        Pertinent Vitals/Pain Pain Assessment: No/denies pain    Home Living                      Prior Function            PT Goals (current goals can now be found in the care plan section) Acute Rehab PT Goals Patient Stated Goal: none stated PT Goal Formulation: With patient Time For Goal Achievement: 12/11/17 Potential to Achieve Goals: Fair Progress towards PT goals: Progressing toward goals    Frequency    Min 3X/week      PT Plan Current plan remains appropriate    Co-evaluation              AM-PAC PT "6 Clicks" Daily Activity  Outcome Measure  Difficulty turning over in bed (including adjusting bedclothes, sheets and blankets)?: A Little Difficulty moving from lying on back  to sitting on the side of the bed? : A Little Difficulty sitting down on and standing up from a chair with arms (e.g., wheelchair, bedside commode, etc,.)?: A Little Help needed moving to and from a bed to chair (including a wheelchair)?: A Little Help needed walking in hospital room?: A Little Help needed climbing 3-5 steps with a railing? : Total 6 Click Score: 16    End of Session Equipment Utilized During Treatment: Gait belt Activity Tolerance: Patient tolerated treatment well Patient left: with call bell/phone within reach;with bed alarm set;in chair;with chair alarm set Nurse Communication: Mobility status PT Visit Diagnosis: Muscle weakness (generalized) (M62.81);Difficulty in  walking, not elsewhere classified (R26.2)     Time: 0981-1914 PT Time Calculation (min) (ACUTE ONLY): 32 min  Charges:  $Gait Training: 8-22 mins $Therapeutic Activity: 8-22 mins                    G Codes:          Tamala Ser 12/09/2017, 11:14 AM [phone]

## 2017-12-09 NOTE — Progress Notes (Signed)
[facility]  PROGRESS NOTE  Kenneth Rivas:[id number] DOB: 1961/10/22 DOA: 11/26/2017 PCP: Randall Hiss, MD   Brief HPI:   57 y.o. male with prior history of HIV/AIDS-last CD4 count less than 10, history of cryptococcal meningitis in 2018 (not very compliant with antifungals) admitted with generalized weakness, near syncopal event. Patient was noted to have a prolonged QTC, and was also found to have recurrent/persistent cryptococcal meningitis and admitted to the hospitalist service. See below for further details    Subjective   Patient seen and examined, denies headache. No fever, no chest pain.   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 Final fungal culture is pending. Infectious disease following. Plan to continue current treatment with repeat lumbar puncture on 12/11/2017, will follow ID recommendations. 2. Hyponatremia- improved, today sodium is 132. Follow BMP in am. 3. Acute kidney injury-resolved likely from acyclovir, improved IV fluids. ACE inhibitor and Lasix are currently hold. Bactrim was held for 2 days. 4. HIV/AIDS-ID following. A arteries have been deferred in the setting of redo induction of cryptococcal meningitis. Continue Bactrim for PCP prophylaxis, he is off Zithromax for MAI prophylaxis due to prolonged QTC.He remains quite noncompliant with his medications, per nursing staff is throwing his pills under the bed on a consistent basis. 5. Near syncope- secondary to dehydration/orthostatic hypotension. QTC has normalized. 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 6. Nonischemic cardiomyopathy- He remains compensated-continue with Coreg and ACE inhibitor-appreciate cardiology input.  Will need outpatient follow-up with cardiology. 7. QT prolongation- resolved 8. Severe  protein calorie malnutrition- continue supplements 9. Hypertension- continue coreg, Lisinopril, Lasix. 10. Anemia/leukopenia- stable, likely secondary to HIV.      DVT prophylaxis: Lovenox  Code Status: Full code  Family Communication: No family at bedside   Disposition Plan: Discharge after next LP on 12/11/2017, case management and social worker both requested to arrange for safe disposition.       Consultants:  Infectious disease  Procedures:  Lumbar puncture  Continuous infusions . amphotericin  B  Liposome (AMBISOME) ADULT IV Stopped (12/08/17 2020)      Antibiotics:   Anti-infectives (From admission, onward)   Start     Dose/Rate Route Frequency Ordered Stop   12/10/17 1000  sulfamethoxazole-trimethoprim (BACTRIM DS,SEPTRA DS) 800-160 MG per tablet 1 tablet     1 tablet Oral Daily 12/06/17 1039     12/08/17 1800  azithromycin (ZITHROMAX) tablet 1,200 mg     1,200 mg Oral  Once 12/08/17 1707 12/08/17 1844   12/06/17 1800  amphotericin B liposome (AMBISOME) 150 mg in dextrose 5 % 500 mL IVPB     3 mg/kg  49.6 kg 250 mL/hr over 120 Minutes Intravenous Every 24 hours 12/06/17 1147     12/04/17 1730  amphotericin B liposome (AMBISOME) 200 mg in dextrose 5 % 500 mL IVPB  Status:  Discontinued     200 mg 250 mL/hr over 120 Minutes Intravenous Every 24 hours 12/04/17 1350 12/06/17 1147   11/27/17 1800  flucytosine (ANCOBON) capsule 1,250 mg     25 mg/kg  47 kg Oral Every 6 hours 11/27/17 1615     11/27/17 1730  amphotericin B liposome (AMBISOME) 240 mg in dextrose 5 % 500 mL IVPB  Status:  Discontinued     5 mg/kg  47 kg 250 mL/hr over 120 Minutes Intravenous Every 24 hours 11/27/17 1615 12/04/17 1350  11/27/17 1000  sulfamethoxazole-trimethoprim (BACTRIM DS,SEPTRA DS) 800-160 MG per tablet 1 tablet  Status:  Discontinued     1 tablet Oral Daily 11/27/17 0839 12/06/17 1039       Objective   Vitals:   12/08/17 0855 12/08/17 1340 12/08/17 1700 12/08/17  2002  BP: (!) 154/99 129/86 138/88 (!) 153/93  Pulse: 72 83 80 88  Resp:  18 16 18   Temp:  97.8 F (36.6 C)  98.8 F (37.1 C)  TempSrc:  Oral  Oral  SpO2:  100%  100%  Weight:      Height:        Intake/Output Summary (Last 24 hours) at 12/09/2017 1601 Last data filed at 12/09/2017 1500 Gross per 24 hour  Intake 1800 ml  Output 900 ml  Net 900 ml   Filed Weights   12/05/17 0500 12/06/17 0500 12/08/17 0428  Weight: 47.7 kg (105 lb 2.6 oz) 49.6 kg (109 lb 5.6 oz) 49.3 kg (108 lb 11 oz)     Physical Examination:   Physical Exam: Eyes: No icterus, extraocular muscles intact  Mouth: Oral mucosa is moist, no lesions on palate,  Neck: Supple, no deformities, masses, or tenderness Lungs: Normal respiratory effort, bilateral clear to auscultation, no crackles or wheezes.  Heart: Regular rate and rhythm, S1 and S2 normal, no murmurs, rubs auscultated Abdomen: BS normoactive,soft,nondistended,non-tender to palpation,no organomegaly Extremities: No pretibial edema, no erythema, no cyanosis, no clubbing Neuro : Alert and oriented to time, place and person, No focal deficits      Data Reviewed: I have personally reviewed following labs and imaging studies  CBG: No results for input(s): GLUCAP in the last 168 hours.  CBC: Recent Labs  Lab 12/04/17 0423 12/06/17 0813 12/09/17 0423  WBC 3.5* 2.8* 3.2*  NEUTROABS  --   --  2.2  HGB 9.1* 8.4* 8.3*  HCT 27.3* 24.8* 24.2*  MCV 81.3 80.0 80.9  PLT 159 182 192    Basic Metabolic Panel: Recent Labs  Lab 12/05/17 0434 12/06/17 0813 12/07/17 0458 12/08/17 0443 12/09/17 0423  NA 127* 127* 129* 133* 132*  K 4.4 4.7 4.1 3.9 4.0  CL 98* 100* 103 107 105  CO2 24 21* 22 23 23   GLUCOSE 90 89 91 91 97  BUN 27* 30* 24* 15 26*  CREATININE 1.23 1.45* 1.12 0.92 0.92  CALCIUM 8.3* 8.3* 8.0* 8.0* 8.3*  MG 2.0 2.3 1.8 1.6* 1.5*    Recent Results (from the past 240 hour(s))  Culture, Urine     Status: None   Collection Time:  11/30/17 11:50 AM  Result Value Ref Range Status   Specimen Description URINE, CLEAN CATCH  Final   Special Requests NONE  Final   Culture   Final    NO GROWTH Performed at [facility], [address] [place], [place] [address]    Report Status 12/02/2017 FINAL  Final  CSF culture     Status: None   Collection Time: 12/04/17 10:28 AM  Result Value Ref Range Status   Specimen Description CSF  Final   Special Requests NONE  Final   Gram Stain   Final    WBC PRESENT, PREDOMINANTLY MONONUCLEAR YEAST PRESENT CYTOSPIN SMEAR CRITICAL RESULT CALLED TO, READ BACK BY AND VERIFIED WITH: POWERS,A @ 1209 ON [id number] BY POTEAT,S    Culture FEW CRYPTOCOCCUS NEOFORMANS  Final   Report Status 12/08/2017 FINAL  Final  Fungus Culture With Stain     Status: None (Preliminary result)  Collection Time: 12/04/17 10:28 AM  Result Value Ref Range Status   Fungus Stain Final report  Final    Comment: (NOTE) Performed At: [facility] [address] [place], [place] [id number] Jolene Schimke MD WU:[id number]    Fungus (Mycology) Culture PENDING  Incomplete   Fungal Source PENDING  Incomplete  Fungus Culture Result     Status: None   Collection Time: 12/04/17 10:28 AM  Result Value Ref Range Status   Result 1 Comment  Final    Comment: (NOTE) Fungal elements, such as arthroconidia, hyphal fragments, chlamydoconidia, observed. POSITIVE SMEAR REPORTED TO NICHOLE M. AT 1024 ON 12/08/17 BY AM. Performed At: [facility] [address] [place], [place] [id number] Jolene Schimke MD VH:[id number]      Liver Function Tests: Recent Labs  Lab 12/07/17 0458  AST 57*  ALT 102*  ALKPHOS 104  BILITOT 0.4  PROT 6.6  ALBUMIN 2.6*   No results for input(s): LIPASE, AMYLASE in the last 168 hours. No results for input(s): AMMONIA in the last 168 hours.  Cardiac Enzymes: No results for input(s): CKTOTAL, CKMB, CKMBINDEX, TROPONINI in the last 168 hours. BNP (last 3 results) No  results for input(s): BNP in the last 8760 hours.  ProBNP (last 3 results) No results for input(s): PROBNP in the last 8760 hours.    Studies: No results found.  Scheduled Meds: . carvedilol  3.125 mg Oral BID WC  . dextrose  10 mL Intravenous Q24H  . dextrose  10 mL Intravenous Q24H  . docusate  25 mg Both EARS BID  . enoxaparin (LOVENOX) injection  40 mg Subcutaneous Q24H  . feeding supplement (ENSURE ENLIVE)  237 mL Oral TID BM  . feeding supplement (PRO-STAT SUGAR FREE 64)  30 mL Oral TID WC  . flucytosine  25 mg/kg Oral Q6H  . megestrol  40 mg Oral Daily  . multivitamin with minerals  1 tablet Oral Daily  . sodium chloride  1,000 mL Intravenous Q24H  . sodium chloride  1,000 mL Intravenous Q24H  . sodium chloride flush  3 mL Intravenous Q12H  . [START ON 12/10/2017] sulfamethoxazole-trimethoprim  1 tablet Oral Daily      Time spent: 25 min  Meredeth Ide   [facility] Pager [phone]. If 7PM-7AM, please contact night-coverage at [email or web address], Office  [phone]  password [email or web address]  12/09/2017, 4:01 PM  LOS: 12 days

## 2017-12-09 NOTE — Progress Notes (Signed)
Nutrition Follow-up  DOCUMENTATION CODES:   Underweight, Severe malnutrition in context of chronic illness  INTERVENTION:    Ensure Enlive po TID, each supplement provides 350 kcal and 20 grams of protein  30 ml Prostat BID, each supplement provides 100 kcals and 15 grams protein.   Provide MVI daily  NUTRITION DIAGNOSIS:   Severe Malnutrition related to chronic illness(HIV with non compliance) as evidenced by percent weight loss, severe fat depletion, moderate fat depletion, severe muscle depletion.  Ongoing  GOAL:   Patient will meet greater than or equal to 90% of their needs  Progressing  MONITOR:   PO intake, Supplement acceptance, Weight trends, Labs  REASON FOR ASSESSMENT:   Consult Assessment of nutrition requirement/status  ASSESSMENT:   Pt with PMH significant for HIV/AIDS with poor compliance, drug abuse, HTN, and anemia. Presents this admission with near syncopal event. Admitted for prolonged QT interval and dehydration.    Pt reports he is eating well and requests to have tomato with chicken noodle soup. RN at bedside states he is eating mostly soup but family brings in food from home. Yesterday his friend brought him three chicken leg which he ate. Meal completions are off on possibly related to outside food being given. Pt drinks Ensure three times per day and finishes 3/4 of Prostat each time. Weight noted to trend up from 107 lb 1/17 to 108 lb today. Will continue to monitor.   Medications reviewed and include: colace, megace, MVI with minerals, abx, Mg sulfate Labs reviewed: Na 132 (L) Mg 1.5 (L) AST 57 (H) ALT 102 (H)  Diet Order:  Diet 2 gram sodium Room service appropriate? Yes; Fluid consistency: Thin; Fluid restriction: 1200 mL Fluid  EDUCATION NEEDS:   Not appropriate for education at this time  Skin:  Skin Assessment: Reviewed RN Assessment  Last BM:  1/14  Height:   Ht Readings from Last 1 Encounters:  11/27/17 5\' 6"  (1.676 m)     Weight:   Wt Readings from Last 1 Encounters:  12/08/17 108 lb 11 oz (49.3 kg)    Ideal Body Weight:  70 kg  BMI:  Body mass index is 17.54 kg/m.  Estimated Nutritional Needs:   Kcal:  2100-2300 kcal/day  Protein:  100-110 g/day  Fluid:  >2.1 L/day    Vanessa Kickarly Tywan Siever RD, LDN Clinical Nutrition Pager # [phone]

## 2017-12-09 NOTE — Progress Notes (Signed)
IV malfunction--IVT notified for restart. SRP, RN

## 2017-12-09 NOTE — Progress Notes (Signed)
Occupational Therapy Treatment Patient Details Name: Kenneth Rivas MRN: [id number] DOB: 10-Oct-1961 Today's Date: 12/09/2017    History of present illness 57 yo male admitted with weakness, near syncopal episode.  Dx:  cryptococcal meningoencephalitis.  Hx of HIV/AIDS, Hep B, drug abuse, noncompliance, cachexia, anemia.    OT comments  Pt sat up in chair for almost 2 hours today.  Assisted back to bed with mod +1 assistance. Pt performed grooming activities at EOB prior to lying down. Goals revised today  Follow Up Recommendations  SNF    Equipment Recommendations  3 in 1 bedside commode    Recommendations for Other Services      Precautions / Restrictions Precautions Precautions: Fall Precaution Comments: unsteady gait Restrictions Weight Bearing Restrictions: No       Mobility Bed Mobility           Sit to supine: Supervision;HOB elevated   General bed mobility comments: assist for lines  Transfers   Equipment used: 1 person hand held assist   Sit to Stand: Mod assist(from recliner) Stand pivot transfers: Mod assist       General transfer comment: assist to rise and stabilize    Balance                                           ADL either performed or assessed with clinical judgement   ADL       Grooming: Set up;Sitting;Wash/dry hands;Wash/dry Scientist, research (physical sciences)face                   Toilet Transfer: Moderate assistance;Stand-pivot(chair to bed)             General ADL Comments: assisted back to bed from recliner with mod +1 assistance.  Performed grooming at EOB.     Vision       Perception     Praxis      Cognition Arousal/Alertness: Awake/alert Behavior During Therapy: WFL for tasks assessed/performed Overall Cognitive Status: Within Functional Limits for tasks assessed                                 General Comments: very [facility]        Exercises     Shoulder Instructions       General Comments       Pertinent Vitals/ Pain       Pain Assessment: No/denies pain  Home Living                                          Prior Functioning/Environment              Frequency           Progress Toward Goals  OT Goals(current goals can now be found in the care plan section)  Progress towards OT goals: Progressing toward goals(goals updated)  Acute Rehab OT Goals Time For Goal Achievement: 12/23/17 Potential to Achieve Goals: Fair ADL Goals Pt Will Transfer to Toilet: with min assist;stand pivot transfer;bedside commode Additional ADL Goal #1: pt will go from sit to stand with min A and maintain for 2 minutes at this level for adls Additional ADL Goal #2: pt will perform 20 minutes of adl or strengthening  activity with 3 rest breaks to increase strength and endurance for adls  Plan      Co-evaluation                 AM-PAC PT "6 Clicks" Daily Activity     Outcome Measure   Help from another person eating meals?: A Little Help from another person taking care of personal grooming?: A Little Help from another person toileting, which includes using toliet, bedpan, or urinal?: A Lot Help from another person bathing (including washing, rinsing, drying)?: A Lot Help from another person to put on and taking off regular upper body clothing?: A Lot Help from another person to put on and taking off regular lower body clothing?: Total 6 Click Score: 13    End of Session        Activity Tolerance Patient limited by fatigue   Patient Left in bed;with call bell/phone within reach;with bed alarm set   Nurse Communication          Time: 1610-9604 OT Time Calculation (min): 15 min  Charges: OT General Charges $OT Visit: 1 Visit OT Treatments $Therapeutic Activity: 8-22 mins  Marica Otter, OTR/L [phone] 12/09/2017   Kenneth Rivas 12/09/2017, 3:22 PM

## 2017-12-10 LAB — MAGNESIUM: Magnesium: 1.8 mg/dL (ref 1.7–2.4)

## 2017-12-10 LAB — BASIC METABOLIC PANEL
ANION GAP: 5 (ref 5–15)
BUN: 17 mg/dL (ref 6–20)
CALCIUM: 8.3 mg/dL — AB (ref 8.9–10.3)
CO2: 21 mmol/L — AB (ref 22–32)
Chloride: 105 mmol/L (ref 101–111)
Creatinine, Ser: 0.69 mg/dL (ref 0.61–1.24)
GLUCOSE: 94 mg/dL (ref 65–99)
POTASSIUM: 4 mmol/L (ref 3.5–5.1)
Sodium: 131 mmol/L — ABNORMAL LOW (ref 135–145)

## 2017-12-10 MED ORDER — AZITHROMYCIN 600 MG PO TABS
1200.0000 mg | ORAL_TABLET | ORAL | Status: DC
  Administered 2017-12-15 – 2017-12-22 (×2): 1200 mg via ORAL
  Filled 2017-12-10 (×2): qty 2

## 2017-12-10 MED ORDER — MAGNESIUM SULFATE 2 GM/50ML IV SOLN
2.0000 g | Freq: Once | INTRAVENOUS | Status: AC
  Administered 2017-12-10: 2 g via INTRAVENOUS
  Filled 2017-12-10: qty 50

## 2017-12-10 NOTE — Progress Notes (Signed)
[facility]  PROGRESS NOTE  Kenneth Rivas ZOX:[id number] DOB: July 06, 1961 DOA: 11/26/2017 PCP: Randall Hiss, MD   Brief HPI:   57 y.o. male with prior history of HIV/AIDS-last CD4 count less than 10, history of cryptococcal meningitis in 2018 (not very compliant with antifungals) admitted with generalized weakness, near syncopal event. Patient was noted to have a prolonged QTC, and was also found to have recurrent/persistent cryptococcal meningitis and admitted to the hospitalist service. See below for further details    Subjective   Patient seen and examined, denies headache. No shortness of breath.   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 Final fungal culture is pending. Infectious disease following. Plan to continue current treatment with repeat lumbar puncture on 12/11/2017, will follow ID recommendations. 2. Hyponatremia- improved, today sodium is 132. Follow BMP in am. 3. Acute kidney injury-resolved likely from acyclovir, improved IV fluids. ACE inhibitor and Lasix are currently hold. Bactrim was held for 2 days. 4. HIV/AIDS-ID following. A arteries have been deferred in the setting of redo induction of cryptococcal meningitis. Continue Bactrim for PCP prophylaxis, he was off Zithromax for MAI prophylaxis due to prolonged QTC.He remains quite noncompliant with his medications, per nursing staff is throwing his pills under the bed on a consistent basis. Will restart Zithromax 5. Near syncope- secondary to dehydration/orthostatic hypotension. QTC has normalized. 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 6. Nonischemic cardiomyopathy- He remains compensated-continue with Coreg and ACE inhibitor-appreciate cardiology input.  Will need outpatient follow-up with cardiology. 7. QT prolongation-  resolved 8. Severe protein calorie malnutrition- continue supplements 9. Hypertension- continue coreg, Lisinopril, Lasix. 10. Anemia/leukopenia- stable, likely secondary to HIV.      DVT prophylaxis: Lovenox  Code Status: Full code  Family Communication: No family at bedside   Disposition Plan: Discharge after next LP on 12/11/2017, case management and social worker both requested to arrange for safe disposition.       Consultants:  Infectious disease  Procedures:  Lumbar puncture  Continuous infusions . amphotericin  B  Liposome (AMBISOME) ADULT IV Stopped (12/09/17 2238)      Antibiotics:   Anti-infectives (From admission, onward)   Start     Dose/Rate Route Frequency Ordered Stop   12/10/17 1000  sulfamethoxazole-trimethoprim (BACTRIM DS,SEPTRA DS) 800-160 MG per tablet 1 tablet     1 tablet Oral Daily 12/06/17 1039     12/08/17 1800  azithromycin (ZITHROMAX) tablet 1,200 mg     1,200 mg Oral  Once 12/08/17 1707 12/08/17 1844   12/06/17 1800  amphotericin B liposome (AMBISOME) 150 mg in dextrose 5 % 500 mL IVPB     3 mg/kg  49.6 kg 250 mL/hr over 120 Minutes Intravenous Every 24 hours 12/06/17 1147     12/04/17 1730  amphotericin B liposome (AMBISOME) 200 mg in dextrose 5 % 500 mL IVPB  Status:  Discontinued     200 mg 250 mL/hr over 120 Minutes Intravenous Every 24 hours 12/04/17 1350 12/06/17 1147   11/27/17 1800  flucytosine (ANCOBON) capsule 1,250 mg     25 mg/kg  47 kg Oral Every 6 hours 11/27/17 1615     11/27/17 1730  amphotericin B liposome (AMBISOME) 240 mg in dextrose 5 % 500 mL IVPB  Status:  Discontinued     5 mg/kg  47 kg 250 mL/hr over 120 Minutes Intravenous Every 24 hours 11/27/17 1615  12/04/17 1350   11/27/17 1000  sulfamethoxazole-trimethoprim (BACTRIM DS,SEPTRA DS) 800-160 MG per tablet 1 tablet  Status:  Discontinued     1 tablet Oral Daily 11/27/17 0839 12/06/17 1039       Objective   Vitals:   12/09/17 2137 12/10/17 0402  12/10/17 0419 12/10/17 0852  BP: (!) 153/86 (!) 156/90  (!) 144/82  Pulse: 98 (!) 103  98  Resp: 18 18  18   Temp: 98.9 F (37.2 C) 99.4 F (37.4 C)  98.8 F (37.1 C)  TempSrc: Oral Oral  Oral  SpO2: 98% 100%  100%  Weight:   47.8 kg (105 lb 6.1 oz)   Height:        Intake/Output Summary (Last 24 hours) at 12/10/2017 1321 Last data filed at 12/10/2017 0853 Gross per 24 hour  Intake 2720 ml  Output 4175 ml  Net -1455 ml   Filed Weights   12/06/17 0500 12/08/17 0428 12/10/17 0419  Weight: 49.6 kg (109 lb 5.6 oz) 49.3 kg (108 lb 11 oz) 47.8 kg (105 lb 6.1 oz)     Physical Examination:   Physical Exam: Eyes: No icterus, extraocular muscles intact  Mouth: Oral mucosa is moist, no lesions on palate,  Neck: Supple, no deformities, masses, or tenderness Lungs: Normal respiratory effort, bilateral clear to auscultation, no crackles or wheezes.  Heart: Regular rate and rhythm, S1 and S2 normal, no murmurs, rubs auscultated Abdomen: BS normoactive,soft,nondistended,non-tender to palpation,no organomegaly Extremities: No pretibial edema, no erythema, no cyanosis, no clubbing Neuro : Alert and oriented to time, place and person, No focal deficits Skin: No rashes seen on exam     Data Reviewed: I have personally reviewed following labs and imaging studies  CBG: No results for input(s): GLUCAP in the last 168 hours.  CBC: Recent Labs  Lab 12/04/17 0423 12/06/17 0813 12/09/17 0423  WBC 3.5* 2.8* 3.2*  NEUTROABS  --   --  2.2  HGB 9.1* 8.4* 8.3*  HCT 27.3* 24.8* 24.2*  MCV 81.3 80.0 80.9  PLT 159 182 192    Basic Metabolic Panel: Recent Labs  Lab 12/06/17 0813 12/07/17 0458 12/08/17 0443 12/09/17 0423 12/10/17 0413  NA 127* 129* 133* 132* 131*  K 4.7 4.1 3.9 4.0 4.0  CL 100* 103 107 105 105  CO2 21* 22 23 23  21*  GLUCOSE 89 91 91 97 94  BUN 30* 24* 15 26* 17  CREATININE 1.45* 1.12 0.92 0.92 0.69  CALCIUM 8.3* 8.0* 8.0* 8.3* 8.3*  MG 2.3 1.8 1.6* 1.5* 1.8     Recent Results (from the past 240 hour(s))  CSF culture     Status: None   Collection Time: 12/04/17 10:28 AM  Result Value Ref Range Status   Specimen Description CSF  Final   Special Requests NONE  Final   Gram Stain   Final    WBC PRESENT, PREDOMINANTLY MONONUCLEAR YEAST PRESENT CYTOSPIN SMEAR CRITICAL RESULT CALLED TO, READ BACK BY AND VERIFIED WITH: POWERS,A @ 1209 ON [id number] BY POTEAT,S    Culture FEW CRYPTOCOCCUS NEOFORMANS  Final   Report Status 12/08/2017 FINAL  Final  Fungus Culture With Stain     Status: None (Preliminary result)   Collection Time: 12/04/17 10:28 AM  Result Value Ref Range Status   Fungus Stain Final report  Final    Comment: (NOTE) Performed At: [facility] [address] [place], [place] [id number] Jolene Schimke MD BJ:[id number]    Fungus (Mycology) Culture PENDING  Incomplete  Fungal Source PENDING  Incomplete  Fungus Culture Result     Status: None   Collection Time: 12/04/17 10:28 AM  Result Value Ref Range Status   Result 1 Comment  Final    Comment: (NOTE) Fungal elements, such as arthroconidia, hyphal fragments, chlamydoconidia, observed. POSITIVE SMEAR REPORTED TO NICHOLE M. AT 1024 ON 12/08/17 BY AM. Performed At: [facility] [address] [place], [place] [id number] Jolene SchimkeNagendra Sanjai MD WU:[id number]:[phone]      Liver Function Tests: Recent Labs  Lab 12/07/17 0458  AST 57*  ALT 102*  ALKPHOS 104  BILITOT 0.4  PROT 6.6  ALBUMIN 2.6*   No results for input(s): LIPASE, AMYLASE in the last 168 hours. No results for input(s): AMMONIA in the last 168 hours.  Cardiac Enzymes: No results for input(s): CKTOTAL, CKMB, CKMBINDEX, TROPONINI in the last 168 hours. BNP (last 3 results) No results for input(s): BNP in the last 8760 hours.  ProBNP (last 3 results) No results for input(s): PROBNP in the last 8760 hours.    Studies: No results found.  Scheduled Meds: . carvedilol  3.125 mg Oral BID WC  . dextrose   10 mL Intravenous Q24H  . dextrose  10 mL Intravenous Q24H  . docusate  25 mg Both EARS BID  . feeding supplement (ENSURE ENLIVE)  237 mL Oral TID BM  . feeding supplement (PRO-STAT SUGAR FREE 64)  30 mL Oral TID WC  . flucytosine  25 mg/kg Oral Q6H  . megestrol  40 mg Oral Daily  . multivitamin with minerals  1 tablet Oral Daily  . sodium chloride  1,000 mL Intravenous Q24H  . sodium chloride  1,000 mL Intravenous Q24H  . sodium chloride flush  3 mL Intravenous Q12H  . sulfamethoxazole-trimethoprim  1 tablet Oral Daily      Time spent: 25 min  Meredeth IdeGagan S Emiah Pellicano   [facility] Pager [phone]. If 7PM-7AM, please contact night-coverage at [email or web address], Office  [phone]  password [email or web address]  12/10/2017, 1:21 PM  LOS: 13 days

## 2017-12-10 NOTE — Progress Notes (Signed)
PHARMACY BRIEF NOTE:   ELECTROLYTE REPLACEMENT PER LIPSOMAL AMPHOTERICIN B PROTOCOL  Ambisome 150 mg (dose reduced to ~3 mg/kg per ID d/t renal insufficency) IV q24h in combination with flucytosine 1250 mg (25 mg/kg/dose) PO q6h  for cryptococcal meningitis. ID service was consulted and is actively following.  Recent Labs    12/09/17 0423 12/10/17 0413  NA 132* 131*  K 4.0 4.0  CL 105 105  CO2 23 21*  GLUCOSE 97 94  BUN 26* 17  CREATININE 0.92 0.69  CALCIUM 8.3* 8.3*  MG 1.5* 1.8    Assessment: - Potassium = 4  - Magnesium 1.8 (s/p 4g Magnesium Sulfate IV on 1/22, goal > 2) - SCr 0.69, now WNL/stable. UOP 3.4 ml/kg/hr   Plan, per Amphotericin B Protocol: - Magnesium Sulfate 2 grams IV x 1 this AM  - Continue NS 1000 mL (increased from 500 mL per ID) bolus IV before and after each infusion of Ambisome  - Continue daily BMET, Mag - Monitor SCr, UOP   Greer PickerelJigna Moani Weipert, PharmD, BCPS Pager: [phone] 12/10/2017 7:55 AM

## 2017-12-10 NOTE — Progress Notes (Signed)
CSW following to assist with discharge planning. PT recommending SNF, patient also receiving IV antibiotics. Patient currently has no bed offers. CSW contacted [facility] to inquire about ability to offer patient a bed. Staff reported that they currently have no beds available but will review patient's referral and contact CSW if bed offer is able to be made. CSW contacted [facility] and spoke with staff member Tresa EndoKelly about ability to offer patient a bed. Staff reported that she was unsure if they have any medicaid beds available, staff agreed to review patient's referral and contact CSW with update. CSW will continue to follow and assist with discharge planning.  Celso SickleKimberly  Jon, [place]WA Clinical Social Worker [facility] Cell#: [phone]

## 2017-12-11 ENCOUNTER — Inpatient Hospital Stay (HOSPITAL_COMMUNITY): Payer: Medicaid Other

## 2017-12-11 LAB — BASIC METABOLIC PANEL
Anion gap: 5 (ref 5–15)
BUN: 16 mg/dL (ref 6–20)
CALCIUM: 8.3 mg/dL — AB (ref 8.9–10.3)
CO2: 20 mmol/L — ABNORMAL LOW (ref 22–32)
CREATININE: 0.79 mg/dL (ref 0.61–1.24)
Chloride: 106 mmol/L (ref 101–111)
GFR calc Af Amer: >60 mL/min (ref >60)
Glucose, Bld: 97 mg/dL (ref 65–99)
Potassium: 4.2 mmol/L (ref 3.5–5.1)
SODIUM: 131 mmol/L — AB (ref 135–145)

## 2017-12-11 LAB — CSF CELL COUNT WITH DIFFERENTIAL
LYMPHS CSF: 60 % (ref 40–80)
MONOCYTE-MACROPHAGE-SPINAL FLUID: 40 % (ref 15–45)
RBC Count, CSF: 9 /mm3 — ABNORMAL HIGH
Tube #: 4
WBC CSF: 23 /mm3 — AB (ref 0–5)

## 2017-12-11 LAB — MAGNESIUM: MAGNESIUM: 1.7 mg/dL (ref 1.7–2.4)

## 2017-12-11 LAB — CRYPTOCOCCAL ANTIGEN, CSF
Crypto Ag: POSITIVE — AB
Cryptococcal Ag Titer: 2560 — AB

## 2017-12-11 LAB — PROTEIN, CSF: Total  Protein, CSF: 51 mg/dL — ABNORMAL HIGH (ref 15–45)

## 2017-12-11 LAB — GLUCOSE, CSF: GLUCOSE CSF: 25 mg/dL — AB (ref 40–70)

## 2017-12-11 MED ORDER — LIDOCAINE HCL 1 % IJ SOLN
INTRAMUSCULAR | Status: AC
  Filled 2017-12-11: qty 20

## 2017-12-11 MED ORDER — MAGNESIUM SULFATE 4 GM/100ML IV SOLN
4.0000 g | Freq: Once | INTRAVENOUS | Status: AC
  Administered 2017-12-11: 4 g via INTRAVENOUS
  Filled 2017-12-11: qty 100

## 2017-12-11 MED ORDER — ENOXAPARIN SODIUM 40 MG/0.4ML ~~LOC~~ SOLN
40.0000 mg | SUBCUTANEOUS | Status: DC
  Administered 2017-12-12 – 2017-12-22 (×9): 40 mg via SUBCUTANEOUS
  Filled 2017-12-11 (×9): qty 0.4

## 2017-12-11 NOTE — Progress Notes (Signed)
PT Cancellation Note  Patient Details Name: Jacolyn Reedylonzo Gotto MRN: [id number] DOB: 1961-06-05   Cancelled Treatment:    Reason Eval/Treat Not Completed: Other (comment).  Pt lying on his back still eating this afternoon and was not ready for therapy.  Will try later as time and pt allow.   Ivar DrapeRuth E Tamyah Cutbirth 12/11/2017, 4:43 PM   Samul Dadauth Trayveon Beckford, PT MS Acute Rehab Dept. Number: [facility] [phone] and [facility] [phone]

## 2017-12-11 NOTE — Progress Notes (Signed)
[facility]  PROGRESS NOTE  Kenneth Rivas ZOX:[id number] DOB: 08/21/1961 DOA: 11/26/2017 PCP: Randall Hiss, MD   Brief HPI:   57 y.o. male with prior history of HIV/AIDS-last CD4 count less than 10, history of cryptococcal meningitis in 2018 (not very compliant with antifungals) admitted with generalized weakness, near syncopal event. Patient was noted to have a prolonged QTC, and was also found to have recurrent/persistent cryptococcal meningitis and admitted to the hospitalist service. See below for further details    Subjective   Patient seen and examined, denies headache. Plan for  repeat LP today.   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 Final fungal culture is pending. Infectious disease following. Plan to continue current treatment with repeat lumbar puncture today. 2. Hyponatremia- today sodium is 131.  3. Acute kidney injury-resolved likely from acyclovir, improved IV fluids. ACE inhibitor and Lasix are currently hold. Bactrim was held for 2 days. 4. HIV/AIDS-ID following. A arteries have been deferred in the setting of redo induction of cryptococcal meningitis. Continue Bactrim for PCP prophylaxis, he was off Zithromax for MAI prophylaxis due to prolonged QTC.He remains quite noncompliant with his medications, per nursing staff is throwing his pills under the bed on a consistent basis. Zithromax has been restarted. 5. Near syncope- secondary to dehydration/orthostatic hypotension. QTC has normalized. 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 6. Nonischemic cardiomyopathy- He remains compensated-continue with Coreg and ACE inhibitor-appreciate cardiology input.  Will need outpatient follow-up with cardiology. 7. QT prolongation- resolved 8. Severe protein calorie malnutrition- continue  supplements 9. Hypertension- continue coreg, Lisinopril, Lasix. 10. Anemia/leukopenia- stable, likely secondary to HIV.      DVT prophylaxis: Lovenox  Code Status: Full code  Family Communication: No family at bedside   Disposition Plan: Discharge after next LP on 12/11/2017, case management and social worker both requested to arrange for safe disposition.       Consultants:  Infectious disease  Procedures:  Lumbar puncture  Continuous infusions . amphotericin  B  Liposome (AMBISOME) ADULT IV Stopped (12/10/17 2059)      Antibiotics:   Anti-infectives (From admission, onward)   Start     Dose/Rate Route Frequency Ordered Stop   12/15/17 1000  azithromycin (ZITHROMAX) tablet 1,200 mg     1,200 mg Oral Weekly 12/10/17 1519     12/10/17 1000  sulfamethoxazole-trimethoprim (BACTRIM DS,SEPTRA DS) 800-160 MG per tablet 1 tablet     1 tablet Oral Daily 12/06/17 1039     12/08/17 1800  azithromycin (ZITHROMAX) tablet 1,200 mg     1,200 mg Oral  Once 12/08/17 1707 12/08/17 1844   12/06/17 1800  amphotericin B liposome (AMBISOME) 150 mg in dextrose 5 % 500 mL IVPB     3 mg/kg  49.6 kg 250 mL/hr over 120 Minutes Intravenous Every 24 hours 12/06/17 1147     12/04/17 1730  amphotericin B liposome (AMBISOME) 200 mg in dextrose 5 % 500 mL IVPB  Status:  Discontinued     200 mg 250 mL/hr over 120 Minutes Intravenous Every 24 hours 12/04/17 1350 12/06/17 1147   11/27/17 1800  flucytosine (ANCOBON) capsule 1,250 mg     25 mg/kg  47 kg Oral Every 6 hours 11/27/17 1615     11/27/17 1730  amphotericin B liposome (AMBISOME) 240 mg in dextrose 5 % 500 mL IVPB  Status:  Discontinued  5 mg/kg  47 kg 250 mL/hr over 120 Minutes Intravenous Every 24 hours 11/27/17 1615 12/04/17 1350   11/27/17 1000  sulfamethoxazole-trimethoprim (BACTRIM DS,SEPTRA DS) 800-160 MG per tablet 1 tablet  Status:  Discontinued     1 tablet Oral Daily 11/27/17 0839 12/06/17 1039       Objective    Vitals:   12/10/17 1756 12/10/17 2055 12/10/17 2159 12/11/17 0423  BP: (!) 157/98 140/88  (!) 140/92  Pulse: 96 92  94  Resp:    18  Temp:  100.1 F (37.8 C) 99.5 F (37.5 C) 98.9 F (37.2 C)  TempSrc:  Oral Oral Oral  SpO2:  99%  100%  Weight:    48.2 kg (106 lb 4.2 oz)  Height:        Intake/Output Summary (Last 24 hours) at 12/11/2017 1237 Last data filed at 12/11/2017 0600 Gross per 24 hour  Intake 1980 ml  Output 652 ml  Net 1328 ml   Filed Weights   12/08/17 0428 12/10/17 0419 12/11/17 0423  Weight: 49.3 kg (108 lb 11 oz) 47.8 kg (105 lb 6.1 oz) 48.2 kg (106 lb 4.2 oz)     Physical Examination:   Physical Exam: Eyes: No icterus, extraocular muscles intact  Mouth: Oral mucosa is moist, no lesions on palate,  Neck: Supple, no deformities, masses, or tenderness Lungs: Normal respiratory effort, bilateral clear to auscultation, no crackles or wheezes.  Heart: Regular rate and rhythm, S1 and S2 normal, no murmurs, rubs auscultated Abdomen: BS normoactive,soft,nondistended,non-tender to palpation,no organomegaly Extremities: No pretibial edema, no erythema, no cyanosis, no clubbing Neuro : Alert and oriented to time, place and person, No focal deficits      Data Reviewed: I have personally reviewed following labs and imaging studies  CBG: No results for input(s): GLUCAP in the last 168 hours.  CBC: Recent Labs  Lab 12/06/17 0813 12/09/17 0423  WBC 2.8* 3.2*  NEUTROABS  --  2.2  HGB 8.4* 8.3*  HCT 24.8* 24.2*  MCV 80.0 80.9  PLT 182 192    Basic Metabolic Panel: Recent Labs  Lab 12/07/17 0458 12/08/17 0443 12/09/17 0423 12/10/17 0413 12/11/17 0418  NA 129* 133* 132* 131* 131*  K 4.1 3.9 4.0 4.0 4.2  CL 103 107 105 105 106  CO2 22 23 23  21* 20*  GLUCOSE 91 91 97 94 97  BUN 24* 15 26* 17 16  CREATININE 1.12 0.92 0.92 0.69 0.79  CALCIUM 8.0* 8.0* 8.3* 8.3* 8.3*  MG 1.8 1.6* 1.5* 1.8 1.7    Recent Results (from the past 240 hour(s))  CSF  culture     Status: None   Collection Time: 12/04/17 10:28 AM  Result Value Ref Range Status   Specimen Description CSF  Final   Special Requests NONE  Final   Gram Stain   Final    WBC PRESENT, PREDOMINANTLY MONONUCLEAR YEAST PRESENT CYTOSPIN SMEAR CRITICAL RESULT CALLED TO, READ BACK BY AND VERIFIED WITH: POWERS,A @ 1209 ON [id number] BY POTEAT,S    Culture FEW CRYPTOCOCCUS NEOFORMANS  Final   Report Status 12/08/2017 FINAL  Final  Fungus Culture With Stain     Status: None (Preliminary result)   Collection Time: 12/04/17 10:28 AM  Result Value Ref Range Status   Fungus Stain Final report  Final   Fungus (Mycology) Culture Comment  Final    Comment: (NOTE) Yeast isolated, identification in progress. SPOKE TO MELONIA M IN MICROBIOLOGY 12/10/2017 @108P  YB Performed At: [facility]  [address] [place], [place] [id number] Jolene SchimkeNagendra Sanjai MD AO:[id number]    Fungal Source PENDING  Incomplete  Fungus Culture Result     Status: None   Collection Time: 12/04/17 10:28 AM  Result Value Ref Range Status   Result 1 Comment  Final    Comment: (NOTE) Fungal elements, such as arthroconidia, hyphal fragments, chlamydoconidia, observed. POSITIVE SMEAR REPORTED TO NICHOLE M. AT 1024 ON 12/08/17 BY AM. Performed At: [facility] [address] [place], [place] [id number] Jolene SchimkeNagendra Sanjai MD LK:[id number]      Liver Function Tests: Recent Labs  Lab 12/07/17 0458  AST 57*  ALT 102*  ALKPHOS 104  BILITOT 0.4  PROT 6.6  ALBUMIN 2.6*   No results for input(s): LIPASE, AMYLASE in the last 168 hours. No results for input(s): AMMONIA in the last 168 hours.  Cardiac Enzymes: No results for input(s): CKTOTAL, CKMB, CKMBINDEX, TROPONINI in the last 168 hours. BNP (last 3 results) No results for input(s): BNP in the last 8760 hours.  ProBNP (last 3 results) No results for input(s): PROBNP in the last 8760 hours.    Studies: No results found.  Scheduled Meds: .  [START ON 12/15/2017] azithromycin  1,200 mg Oral Weekly  . carvedilol  3.125 mg Oral BID WC  . dextrose  10 mL Intravenous Q24H  . dextrose  10 mL Intravenous Q24H  . docusate  25 mg Both EARS BID  . [START ON 12/12/2017] enoxaparin (LOVENOX) injection  40 mg Subcutaneous Q24H  . feeding supplement (ENSURE ENLIVE)  237 mL Oral TID BM  . feeding supplement (PRO-STAT SUGAR FREE 64)  30 mL Oral TID WC  . flucytosine  25 mg/kg Oral Q6H  . megestrol  40 mg Oral Daily  . multivitamin with minerals  1 tablet Oral Daily  . sodium chloride  1,000 mL Intravenous Q24H  . sodium chloride  1,000 mL Intravenous Q24H  . sodium chloride flush  3 mL Intravenous Q12H  . sulfamethoxazole-trimethoprim  1 tablet Oral Daily      Time spent: 25 min  Meredeth IdeGagan S Charyl Minervini   [facility] Pager [phone]. If 7PM-7AM, please contact night-coverage at [email or web address], Office  [phone]  password [email or web address]  12/11/2017, 12:37 PM  LOS: 14 days

## 2017-12-11 NOTE — Progress Notes (Signed)
PHARMACY BRIEF NOTE:   ELECTROLYTE REPLACEMENT PER LIPSOMAL AMPHOTERICIN B PROTOCOL  Ambisome 150 mg (dose reduced to ~3 mg/kg per ID d/t renal insufficency) IV q24h in combination with flucytosine 1250 mg (25 mg/kg/dose) PO q6h  for cryptococcal meningitis. ID service was consulted and is actively following.  Recent Labs    12/10/17 0413 12/11/17 0418  NA 131* 131*  K 4.0 4.2  CL 105 106  CO2 21* 20*  GLUCOSE 94 97  BUN 17 16  CREATININE 0.69 0.79  CALCIUM 8.3* 8.3*  MG 1.8 1.7    Assessment: - Potassium = 4.2 - Magnesium 1.7 (s/p 2g Magnesium Sulfate IV on 1/24, goal > 2) - SCr 0.79, now WNL/stable. UOP 3.4 ml/kg/hr   Plan, per Amphotericin B Protocol: - Magnesium Sulfate 4grams IV x 1 this AM  - Continue NS 1000 mL (increased from 500 mL per ID) bolus IV before and after each infusion of Ambisome  - Continue daily BMET, Mag - Monitor SCr, UOP  Herby AbrahamMichelle T. Jemario Poitras, Pharm.D. [phone] 12/11/2017 7:14 AM

## 2017-12-11 NOTE — Progress Notes (Signed)
CRITICAL VALUE ALERT  Critical Value:  Spinal fluid positive for Cryptococcal AG  Date & Time Notied:  12/11/17@2005   Provider Notified: Yes  Orders Received/Actions taken:

## 2017-12-11 NOTE — Progress Notes (Signed)
[facility]    Date of Admission:  11/26/2017   Total days of antibiotics 15        Day 15 L-ampho/flucytosine        Day 15 bactrim          ID: Kenneth Rivas is a 57 y.o. male with  Advanced poorly controlled hiv disease/AIDS, with relapse Cryptococcal meningitis Principal Problem:   Cryptococcal meningoencephalitis (HCC) Active Problems:   HTN (hypertension)   Cachexia associated with AIDS (HCC)   Hypokalemia   Hyponatremia   Protein-calorie malnutrition, severe   AIDS (acquired immune deficiency syndrome) (HCC)   Prolonged QT interval   Near syncope   Cryptococcal meningitis (HCC)   Anemia   Abnormal echocardiogram   Cardiomyopathy [facility](HCC): Per 2 d echo 11/28/2017    Subjective: Afebrile, denies HA. Underwent repeat LP found to have opening pressure of 38 (interestingly his OP was 12 on 1/18). Prelim cell count shows glu 25 (poor prognostic factor) TP of 51.   Has improved appetite  Medications:  . [START ON 12/15/2017] azithromycin  1,200 mg Oral Weekly  . carvedilol  3.125 mg Oral BID WC  . dextrose  10 mL Intravenous Q24H  . dextrose  10 mL Intravenous Q24H  . docusate  25 mg Both EARS BID  . [START ON 12/12/2017] enoxaparin (LOVENOX) injection  40 mg Subcutaneous Q24H  . feeding supplement (ENSURE ENLIVE)  237 mL Oral TID BM  . feeding supplement (PRO-STAT SUGAR FREE 64)  30 mL Oral TID WC  . flucytosine  25 mg/kg Oral Q6H  . lidocaine      . megestrol  40 mg Oral Daily  . multivitamin with minerals  1 tablet Oral Daily  . sodium chloride  1,000 mL Intravenous Q24H  . sodium chloride  1,000 mL Intravenous Q24H  . sodium chloride flush  3 mL Intravenous Q12H  . sulfamethoxazole-trimethoprim  1 tablet Oral Daily    Objective: Vital signs in last 24 hours: Temp:  [98.7 F (37.1 C)-100.1 F (37.8 C)] 98.7 F (37.1 C) (01/25 1500) Pulse Rate:  [92-96] 92 (01/25 1500) Resp:  [18] 18 (01/25 1500) BP: (128-157)/(82-98) 128/82 (01/25  1500) SpO2:  [99 %-100 %] 100 % (01/25 1500) Weight:  [106 lb 4.2 oz (48.2 kg)] 106 lb 4.2 oz (48.2 kg) (01/25 0423) Physical Exam  Constitutional: He is oriented to person, place.. He appears chronically ill, disheveled, emaciated. Hard of hearing. No distress.  HENT:  Mouth/Throat: Oropharynx is clear and moist. No oropharyngeal exudate.  Cardiovascular: Normal rate, regular rhythm and normal heart sounds. Exam reveals no gallop and no friction rub.  No murmur heard.  Pulmonary/Chest: Effort normal and breath sounds normal. No respiratory distress. He has no wheezes.  Abdominal: Soft. Bowel sounds are normal. He exhibits no distension. There is no tenderness.  Lymphadenopathy:  He has no cervical adenopathy.  Neurological: He is alert and oriented to person, place, and time.  Skin: Skin is warm and dry. No rash noted. No erythema.  Psychiatric: He has a normal mood and affect. His behavior is normal.    Lab Results Recent Labs    12/09/17 0423 12/10/17 0413 12/11/17 0418  WBC 3.2*  --   --   HGB 8.3*  --   --   HCT 24.2*  --   --   NA 132* 131* 131*  K 4.0 4.0 4.2  CL 105 105 106  CO2 23 21* 20*  BUN 26*  17 16  CREATININE 0.92 0.69 0.79    Microbiology: 1/18 + c.neoformans (growth confirmed on 1/22) 1/11 +c.neoformans (growht confirmed on 1/14)  Studies/Results: Dg Fluoro Guide Lumbar Puncture  Result Date: 12/11/2017 CLINICAL DATA:  Cryptococcal meningitis EXAM: DIAGNOSTIC LUMBAR PUNCTURE UNDER FLUOROSCOPIC GUIDANCE FLUOROSCOPY TIME:  Fluoroscopy Time:  45 sec Radiation Exposure Index (if provided by the fluoroscopic device): 11.41 mGy Number of Acquired Spot Images: 0 PROCEDURE: Informed consent was obtained from the patient prior to the procedure, including potential complications of headache, allergy, and pain. With the patient prone, the lower back was prepped with Betadine. 1% Lidocaine was used for local anesthesia. Lumbar puncture was performed at the L3-4 level  using a 20 gauge needle with return of clear CSF with an opening pressure of 38 cm water. Eight ml of CSF were obtained for laboratory studies. The patient tolerated the procedure well and there were no apparent complications. IMPRESSION: Successful fluoroscopic guided lumbar puncture as above. Electronically Signed   By: Charlett Nose M.D.   On: 12/11/2017 15:05     Assessment/Plan: Cryptococcal meningitis = we will continue on liposomal ampho(3mg /kg/day) plus flucytosine for additional 5 days, then repeat LP for opening pressure. But also consider repeat cx at that time. We will decide to stop IV treatment once we can sterilize his CSF or if he doesn't tolerate IV abtx. We are likely going to need to treat him for 3-4 wk of IV therapy given the burden of infection - then transition to oral fluconazole  OI Proph = continue with daily DS bactrim plus weekly azithromycin IV  Severe protein-calorie malnutrition = continue with protein supplementation. Also may need assistance with eating.  Will check back on Monday. If questions, arise over the weekend, please page dr Zenaida Niece dam  [facility] Cell: [phone] Pager: [phone]  12/11/2017, 4:01 PM

## 2017-12-11 NOTE — Plan of Care (Signed)
Will con to  mon 

## 2017-12-11 NOTE — Progress Notes (Signed)
CRITICAL VALUE ALERT  Critical Value:  SPINAL FLUID GLUCOSE 25  Date & Time Notied:  1548 12/11/17  Provider Notified: LAMA  Orders Received/Actions taken:

## 2017-12-11 NOTE — Progress Notes (Signed)
Called pt sister theresa. Aggie Cosierheresa aware that LP will be around 2pm and she will have phone with her to give consent. Pt refuses to ambulate because he is hungry. Explained to pt that he is NPO for procedure. Pt still refuses. Will continue to encourage

## 2017-12-11 NOTE — Progress Notes (Signed)
[facility] follow pt [facility] not [facility]

## 2017-12-12 LAB — BASIC METABOLIC PANEL
ANION GAP: 6 (ref 5–15)
BUN: 17 mg/dL (ref 6–20)
CHLORIDE: 100 mmol/L — AB (ref 101–111)
CO2: 17 mmol/L — ABNORMAL LOW (ref 22–32)
Calcium: 7.7 mg/dL — ABNORMAL LOW (ref 8.9–10.3)
Creatinine, Ser: 0.77 mg/dL (ref 0.61–1.24)
GFR calc Af Amer: >60 mL/min (ref >60)
Glucose, Bld: 95 mg/dL (ref 65–99)
POTASSIUM: 4 mmol/L (ref 3.5–5.1)
SODIUM: 123 mmol/L — AB (ref 135–145)

## 2017-12-12 LAB — MAGNESIUM: MAGNESIUM: 1.9 mg/dL (ref 1.7–2.4)

## 2017-12-12 MED ORDER — MAGNESIUM SULFATE 2 GM/50ML IV SOLN
2.0000 g | Freq: Once | INTRAVENOUS | Status: AC
  Administered 2017-12-12: 2 g via INTRAVENOUS
  Filled 2017-12-12: qty 50

## 2017-12-12 NOTE — Progress Notes (Signed)
PHARMACY BRIEF NOTE:   ELECTROLYTE REPLACEMENT PER LIPSOMAL AMPHOTERICIN B PROTOCOL  Ambisome 150 mg (dose reduced to ~3 mg/kg per ID earlier on admission d/t renal insufficency) IV q24h in combination with flucytosine 1250 mg (25 mg/kg/dose) PO q6h  for cryptococcal meningitis. ID service consulted and is actively following.  Recent Labs    12/11/17 0418 12/12/17 0506  NA 131* 123*  K 4.2 4.0  CL 106 100*  CO2 20* 17*  GLUCOSE 97 95  BUN 16 17  CREATININE 0.79 0.77  CALCIUM 8.3* 7.7*  MG 1.7 1.9    Assessment: - Potassium = 4 - Magnesium 1.9 (s/p 4g Magnesium Sulfate IV on 1/25, goal > 2) - SCr 0.77, now WNL/stable. UOP over past 24 hours documented as 1100mL + 2 unmeasured occurences   Plan, per Amphotericin B Protocol: - Magnesium Sulfate 2 grams IV x 1 this AM  - Continue NS 1000 mL (increased from 500 mL per ID) bolus IV before and after each infusion of Ambisome  - Continue daily BMET, Mag - Monitor SCr, UOP   Greer PickerelJigna Alacia Rehmann, PharmD, BCPS Pager: [phone] 12/12/2017 9:37 AM

## 2017-12-12 NOTE — Progress Notes (Signed)
Occupational Therapy Treatment Patient Details Name: Kenneth Rivas MRN: [id number] DOB: [birth year] Today's Date: 12/12/2017    History of present illness 57 yo male admitted with weakness, near syncopal episode.  Dx:  cryptococcal meningoencephalitis.  Hx of HIV/AIDS, Hep B, drug abuse, noncompliance, cachexia, anemia.    OT comments  Pt. Able to complete self feeding and light grooming task with set up/min a and mod. Instructional and demonstrational cues.  Will continue to see acutely.    Follow Up Recommendations  SNF    Equipment Recommendations  3 in 1 bedside commode    Recommendations for Other Services      Precautions / Restrictions Precautions Precautions: Fall Precaution Comments: unsteady gait       Mobility Bed Mobility               General bed mobility comments: seated in recliner for duration of session  Transfers                      Balance                                           ADL either performed or assessed with clinical judgement   ADL Overall ADL's : Needs assistance/impaired Eating/Feeding: Set up;Minimal assistance;Sitting;Cueing for safety;Cueing for sequencing Eating/Feeding Details (indicate cue type and reason): noted unsteadiness with hands difficult for pt. to open cracker packages.  provided instructions and demo for greater ease. pt. not able to return demo and cont. to open packages with teeth.  reviewed this technique could hurt his teeth and mouth. pt. cont. to open packages with mouth.  holding bowl in lap vs. tray.  able to self feed soup and crumble crackers into soup.  min. spillage of broth while bringing spoon to mouth approx. every other bite Grooming: Sitting;Set up;Cueing for sequencing(apply lotion to B UEs) Grooming Details (indicate cue type and reason): pt. required cues for thorough and full application of lotion to b ues.  pt. able to rub into hands and fingers with out cueing and rubbed  lotion on his face.                                      Vision       Perception     Praxis      Cognition Arousal/Alertness: Awake/alert Behavior During Therapy: WFL for tasks assessed/performed Overall Cognitive Status: Within Functional Limits for tasks assessed                                 [facility]        Exercises     Shoulder Instructions       General Comments      Pertinent Vitals/ Pain       Pain Assessment: No/denies pain  Home Living                                          Prior Functioning/Environment              Frequency  Min 2X/week  Progress Toward Goals  OT Goals(current goals can now be found in the care plan section)  Progress towards OT goals: Progressing toward goals     Plan      Co-evaluation                 AM-PAC PT "6 Clicks" Daily Activity     Outcome Measure   Help from another person eating meals?: A Little Help from another person taking care of personal grooming?: A Little Help from another person toileting, which includes using toliet, bedpan, or urinal?: A Lot Help from another person bathing (including washing, rinsing, drying)?: A Lot Help from another person to put on and taking off regular upper body clothing?: A Lot Help from another person to put on and taking off regular lower body clothing?: Total 6 Click Score: 13    End of Session    OT Visit Diagnosis: Muscle weakness (generalized) (M62.81)   Activity Tolerance Patient tolerated treatment well   Patient Left in chair;with call bell/phone within reach;with nursing/sitter in room   Nurse Communication          Time: 1610-9604 OT Time Calculation (min): 8 min  Charges: OT General Charges $OT Visit: 1 Visit OT Treatments $Self Care/Home Management : 8-22 mins  Robet Leu, COTA/L 12/12/2017, 11:39 AM

## 2017-12-12 NOTE — Progress Notes (Signed)
[facility]  PROGRESS NOTE  Kenneth Rivas WUJ:[id number] DOB: [birth year] DOA: 11/26/2017 PCP: Randall Hiss, MD   Brief HPI:   57 y.o. male with prior history of HIV/AIDS-last CD4 count less than 10, history of cryptococcal meningitis in 2018 (not very compliant with antifungals) admitted with generalized weakness, near syncopal event. Patient was noted to have a prolonged QTC, and was also found to have recurrent/persistent cryptococcal meningitis and admitted to the hospitalist service. See below for further details    Subjective   Patient seen and examined, denies any complaints this morning. CSF analysis showed glucose of 25 yesterday.   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 Final fungal culture is pending. Infectious disease following. Plan to continue current treatment and repeat LP in  five days for opening pressure and repeat culture. 2. Hyponatremia- today sodium is 123. Continue fluid restriction. BMP in am. 3. Acute kidney injury-resolved likely from acyclovir, improved with  IV fluids. ACE inhibitor and Lasix are currently hold. Bactrim was held for 2 days. 4. HIV/AIDS-ID following. A arteries have been deferred in the setting of redo induction of cryptococcal meningitis. Continue Bactrim for PCP prophylaxis, he was off Zithromax for MAI prophylaxis due to prolonged QTC.He remains quite noncompliant with his medications, per nursing staff is throwing his pills under the bed on a consistent basis. Zithromax has been restarted. 5. Near syncope- secondary to dehydration/orthostatic hypotension. QTC has normalized. 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 6. Nonischemic cardiomyopathy- He remains compensated-continue with Coreg and ACE inhibitor-appreciate cardiology input.  Will need  outpatient follow-up with cardiology. 7. QT prolongation- resolved 8. Severe protein calorie malnutrition- continue supplements 9. Hypertension- continue coreg, Lisinopril, Lasix. 10. Anemia/leukopenia- stable, likely secondary to HIV.      DVT prophylaxis: Lovenox  Code Status: Full code  Family Communication: No family at bedside   Disposition Plan: Discharge after next LP on 12/11/2017, case management and social worker both requested to arrange for safe disposition.       Consultants:  Infectious disease  Procedures:  Lumbar puncture  Continuous infusions . amphotericin  B  Liposome (AMBISOME) ADULT IV Stopped (12/11/17 2009)      Antibiotics:   Anti-infectives (From admission, onward)   Start     Dose/Rate Route Frequency Ordered Stop   12/15/17 1000  azithromycin (ZITHROMAX) tablet 1,200 mg     1,200 mg Oral Weekly 12/10/17 1519     12/10/17 1000  sulfamethoxazole-trimethoprim (BACTRIM DS,SEPTRA DS) 800-160 MG per tablet 1 tablet     1 tablet Oral Daily 12/06/17 1039     12/08/17 1800  azithromycin (ZITHROMAX) tablet 1,200 mg     1,200 mg Oral  Once 12/08/17 1707 12/08/17 1844   12/06/17 1800  amphotericin B liposome (AMBISOME) 150 mg in dextrose 5 % 500 mL IVPB     3 mg/kg  49.6 kg 250 mL/hr over 120 Minutes Intravenous Every 24 hours 12/06/17 1147     12/04/17 1730  amphotericin B liposome (AMBISOME) 200 mg in dextrose 5 % 500 mL IVPB  Status:  Discontinued     200 mg 250 mL/hr over 120 Minutes Intravenous Every 24 hours 12/04/17 1350 12/06/17 1147   11/27/17 1800  flucytosine (ANCOBON) capsule 1,250 mg     25 mg/kg  47 kg Oral Every 6 hours 11/27/17 1615     11/27/17 1730  amphotericin B  liposome (AMBISOME) 240 mg in dextrose 5 % 500 mL IVPB  Status:  Discontinued     5 mg/kg  47 kg 250 mL/hr over 120 Minutes Intravenous Every 24 hours 11/27/17 1615 12/04/17 1350   11/27/17 1000  sulfamethoxazole-trimethoprim (BACTRIM DS,SEPTRA DS) 800-160 MG per  tablet 1 tablet  Status:  Discontinued     1 tablet Oral Daily 11/27/17 0839 12/06/17 1039       Objective   Vitals:   12/11/17 0423 12/11/17 1500 12/11/17 2112 12/12/17 0557  BP: (!) 140/92 128/82 137/90 (!) 165/99  Pulse: 94 92 75 67  Resp: 18 18 18 20   Temp: 98.9 F (37.2 C) 98.7 F (37.1 C) 98.4 F (36.9 C) 98 F (36.7 C)  TempSrc: Oral Oral Oral Oral  SpO2: 100% 100% 100% 100%  Weight: 48.2 kg (106 lb 4.2 oz)     Height:        Intake/Output Summary (Last 24 hours) at 12/12/2017 1334 Last data filed at 12/12/2017 1304 Gross per 24 hour  Intake 2800 ml  Output 1100 ml  Net 1700 ml   Filed Weights   12/08/17 0428 12/10/17 0419 12/11/17 0423  Weight: 49.3 kg (108 lb 11 oz) 47.8 kg (105 lb 6.1 oz) 48.2 kg (106 lb 4.2 oz)     Physical Examination:   Physical Exam: Eyes: No icterus, extraocular muscles intact  Mouth: Oral mucosa is moist, no lesions on palate,  Neck: Supple, no deformities, masses, or tenderness Lungs: Normal respiratory effort, bilateral clear to auscultation, no crackles or wheezes.  Heart: Regular rate and rhythm, S1 and S2 normal, no murmurs, rubs auscultated Abdomen: BS normoactive,soft,nondistended,non-tender to palpation,no organomegaly Extremities: No pretibial edema, no erythema, no cyanosis, no clubbing Neuro : Alert and oriented to time, place and person, No focal deficits       Data Reviewed: I have personally reviewed following labs and imaging studies  CBG: No results for input(s): GLUCAP in the last 168 hours.  CBC: Recent Labs  Lab 12/06/17 0813 12/09/17 0423  WBC 2.8* 3.2*  NEUTROABS  --  2.2  HGB 8.4* 8.3*  HCT 24.8* 24.2*  MCV 80.0 80.9  PLT 182 192    Basic Metabolic Panel: Recent Labs  Lab 12/08/17 0443 12/09/17 0423 12/10/17 0413 12/11/17 0418 12/12/17 0506  NA 133* 132* 131* 131* 123*  K 3.9 4.0 4.0 4.2 4.0  CL 107 105 105 106 100*  CO2 23 23 21* 20* 17*  GLUCOSE 91 97 94 97 95  BUN 15 26* 17 16  17   CREATININE 0.92 0.92 0.69 0.79 0.77  CALCIUM 8.0* 8.3* 8.3* 8.3* 7.7*  MG 1.6* 1.5* 1.8 1.7 1.9    Recent Results (from the past 240 hour(s))  CSF culture     Status: None   Collection Time: 12/04/17 10:28 AM  Result Value Ref Range Status   Specimen Description CSF  Final   Special Requests NONE  Final   Gram Stain   Final    WBC PRESENT, PREDOMINANTLY MONONUCLEAR YEAST PRESENT CYTOSPIN SMEAR CRITICAL RESULT CALLED TO, READ BACK BY AND VERIFIED WITH: POWERS,A @ 1209 ON [id number] BY POTEAT,S    Culture FEW CRYPTOCOCCUS NEOFORMANS  Final   Report Status 12/08/2017 FINAL  Final  Fungus Culture With Stain     Status: None   Collection Time: 12/04/17 10:28 AM  Result Value Ref Range Status   Fungus Stain Final report  Final   Fungus (Mycology) Culture Preliminary report  Corrected  Comment: (NOTE) SPOKE TO MELONIA M IN MICROBIOLOGY 12/10/2017 @108P  YB Performed At: [facility] [address] [place], [place] [id number] Jolene SchimkeNagendra Sanjai MD WU:[id number]:[phone] CORRECTED ON 01/25 AT 1633: PREVIOUSLY REPORTED AS Comment    Fungal Source PENDING  Incomplete  Fungus Culture Result     Status: None   Collection Time: 12/04/17 10:28 AM  Result Value Ref Range Status   Result 1 Comment  Final    Comment: (NOTE) Fungal elements, such as arthroconidia, hyphal fragments, chlamydoconidia, observed. POSITIVE SMEAR REPORTED TO NICHOLE M. AT 1024 ON 12/08/17 BY AM. Performed At: [facility] [address] [place], [place] [id number] Jolene SchimkeNagendra Sanjai MD VH:[id number]:[phone]   Fungal organism reflex     Status: None   Collection Time: 12/04/17 10:28 AM  Result Value Ref Range Status   Fungal result 1 Comment  Final    Comment: (NOTE) Yeast isolated, identification in progress. Identification to follow. Performed At: [facility] [address] [place], [place] [id number] Jolene SchimkeNagendra Sanjai MD UV:[id number]:[phone]   CSF culture     Status: None (Preliminary result)   Collection  Time: 12/11/17  2:20 PM  Result Value Ref Range Status   Specimen Description CSF  Final   Special Requests NONE  Final   Gram Stain   Final    NO ORGANISMS SEEN MODERATE WBC PRESENT, PREDOMINANTLY MONONUCLEAR Gram Stain Report Called to,Read Back By and Verified With: S.BLACKWELL RN AT 1540 ON 12/11/17 BY S.VANHOORNE    Culture   Final    NO GROWTH 1 DAY Performed at [facility], [address] [place], [place] [address]    Report Status PENDING  Incomplete     Liver Function Tests: Recent Labs  Lab 12/07/17 0458  AST 57*  ALT 102*  ALKPHOS 104  BILITOT 0.4  PROT 6.6  ALBUMIN 2.6*   No results for input(s): LIPASE, AMYLASE in the last 168 hours. No results for input(s): AMMONIA in the last 168 hours.  Cardiac Enzymes: No results for input(s): CKTOTAL, CKMB, CKMBINDEX, TROPONINI in the last 168 hours. BNP (last 3 results) No results for input(s): BNP in the last 8760 hours.  ProBNP (last 3 results) No results for input(s): PROBNP in the last 8760 hours.    Studies: Dg Fluoro Guide Lumbar Puncture  Result Date: 12/11/2017 CLINICAL DATA:  Cryptococcal meningitis EXAM: DIAGNOSTIC LUMBAR PUNCTURE UNDER FLUOROSCOPIC GUIDANCE FLUOROSCOPY TIME:  Fluoroscopy Time:  45 sec Radiation Exposure Index (if provided by the fluoroscopic device): 11.41 mGy Number of Acquired Spot Images: 0 PROCEDURE: Informed consent was obtained from the patient prior to the procedure, including potential complications of headache, allergy, and pain. With the patient prone, the lower back was prepped with Betadine. 1% Lidocaine was used for local anesthesia. Lumbar puncture was performed at the L3-4 level using a 20 gauge needle with return of clear CSF with an opening pressure of 38 cm water. Eight ml of CSF were obtained for laboratory studies. The patient tolerated the procedure well and there were no apparent complications. IMPRESSION: Successful fluoroscopic guided lumbar puncture as above.  Electronically Signed   By: Charlett NoseKevin  Dover M.D.   On: 12/11/2017 15:05    Scheduled Meds: . [START ON 12/15/2017] azithromycin  1,200 mg Oral Weekly  . carvedilol  3.125 mg Oral BID WC  . dextrose  10 mL Intravenous Q24H  . dextrose  10 mL Intravenous Q24H  . docusate  25 mg Both EARS BID  . enoxaparin (LOVENOX) injection  40 mg  Subcutaneous Q24H  . feeding supplement (ENSURE ENLIVE)  237 mL Oral TID BM  . feeding supplement (PRO-STAT SUGAR FREE 64)  30 mL Oral TID WC  . flucytosine  25 mg/kg Oral Q6H  . megestrol  40 mg Oral Daily  . multivitamin with minerals  1 tablet Oral Daily  . sodium chloride  1,000 mL Intravenous Q24H  . sodium chloride  1,000 mL Intravenous Q24H  . sodium chloride flush  3 mL Intravenous Q12H  . sulfamethoxazole-trimethoprim  1 tablet Oral Daily      Time spent: 25 min  Meredeth Ide   [facility] Pager [phone]. If 7PM-7AM, please contact night-coverage at [email or web address], Office  [phone]  password [email or web address]  12/12/2017, 1:34 PM  LOS: 15 days

## 2017-12-13 LAB — BASIC METABOLIC PANEL
Anion gap: 4 — ABNORMAL LOW (ref 5–15)
BUN: 21 mg/dL — ABNORMAL HIGH (ref 6–20)
CALCIUM: 8.1 mg/dL — AB (ref 8.9–10.3)
CO2: 17 mmol/L — ABNORMAL LOW (ref 22–32)
Chloride: 110 mmol/L (ref 101–111)
Creatinine, Ser: 0.77 mg/dL (ref 0.61–1.24)
Glucose, Bld: 100 mg/dL — ABNORMAL HIGH (ref 65–99)
Potassium: 4.6 mmol/L (ref 3.5–5.1)
Sodium: 131 mmol/L — ABNORMAL LOW (ref 135–145)

## 2017-12-13 LAB — CBC
HCT: 24.5 % — ABNORMAL LOW (ref 39.0–52.0)
HEMOGLOBIN: 8.2 g/dL — AB (ref 13.0–17.0)
MCH: 27.2 pg (ref 26.0–34.0)
MCHC: 33.5 g/dL (ref 30.0–36.0)
MCV: 81.1 fL (ref 78.0–100.0)
Platelets: 181 10*3/uL (ref 150–400)
RBC: 3.02 MIL/uL — AB (ref 4.22–5.81)
RDW: 14.5 % (ref 11.5–15.5)
WBC: 3.1 10*3/uL — AB (ref 4.0–10.5)

## 2017-12-13 LAB — MAGNESIUM: MAGNESIUM: 1.7 mg/dL (ref 1.7–2.4)

## 2017-12-13 MED ORDER — MAGNESIUM SULFATE 4 GM/100ML IV SOLN
4.0000 g | Freq: Once | INTRAVENOUS | Status: AC
  Administered 2017-12-13: 4 g via INTRAVENOUS
  Filled 2017-12-13: qty 100

## 2017-12-13 NOTE — Progress Notes (Signed)
PHARMACY BRIEF NOTE:   ELECTROLYTE REPLACEMENT PER LIPOSOMAL AMPHOTERICIN B PROTOCOL  Ambisome 150 mg (dose reduced to ~3 mg/kg per ID earlier on admission d/t renal insufficency) IV q24h in combination with flucytosine 1250 mg (25 mg/kg/dose) PO q6h  for cryptococcal meningitis. ID service consulted and is actively following.  Recent Labs    12/12/17 0506 12/13/17 0407  NA 123* 131*  K 4.0 4.6  CL 100* 110  CO2 17* 17*  GLUCOSE 95 100*  BUN 17 21*  CREATININE 0.77 0.77  CALCIUM 7.7* 8.1*  MG 1.9 1.7    Assessment: - Potassium = 4.6 - Magnesium decreased to 1.7 despite Magnesium 2g IV supplementation on 1/26 (goal > 2) - SCr 0.77, now WNL/stable. UOP 2.9 mL/kg/hr   Plan, per Amphotericin B Protocol: - Magnesium Sulfate 4 grams IV x 1 today  - Continue NS 1000 mL (increased from 500 mL per ID) bolus IV before and after each infusion of Ambisome  - Continue daily BMET, Mag - Monitor SCr, UOP   Greer PickerelJigna Marelyn Rouser, PharmD, BCPS Pager: [phone] 12/13/2017 11:16 AM

## 2017-12-13 NOTE — Progress Notes (Signed)
[facility]  PROGRESS NOTE  Kenneth Rivas ZOX:[id number] DOB: [birth year] DOA: 11/26/2017 PCP: Randall Hiss, MD   Brief HPI:   57 y.o. male with prior history of HIV/AIDS-last CD4 count less than 10, history of cryptococcal meningitis in 2018 (not very compliant with antifungals) admitted with generalized weakness, near syncopal event. Patient was noted to have a prolonged QTC, and was also found to have recurrent/persistent cryptococcal meningitis and admitted to the hospitalist service. See below for further details    Subjective   Patient seen and examined, denies headache or chest pain.   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 Final fungal culture is pending. Infectious disease following. Plan to continue current treatment and repeat LP in  five days for opening pressure and repeat culture. 2. Hyponatremia-improved, today sodium is 131, yesterday  sodium was 123. Continue fluid restriction. BMP in am. 3. Acute kidney injury-resolved likely from acyclovir, improved with  IV fluids. ACE inhibitor and Lasix are currently  On hold. Bactrim was held for 2 days. 4. HIV/AIDS-ID following. ART's have been deferred in the setting of redo induction of cryptococcal meningitis. Continue Bactrim for PCP prophylaxis, he was off Zithromax for MAI prophylaxis due to prolonged QTC.He remains quite noncompliant with his medications, per nursing staff is throwing his pills under the bed on a consistent basis. Zithromax has been restarted. 5. Near syncope- secondary to dehydration/orthostatic hypotension. QTC has normalized. 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 6. Nonischemic cardiomyopathy- He remains compensated-continue with Coreg and ACE inhibitor-appreciate cardiology input.  Will need outpatient  follow-up with cardiology. 7. QT prolongation- resolved 8. Severe protein calorie malnutrition- continue supplements 9. Hypertension- continue coreg, Lisinopril, Lasix. 10. Anemia/leukopenia- stable, likely secondary to HIV.      DVT prophylaxis: Lovenox  Code Status: Full code  Family Communication: No family at bedside   Disposition Plan: Discharge after next LP on 12/11/2017, case management and social worker both requested to arrange for safe disposition.       Consultants:  Infectious disease  Procedures:  Lumbar puncture  Continuous infusions . amphotericin  B  Liposome (AMBISOME) ADULT IV Stopped (12/12/17 1930)  . magnesium sulfate 1 - 4 g bolus IVPB 4 g (12/13/17 1158)      Antibiotics:   Anti-infectives (From admission, onward)   Start     Dose/Rate Route Frequency Ordered Stop   12/15/17 1000  azithromycin (ZITHROMAX) tablet 1,200 mg     1,200 mg Oral Weekly 12/10/17 1519     12/10/17 1000  sulfamethoxazole-trimethoprim (BACTRIM DS,SEPTRA DS) 800-160 MG per tablet 1 tablet     1 tablet Oral Daily 12/06/17 1039     12/08/17 1800  azithromycin (ZITHROMAX) tablet 1,200 mg     1,200 mg Oral  Once 12/08/17 1707 12/08/17 1844   12/06/17 1800  amphotericin B liposome (AMBISOME) 150 mg in dextrose 5 % 500 mL IVPB     3 mg/kg  49.6 kg 250 mL/hr over 120 Minutes Intravenous Every 24 hours 12/06/17 1147     12/04/17 1730  amphotericin B liposome (AMBISOME) 200 mg in dextrose 5 % 500 mL IVPB  Status:  Discontinued     200 mg 250 mL/hr over 120 Minutes Intravenous Every 24 hours 12/04/17 1350 12/06/17 1147   11/27/17 1800  flucytosine (ANCOBON) capsule 1,250 mg     25 mg/kg  47 kg Oral Every  6 hours 11/27/17 1615     11/27/17 1730  amphotericin B liposome (AMBISOME) 240 mg in dextrose 5 % 500 mL IVPB  Status:  Discontinued     5 mg/kg  47 kg 250 mL/hr over 120 Minutes Intravenous Every 24 hours 11/27/17 1615 12/04/17 1350   11/27/17 1000   sulfamethoxazole-trimethoprim (BACTRIM DS,SEPTRA DS) 800-160 MG per tablet 1 tablet  Status:  Discontinued     1 tablet Oral Daily 11/27/17 0839 12/06/17 1039       Objective   Vitals:   12/12/17 0557 12/12/17 1357 12/12/17 2032 12/13/17 0446  BP: (!) 165/99 125/84 (!) 141/84 129/81  Pulse: 67 84 79 97  Resp: 20 16 16 16   Temp: 98 F (36.7 C) 97.7 F (36.5 C) 98.4 F (36.9 C) 98.4 F (36.9 C)  TempSrc: Oral Oral Oral Oral  SpO2: 100% 100% 98% 99%  Weight:      Height:        Intake/Output Summary (Last 24 hours) at 12/13/2017 1234 Last data filed at 12/13/2017 1057 Gross per 24 hour  Intake 0981131230 ml  Output 4050 ml  Net 27180 ml   Filed Weights   12/08/17 0428 12/10/17 0419 12/11/17 0423  Weight: 49.3 kg (108 lb 11 oz) 47.8 kg (105 lb 6.1 oz) 48.2 kg (106 lb 4.2 oz)     Physical Examination:  Physical Exam: Eyes: No icterus, extraocular muscles intact  Mouth: Oral mucosa is moist, no lesions on palate,  Neck: Supple, no deformities, masses, or tenderness Lungs: Normal respiratory effort, bilateral clear to auscultation, no crackles or wheezes.  Heart: Regular rate and rhythm, S1 and S2 normal, no murmurs, rubs auscultated Abdomen: BS normoactive,soft,nondistended,non-tender to palpation,no organomegaly Extremities: No pretibial edema, no erythema, no cyanosis, no clubbing Neuro : Alert and oriented to time, place and person, No focal deficits Skin: No rashes seen on exam    Data Reviewed: I have personally reviewed following labs and imaging studies  CBG: No results for input(s): GLUCAP in the last 168 hours.  CBC: Recent Labs  Lab 12/09/17 0423 12/13/17 0407  WBC 3.2* 3.1*  NEUTROABS 2.2  --   HGB 8.3* 8.2*  HCT 24.2* 24.5*  MCV 80.9 81.1  PLT 192 181    Basic Metabolic Panel: Recent Labs  Lab 12/09/17 0423 12/10/17 0413 12/11/17 0418 12/12/17 0506 12/13/17 0407  NA 132* 131* 131* 123* 131*  K 4.0 4.0 4.2 4.0 4.6  CL 105 105 106 100* 110   CO2 23 21* 20* 17* 17*  GLUCOSE 97 94 97 95 100*  BUN 26* 17 16 17  21*  CREATININE 0.92 0.69 0.79 0.77 0.77  CALCIUM 8.3* 8.3* 8.3* 7.7* 8.1*  MG 1.5* 1.8 1.7 1.9 1.7    Recent Results (from the past 240 hour(s))  CSF culture     Status: None   Collection Time: 12/04/17 10:28 AM  Result Value Ref Range Status   Specimen Description CSF  Final   Special Requests NONE  Final   Gram Stain   Final    WBC PRESENT, PREDOMINANTLY MONONUCLEAR YEAST PRESENT CYTOSPIN SMEAR CRITICAL RESULT CALLED TO, READ BACK BY AND VERIFIED WITH: POWERS,A @ 1209 ON [id number] BY POTEAT,S    Culture FEW CRYPTOCOCCUS NEOFORMANS  Final   Report Status 12/08/2017 FINAL  Final  Fungus Culture With Stain     Status: None   Collection Time: 12/04/17 10:28 AM  Result Value Ref Range Status   Fungus Stain Final report  Final  Fungus (Mycology) Culture Preliminary report  Corrected    Comment: (NOTE) SPOKE TO MELONIA M IN MICROBIOLOGY 12/10/2017 @108P  YB Performed At: [facility] [address] [place], [place] [id number] Jolene Schimke MD WU:[id number] CORRECTED ON 01/25 AT 1633: PREVIOUSLY REPORTED AS Comment    Fungal Source PENDING  Incomplete  Fungus Culture Result     Status: None   Collection Time: 12/04/17 10:28 AM  Result Value Ref Range Status   Result 1 Comment  Final    Comment: (NOTE) Fungal elements, such as arthroconidia, hyphal fragments, chlamydoconidia, observed. POSITIVE SMEAR REPORTED TO NICHOLE M. AT 1024 ON 12/08/17 BY AM. Performed At: [facility] [address] [place], [place] [id number] Jolene Schimke MD VH:[id number]   Fungal organism reflex     Status: None   Collection Time: 12/04/17 10:28 AM  Result Value Ref Range Status   Fungal result 1 Comment  Final    Comment: (NOTE) Yeast isolated, identification in progress. Identification to follow. Performed At: [facility] [address] [place], [place] [id number] Jolene Schimke MD  UV:[id number]   CSF culture     Status: None (Preliminary result)   Collection Time: 12/11/17  2:20 PM  Result Value Ref Range Status   Specimen Description CSF  Final   Special Requests NONE  Final   Gram Stain   Final    NO ORGANISMS SEEN MODERATE WBC PRESENT, PREDOMINANTLY MONONUCLEAR Gram Stain Report Called to,Read Back By and Verified With: S.BLACKWELL RN AT 1540 ON 12/11/17 BY S.VANHOORNE    Culture   Final    NO GROWTH 2 DAYS Performed at [facility], [address], [place], [place] [address]    Report Status PENDING  Incomplete  Culture, fungus without smear     Status: None (Preliminary result)   Collection Time: 12/11/17  2:20 PM  Result Value Ref Range Status   Specimen Description CSF  Final   Special Requests NONE  Final   Culture   Final    NO FUNGUS ISOLATED AFTER 1 DAY Performed at [facility], [address], [place], [place] [address]    Report Status PENDING  Incomplete     Liver Function Tests: Recent Labs  Lab 12/07/17 0458  AST 57*  ALT 102*  ALKPHOS 104  BILITOT 0.4  PROT 6.6  ALBUMIN 2.6*   No results for input(s): LIPASE, AMYLASE in the last 168 hours. No results for input(s): AMMONIA in the last 168 hours.  Cardiac Enzymes: No results for input(s): CKTOTAL, CKMB, CKMBINDEX, TROPONINI in the last 168 hours. BNP (last 3 results) No results for input(s): BNP in the last 8760 hours.  ProBNP (last 3 results) No results for input(s): PROBNP in the last 8760 hours.    Studies: Dg Fluoro Guide Lumbar Puncture  Result Date: 12/11/2017 CLINICAL DATA:  Cryptococcal meningitis EXAM: DIAGNOSTIC LUMBAR PUNCTURE UNDER FLUOROSCOPIC GUIDANCE FLUOROSCOPY TIME:  Fluoroscopy Time:  45 sec Radiation Exposure Index (if provided by the fluoroscopic device): 11.41 mGy Number of Acquired Spot Images: 0 PROCEDURE: Informed consent was obtained from the patient prior to the procedure, including potential complications of headache, allergy, and  pain. With the patient prone, the lower back was prepped with Betadine. 1% Lidocaine was used for local anesthesia. Lumbar puncture was performed at the L3-4 level using a 20 gauge needle with return of clear CSF with an opening pressure of 38 cm water. Eight ml of CSF were obtained for laboratory studies. The patient tolerated the  procedure well and there were no apparent complications. IMPRESSION: Successful fluoroscopic guided lumbar puncture as above. Electronically Signed   By: Charlett Nose M.D.   On: 12/11/2017 15:05    Scheduled Meds: . [START ON 12/15/2017] azithromycin  1,200 mg Oral Weekly  . carvedilol  3.125 mg Oral BID WC  . dextrose  10 mL Intravenous Q24H  . dextrose  10 mL Intravenous Q24H  . docusate  25 mg Both EARS BID  . enoxaparin (LOVENOX) injection  40 mg Subcutaneous Q24H  . feeding supplement (ENSURE ENLIVE)  237 mL Oral TID BM  . feeding supplement (PRO-STAT SUGAR FREE 64)  30 mL Oral TID WC  . flucytosine  25 mg/kg Oral Q6H  . megestrol  40 mg Oral Daily  . multivitamin with minerals  1 tablet Oral Daily  . sodium chloride  1,000 mL Intravenous Q24H  . sodium chloride  1,000 mL Intravenous Q24H  . sodium chloride flush  3 mL Intravenous Q12H  . sulfamethoxazole-trimethoprim  1 tablet Oral Daily      Time spent: 25 min  Meredeth Ide   [facility] Pager [phone]. If 7PM-7AM, please contact night-coverage at [email or web address], Office  [phone]  password [email or web address]  12/13/2017, 12:34 PM  LOS: 16 days

## 2017-12-14 LAB — BASIC METABOLIC PANEL
ANION GAP: 6 (ref 5–15)
BUN: 18 mg/dL (ref 6–20)
CHLORIDE: 111 mmol/L (ref 101–111)
CO2: 16 mmol/L — AB (ref 22–32)
CREATININE: 0.7 mg/dL (ref 0.61–1.24)
Calcium: 8.7 mg/dL — ABNORMAL LOW (ref 8.9–10.3)
GFR calc Af Amer: >60 mL/min (ref >60)
GFR calc non Af Amer: >60 mL/min (ref >60)
Glucose, Bld: 96 mg/dL (ref 65–99)
POTASSIUM: 4.8 mmol/L (ref 3.5–5.1)
Sodium: 133 mmol/L — ABNORMAL LOW (ref 135–145)

## 2017-12-14 LAB — PATHOLOGIST SMEAR REVIEW

## 2017-12-14 LAB — MAGNESIUM: Magnesium: 1.8 mg/dL (ref 1.7–2.4)

## 2017-12-14 MED ORDER — MAGNESIUM SULFATE 4 GM/100ML IV SOLN
4.0000 g | Freq: Once | INTRAVENOUS | Status: AC
  Administered 2017-12-14: 4 g via INTRAVENOUS
  Filled 2017-12-14: qty 100

## 2017-12-14 NOTE — Progress Notes (Signed)
[facility]    Date of Admission:  11/26/2017   Total days of antibiotics 18        Day 18 of crypto meningitis tx           ID: Kenneth Rivas is a 57 y.o. male with advanced hiv disease, repeat induction tx for CM Principal Problem:   Cryptococcal meningoencephalitis (HCC) Active Problems:   HTN (hypertension)   Cachexia associated with AIDS (HCC)   Hypokalemia   Hyponatremia   Protein-calorie malnutrition, severe   AIDS (acquired immune deficiency syndrome) (HCC)   Prolonged QT interval   Near syncope   Cryptococcal meningitis (HCC)   Anemia   Abnormal echocardiogram   Cardiomyopathy (HCC): Per 2 d echo 11/28/2017    Subjective: Good appetite. Mild headache. Wants to go home.  Medications:  . [START ON 12/15/2017] azithromycin  1,200 mg Oral Weekly  . carvedilol  3.125 mg Oral BID WC  . dextrose  10 mL Intravenous Q24H  . dextrose  10 mL Intravenous Q24H  . docusate  25 mg Both EARS BID  . enoxaparin (LOVENOX) injection  40 mg Subcutaneous Q24H  . feeding supplement (ENSURE ENLIVE)  237 mL Oral TID BM  . feeding supplement (PRO-STAT SUGAR FREE 64)  30 mL Oral TID WC  . flucytosine  25 mg/kg Oral Q6H  . megestrol  40 mg Oral Daily  . multivitamin with minerals  1 tablet Oral Daily  . sodium chloride  1,000 mL Intravenous Q24H  . sodium chloride  1,000 mL Intravenous Q24H  . sodium chloride flush  3 mL Intravenous Q12H  . sulfamethoxazole-trimethoprim  1 tablet Oral Daily    Objective: Vital signs in last 24 hours: Temp:  [98.1 F (36.7 C)-99.1 F (37.3 C)] 98.1 F (36.7 C) (01/28 1312) Pulse Rate:  [76-106] 106 (01/28 1312) Resp:  [18-19] 18 (01/28 1312) BP: (119-136)/(70-86) 119/70 (01/28 1312) SpO2:  [100 %] 100 % (01/28 1312) Weight:  [110 lb 7.2 oz (50.1 kg)] 110 lb 7.2 oz (50.1 kg) (01/28 0515) Physical Exam  Constitutional: He is oriented to person, place,. He appears disheveld, chronically ill, malnourished. No distress.    HENT:  Mouth/Throat: Oropharynx is clear and moist. No oropharyngeal exudate. Poor dentition Cardiovascular: Normal rate, regular rhythm and normal heart sounds. Exam reveals no gallop and no friction rub.  No murmur heard.  Pulmonary/Chest: Effort normal and breath sounds normal. No respiratory distress. He has no wheezes.  Abdominal: Soft. Bowel sounds are normal. He exhibits no distension. There is no tenderness.  Lymphadenopathy:  He has no cervical adenopathy.  Neurological: He is alert and oriented to person, place, .  Skin: Skin is warm and dry. No rash noted. No erythema.     Lab Results Recent Labs    12/13/17 0407 12/14/17 0437  WBC 3.1*  --   HGB 8.2*  --   HCT 24.5*  --   NA 131* 133*  K 4.6 4.8  CL 110 111  CO2 17* 16*  BUN 21* 18  CREATININE 0.77 0.70    Microbiology: 1/25 rare yeast on gram stain 1/18 few crypto neoformans on culture Studies/Results: No results found.   Assessment/Plan: CM = continue on current regimen, ideally would like to get him closer to 28 days of treatment. Appears that there is still growth from his 1/25 collection. If he complains of headache tomorrow, would recommend to repeat LP since he had elevated OP from 1/25 LP.  aki =  resolved, appears to be tolerating antifungal. Will increase to liposomal ampho to 4mg /kg  He is currently day 18 of tx. Would like to extend it til we no longer have growth on his CSF culture  oi proph = continue with daily bactrim and weekly iv azithromycin  Malnutrition = continue with nutrition supplementation  Noncompliance = found to have hidden pills during the weekend. Counseled him and his partner the importance of taking his meds  [facility] for Infectious Diseases Cell: [phone] Pager: [phone]  12/14/2017, 7:04 PM

## 2017-12-14 NOTE — Progress Notes (Signed)
PHARMACY BRIEF NOTE:   ELECTROLYTE REPLACEMENT PER LIPOSOMAL AMPHOTERICIN B PROTOCOL  Ambisome 150 mg (dose reduced to ~3 mg/kg per ID earlier on admission d/t renal insufficency) IV q24h in combination with flucytosine 1250 mg (25 mg/kg/dose) PO q6h  for cryptococcal meningitis. ID service consulted and is actively following.  Recent Labs    12/13/17 0407 12/14/17 0437  NA 131* 133*  K 4.6 4.8  CL 110 111  CO2 17* 16*  GLUCOSE 100* 96  BUN 21* 18  CREATININE 0.77 0.70  CALCIUM 8.1* 8.7*  MG 1.7 1.8    Assessment: - Potassium = 4.8 - Magnesium increased slightly to 1.8 despite Magnesium 2g IV supplementation on 1/26 and 1/27 (goal > 2) - SCr 0.70, now WNL/stable. UOP 1.9 mL/kg/hr   Plan, per Amphotericin B Protocol: - Repeat Magnesium Sulfate 4 grams IV x 1 today  - Continue NS 1000 mL (increased from 500 mL per ID) bolus IV before and after each infusion of Ambisome  - Continue daily BMET, Mag - Monitor SCr, UOP   Hessie KnowsJustin M Alysabeth Scalia, PharmD, BCPS 12/14/2017 7:41 AM

## 2017-12-14 NOTE — Progress Notes (Deleted)
Cardiology Office Note    Date:  12/14/2017   ID:  Kenneth Rivas, DOB 21-Aug-1961, MRN [id number]  PCP:  Daiva EvesVan Dam, Lisette Grinderornelius N, MD  Cardiologist:  ***  Electrophysiologist: ***  Chief Complaint: Hospital follow up s/p    /  Months follow up  History of Present Illness:   Kenneth Rivas is a 57 y.o. male ***   Past Medical History:  Diagnosis Date  . AIDS (HCC)   . Anemia   . Anxiety   . Cellulitis of right hand 06/23/2017  . Hepatitis B   . HIV disease (HCC)   . Hypertension   . Immune deficiency disorder [facility](HCC)     Past Surgical History:  Procedure Laterality Date  . HERNIA REPAIR      Current Medications: Prior to Admission medications   Medication Sig Start Date End Date Taking? Authorizing Provider  amLODipine (NORVASC) 10 MG tablet Take 1 tablet (10 mg total) by mouth daily. Patient not taking: Reported on 09/09/2017 07/15/17   Shirley, [place], DO  feeding supplement, ENSURE ENLIVE, (ENSURE ENLIVE) LIQD Take 237 mLs by mouth 2 (two) times daily between meals. Patient not taking: Reported on 09/09/2017 07/14/17   Shirley, [place], DO  fluconazole (DIFLUCAN) 200 MG tablet Take 4 tablets (800 mg total) by mouth daily. 4 tablets qday x 14 days, then 2 tablets daily Patient not taking: Reported on 11/26/2017 09/09/17   Daiva EvesVan Dam, Lisette Grinderornelius N, MD  ibuprofen (ADVIL,MOTRIN) 200 MG tablet Take 200-600 mg by mouth every 6 (six) hours as needed.    [provider]  polyvinyl alcohol (LIQUIFILM TEARS) 1.4 % ophthalmic solution Place 1 drop into both eyes at bedtime. Patient not taking: Reported on 09/09/2017 07/14/17   Shirley, [place], DO  sulfamethoxazole-trimethoprim (BACTRIM DS,SEPTRA DS) 800-160 MG tablet Take 1 tablet by mouth daily. Patient not taking: Reported on 11/26/2017 09/09/17   Daiva EvesVan Dam, Lisette Grinderornelius N, MD    Allergies:   Patient has no known allergies.   Social History   Socioeconomic History  . Marital status: Married    Spouse name: Not on file  . Number of  children: Not on file  . Years of education: Not on file  . Highest education level: Not on file  Social Needs  . Financial resource strain: Not on file  . Food insecurity - worry: Not on file  . Food insecurity - inability: Not on file  . Transportation needs - medical: Not on file  . Transportation needs - non-medical: Not on file  Occupational History  . Not on file  Tobacco Use  . Smoking status: Current Some Day Smoker    Packs/day: 0.30    Types: Cigarettes  . Smokeless tobacco: Never Used  . Tobacco comment: pt. given quit line information  Substance and Sexual Activity  . Alcohol use: Yes    Alcohol/week: 2.0 oz    Types: 4 Standard drinks or equivalent per week    Comment: beer  . Drug use: Yes    Types: Cocaine    Comment: crack- last use 03/2012. no h/o IVDU, other illicits  . Sexual activity: Yes    Partners: Male    Comment: patient declined condoms  Other Topics Concern  . Not on file  Social History Narrative  . Not on file     Family History:  The patient's family history includes Hypertension in his mother. ***  ROS:   Please see the history of present illness.    ROS All other  systems reviewed and are negative.   PHYSICAL EXAM:   VS:  There were no vitals taken for this visit.   GEN: Well nourished, well developed, in no acute distress HEENT: normal Neck: no JVD, carotid bruits, or masses Cardiac: ***RRR; no murmurs, rubs, or gallops,no edema  Respiratory:  clear to auscultation bilaterally, normal work of breathing GI: soft, nontender, nondistended, + BS MS: no deformity or atrophy Skin: warm and dry, no rash Neuro:  Alert and Oriented x 3, Strength and sensation are intact Psych: euthymic mood, full affect  Wt Readings from Last 3 Encounters:  12/14/17 110 lb 7.2 oz (50.1 kg)  09/09/17 127 lb (57.6 kg)  07/14/17 112 lb 14.4 oz (51.2 kg)      Studies/Labs Reviewed:   EKG:  EKG is ordered today.  The ekg ordered today demonstrates  ***  Recent Labs: 11/29/2017: TSH 1.209 12/07/2017: ALT 102 12/13/2017: Hemoglobin 8.2; Platelets 181 12/14/2017: BUN 18; Creatinine, Ser 0.70; Magnesium 1.8; Potassium 4.8; Sodium 133   Lipid Panel    Component Value Date/Time   CHOL 152 09/16/2010 2022   TRIG 57 09/16/2010 2022   HDL 48 09/16/2010 2022   CHOLHDL 3.2 Ratio 09/16/2010 2022   VLDL 11 09/16/2010 2022   LDLCALC 93 09/16/2010 2022    Additional studies/ records that were reviewed today include:   Echocardiogram:  Cardiac Catheterization:     ASSESSMENT & PLAN:    1. ***    Medication Adjustments/Labs and Tests Ordered: Current medicines are reviewed at length with the patient today.  Concerns regarding medicines are outlined above.  Medication changes, Labs and Tests ordered today are listed in the Patient Instructions below. There are no Patient Instructions on file for this visit.   Lorelei Pont, [place]  12/14/2017 12:35 PM    [facility] [address] [place], [place], [place]  [address] Phone: [phone]; Fax: [phone]

## 2017-12-14 NOTE — Progress Notes (Signed)
[facility]  PROGRESS NOTE  Kenneth Rivas ZOX:[id number] DOB: 1961-11-01 DOA: 11/26/2017 PCP: Randall Hiss, MD   Brief HPI:   57 y.o. male with prior history of HIV/AIDS-last CD4 count less than 10, history of cryptococcal meningitis in 2018 (not very compliant with antifungals) admitted with generalized weakness, near syncopal event. Patient was noted to have a prolonged QTC, and was also found to have recurrent/persistent cryptococcal meningitis and admitted to the hospitalist service. See below for further details    Subjective   Patient seen and examined, denies any complaints.   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 Final fungal culture is pending. Infectious disease following. Plan to continue current treatment and repeat LP in  five days for opening pressure and repeat culture. 2. Hyponatremia-improved, today sodium is 133, Continue fluid restriction. BMP in am. 3. Acute kidney injury-resolved likely from acyclovir, improved with  IV fluids. ACE inhibitor and Lasix are currently  On hold. Bactrim was held for 2 days. 4. HIV/AIDS-ID following. ART's have been deferred in the setting of redo induction of cryptococcal meningitis. Continue Bactrim for PCP prophylaxis, he was off Zithromax for MAI prophylaxis due to prolonged QTC.He remains quite noncompliant with his medications, per nursing staff is throwing his pills under the bed on a consistent basis. Zithromax has been restarted. 5. Near syncope- secondary to dehydration/orthostatic hypotension. QTC has normalized. 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 6. Nonischemic cardiomyopathy- He remains compensated-continue with Coreg and ACE inhibitor-appreciate cardiology input.  Will need outpatient follow-up with cardiology. 7. QT  prolongation- resolved 8. Severe protein calorie malnutrition- continue supplements 9. Hypertension- continue coreg, Lisinopril, Lasix. 10. Anemia/leukopenia- stable, likely secondary to HIV.      DVT prophylaxis: Lovenox  Code Status: Full code  Family Communication: No family at bedside   Disposition Plan: Discharge after next LP on 12/11/2017, case management and social worker both requested to arrange for safe disposition.       Consultants:  Infectious disease  Procedures:  Lumbar puncture  Continuous infusions . amphotericin  B  Liposome (AMBISOME) ADULT IV Stopped (12/13/17 2100)      Antibiotics:   Anti-infectives (From admission, onward)   Start     Dose/Rate Route Frequency Ordered Stop   12/15/17 1000  azithromycin (ZITHROMAX) tablet 1,200 mg     1,200 mg Oral Weekly 12/10/17 1519     12/10/17 1000  sulfamethoxazole-trimethoprim (BACTRIM DS,SEPTRA DS) 800-160 MG per tablet 1 tablet     1 tablet Oral Daily 12/06/17 1039     12/08/17 1800  azithromycin (ZITHROMAX) tablet 1,200 mg     1,200 mg Oral  Once 12/08/17 1707 12/08/17 1844   12/06/17 1800  amphotericin B liposome (AMBISOME) 150 mg in dextrose 5 % 500 mL IVPB     3 mg/kg  49.6 kg 250 mL/hr over 120 Minutes Intravenous Every 24 hours 12/06/17 1147     12/04/17 1730  amphotericin B liposome (AMBISOME) 200 mg in dextrose 5 % 500 mL IVPB  Status:  Discontinued     200 mg 250 mL/hr over 120 Minutes Intravenous Every 24 hours 12/04/17 1350 12/06/17 1147   11/27/17 1800  flucytosine (ANCOBON) capsule 1,250 mg     25 mg/kg  47 kg Oral Every 6 hours 11/27/17 1615     11/27/17 1730  amphotericin B liposome (AMBISOME) 240 mg in dextrose 5 %  500 mL IVPB  Status:  Discontinued     5 mg/kg  47 kg 250 mL/hr over 120 Minutes Intravenous Every 24 hours 11/27/17 1615 12/04/17 1350   11/27/17 1000  sulfamethoxazole-trimethoprim (BACTRIM DS,SEPTRA DS) 800-160 MG per tablet 1 tablet  Status:  Discontinued     1  tablet Oral Daily 11/27/17 0839 12/06/17 1039       Objective   Vitals:   12/13/17 1335 12/13/17 2300 12/14/17 0515 12/14/17 1312  BP: 134/79 133/86 136/78 119/70  Pulse: 79 76 86 (!) 106  Resp: 18 19 18 18   Temp: 98.3 F (36.8 C) 98.8 F (37.1 C) 99.1 F (37.3 C) 98.1 F (36.7 C)  TempSrc: Oral Oral Oral Axillary  SpO2: 98% 100% 100% 100%  Weight:   50.1 kg (110 lb 7.2 oz)   Height:        Intake/Output Summary (Last 24 hours) at 12/14/2017 1546 Last data filed at 12/14/2017 1243 Gross per 24 hour  Intake 1420 ml  Output 1040 ml  Net 380 ml   Filed Weights   12/10/17 0419 12/11/17 0423 12/14/17 0515  Weight: 47.8 kg (105 lb 6.1 oz) 48.2 kg (106 lb 4.2 oz) 50.1 kg (110 lb 7.2 oz)     Physical Examination:  Physical Exam: Eyes: No icterus, extraocular muscles intact  Mouth: Oral mucosa is moist, no lesions on palate,  Neck: Supple, no deformities, masses, or tenderness Lungs: Normal respiratory effort, bilateral clear to auscultation, no crackles or wheezes.  Heart: Regular rate and rhythm, S1 and S2 normal, no murmurs, rubs auscultated Abdomen: BS normoactive,soft,nondistended,non-tender to palpation,no organomegaly Extremities: No pretibial edema, no erythema, no cyanosis, no clubbing Neuro : Alert and oriented to time, place and person, No focal deficits    Data Reviewed: I have personally reviewed following labs and imaging studies  CBG: No results for input(s): GLUCAP in the last 168 hours.  CBC: Recent Labs  Lab 12/09/17 0423 12/13/17 0407  WBC 3.2* 3.1*  NEUTROABS 2.2  --   HGB 8.3* 8.2*  HCT 24.2* 24.5*  MCV 80.9 81.1  PLT 192 181    Basic Metabolic Panel: Recent Labs  Lab 12/10/17 0413 12/11/17 0418 12/12/17 0506 12/13/17 0407 12/14/17 0437  NA 131* 131* 123* 131* 133*  K 4.0 4.2 4.0 4.6 4.8  CL 105 106 100* 110 111  CO2 21* 20* 17* 17* 16*  GLUCOSE 94 97 95 100* 96  BUN 17 16 17  21* 18  CREATININE 0.69 0.79 0.77 0.77 0.70   CALCIUM 8.3* 8.3* 7.7* 8.1* 8.7*  MG 1.8 1.7 1.9 1.7 1.8    Recent Results (from the past 240 hour(s))  CSF culture     Status: None (Preliminary result)   Collection Time: 12/11/17  2:20 PM  Result Value Ref Range Status   Specimen Description CSF  Final   Special Requests NONE  Final   Gram Stain   Final    NO ORGANISMS SEEN MODERATE WBC PRESENT, PREDOMINANTLY MONONUCLEAR Gram Stain Report Called to,Read Back By and Verified With: S.BLACKWELL RN AT 1540 ON 12/11/17 BY S.VANHOORNE    Culture   Final    RARE UNIDENTIFIED ORGANISM Results Called to: KATIE D., RN (WL/4W) AT 1530 ON 12/14/17 BY C. JESSUP, MLT CONCERNING CULTURE GROWTH. CULTURE REINCUBATED FOR BETTER GROWTH Performed at [facility], [address] [place], [place] [address]    Report Status PENDING  Incomplete  Culture, fungus without smear     Status: None (Preliminary  result)   Collection Time: 12/11/17  2:20 PM  Result Value Ref Range Status   Specimen Description CSF  Final   Special Requests NONE  Final   Culture   Final    NO FUNGUS ISOLATED AFTER 2 DAYS Performed at [facility], [address], [place], [place] [address]    Report Status PENDING  Incomplete     Liver Function Tests: No results for input(s): AST, ALT, ALKPHOS, BILITOT, PROT, ALBUMIN in the last 168 hours. No results for input(s): LIPASE, AMYLASE in the last 168 hours. No results for input(s): AMMONIA in the last 168 hours.  Cardiac Enzymes: No results for input(s): CKTOTAL, CKMB, CKMBINDEX, TROPONINI in the last 168 hours. BNP (last 3 results) No results for input(s): BNP in the last 8760 hours.  ProBNP (last 3 results) No results for input(s): PROBNP in the last 8760 hours.    Studies: No results found.  Scheduled Meds: . [START ON 12/15/2017] azithromycin  1,200 mg Oral Weekly  . carvedilol  3.125 mg Oral BID WC  . dextrose  10 mL Intravenous Q24H  . dextrose  10 mL Intravenous Q24H  . docusate  25 mg Both  EARS BID  . enoxaparin (LOVENOX) injection  40 mg Subcutaneous Q24H  . feeding supplement (ENSURE ENLIVE)  237 mL Oral TID BM  . feeding supplement (PRO-STAT SUGAR FREE 64)  30 mL Oral TID WC  . flucytosine  25 mg/kg Oral Q6H  . megestrol  40 mg Oral Daily  . multivitamin with minerals  1 tablet Oral Daily  . sodium chloride  1,000 mL Intravenous Q24H  . sodium chloride  1,000 mL Intravenous Q24H  . sodium chloride flush  3 mL Intravenous Q12H  . sulfamethoxazole-trimethoprim  1 tablet Oral Daily      Time spent: 25 min  Meredeth Ide   [facility] Pager [phone]. If 7PM-7AM, please contact night-coverage at [email or web address], Office  [phone]  password [email or web address]  12/14/2017, 3:46 PM  LOS: 17 days

## 2017-12-14 NOTE — Progress Notes (Signed)
Physical Therapy Treatment Patient Details Name: Kenneth Rivas MRN: [id number] DOB: 1961/03/17 Today's Date: 12/14/2017    History of Present Illness 57 yo male admitted with weakness, near syncopal episode.  Dx:  cryptococcal meningoencephalitis.  Hx of HIV/AIDS, Hep B, drug abuse, noncompliance, cachexia, anemia.     PT Comments    Patient progressing with gait endurance and stability, though still weak and high fall risk and needing encouragement to progress with distance.  Feel currently remains appropriate for SNF level rehab, (though friend reports pt prefers to go home.)  If pt dc home would need HHPT, aide and wheelchair.    Follow Up Recommendations  SNF     Equipment Recommendations  Wheelchair cushion (measurements PT);Wheelchair (measurements PT)(if d/c home would need wheelchair)    Recommendations for Other Services       Precautions / Restrictions Precautions Precautions: Fall Precaution Comments: unsteady gait    Mobility  Bed Mobility Overal bed mobility: Needs Assistance Bed Mobility: Supine to Sit     Supine to sit: Supervision;HOB elevated Sit to supine: Supervision;HOB elevated      Transfers Overall transfer level: Needs assistance Equipment used: Rolling walker (2 wheeled) Transfers: Sit to/from Stand Sit to Stand: Min assist         General transfer comment: cues for hand placement for safety, assist for balance up from EOB and back to EOB  Ambulation/Gait Ambulation/Gait assistance: Min assist Ambulation Distance (Feet): 60 Feet(x 2 & 30') Assistive device: Rolling walker (2 wheeled) Gait Pattern/deviations: Step-through pattern;Decreased stride length;Trunk flexed;Shuffle     General Gait Details: assist for balance/safety, no LOB with walker this session, but self limiting, HR max 125 with ambulation, returns to 90's with seated rest x 2 min   Stairs            Wheelchair Mobility    Modified Rankin (Stroke Patients  Only)       Balance Overall balance assessment: Needs assistance Sitting-balance support: Bilateral upper extremity supported;Feet supported Sitting balance-Leahy Scale: Good     Standing balance support: Bilateral upper extremity supported Standing balance-Leahy Scale: Poor Standing balance comment: UE support for balance                            Cognition Arousal/Alertness: Awake/alert Behavior During Therapy: WFL for tasks assessed/performed Overall Cognitive Status: Impaired/Different from baseline Area of Impairment: Safety/judgement;Memory;Problem solving                     Memory: Decreased short-term memory   Safety/Judgement: Decreased awareness of safety;Decreased awareness of deficits   Problem Solving: Requires verbal cues General Comments: impaired judgement despite education and encouragement limits participation; spilled coffee on bed linen and blamed his friend      Exercises      General Comments General comments (skin integrity, edema, etc.): friend in the room (calls him "brother", states they have known each other for 12 years); states pt does not want to go to rehab, wants to go home, states he will care for pt at home      Pertinent Vitals/Pain Pain Assessment: No/denies pain    Home Living                      Prior Function            PT Goals (current goals can now be found in the care plan section) Progress towards PT goals: Progressing  toward goals    Frequency    Min 3X/week      PT Plan Current plan remains appropriate    Co-evaluation              AM-PAC PT "6 Clicks" Daily Activity  Outcome Measure  Difficulty turning over in bed (including adjusting bedclothes, sheets and blankets)?: A Little Difficulty moving from lying on back to sitting on the side of the bed? : A Little Difficulty sitting down on and standing up from a chair with arms (e.g., wheelchair, bedside commode, etc,.)?:  Unable Help needed moving to and from a bed to chair (including a wheelchair)?: A Little Help needed walking in hospital room?: A Little Help needed climbing 3-5 steps with a railing? : Total 6 Click Score: 14    End of Session Equipment Utilized During Treatment: Gait belt Activity Tolerance: Patient limited by fatigue Patient left: in bed;with call bell/phone within reach;with bed alarm set;with family/visitor present   PT Visit Diagnosis: Muscle weakness (generalized) (M62.81);Difficulty in walking, not elsewhere classified (R26.2)     Time: [id number] PT Time Calculation (min) (ACUTE ONLY): 22 min  Charges:  $Gait Training: 8-22 mins                    G CodesSheran Lawless:       Cyndi Wynn, [place] [phone] 12/14/2017    Elray Mcgregorynthia Wynn 12/14/2017, 2:22 PM

## 2017-12-15 ENCOUNTER — Ambulatory Visit: Payer: Self-pay | Admitting: Cardiology

## 2017-12-15 LAB — BASIC METABOLIC PANEL
Anion gap: 4 — ABNORMAL LOW (ref 5–15)
BUN: 16 mg/dL (ref 6–20)
CALCIUM: 8.7 mg/dL — AB (ref 8.9–10.3)
CHLORIDE: 107 mmol/L (ref 101–111)
CO2: 20 mmol/L — AB (ref 22–32)
CREATININE: 0.76 mg/dL (ref 0.61–1.24)
GFR calc non Af Amer: >60 mL/min (ref >60)
Glucose, Bld: 90 mg/dL (ref 65–99)
Potassium: 4.9 mmol/L (ref 3.5–5.1)
SODIUM: 131 mmol/L — AB (ref 135–145)

## 2017-12-15 LAB — CSF CULTURE W GRAM STAIN: Gram Stain: NONE SEEN

## 2017-12-15 LAB — CSF CULTURE

## 2017-12-15 LAB — MAGNESIUM: Magnesium: 1.9 mg/dL (ref 1.7–2.4)

## 2017-12-15 MED ORDER — MAGNESIUM SULFATE 2 GM/50ML IV SOLN
2.0000 g | Freq: Once | INTRAVENOUS | Status: AC
  Administered 2017-12-15: 2 g via INTRAVENOUS
  Filled 2017-12-15: qty 50

## 2017-12-15 NOTE — Progress Notes (Signed)
Dr. Sharl MaLama paged and made aware of critical lab results of + crytococcus neoformans in CSF.

## 2017-12-15 NOTE — Progress Notes (Signed)
[facility]  PROGRESS NOTE  Kenneth Rivas WGN:[id number] DOB: 07-03-1961 DOA: 11/26/2017 PCP: Randall HissVan Dam, Cornelius N, MD   Brief HPI:   57 y.o. male with prior history of HIV/AIDS-last CD4 count less than 10, history of cryptococcal meningitis in 2018 (not very compliant with antifungals) admitted with generalized weakness, near syncopal event. Patient was noted to have a prolonged QTC, and was also found to have recurrent/persistent cryptococcal meningitis and admitted to the hospitalist service. See below for further details    Subjective   Patient seen and examined, denies headache.   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 Final fungal culture is pending. Infectious disease following. Plan to continue  repeat LP for repeat culture if he gets headache. Denies headache today. 2. Hyponatremia-improved, today sodium is 131, Continue fluid restriction. BMP in am. 3. Acute kidney injury-resolved likely from acyclovir, improved with  IV fluids. ACE inhibitor and Lasix are currently  On hold. Bactrim was held for 2 days. 4. HIV/AIDS-ID following. ART's have been deferred in the setting of redo induction of cryptococcal meningitis. Continue Bactrim for PCP prophylaxis, he was off Zithromax for MAI prophylaxis due to prolonged QTC.He remains quite noncompliant with his medications, per nursing staff is throwing his pills under the bed on a consistent basis. Zithromax has been restarted. 5. Near syncope- secondary to dehydration/orthostatic hypotension. QTC has normalized. 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 6. Nonischemic cardiomyopathy- He remains compensated-continue with Coreg and ACE inhibitor-appreciate cardiology input.  Will need outpatient follow-up with cardiology. 7. QT prolongation-  resolved 8. Severe protein calorie malnutrition- continue supplements 9. Hypertension- continue coreg, Lisinopril, Lasix. 10. Anemia/leukopenia- stable, likely secondary to HIV.      DVT prophylaxis: Lovenox  Code Status: Full code  Family Communication: No family at bedside   Disposition Plan: Discharge, once repeat LP has negative culture      Consultants:  Infectious disease  Procedures:  Lumbar puncture  Continuous infusions . amphotericin  B  Liposome (AMBISOME) ADULT IV Stopped (12/14/17 2051)      Antibiotics:   Anti-infectives (From admission, onward)   Start     Dose/Rate Route Frequency Ordered Stop   12/15/17 1000  azithromycin (ZITHROMAX) tablet 1,200 mg     1,200 mg Oral Weekly 12/10/17 1519     12/10/17 1000  sulfamethoxazole-trimethoprim (BACTRIM DS,SEPTRA DS) 800-160 MG per tablet 1 tablet     1 tablet Oral Daily 12/06/17 1039     12/08/17 1800  azithromycin (ZITHROMAX) tablet 1,200 mg     1,200 mg Oral  Once 12/08/17 1707 12/08/17 1844   12/06/17 1800  amphotericin B liposome (AMBISOME) 150 mg in dextrose 5 % 500 mL IVPB     3 mg/kg  49.6 kg 250 mL/hr over 120 Minutes Intravenous Every 24 hours 12/06/17 1147     12/04/17 1730  amphotericin B liposome (AMBISOME) 200 mg in dextrose 5 % 500 mL IVPB  Status:  Discontinued     200 mg 250 mL/hr over 120 Minutes Intravenous Every 24 hours 12/04/17 1350 12/06/17 1147   11/27/17 1800  flucytosine (ANCOBON) capsule 1,250 mg     25 mg/kg  47 kg Oral Every 6 hours 11/27/17 1615     11/27/17 1730  amphotericin B liposome (AMBISOME) 240 mg in dextrose 5 % 500 mL IVPB  Status:  Discontinued     5 mg/kg  47  kg 250 mL/hr over 120 Minutes Intravenous Every 24 hours 11/27/17 1615 12/04/17 1350   11/27/17 1000  sulfamethoxazole-trimethoprim (BACTRIM DS,SEPTRA DS) 800-160 MG per tablet 1 tablet  Status:  Discontinued     1 tablet Oral Daily 11/27/17 0839 12/06/17 1039       Objective   Vitals:    12/14/17 1312 12/14/17 2042 12/15/17 0401 12/15/17 0949  BP: 119/70 (!) 152/83 (!) 164/98 (!) 139/92  Pulse: (!) 106 85 67 92  Resp: 18 18 19 18   Temp: 98.1 F (36.7 C) 99 F (37.2 C) 98 F (36.7 C) 98.7 F (37.1 C)  TempSrc: Axillary Oral Oral Oral  SpO2: 100% 100% 100% 100%  Weight:   50 kg (110 lb 3.7 oz)   Height:        Intake/Output Summary (Last 24 hours) at 12/15/2017 1123 Last data filed at 12/15/2017 0953 Gross per 24 hour  Intake 1820 ml  Output 2275 ml  Net -455 ml   Filed Weights   12/11/17 0423 12/14/17 0515 12/15/17 0401  Weight: 48.2 kg (106 lb 4.2 oz) 50.1 kg (110 lb 7.2 oz) 50 kg (110 lb 3.7 oz)     Physical Examination:  Physical Exam: Eyes: No icterus, extraocular muscles intact  Mouth: Oral mucosa is moist, no lesions on palate,  Neck: Supple, no deformities, masses, or tenderness Lungs: Normal respiratory effort, bilateral clear to auscultation, no crackles or wheezes.  Heart: Regular rate and rhythm, S1 and S2 normal, no murmurs, rubs auscultated Abdomen: BS normoactive,soft,nondistended,non-tender to palpation,no organomegaly Extremities: No pretibial edema, no erythema, no cyanosis, no clubbing Neuro : Alert and oriented to time, place and person, No focal deficits     Data Reviewed: I have personally reviewed following labs and imaging studies  CBG: No results for input(s): GLUCAP in the last 168 hours.  CBC: Recent Labs  Lab 12/09/17 0423 12/13/17 0407  WBC 3.2* 3.1*  NEUTROABS 2.2  --   HGB 8.3* 8.2*  HCT 24.2* 24.5*  MCV 80.9 81.1  PLT 192 181    Basic Metabolic Panel: Recent Labs  Lab 12/11/17 0418 12/12/17 0506 12/13/17 0407 12/14/17 0437 12/15/17 0458  NA 131* 123* 131* 133* 131*  K 4.2 4.0 4.6 4.8 4.9  CL 106 100* 110 111 107  CO2 20* 17* 17* 16* 20*  GLUCOSE 97 95 100* 96 90  BUN 16 17 21* 18 16  CREATININE 0.79 0.77 0.77 0.70 0.76  CALCIUM 8.3* 7.7* 8.1* 8.7* 8.7*  MG 1.7 1.9 1.7 1.8 1.9    Recent Results  (from the past 240 hour(s))  CSF culture     Status: None (Preliminary result)   Collection Time: 12/11/17  2:20 PM  Result Value Ref Range Status   Specimen Description CSF  Final   Special Requests NONE  Final   Gram Stain   Final    NO ORGANISMS SEEN MODERATE WBC PRESENT, PREDOMINANTLY MONONUCLEAR Gram Stain Report Called to,Read Back By and Verified With: S.BLACKWELL RN AT 1540 ON 12/11/17 BY S.VANHOORNE    Culture   Final    RARE UNIDENTIFIED ORGANISM Results Called to: KATIE D., RN (WL/4W) AT 1530 ON 12/14/17 BY C. JESSUP, MLT CONCERNING CULTURE GROWTH. IDENTIFICATION TO FOLLOW Performed at [facility], [address] [place], [place] [address]    Report Status PENDING  Incomplete  Culture, fungus without smear     Status: None (Preliminary result)   Collection Time: 12/11/17  2:20 PM  Result Value Ref  Range Status   Specimen Description CSF  Final   Special Requests NONE  Final   Culture   Final    NO FUNGUS ISOLATED AFTER 2 DAYS Performed at [facility], [address] [place], [place] [address]    Report Status PENDING  Incomplete     Liver Function Tests: No results for input(s): AST, ALT, ALKPHOS, BILITOT, PROT, ALBUMIN in the last 168 hours. No results for input(s): LIPASE, AMYLASE in the last 168 hours. No results for input(s): AMMONIA in the last 168 hours.  Cardiac Enzymes: No results for input(s): CKTOTAL, CKMB, CKMBINDEX, TROPONINI in the last 168 hours. BNP (last 3 results) No results for input(s): BNP in the last 8760 hours.  ProBNP (last 3 results) No results for input(s): PROBNP in the last 8760 hours.    Studies: No results found.  Scheduled Meds: . azithromycin  1,200 mg Oral Weekly  . carvedilol  3.125 mg Oral BID WC  . dextrose  10 mL Intravenous Q24H  . dextrose  10 mL Intravenous Q24H  . docusate  25 mg Both EARS BID  . enoxaparin (LOVENOX) injection  40 mg Subcutaneous Q24H  . feeding supplement (ENSURE ENLIVE)  237 mL  Oral TID BM  . feeding supplement (PRO-STAT SUGAR FREE 64)  30 mL Oral TID WC  . flucytosine  25 mg/kg Oral Q6H  . megestrol  40 mg Oral Daily  . multivitamin with minerals  1 tablet Oral Daily  . sodium chloride  1,000 mL Intravenous Q24H  . sodium chloride  1,000 mL Intravenous Q24H  . sodium chloride flush  3 mL Intravenous Q12H  . sulfamethoxazole-trimethoprim  1 tablet Oral Daily      Time spent: 25 min  Meredeth Ide   [facility] Pager [phone]. If 7PM-7AM, please contact night-coverage at [email or web address], Office  [phone]  password [email or web address]  12/15/2017, 11:23 AM  LOS: 18 days

## 2017-12-16 ENCOUNTER — Encounter: Payer: Self-pay | Admitting: Cardiology

## 2017-12-16 LAB — BASIC METABOLIC PANEL
ANION GAP: 5 (ref 5–15)
BUN: 16 mg/dL (ref 6–20)
CALCIUM: 8.4 mg/dL — AB (ref 8.9–10.3)
CO2: 16 mmol/L — ABNORMAL LOW (ref 22–32)
CREATININE: 0.77 mg/dL (ref 0.61–1.24)
Chloride: 108 mmol/L (ref 101–111)
GLUCOSE: 89 mg/dL (ref 65–99)
Potassium: 5.6 mmol/L — ABNORMAL HIGH (ref 3.5–5.1)
Sodium: 129 mmol/L — ABNORMAL LOW (ref 135–145)

## 2017-12-16 LAB — CBC
HCT: 25.8 % — ABNORMAL LOW (ref 39.0–52.0)
HEMOGLOBIN: 9.1 g/dL — AB (ref 13.0–17.0)
MCH: 27.9 pg (ref 26.0–34.0)
MCHC: 35.3 g/dL (ref 30.0–36.0)
MCV: 79.1 fL (ref 78.0–100.0)
PLATELETS: 169 10*3/uL (ref 150–400)
RBC: 3.26 MIL/uL — AB (ref 4.22–5.81)
RDW: 14.9 % (ref 11.5–15.5)
WBC: 3 10*3/uL — ABNORMAL LOW (ref 4.0–10.5)

## 2017-12-16 LAB — MAGNESIUM: MAGNESIUM: 2 mg/dL (ref 1.7–2.4)

## 2017-12-16 MED ORDER — DEXTROSE 5 % IV SOLN
4.0000 mg/kg | INTRAVENOUS | Status: DC
  Administered 2017-12-16 – 2017-12-21 (×6): 200 mg via INTRAVENOUS
  Filled 2017-12-16 (×7): qty 200

## 2017-12-16 NOTE — Progress Notes (Addendum)
PROGRESS NOTE    Kenneth Rivas  UJW:[id number] DOB: 1960-11-28 DOA: 11/26/2017 PCP: Randall Hiss, MD   Brief Narrative:  57 y.o.male with prior history of HIV/AIDS-last CD4 count less than 10, history of cryptococcal meningitis in 2018 (not very compliant with antifungals) admitted with generalized weakness, near syncopal event. Patient was noted to have a prolonged QTC, and was also found to have recurrent/persistent cryptococcal meningitis and admitted to the hospitalist service. See below for further details  Assessment & Plan:   Principal Problem:   Cryptococcal meningoencephalitis (HCC) Active Problems:   HTN (hypertension)   Cachexia associated with AIDS (HCC)   Hypokalemia   Hyponatremia   Protein-calorie malnutrition, severe   AIDS (acquired immune deficiency syndrome) (HCC)   Prolonged QT interval   Near syncope   Cryptococcal meningitis (HCC)   Anemia   Abnormal echocardiogram   Cardiomyopathy [facility]): Per 2 d echo 11/28/2017   1. 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  Plan to extend therapy until no growth on CSF cx. 1. S/p Lumbar puncture on 12/04/2017 and 1/25 with cryptococcus 2. Appreciate ID recs, recommending repeat LP.  3.  Infectious disease following. Denies headache today.  2. Hyponatremia- worse today, continue to monitor for now.  Getting IVF boluses prior to and after ampho.  3. Acute kidney injury- Improved.  Suspected related to acyclovir, improved with  IV fluids. ACE inhibitor and Lasix are currently  On hold. Bactrim was held for 2 days.  4. HIV/AIDS- ID following. ART's have been deferred in the setting of redo induction of cryptococcal meningitis. Continue Bactrim for PCP prophylaxis.  Resume azithromycin for MAI prophylaxis. Concern for noncompliance with meds being found under bed.  5. Near syncope- secondary to dehydration/orthostatic hypotension.  QTC has normalized. 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   6. Nonischemic cardiomyopathy- He remains compensated-continue with Coreg and ACE inhibitor-appreciate cardiology input. Will need outpatient follow-up with cardiology.  7. QT prolongation- 500's on 1/10, improved to 460's on 1/13  8. Severe protein calorie malnutrition- continue supplements  9. Hypertension- continue coreg, Lisinopril, Lasix.  10. Anemia/leukopenia- stable, likely secondary to HIV.  11. Hyperkalemia: mild, follow repeat BMP in AM  DVT prophylaxis: lovenox Code Status: full code Family Communication:none at bedside Disposition Plan: pending  Consultants:   ID  Procedures:   LP on 1/18 and 1/25  Antimicrobials:  Anti-infectives (From admission, onward)   Start     Dose/Rate Route Frequency Ordered Stop   12/16/17 1800  amphotericin B liposome (AMBISOME) 200 mg in dextrose 5 % 500 mL IVPB     4 mg/kg  49.6 kg 250 mL/hr over 120 Minutes Intravenous Every 24 hours 12/16/17 1417     12/15/17 1000  azithromycin (ZITHROMAX) tablet 1,200 mg     1,200 mg Oral Weekly 12/10/17 1519     12/10/17 1000  sulfamethoxazole-trimethoprim (BACTRIM DS,SEPTRA DS) 800-160 MG per tablet 1 tablet     1 tablet Oral Daily 12/06/17 1039     12/08/17 1800  azithromycin (ZITHROMAX) tablet 1,200 mg     1,200 mg Oral  Once 12/08/17 1707 12/08/17 1844   12/06/17 1800  amphotericin B liposome (AMBISOME) 150 mg in dextrose 5 % 500 mL IVPB  Status:  Discontinued     3 mg/kg  49.6 kg 250 mL/hr over 120 Minutes Intravenous Every 24 hours 12/06/17 1147 12/16/17 1418   12/04/17 1730  amphotericin B liposome (AMBISOME)  200 mg in dextrose 5 % 500 mL IVPB  Status:  Discontinued     200 mg 250 mL/hr over 120 Minutes Intravenous Every 24 hours 12/04/17 1350 12/06/17 1147   11/27/17 1800  flucytosine (ANCOBON) capsule 1,250 mg     25 mg/kg  47 kg Oral Every 6 hours 11/27/17 1615     11/27/17 1730   amphotericin B liposome (AMBISOME) 240 mg in dextrose 5 % 500 mL IVPB  Status:  Discontinued     5 mg/kg  47 kg 250 mL/hr over 120 Minutes Intravenous Every 24 hours 11/27/17 1615 12/04/17 1350   11/27/17 1000  sulfamethoxazole-trimethoprim (BACTRIM DS,SEPTRA DS) 800-160 MG per tablet 1 tablet  Status:  Discontinued     1 tablet Oral Daily 11/27/17 0839 12/06/17 1039       Subjective: Denies HA. Asking when he can go home.  Objective: Vitals:   12/15/17 2119 12/16/17 0425 12/16/17 0426 12/16/17 1340  BP: 131/82 (!) 146/81  121/87  Pulse: 82 90  86  Resp: 18 18  18   Temp: 99.1 F (37.3 C) 99 F (37.2 C)  98.8 F (37.1 C)  TempSrc: Oral Oral  Oral  SpO2: 100% 100%  100%  Weight:   50.1 kg (110 lb 7.2 oz)   Height:        Intake/Output Summary (Last 24 hours) at 12/16/2017 1924 Last data filed at 12/16/2017 1800 Gross per 24 hour  Intake 23866.67 ml  Output 3201 ml  Net 20665.67 ml   Filed Weights   12/14/17 0515 12/15/17 0401 12/16/17 0426  Weight: 50.1 kg (110 lb 7.2 oz) 50 kg (110 lb 3.7 oz) 50.1 kg (110 lb 7.2 oz)    Examination:  General exam: Appears calm and comfortable, cachetic, eating lunch Respiratory system: Clear to auscultation. Respiratory effort normal. Cardiovascular system: S1 & S2 heard, RRR. No JVD, murmurs, rubs, gallops or clicks. No pedal edema. Gastrointestinal system: Abdomen is nondistended, soft and nontender. No organomegaly or masses felt. Normal bowel sounds heard. Central nervous system: Alert and oriented. No focal neurological deficits. Extremities: Symmetric 5 x 5 power. Skin: No rashes, lesions or ulcers Psychiatry: Judgement and insight appear normal. Mood & affect appropriate.     Data Reviewed: I have personally reviewed following labs and imaging studies  CBC: Recent Labs  Lab 12/13/17 0407 12/16/17 0513  WBC 3.1* 3.0*  HGB 8.2* 9.1*  HCT 24.5* 25.8*  MCV 81.1 79.1  PLT 181 169   Basic Metabolic Panel: Recent Labs    Lab 12/12/17 0506 12/13/17 0407 12/14/17 0437 12/15/17 0458 12/16/17 0513  NA 123* 131* 133* 131* 129*  K 4.0 4.6 4.8 4.9 5.6*  CL 100* 110 111 107 108  CO2 17* 17* 16* 20* 16*  GLUCOSE 95 100* 96 90 89  BUN 17 21* 18 16 16   CREATININE 0.77 0.77 0.70 0.76 0.77  CALCIUM 7.7* 8.1* 8.7* 8.7* 8.4*  MG 1.9 1.7 1.8 1.9 2.0   GFR: Estimated Creatinine Clearance: 71.3 mL/min (by C-G formula based on SCr of 0.77 mg/dL). Liver Function Tests: No results for input(s): AST, ALT, ALKPHOS, BILITOT, PROT, ALBUMIN in the last 168 hours. No results for input(s): LIPASE, AMYLASE in the last 168 hours. No results for input(s): AMMONIA in the last 168 hours. Coagulation Profile: No results for input(s): INR, PROTIME in the last 168 hours. Cardiac Enzymes: No results for input(s): CKTOTAL, CKMB, CKMBINDEX, TROPONINI in the last 168 hours. BNP (last 3 results) No results for input(s):  PROBNP in the last 8760 hours. HbA1C: No results for input(s): HGBA1C in the last 72 hours. CBG: No results for input(s): GLUCAP in the last 168 hours. Lipid Profile: No results for input(s): CHOL, HDL, LDLCALC, TRIG, CHOLHDL, LDLDIRECT in the last 72 hours. Thyroid Function Tests: No results for input(s): TSH, T4TOTAL, FREET4, T3FREE, THYROIDAB in the last 72 hours. Anemia Panel: No results for input(s): VITAMINB12, FOLATE, FERRITIN, TIBC, IRON, RETICCTPCT in the last 72 hours. Sepsis Labs: No results for input(s): PROCALCITON, LATICACIDVEN in the last 168 hours.  Recent Results (from the past 240 hour(s))  CSF culture     Status: None   Collection Time: 12/11/17  2:20 PM  Result Value Ref Range Status   Specimen Description CSF  Final   Special Requests NONE  Final   Gram Stain   Final    NO ORGANISMS SEEN MODERATE WBC PRESENT, PREDOMINANTLY MONONUCLEAR Gram Stain Report Called to,Read Back By and Verified With: S.BLACKWELL RN AT 1540 ON 12/11/17 BY S.VANHOORNE    Culture   Final    RARE CRYPTOCOCCUS  NEOFORMANS Results Called to: KATIE D., RN (WL/4W) AT 1530 ON 12/14/17 BY C. JESSUP, MLT CONCERNING CULTURE GROWTH. CRITICAL RESULT CALLED TO, READ BACK BY AND VERIFIED WITH: K. OBERRY, RN (WL/4W) AT 1525 ON 12/15/17 BY C. JESSUP, MLT. Performed at [facility], [address] [place], [place] [address]    Report Status 12/15/2017 FINAL  Final  Culture, fungus without smear     Status: None (Preliminary result)   Collection Time: 12/11/17  2:20 PM  Result Value Ref Range Status   Specimen Description CSF  Final   Special Requests NONE  Final   Culture   Final    NO FUNGUS ISOLATED AFTER 2 DAYS Performed at [facility], [address] [place], [place] [address]    Report Status PENDING  Incomplete  Fungus Culture With Stain     Status: None (Preliminary result)   Collection Time: 12/11/17  2:20 PM  Result Value Ref Range Status   Fungus Stain Final report  Final    Comment: (NOTE) Performed At: [facility] [address] [place], [place] [id number] Jolene Schimke MD HY:[id number]    Fungus (Mycology) Culture PENDING  Incomplete   Fungal Source PENDING  Incomplete  Fungus Culture Result     Status: None   Collection Time: 12/11/17  2:20 PM  Result Value Ref Range Status   Result 1 Comment  Final    Comment: (NOTE) Fungal elements, such as arthroconidia, hyphal fragments, chlamydoconidia, observed. Performed At: [facility] [address] [place], [place] [id number] Jolene Schimke MD MW:[id number]          Radiology Studies: No results found.      Scheduled Meds: . azithromycin  1,200 mg Oral Weekly  . carvedilol  3.125 mg Oral BID WC  . dextrose  10 mL Intravenous Q24H  . dextrose  10 mL Intravenous Q24H  . docusate  25 mg Both EARS BID  . enoxaparin (LOVENOX) injection  40 mg Subcutaneous Q24H  . flucytosine  25 mg/kg Oral Q6H  . megestrol  40 mg Oral Daily  . multivitamin with minerals  1 tablet Oral Daily  . sodium chloride   1,000 mL Intravenous Q24H  . sodium chloride  1,000 mL Intravenous Q24H  . sodium chloride flush  3 mL Intravenous Q12H  . sulfamethoxazole-trimethoprim  1 tablet Oral Daily   Continuous Infusions: . amphotericin  B  Liposome (AMBISOME) ADULT IV 200 mg (12/16/17 1753)     LOS: 19 days    Time spent: over 30 min    Lacretia Nicksaldwell Powell, MD [facility] Pager [phone]  If 7PM-7AM, please contact night-coverage [email or web address] Password [email or web address] 12/16/2017, 7:24 PM

## 2017-12-16 NOTE — Progress Notes (Signed)
Physical Therapy Treatment Patient Details Name: Kenneth Rivas MRN: [id number] DOB: 1961-08-27 Today's Date: 12/16/2017    History of Present Illness 57 yo male admitted with weakness, near syncopal episode.  Dx:  cryptococcal meningoencephalitis.  Hx of HIV/AIDS, Hep B, drug abuse, noncompliance, cachexia, anemia.     PT Comments    Pt ambulated in hallway and requires steadying assist at this time.  Pt required a seated rest break.  Per chart review, pt declines SNF upon d/c and wishes to go home.  No friends/family present for session.  Follow Up Recommendations  SNF     Equipment Recommendations  Wheelchair cushion (measurements PT);Wheelchair (measurements PT)    Recommendations for Other Services       Precautions / Restrictions Precautions Precautions: Fall Precaution Comments: unsteady gait    Mobility  Bed Mobility Overal bed mobility: Needs Assistance Bed Mobility: Supine to Sit     Supine to sit: Supervision;HOB elevated Sit to supine: Supervision;HOB elevated   General bed mobility comments: supervision for lines  Transfers Overall transfer level: Needs assistance Equipment used: Rolling walker (2 wheeled) Transfers: Sit to/from Stand Sit to Stand: Min assist         General transfer comment: verbal cues for safe technique, assist to rise and steady, cues for not sitting while turning  Ambulation/Gait Ambulation/Gait assistance: Min assist Ambulation Distance (Feet): 100 Feet Assistive device: Rolling walker (2 wheeled) Gait Pattern/deviations: Step-through pattern;Decreased stride length;Trunk flexed;Shuffle     General Gait Details: assist for steadying, cues for RW positioning (tends to keep too far forward), unsteady gait with short shuffling steps, fatigues quickly requesting seated break after 50 feet.   Stairs            Wheelchair Mobility    Modified Rankin (Stroke Patients Only)       Balance                                             Cognition Arousal/Alertness: Awake/alert Behavior During Therapy: WFL for tasks assessed/performed Overall Cognitive Status: Impaired/Different from baseline Area of Impairment: Problem solving;Safety/judgement                         Safety/Judgement: Decreased awareness of safety;Decreased awareness of deficits     General Comments: very HOH, attempts to sit down while turning despite cues      Exercises      General Comments        Pertinent Vitals/Pain Pain Assessment: No/denies pain    Home Living                      Prior Function            PT Goals (current goals can now be found in the care plan section) Acute Rehab PT Goals PT Goal Formulation: With patient Time For Goal Achievement: 12/30/17 Potential to Achieve Goals: Fair Progress towards PT goals: Progressing toward goals    Frequency           PT Plan Current plan remains appropriate    Co-evaluation              AM-PAC PT "6 Clicks" Daily Activity  Outcome Measure  Difficulty turning over in bed (including adjusting bedclothes, sheets and blankets)?: A Little Difficulty moving from lying on back to sitting on  the side of the bed? : A Little Difficulty sitting down on and standing up from a chair with arms (e.g., wheelchair, bedside commode, etc,.)?: Unable Help needed moving to and from a bed to chair (including a wheelchair)?: A Little Help needed walking in hospital room?: A Little Help needed climbing 3-5 steps with a railing? : Total 6 Click Score: 14    End of Session Equipment Utilized During Treatment: Gait belt Activity Tolerance: Patient limited by fatigue Patient left: in bed;with call bell/phone within reach;with bed alarm set Nurse Communication: Mobility status PT Visit Diagnosis: Muscle weakness (generalized) (M62.81);Difficulty in walking, not elsewhere classified (R26.2)     Time: 1610-9604 PT Time  Calculation (min) (ACUTE ONLY): 17 min  Charges:  $Gait Training: 8-22 mins                    G Codes:      Zenovia Jarred, PT, DPT 12/16/2017 Pager: [phone]  Maida Sale E 12/16/2017, 3:42 PM

## 2017-12-16 NOTE — Progress Notes (Addendum)
[facility]    Date of Admission:  11/26/2017   Total days of antibiotics 18        Day 20 of crypto meningitis tx           ID: Jacolyn Reedylonzo Berdan is a 57 y.o. male with advanced hiv disease, repeat induction tx for CM Principal Problem:   Cryptococcal meningoencephalitis (HCC) Active Problems:   HTN (hypertension)   Cachexia associated with AIDS (HCC)   Hypokalemia   Hyponatremia   Protein-calorie malnutrition, severe   AIDS (acquired immune deficiency syndrome) (HCC)   Prolonged QT interval   Near syncope   Cryptococcal meningitis (HCC)   Anemia   Abnormal echocardiogram   Cardiomyopathy [facility](HCC): Per 2 d echo 11/28/2017    Subjective: Afebrile. Hungry this afternoon. Eating pudding. He denies headache  Medications:  . azithromycin  1,200 mg Oral Weekly  . carvedilol  3.125 mg Oral BID WC  . dextrose  10 mL Intravenous Q24H  . dextrose  10 mL Intravenous Q24H  . docusate  25 mg Both EARS BID  . enoxaparin (LOVENOX) injection  40 mg Subcutaneous Q24H  . flucytosine  25 mg/kg Oral Q6H  . megestrol  40 mg Oral Daily  . multivitamin with minerals  1 tablet Oral Daily  . sodium chloride  1,000 mL Intravenous Q24H  . sodium chloride  1,000 mL Intravenous Q24H  . sodium chloride flush  3 mL Intravenous Q12H  . sulfamethoxazole-trimethoprim  1 tablet Oral Daily    Objective: Vital signs in last 24 hours: Temp:  [98.8 F (37.1 C)-99.1 F (37.3 C)] 99 F (37.2 C) (01/30 0425) Pulse Rate:  [82-104] 90 (01/30 0425) Resp:  [18] 18 (01/30 0425) BP: (106-146)/(70-82) 146/81 (01/30 0425) SpO2:  [99 %-100 %] 100 % (01/30 0425) Weight:  [110 lb 7.2 oz (50.1 kg)] 110 lb 7.2 oz (50.1 kg) (01/30 0426)  Physical Exam  Constitutional: He is oriented to person, place. He appears disheveled, chronically ill and mal-nourished. No distress.  HENT:  Mouth/Throat: Oropharynx is clear and moist. No oropharyngeal exudate. Poor dentition Cardiovascular: Normal  rate, regular rhythm and normal heart sounds. Exam reveals no gallop and no friction rub.  No murmur heard.  Pulmonary/Chest: Effort normal and breath sounds normal. No respiratory distress. He has no wheezes.  Abdominal: Soft. Bowel sounds are normal. He exhibits no distension. There is no tenderness.  Skin: Skin is warm and dry. No rash noted. No erythema.  Psychiatric: He has a normal mood and affect. His behavior is normal.     Lab Results Recent Labs    12/15/17 0458 12/16/17 0513  WBC  --  3.0*  HGB  --  9.1*  HCT  --  25.8*  NA 131* 129*  K 4.9 5.6*  CL 107 108  CO2 20* 16*  BUN 16 16  CREATININE 0.76 0.77    Microbiology: 1/25 rare yeast on gram stain- cx+ rare CM on day 4 1/18 few crypto neoformans  Studies/Results: No results found.   Assessment/Plan: CM = continue on current regimen of L-ampho plus flucytosine. Will increase L-ampho back to 4mg /kg/d, ideally would like to get him closer to 28 days of treatment. there is still growth from his 1/25 collection. Since he had elevated opening pressure on 1/25, recommend to repeat LP today or tomorrow. Please get opening pressure and repeat aerobic/fungal cx. He denies having headache or visual disturbances for me today.  aki = resolved, appears to be  tolerating antifungal. Will increase to liposomal ampho to 4mg /kg  He is currently day 20 of tx. Would like to extend it til we no longer have growth on his CSF culture  oi proph = continue with daily bactrim and weekly iv azithromycin  Hyponatremia = he fluctuates slightly between 129-133. Continue to monitor  hiv disease= ART deferred at this time while re-induction for CM tx  Malnutrition = continue with nutrition supplementation  Noncompliance = hx of non-compliance. Again discussed importance of taking meds daily  [facility] Cell: [phone] Pager: [phone]  12/16/2017, 12:25 PM

## 2017-12-16 NOTE — Progress Notes (Signed)
Nutrition Follow-up  DOCUMENTATION CODES:   Underweight, Severe malnutrition in context of chronic illness  INTERVENTION:    Magic cup TID with meals, each supplement provides 290 kcal and 9 grams of protein  Provide MVI daily  NUTRITION DIAGNOSIS:   Severe Malnutrition related to chronic illness(HIV with non compliance) as evidenced by percent weight loss, severe fat depletion, moderate fat depletion, severe muscle depletion.  Ongoing  GOAL:   Patient will meet greater than or equal to 90% of their needs  Meeting  MONITOR:   PO intake, Supplement acceptance, Weight trends, Labs  REASON FOR ASSESSMENT:   Consult Assessment of nutrition requirement/status  ASSESSMENT:   Pt with PMH significant for HIV/AIDS with poor compliance, drug abuse, HTN, and anemia. Presents this admission with near syncopal event. Admitted for prolonged QT interval and dehydration.    Pt sleeping upon follow up. Pt continues to eat well during this admission. Meal completions charted as 90-100% at each meal. Spoke with nurse regarding supplements. Pt refuses Prostat and Ensure, but requests ice cream frequently. Will change supplement to Magic Cup TID. Pt is currently on a fluid restriction of 1200 ml/day. Weight noted to be increased by 5 lb since last RD visit 1/23. Pt remains in positive fluid balance. Plan for discharge once repeat LP has negative culture.   Medications reviewed and include: megace, ambisome, MVI with minerals Labs reviewed: Na 129 (L) K 5.6 (H) AST 57 (H) ALT 102 (H)  Diet Order:  Diet 2 gram sodium Room service appropriate? Yes; Fluid consistency: Thin; Fluid restriction: 1200 mL Fluid  EDUCATION NEEDS:   Not appropriate for education at this time  Skin:  Skin Assessment: Reviewed RN Assessment  Last BM:  12/14/17  Height:   Ht Readings from Last 1 Encounters:  11/27/17 5\' 6"  (1.676 m)    Weight:   Wt Readings from Last 1 Encounters:  12/16/17 110 lb 7.2 oz  (50.1 kg)    Ideal Body Weight:  70 kg  BMI:  Body mass index is 17.83 kg/m.  Estimated Nutritional Needs:   Kcal:  2100-2300 kcal/day  Protein:  100-110 g/day  Fluid:  >2.1 L/day    Vanessa Kickarly Anthonio Mizzell RD, LDN Clinical Nutrition Pager # - [phone]

## 2017-12-17 ENCOUNTER — Inpatient Hospital Stay (HOSPITAL_COMMUNITY): Payer: Medicaid Other

## 2017-12-17 LAB — BASIC METABOLIC PANEL
ANION GAP: 4 — AB (ref 5–15)
BUN: 18 mg/dL (ref 6–20)
CO2: 18 mmol/L — AB (ref 22–32)
Calcium: 8.7 mg/dL — ABNORMAL LOW (ref 8.9–10.3)
Chloride: 108 mmol/L (ref 101–111)
Creatinine, Ser: 0.82 mg/dL (ref 0.61–1.24)
GFR calc Af Amer: >60 mL/min (ref >60)
GLUCOSE: 100 mg/dL — AB (ref 65–99)
Potassium: 5 mmol/L (ref 3.5–5.1)
Sodium: 130 mmol/L — ABNORMAL LOW (ref 135–145)

## 2017-12-17 LAB — CSF CELL COUNT WITH DIFFERENTIAL
EOS CSF: 0 % (ref 0–1)
LYMPHS CSF: 87 % — AB (ref 40–80)
Monocyte-Macrophage-Spinal Fluid: 7 % — ABNORMAL LOW (ref 15–45)
RBC COUNT CSF: 14 /mm3 — AB
SEGMENTED NEUTROPHILS-CSF: 6 % (ref 0–6)
Tube #: 4
WBC CSF: 29 /mm3 — AB (ref 0–5)

## 2017-12-17 LAB — CRYPTOCOCCAL ANTIGEN, CSF
CRYPTO AG: POSITIVE — AB
CRYPTOCOCCAL AG TITER: 640 — AB

## 2017-12-17 LAB — CBC
HEMATOCRIT: 26.2 % — AB (ref 39.0–52.0)
Hemoglobin: 8.9 g/dL — ABNORMAL LOW (ref 13.0–17.0)
MCH: 27.2 pg (ref 26.0–34.0)
MCHC: 34 g/dL (ref 30.0–36.0)
MCV: 80.1 fL (ref 78.0–100.0)
PLATELETS: 232 10*3/uL (ref 150–400)
RBC: 3.27 MIL/uL — AB (ref 4.22–5.81)
RDW: 15.2 % (ref 11.5–15.5)
WBC: 3.6 10*3/uL — AB (ref 4.0–10.5)

## 2017-12-17 LAB — PROTEIN, CSF: Total  Protein, CSF: 55 mg/dL — ABNORMAL HIGH (ref 15–45)

## 2017-12-17 LAB — MAGNESIUM: Magnesium: 1.7 mg/dL (ref 1.7–2.4)

## 2017-12-17 MED ORDER — MAGNESIUM SULFATE 2 GM/50ML IV SOLN
2.0000 g | Freq: Once | INTRAVENOUS | Status: AC
  Administered 2017-12-17: 2 g via INTRAVENOUS
  Filled 2017-12-17: qty 50

## 2017-12-17 MED ORDER — LIDOCAINE HCL 1 % IJ SOLN
INTRAMUSCULAR | Status: AC
  Administered 2017-12-17: 3 mL via INTRADERMAL
  Filled 2017-12-17: qty 20

## 2017-12-17 NOTE — Progress Notes (Signed)
OT Cancellation Note  Patient Details Name: Kenneth Rivas MRN: [id number] DOB: 1961/10/09   Cancelled Treatment:    Reason Eval/Treat Not Completed: Other (comment)  Spoke to RN. Pt is NPO; getting LP and not in a good mood right now. Will check back tomorrow if schedule permits.  Henritta Mutz 12/17/2017, 2:03 PM  Marica OtterMaryellen Cariana Karge, OTR/L [phone] 12/17/2017

## 2017-12-17 NOTE — Progress Notes (Signed)
[facility]    Date of Admission:  11/26/2017   Total days of antibiotics 21        Day L-ampho/flucytosine   ID: Kenneth Rivas is a 57 y.o. male with advanced hiv disease and CM Principal Problem:   Cryptococcal meningoencephalitis (HCC) Active Problems:   HTN (hypertension)   Cachexia associated with AIDS (HCC)   Hypokalemia   Hyponatremia   Protein-calorie malnutrition, severe   AIDS (acquired immune deficiency syndrome) (HCC)   Prolonged QT interval   Near syncope   Cryptococcal meningitis (HCC)   Anemia   Abnormal echocardiogram   Cardiomyopathy [facility]): Per 2 d echo 11/28/2017    Subjective: Afebrile. Denies headache. Just underwent LP OP of 17. No difficulties. Denies back pain from LP nor any HA. " I would like to ice cream"  Medications:  . azithromycin  1,200 mg Oral Weekly  . carvedilol  3.125 mg Oral BID WC  . dextrose  10 mL Intravenous Q24H  . dextrose  10 mL Intravenous Q24H  . docusate  25 mg Both EARS BID  . enoxaparin (LOVENOX) injection  40 mg Subcutaneous Q24H  . flucytosine  25 mg/kg Oral Q6H  . megestrol  40 mg Oral Daily  . multivitamin with minerals  1 tablet Oral Daily  . sodium chloride  1,000 mL Intravenous Q24H  . sodium chloride  1,000 mL Intravenous Q24H  . sodium chloride flush  3 mL Intravenous Q12H  . sulfamethoxazole-trimethoprim  1 tablet Oral Daily    Objective: Vital signs in last 24 hours: Temp:  [98.2 F (36.8 C)-99.5 F (37.5 C)] 99 F (37.2 C) (01/31 1006) Pulse Rate:  [89-102] 96 (01/31 1607) Resp:  [17-18] 18 (01/31 1006) BP: (110-135)/(71-86) 135/72 (01/31 1607) SpO2:  [100 %] 100 % (01/31 1006) Weight:  [110 lb 0.2 oz (49.9 kg)] 110 lb 0.2 oz (49.9 kg) (01/31 0443) Physical Exam  Constitutional: He is oriented to person, place. He appears disheveled, chronically ill, malnourished. No distress.  HENT:  Mouth/Throat: Oropharynx is clear and moist. No oropharyngeal exudate.  Cardiovascular:  Normal rate, regular rhythm and normal heart sounds. Exam reveals no gallop and no friction rub.  No murmur heard.  Pulmonary/Chest: Effort normal and breath sounds normal. No respiratory distress. He has no wheezes.  Abdominal: Soft. Bowel sounds are normal. He exhibits no distension. There is no tenderness.  Lymphadenopathy:  He has no cervical adenopathy.  Skin: Skin is warm and dry. With excoriation Psychiatric: He has a normal mood and affect. His behavior is normal.    Lab Results Recent Labs    12/16/17 0513 12/17/17 0503  WBC 3.0* 3.6*  HGB 9.1* 8.9*  HCT 25.8* 26.2*  NA 129* 130*  K 5.6* 5.0  CL 108 108  CO2 16* 18*  BUN 16 18  CREATININE 0.77 0.82    Microbiology: 1/31 csf pending 1/25 csf c.neoformans + (on day 3 ) 1/18 csf c.neoformans + 1/11 csf c.neoformans + Studies/Results: Dg Fluoro Guide Lumbar Puncture  Result Date: 12/17/2017 CLINICAL DATA:  Cryptococcal meningoencephalitis EXAM: DIAGNOSTIC LUMBAR PUNCTURE UNDER FLUOROSCOPIC GUIDANCE FLUOROSCOPY TIME:  Fluoroscopy Time:  0 minutes, 36 seconds Radiation Exposure Index (if provided by the fluoroscopic device): 14.3 mGy Number of Acquired Spot Images: 0 PROCEDURE: I discussed the risks (including hemorrhage, infection, headache, and nerve damage, among others), benefits, and alternatives to fluoroscopically guided lumbar puncture by telephone with the patient's sister, who is guiding medical decision-making on behalf of the  patient due to his altered mental status. We specifically discussed the high technical likelihood of success of the procedure. She understood and elected for the patient to undergo the procedure. Standard time-out was employed. Following sterile skin prep and local anesthetic administration consisting of 1 percent lidocaine, a 22 gauge spinal needle was advanced without difficulty into the thecal sac at the at the L2-3 level. Clear CSF was returned. Opening pressure was 17 cm of water. 10 cc of  clear CSF was collected. The CSF return was very sluggish. The needle was subsequently removed and the skin cleansed and bandaged. No immediate complications were observed. IMPRESSION: 1. Technically successful fluoroscopically guided lumbar puncture at the L2-3 level. Opening pressure was 17 cm of water. Electronically Signed   By: Gaylyn RongWalter  Liebkemann M.D.   On: 12/17/2017 16:27     Assessment/Plan: Cryptococcal meningitis = would continue on current dosing of L-ampho(dosed at 4mg /kg -temporarily decreased in the setting of aki), plus flucytosine. CSF cx are not sterile yet. He had repeat LP done predominantly for his last LP having elevated OP but the LP from today only had OP of 17, which is good. His cx from 1/25 showed + cryptococcus. Will plan to do minimum of 28 days of L-ampho plus flucytosine if he can tolerate. He may need to extend if cx are still growing crypto. My concern is that he has been non compliant with meds (such as fluconazole last summer/fall) for when we transition him to maintenance phase  He has medicaid so he has access to meds in comparison to last time. May consider getting snf placement after Iv abtx are complete  oi proph = continue with daily bactrim DS plus azithromycin 1200mg  PO Q sundays  Malnutrition = he always appears to be hungry. Suspect to continue giving him protein supplementation to help with malnutrition status  Drug monitoring = does not appear to have any worsening leukopenia from flucytosine nor aki from L-ampho. Continue to monitor.   HIV disease =ART deferred for now   Dr Orvan Falconerampbell to see tomorrow  [facility] Cell: [phone] Pager: [phone]  12/17/2017, 4:31 PM

## 2017-12-17 NOTE — Progress Notes (Signed)
CRITICAL VALUE ALERT  Critical Value:  Spinal Fluid- WBC 29; gram stain- WBC present, predominantly mononuclear, no organisms seen  Date & Time Notied:  12/17/2017 1829  Provider Notified: Dr. Lowell GuitarPowell   Orders Received/Actions taken:

## 2017-12-17 NOTE — Progress Notes (Signed)
PROGRESS NOTE    Kenneth Rivas  ZOX:[id number] DOB: [birth year] DOA: 11/26/2017 PCP: Randall Hiss, MD   Brief Narrative:  57 y.o.male with prior history of HIV/AIDS-last CD4 count less than 10, history of cryptococcal meningitis in 2018 (not very compliant with antifungals) admitted with generalized weakness, near syncopal event. Patient was noted to have a prolonged QTC, and was also found to have recurrent/persistent cryptococcal meningitis and admitted to the hospitalist service. See below for further details  Assessment & Plan:   Principal Problem:   Cryptococcal meningoencephalitis (HCC) Active Problems:   HTN (hypertension)   Cachexia associated with AIDS (HCC)   Hypokalemia   Hyponatremia   Protein-calorie malnutrition, severe   AIDS (acquired immune deficiency syndrome) (HCC)   Prolonged QT interval   Near syncope   Cryptococcal meningitis (HCC)   Anemia   Abnormal echocardiogram   Cardiomyopathy [facility]): Per 2 d echo 11/28/2017   1. 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  Plan to extend therapy until no growth on CSF cx. 1. S/p Lumbar puncture on 12/04/2017 and 1/25 with cryptococcus 2. Appreciate ID recs, recommending repeat LP (today).  3.  Infectious disease following.  2. Hyponatremia-  continue to monitor for now.  Getting IVF boluses prior to and after ampho.  3. Acute kidney injury- Improved.  Suspected related to acyclovir, improved with  IV fluids. ACE inhibitor and Lasix are currently  On hold. Bactrim was held for 2 days.  4. HIV/AIDS- ID following. ART's have been deferred in the setting of redo induction of cryptococcal meningitis. Continue Bactrim for PCP prophylaxis.  Resume azithromycin for MAI prophylaxis. Concern for noncompliance with meds being found under bed.  5. Near syncope- secondary to dehydration/orthostatic hypotension. QTC has normalized. 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   6. Nonischemic cardiomyopathy- He remains compensated-continue with Coreg and ACE inhibitor-appreciate cardiology input. Will need outpatient follow-up with cardiology.  7. QT prolongation- 500's on 1/10, improved to 460's on 1/13  8. Severe protein calorie malnutrition- continue supplements  9. Hypertension- continue coreg, Lisinopril, Lasix.  10. Anemia/leukopenia- stable, likely secondary to HIV.  11. Hyperkalemia: mild, follow repeat BMP in AM  12. NAGMA: continue to monitor, bicarb fluctuating over past few days  DVT prophylaxis: lovenox Code Status: full code Family Communication:none at bedside Disposition Plan: pending  Consultants:   ID  Procedures:   LP on 1/18 and 1/25 and pending 1/31  Antimicrobials:  Anti-infectives (From admission, onward)   Start     Dose/Rate Route Frequency Ordered Stop   12/16/17 1800  amphotericin B liposome (AMBISOME) 200 mg in dextrose 5 % 500 mL IVPB     4 mg/kg  49.6 kg 250 mL/hr over 120 Minutes Intravenous Every 24 hours 12/16/17 1417     12/15/17 1000  azithromycin (ZITHROMAX) tablet 1,200 mg     1,200 mg Oral Weekly 12/10/17 1519     12/10/17 1000  sulfamethoxazole-trimethoprim (BACTRIM DS,SEPTRA DS) 800-160 MG per tablet 1 tablet     1 tablet Oral Daily 12/06/17 1039     12/08/17 1800  azithromycin (ZITHROMAX) tablet 1,200 mg     1,200 mg Oral  Once 12/08/17 1707 12/08/17 1844   12/06/17 1800  amphotericin B liposome (AMBISOME) 150 mg in dextrose 5 % 500 mL IVPB  Status:  Discontinued     3 mg/kg  49.6 kg 250 mL/hr over 120 Minutes Intravenous Every 24 hours 12/06/17  1147 12/16/17 1418   12/04/17 1730  amphotericin B liposome (AMBISOME) 200 mg in dextrose 5 % 500 mL IVPB  Status:  Discontinued     200 mg 250 mL/hr over 120 Minutes Intravenous Every 24 hours 12/04/17 1350 12/06/17 1147   11/27/17 1800  flucytosine (ANCOBON) capsule 1,250 mg     25 mg/kg  47 kg  Oral Every 6 hours 11/27/17 1615     11/27/17 1730  amphotericin B liposome (AMBISOME) 240 mg in dextrose 5 % 500 mL IVPB  Status:  Discontinued     5 mg/kg  47 kg 250 mL/hr over 120 Minutes Intravenous Every 24 hours 11/27/17 1615 12/04/17 1350   11/27/17 1000  sulfamethoxazole-trimethoprim (BACTRIM DS,SEPTRA DS) 800-160 MG per tablet 1 tablet  Status:  Discontinued     1 tablet Oral Daily 11/27/17 0839 12/06/17 1039       Subjective: Asking when he can eat.  Objective: Vitals:   12/16/17 1340 12/16/17 2041 12/17/17 0443 12/17/17 1006  BP: 121/87 126/86 110/71 130/78  Pulse: 86 93 89 (!) 102  Resp: 18 18 17 18   Temp: 98.8 F (37.1 C) 98.2 F (36.8 C) 99.5 F (37.5 C) 99 F (37.2 C)  TempSrc: Oral Oral Oral Oral  SpO2: 100% 100% 100% 100%  Weight:   49.9 kg (110 lb 0.2 oz)   Height:        Intake/Output Summary (Last 24 hours) at 12/17/2017 1512 Last data filed at 12/17/2017 1048 Gross per 24 hour  Intake 26206.67 ml  Output 1600 ml  Net 24606.67 ml   Filed Weights   12/15/17 0401 12/16/17 0426 12/17/17 0443  Weight: 50 kg (110 lb 3.7 oz) 50.1 kg (110 lb 7.2 oz) 49.9 kg (110 lb 0.2 oz)    Examination:  General: No acute distress. cachetic Cardiovascular: Heart sounds show a regular rate, and rhythm. No gallops or rubs. No murmurs. No JVD. Lungs: Clear to auscultation bilaterally with good air movement. No rales, rhonchi or wheezes. Abdomen: Soft, nontender, nondistended with normal active bowel sounds. No masses. No hepatosplenomegaly. Neurological: Alert and oriented 3. Moves all extremities 4. Cranial nerves II through XII grossly intact. Skin: Warm and dry. No rashes or lesions. Extremities: No clubbing or cyanosis. No edema.   Data Reviewed: I have personally reviewed following labs and imaging studies  CBC: Recent Labs  Lab 12/13/17 0407 12/16/17 0513 12/17/17 0503  WBC 3.1* 3.0* 3.6*  HGB 8.2* 9.1* 8.9*  HCT 24.5* 25.8* 26.2*  MCV 81.1 79.1 80.1    PLT 181 169 232   Basic Metabolic Panel: Recent Labs  Lab 12/13/17 0407 12/14/17 0437 12/15/17 0458 12/16/17 0513 12/17/17 0503  NA 131* 133* 131* 129* 130*  K 4.6 4.8 4.9 5.6* 5.0  CL 110 111 107 108 108  CO2 17* 16* 20* 16* 18*  GLUCOSE 100* 96 90 89 100*  BUN 21* 18 16 16 18   CREATININE 0.77 0.70 0.76 0.77 0.82  CALCIUM 8.1* 8.7* 8.7* 8.4* 8.7*  MG 1.7 1.8 1.9 2.0 1.7   GFR: Estimated Creatinine Clearance: 69.3 mL/min (by C-G formula based on SCr of 0.82 mg/dL). Liver Function Tests: No results for input(s): AST, ALT, ALKPHOS, BILITOT, PROT, ALBUMIN in the last 168 hours. No results for input(s): LIPASE, AMYLASE in the last 168 hours. No results for input(s): AMMONIA in the last 168 hours. Coagulation Profile: No results for input(s): INR, PROTIME in the last 168 hours. Cardiac Enzymes: No results for input(s): CKTOTAL, CKMB, CKMBINDEX,  TROPONINI in the last 168 hours. BNP (last 3 results) No results for input(s): PROBNP in the last 8760 hours. HbA1C: No results for input(s): HGBA1C in the last 72 hours. CBG: No results for input(s): GLUCAP in the last 168 hours. Lipid Profile: No results for input(s): CHOL, HDL, LDLCALC, TRIG, CHOLHDL, LDLDIRECT in the last 72 hours. Thyroid Function Tests: No results for input(s): TSH, T4TOTAL, FREET4, T3FREE, THYROIDAB in the last 72 hours. Anemia Panel: No results for input(s): VITAMINB12, FOLATE, FERRITIN, TIBC, IRON, RETICCTPCT in the last 72 hours. Sepsis Labs: No results for input(s): PROCALCITON, LATICACIDVEN in the last 168 hours.  Recent Results (from the past 240 hour(s))  CSF culture     Status: None   Collection Time: 12/11/17  2:20 PM  Result Value Ref Range Status   Specimen Description CSF  Final   Special Requests NONE  Final   Gram Stain   Final    NO ORGANISMS SEEN MODERATE WBC PRESENT, PREDOMINANTLY MONONUCLEAR Gram Stain Report Called to,Read Back By and Verified With: S.BLACKWELL RN AT 1540 ON 12/11/17  BY S.VANHOORNE    Culture   Final    RARE CRYPTOCOCCUS NEOFORMANS Results Called to: KATIE D., RN (WL/4W) AT 1530 ON 12/14/17 BY C. JESSUP, MLT CONCERNING CULTURE GROWTH. CRITICAL RESULT CALLED TO, READ BACK BY AND VERIFIED WITH: K. OBERRY, RN (WL/4W) AT 1525 ON 12/15/17 BY C. JESSUP, MLT. Performed at [facility], [address] [place], [place] [address]    Report Status 12/15/2017 FINAL  Final  Culture, fungus without smear     Status: None (Preliminary result)   Collection Time: 12/11/17  2:20 PM  Result Value Ref Range Status   Specimen Description CSF  Final   Special Requests NONE  Final   Culture   Final    NO FUNGUS ISOLATED AFTER 2 DAYS Performed at [facility], [address], [place], [place] [address]    Report Status PENDING  Incomplete  Fungus Culture With Stain     Status: None (Preliminary result)   Collection Time: 12/11/17  2:20 PM  Result Value Ref Range Status   Fungus Stain Final report  Final    Comment: (NOTE) Performed At: [facility] [address] [place], [place] [id number] Jolene Schimke MD LK:[id number]    Fungus (Mycology) Culture PENDING  Incomplete   Fungal Source PENDING  Incomplete  Fungus Culture Result     Status: None   Collection Time: 12/11/17  2:20 PM  Result Value Ref Range Status   Result 1 Comment  Final    Comment: (NOTE) Fungal elements, such as arthroconidia, hyphal fragments, chlamydoconidia, observed. Performed At: [facility] [address] [place], [place] [id number] Jolene Schimke MD QV:[id number]          Radiology Studies: No results found.      Scheduled Meds: . azithromycin  1,200 mg Oral Weekly  . carvedilol  3.125 mg Oral BID WC  . dextrose  10 mL Intravenous Q24H  . dextrose  10 mL Intravenous Q24H  . docusate  25 mg Both EARS BID  . enoxaparin (LOVENOX) injection  40 mg Subcutaneous Q24H  . flucytosine  25 mg/kg Oral Q6H  . lidocaine      . megestrol  40 mg Oral  Daily  . multivitamin with minerals  1 tablet Oral Daily  . sodium chloride  1,000 mL Intravenous Q24H  . sodium chloride  1,000 mL Intravenous Q24H  . sodium chloride flush  3 mL Intravenous Q12H  . sulfamethoxazole-trimethoprim  1 tablet Oral Daily   Continuous Infusions: . amphotericin  B  Liposome (AMBISOME) ADULT IV Stopped (12/16/17 2014)     LOS: 20 days    Time spent: over 30 min    Lacretia Nicksaldwell Powell, MD [facility] Pager [phone]  If 7PM-7AM, please contact night-coverage [email or web address] Password [email or web address] 12/17/2017, 3:12 PM

## 2017-12-17 NOTE — Procedures (Signed)
CLINICAL DATA: [Cryptococcal meningoencephalitis] EXAM: DIAGNOSTIC LUMBAR PUNCTURE UNDER FLUOROSCOPIC GUIDANCE FLUOROSCOPY TIME: Fluoroscopy Time: [0 minutes, 36 seconds] Radiation Exposure Index (if provided by the fluoroscopic device): [14.3 mGy] Number of Acquired Spot Images: [0] PROCEDURE: I discussed the risks (including hemorrhage, infection, headache, and nerve damage, among others), benefits, and alternatives to fluoroscopically guided lumbar puncture by telephone with the patient's sister, who is guiding medical decision-making on behalf of the patient due to his altered mental status.  We specifically discussed the high technical likelihood of success of the procedure.  She understood and elected for the patient to undergo the procedure.   Standard time-out was employed. Following sterile skin prep and local anesthetic administration consisting of 1 percent lidocaine, a 22 gauge spinal needle was advanced without difficulty into the thecal sac at the at the [L2-3] level. Clear CSF was returned.  Opening pressure was [17 cm of water].   10 cc of clear CSF was collected.  The CSF return was very sluggish.  The needle was subsequently removed and the skin cleansed and bandaged. No immediate complications were observed.  IMPRESSION: [ 1. Technically successful fluoroscopically guided lumbar puncture at the L2-3 level.  Opening pressure was 17 cm of water.  ]

## 2017-12-18 DIAGNOSIS — H9190 Unspecified hearing loss, unspecified ear: Secondary | ICD-10-CM

## 2017-12-18 DIAGNOSIS — B20 Human immunodeficiency virus [HIV] disease: Secondary | ICD-10-CM

## 2017-12-18 LAB — CBC
HCT: 25 % — ABNORMAL LOW (ref 39.0–52.0)
Hemoglobin: 8.4 g/dL — ABNORMAL LOW (ref 13.0–17.0)
MCH: 26.8 pg (ref 26.0–34.0)
MCHC: 33.6 g/dL (ref 30.0–36.0)
MCV: 79.9 fL (ref 78.0–100.0)
PLATELETS: 237 10*3/uL (ref 150–400)
RBC: 3.13 MIL/uL — ABNORMAL LOW (ref 4.22–5.81)
RDW: 15.2 % (ref 11.5–15.5)
WBC: 3.6 10*3/uL — ABNORMAL LOW (ref 4.0–10.5)

## 2017-12-18 LAB — BASIC METABOLIC PANEL
Anion gap: 4 — ABNORMAL LOW (ref 5–15)
BUN: 19 mg/dL (ref 6–20)
CALCIUM: 8.8 mg/dL — AB (ref 8.9–10.3)
CO2: 18 mmol/L — AB (ref 22–32)
Chloride: 108 mmol/L (ref 101–111)
Creatinine, Ser: 0.78 mg/dL (ref 0.61–1.24)
GFR calc Af Amer: >60 mL/min (ref >60)
GLUCOSE: 94 mg/dL (ref 65–99)
Potassium: 4.8 mmol/L (ref 3.5–5.1)
Sodium: 130 mmol/L — ABNORMAL LOW (ref 135–145)

## 2017-12-18 LAB — CULTURE, FUNGUS WITHOUT SMEAR

## 2017-12-18 LAB — PATHOLOGIST SMEAR REVIEW

## 2017-12-18 LAB — MAGNESIUM: Magnesium: 1.9 mg/dL (ref 1.7–2.4)

## 2017-12-18 MED ORDER — MAGNESIUM SULFATE IN D5W 1-5 GM/100ML-% IV SOLN
1.0000 g | Freq: Once | INTRAVENOUS | Status: AC
  Administered 2017-12-18: 1 g via INTRAVENOUS
  Filled 2017-12-18: qty 100

## 2017-12-18 NOTE — Progress Notes (Signed)
Occupational Therapy Treatment and goal update Patient Details Name: Kenneth Rivas MRN: [id number] DOB: 28-Jul-1961 Today's Date: 12/18/2017    History of present illness 57 yo male admitted with weakness, near syncopal episode.  Dx:  cryptococcal meningoencephalitis.  Hx of HIV/AIDS, Hep B, drug abuse, noncompliance, cachexia, anemia.    OT comments  Pt stood at sink to wash hands, walked in the hall, and returned to room to use commode.  Would not attempt toilet hygiene at this time.  Pt is progressing; goals updated today  Follow Up Recommendations  SNF    Equipment Recommendations  3 in 1 bedside commode    Recommendations for Other Services      Precautions / Restrictions Precautions Precautions: Fall Precaution Comments: unsteady gait Restrictions Weight Bearing Restrictions: No       Mobility Bed Mobility Overal bed mobility: Needs Assistance Bed Mobility: Supine to Sit     Supine to sit: Supervision;HOB elevated     General bed mobility comments: supervision for lines  Transfers Overall transfer level: Needs assistance Equipment used: Rolling walker (2 wheeled) Transfers: Sit to/from Stand Sit to Stand: Min assist Stand pivot transfers: Min assist       General transfer comment: verbal cues for safe technique, assist to rise and steady, cues for not sitting while turning    Balance Overall balance assessment: Needs assistance Sitting-balance support: Feet supported;No upper extremity supported Sitting balance-Leahy Scale: Good     Standing balance support: Bilateral upper extremity supported Standing balance-Leahy Scale: Poor Standing balance comment: UE support for balance                           ADL either performed or assessed with clinical judgement   ADL       Grooming: Min guard;Standing                   Toilet Transfer: Minimal assistance;BSC;RW;Ambulation   Toileting- Clothing Manipulation and Hygiene: Total  assistance;Sit to/from stand         General ADL Comments: Pt would not attempt to perform hygiene.  States "I can't"     Vision       Perception     Praxis      Cognition Arousal/Alertness: Awake/alert Behavior During Therapy: WFL for tasks assessed/performed Overall Cognitive Status: No family/caregiver present to determine baseline cognitive functioning Area of Impairment: Problem solving;Safety/judgement                     Memory: Decreased short-term memory   Safety/Judgement: Decreased awareness of safety;Decreased awareness of deficits   Problem Solving: Requires verbal cues General Comments: very HOH and at high risk for falls        Exercises     Shoulder Instructions       General Comments      Pertinent Vitals/ Pain       Pain Assessment: No/denies pain  Home Living                                          Prior Functioning/Environment              Frequency  Min 2X/week        Progress Toward Goals  OT Goals(current goals can now be found in the care plan section)  Progress towards OT goals: Progressing  toward goals  Acute Rehab OT Goals Patient Stated Goal: home OT Goal Formulation: With patient Time For Goal Achievement: 01/01/18 Potential to Achieve Goals: Fair ADL Goals Pt Will Perform Grooming: with min guard assist;standing(x 2 tasks) Pt Will Transfer to Toilet: with min guard assist;ambulating;bedside commode Additional ADL Goal #1: pt will perform toilet hygiene with min A, sit to stand  Plan      Co-evaluation      Reason for Co-Treatment: For patient/therapist safety PT goals addressed during session: Mobility/safety with mobility OT goals addressed during session: ADL's and self-care      AM-PAC PT "6 Clicks" Daily Activity     Outcome Measure   Help from another person eating meals?: A Little Help from another person taking care of personal grooming?: A Little Help from another  person toileting, which includes using toliet, bedpan, or urinal?: A Lot Help from another person bathing (including washing, rinsing, drying)?: A Lot Help from another person to put on and taking off regular upper body clothing?: A Lot Help from another person to put on and taking off regular lower body clothing?: Total 6 Click Score: 13    End of Session    OT Visit Diagnosis: Muscle weakness (generalized) (M62.81)   Activity Tolerance Patient tolerated treatment well   Patient Left in chair;with call bell/phone within reach;with nursing/sitter in room   Nurse Communication          Time: [id number] OT Time Calculation (min): 28 min  Charges: OT General Charges $OT Visit: 1 Visit OT Treatments $Self Care/Home Management : 8-22 mins  Kenneth Rivas, OTR/L [phone] 12/18/2017   Kenneth Rivas 12/18/2017, 1:05 PM

## 2017-12-18 NOTE — Progress Notes (Signed)
CRITICAL VALUE ALERT  Critical Value:  Cryptococcal Antigen Test - positive  Date & Time Notied:  0138  Provider Notified:  Bodenheimer  Orders Received/Actions taken:

## 2017-12-18 NOTE — Plan of Care (Signed)
  Progressing Education: Knowledge of General Education information will improve 12/18/2017 2209 - Progressing by Cristela FeltSteffens, Analiya Porco P, RN Health Behavior/Discharge Planning: Ability to manage health-related needs will improve 12/18/2017 2209 - Progressing by Cristela FeltSteffens, Terrian Sentell P, RN Clinical Measurements: Ability to maintain clinical measurements within normal limits will improve 12/18/2017 2209 - Progressing by Cristela FeltSteffens, Marcellis Frampton P, RN Safety: Ability to remain free from injury will improve 12/18/2017 2209 - Progressing by Cristela FeltSteffens, Diane Mochizuki P, RN

## 2017-12-18 NOTE — Progress Notes (Signed)
Physical Therapy Treatment Patient Details Name: Kenneth Rivas MRN: [id number] DOB: [birth year] Today's Date: 12/18/2017    History of Present Illness 57 yo male admitted with weakness, near syncopal episode.  Dx:  cryptococcal meningoencephalitis.  Hx of HIV/AIDS, Hep B, drug abuse, noncompliance, cachexia, anemia.     PT Comments    Pt progressing slowly and continues to be a high fall risk 2* significant gait instability and poor safety awareness.  Pt would benefit from follow up rehab at SNF level to maximize IND and safety prior to possible return home.    Follow Up Recommendations  SNF     Equipment Recommendations  Wheelchair cushion (measurements PT);Wheelchair (measurements PT)    Recommendations for Other Services       Precautions / Restrictions Precautions Precautions: Fall Precaution Comments: unsteady gait Restrictions Weight Bearing Restrictions: No    Mobility  Bed Mobility Overal bed mobility: Needs Assistance Bed Mobility: Supine to Sit     Supine to sit: Supervision;HOB elevated     General bed mobility comments: supervision for lines  Transfers Overall transfer level: Needs assistance Equipment used: Rolling walker (2 wheeled) Transfers: Sit to/from Stand Sit to Stand: Min assist Stand pivot transfers: Min assist       General transfer comment: verbal cues for safe technique, assist to rise and steady, cues for not sitting while turning  Ambulation/Gait Ambulation/Gait assistance: Min assist Ambulation Distance (Feet): 65 Feet(65' twice with seated rest break between) Assistive device: Rolling walker (2 wheeled) Gait Pattern/deviations: Step-through pattern;Decreased stride length;Trunk flexed;Shuffle Gait velocity: decr Gait velocity interpretation: Below normal speed for age/gender General Gait Details: assist for steadying, cues for RW positioning (tends to keep too far forward), unsteady gait with short shuffling steps, fatigues quickly  requesting seated break after 65 feet.   Stairs            Wheelchair Mobility    Modified Rankin (Stroke Patients Only)       Balance Overall balance assessment: Needs assistance Sitting-balance support: Feet supported;No upper extremity supported Sitting balance-Leahy Scale: Good     Standing balance support: Bilateral upper extremity supported Standing balance-Leahy Scale: Poor Standing balance comment: UE support for balance                            Cognition Arousal/Alertness: Awake/alert Behavior During Therapy: WFL for tasks assessed/performed Overall Cognitive Status: No family/caregiver present to determine baseline cognitive functioning Area of Impairment: Problem solving;Safety/judgement                     Memory: Decreased short-term memory   Safety/Judgement: Decreased awareness of safety;Decreased awareness of deficits   Problem Solving: Requires verbal cues General Comments: very HOH and at high risk for falls      Exercises      General Comments        Pertinent Vitals/Pain Pain Assessment: No/denies pain    Home Living                      Prior Function            PT Goals (current goals can now be found in the care plan section) Acute Rehab PT Goals Patient Stated Goal: home PT Goal Formulation: With patient Time For Goal Achievement: 12/30/17 Potential to Achieve Goals: Fair Progress towards PT goals: Progressing toward goals    Frequency    Min 3X/week  PT Plan Current plan remains appropriate    Co-evaluation PT/OT/SLP Co-Evaluation/Treatment: Yes Reason for Co-Treatment: For patient/therapist safety PT goals addressed during session: Mobility/safety with mobility OT goals addressed during session: ADL's and self-care      AM-PAC PT "6 Clicks" Daily Activity  Outcome Measure  Difficulty turning over in bed (including adjusting bedclothes, sheets and blankets)?: A  Little Difficulty moving from lying on back to sitting on the side of the bed? : A Little Difficulty sitting down on and standing up from a chair with arms (e.g., wheelchair, bedside commode, etc,.)?: Unable Help needed moving to and from a bed to chair (including a wheelchair)?: A Little Help needed walking in hospital room?: A Little Help needed climbing 3-5 steps with a railing? : A Little 6 Click Score: 16    End of Session Equipment Utilized During Treatment: Gait belt Activity Tolerance: Patient limited by fatigue Patient left: in chair;with call bell/phone within reach;with chair alarm set Nurse Communication: Mobility status PT Visit Diagnosis: Muscle weakness (generalized) (M62.81);Difficulty in walking, not elsewhere classified (R26.2)     Time: 9147-8295 PT Time Calculation (min) (ACUTE ONLY): 28 min  Charges:  $Gait Training: 8-22 mins                    G Codes:       Pg [phone]    Vaudine Dutan 12/18/2017, 12:19 PM

## 2017-12-18 NOTE — Progress Notes (Signed)
Patient ID: Kenneth Rivas, male   DOB: 01-06-1961, 57 y.o.   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  Yesterday's repeat lumbar puncture showed 29 white blood cells, a decreased protein of 55 and yeast on the smear.  His opening pressure was back to normal at 17 cm his cultures are pending.  His most recent CSF cultures on 12/11/2016 were still positive.  Kenneth Rivas will need to continue on his second round of induction therapy at least until we know cultures are negative.  I am very concerned about his ability to manage patient living with his roommate in a boardinghouse.  Kenneth Rivas does not seem inclined to accept placement in assisted living or a skilled nursing facility.  PLAN: 1. Continue amphotericin and flucytosine 2. Await results of final CSF cultures 3. Please call Dr. Enedina Finner [phone] for any infectious disease questions this weekend  Principal Problem:   Cryptococcal meningoencephalitis (HCC) Active Problems:   AIDS (acquired immune deficiency syndrome) (HCC)   HTN (hypertension)   Cachexia associated with AIDS (HCC)   Hypokalemia   Hyponatremia   Protein-calorie malnutrition, severe   Prolonged QT interval   Near syncope   Cryptococcal meningitis (HCC)   Anemia   Abnormal echocardiogram   Cardiomyopathy [facility]): Per 2 d echo 11/28/2017   Scheduled Meds: . azithromycin  1,200 mg Oral Weekly  . carvedilol  3.125 mg Oral BID WC  . dextrose  10 mL Intravenous Q24H  . dextrose  10 mL Intravenous Q24H  . docusate  25 mg Both EARS BID  . enoxaparin (LOVENOX) injection  40 mg Subcutaneous Q24H  . flucytosine  25 mg/kg Oral Q6H  . megestrol  40 mg Oral Daily  . multivitamin with minerals  1 tablet Oral Daily  . sodium chloride  1,000 mL Intravenous Q24H  . sodium chloride  1,000 mL Intravenous Q24H  . sodium  chloride flush  3 mL Intravenous Q12H  . sulfamethoxazole-trimethoprim  1 tablet Oral Daily   Continuous Infusions: . amphotericin  B  Liposome (AMBISOME) ADULT IV Stopped (12/17/17 2100)  . magnesium sulfate 1 - 4 g bolus IVPB     PRN Meds:.acetaminophen **OR** [DISCONTINUED] acetaminophen, hydrALAZINE, meperidine (DEMEROL) injection   SUBJECTIVE: Kenneth Rivas tells me that Kenneth Rivas wants to go home.  Kenneth Rivas says that Kenneth Rivas does not have any headache, nausea or vomiting.  Kenneth Rivas cannot tell me why Kenneth Rivas is in the hospital other than the fact that his roommate said that Kenneth Rivas passed out.  Review of Systems: Review of Systems  Constitutional: Negative for chills, diaphoresis and fever.  Gastrointestinal: Negative for abdominal pain, diarrhea, nausea and vomiting.  Neurological: Negative for headaches.    No Known Allergies  OBJECTIVE: Vitals:   12/17/17 2130 12/18/17 0500 12/18/17 0611 12/18/17 1000  BP: 136/73  (!) 159/98 119/62  Pulse: 89  86 (!) 115  Resp: 18  18   Temp: 98.6 F (37 C)  98.9 F (37.2 C)   TempSrc: Oral  Oral   SpO2: 100%  100% 98%  Weight:  106 lb 7.7 oz (48.3 kg)    Height:       Body mass index is 17.19 kg/m.  Physical Exam  Constitutional: Kenneth Rivas is oriented to person, place, and  time.  Kenneth Rivas is thin and cachectic.  Kenneth Rivas is in no distress.  Kenneth Rivas seems to be slightly hard of hearing.  HENT:  Mouth/Throat: No oropharyngeal exudate.  Eyes: Conjunctivae are normal.  Neck: Neck supple.  Cardiovascular: Normal rate and regular rhythm.  No murmur heard. Pulmonary/Chest: Effort normal and breath sounds normal.  Abdominal: Soft. Kenneth Rivas exhibits no distension. There is no tenderness.  Neurological: Kenneth Rivas is alert and oriented to person, place, and time.  Skin: No rash noted.  Psychiatric: Mood and affect normal.    Lab Results Lab Results  Component Value Date   WBC 3.6 (L) 12/18/2017   HGB 8.4 (L) 12/18/2017   HCT 25.0 (L) 12/18/2017   MCV 79.9 12/18/2017   PLT 237 12/18/2017    Lab Results   Component Value Date   CREATININE 0.78 12/18/2017   BUN 19 12/18/2017   NA 130 (L) 12/18/2017   K 4.8 12/18/2017   CL 108 12/18/2017   CO2 18 (L) 12/18/2017    Lab Results  Component Value Date   ALT 102 (H) 12/07/2017   AST 57 (H) 12/07/2017   ALKPHOS 104 12/07/2017   BILITOT 0.4 12/07/2017     Microbiology: Recent Results (from the past 240 hour(s))  CSF culture     Status: None   Collection Time: 12/11/17  2:20 PM  Result Value Ref Range Status   Specimen Description CSF  Final   Special Requests NONE  Final   Gram Stain   Final    NO ORGANISMS SEEN MODERATE WBC PRESENT, PREDOMINANTLY MONONUCLEAR Gram Stain Report Called to,Read Back By and Verified With: S.BLACKWELL RN AT 1540 ON 12/11/17 BY S.VANHOORNE    Culture   Final    RARE CRYPTOCOCCUS NEOFORMANS Results Called to: KATIE D., RN (WL/4W) AT 1530 ON 12/14/17 BY C. JESSUP, MLT CONCERNING CULTURE GROWTH. CRITICAL RESULT CALLED TO, READ BACK BY AND VERIFIED WITH: K. OBERRY, RN (WL/4W) AT 1525 ON 12/15/17 BY C. JESSUP, MLT. Performed at [facility], [address], [place], [place] [address]    Report Status 12/15/2017 FINAL  Final  Culture, fungus without smear     Status: None (Preliminary result)   Collection Time: 12/11/17  2:20 PM  Result Value Ref Range Status   Specimen Description CSF  Final   Special Requests NONE  Final   Culture   Final    NO FUNGUS ISOLATED AFTER 2 DAYS Performed at [facility], [address], [place], [place] [address]    Report Status PENDING  Incomplete  Fungus Culture With Stain     Status: None (Preliminary result)   Collection Time: 12/11/17  2:20 PM  Result Value Ref Range Status   Fungus Stain Final report  Final    Comment: (NOTE) Performed At: [facility] [address] [place], [place] [id number] Jolene Schimke MD WG:[id number]    Fungus (Mycology) Culture PENDING  Incomplete   Fungal Source PENDING  Incomplete  Fungus Culture Result     Status:  None   Collection Time: 12/11/17  2:20 PM  Result Value Ref Range Status   Result 1 Comment  Final    Comment: (NOTE) Fungal elements, such as arthroconidia, hyphal fragments, chlamydoconidia, observed. Performed At: [facility] [address] [place], [place] [id number] Jolene Schimke MD MW:[id number]   CSF culture     Status: None (Preliminary result)   Collection Time: 12/17/17  4:56 PM  Result Value Ref Range Status   Specimen  Description CSF  Final   Special Requests NONE  Final   Gram Stain   Final    WBC PRESENT, PREDOMINANTLY MONONUCLEAR NO ORGANISMS SEEN CYTOSPIN Gram Stain Report Called to,Read Back By and Verified With: JESSICA DEUTSCH,RN [phone] @ 1830 BY J SCOTTON ENCAPSULATED YEAST SEEN Performed at [facility], [address] [place], [place] [address]    Culture PENDING  Incomplete   Report Status PENDING  Incomplete    Cliffton AstersJohn Calinda Stockinger, MD [facility] [phone] pager   [phone] 12/18/2017, 11:08 AM

## 2017-12-18 NOTE — Progress Notes (Signed)
PROGRESS NOTE    Kenneth Rivas  ZOX:[id number] DOB: 11-Dec-1960 DOA: 11/26/2017 PCP: Kenneth Hiss, MD   Brief Narrative:  57 y.o.male with prior history of HIV/AIDS-last CD4 count less than 10, history of cryptococcal meningitis in 2018 (not very compliant with antifungals) admitted with generalized weakness, near syncopal event. Patient was noted to have Kenneth Rivas prolonged QTC, and was also found to have recurrent/persistent cryptococcal meningitis and admitted to the hospitalist service. See below for further details  Assessment & Plan:   Principal Problem:   Cryptococcal meningoencephalitis (HCC) Active Problems:   HTN (hypertension)   Cachexia associated with AIDS (HCC)   Hypokalemia   Hyponatremia   Protein-calorie malnutrition, severe   AIDS (acquired immune deficiency syndrome) (HCC)   Prolonged QT interval   Near syncope   Cryptococcal meningitis (HCC)   Anemia   Abnormal echocardiogram   Cardiomyopathy [facility]): Per 2 d echo 11/28/2017   1. 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  Plan to extend therapy until no growth on CSF cx. 1. S/p Lumbar puncture on 12/04/2017 and 1/25 with cryptococcus 2. Repeat CSF cx from 1/31 pending (positive for crypto antigen).  OP 17, WBC 29, protein 55, 14 RBC's.  Yeast on gram stain.  3. Appreciate ID recs, recommending repeat LP (today).  4.  Infectious disease following.  2. Hyponatremia-  continue to monitor for now.  Getting IVF boluses prior to and after ampho.  3. Acute kidney injury- Improved.  Suspected related to acyclovir, improved with  IV fluids. ACE inhibitor and Lasix are currently  On hold. Bactrim was held for 2 days.  4. HIV/AIDS- ID following. ART's have been deferred in the setting of redo induction of cryptococcal meningitis. Continue Bactrim for PCP prophylaxis.  Resume azithromycin for MAI prophylaxis. Concern for noncompliance  with meds being found under bed.  5. Near syncope- secondary to dehydration/orthostatic hypotension. QTC has normalized. 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   6. Nonischemic cardiomyopathy- He remains compensated-continue with Coreg and ACE inhibitor-appreciate cardiology input. Will need outpatient follow-up with cardiology.  7. QT prolongation- 500's on 1/10, improved to 460's on 1/13  8. Severe protein calorie malnutrition- continue supplements  9. Hypertension- continue coreg.  Hold Lisinopril, Lasix.  10. Anemia/leukopenia- stable, likely secondary to HIV.  11. Hyperkalemia: mild, follow repeat BMP in AM  12. NAGMA: continue to monitor, bicarb fluctuating over past few days  # Question of his capacity.  Will c/s psych as PT recommending SNF.  Not sure he'll be agreeable to this. He has history of noncompliance and doesn't clearly demonstrate to me that he understands why he's here.  Will c/s psych to see if he has capacity to decline SNF placement.     DVT prophylaxis: lovenox Code Status: full code Family Communication:none at bedside Disposition Plan: pending  Consultants:   ID  Procedures:   LP on 1/18 and 1/25 and pending 1/31  Antimicrobials:  Anti-infectives (From admission, onward)   Start     Dose/Rate Route Frequency Ordered Stop   12/16/17 1800  amphotericin B liposome (AMBISOME) 200 mg in dextrose 5 % 500 mL IVPB     4 mg/kg  49.6 kg 250 mL/hr over 120 Minutes Intravenous Every 24 hours 12/16/17 1417     12/15/17 1000  azithromycin (ZITHROMAX) tablet 1,200 mg     1,200 mg Oral Weekly 12/10/17 1519     12/10/17 1000  sulfamethoxazole-trimethoprim (BACTRIM  DS,SEPTRA DS) 800-160 MG per tablet 1 tablet     1 tablet Oral Daily 12/06/17 1039     12/08/17 1800  azithromycin (ZITHROMAX) tablet 1,200 mg     1,200 mg Oral  Once 12/08/17 1707 12/08/17 1844   12/06/17 1800  amphotericin B liposome (AMBISOME) 150 mg in dextrose 5 % 500 mL  IVPB  Status:  Discontinued     3 mg/kg  49.6 kg 250 mL/hr over 120 Minutes Intravenous Every 24 hours 12/06/17 1147 12/16/17 1418   12/04/17 1730  amphotericin B liposome (AMBISOME) 200 mg in dextrose 5 % 500 mL IVPB  Status:  Discontinued     200 mg 250 mL/hr over 120 Minutes Intravenous Every 24 hours 12/04/17 1350 12/06/17 1147   11/27/17 1800  flucytosine (ANCOBON) capsule 1,250 mg     25 mg/kg  47 kg Oral Every 6 hours 11/27/17 1615     11/27/17 1730  amphotericin B liposome (AMBISOME) 240 mg in dextrose 5 % 500 mL IVPB  Status:  Discontinued     5 mg/kg  47 kg 250 mL/hr over 120 Minutes Intravenous Every 24 hours 11/27/17 1615 12/04/17 1350   11/27/17 1000  sulfamethoxazole-trimethoprim (BACTRIM DS,SEPTRA DS) 800-160 MG per tablet 1 tablet  Status:  Discontinued     1 tablet Oral Daily 11/27/17 0839 12/06/17 1039       Subjective: Asking when he can eat.  Objective: Vitals:   12/18/17 0500 12/18/17 0611 12/18/17 1000 12/18/17 1446  BP:  (!) 159/98 119/62 107/81  Pulse:  86 (!) 115 (!) 109  Resp:  18  18  Temp:  98.9 F (37.2 C)  99.1 F (37.3 C)  TempSrc:  Oral  Oral  SpO2:  100% 98% 100%  Weight: 48.3 kg (106 lb 7.7 oz)     Height:        Intake/Output Summary (Last 24 hours) at 12/18/2017 1625 Last data filed at 12/18/2017 1400 Gross per 24 hour  Intake 720 ml  Output 1575 ml  Net -855 ml   Filed Weights   12/16/17 0426 12/17/17 0443 12/18/17 0500  Weight: 50.1 kg (110 lb 7.2 oz) 49.9 kg (110 lb 0.2 oz) 48.3 kg (106 lb 7.7 oz)    Examination:  General: No acute distress. cachetic Cardiovascular: Heart sounds show Kenneth Rivas regular rate, and rhythm. No gallops or rubs. No murmurs. No JVD. Lungs: Clear to auscultation bilaterally with good air movement. No rales, rhonchi or wheezes. Abdomen: Soft, nontender, nondistended with normal active bowel sounds. No masses. No hepatosplenomegaly. Neurological: Alert and oriented 3. Moves all extremities 4. Cranial nerves II  through XII grossly intact. Skin: Warm and dry. No rashes or lesions. Extremities: No clubbing or cyanosis. No edema.  Data Reviewed: I have personally reviewed following labs and imaging studies  CBC: Recent Labs  Lab 12/13/17 0407 12/16/17 0513 12/17/17 0503 12/18/17 0423  WBC 3.1* 3.0* 3.6* 3.6*  HGB 8.2* 9.1* 8.9* 8.4*  HCT 24.5* 25.8* 26.2* 25.0*  MCV 81.1 79.1 80.1 79.9  PLT 181 169 232 237   Basic Metabolic Panel: Recent Labs  Lab 12/14/17 0437 12/15/17 0458 12/16/17 0513 12/17/17 0503 12/18/17 0423  NA 133* 131* 129* 130* 130*  K 4.8 4.9 5.6* 5.0 4.8  CL 111 107 108 108 108  CO2 16* 20* 16* 18* 18*  GLUCOSE 96 90 89 100* 94  BUN 18 16 16 18 19   CREATININE 0.70 0.76 0.77 0.82 0.78  CALCIUM 8.7* 8.7* 8.4* 8.7* 8.8*  MG 1.8 1.9 2.0 1.7 1.9   GFR: Estimated Creatinine Clearance: 68.8 mL/min (by C-G formula based on SCr of 0.78 mg/dL). Liver Function Tests: No results for input(s): AST, ALT, ALKPHOS, BILITOT, PROT, ALBUMIN in the last 168 hours. No results for input(s): LIPASE, AMYLASE in the last 168 hours. No results for input(s): AMMONIA in the last 168 hours. Coagulation Profile: No results for input(s): INR, PROTIME in the last 168 hours. Cardiac Enzymes: No results for input(s): CKTOTAL, CKMB, CKMBINDEX, TROPONINI in the last 168 hours. BNP (last 3 results) No results for input(s): PROBNP in the last 8760 hours. HbA1C: No results for input(s): HGBA1C in the last 72 hours. CBG: No results for input(s): GLUCAP in the last 168 hours. Lipid Profile: No results for input(s): CHOL, HDL, LDLCALC, TRIG, CHOLHDL, LDLDIRECT in the last 72 hours. Thyroid Function Tests: No results for input(s): TSH, T4TOTAL, FREET4, T3FREE, THYROIDAB in the last 72 hours. Anemia Panel: No results for input(s): VITAMINB12, FOLATE, FERRITIN, TIBC, IRON, RETICCTPCT in the last 72 hours. Sepsis Labs: No results for input(s): PROCALCITON, LATICACIDVEN in the last 168  hours.  Recent Results (from the past 240 hour(s))  CSF culture     Status: None   Collection Time: 12/11/17  2:20 PM  Result Value Ref Range Status   Specimen Description CSF  Final   Special Requests NONE  Final   Gram Stain   Final    NO ORGANISMS SEEN MODERATE WBC PRESENT, PREDOMINANTLY MONONUCLEAR Gram Stain Report Called to,Read Back By and Verified With: S.BLACKWELL RN AT 1540 ON 12/11/17 BY S.VANHOORNE    Culture   Final    RARE CRYPTOCOCCUS NEOFORMANS Results Called to: KATIE D., RN (WL/4W) AT 1530 ON 12/14/17 BY C. JESSUP, MLT CONCERNING CULTURE GROWTH. CRITICAL RESULT CALLED TO, READ BACK BY AND VERIFIED WITH: K. OBERRY, RN (WL/4W) AT 1525 ON 12/15/17 BY C. JESSUP, MLT. Performed at [facility], [address], [place], [place] [address]    Report Status 12/15/2017 FINAL  Final  Culture, fungus without smear     Status: None (Preliminary result)   Collection Time: 12/11/17  2:20 PM  Result Value Ref Range Status   Specimen Description   Final    CSF Performed at [facility], [address], [place], [place] [address]    Special Requests   Final    NONE Performed at [facility], [address], [place], [place] [address]    Culture   Final    CULTURE REINCUBATED FOR BETTER GROWTH Performed at [facility], [address], [place], [place] [address]    Report Status PENDING  Incomplete  Fungus Culture With Stain     Status: None (Preliminary result)   Collection Time: 12/11/17  2:20 PM  Result Value Ref Range Status   Fungus Stain Final report  Final    Comment: (NOTE) Performed At: [facility] [address] [place], [place] [id number] Jolene Schimke MD [id number]    Fungus (Mycology) Culture PENDING  Incomplete   Fungal Source PENDING  Incomplete  Fungus Culture Result     Status: None   Collection Time: 12/11/17  2:20 PM  Result Value Ref Range Status   Result 1 Comment  Final    Comment:  (NOTE) Fungal elements, such as arthroconidia, hyphal fragments, chlamydoconidia, observed. Performed At: [facility] [address] [place], [place] [id number] Jolene Schimke MD KZ:[id number]   CSF culture     Status:  None (Preliminary result)   Collection Time: 12/17/17  4:56 PM  Result Value Ref Range Status   Specimen Description CSF  Final   Special Requests NONE  Final   Gram Stain   Final    WBC PRESENT, PREDOMINANTLY MONONUCLEAR NO ORGANISMS SEEN CYTOSPIN Gram Stain Report Called to,Read Back By and Verified With: JESSICA DEUTSCH,RN [phone] @ 1830 BY J SCOTTON ENCAPSULATED YEAST SEEN Performed at [facility], [address] [place], [place] [id number]    Culture PENDING  Incomplete   Report Status PENDING  Incomplete         Radiology Studies: Dg Fluoro Guide Lumbar Puncture  Result Date: 12/17/2017 CLINICAL DATA:  Cryptococcal meningoencephalitis EXAM: DIAGNOSTIC LUMBAR PUNCTURE UNDER FLUOROSCOPIC GUIDANCE FLUOROSCOPY TIME:  Fluoroscopy Time:  0 minutes, 36 seconds Radiation Exposure Index (if provided by the fluoroscopic device): 14.3 mGy Number of Acquired Spot Images: 0 PROCEDURE: I discussed the risks (including hemorrhage, infection, headache, and nerve damage, among others), benefits, and alternatives to fluoroscopically guided lumbar puncture by telephone with the patient's sister, who is guiding medical decision-making on behalf of the patient due to his altered mental status. We specifically discussed the high technical likelihood of success of the procedure. She understood and elected for the patient to undergo the procedure. Standard time-out was employed. Following sterile skin prep and local anesthetic administration consisting of 1 percent lidocaine, Kaliel Bolds 22 gauge spinal needle was advanced without difficulty into the thecal sac at the at the L2-3 level. Clear CSF was returned. Opening pressure was 17 cm of water. 10 cc of clear CSF was collected. The  CSF return was very sluggish. The needle was subsequently removed and the skin cleansed and bandaged. No immediate complications were observed. IMPRESSION: 1. Technically successful fluoroscopically guided lumbar puncture at the L2-3 level. Opening pressure was 17 cm of water. Electronically Signed   By: Gaylyn RongWalter  Liebkemann M.D.   On: 12/17/2017 16:27        Scheduled Meds: . azithromycin  1,200 mg Oral Weekly  . carvedilol  3.125 mg Oral BID WC  . dextrose  10 mL Intravenous Q24H  . dextrose  10 mL Intravenous Q24H  . docusate  25 mg Both EARS BID  . enoxaparin (LOVENOX) injection  40 mg Subcutaneous Q24H  . flucytosine  25 mg/kg Oral Q6H  . megestrol  40 mg Oral Daily  . multivitamin with minerals  1 tablet Oral Daily  . sodium chloride  1,000 mL Intravenous Q24H  . sodium chloride  1,000 mL Intravenous Q24H  . sodium chloride flush  3 mL Intravenous Q12H  . sulfamethoxazole-trimethoprim  1 tablet Oral Daily   Continuous Infusions: . amphotericin  B  Liposome (AMBISOME) ADULT IV Stopped (12/17/17 2100)     LOS: 21 days    Time spent: over 30 min    Lacretia Nicksaldwell Powell, MD [facility] Pager [phone]  If 7PM-7AM, please contact night-coverage [email or web address] Password [email or web address] 12/18/2017, 4:25 PM

## 2017-12-18 NOTE — Plan of Care (Signed)
  Safety: Ability to remain free from injury will improve 12/18/2017 1427 - Progressing by William Daltonarpenter, Sharnee Douglass L, RN   Clinical Measurements: Will remain free from infection 12/18/2017 1427 - Progressing by William Daltonarpenter, Zakhai Meisinger L, RN   Clinical Measurements: Ability to maintain clinical measurements within normal limits will improve 12/18/2017 1427 - Progressing by William Daltonarpenter, Dawan Farney L, RN

## 2017-12-18 NOTE — Progress Notes (Signed)
CSW following patient for SNF placement.  Patient currently has no bed offers.  CSW still seeking placement for patient.  CSW will continue to follow and assist with discharge planning.  Celso SickleKimberly Helio Lack, [place] Clinical Social Worker [facility] Cell#: [phone]

## 2017-12-18 NOTE — Progress Notes (Signed)
Assumed care of patient from previous nurse. Agree with previous nurses assessment. Will continue to monitor.  

## 2017-12-19 LAB — CBC
HEMATOCRIT: 25.9 % — AB (ref 39.0–52.0)
HEMOGLOBIN: 8.7 g/dL — AB (ref 13.0–17.0)
MCH: 26.9 pg (ref 26.0–34.0)
MCHC: 33.6 g/dL (ref 30.0–36.0)
MCV: 79.9 fL (ref 78.0–100.0)
Platelets: 217 10*3/uL (ref 150–400)
RBC: 3.24 MIL/uL — AB (ref 4.22–5.81)
RDW: 15.2 % (ref 11.5–15.5)
WBC: 3.5 10*3/uL — ABNORMAL LOW (ref 4.0–10.5)

## 2017-12-19 LAB — BASIC METABOLIC PANEL
ANION GAP: 7 (ref 5–15)
BUN: 17 mg/dL (ref 6–20)
CALCIUM: 8.5 mg/dL — AB (ref 8.9–10.3)
CO2: 15 mmol/L — AB (ref 22–32)
Chloride: 107 mmol/L (ref 101–111)
Creatinine, Ser: 0.84 mg/dL (ref 0.61–1.24)
GFR calc non Af Amer: >60 mL/min (ref >60)
GLUCOSE: 84 mg/dL (ref 65–99)
POTASSIUM: 4.9 mmol/L (ref 3.5–5.1)
Sodium: 129 mmol/L — ABNORMAL LOW (ref 135–145)

## 2017-12-19 LAB — MAGNESIUM: Magnesium: 1.7 mg/dL (ref 1.7–2.4)

## 2017-12-19 MED ORDER — MAGNESIUM SULFATE 2 GM/50ML IV SOLN
2.0000 g | Freq: Once | INTRAVENOUS | Status: AC
  Administered 2017-12-19: 2 g via INTRAVENOUS
  Filled 2017-12-19: qty 50

## 2017-12-19 MED ORDER — MAGNESIUM SULFATE 2 GM/50ML IV SOLN
2.0000 g | Freq: Once | INTRAVENOUS | Status: AC
  Administered 2017-12-20: 2 g via INTRAVENOUS
  Filled 2017-12-19: qty 50

## 2017-12-19 MED ORDER — SODIUM BICARBONATE 650 MG PO TABS
650.0000 mg | ORAL_TABLET | Freq: Three times a day (TID) | ORAL | Status: DC
  Administered 2017-12-19 – 2017-12-23 (×13): 650 mg via ORAL
  Filled 2017-12-19 (×13): qty 1

## 2017-12-19 NOTE — Progress Notes (Signed)
PHARMACY BRIEF NOTE:   ELECTROLYTE REPLACEMENT PER LIPOSOMAL AMPHOTERICIN B PROTOCOL   Recent Labs    12/18/17 0423 12/19/17 0459  NA 130* 129*  K 4.8 4.9  CL 108 107  CO2 18* 15*  GLUCOSE 94 84  BUN 19 17  CREATININE 0.78 0.84  CALCIUM 8.8* 8.5*  MG 1.9 1.7    Assessment:  Potassium = 4.9  Mag remains just shy of goal despite frequent supplementation; 2g given earlier today per MD  SCr WNL/stable. UOP 1 mL/kg/hr   Plan, per Amphotericin B Protocol:  Give an additional 2g Mg this evening  Continue NS 1000 mL (increased from 500 mL per ID) bolus IV before and after each infusion of Ambisome   Continue daily BMET, Mag  Monitor SCr, UOP   Bernadene Personrew Daelyn Mozer, PharmD, BCPS [phone] 12/19/2017, 6:36 PM

## 2017-12-19 NOTE — Plan of Care (Signed)
Will continue to mon 

## 2017-12-19 NOTE — Progress Notes (Signed)
Late entry:  Pt had 5 beat run of Vtach at 0507. Pt not symptomatic and vital signs stable.  NP on call notified. No new orders at this time. Will continue to monitor.

## 2017-12-19 NOTE — Progress Notes (Signed)
PROGRESS NOTE    Kenneth Rivas  ZOX:[id number] DOB: Mar 25, 1961 DOA: 11/26/2017 PCP: Randall Hiss, MD   Brief Narrative:  57 y.o.male with prior history of HIV/AIDS-last CD4 count less than 10, history of cryptococcal meningitis in 2018 (not very compliant with antifungals) admitted with generalized weakness, near syncopal event. Patient was noted to have Jessia Kief prolonged QTC, and was also found to have recurrent/persistent cryptococcal meningitis and admitted to the hospitalist service. See below for further details  Assessment & Plan:   Principal Problem:   Cryptococcal meningoencephalitis (HCC) Active Problems:   HTN (hypertension)   Cachexia associated with AIDS (HCC)   Hypokalemia   Hyponatremia   Protein-calorie malnutrition, severe   AIDS (acquired immune deficiency syndrome) (HCC)   Prolonged QT interval   Near syncope   Cryptococcal meningitis (HCC)   Anemia   Abnormal echocardiogram   Cardiomyopathy [facility]): Per 2 d echo 11/28/2017   1. 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  Plan to extend therapy until no growth on CSF cx. 1. S/p Lumbar puncture on 12/04/2017 and 1/25 with cryptococcus 2. Repeat CSF cx from 1/31 pending (positive for crypto antigen).  OP 17, WBC 29, protein 55, 14 RBC's.  Yeast on gram stain.  3. Appreciate ID recs 4.  Infectious disease following.  2. Hyponatremia-  continue to monitor for now.  Getting IVF boluses prior to and after ampho.  3. Acute kidney injury- Improved.  Suspected related to acyclovir, improved with  IV fluids. ACE inhibitor and Lasix are currently  On hold. Bactrim was held for 2 days.  4. NAGMA: will start bicarb  5. HIV/AIDS- ID following. ART's have been deferred in the setting of redo induction of cryptococcal meningitis. Continue Bactrim for PCP prophylaxis.  Resume azithromycin for MAI prophylaxis. Concern for noncompliance with  meds being found under bed.  6. Near syncope- secondary to dehydration/orthostatic hypotension. QTC has normalized. 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   7. Nonischemic cardiomyopathy- He remains compensated-continue with Coreg and ACE inhibitor-appreciate cardiology input. Will need outpatient follow-up with cardiology.  8. Nonsustained Vtach: pt with 5 beat run vtach overnight.  Will continue to monitor.  Seen by cards in past during this hospitalization and thought poor candidate for invasive evaluation for HF or defibrillator therapy.  Replete mg/K prn.    9. QT prolongation- 500's on 1/10, improved to 460's on 1/13 1. Repeat EKG  10. Severe protein calorie malnutrition- continue supplements  11. Hypertension- continue coreg.  Hold Lisinopril, Lasix.  12. Anemia/leukopenia- stable, likely secondary to HIV.  13. Hyperkalemia: improved  14. NAGMA: continue to monitor, bicarb fluctuating over past few days  # Question of his capacity.  Pt not particularly engaging in care.  When discussing today, wants to know when he's going home.  Knows he's here b/c he's sick, but doesn't understand much more.  Discussed possibly going to SNF after discharge, which he notes he's open to.  Will hold off on psych c/s for now, but if he were refusing SNF, would consider c/s for capacity.  DVT prophylaxis: lovenox Code Status: full code Family Communication:none at bedside Disposition Plan: pending  Consultants:   ID  Procedures:   LP on 1/18 and 1/25 and 1/31  Antimicrobials:  Anti-infectives (From admission, onward)   Start     Dose/Rate Route Frequency Ordered Stop   12/16/17 1800  amphotericin B liposome (AMBISOME) 200 mg in dextrose 5 %  500 mL IVPB     4 mg/kg  49.6 kg 250 mL/hr over 120 Minutes Intravenous Every 24 hours 12/16/17 1417     12/15/17 1000  azithromycin (ZITHROMAX) tablet 1,200 mg     1,200 mg Oral Weekly 12/10/17 1519     12/10/17 1000   sulfamethoxazole-trimethoprim (BACTRIM DS,SEPTRA DS) 800-160 MG per tablet 1 tablet     1 tablet Oral Daily 12/06/17 1039     12/08/17 1800  azithromycin (ZITHROMAX) tablet 1,200 mg     1,200 mg Oral  Once 12/08/17 1707 12/08/17 1844   12/06/17 1800  amphotericin B liposome (AMBISOME) 150 mg in dextrose 5 % 500 mL IVPB  Status:  Discontinued     3 mg/kg  49.6 kg 250 mL/hr over 120 Minutes Intravenous Every 24 hours 12/06/17 1147 12/16/17 1418   12/04/17 1730  amphotericin B liposome (AMBISOME) 200 mg in dextrose 5 % 500 mL IVPB  Status:  Discontinued     200 mg 250 mL/hr over 120 Minutes Intravenous Every 24 hours 12/04/17 1350 12/06/17 1147   11/27/17 1800  flucytosine (ANCOBON) capsule 1,250 mg     25 mg/kg  47 kg Oral Every 6 hours 11/27/17 1615     11/27/17 1730  amphotericin B liposome (AMBISOME) 240 mg in dextrose 5 % 500 mL IVPB  Status:  Discontinued     5 mg/kg  47 kg 250 mL/hr over 120 Minutes Intravenous Every 24 hours 11/27/17 1615 12/04/17 1350   11/27/17 1000  sulfamethoxazole-trimethoprim (BACTRIM DS,SEPTRA DS) 800-160 MG per tablet 1 tablet  Status:  Discontinued     1 tablet Oral Daily 11/27/17 0839 12/06/17 1039       Subjective: Knows he's here b/c he's sick. Seems surprised when I discuss meningitis. Asking about going home. Open to going to nursing facility if we recommend.   Objective: Vitals:   12/18/17 1446 12/18/17 2233 12/19/17 0518 12/19/17 1500  BP: 107/81 138/69 135/88 (!) 140/95  Pulse: (!) 109 76 78 88  Resp: 18 18 18 16   Temp: 99.1 F (37.3 C) 98.5 F (36.9 C) 98.2 F (36.8 C) 98.4 F (36.9 C)  TempSrc: Oral Oral Oral Oral  SpO2: 100% 99% 100% 100%  Weight:      Height:        Intake/Output Summary (Last 24 hours) at 12/19/2017 1602 Last data filed at 12/19/2017 1549 Gross per 24 hour  Intake 1980 ml  Output 1800 ml  Net 180 ml   Filed Weights   12/16/17 0426 12/17/17 0443 12/18/17 0500  Weight: 50.1 kg (110 lb 7.2 oz) 49.9 kg (110 lb  0.2 oz) 48.3 kg (106 lb 7.7 oz)    Examination:  General: No acute distress. cachetic Cardiovascular: Heart sounds show Yusuf Yu regular rate, and rhythm. No gallops or rubs. No murmurs. No JVD. Lungs: Clear to auscultation bilaterally with good air movement. No rales, rhonchi or wheezes. Abdomen: Soft, nontender, nondistended with normal active bowel sounds. No masses. No hepatosplenomegaly. Neurological: Alert. Moves all extremities 4. Cranial nerves II through XII grossly intact. Skin: Warm and dry. No rashes or lesions. Extremities: No clubbing or cyanosis. No edema.    Data Reviewed: I have personally reviewed following labs and imaging studies  CBC: Recent Labs  Lab 12/13/17 0407 12/16/17 0513 12/17/17 0503 12/18/17 0423 12/19/17 0459  WBC 3.1* 3.0* 3.6* 3.6* 3.5*  HGB 8.2* 9.1* 8.9* 8.4* 8.7*  HCT 24.5* 25.8* 26.2* 25.0* 25.9*  MCV 81.1 79.1 80.1 79.9 79.9  PLT 181 169 232 237 217   Basic Metabolic Panel: Recent Labs  Lab 12/15/17 0458 12/16/17 0513 12/17/17 0503 12/18/17 0423 12/19/17 0459  NA 131* 129* 130* 130* 129*  K 4.9 5.6* 5.0 4.8 4.9  CL 107 108 108 108 107  CO2 20* 16* 18* 18* 15*  GLUCOSE 90 89 100* 94 84  BUN 16 16 18 19 17   CREATININE 0.76 0.77 0.82 0.78 0.84  CALCIUM 8.7* 8.4* 8.7* 8.8* 8.5*  MG 1.9 2.0 1.7 1.9 1.7   GFR: Estimated Creatinine Clearance: 65.5 mL/min (by C-G formula based on SCr of 0.84 mg/dL). Liver Function Tests: No results for input(s): AST, ALT, ALKPHOS, BILITOT, PROT, ALBUMIN in the last 168 hours. No results for input(s): LIPASE, AMYLASE in the last 168 hours. No results for input(s): AMMONIA in the last 168 hours. Coagulation Profile: No results for input(s): INR, PROTIME in the last 168 hours. Cardiac Enzymes: No results for input(s): CKTOTAL, CKMB, CKMBINDEX, TROPONINI in the last 168 hours. BNP (last 3 results) No results for input(s): PROBNP in the last 8760 hours. HbA1C: No results for input(s): HGBA1C in the last  72 hours. CBG: No results for input(s): GLUCAP in the last 168 hours. Lipid Profile: No results for input(s): CHOL, HDL, LDLCALC, TRIG, CHOLHDL, LDLDIRECT in the last 72 hours. Thyroid Function Tests: No results for input(s): TSH, T4TOTAL, FREET4, T3FREE, THYROIDAB in the last 72 hours. Anemia Panel: No results for input(s): VITAMINB12, FOLATE, FERRITIN, TIBC, IRON, RETICCTPCT in the last 72 hours. Sepsis Labs: No results for input(s): PROCALCITON, LATICACIDVEN in the last 168 hours.  Recent Results (from the past 240 hour(s))  CSF culture     Status: None   Collection Time: 12/11/17  2:20 PM  Result Value Ref Range Status   Specimen Description CSF  Final   Special Requests NONE  Final   Gram Stain   Final    NO ORGANISMS SEEN MODERATE WBC PRESENT, PREDOMINANTLY MONONUCLEAR Gram Stain Report Called to,Read Back By and Verified With: S.BLACKWELL RN AT 1540 ON 12/11/17 BY S.VANHOORNE    Culture   Final    RARE CRYPTOCOCCUS NEOFORMANS Results Called to: KATIE D., RN (WL/4W) AT 1530 ON 12/14/17 BY C. JESSUP, MLT CONCERNING CULTURE GROWTH. CRITICAL RESULT CALLED TO, READ BACK BY AND VERIFIED WITH: K. OBERRY, RN (WL/4W) AT 1525 ON 12/15/17 BY C. JESSUP, MLT. Performed at [facility], [address], [place], [place] [address]    Report Status 12/15/2017 FINAL  Final  Culture, fungus without smear     Status: None (Preliminary result)   Collection Time: 12/11/17  2:20 PM  Result Value Ref Range Status   Specimen Description   Final    CSF Performed at [facility], [address], [place], [place] [address]    Special Requests   Final    NONE Performed at [facility], [address] [place], [place] [address]    Culture   Final    CULTURE REINCUBATED FOR BETTER GROWTH Performed at [facility], [address] [place], [place] [address]    Report Status PENDING  Incomplete  Fungus Culture With Stain     Status: None  (Preliminary result)   Collection Time: 12/11/17  2:20 PM  Result Value Ref Range Status   Fungus Stain Final report  Final    Comment: (NOTE) Performed At: [facility] [address] [place], [place] [id number] Jolene Schimke MD [id number]  Fungus (Mycology) Culture PENDING  Incomplete   Fungal Source PENDING  Incomplete  Fungus Culture Result     Status: None   Collection Time: 12/11/17  2:20 PM  Result Value Ref Range Status   Result 1 Comment  Final    Comment: (NOTE) Fungal elements, such as arthroconidia, hyphal fragments, chlamydoconidia, observed. Performed At: [facility] [address] [place], [place] [id number] Jolene Schimke MD VQ:[id number]   Culture, fungus without smear     Status: None (Preliminary result)   Collection Time: 12/17/17  4:25 PM  Result Value Ref Range Status   Specimen Description   Final    CSF Performed at [facility], [address] [place], [place] [address]    Special Requests   Final    NONE Performed at [facility], [address] [place], [place] [address]    Culture   Final    NO FUNGUS ISOLATED AFTER 1 DAY Performed at [facility], [address] [place], [place] [address]    Report Status PENDING  Incomplete  CSF culture     Status: None (Preliminary result)   Collection Time: 12/17/17  4:56 PM  Result Value Ref Range Status   Specimen Description   Final    CSF Performed at [facility], [address] [place], [place] [address]    Special Requests   Final    NONE Performed at [facility], [name] [place], [place] [address]    Gram Stain   Final    WBC PRESENT, PREDOMINANTLY MONONUCLEAR NO ORGANISMS SEEN CYTOSPIN Gram Stain Report Called to,Read Back By and Verified With: JESSICA DEUTSCH,RN [id number] @ 1830 BY J SCOTTON ENCAPSULATED YEAST SEEN    Culture   Final    NO GROWTH 1 DAY Performed at [facility], [address], [place], [place] [address]    Report Status PENDING  Incomplete         Radiology Studies: Dg Fluoro Guide Lumbar Puncture  Result Date: 12/17/2017 CLINICAL DATA:  Cryptococcal meningoencephalitis EXAM: DIAGNOSTIC LUMBAR PUNCTURE UNDER FLUOROSCOPIC GUIDANCE FLUOROSCOPY TIME:  Fluoroscopy Time:  0 minutes, 36 seconds Radiation Exposure Index (if provided by the fluoroscopic device): 14.3 mGy Number of Acquired Spot Images: 0 PROCEDURE: I discussed the risks (including hemorrhage, infection, headache, and nerve damage, among others), benefits, and alternatives to fluoroscopically guided lumbar puncture by telephone with the patient's sister, who is guiding medical decision-making on behalf of the patient due to his altered mental status. We specifically discussed the high technical likelihood of success of the procedure. She understood and elected for the patient to undergo the procedure. Standard time-out was employed. Following sterile skin prep and local anesthetic administration consisting of 1 percent lidocaine, Ran Tullis 22 gauge spinal needle was advanced without difficulty into the thecal sac at the at the L2-3 level. Clear CSF was returned. Opening pressure was 17 cm of water. 10 cc of clear CSF was collected. The CSF return was very sluggish. The needle was subsequently removed and the skin cleansed and bandaged. No immediate complications were observed. IMPRESSION: 1. Technically successful fluoroscopically guided lumbar puncture at the L2-3 level. Opening pressure was 17 cm of water. Electronically Signed   By: Gaylyn Rong M.D.   On: 12/17/2017 16:27        Scheduled Meds: . azithromycin  1,200 mg Oral Weekly  . carvedilol  3.125 mg Oral BID WC  . dextrose  10 mL Intravenous Q24H  . dextrose  10 mL Intravenous Q24H  . docusate  25 mg Both EARS BID  . enoxaparin (LOVENOX) injection  40 mg Subcutaneous Q24H  . flucytosine  25 mg/kg Oral Q6H  . megestrol   40 mg Oral Daily  . multivitamin with minerals  1 tablet Oral Daily  . sodium bicarbonate  650 mg Oral TID  . sodium chloride  1,000 mL Intravenous Q24H  . sodium chloride  1,000 mL Intravenous Q24H  . sodium chloride flush  3 mL Intravenous Q12H  . sulfamethoxazole-trimethoprim  1 tablet Oral Daily   Continuous Infusions: . amphotericin  B  Liposome (AMBISOME) ADULT IV Stopped (12/18/17 2135)     LOS: 22 days    Time spent: over 30 min    Lacretia Nicks, MD [facility] Pager [phone]  If 7PM-7AM, please contact night-coverage [email or web address] Password [facility] 12/19/2017, 4:02 PM

## 2017-12-20 ENCOUNTER — Other Ambulatory Visit: Payer: Self-pay

## 2017-12-20 LAB — CBC
HEMATOCRIT: 24.2 % — AB (ref 39.0–52.0)
HEMOGLOBIN: 8.3 g/dL — AB (ref 13.0–17.0)
MCH: 27.3 pg (ref 26.0–34.0)
MCHC: 34.3 g/dL (ref 30.0–36.0)
MCV: 79.6 fL (ref 78.0–100.0)
Platelets: 225 10*3/uL (ref 150–400)
RBC: 3.04 MIL/uL — AB (ref 4.22–5.81)
RDW: 15.2 % (ref 11.5–15.5)
WBC: 3.5 10*3/uL — ABNORMAL LOW (ref 4.0–10.5)

## 2017-12-20 LAB — BASIC METABOLIC PANEL
Anion gap: 6 (ref 5–15)
BUN: 20 mg/dL (ref 6–20)
CHLORIDE: 108 mmol/L (ref 101–111)
CO2: 15 mmol/L — AB (ref 22–32)
Calcium: 8.5 mg/dL — ABNORMAL LOW (ref 8.9–10.3)
Creatinine, Ser: 0.75 mg/dL (ref 0.61–1.24)
GFR calc Af Amer: >60 mL/min (ref >60)
GFR calc non Af Amer: >60 mL/min (ref >60)
GLUCOSE: 102 mg/dL — AB (ref 65–99)
POTASSIUM: 4.9 mmol/L (ref 3.5–5.1)
Sodium: 129 mmol/L — ABNORMAL LOW (ref 135–145)

## 2017-12-20 LAB — MAGNESIUM: MAGNESIUM: 2.4 mg/dL (ref 1.7–2.4)

## 2017-12-20 NOTE — Plan of Care (Signed)
Cont to monitor

## 2017-12-20 NOTE — Progress Notes (Signed)
PROGRESS NOTE    Kenneth Rivas  NGE:[id number] DOB: 1961-02-01 DOA: 11/26/2017 PCP: Kenneth HissVan Dam, Cornelius N, MD   Brief Narrative:  57 y.o.male with prior history of HIV/AIDS-last CD4 count less than 10, history of cryptococcal meningitis in 2018 (not very compliant with antifungals) admitted with generalized weakness, near syncopal event. Patient was noted to have Kenneth Rivas prolonged QTC, and was also found to have recurrent/persistent cryptococcal meningitis and admitted to the hospitalist service. See below for further details  Assessment & Plan:   Principal Problem:   Cryptococcal meningoencephalitis (HCC) Active Problems:   HTN (hypertension)   Cachexia associated with AIDS (HCC)   Hypokalemia   Hyponatremia   Protein-calorie malnutrition, severe   AIDS (acquired immune deficiency syndrome) (HCC)   Prolonged QT interval   Near syncope   Cryptococcal meningitis (HCC)   Anemia   Abnormal echocardiogram   Cardiomyopathy [facility](HCC): Per 2 d echo 11/28/2017   1. 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  Plan to extend therapy until no growth on CSF cx. 1. S/p Lumbar puncture on 12/04/2017 and 1/25 with cryptococcus 2. Repeat CSF cx from 1/31 pending (positive for crypto antigen).  OP 17, WBC 29, protein 55, 14 RBC's.  Yeast on gram stain.  3. Appreciate ID recs 4.  Infectious disease following.  2. Hyponatremia-  continue to monitor for now.  Getting IVF boluses prior to and after ampho.  3. Acute kidney injury- Improved.  Suspected related to acyclovir, improved with  IV fluids. ACE inhibitor and Lasix are currently  On hold. Bactrim was held for 2 days.  4. NAGMA: cont bicarb  5. HIV/AIDS- ID following. ART's have been deferred in the setting of redo induction of cryptococcal meningitis. Continue Bactrim for PCP prophylaxis.  Resume azithromycin for MAI prophylaxis. Concern for noncompliance with meds  being found under bed.  6. Near syncope- secondary to dehydration/orthostatic hypotension. QTC has normalized. 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   7. Nonischemic cardiomyopathy- He remains compensated-continue with Coreg and ACE inhibitor-appreciate cardiology input. Will need outpatient follow-up with cardiology.  8. Nonsustained Vtach: pt with 5 beat run vtach overnight 2/1-2/2.  Will continue to monitor.  Seen by cards in past during this hospitalization and thought poor candidate for invasive evaluation for HF or defibrillator therapy.  Replete mg/K prn.    9. QT prolongation- 500's on 1/10, improved to 460's on 1/13 1. Repeat EKG on 2/3 with QTc 437  10. Severe protein calorie malnutrition- continue supplements  11. Hypertension- continue coreg.  Hold Lisinopril, Lasix.  12. Anemia/leukopenia- stable, likely secondary to HIV.  13. Hyperkalemia: improved  14. NAGMA: start bicarb  # Question of his capacity.  Pt not particularly engaging in care.  Knows he's here b/c he's sick, but doesn't seem to understand much more.  Discussed possibly going to SNF after discharge, which he notes he's open to.  Will hold off on psych c/s for now, but if he were refusing SNF, would consider c/s for capacity.  DVT prophylaxis: lovenox Code Status: full code Family Communication:none at bedside Disposition Plan: pending  Consultants:   ID  Procedures:   LP on 1/18 and 1/25 and 1/31  Antimicrobials:  Anti-infectives (From admission, onward)   Start     Dose/Rate Route Frequency Ordered Stop   12/16/17 1800  amphotericin B liposome (AMBISOME) 200 mg in dextrose 5 % 500 mL IVPB     4 mg/kg  49.6 kg 250 mL/hr over 120 Minutes Intravenous Every 24 hours 12/16/17 1417     12/15/17 1000  azithromycin (ZITHROMAX) tablet 1,200 mg     1,200 mg Oral Weekly 12/10/17 1519     12/10/17 1000  sulfamethoxazole-trimethoprim (BACTRIM DS,SEPTRA DS) 800-160 MG per tablet 1  tablet     1 tablet Oral Daily 12/06/17 1039     12/08/17 1800  azithromycin (ZITHROMAX) tablet 1,200 mg     1,200 mg Oral  Once 12/08/17 1707 12/08/17 1844   12/06/17 1800  amphotericin B liposome (AMBISOME) 150 mg in dextrose 5 % 500 mL IVPB  Status:  Discontinued     3 mg/kg  49.6 kg 250 mL/hr over 120 Minutes Intravenous Every 24 hours 12/06/17 1147 12/16/17 1418   12/04/17 1730  amphotericin B liposome (AMBISOME) 200 mg in dextrose 5 % 500 mL IVPB  Status:  Discontinued     200 mg 250 mL/hr over 120 Minutes Intravenous Every 24 hours 12/04/17 1350 12/06/17 1147   11/27/17 1800  flucytosine (ANCOBON) capsule 1,250 mg     25 mg/kg  47 kg Oral Every 6 hours 11/27/17 1615     11/27/17 1730  amphotericin B liposome (AMBISOME) 240 mg in dextrose 5 % 500 mL IVPB  Status:  Discontinued     5 mg/kg  47 kg 250 mL/hr over 120 Minutes Intravenous Every 24 hours 11/27/17 1615 12/04/17 1350   11/27/17 1000  sulfamethoxazole-trimethoprim (BACTRIM DS,SEPTRA DS) 800-160 MG per tablet 1 tablet  Status:  Discontinued     1 tablet Oral Daily 11/27/17 0839 12/06/17 1039       Subjective: No complaitns.   Objective: Vitals:   12/19/17 1500 12/19/17 2155 12/20/17 0519 12/20/17 1425  BP: (!) 140/95 136/79 139/90 (!) 114/54  Pulse: 88 81 (!) 109 94  Resp: 16 20 20 20   Temp: 98.4 F (36.9 C) 98.5 F (36.9 C) 99.5 F (37.5 C) 98.6 F (37 C)  TempSrc: Oral Oral Oral Oral  SpO2: 100% 100% 98% 99%  Weight:      Height:        Intake/Output Summary (Last 24 hours) at 12/20/2017 1738 Last data filed at 12/20/2017 1719 Gross per 24 hour  Intake 1980 ml  Output 2450 ml  Net -470 ml   Filed Weights   12/16/17 0426 12/17/17 0443 12/18/17 0500  Weight: 50.1 kg (110 lb 7.2 oz) 49.9 kg (110 lb 0.2 oz) 48.3 kg (106 lb 7.7 oz)    Examination:  General: No acute distress. Cardiovascular: Heart sounds show [name] regular rate, and rhythm. No gallops or rubs. No murmurs. No JVD. Lungs: Clear to  auscultation bilaterally with good air movement. No rales, rhonchi or wheezes. Abdomen: Soft, nontender, nondistended with normal active bowel sounds. No masses. No hepatosplenomegaly. Neurological: Aler. Moves all extremities 4. Cranial nerves II through XII grossly intact. Skin: Warm and dry. No rashes or lesions. Extremities: No clubbing or cyanosis. No edema.   Data Reviewed: I have personally reviewed following labs and imaging studies  CBC: Recent Labs  Lab 12/16/17 0513 12/17/17 0503 12/18/17 0423 12/19/17 0459 12/20/17 0454  WBC 3.0* 3.6* 3.6* 3.5* 3.5*  HGB 9.1* 8.9* 8.4* 8.7* 8.3*  HCT 25.8* 26.2* 25.0* 25.9* 24.2*  MCV 79.1 80.1 79.9 79.9 79.6  PLT 169 232 237 217 225   Basic Metabolic Panel: Recent Labs  Lab 12/16/17 0513 12/17/17 0503 12/18/17 0423 12/19/17 0459 12/20/17 0454  NA 129* 130* 130* 129* 129*  K  5.6* 5.0 4.8 4.9 4.9  CL 108 108 108 107 108  CO2 16* 18* 18* 15* 15*  GLUCOSE 89 100* 94 84 102*  BUN 16 18 19 17 20   CREATININE 0.77 0.82 0.78 0.84 0.75  CALCIUM 8.4* 8.7* 8.8* 8.5* 8.5*  MG 2.0 1.7 1.9 1.7 2.4   GFR: Estimated Creatinine Clearance: 68.8 mL/min (by C-G formula based on SCr of 0.75 mg/dL). Liver Function Tests: No results for input(s): AST, ALT, ALKPHOS, BILITOT, PROT, ALBUMIN in the last 168 hours. No results for input(s): LIPASE, AMYLASE in the last 168 hours. No results for input(s): AMMONIA in the last 168 hours. Coagulation Profile: No results for input(s): INR, PROTIME in the last 168 hours. Cardiac Enzymes: No results for input(s): CKTOTAL, CKMB, CKMBINDEX, TROPONINI in the last 168 hours. BNP (last 3 results) No results for input(s): PROBNP in the last 8760 hours. HbA1C: No results for input(s): HGBA1C in the last 72 hours. CBG: No results for input(s): GLUCAP in the last 168 hours. Lipid Profile: No results for input(s): CHOL, HDL, LDLCALC, TRIG, CHOLHDL, LDLDIRECT in the last 72 hours. Thyroid Function Tests: No  results for input(s): TSH, T4TOTAL, FREET4, T3FREE, THYROIDAB in the last 72 hours. Anemia Panel: No results for input(s): VITAMINB12, FOLATE, FERRITIN, TIBC, IRON, RETICCTPCT in the last 72 hours. Sepsis Labs: No results for input(s): PROCALCITON, LATICACIDVEN in the last 168 hours.  Recent Results (from the past 240 hour(s))  CSF culture     Status: None   Collection Time: 12/11/17  2:20 PM  Result Value Ref Range Status   Specimen Description CSF  Final   Special Requests NONE  Final   Gram Stain   Final    NO ORGANISMS SEEN MODERATE WBC PRESENT, PREDOMINANTLY MONONUCLEAR Gram Stain Report Called to,Read Back By and Verified With: S.BLACKWELL RN AT 1540 ON 12/11/17 BY S.VANHOORNE    Culture   Final    RARE CRYPTOCOCCUS NEOFORMANS Results Called to: KATIE D., RN (WL/4W) AT 1530 ON 12/14/17 BY C. JESSUP, MLT CONCERNING CULTURE GROWTH. CRITICAL RESULT CALLED TO, READ BACK BY AND VERIFIED WITH: K. OBERRY, RN (WL/4W) AT 1525 ON 12/15/17 BY C. JESSUP, MLT. Performed at [facility], [address], [place], [place] [address]    Report Status 12/15/2017 FINAL  Final  Culture, fungus without smear     Status: Abnormal (Preliminary result)   Collection Time: 12/11/17  2:20 PM  Result Value Ref Range Status   Specimen Description   Final    CSF Performed at [facility], [address], [place], [place] [address]    Special Requests   Final    NONE Performed at [facility], [address] [place], [place] [address]    Culture CRYPTOCOCCUS NEOFORMANS (Shirle Provencal)  Final   Report Status PENDING  Incomplete  Fungus Culture With Stain     Status: None (Preliminary result)   Collection Time: 12/11/17  2:20 PM  Result Value Ref Range Status   Fungus Stain Final report  Final    Comment: (NOTE) Performed At: [facility] [address] [place], [place] [id number] Jolene Schimke MD MV:[id number]    Fungus (Mycology) Culture PENDING  Incomplete     Fungal Source PENDING  Incomplete  Fungus Culture Result     Status: None   Collection Time: 12/11/17  2:20 PM  Result Value Ref Range Status   Result 1 Comment  Final    Comment: (NOTE) Fungal elements, such as  arthroconidia, hyphal fragments, chlamydoconidia, observed. Performed At: [facility] [address] [place], [place] [id number] Jolene Schimke MD NF:[id number]   Culture, fungus without smear     Status: None (Preliminary result)   Collection Time: 12/17/17  4:25 PM  Result Value Ref Range Status   Specimen Description   Final    CSF Performed at [facility], [address] [place], [place] [address]    Special Requests   Final    NONE Performed at [facility], [address] [place], [place] [address]    Culture   Final    NO FUNGUS ISOLATED AFTER 2 DAYS Performed at [facility], [address], [place], [place] [address]    Report Status PENDING  Incomplete  CSF culture     Status: None (Preliminary result)   Collection Time: 12/17/17  4:56 PM  Result Value Ref Range Status   Specimen Description   Final    CSF Performed at [facility], [address], [place], [place] [address]    Special Requests   Final    NONE Performed at [facility], [address] [place], [place] [address]    Gram Stain   Final    WBC PRESENT, PREDOMINANTLY MONONUCLEAR NO ORGANISMS SEEN CYTOSPIN Gram Stain Report Called to,Read Back By and Verified With: JESSICA DEUTSCH,RN [id number] @ 1830 BY J SCOTTON ENCAPSULATED YEAST SEEN    Culture   Final    NO GROWTH 2 DAYS Performed at [facility], [address] [place], [place] [address]    Report Status PENDING  Incomplete         Radiology Studies: No results found.      Scheduled Meds: . azithromycin  1,200 mg Oral Weekly  . carvedilol  3.125 mg Oral BID WC  . dextrose  10 mL Intravenous Q24H  . dextrose  10 mL  Intravenous Q24H  . docusate  25 mg Both EARS BID  . enoxaparin (LOVENOX) injection  40 mg Subcutaneous Q24H  . flucytosine  25 mg/kg Oral Q6H  . megestrol  40 mg Oral Daily  . multivitamin with minerals  1 tablet Oral Daily  . sodium bicarbonate  650 mg Oral TID  . sodium chloride  1,000 mL Intravenous Q24H  . sodium chloride  1,000 mL Intravenous Q24H  . sodium chloride flush  3 mL Intravenous Q12H  . sulfamethoxazole-trimethoprim  1 tablet Oral Daily   Continuous Infusions: . amphotericin  B  Liposome (AMBISOME) ADULT IV Stopped (12/19/17 2300)     LOS: 23 days    Time spent: over 30 min    Lacretia Nicks, MD [facility] Pager [phone]  If 7PM-7AM, please contact night-coverage [email or web address] Password [facility] 12/20/2017, 5:38 PM

## 2017-12-20 NOTE — Progress Notes (Signed)
PHARMACY BRIEF NOTE:   ELECTROLYTE REPLACEMENT PER LIPOSOMAL AMPHOTERICIN B PROTOCOL   Recent Labs    12/19/17 0459 12/20/17 0454  NA 129* 129*  K 4.9 4.9  CL 107 108  CO2 15* 15*  GLUCOSE 84 102*  BUN 17 20  CREATININE 0.84 0.75  CALCIUM 8.5* 8.5*  MG 1.7 2.4    Assessment:  Potassium WNL  Mag now > 2 after 4g IV yesterday  SCr WNL/stable. UOP > 2 mL/kg/hr   Plan, per Amphotericin B Protocol:  No additional electrolytes today  Continue NS 1000 mL (increased from 500 mL per ID) bolus IV before and after each infusion of Ambisome   Continue daily BMET, Mag  Monitor SCr, UOP   Bernadene Personrew Camielle Sizer, PharmD, BCPS [phone] 12/20/2017, 3:25 PM

## 2017-12-21 DIAGNOSIS — R451 Restlessness and agitation: Secondary | ICD-10-CM

## 2017-12-21 LAB — CBC
HEMATOCRIT: 24.5 % — AB (ref 39.0–52.0)
Hemoglobin: 8.5 g/dL — ABNORMAL LOW (ref 13.0–17.0)
MCH: 27.6 pg (ref 26.0–34.0)
MCHC: 34.7 g/dL (ref 30.0–36.0)
MCV: 79.5 fL (ref 78.0–100.0)
Platelets: 230 10*3/uL (ref 150–400)
RBC: 3.08 MIL/uL — ABNORMAL LOW (ref 4.22–5.81)
RDW: 15.4 % (ref 11.5–15.5)
WBC: 3.6 10*3/uL — AB (ref 4.0–10.5)

## 2017-12-21 LAB — BASIC METABOLIC PANEL
ANION GAP: 4 — AB (ref 5–15)
BUN: 19 mg/dL (ref 6–20)
CALCIUM: 8.5 mg/dL — AB (ref 8.9–10.3)
CO2: 15 mmol/L — AB (ref 22–32)
Chloride: 110 mmol/L (ref 101–111)
Creatinine, Ser: 0.83 mg/dL (ref 0.61–1.24)
GFR calc Af Amer: >60 mL/min (ref >60)
GFR calc non Af Amer: >60 mL/min (ref >60)
GLUCOSE: 106 mg/dL — AB (ref 65–99)
Potassium: 4.8 mmol/L (ref 3.5–5.1)
Sodium: 129 mmol/L — ABNORMAL LOW (ref 135–145)

## 2017-12-21 LAB — CSF CULTURE W GRAM STAIN: Culture: NO GROWTH

## 2017-12-21 LAB — MAGNESIUM: Magnesium: 1.5 mg/dL — ABNORMAL LOW (ref 1.7–2.4)

## 2017-12-21 MED ORDER — MAGNESIUM SULFATE IN D5W 1-5 GM/100ML-% IV SOLN
1.0000 g | Freq: Once | INTRAVENOUS | Status: AC
  Administered 2017-12-21: 1 g via INTRAVENOUS
  Filled 2017-12-21: qty 100

## 2017-12-21 NOTE — Progress Notes (Signed)
Pt is close to discharge. Plan to discharge to SNF.

## 2017-12-21 NOTE — Progress Notes (Signed)
Physical Therapy Treatment Patient Details Name: Kenneth Rivas MRN: [id number] DOB: 09/05/1961 Today's Date: 12/21/2017    History of Present Illness 57 yo male admitted with weakness, near syncopal episode.  Dx:  cryptococcal meningoencephalitis.  Hx of HIV/AIDS, Hep B, drug abuse, noncompliance, cachexia, anemia.     PT Comments    Pt assisted OOB and had BM [facility] bed pad [facility]  Pt then only wished to ambulate to recliner after changing gown and pericare.  Pt had brief rest break and requested back to bed (declined further ambulation and did not want to remain in recliner).  Continue to recommend SNF upon d/c.  Follow Up Recommendations  SNF     Equipment Recommendations  Wheelchair cushion (measurements PT);Wheelchair (measurements PT)    Recommendations for Other Services       Precautions / Restrictions Precautions Precautions: Fall Precaution Comments: unsteady gait Restrictions Weight Bearing Restrictions: No    Mobility  Bed Mobility Overal bed mobility: Needs Assistance Bed Mobility: Supine to Sit;Sit to Supine     Supine to sit: Supervision;HOB elevated Sit to supine: Supervision;HOB elevated   General bed mobility comments: supervision for lines  Transfers Overall transfer level: Needs assistance Equipment used: Rolling walker (2 wheeled) Transfers: Sit to/from Stand Sit to Stand: Min assist Stand pivot transfers: Min assist       General transfer comment: assist for rising, steadying; verbal cues for safety, RW positioning, UE placement, cues to fully turn and reach for bed/chair prior to sitting; stand pivot to [facility] (pt with BM on bed pad)  Ambulation/Gait             General Gait Details: unable to ambulate more than getting [facility] and recliner chair, had BM in bed and [facility]; fatigued after standing for several minutes for cleaning and RN changing bed linens   Stairs            Wheelchair Mobility    Modified  Rankin (Stroke Patients Only)       Balance Overall balance assessment: Needs assistance Sitting-balance support: Feet supported;No upper extremity supported Sitting balance-Leahy Scale: Good     Standing balance support: Bilateral upper extremity supported Standing balance-Leahy Scale: Poor Standing balance comment: UE support for balance                            Cognition Arousal/Alertness: Awake/alert Behavior During Therapy: WFL for tasks assessed/performed Overall Cognitive Status: No family/caregiver present to determine baseline cognitive functioning Area of Impairment: Problem solving;Safety/judgement                     Memory: Decreased short-term memory   Safety/Judgement: Decreased awareness of safety;Decreased awareness of deficits   Problem Solving: Requires verbal cues General Comments: HOH, requires many cues for safety      Exercises      General Comments        Pertinent Vitals/Pain Pain Assessment: No/denies pain    Home Living                      Prior Function            PT Goals (current goals can now be found in the care plan section) Progress towards PT goals: Progressing toward goals    Frequency    Min 3X/week      PT Plan Current plan remains appropriate    Co-evaluation  AM-PAC PT "6 Clicks" Daily Activity  Outcome Measure  Difficulty turning over in bed (including adjusting bedclothes, sheets and blankets)?: A Little Difficulty moving from lying on back to sitting on the side of the bed? : A Little Difficulty sitting down on and standing up from a chair with arms (e.g., wheelchair, bedside commode, etc,.)?: Unable Help needed moving to and from a bed to chair (including a wheelchair)?: A Little Help needed walking in hospital room?: A Little Help needed climbing 3-5 steps with a railing? : A Little 6 Click Score: 16    End of Session Equipment Utilized During  Treatment: Gait belt Activity Tolerance: Patient limited by fatigue Patient left: in bed;with nursing/sitter in room;with call bell/phone within reach Nurse Communication: Other (comment)(RN in room during treatment, assisted with linen change and cleaning room) PT Visit Diagnosis: Muscle weakness (generalized) (M62.81);Difficulty in walking, not elsewhere classified (R26.2)     Time: 1610-9604 PT Time Calculation (min) (ACUTE ONLY): 25 min  Charges:  $Therapeutic Activity: 8-22 mins                    G Codes:       Zenovia Jarred, PT, DPT 12/21/2017 Pager: [phone]  Maida Sale E 12/21/2017, 4:45 PM

## 2017-12-21 NOTE — Progress Notes (Signed)
PROGRESS NOTE    Kenneth Rivas  ZOX:[id number] DOB: [birth year] DOA: 11/26/2017 PCP: Randall Hiss, MD   Brief Narrative:  57 y.o.male with prior history of HIV/AIDS-last CD4 count less than 10, history of cryptococcal meningitis in 2018 (not very compliant with antifungals) admitted with generalized weakness, near syncopal event. Patient was noted to have a prolonged QTC, and was also found to have recurrent/persistent cryptococcal meningitis and admitted to the hospitalist service. See below for further details  Assessment & Plan:   Principal Problem:   Cryptococcal meningoencephalitis (HCC) Active Problems:   HTN (hypertension)   Cachexia associated with AIDS (HCC)   Hypokalemia   Hyponatremia   Protein-calorie malnutrition, severe   AIDS (acquired immune deficiency syndrome) (HCC)   Prolonged QT interval   Near syncope   Cryptococcal meningitis (HCC)   Anemia   Abnormal echocardiogram   Cardiomyopathy [facility]): Per 2 d echo 11/28/2017   1. 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  Plan to extend therapy until no growth on CSF cx. 1. S/p Lumbar puncture on 12/04/2017 and 1/25 with cryptococcus 2. Repeat CSF cx from 1/31 pending (positive for crypto antigen).  OP 17, WBC 29, protein 55, 14 RBC's.  Yeast on gram stain.  3. Appreciate ID recs 4.  Infectious disease following.  2. Hyponatremia-  continue to monitor for now.  Getting IVF boluses prior to and after ampho.  3. Acute kidney injury- Improved.  Suspected related to acyclovir, improved with  IV fluids. ACE inhibitor and Lasix are currently  On hold. Bactrim was held for 2 days.  4. NAGMA: cont bicarb, may need to increase dose.   5. HIV/AIDS- ID following. ART's have been deferred in the setting of redo induction of cryptococcal meningitis. Continue Bactrim for PCP prophylaxis.  Resume azithromycin for MAI prophylaxis. Concern  for noncompliance with meds being found under bed.  6. Near syncope- secondary to dehydration/orthostatic hypotension. QTC has normalized. 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   7. Nonischemic cardiomyopathy- He remains compensated-continue with Coreg and ACE inhibitor-appreciate cardiology input. Will need outpatient follow-up with cardiology.  8. Nonsustained Vtach: pt with 5 beat run vtach overnight 2/1-2/2.  Will continue to monitor.  Seen by cards in past during this hospitalization and thought poor candidate for invasive evaluation for HF or defibrillator therapy.  Replete mg/K prn.    9. QT prolongation- 500's on 1/10, improved to 460's on 1/13 1. Repeat EKG on 2/3 with QTc 437  10. Severe protein calorie malnutrition- continue supplements  11. Hypertension- continue coreg.  Hold Lisinopril, Lasix.  12. Anemia/leukopenia- stable, likely secondary to HIV.  13. Hyperkalemia: improved  14. NAGMA: start bicarb  # Question of his capacity.  Pt not particularly engaging in care.  Knows he's here b/c he's sick, but doesn't seem to understand much more.  We've discussed going to SNF after discharge, which he notes he's open to.  Will hold off on psych c/s for now, but if he were refusing SNF, would consider c/s for capacity.  DVT prophylaxis: lovenox Code Status: full code Family Communication:none at bedside Disposition Plan: pending  Consultants:   ID  Procedures:   LP on 1/18 and 1/25 and 1/31  Antimicrobials:  Anti-infectives (From admission, onward)   Start     Dose/Rate Route Frequency Ordered Stop   12/16/17 1800  amphotericin B liposome (AMBISOME) 200 mg in dextrose 5 % 500 mL IVPB  4 mg/kg  49.6 kg 250 mL/hr over 120 Minutes Intravenous Every 24 hours 12/16/17 1417     12/15/17 1000  azithromycin (ZITHROMAX) tablet 1,200 mg     1,200 mg Oral Weekly 12/10/17 1519     12/10/17 1000  sulfamethoxazole-trimethoprim (BACTRIM DS,SEPTRA DS)  800-160 MG per tablet 1 tablet     1 tablet Oral Daily 12/06/17 1039     12/08/17 1800  azithromycin (ZITHROMAX) tablet 1,200 mg     1,200 mg Oral  Once 12/08/17 1707 12/08/17 1844   12/06/17 1800  amphotericin B liposome (AMBISOME) 150 mg in dextrose 5 % 500 mL IVPB  Status:  Discontinued     3 mg/kg  49.6 kg 250 mL/hr over 120 Minutes Intravenous Every 24 hours 12/06/17 1147 12/16/17 1418   12/04/17 1730  amphotericin B liposome (AMBISOME) 200 mg in dextrose 5 % 500 mL IVPB  Status:  Discontinued     200 mg 250 mL/hr over 120 Minutes Intravenous Every 24 hours 12/04/17 1350 12/06/17 1147   11/27/17 1800  flucytosine (ANCOBON) capsule 1,250 mg     25 mg/kg  47 kg Oral Every 6 hours 11/27/17 1615     11/27/17 1730  amphotericin B liposome (AMBISOME) 240 mg in dextrose 5 % 500 mL IVPB  Status:  Discontinued     5 mg/kg  47 kg 250 mL/hr over 120 Minutes Intravenous Every 24 hours 11/27/17 1615 12/04/17 1350   11/27/17 1000  sulfamethoxazole-trimethoprim (BACTRIM DS,SEPTRA DS) 800-160 MG per tablet 1 tablet  Status:  Discontinued     1 tablet Oral Daily 11/27/17 0839 12/06/17 1039       Subjective: No complaints.    Objective: Vitals:   12/20/17 0519 12/20/17 1425 12/20/17 2029 12/21/17 0451  BP: 139/90 (!) 114/54 (!) 110/56 111/67  Pulse: (!) 109 94 92 97  Resp: 20 20 20 20   Temp: 99.5 F (37.5 C) 98.6 F (37 C) 98.7 F (37.1 C) 98.5 F (36.9 C)  TempSrc: Oral Oral Oral Oral  SpO2: 98% 99% 99% 98%  Weight:    52.8 kg (116 lb 6.4 oz)  Height:        Intake/Output Summary (Last 24 hours) at 12/21/2017 1030 Last data filed at 12/21/2017 0200 Gross per 24 hour  Intake 2980 ml  Output 1000 ml  Net 1980 ml   Filed Weights   12/17/17 0443 12/18/17 0500 12/21/17 0451  Weight: 49.9 kg (110 lb 0.2 oz) 48.3 kg (106 lb 7.7 oz) 52.8 kg (116 lb 6.4 oz)    Examination:  General: No acute distress. Cardiovascular: Heart sounds show a regular rate, and rhythm. No gallops or rubs.  No murmurs. No JVD. Lungs: Clear to auscultation bilaterally with good air movement. No rales, rhonchi or wheezes. Abdomen: Soft, nontender, nondistended with normal active bowel sounds. No masses. No hepatosplenomegaly. Neurological: Alert and oriented 3. Moves all extremities 4. Cranial nerves II through XII grossly intact. Skin: Warm and dry. No rashes or lesions. Extremities: No clubbing or cyanosis. No edema.  Data Reviewed: I have personally reviewed following labs and imaging studies  CBC: Recent Labs  Lab 12/17/17 0503 12/18/17 0423 12/19/17 0459 12/20/17 0454 12/21/17 0541  WBC 3.6* 3.6* 3.5* 3.5* 3.6*  HGB 8.9* 8.4* 8.7* 8.3* 8.5*  HCT 26.2* 25.0* 25.9* 24.2* 24.5*  MCV 80.1 79.9 79.9 79.6 79.5  PLT 232 237 217 225 230   Basic Metabolic Panel: Recent Labs  Lab 12/17/17 0503 12/18/17 0423 12/19/17 0459 12/20/17 0454  12/21/17 0541  NA 130* 130* 129* 129* 129*  K 5.0 4.8 4.9 4.9 4.8  CL 108 108 107 108 110  CO2 18* 18* 15* 15* 15*  GLUCOSE 100* 94 84 102* 106*  BUN 18 19 17 20 19   CREATININE 0.82 0.78 0.84 0.75 0.83  CALCIUM 8.7* 8.8* 8.5* 8.5* 8.5*  MG 1.7 1.9 1.7 2.4 1.5*   GFR: Estimated Creatinine Clearance: 72.4 mL/min (by C-G formula based on SCr of 0.83 mg/dL). Liver Function Tests: No results for input(s): AST, ALT, ALKPHOS, BILITOT, PROT, ALBUMIN in the last 168 hours. No results for input(s): LIPASE, AMYLASE in the last 168 hours. No results for input(s): AMMONIA in the last 168 hours. Coagulation Profile: No results for input(s): INR, PROTIME in the last 168 hours. Cardiac Enzymes: No results for input(s): CKTOTAL, CKMB, CKMBINDEX, TROPONINI in the last 168 hours. BNP (last 3 results) No results for input(s): PROBNP in the last 8760 hours. HbA1C: No results for input(s): HGBA1C in the last 72 hours. CBG: No results for input(s): GLUCAP in the last 168 hours. Lipid Profile: No results for input(s): CHOL, HDL, LDLCALC, TRIG, CHOLHDL,  LDLDIRECT in the last 72 hours. Thyroid Function Tests: No results for input(s): TSH, T4TOTAL, FREET4, T3FREE, THYROIDAB in the last 72 hours. Anemia Panel: No results for input(s): VITAMINB12, FOLATE, FERRITIN, TIBC, IRON, RETICCTPCT in the last 72 hours. Sepsis Labs: No results for input(s): PROCALCITON, LATICACIDVEN in the last 168 hours.  Recent Results (from the past 240 hour(s))  CSF culture     Status: None   Collection Time: 12/11/17  2:20 PM  Result Value Ref Range Status   Specimen Description CSF  Final   Special Requests NONE  Final   Gram Stain   Final    NO ORGANISMS SEEN MODERATE WBC PRESENT, PREDOMINANTLY MONONUCLEAR Gram Stain Report Called to,Read Back By and Verified With: S.BLACKWELL RN AT 1540 ON 12/11/17 BY S.VANHOORNE    Culture   Final    RARE CRYPTOCOCCUS NEOFORMANS Results Called to: KATIE D., RN (WL/4W) AT 1530 ON 12/14/17 BY C. JESSUP, MLT CONCERNING CULTURE GROWTH. CRITICAL RESULT CALLED TO, READ BACK BY AND VERIFIED WITH: K. OBERRY, RN (WL/4W) AT 1525 ON 12/15/17 BY C. JESSUP, MLT. Performed at [facility], [address], [place], [place] [address]    Report Status 12/15/2017 FINAL  Final  Culture, fungus without smear     Status: Abnormal (Preliminary result)   Collection Time: 12/11/17  2:20 PM  Result Value Ref Range Status   Specimen Description   Final    CSF Performed at [facility], [address] [place], [place] [address]    Special Requests   Final    NONE Performed at [facility], [address], [place], [place] [address]    Culture CRYPTOCOCCUS NEOFORMANS (A)  Final   Report Status PENDING  Incomplete  Fungus Culture With Stain     Status: None (Preliminary result)   Collection Time: 12/11/17  2:20 PM  Result Value Ref Range Status   Fungus Stain Final report  Final    Comment: (NOTE) Performed At: [facility] [address] [place], [place] [id number] Jolene Schimke MD  OZ:[id number]    Fungus (Mycology) Culture PENDING  Incomplete   Fungal Source PENDING  Incomplete  Fungus Culture Result     Status: None   Collection Time: 12/11/17  2:20 PM  Result Value Ref Range Status   Result 1 Comment  Final    Comment: (NOTE) Fungal elements, such as arthroconidia, hyphal fragments, chlamydoconidia, observed. Performed At: [facility] [address] [place], [place] [id number] Jolene SchimkeNagendra Sanjai MD JY:[phone]   Culture, fungus without smear     Status: None (Preliminary result)   Collection Time: 12/17/17  4:25 PM  Result Value Ref Range Status   Specimen Description   Final    CSF Performed at [facility], [address] [place], [place] [address]    Special Requests   Final    NONE Performed at [facility], [address] [place], [place] [address]    Culture   Final    NO FUNGUS ISOLATED AFTER 2 DAYS Performed at [facility], [address] [place], [place] [address]    Report Status PENDING  Incomplete  CSF culture     Status: None (Preliminary result)   Collection Time: 12/17/17  4:56 PM  Result Value Ref Range Status   Specimen Description   Final    CSF Performed at [facility], [address] [place], [place] [address]    Special Requests   Final    NONE Performed at [facility], [address] [place], [place] [address]    Gram Stain   Final    WBC PRESENT, PREDOMINANTLY MONONUCLEAR NO ORGANISMS SEEN CYTOSPIN Gram Stain Report Called to,Read Back By and Verified With: JESSICA DEUTSCH,RN [id number] @ 1830 BY J SCOTTON ENCAPSULATED YEAST SEEN    Culture   Final    NO GROWTH 2 DAYS Performed at [facility], [address] [place], [place] [address]    Report Status PENDING  Incomplete         Radiology Studies: No results found.      Scheduled Meds: . azithromycin  1,200 mg Oral Weekly  . carvedilol  3.125 mg Oral  BID WC  . dextrose  10 mL Intravenous Q24H  . dextrose  10 mL Intravenous Q24H  . docusate  25 mg Both EARS BID  . enoxaparin (LOVENOX) injection  40 mg Subcutaneous Q24H  . flucytosine  25 mg/kg Oral Q6H  . megestrol  40 mg Oral Daily  . multivitamin with minerals  1 tablet Oral Daily  . sodium bicarbonate  650 mg Oral TID  . sodium chloride  1,000 mL Intravenous Q24H  . sodium chloride  1,000 mL Intravenous Q24H  . sodium chloride flush  3 mL Intravenous Q12H  . sulfamethoxazole-trimethoprim  1 tablet Oral Daily   Continuous Infusions: . amphotericin  B  Liposome (AMBISOME) ADULT IV Stopped (12/20/17 2130)  . magnesium sulfate 1 - 4 g bolus IVPB    . magnesium sulfate 1 - 4 g bolus IVPB       LOS: 24 days    Time spent: over 30 min    Lacretia Nicksaldwell Powell, MD [facility] Pager [phone]  If 7PM-7AM, please contact night-coverage [email or web address] Password [email or web address] 12/21/2017, 10:30 AM

## 2017-12-21 NOTE — Progress Notes (Signed)
Patient ID: Kenneth Rivas, male   DOB: 08/23/1961, 58 y.o.   MRN: [id number]          [facility]  Date of Admission:  11/26/2017           Day 25 amphotericin        Day 25 flucytosine ASSESSMENT: His repeat spinal fluid culture on 12/17/2017 remains negative.  If it remains negative overnight I will change him to fluconazole 400 mg for consolidation therapy.  I plan on waiting at least 4 more weeks before considering starting antiretroviral therapy in order to try to avoid immune reconstitution inflammatory syndrome.  PLAN: 1. Change amphotericin and flucytosine to fluconazole if CSF cultures remain negative 2. Continue prophylactic doses of trimethoprim sulfamethoxazole and azithromycin 3. Agree with discharge to skilled nursing facility  Principal Problem:   Cryptococcal meningoencephalitis (HCC) Active Problems:   AIDS (acquired immune deficiency syndrome) (HCC)   HTN (hypertension)   Cachexia associated with AIDS (HCC)   Hypokalemia   Hyponatremia   Protein-calorie malnutrition, severe   Prolonged QT interval   Near syncope   Cryptococcal meningitis (HCC)   Anemia   Abnormal echocardiogram   Cardiomyopathy [facility]): Per 2 d echo 11/28/2017   Scheduled Meds: . azithromycin  1,200 mg Oral Weekly  . carvedilol  3.125 mg Oral BID WC  . dextrose  10 mL Intravenous Q24H  . dextrose  10 mL Intravenous Q24H  . docusate  25 mg Both EARS BID  . enoxaparin (LOVENOX) injection  40 mg Subcutaneous Q24H  . flucytosine  25 mg/kg Oral Q6H  . megestrol  40 mg Oral Daily  . multivitamin with minerals  1 tablet Oral Daily  . sodium bicarbonate  650 mg Oral TID  . sodium chloride  1,000 mL Intravenous Q24H  . sodium chloride  1,000 mL Intravenous Q24H  . sodium chloride flush  3 mL Intravenous Q12H  . sulfamethoxazole-trimethoprim  1 tablet Oral Daily   Continuous Infusions: . amphotericin  B  Liposome (AMBISOME) ADULT IV Stopped (12/20/17 2130)   PRN  Meds:.acetaminophen **OR** [DISCONTINUED] acetaminophen, hydrALAZINE, meperidine (DEMEROL) injection   SUBJECTIVE: "I'm fine.  I just want to go home".  Review of Systems: Review of Systems  Unable to perform ROS: Mental acuity  Constitutional:       He is more agitated today    No Known Allergies  OBJECTIVE: Vitals:   12/20/17 0519 12/20/17 1425 12/20/17 2029 12/21/17 0451  BP: 139/90 (!) 114/54 (!) 110/56 111/67  Pulse: (!) 109 94 92 97  Resp: 20 20 20 20   Temp: 99.5 F (37.5 C) 98.6 F (37 C) 98.7 F (37.1 C) 98.5 F (36.9 C)  TempSrc: Oral Oral Oral Oral  SpO2: 98% 99% 99% 98%  Weight:    116 lb 6.4 oz (52.8 kg)  Height:       Body mass index is 18.79 kg/m.  Physical Exam  Constitutional: He is oriented to person, place, and time.  He is thin and cachectic.   Eyes: Conjunctivae are normal.  Neck: Neck supple.  Cardiovascular: Normal rate and regular rhythm.  No murmur heard. Pulmonary/Chest: Effort normal and breath sounds normal.  Abdominal: Soft. He exhibits no distension. There is no tenderness.  Neurological: He is alert and oriented to person, place, and time.  Skin: No rash noted.  Psychiatric: Mood and affect normal.    Lab Results Lab Results  Component Value Date   WBC 3.6 (L) 12/21/2017   HGB  8.5 (L) 12/21/2017   HCT 24.5 (L) 12/21/2017   MCV 79.5 12/21/2017   PLT 230 12/21/2017    Lab Results  Component Value Date   CREATININE 0.83 12/21/2017   BUN 19 12/21/2017   NA 129 (L) 12/21/2017   K 4.8 12/21/2017   CL 110 12/21/2017   CO2 15 (L) 12/21/2017    Lab Results  Component Value Date   ALT 102 (H) 12/07/2017   AST 57 (H) 12/07/2017   ALKPHOS 104 12/07/2017   BILITOT 0.4 12/07/2017     Microbiology: Recent Results (from the past 240 hour(s))  Culture, fungus without smear     Status: None (Preliminary result)   Collection Time: 12/17/17  4:25 PM  Result Value Ref Range Status   Specimen Description   Final    CSF Performed  at [facility], [address] [place], [place] [address]    Special Requests   Final    NONE Performed at [facility], [address] [place], [place] [address]    Culture   Final    NO FUNGUS ISOLATED AFTER 3 DAYS Performed at [facility], [address] [place], [place] [address]    Report Status PENDING  Incomplete  CSF culture     Status: None   Collection Time: 12/17/17  4:56 PM  Result Value Ref Range Status   Specimen Description   Final    CSF Performed at [facility], [address] [place], [place] [address]    Special Requests   Final    NONE Performed at [facility], [address] [place], [place] [address]    Gram Stain   Final    WBC PRESENT, PREDOMINANTLY MONONUCLEAR NO ORGANISMS SEEN CYTOSPIN Gram Stain Report Called to,Read Back By and Verified With: JESSICA DEUTSCH,RN [id number] @ 1830 BY J SCOTTON ENCAPSULATED YEAST SEEN    Culture   Final    NO GROWTH 3 DAYS Performed at [facility], [address] [place], [place] [address]    Report Status 12/21/2017 FINAL  Final    Cliffton AstersJohn Chidinma Clites, MD [facility] [phone] pager   [phone] cell 12/21/2017, 3:13 PM

## 2017-12-21 NOTE — Progress Notes (Signed)
PHARMACY BRIEF NOTE:   ELECTROLYTE REPLACEMENT PER LIPOSOMAL AMPHOTERICIN B PROTOCOL   Recent Labs    12/20/17 0454 12/21/17 0541  NA 129* 129*  K 4.9 4.8  CL 108 110  CO2 15* 15*  GLUCOSE 102* 106*  BUN 20 19  CREATININE 0.75 0.83  CALCIUM 8.5* 8.5*  MG 2.4 1.5*    Assessment:  Potassium WNL  Mag low at 1.5, last replaced 4g on 2/2  SCr WNL/stable. UOP > 0.8 mL/kg/hr    Plan, per Amphotericin B Protocol:  Mag 1g IVPB   Continue NS 1000 mL (increased from 500 mL per ID) bolus IV before and after each infusion of Ambisome   Continue daily BMET, Mag  Monitor SCr, UOP  Lynann Beaverhristine Kiko Ripp PharmD, BCPS Pager [phone] 12/21/2017 8:05 AM

## 2017-12-22 LAB — BASIC METABOLIC PANEL
ANION GAP: 6 (ref 5–15)
BUN: 17 mg/dL (ref 6–20)
CO2: 16 mmol/L — AB (ref 22–32)
Calcium: 8.6 mg/dL — ABNORMAL LOW (ref 8.9–10.3)
Chloride: 108 mmol/L (ref 101–111)
Creatinine, Ser: 0.86 mg/dL (ref 0.61–1.24)
GFR calc non Af Amer: >60 mL/min (ref >60)
GLUCOSE: 98 mg/dL (ref 65–99)
POTASSIUM: 5 mmol/L (ref 3.5–5.1)
Sodium: 130 mmol/L — ABNORMAL LOW (ref 135–145)

## 2017-12-22 LAB — FUNGAL ORGANISM REFLEX

## 2017-12-22 LAB — CBC
HEMATOCRIT: 23.6 % — AB (ref 39.0–52.0)
HEMOGLOBIN: 7.9 g/dL — AB (ref 13.0–17.0)
MCH: 27 pg (ref 26.0–34.0)
MCHC: 33.5 g/dL (ref 30.0–36.0)
MCV: 80.5 fL (ref 78.0–100.0)
Platelets: 243 10*3/uL (ref 150–400)
RBC: 2.93 MIL/uL — AB (ref 4.22–5.81)
RDW: 15.1 % (ref 11.5–15.5)
WBC: 3.1 10*3/uL — AB (ref 4.0–10.5)

## 2017-12-22 LAB — HEMOGLOBIN AND HEMATOCRIT, BLOOD
HEMATOCRIT: 22.8 % — AB (ref 39.0–52.0)
HEMOGLOBIN: 7.6 g/dL — AB (ref 13.0–17.0)

## 2017-12-22 LAB — MAGNESIUM: MAGNESIUM: 1.7 mg/dL (ref 1.7–2.4)

## 2017-12-22 LAB — FUNGUS CULTURE RESULT

## 2017-12-22 MED ORDER — DEXTROSE 5 % IV SOLN
4.0000 mg/kg | Freq: Once | INTRAVENOUS | Status: AC
  Administered 2017-12-22: 200 mg via INTRAVENOUS
  Filled 2017-12-22: qty 200

## 2017-12-22 MED ORDER — FLUCYTOSINE 500 MG PO CAPS
25.0000 mg/kg | ORAL_CAPSULE | Freq: Four times a day (QID) | ORAL | Status: AC
  Administered 2017-12-22 – 2017-12-23 (×3): 1250 mg via ORAL
  Filled 2017-12-22 (×4): qty 1

## 2017-12-22 MED ORDER — DEXTROSE 5% FOR FLUSHING BEFORE AND AFTER AMBISOME
10.0000 mL | Freq: Once | INTRAVENOUS | Status: AC
  Administered 2017-12-22: 10 mL via INTRAVENOUS
  Filled 2017-12-22: qty 50

## 2017-12-22 MED ORDER — FLUCONAZOLE 100 MG PO TABS
400.0000 mg | ORAL_TABLET | Freq: Every day | ORAL | Status: DC
  Administered 2017-12-23 – 2017-12-24 (×2): 400 mg via ORAL
  Filled 2017-12-22 (×2): qty 4

## 2017-12-22 NOTE — Progress Notes (Addendum)
Patient ID: Kenneth Rivas, male   DOB: 1961-03-28, 57 y.o.   MRN: [id number]          [facility]    Date of Admission:  11/26/2017   Day 26 amphotericin and flucytosine         He is clinically unchanged.  He has minimal insight into his current condition.  I am quite doubtful that he will be able to manage his affairs at home with his roommate.  He has already failed one trial of that following his hospitalization last fall for his initial induction therapy for severe cryptococcal meningitis in the setting of advanced, untreated HIV infection.  He has now completed nearly 4 weeks of a second round of induction therapy for meningitis.  Recent CSF cultures remain negative.  I will go ahead and change him to oral fluconazole 400 mg daily.  He will need about 8 weeks of therapy at that dose before decreasing to 200 mg daily for maintenance therapy.  He wanting to have he has QT interval monitored while on fluconazole.  His QT interval was prolonged upon admission but his last EKG showed that it had normalized.  I will arrange for him to be seen back in our clinic shortly after discharge to assess his adherence and clinical status.  We will consider starting antiretroviral therapy in the next 4 weeks.           Cliffton AstersJohn Tequisha Maahs, MD [facility] [phone] pager   [phone] cell 12/22/2017, 3:58 PM

## 2017-12-22 NOTE — Progress Notes (Signed)
Occupational Therapy Treatment Patient Details Name: Kenneth Rivas MRN: [id number] DOB: June 18, 1961 Today's Date: 12/22/2017    History of present illness 57 yo male admitted with weakness, near syncopal episode.  Dx:  cryptococcal meningoencephalitis.  Hx of HIV/AIDS, Hep B, drug abuse, noncompliance, cachexia, anemia.    OT comments  tx very limited today.  "Kenneth Rivas" agreed to sit up for several minutes but would not engage in any ADL activity nor exercise.  Stood for about 30 seconds to have chux pads changed.  Follow Up Recommendations  SNF    Equipment Recommendations  3 in 1 bedside commode    Recommendations for Other Services      Precautions / Restrictions Precautions Precautions: Fall Precaution Comments: unsteady gait Restrictions Weight Bearing Restrictions: No       Mobility Bed Mobility   Bed Mobility: Supine to Sit;Sit to Supine     Supine to sit: Supervision;HOB elevated Sit to supine: Supervision;HOB elevated      Transfers       Sit to Stand: Min assist              Balance                                           ADL either performed or assessed with clinical judgement   ADL                                         General ADL Comments: Pt agreeable to sitting EOB with max encouragement.  He refused any ADL activities and exercises once up.  Agreeable to standing so that pads could be changed under him.     Vision       Perception     Praxis      Cognition Arousal/Alertness: Awake/alert Behavior During Therapy: WFL for tasks assessed/performed Overall Cognitive Status: No family/caregiver present to determine baseline cognitive functioning                               Problem Solving: Requires verbal cues General Comments: HOH, requires many cues for safety        Exercises     Shoulder Instructions       General Comments      Pertinent Vitals/ Pain       Pain  Assessment: No/denies pain  Home Living                                          Prior Functioning/Environment              Frequency  Min 2X/week        Progress Toward Goals  OT Goals(current goals can now be found in the care plan section)  Progress towards OT goals: Progressing toward goals     Plan      Co-evaluation                 AM-PAC PT "6 Clicks" Daily Activity     Outcome Measure   Help from another person eating meals?: A Little Help from another person taking care of personal grooming?: A  Little Help from another person toileting, which includes using toliet, bedpan, or urinal?: A Lot Help from another person bathing (including washing, rinsing, drying)?: A Lot Help from another person to put on and taking off regular upper body clothing?: A Lot Help from another person to put on and taking off regular lower body clothing?: Total 6 Click Score: 13    End of Session    OT Visit Diagnosis: Muscle weakness (generalized) (M62.81)   Activity Tolerance Patient limited by fatigue   Patient Left in bed;with call bell/phone within reach;with bed alarm set   Nurse Communication          Time: 1610-9604 OT Time Calculation (min): 13 min  Charges: OT General Charges $OT Visit: 1 Visit OT Treatments $Therapeutic Activity: 8-22 mins  Marica Otter, OTR/L [phone] 12/22/2017   Chais Fehringer 12/22/2017, 4:02 PM

## 2017-12-22 NOTE — Progress Notes (Signed)
PHARMACY BRIEF NOTE:   ELECTROLYTE REPLACEMENT PER LIPOSOMAL AMPHOTERICIN B PROTOCOL   Recent Labs    12/21/17 0541 12/22/17 0500  NA 129* 130*  K 4.8 5.0  CL 110 108  CO2 15* 16*  GLUCOSE 106* 98  BUN 19 17  CREATININE 0.83 0.86  CALCIUM 8.5* 8.6*  MG 1.5* 1.7    Assessment:  Potassium WNL, increased to upper end of range, monitor on Bactrim  Mag improved to 1.7, s/p replacement with a total of 2g on 2/4  SCr WNL/stable. UOP > 1.8 mL/kg/hr   Plan, per Amphotericin B Protocol:  Continue NS 1000 mL (increased from 500 mL per ID) bolus IV before and after each infusion of Ambisome   Continue daily BMET, Mag  Monitor SCr, UOP  Lynann Beaverhristine Bricia Taher PharmD, BCPS Pager [phone] 12/22/2017 8:18 AM

## 2017-12-22 NOTE — Plan of Care (Signed)
  Progressing Education: Knowledge of General Education information will improve 12/22/2017 0107 - Progressing by Cristela FeltSteffens, Towanda Hornstein P, RN Safety: Ability to remain free from injury will improve 12/22/2017 0107 - Progressing by Cristela FeltSteffens, Jakob Kimberlin P, RN

## 2017-12-22 NOTE — Progress Notes (Signed)
Medications administered by student RN [id number] with supervision of Clinical Instructor Kyce Ging MSN, RN-BC or patient's assigned RN.   

## 2017-12-22 NOTE — Progress Notes (Signed)
Pt will need to discharge home with HH[facility]. Unable to find SNF at present related to insurance and skilled needs. Will continue to follow for discharge orders.

## 2017-12-22 NOTE — Progress Notes (Addendum)
PROGRESS NOTE    Kenneth Rivas  RUE:[id number] DOB: 1961-04-03 DOA: 11/26/2017 PCP: Randall Hiss, MD   Brief Narrative:  57 y.o.male with prior history of HIV/AIDS-last CD4 count less than 10, history of cryptococcal meningitis in 2018 (not very compliant with antifungals) admitted with generalized weakness, near syncopal event. Patient was noted to have a prolonged QTC, and was also found to have recurrent/persistent cryptococcal meningitis and admitted to the hospitalist service. See below for further details  Assessment & Plan:   Principal Problem:   Cryptococcal meningoencephalitis (HCC) Active Problems:   HTN (hypertension)   Cachexia associated with AIDS (HCC)   Hypokalemia   Hyponatremia   Protein-calorie malnutrition, severe   AIDS (acquired immune deficiency syndrome) (HCC)   Prolonged QT interval   Near syncope   Cryptococcal meningitis (HCC)   Anemia   Abnormal echocardiogram   Cardiomyopathy [facility]): Per 2 d echo 11/28/2017   1. 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  Plan to extend therapy until no growth on CSF cx. 1. S/p Lumbar puncture on 12/04/2017 and 1/25 with cryptococcus 2. Repeat CSF cx from 1/31 pending, but no growth (positive for crypto antigen).  OP 17, WBC 29, protein 55, 14 RBC's.  Yeast on gram stain.  3. Appreciate ID recs.  Plan to switch to fluconazole today, 2/5 with 1/31 cx with no growth.  Plan for 8 weeks of therapy and ID clinic follow up.   2. Hyponatremia-  continue to monitor for now.  Getting IVF boluses prior to and after ampho.  3. Acute kidney injury- Improved.  Suspected related to acyclovir, improved with  IV fluids. ACE inhibitor and Lasix are currently  On hold. Bactrim was held for 2 days.  4. NAGMA: cont bicarb, may need to increase dose.   5. HIV/AIDS- ID following. ART's have been deferred in the setting of redo induction of  cryptococcal meningitis. Continue Bactrim for PCP prophylaxis.  Resume azithromycin for MAI prophylaxis. Concern for noncompliance with meds being found under bed.  Per ID, may restart antiretroviral therapy in the next 4 weeks.  6. Near syncope- secondary to dehydration/orthostatic hypotension. QTC has normalized. 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   7. Nonischemic cardiomyopathy- He remains compensated-continue with Coreg. ACE inhibitor being held. -appreciate cardiology input. Will need outpatient follow-up with cardiology.  8. Nonsustained Vtach: pt with 5 beat run vtach overnight 2/1-2/2.  Will continue to monitor.  Seen by cards in past during this hospitalization and thought poor candidate for invasive evaluation for HF or defibrillator therapy.  Replete mg/K prn.    9. QT prolongation- 500's on 1/10, improved to 460's on 1/13 1. Repeat EKG on 2/3 with QTc 437  10. Severe protein calorie malnutrition- continue supplements  11. Hypertension- continue coreg.  Hold Lisinopril, Lasix.  12. Anemia/leukopenia-  H/H slightly downtrending today.  Will hold lovenox for now.  Follow tomorrow.   13. Hyperkalemia: improved  14. NAGMA: start bicarb  # Question of his capacity  Poor Insight.  Pt not particularly engaging in care.  Demonstrates poor insight into his disease.  PT recommending SNF which would probably be best to help with compliance in the short term post hospitalization.  Considered psych for capacity eval if he was declining SNF, but he's been agreeable to this.    DVT prophylaxis: lovenox Code Status: full code Family Communication: sister, roommate, family at bedside Disposition Plan: working on [facility]  for discharge.   Consultants:   ID  Procedures:   LP on 1/18 and 1/25 and 1/31  Antimicrobials:  Anti-infectives (From admission, onward)   Start     Dose/Rate Route Frequency Ordered Stop   12/23/17 1000  fluconazole (DIFLUCAN) tablet 400  mg     400 mg Oral Daily 12/22/17 1606     12/22/17 1800  flucytosine (ANCOBON) capsule 1,250 mg     25 mg/kg  47 kg Oral Every 6 hours 12/22/17 1615 12/23/17 1159   12/22/17 1700  amphotericin B liposome (AMBISOME) 200 mg in dextrose 5 % 500 mL IVPB     4 mg/kg  49.6 kg 250 mL/hr over 120 Minutes Intravenous  Once 12/22/17 1615 12/22/17 1800   12/16/17 1800  amphotericin B liposome (AMBISOME) 200 mg in dextrose 5 % 500 mL IVPB  Status:  Discontinued     4 mg/kg  49.6 kg 250 mL/hr over 120 Minutes Intravenous Every 24 hours 12/16/17 1417 12/22/17 1606   12/15/17 1000  azithromycin (ZITHROMAX) tablet 1,200 mg     1,200 mg Oral Weekly 12/10/17 1519     12/10/17 1000  sulfamethoxazole-trimethoprim (BACTRIM DS,SEPTRA DS) 800-160 MG per tablet 1 tablet     1 tablet Oral Daily 12/06/17 1039     12/08/17 1800  azithromycin (ZITHROMAX) tablet 1,200 mg     1,200 mg Oral  Once 12/08/17 1707 12/08/17 1844   12/06/17 1800  amphotericin B liposome (AMBISOME) 150 mg in dextrose 5 % 500 mL IVPB  Status:  Discontinued     3 mg/kg  49.6 kg 250 mL/hr over 120 Minutes Intravenous Every 24 hours 12/06/17 1147 12/16/17 1418   12/04/17 1730  amphotericin B liposome (AMBISOME) 200 mg in dextrose 5 % 500 mL IVPB  Status:  Discontinued     200 mg 250 mL/hr over 120 Minutes Intravenous Every 24 hours 12/04/17 1350 12/06/17 1147   11/27/17 1800  flucytosine (ANCOBON) capsule 1,250 mg  Status:  Discontinued     25 mg/kg  47 kg Oral Every 6 hours 11/27/17 1615 12/22/17 1606   11/27/17 1730  amphotericin B liposome (AMBISOME) 240 mg in dextrose 5 % 500 mL IVPB  Status:  Discontinued     5 mg/kg  47 kg 250 mL/hr over 120 Minutes Intravenous Every 24 hours 11/27/17 1615 12/04/17 1350   11/27/17 1000  sulfamethoxazole-trimethoprim (BACTRIM DS,SEPTRA DS) 800-160 MG per tablet 1 tablet  Status:  Discontinued     1 tablet Oral Daily 11/27/17 0839 12/06/17 1039       Subjective: No complaints.     Objective: Vitals:   12/22/17 0500 12/22/17 0618 12/22/17 0859 12/22/17 1309  BP:  135/90 130/76 (!) 146/65  Pulse:  91 (!) 119 (!) 110  Resp:  20  20  Temp:  99.1 F (37.3 C)  99.3 F (37.4 C)  TempSrc:  Oral  Oral  SpO2:  100%  98%  Weight: 50.8 kg (111 lb 15.9 oz)     Height:        Intake/Output Summary (Last 24 hours) at 12/22/2017 1852 Last data filed at 12/22/2017 1626 Gross per 24 hour  Intake 500 ml  Output 2050 ml  Net -1550 ml   Filed Weights   12/18/17 0500 12/21/17 0451 12/22/17 0500  Weight: 48.3 kg (106 lb 7.7 oz) 52.8 kg (116 lb 6.4 oz) 50.8 kg (111 lb 15.9 oz)    Examination:  General: No acute distress. Cardiovascular: Heart sounds show a  regular rate, and rhythm. No gallops or rubs. No murmurs. No JVD. Lungs: Clear to auscultation bilaterally with good air movement. No rales, rhonchi or wheezes. Abdomen: Soft, nontender, nondistended with normal active bowel sounds. No masses. No hepatosplenomegaly. Neurological: Alert and oriented 3. Moves all extremities 4. Cranial nerves II through XII grossly intact. Skin: Warm and dry. No rashes or lesions. Extremities: No clubbing or cyanosis. No edema.    Data Reviewed: I have personally reviewed following labs and imaging studies  CBC: Recent Labs  Lab 12/18/17 0423 12/19/17 0459 12/20/17 0454 12/21/17 0541 12/22/17 0500 12/22/17 1647  WBC 3.6* 3.5* 3.5* 3.6* 3.1*  --   HGB 8.4* 8.7* 8.3* 8.5* 7.9* 7.6*  HCT 25.0* 25.9* 24.2* 24.5* 23.6* 22.8*  MCV 79.9 79.9 79.6 79.5 80.5  --   PLT 237 217 225 230 243  --    Basic Metabolic Panel: Recent Labs  Lab 12/18/17 0423 12/19/17 0459 12/20/17 0454 12/21/17 0541 12/22/17 0500  NA 130* 129* 129* 129* 130*  K 4.8 4.9 4.9 4.8 5.0  CL 108 107 108 110 108  CO2 18* 15* 15* 15* 16*  GLUCOSE 94 84 102* 106* 98  BUN 19 17 20 19 17   CREATININE 0.78 0.84 0.75 0.83 0.86  CALCIUM 8.8* 8.5* 8.5* 8.5* 8.6*  MG 1.9 1.7 2.4 1.5* 1.7   GFR: Estimated  Creatinine Clearance: 67.3 mL/min (by C-G formula based on SCr of 0.86 mg/dL). Liver Function Tests: No results for input(s): AST, ALT, ALKPHOS, BILITOT, PROT, ALBUMIN in the last 168 hours. No results for input(s): LIPASE, AMYLASE in the last 168 hours. No results for input(s): AMMONIA in the last 168 hours. Coagulation Profile: No results for input(s): INR, PROTIME in the last 168 hours. Cardiac Enzymes: No results for input(s): CKTOTAL, CKMB, CKMBINDEX, TROPONINI in the last 168 hours. BNP (last 3 results) No results for input(s): PROBNP in the last 8760 hours. HbA1C: No results for input(s): HGBA1C in the last 72 hours. CBG: No results for input(s): GLUCAP in the last 168 hours. Lipid Profile: No results for input(s): CHOL, HDL, LDLCALC, TRIG, CHOLHDL, LDLDIRECT in the last 72 hours. Thyroid Function Tests: No results for input(s): TSH, T4TOTAL, FREET4, T3FREE, THYROIDAB in the last 72 hours. Anemia Panel: No results for input(s): VITAMINB12, FOLATE, FERRITIN, TIBC, IRON, RETICCTPCT in the last 72 hours. Sepsis Labs: No results for input(s): PROCALCITON, LATICACIDVEN in the last 168 hours.  Recent Results (from the past 240 hour(s))  Culture, fungus without smear     Status: None (Preliminary result)   Collection Time: 12/17/17  4:25 PM  Result Value Ref Range Status   Specimen Description   Final    CSF Performed at [facility], [address] [place], [place] [address]    Special Requests   Final    NONE Performed at [facility], [address] [place], [place] [address]    Culture   Final    NO FUNGUS ISOLATED AFTER 3 DAYS Performed at [facility], [address] [place], [place] [address]    Report Status PENDING  Incomplete  Fungus Culture With Stain     Status: None (Preliminary result)   Collection Time: 12/17/17  4:25 PM  Result Value Ref Range Status   Fungus Stain Final report  Final    Comment:  (NOTE) Performed At: [facility] [address] [place], [place] [id number] Jolene SchimkeNagendra Sanjai MD OZ:[phone]    Fungus (Mycology) Culture PENDING  Incomplete   Fungal Source CSF  Final    Comment: Performed at [facility], [address], [place], [place] [address]  Fungus Culture Result     Status: None   Collection Time: 12/17/17  4:25 PM  Result Value Ref Range Status   Result 1 Comment  Final    Comment: (NOTE) KOH/Calcofluor preparation:  no fungus observed. Performed At: [facility] [address] [place], [place] [id number] Jolene Schimke MD XB:[id number] Performed at [facility], [address] [place], [place] [address]   CSF culture     Status: None   Collection Time: 12/17/17  4:56 PM  Result Value Ref Range Status   Specimen Description   Final    CSF Performed at [facility], [address] [place], [place] [address]    Special Requests   Final    NONE Performed at [facility], [address], [place], [place] [address]    Gram Stain   Final    WBC PRESENT, PREDOMINANTLY MONONUCLEAR NO ORGANISMS SEEN CYTOSPIN Gram Stain Report Called to,Read Back By and Verified With: JESSICA DEUTSCH,RN [id number] @ 1830 BY J SCOTTON ENCAPSULATED YEAST SEEN    Culture   Final    NO GROWTH 3 DAYS Performed at [facility], [address], [place], [place] [address]    Report Status 12/21/2017 FINAL  Final         Radiology Studies: No results found.      Scheduled Meds: . azithromycin  1,200 mg Oral Weekly  . carvedilol  3.125 mg Oral BID WC  . dextrose  10 mL Intravenous Once  . docusate  25 mg Both EARS BID  . enoxaparin (LOVENOX) injection  40 mg Subcutaneous Q24H  . [START ON 12/23/2017] fluconazole  400 mg Oral Daily  . flucytosine  25 mg/kg Oral Q6H  . multivitamin with minerals  1 tablet Oral Daily  . sodium bicarbonate  650 mg Oral TID  . sodium  chloride  1,000 mL Intravenous Q24H  . sodium chloride  1,000 mL Intravenous Q24H  . sodium chloride flush  3 mL Intravenous Q12H  . sulfamethoxazole-trimethoprim  1 tablet Oral Daily   Continuous Infusions:    LOS: 25 days    Time spent: over 30 min    Lacretia Nicks, MD [facility] Pager [phone]  If 7PM-7AM, please contact night-coverage [email or web address] Password [facility] 12/22/2017, 6:52 PM

## 2017-12-23 DIAGNOSIS — I5022 Chronic systolic (congestive) heart failure: Secondary | ICD-10-CM

## 2017-12-23 DIAGNOSIS — F0280 Dementia in other diseases classified elsewhere without behavioral disturbance: Secondary | ICD-10-CM

## 2017-12-23 LAB — CBC
HEMATOCRIT: 23.7 % — AB (ref 39.0–52.0)
Hemoglobin: 8.1 g/dL — ABNORMAL LOW (ref 13.0–17.0)
MCH: 27.2 pg (ref 26.0–34.0)
MCHC: 34.2 g/dL (ref 30.0–36.0)
MCV: 79.5 fL (ref 78.0–100.0)
Platelets: 240 10*3/uL (ref 150–400)
RBC: 2.98 MIL/uL — ABNORMAL LOW (ref 4.22–5.81)
RDW: 15.2 % (ref 11.5–15.5)
WBC: 3.2 10*3/uL — AB (ref 4.0–10.5)

## 2017-12-23 LAB — BASIC METABOLIC PANEL
ANION GAP: 5 (ref 5–15)
BUN: 17 mg/dL (ref 6–20)
CALCIUM: 8.6 mg/dL — AB (ref 8.9–10.3)
CO2: 16 mmol/L — AB (ref 22–32)
Chloride: 110 mmol/L (ref 101–111)
Creatinine, Ser: 0.76 mg/dL (ref 0.61–1.24)
GFR calc Af Amer: >60 mL/min (ref >60)
GFR calc non Af Amer: >60 mL/min (ref >60)
GLUCOSE: 97 mg/dL (ref 65–99)
Potassium: 4.9 mmol/L (ref 3.5–5.1)
Sodium: 131 mmol/L — ABNORMAL LOW (ref 135–145)

## 2017-12-23 LAB — MAGNESIUM: Magnesium: 1.6 mg/dL — ABNORMAL LOW (ref 1.7–2.4)

## 2017-12-23 MED ORDER — SODIUM BICARBONATE 650 MG PO TABS: 1300.0000 mg | ORAL_TABLET | Freq: Two times a day (BID) | ORAL | 0 refills | Status: DC

## 2017-12-23 MED ORDER — FLUCONAZOLE 200 MG PO TABS: 400.0000 mg | ORAL_TABLET | Freq: Every day | ORAL | 0 refills | Status: DC

## 2017-12-23 MED ORDER — MAGNESIUM SULFATE 4 GM/100ML IV SOLN
4.0000 g | Freq: Once | INTRAVENOUS | Status: AC
  Administered 2017-12-23: 4 g via INTRAVENOUS
  Filled 2017-12-23: qty 100

## 2017-12-23 MED ORDER — ENOXAPARIN SODIUM 40 MG/0.4ML ~~LOC~~ SOLN
40.0000 mg | SUBCUTANEOUS | Status: DC
  Administered 2017-12-23: 40 mg via SUBCUTANEOUS
  Filled 2017-12-23: qty 0.4

## 2017-12-23 MED ORDER — ENOXAPARIN SODIUM 30 MG/0.3ML ~~LOC~~ SOLN: 30.0000 mg | SUBCUTANEOUS | Status: DC

## 2017-12-23 MED ORDER — AZITHROMYCIN 600 MG PO TABS: 1200.0000 mg | ORAL_TABLET | ORAL | 0 refills | Status: DC

## 2017-12-23 MED ORDER — SODIUM BICARBONATE 650 MG PO TABS
1300.0000 mg | ORAL_TABLET | Freq: Two times a day (BID) | ORAL | Status: DC
  Administered 2017-12-24: 1300 mg via ORAL
  Filled 2017-12-23: qty 2

## 2017-12-23 MED ORDER — SULFAMETHOXAZOLE-TRIMETHOPRIM 800-160 MG PO TABS: 1.0000 | ORAL_TABLET | Freq: Every day | ORAL | 0 refills | Status: DC

## 2017-12-23 MED ORDER — CARVEDILOL 3.125 MG PO TABS: 3.1250 mg | ORAL_TABLET | Freq: Two times a day (BID) | ORAL | 0 refills | Status: DC

## 2017-12-23 NOTE — Progress Notes (Signed)
Pt going home with Home Health. Pt declined out of town SNF placement at present time. [facility] will follow pt at home. Pt is aware that he is discharging today.

## 2017-12-23 NOTE — Discharge Instructions (Signed)
Cryptococcosis Cryptococcosis is an infection that is caused by a fungus. This infection usually affects the lungs, but it can also spread to the brain and the central nervous system. Cryptococcosis can be serious if it spreads to the central nervous system. It is more serious in people who have a weak immune system. What are the causes? This condition can be caused by either of the following types of fungi:  Cryptococcus gattii (C. gattii).  Cryptococcus neoformans. This fungus lives in soil, bird droppings, and decaying wood.  You can get this condition by breathing in either of these fungi. What increases the risk? This condition is more likely to develop in people who:  Have HIV or AIDS.  Have had an organ transplant.  Are taking a medicine that causes the immune system to stop working normally, such as chemotherapy medicine.  What are the signs or symptoms? Symptoms of this condition include:  Dry cough.  Headache.  Blurred vision.  Fever.  If the infection spreads to the nervous system, symptoms can also include:  Stiff neck or neck pain.  Nausea and vomiting.  Confusion.  Sensitivity to light.  People with a healthy immune system may not have symptoms. How is this diagnosed? This condition is diagnosed based on:  Your medical history.  Your symptoms.  A physical exam.  Your travel history.  Tests, such as: ? Blood tests. ? Tests of the fluid from your lungs (sputum). ? Tests of the fluid that surrounds your brain and spinal cord. ? Imaging tests of your lungs and brain, including X-rays and a CT scan.  How is this treated? This condition may be treated with antifungal medicines. In some cases, treatment is not needed. Follow these instructions at home:  Take over-the-counter and prescription medicines only as told by your health care provider.  Get plenty of rest.  Drink enough fluid to keep your urine clear or pale yellow.  Keep all  follow-up visits as told by your health care provider. This is important. Contact a health care provider if:  You cannot take your medicine because of nausea or vomiting.  You have unexplained weight loss.  Your symptoms do not get better with treatment.  Your symptoms return after treatment. Get help right away if:  You have chest pain.  You have trouble breathing.  You have blurred vision.  The infection spreads to your nervous systems. Signs that the infection may have spread include: ? Headache. ? Stiff neck or neck pain. ? Fever. ? Nausea and vomiting. ? Confusion. ? Sensitivity to light. This information is not intended to replace advice given to you by your health care provider. Make sure you discuss any questions you have with your health care provider. Document Released: 10/02/2005 Document Revised: 09/22/2016 Document Reviewed: 09/22/2016 Elsevier Interactive Patient Education  [employer]2018 [employer] Inc[employer]

## 2017-12-23 NOTE — Progress Notes (Signed)
Patient ID: Kenneth Rivas, male   DOB: 03-21-1961, 57 y.o.   MRN: [id number]         [facility]  Date of Admission:  11/26/2017           Day 27 cryptococcal therapy        Day 2 fluconazole ASSESSMENT: He seems to be doing better on his second round of therapy for severe cryptococcal meningitis in the setting of advanced, untreated HIV infection.  However his functional status remains extremely poor.  I do not believe that he can manage his illness as an outpatient, even with the assistance of his roommate.  PLAN: 1. Continue fluconazole  Principal Problem:   Cryptococcal meningoencephalitis (HCC) Active Problems:   AIDS (acquired immune deficiency syndrome) (HCC)   HTN (hypertension)   Cachexia associated with AIDS (HCC)   Hypokalemia   Hyponatremia   Protein-calorie malnutrition, severe   Prolonged QT interval   Near syncope   Cryptococcal meningitis (HCC)   Anemia   Abnormal echocardiogram   Cardiomyopathy [facility]): Per 2 d echo 11/28/2017   Scheduled Meds: . azithromycin  1,200 mg Oral Weekly  . carvedilol  3.125 mg Oral BID WC  . docusate  25 mg Both EARS BID  . fluconazole  400 mg Oral Daily  . multivitamin with minerals  1 tablet Oral Daily  . sodium bicarbonate  650 mg Oral TID  . sodium chloride flush  3 mL Intravenous Q12H  . sulfamethoxazole-trimethoprim  1 tablet Oral Daily   Continuous Infusions:  PRN Meds:.acetaminophen **OR** [DISCONTINUED] acetaminophen, hydrALAZINE, meperidine (DEMEROL) injection   SUBJECTIVE: He denies headache.  Review of Systems: Review of Systems  Unable to perform ROS: Mental acuity    No Known Allergies  OBJECTIVE: Vitals:   12/22/17 2012 12/23/17 0500 12/23/17 0647 12/23/17 0742  BP: 122/72  113/80 137/70  Pulse: 94  89 91  Resp: 20  18   Temp: 99.1 F (37.3 C)  99.3 F (37.4 C)   TempSrc: Oral  Oral   SpO2: 99%  100%   Weight:  104 lb 15 oz (47.6 kg)    Height:       Body mass  index is 16.94 kg/m.  Physical Exam  Constitutional: He is oriented to person, place, and time.  He is slumped over in bed with his eyes closed as he has been every day and I have seen him.  He seems to be somewhat hard of hearing.  He he is in no distress.  Eyes: Conjunctivae are normal.  Neck: Neck supple.  Cardiovascular: Normal rate and regular rhythm.  No murmur heard. Pulmonary/Chest: Effort normal and breath sounds normal.  Abdominal: Soft. He exhibits no distension. There is no tenderness.  Neurological: He is alert and oriented to person, place, and time.  Skin: No rash noted.  Psychiatric: Mood and affect normal.    Lab Results Lab Results  Component Value Date   WBC 3.2 (L) 12/23/2017   HGB 8.1 (L) 12/23/2017   HCT 23.7 (L) 12/23/2017   MCV 79.5 12/23/2017   PLT 240 12/23/2017    Lab Results  Component Value Date   CREATININE 0.76 12/23/2017   BUN 17 12/23/2017   NA 131 (L) 12/23/2017   K 4.9 12/23/2017   CL 110 12/23/2017   CO2 16 (L) 12/23/2017    Lab Results  Component Value Date   ALT 102 (H) 12/07/2017   AST 57 (H) 12/07/2017   ALKPHOS 104 12/07/2017  BILITOT 0.4 12/07/2017     Microbiology: Recent Results (from the past 240 hour(s))  Culture, fungus without smear     Status: None (Preliminary result)   Collection Time: 12/17/17  4:25 PM  Result Value Ref Range Status   Specimen Description   Final    CSF Performed at [facility], [address] [place], [place] [address]    Special Requests   Final    NONE Performed at [facility], [address] [place], [place] [address]    Culture   Final    NO FUNGUS ISOLATED AFTER 3 DAYS Performed at [facility], [address] [place], [place] [address]    Report Status PENDING  Incomplete  Fungus Culture With Stain     Status: None (Preliminary result)   Collection Time: 12/17/17  4:25 PM  Result Value Ref Range Status   Fungus Stain Final report   Final    Comment: (NOTE) Performed At: [facility] [address] [place], [place] [id number] Jolene SchimkeNagendra Sanjai MD IO:[id number]:[phone]    Fungus (Mycology) Culture PENDING  Incomplete   Fungal Source CSF  Final    Comment: Performed at [facility], [address] [place], [place] [address]  Fungus Culture Result     Status: None   Collection Time: 12/17/17  4:25 PM  Result Value Ref Range Status   Result 1 Comment  Final    Comment: (NOTE) KOH/Calcofluor preparation:  no fungus observed. Performed At: [facility] [address] [place], [place] [id number] Jolene SchimkeNagendra Sanjai MD QI:[id number]:[phone] Performed at [facility], [address] [place], [place] [address]   CSF culture     Status: None   Collection Time: 12/17/17  4:56 PM  Result Value Ref Range Status   Specimen Description   Final    CSF Performed at [facility], [address] [place], [place] [address]    Special Requests   Final    NONE Performed at [facility], [address] [place], [place] [address]    Gram Stain   Final    WBC PRESENT, PREDOMINANTLY MONONUCLEAR NO ORGANISMS SEEN CYTOSPIN Gram Stain Report Called to,Read Back By and Verified With: JESSICA DEUTSCH,RN [id number] @ 1830 BY J SCOTTON ENCAPSULATED YEAST SEEN    Culture   Final    NO GROWTH 3 DAYS Performed at [facility], [address] [place], [place] [address]    Report Status 12/21/2017 FINAL  Final    Cliffton AstersJohn Jacklyn Branan, MD [facility] [phone] pager   [phone] 12/23/2017, 11:02 AM

## 2017-12-23 NOTE — Progress Notes (Signed)
Physical Therapy Treatment Patient Details Name: Jacolyn Reedylonzo Korell MRN: [id number] DOB: 07-19-1961 Today's Date: 12/23/2017    History of Present Illness 57 yo male admitted with weakness, near syncopal episode.  Dx:  cryptococcal meningoencephalitis.  Hx of HIV/AIDS, Hep B, drug abuse, noncompliance, cachexia, anemia.     PT Comments    Pt is unsteady with gait and requires physical A for safe gait with RW.  He has decreased step length with shuffeling pattern with increased R lateral lean as gait progresses..  With turns he demonstrates decreased safety as well as decreased balance and needs tactile cueing.  He is a fall risk and is not safe to ambulate without assistance at this time.  Recommend SNF.    Follow Up Recommendations  SNF     Equipment Recommendations  Wheelchair cushion (measurements PT);Wheelchair (measurements PT)    Recommendations for Other Services       Precautions / Restrictions Precautions Precautions: Fall Precaution Comments: unsteady gait Restrictions Weight Bearing Restrictions: No    Mobility  Bed Mobility   Bed Mobility: Supine to Sit     Supine to sit: Supervision;HOB elevated        Transfers Overall transfer level: Needs assistance Equipment used: Rolling walker (2 wheeled) Transfers: Sit to/from Stand Sit to Stand: Min assist         General transfer comment: A to power up and for RW placement.  Tactile cues with stand > sit due to wanting to sit too early.  Ambulation/Gait Ambulation/Gait assistance: Min assist Ambulation Distance (Feet): 75 Feet(x2) Assistive device: Rolling walker (2 wheeled) Gait Pattern/deviations: Decreased step length - right;Decreased step length - left;Shuffle;Trunk flexed;Decreased stance time - left     General Gait Details: Pt unsteady with gait, especially with turning.  He demonstrates increased lateral lean to the R as he fatigues and required MIN A. Pt required 2 standing rest breaks.  He was able  to increase step lenth with cueing, but without cueing he immediately  returned to decreased step length   Stairs            Wheelchair Mobility    Modified Rankin (Stroke Patients Only)       Balance                                            Cognition Arousal/Alertness: Awake/alert Behavior During Therapy: WFL for tasks assessed/performed Overall Cognitive Status: No family/caregiver present to determine baseline cognitive functioning                               Problem Solving: Requires verbal cues;Requires tactile cues General Comments: HOH, requires many cues for safety      Exercises      General Comments General comments (skin integrity, edema, etc.): Pt's condom cath had come off and urine on sheets      Pertinent Vitals/Pain Pain Assessment: No/denies pain    Home Living                      Prior Function            PT Goals (current goals can now be found in the care plan section) Acute Rehab PT Goals PT Goal Formulation: With patient Time For Goal Achievement: 12/30/17 Potential to Achieve Goals: Fair Progress towards  PT goals: Progressing toward goals    Frequency    Min 3X/week      PT Plan Current plan remains appropriate    Co-evaluation              AM-PAC PT "6 Clicks" Daily Activity  Outcome Measure  Difficulty turning over in bed (including adjusting bedclothes, sheets and blankets)?: A Little Difficulty moving from lying on back to sitting on the side of the bed? : A Little Difficulty sitting down on and standing up from a chair with arms (e.g., wheelchair, bedside commode, etc,.)?: Unable Help needed moving to and from a bed to chair (including a wheelchair)?: A Little Help needed walking in hospital room?: A Little Help needed climbing 3-5 steps with a railing? : A Little 6 Click Score: 16    End of Session Equipment Utilized During Treatment: Gait belt Activity  Tolerance: Patient tolerated treatment well Patient left: in chair;with call bell/phone within reach;with chair alarm set Nurse Communication: Mobility status PT Visit Diagnosis: Muscle weakness (generalized) (M62.81);Difficulty in walking, not elsewhere classified (R26.2)     Time: 9147-8295 PT Time Calculation (min) (ACUTE ONLY): 15 min  Charges:  $Gait Training: 8-22 mins                    G Codes:       Hadiya Spoerl L. Katrinka Blazing, [place] Pager [phone] 12/23/2017    Enzo Montgomery 12/23/2017, 11:23 AM

## 2017-12-23 NOTE — Progress Notes (Addendum)
ANTICOAGULATION CONSULT NOTE - Initial Consult  Pharmacy Consult for enoxaparin Indication: VTE prophylaxis  No Known Allergies  Patient Measurements: Height: 5\' 6"  (167.6 cm) Weight: 104 lb 15 oz (47.6 kg) IBW/kg (Calculated) : 63.8 Heparin Dosing Weight:   Vital Signs: Temp: 98.7 F (37.1 C) (02/06 2112) Temp Source: Oral (02/06 2112) BP: 122/68 (02/06 2112) Pulse Rate: 84 (02/06 2112)  Labs: Recent Labs    12/21/17 0541 12/22/17 0500 12/22/17 1647 12/23/17 0422  HGB 8.5* 7.9* 7.6* 8.1*  HCT 24.5* 23.6* 22.8* 23.7*  PLT 230 243  --  240  CREATININE 0.83 0.86  --  0.76    Estimated Creatinine Clearance: 67.8 mL/min (by C-G formula based on SCr of 0.76 mg/dL).   Medical History: Past Medical History:  Diagnosis Date  . AIDS (HCC)   . Anemia   . Anxiety   . Cellulitis of right hand 06/23/2017  . Hepatitis B   . HIV disease (HCC)   . Hypertension   . Immune deficiency disorder (HCC)     Medications:  Scheduled:  . azithromycin  1,200 mg Oral Weekly  . carvedilol  3.125 mg Oral BID WC  . docusate  25 mg Both EARS BID  . fluconazole  400 mg Oral Daily  . multivitamin with minerals  1 tablet Oral Daily  . [START ON 12/24/2017] sodium bicarbonate  1,300 mg Oral BID  . sodium chloride flush  3 mL Intravenous Q12H  . sulfamethoxazole-trimethoprim  1 tablet Oral Daily    Assessment: Pharmacy is consulted to dose enoxaparin in 57 yo male for VTE prophylaxis.  Today, 12/23/17  Wt is 47.6 kg  SCr 0.76, Crcl 68 ml/min  Hgb 8.1, low but stable. Plt 240   Goal of Therapy:  Anti-Xa level 0.6-1 units/ml 4hrs after LMWH dose given Monitor platelets by anticoagulation protocol: Yes   Plan:  Lovenox 40 mg q24h  Monitor for signs and symptoms of bleeding   Dosage will likely remain stable at above dosage and need for further dosage adjustment appears unlikely at present.    Will sign off at this time.  Please reconsult if a change in clinical status warrants  re-evaluation of dosage.   Adalberto ColeNikola Ilse Billman, PharmD, BCPS Pager [phone] 12/23/2017 9:18 PM

## 2017-12-23 NOTE — Progress Notes (Signed)
Spoke with pt's sister Aggie Cosierheresa [phone] concerning pt being discharge to home and to ask about transportation. Aggie Cosierheresa referred this CM to call Mr. Leafy RoRicky Pratt pt's roommate at [phone]. Several calls made with no answer. Pt's sister Aggie Cosierheresa states that she wish her brother would go to a SNF in the condition that he is in. Aggie Cosierheresa continues to say that pt told her also that he wanted to go home.

## 2017-12-23 NOTE — Plan of Care (Signed)
  Progressing Clinical Measurements: Ability to maintain clinical measurements within normal limits will improve 12/23/2017 0103 - Progressing by Cristela FeltSteffens, Venisha Boehning P, RN Safety: Ability to remain free from injury will improve 12/23/2017 0103 - Progressing by Cristela FeltSteffens, Luka Reisch P, RN

## 2017-12-23 NOTE — Progress Notes (Signed)
Nutrition Follow-up  DOCUMENTATION CODES:   Underweight, Severe malnutrition in context of chronic illness  INTERVENTION:   Magic cup TID with meals, each supplement provides 290 kcal and 9 grams of protein  NUTRITION DIAGNOSIS:   Severe Malnutrition related to chronic illness(HIV with non compliance) as evidenced by percent weight loss, severe fat depletion, moderate fat depletion, severe muscle depletion.  Ongoing  GOAL:   Patient will meet greater than or equal to 90% of their needs  Meeting  MONITOR:   PO intake, Supplement acceptance, Weight trends, Labs  REASON FOR ASSESSMENT:   Consult Assessment of nutrition requirement/status  ASSESSMENT:   Pt with PMH significant for HIV/AIDS with poor compliance, drug abuse, HTN, and anemia. Presents this admission with near syncopal event. Admitted for prolonged QT interval and dehydration.    Pt eating 100% of all meals without complication. Spoke with RN who reports pt is asking for multiple chocolate ice creams throughout the day and night. Weight noted to trend up 1 lb since last RD visit (110 to 111). Pt waiting for SNF placement. RD to sign off. Continue with current intervention.   Medications reviewed and include: MVI with minerals, sodium bicarb, Mag sulfate Labs reviewed: Na 131 (L) Mag 1.6 (L)  Diet Order:  Diet 2 gram sodium Room service appropriate? Yes; Fluid consistency: Thin; Fluid restriction: 1200 mL Fluid  EDUCATION NEEDS:   Not appropriate for education at this time  Skin:  Skin Assessment: Reviewed RN Assessment  Last BM:  12/22/17  Height:   Ht Readings from Last 1 Encounters:  11/27/17 5\' 6"  (1.676 m)    Weight:   Wt Readings from Last 1 Encounters:  12/23/17 104 lb 15 oz (47.6 kg)    Ideal Body Weight:  70 kg  BMI:  Body mass index is 16.94 kg/m.  Estimated Nutritional Needs:   Kcal:  2100-2300 kcal/day  Protein:  100-110 g/day  Fluid:  >2.1 L/day    Vanessa Kickarly Cherrie Franca RD,  LDN Clinical Nutrition Pager # - [phone]

## 2017-12-23 NOTE — Progress Notes (Signed)
PROGRESS NOTE  Kenneth Rivas  WUJ:[id number] DOB: 02-04-1961 DOA: 11/26/2017 PCP: Randall Hiss, MD  Brief Narrative:   57 y.o.male with prior history of AIDS-last CD4 count less than 10, history of cryptococcal meningitis in 2018 (not very compliant with antifungals) admitted with generalized weakness, near syncopal event.  The patient was found to have persistent cryptococcal meningitis and underwent reinduction therapy with IV amphotericin and flucytosine.  He completed his flucytosine on the morning of 2/6 and has been transitioned to oral fluconazole.  He will need at least 8 weeks of fluconazole 400 mg daily before his dose is reduced and he will need to be seen in the infectious disease clinic prior to that reduction.  His course is also been complicated by acute kidney injury which was likely related to acyclovir which has since improved.  He has been started on prophylactic Bactrim and azithromycin for his AIDS.  He has not been started yet on antiretroviral therapy as this will be done as an outpatient per ID.  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 Tenny Craw as an outpatient for ongoing management.  Currently, he has no insight into his medical problems.  He has been here for almost a month and when I asked him why he was in the hospital, he stated that he almost passed out and his family made him come to the hospital.  But the patient was unable to explain to me what his underlying medical problem was or even try to describe where or what was infected.  He repeats that he will definitely take his medications when he leaves the hospital but the reason for this admission was that he was noncompliant with his medications last time.  We were unable to reach his roommate/partner who assist him with his medications and with his medical care to discuss his medical problems and the importance of his medications.  Until we are able to  communicate with someone who has the capacity to understand the medical care that Mr. adult he requires, he is not safe to be discharged.  I have also consulted psychiatry to confirm that this patient does not have capacity to make medical decisions for himself and also to determine whether or not he has capacity to make decisions regarding his disposition.  He is very weak and therapy has been recommending that he go to a skilled nursing facility.  We have not been able to verify that the patient's partner will be able to provide the level of assistance that the patient needs at home.  He is mostly bedridden and incontinent and requires medication dosing multiple times per day.  The patient currently refuses to go to a skilled nursing facility, but he was not oriented to place or time during my interview today and he argued with me when I tried to explain he was in [facility] hospital.   Assessment & Plan:  Persistent cryptococcal meningitis S/p Lumbar puncture on 12/04/2017, 1/25 with cryptococcus.  Culture from 1/31 pending.  Encapsulated yeast seen on gram stain.  Completed reinduction therapy with IV amphotericin (increased to 4 mg/kg per ID) and flucytosine -Fluconazole 400 mg daily times 8 weeks, day 1 on 2/6 - Plan to extend therapy until no growth on CSF cx -  Appreciate ID assistance  AIDS, CD4 10.  -  Restarted Bactrim for PCP prophylaxis -  Started azithromycin for MAI prophylaxis -  Per ID, may restart  antiretroviral therapy in the next 4 weeks.  Hyponatremia-mild and asymptomatic  Acute kidney injury-creatinine trended down from 1.45 to 0.76 with IV fluids.  Suspected related to acyclovir, improved with IV fluids.  -Continue to hold ACE inhibitor and Lasix   NAGMA, bicarb still 16 -  Increase sodium bicarb to 1300mg  BID  Near syncope- secondary to dehydration/orthostatic hypotension. QTC has normalized. 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 -  outpatient follow-up with cardiology.  Nonsustained Vtach: pt with 5 beat run vtach overnight 2/1-2/2, none in last 24 hours -  poor candidate for invasive evaluation for HF or defibrillator therapy.  Replete mg/K prn.    QT prolongation-500's on 1/10, improved to 460's on 1/13 1. Repeat EKG on 2/3 with QTc 437  Severe protein calorie malnutrition- continue supplements  Hypertension- continue coreg.  Hold Lisinopril, Lasix.  Anemia/leukopenia- H&H stable around 8.1g/dl.  Will hold lovenox for now.  Follow tomorrow.   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  No Insight into medical conditions.  Not oriented to place or time.  Doubt that he has capacity to make his own medical decisions and although he is adamant he does not want to go to SNF, he also is not oriented to where he currently is.   -  Psychiatry consultation   DVT prophylaxis:  lovenox Code Status:  Full code Family Communication:  Patient alone.  Unable to reach partner/roommate  Disposition Plan:  Psychiatry consultation, then either to SNF or to home with maximized home health services.     Consultants:   ID  Cardiology  Procedures:   LP on 1/18 and 1/25 and 1/31     Antimicrobials:  Anti-infectives (From admission, onward)   Start     Dose/Rate Route Frequency Ordered Stop   12/29/17 0000  azithromycin (ZITHROMAX) 600 MG tablet     1,200 mg Oral Weekly 12/23/17 1101 01/28/18 2359   12/24/17 0000  fluconazole (DIFLUCAN) 200 MG tablet     400 mg Oral Daily 12/23/17 1101 01/23/18 2359   12/24/17 0000  sulfamethoxazole-trimethoprim (BACTRIM DS,SEPTRA DS) 800-160 MG tablet     1 tablet Oral Daily 12/23/17 1101 01/23/18 2359   12/23/17 1000  fluconazole (DIFLUCAN) tablet 400 mg     400 mg Oral Daily 12/22/17 1606     12/22/17 1800  flucytosine (ANCOBON) capsule 1,250 mg     25 mg/kg  47 kg Oral Every 6 hours 12/22/17 1615 12/23/17 0646    12/22/17 1700  amphotericin B liposome (AMBISOME) 200 mg in dextrose 5 % 500 mL IVPB     4 mg/kg  49.6 kg 250 mL/hr over 120 Minutes Intravenous  Once 12/22/17 1615 12/22/17 1901   12/16/17 1800  amphotericin B liposome (AMBISOME) 200 mg in dextrose 5 % 500 mL IVPB  Status:  Discontinued     4 mg/kg  49.6 kg 250 mL/hr over 120 Minutes Intravenous Every 24 hours 12/16/17 1417 12/22/17 1606   12/15/17 1000  azithromycin (ZITHROMAX) tablet 1,200 mg     1,200 mg Oral Weekly 12/10/17 1519     12/10/17 1000  sulfamethoxazole-trimethoprim (BACTRIM DS,SEPTRA DS) 800-160 MG per tablet 1 tablet     1 tablet Oral Daily 12/06/17 1039     12/08/17 1800  azithromycin (ZITHROMAX) tablet 1,200 mg     1,200 mg Oral  Once 12/08/17 1707 12/08/17 1844  12/06/17 1800  amphotericin B liposome (AMBISOME) 150 mg in dextrose 5 % 500 mL IVPB  Status:  Discontinued     3 mg/kg  49.6 kg 250 mL/hr over 120 Minutes Intravenous Every 24 hours 12/06/17 1147 12/16/17 1418   12/04/17 1730  amphotericin B liposome (AMBISOME) 200 mg in dextrose 5 % 500 mL IVPB  Status:  Discontinued     200 mg 250 mL/hr over 120 Minutes Intravenous Every 24 hours 12/04/17 1350 12/06/17 1147   11/27/17 1800  flucytosine (ANCOBON) capsule 1,250 mg  Status:  Discontinued     25 mg/kg  47 kg Oral Every 6 hours 11/27/17 1615 12/22/17 1606   11/27/17 1730  amphotericin B liposome (AMBISOME) 240 mg in dextrose 5 % 500 mL IVPB  Status:  Discontinued     5 mg/kg  47 kg 250 mL/hr over 120 Minutes Intravenous Every 24 hours 11/27/17 1615 12/04/17 1350   11/27/17 1000  sulfamethoxazole-trimethoprim (BACTRIM DS,SEPTRA DS) 800-160 MG per tablet 1 tablet  Status:  Discontinued     1 tablet Oral Daily 11/27/17 0839 12/06/17 1039       Subjective:  Wants to go home.  Denies headache, blurry vision.  Hungry.  Unable to tell me accurately where he is and certainly unable to even start to explain the medical condition for which he is being treated.   Only can tell me he felt faint and came to the hospital.  Seems surprised when I mentioned infection around the brain.    Objective: Vitals:   12/23/17 0500 12/23/17 0647 12/23/17 0742 12/23/17 1505  BP:  113/80 137/70 115/89  Pulse:  89 91 81  Resp:  18  18  Temp:  99.3 F (37.4 C)  98.4 F (36.9 C)  TempSrc:  Oral  Axillary  SpO2:  100%  99%  Weight: 47.6 kg (104 lb 15 oz)     Height:        Intake/Output Summary (Last 24 hours) at 12/23/2017 2010 Last data filed at 12/23/2017 1700 Gross per 24 hour  Intake 1183 ml  Output 2940 ml  Net -1757 ml   Filed Weights   12/21/17 0451 12/22/17 0500 12/23/17 0500  Weight: 52.8 kg (116 lb 6.4 oz) 50.8 kg (111 lb 15.9 oz) 47.6 kg (104 lb 15 oz)    Examination:  General exam:  Thin adult male.  No acute distress.  HEENT:  NCAT, MMM, dysconjugate gaze Respiratory system: Clear to auscultation bilaterally Cardiovascular system: Regular rate and rhythm, normal S1/S2. No murmurs, rubs, gallops or clicks.  Warm extremities Gastrointestinal system: Normal active bowel sounds, soft, nondistended, nontender. MSK:  Normal tone and bulk, no lower extremity edema Neuro:  Grossly intact    Data Reviewed: I have personally reviewed following labs and imaging studies  CBC: Recent Labs  Lab 12/19/17 0459 12/20/17 0454 12/21/17 0541 12/22/17 0500 12/22/17 1647 12/23/17 0422  WBC 3.5* 3.5* 3.6* 3.1*  --  3.2*  HGB 8.7* 8.3* 8.5* 7.9* 7.6* 8.1*  HCT 25.9* 24.2* 24.5* 23.6* 22.8* 23.7*  MCV 79.9 79.6 79.5 80.5  --  79.5  PLT 217 225 230 243  --  240   Basic Metabolic Panel: Recent Labs  Lab 12/19/17 0459 12/20/17 0454 12/21/17 0541 12/22/17 0500 12/23/17 0422  NA 129* 129* 129* 130* 131*  K 4.9 4.9 4.8 5.0 4.9  CL 107 108 110 108 110  CO2 15* 15* 15* 16* 16*  GLUCOSE 84 102* 106* 98 97  BUN 17  20 19 17 17   CREATININE 0.84 0.75 0.83 0.86 0.76  CALCIUM 8.5* 8.5* 8.5* 8.6* 8.6*  MG 1.7 2.4 1.5* 1.7 1.6*   GFR: Estimated  Creatinine Clearance: 67.8 mL/min (by C-G formula based on SCr of 0.76 mg/dL). Liver Function Tests: No results for input(s): AST, ALT, ALKPHOS, BILITOT, PROT, ALBUMIN in the last 168 hours. No results for input(s): LIPASE, AMYLASE in the last 168 hours. No results for input(s): AMMONIA in the last 168 hours. Coagulation Profile: No results for input(s): INR, PROTIME in the last 168 hours. Cardiac Enzymes: No results for input(s): CKTOTAL, CKMB, CKMBINDEX, TROPONINI in the last 168 hours. BNP (last 3 results) No results for input(s): PROBNP in the last 8760 hours. HbA1C: No results for input(s): HGBA1C in the last 72 hours. CBG: No results for input(s): GLUCAP in the last 168 hours. Lipid Profile: No results for input(s): CHOL, HDL, LDLCALC, TRIG, CHOLHDL, LDLDIRECT in the last 72 hours. Thyroid Function Tests: No results for input(s): TSH, T4TOTAL, FREET4, T3FREE, THYROIDAB in the last 72 hours. Anemia Panel: No results for input(s): VITAMINB12, FOLATE, FERRITIN, TIBC, IRON, RETICCTPCT in the last 72 hours. Urine analysis:    Component Value Date/Time   COLORURINE YELLOW 11/30/2017 1150   APPEARANCEUR CLEAR 11/30/2017 1150   LABSPEC 1.009 11/30/2017 1150   PHURINE 8.0 11/30/2017 1150   GLUCOSEU 50 (A) 11/30/2017 1150   HGBUR NEGATIVE 11/30/2017 1150   BILIRUBINUR NEGATIVE 11/30/2017 1150   KETONESUR NEGATIVE 11/30/2017 1150   PROTEINUR NEGATIVE 11/30/2017 1150   NITRITE NEGATIVE 11/30/2017 1150   LEUKOCYTESUR NEGATIVE 11/30/2017 1150   Sepsis Labs: @LABRCNTIP (procalcitonin:4,lacticidven:4)  ) Recent Results (from the past 240 hour(s))  Culture, fungus without smear     Status: None (Preliminary result)   Collection Time: 12/17/17  4:25 PM  Result Value Ref Range Status   Specimen Description   Final    CSF Performed at [facility], [address], [place], [place] [address]    Special Requests   Final    NONE Performed at [facility], [address] [place], [place] [address]    Culture   Final    NO FUNGUS ISOLATED AFTER 5 DAYS Performed at [facility], [address], [place], [place] [address]    Report Status PENDING  Incomplete  Fungus Culture With Stain     Status: None (Preliminary result)   Collection Time: 12/17/17  4:25 PM  Result Value Ref Range Status   Fungus Stain Final report  Final    Comment: (NOTE) Performed At: [facility] [address] [place], [place] [id number] Jolene Schimke MD OZ:[id number]    Fungus (Mycology) Culture PENDING  Incomplete   Fungal Source CSF  Final    Comment: Performed at [facility], [address], [place], [place] [address]  Fungus Culture Result     Status: None   Collection Time: 12/17/17  4:25 PM  Result Value Ref Range Status   Result 1 Comment  Final    Comment: (NOTE) KOH/Calcofluor preparation:  no fungus observed. Performed At: [facility] [address] [place], [place] [id number] Jolene Schimke MD UU:[id number] Performed at [facility], [address] [place], [place] [address]   CSF culture     Status: None   Collection Time: 12/17/17  4:56 PM  Result Value Ref Range Status   Specimen Description   Final    CSF Performed at [facility], [address] [place], [place] [address]    Special Requests   Final    NONE Performed at [facility], [address], [place], [place] [address]    Gram Stain   Final    WBC PRESENT, PREDOMINANTLY MONONUCLEAR NO ORGANISMS SEEN CYTOSPIN Gram Stain Report Called to,Read Back By and Verified With: JESSICA DEUTSCH,RN [id number] @ 1830 BY J SCOTTON ENCAPSULATED YEAST SEEN    Culture   Final    NO GROWTH 3 DAYS Performed at [facility], [address], [place], [place] [address]    Report Status 12/21/2017 FINAL  Final      Radiology Studies: No results found.   Scheduled Meds: .  azithromycin  1,200 mg Oral Weekly  . carvedilol  3.125 mg Oral BID WC  . docusate  25 mg Both EARS BID  . fluconazole  400 mg Oral Daily  . multivitamin with minerals  1 tablet Oral Daily  . sodium bicarbonate  650 mg Oral TID  . sodium chloride flush  3 mL Intravenous Q12H  . sulfamethoxazole-trimethoprim  1 tablet Oral Daily   Continuous Infusions:   LOS: 26 days    Time spent: 30 min    Renae Fickle, MD [facility] Pager [phone]  If 7PM-7AM, please contact night-coverage [email or web address] Password [email or web address] 12/23/2017, 8:10 PM

## 2017-12-23 NOTE — Plan of Care (Signed)
  Progressing Education: Knowledge of General Education information will improve 12/23/2017 1004 - Progressing by Gwendlyn DeutscherStevens, Yeny Schmoll E, RN Health Behavior/Discharge Planning: Ability to manage health-related needs will improve 12/23/2017 1004 - Progressing by Gwendlyn DeutscherStevens, Maceo Hernan E, RN Clinical Measurements: Ability to maintain clinical measurements within normal limits will improve 12/23/2017 1004 - Progressing by Gwendlyn DeutscherStevens, Galya Dunnigan E, RN Will remain free from infection 12/23/2017 1004 - Progressing by Gwendlyn DeutscherStevens, Lateia Fraser E, RN Note Abx changed from IV to PO.  Diagnostic test results will improve 12/23/2017 1004 - Progressing by Gwendlyn DeutscherStevens, Olayinka Gathers E, RN Safety: Ability to remain free from injury will improve 12/23/2017 1004 - Progressing by Gwendlyn DeutscherStevens, Kyo Cocuzza E, RN

## 2017-12-24 ENCOUNTER — Encounter (HOSPITAL_COMMUNITY): Payer: Self-pay

## 2017-12-24 ENCOUNTER — Other Ambulatory Visit: Payer: Self-pay

## 2017-12-24 ENCOUNTER — Emergency Department (HOSPITAL_COMMUNITY)
Admission: EM | Admit: 2017-12-24 | Discharge: 2017-12-25 | Disposition: A | Discharge status: Home / Self Care | Payer: Medicaid Other | Attending: Emergency Medicine | Admitting: Emergency Medicine

## 2017-12-24 DIAGNOSIS — F17210 Nicotine dependence, cigarettes, uncomplicated: Secondary | ICD-10-CM | POA: Insufficient documentation

## 2017-12-24 DIAGNOSIS — Z750 Medical services not available in home: Secondary | ICD-10-CM | POA: Diagnosis not present

## 2017-12-24 DIAGNOSIS — I10 Essential (primary) hypertension: Secondary | ICD-10-CM | POA: Insufficient documentation

## 2017-12-24 DIAGNOSIS — Z79899 Other long term (current) drug therapy: Secondary | ICD-10-CM | POA: Insufficient documentation

## 2017-12-24 DIAGNOSIS — Z9114 Patient's other noncompliance with medication regimen: Secondary | ICD-10-CM

## 2017-12-24 DIAGNOSIS — F0390 Unspecified dementia without behavioral disturbance: Secondary | ICD-10-CM | POA: Insufficient documentation

## 2017-12-24 DIAGNOSIS — Z008 Encounter for other general examination: Secondary | ICD-10-CM

## 2017-12-24 DIAGNOSIS — B20 Human immunodeficiency virus [HIV] disease: Secondary | ICD-10-CM | POA: Insufficient documentation

## 2017-12-24 DIAGNOSIS — Z741 Need for assistance with personal care: Secondary | ICD-10-CM

## 2017-12-24 LAB — MAGNESIUM: Magnesium: 2.1 mg/dL (ref 1.7–2.4)

## 2017-12-24 MED ORDER — CARVEDILOL 3.125 MG PO TABS: 3.1250 mg | ORAL_TABLET | Freq: Two times a day (BID) | ORAL | 0 refills | Status: DC

## 2017-12-24 MED ORDER — FLUCONAZOLE 200 MG PO TABS: 400.0000 mg | ORAL_TABLET | Freq: Every day | ORAL | 0 refills | Status: DC

## 2017-12-24 MED ORDER — SODIUM BICARBONATE 650 MG PO TABS: 1300.0000 mg | ORAL_TABLET | Freq: Two times a day (BID) | ORAL | 0 refills | Status: DC

## 2017-12-24 MED ORDER — AZITHROMYCIN 600 MG PO TABS: 1200.0000 mg | ORAL_TABLET | ORAL | 0 refills | Status: AC

## 2017-12-24 NOTE — Plan of Care (Signed)
  Progressing Clinical Measurements: Ability to maintain clinical measurements within normal limits will improve 12/24/2017 0932 - Progressing by Gwendlyn DeutscherStevens, Izaak Sahr E, RN Will remain free from infection 12/24/2017 0932 - Progressing by Gwendlyn DeutscherStevens, Deeanna Beightol E, RN Note PO abx/antifungals.  Diagnostic test results will improve 12/24/2017 0932 - Progressing by Gwendlyn DeutscherStevens, Jawad Wiacek E, RN Safety: Ability to remain free from injury will improve 12/24/2017 0932 - Progressing by Gwendlyn DeutscherStevens, Deangelo Berns E, RN

## 2017-12-24 NOTE — Consult Note (Signed)
[facility] Face-to-Face Psychiatry Consult   Reason for Consult:  Capacity evaluation  Referring Physician:  Dr. Sheran Fava Patient Identification: Kenneth Rivas MRN:  [id number] Principal Diagnosis: Evaluation by psychiatric service required Diagnosis:   Patient Active Problem List   Diagnosis Date Noted  . Abnormal echocardiogram [R93.1] 11/29/2017  . Cardiomyopathy [facility]): Per 2 d echo 11/28/2017 [I42.9] 11/29/2017  . Anemia [D64.9] 11/28/2017  . Near syncope [R55] 11/27/2017  . 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 Prolonged QT interval [R94.31] 11/26/2017  . Encounter for imaging study to confirm nasogastric (NG) tube placement [Z01.89]   . Meningitis [G03.9]   . Blurry vision, left eye [H53.8]   . Pressure injury of skin [L89.90] 06/26/2017  . Ptosis, left eyelid [H02.402]   . Acute respiratory failure with hypoxia ([place]) [J96.01]   . Cellulitis of right hand [L03.113] 06/23/2017  . 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 Lung abscess ([place]) [J85.2]   . Refeeding syndrome [E87.8] 06/18/2017  . Hypophosphatemia [E83.39] 06/18/2017  . 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 Protein-calorie malnutrition, severe [E43] 06/17/2017  . Abnormal chest x-ray [R93.89]   . AIDS (acquired immune deficiency syndrome) ([place]) [B20]   . Cavitary pneumonia [J18.9, J98.4]   . Weakness [R53.1] 06/16/2017  . 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 Hypokalemia [E87.6]   . Hyponatremia [E87.1]   . 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 HTN (hypertension) [I10] 08/17/2013  . Hepatitis B [B19.10]   . ELEVATED BLOOD PRESSURE [R03.0] 06/12/2010  . TB SKIN TEST, POSITIVE [795.5] 01/12/2009    Total Time spent with patient: 1 hour  Subjective:   Kenneth Rivas is a 57 y.o. 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  HPI:   Per chart review, patient is receiving treatment for persistent cryptococcal  meningitis due to poor medication compliance. He underwent reinduction therapy with IV amphoterician and Flucytosine. He has no insight about his medical condition and has not been oriented. He has generalized weakness, is bedridden and incontinent. He requires medication dosing multiple times per day. He is recommended to discharge to SNF.  On interview, Kenneth Rivas is unable to state his current medical condition or treatment that he is receiving. He reports, "I don't know" when asked. He does not appear to understand that physical therapy is necessary to improve strength. He perseverates on his roommate picking him up from the hospital. He denies SI, HI or AVH. He demonstrates great difficulty hearing this notewriter likely secondary to hearing loss. He does appear to understand what he is asked after repeating a question several times.   Past Psychiatric History: Denies   Risk to Self: Is patient at risk for suicide?: No Risk to Others:  None. Denies HI.  Prior Inpatient Therapy:  None Prior Outpatient Therapy:  None   Past Medical History:  Past Medical History:  Diagnosis Date  . AIDS ([place])   . Anemia   . Anxiety   . Cellulitis of right hand 06/23/2017  . Hepatitis B   . HIV disease ([place])   . Hypertension   . Immune deficiency disorder [facility])     Past Surgical History:  Procedure Laterality Date  . HERNIA REPAIR     Family History:  Family History  Problem Relation Age of Onset  . Hypertension Mother    Family Psychiatric  History: Unknown  Social History:  Social History   Substance and Sexual Activity  Alcohol Use Yes  . Alcohol/week:  2.0 oz  . Types: 4 Standard drinks or equivalent per week   Comment: beer     Social History   Substance and Sexual Activity  Drug Use Yes  . Types: Cocaine   Comment: crack- last use 03/2012. no h/o IVDU, other illicits    Social History   Socioeconomic History  . Marital status: Married    Spouse name: None  . Number of  children: None  . Years of education: None  . Highest education level: None  Social Needs  . Financial resource strain: None  . Food insecurity - worry: None  . Food insecurity - inability: None  . Transportation needs - medical: None  . Transportation needs - non-medical: None  Occupational History  . None  Tobacco Use  . Smoking status: Current Some Day Smoker    Packs/day: 0.30    Types: Cigarettes  . Smokeless tobacco: Never Used  . Tobacco comment: pt. given quit line information  Substance and Sexual Activity  . Alcohol use: Yes    Alcohol/week: 2.0 oz    Types: 4 Standard drinks or equivalent per week    Comment: beer  . Drug use: Yes    Types: Cocaine    Comment: crack- last use 03/2012. no h/o IVDU, other illicits  . Sexual activity: Yes    Partners: Male    Comment: patient declined condoms  Other Topics Concern  . None  Social History Narrative  . None   Additional Social History: He lives at home with a roommate.     Allergies:  No Known Allergies  Labs:  Results for orders placed or performed during the hospital encounter of 11/26/17 (from the past 48 hour(s))  Hemoglobin and hematocrit, blood     Status: Abnormal   Collection Time: 12/22/17  4:47 PM  Result Value Ref Range   Hemoglobin 7.6 (L) 13.0 - 17.0 g/dL   HCT 22.8 (L) 39.0 - 52.0 %    Comment: Performed at [facility], [place] [address] [place], [place] [address]  Magnesium     Status: Abnormal   Collection Time: 12/23/17  4:22 AM  Result Value Ref Range   Magnesium 1.6 (L) 1.7 - 2.4 mg/dL    Comment: Performed at [facility], [place] [address] [place], [place] [address]  CBC     Status: Abnormal   Collection Time: 12/23/17  4:22 AM  Result Value Ref Range   WBC 3.2 (L) 4.0 - 10.5 K/uL   RBC 2.98 (L) 4.22 - 5.81 MIL/uL   Hemoglobin 8.1 (L) 13.0 - 17.0 g/dL   HCT 23.7 (L) 39.0 - 52.0 %   MCV 79.5 78.0 - 100.0 fL   MCH 27.2 26.0 - 34.0 pg   MCHC 34.2  30.0 - 36.0 g/dL   RDW 15.2 11.5 - 15.5 %   Platelets 240 150 - 400 K/uL    Comment: Performed at [facility], [place] [address], [place], [place] [address]  Basic metabolic panel     Status: Abnormal   Collection Time: 12/23/17  4:22 AM  Result Value Ref Range   Sodium 131 (L) 135 - 145 mmol/L   Potassium 4.9 3.5 - 5.1 mmol/L   Chloride 110 101 - 111 mmol/L   CO2 16 (L) 22 - 32 mmol/L   Glucose, Bld 97 65 - 99 mg/dL   BUN 17 6 - 20 mg/dL   Creatinine, Ser 0.76 0.61 - 1.24 mg/dL   Calcium  8.6 (L) 8.9 - 10.3 mg/dL   GFR calc non Af Amer >60 >60 mL/min   GFR calc Af Amer >60 >60 mL/min    Comment: (NOTE) The eGFR has been calculated using the CKD EPI equation. This calculation has not been validated in all clinical situations. eGFR's persistently <60 mL/min signify possible Chronic Kidney Disease.    Anion gap 5 5 - 15    Comment: Performed at [facility], [place] [address] [place], [place] [address]  Magnesium     Status: None   Collection Time: 12/24/17  4:55 AM  Result Value Ref Range   Magnesium 2.1 1.7 - 2.4 mg/dL    Comment: Performed at [facility], [place] [address], [place], [place] [address]    Current Facility-Administered Medications  Medication Dose Route Frequency Provider Last Rate Last Dose  . acetaminophen (TYLENOL) tablet 650 mg  650 mg Oral Q6H PRN Elwin Mocha, MD   650 mg at 12/10/17 2103  . azithromycin (ZITHROMAX) tablet 1,200 mg  1,200 mg Oral Weekly Carlyle Basques, MD   1,200 mg at 12/22/17 0901  . carvedilol (COREG) tablet 3.125 mg  3.125 mg Oral BID WC Eugenie Filler, MD   3.125 mg at 12/24/17 0741  . docusate (COLACE) 50 MG/5ML liquid 25 mg  25 mg Both EARS BID Elwin Mocha, MD   25 mg at 12/24/17 1101  . enoxaparin (LOVENOX) injection 40 mg  40 mg Subcutaneous Q24H Royetta Asal, RPH   40 mg at 12/23/17 2136  . fluconazole (DIFLUCAN) tablet 400 mg  400 mg Oral Daily Michel Bickers, MD    400 mg at 12/24/17 1102  . hydrALAZINE (APRESOLINE) injection 10 mg  10 mg Intravenous Q8H PRN Elwin Mocha, MD   10 mg at 12/09/17 0554  . meperidine (DEMEROL) injection 25 mg  25 mg Intravenous Q15 min PRN Carlyle Basques, MD      . multivitamin with minerals tablet 1 tablet  1 tablet Oral Daily Eugenie Filler, MD   1 tablet at 12/24/17 1103  . sodium bicarbonate tablet 1,300 mg  1,300 mg Oral BID Janece Canterbury, MD   1,300 mg at 12/24/17 1101  . sodium chloride flush (NS) 0.9 % injection 3 mL  3 mL Intravenous Q12H Elwin Mocha, MD   3 mL at 12/23/17 2114  . sulfamethoxazole-trimethoprim (BACTRIM DS,SEPTRA DS) 800-160 MG per tablet 1 tablet  1 tablet Oral Daily Thurnell Lose, MD   1 tablet at 12/24/17 1103    Musculoskeletal: Strength & Muscle Tone: decreased due to physical deconditioning.  Gait & Station: UTA since patint was lying in bed. Patient leans: N/A  Psychiatric Specialty Exam: Physical Exam  Nursing note and vitals reviewed. Constitutional: He is oriented to person, place, and time. He appears well-developed.  Thin  HENT:  Head: Normocephalic and atraumatic.  Neck: Normal range of motion.  Respiratory: Effort normal.  Musculoskeletal: Normal range of motion.  Neurological: He is alert and oriented to person, place, and time.  Psychiatric: He has a normal mood and affect. His speech is normal and behavior is normal. Thought content normal. Cognition and memory are impaired.    Review of Systems  Unable to perform ROS: Other  Difficult to assess since patient has hearing loss. He does have generalized weakness.   Blood pressure 107/72, pulse 97, temperature 98.5 F (36.9 C), temperature source Oral, resp. rate 14, height _0  (1.676 m), weight 50.7 kg (111  lb 11.2 oz), SpO2 97 %.Body mass index is 18.03 kg/m.  General Appearance: Fairly Groomed, middle aged, African American male, wearing a hospital gown and lying in bed. NAD.   Eye Contact:  Good   Speech:  Clear and Coherent and Normal Rate  Volume:  Normal  Mood:  Euthymic  Affect:  Congruent  Thought Process:  Goal Directed and Linear  Orientation:  Full (Time, Place, and Person)  Thought Content:  Logical  Suicidal Thoughts:  No  Homicidal Thoughts:  No  Memory:  Immediate;   Fair Recent;   Fair Remote;   Fair  Judgement:  Poor  Insight:  Poor  Psychomotor Activity:  Normal  Concentration:  Concentration: Fair and Attention Span: Fair  Recall:  [employer] of Knowledge:  Poor  Language:  Fair  Akathisia:  No  Handed:  Right  AIMS (if indicated):   N/A  Assets:  Housing Social Support  ADL's:  Impaired  Cognition:  Impaired. He demonstrates difficulty comprehending information.   Sleep:   N/A   Assessment:  Kenneth Rivas is a 57 y.o. 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 He is unable to state his current medical condition or the treatment that he is receiving. He is unable to rationally manipulate information regarding his medical condition or his current treatment needs. He will need an authorized representative to help him make decisions regarding his current care.   Treatment Plan Summary: -Patient does not demonstrate capacity to make decisions regarding his medical condition. Please assign an authorized representative to help him make these decisions.  -Psychiatry will sign off patient at this time. Please consult psychiatry again as needed.   Disposition: No evidence of imminent risk to self or others at present.   Patient does not meet criteria for psychiatric inpatient admission.  Faythe Dingwall, DO 12/24/2017 11:30 AM

## 2017-12-24 NOTE — Progress Notes (Signed)
CSW contacted patient's roommate [name]([name] [phone]) and informed him that patient is ready for discharge and wants to return home. Patient's roommate confirmed that patient is able to return home and that he will be home all day to receive patient. CSW provided update to patient's RNCM. CSW signing off, no other needs identified at this time.   Celso SickleKimberly Furious Chiarelli, [place] Clinical Social Worker [facility] Cell#: [phone]

## 2017-12-24 NOTE — Progress Notes (Signed)
Soke with Clide CliffRicky [phone] to inform him that [facility] would call him to schedule time to come out to see pt. Clide CliffRicky states that he will be there and answer the telephone. Clide CliffRicky states that he is home for the pt to come back, waiting for nurse to call to tell him when pt is coming.

## 2017-12-24 NOTE — Care Management (Signed)
[facility] CM [facility] and [facility] and on arrival noted room-mate to be intoxicated unsafe home environment.  [facility] and EDP placed SW consult

## 2017-12-24 NOTE — Discharge Summary (Signed)
Physician Discharge Summary  Kenneth Rivas ZOX:[id number] DOB: 1961/11/15 DOA: 11/26/2017  PCP: Randall Hiss, MD  Admit date: 11/26/2017 Discharge date: 12/24/2017  Admitted From: home  Disposition:  home  Recommendations for Outpatient Follow-up:  1. Follow up with Dr. Daiva Eves in 1-2 weeks 2. Cardiology in 2 weeks regarding reduced ejection fraction  Home Health: RN, aide, PT, OT, social work Equipment/Devices: Biomedical scientist with wheelchair cushion  Discharge Condition:  Stable, improved CODE STATUS: Full code Diet recommendation: Regular diet  Brief/Interim Summary:  Kenneth Rivas is a 57 y.o.male with AIDS, last CD4 count less than 10, history of cryptococcal meningitis in 2018 (not compliant with antifungals) who was admitted with generalized weakness and a near syncopal event.  The patient was found to have persistent cryptococcal meningitis and underwent reinduction therapy with IV amphotericin and flucytosine.  He completed his flucytosine on the morning of 2/6 and has been transitioned to oral fluconazole.  He will need at least 8 weeks of fluconazole 400 mg daily before his dose is reduced and he will need to be seen in the infectious disease clinic prior to that reduction.  His course is also been complicated by acute kidney injury which was likely related to acyclovir which has since improved.  He has been started on prophylactic Bactrim and azithromycin for his AIDS.  He has not been started yet on antiretroviral therapy but this will be addressed as an outpatient by ID.  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 Tenny Craw as an outpatient for ongoing management.    Patient's care is complicated by his lack of insight into his medical problems.  He was deemed to not have capacity to make his medical decisions by psychiatry on 2/7.  He will be discharged home in care of his roommate Kenneth Rivas who is going to assist with ADLs and  medications.    Discharge Diagnoses:  Principal Problem:   Cryptococcal meningoencephalitis (HCC) Active Problems:   HTN (hypertension)   Cachexia associated with AIDS (HCC)   Hypokalemia   Hyponatremia   Protein-calorie malnutrition, severe   AIDS (acquired immune deficiency syndrome) (HCC)   Prolonged QT interval   Near syncope   Cryptococcal meningitis (HCC)   Anemia   Abnormal echocardiogram   Cardiomyopathy [facility]): Per 2 d echo 11/28/2017  Persistent cryptococcal meningitis S/p Lumbar puncture on 12/04/2017, 1/25 with cryptococcus.  Culture from 1/31 pending.  Encapsulated yeast seen on gram stain.  Completed reinduction therapy with IV amphotericin (increased to 4 mg/kg per ID) and flucytosine -  Fluconazole 400 mg daily times 8 weeks, day 1 on 2/6 -  Appreciate ID assistance  AIDS, CD4 10.  -  Restarted Bactrim for PCP prophylaxis -  Started azithromycin for MAI prophylaxis -  Per ID, will address antiretroviral therapy as outpatient  Hyponatremia-mild and asymptomatic  Acute kidney injury-creatinine trended down from 1.45 to 0.76 with IV fluids. Suspected related to acyclovir, improved with IV fluids.  -Continue to hold ACE inhibitor and Lasix   NAGMA, bicarb still 16 -  Increased sodium bicarb to 1300mg  BID, monitor for swelling in setting of reduced EF.  Near syncope- secondary to dehydration/orthostatic hypotension. QTC has normalized. 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 -  outpatient follow-up with cardiology.  Nonsustained Vtach: pt with 5 beat run vtach overnight 2/1-2/2, none in last 24 hours -  poor  candidate for invasive evaluation for HF or defibrillator therapy. Replete mg/K prn.   QT prolongation-500's on 1/10, improved to 460's on 1/13 Repeat EKG on 2/3 with QTc 437  Severe protein calorie malnutrition- continue  supplements  Hypertension- continue coreg. Stopped Lisinopril, Lasix  Anemia/leukopenia-H&H stable around 8.1g/dl.   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  Does not have capacity to make medical decisions according to psychiatry.  Care needs to be discussed with sister and roommate Kenneth Rivas.  Patient is incontinent and requires assistance for all ADLs.  He was encouraged to go to a skilled nursing facility for ongoing care but the patient and roommate declined.   Discharge Instructions  Discharge Instructions    (HEART FAILURE PATIENTS) Call MD:  Anytime you have any of the following symptoms: 1) 3 pound weight gain in 24 hours or 5 pounds in 1 week 2) shortness of breath, with or without a dry hacking cough 3) swelling in the hands, feet or stomach 4) if you have to sleep on extra pillows at night in order to breathe.   Complete by:  As directed    Call MD for:  difficulty breathing, headache or visual disturbances   Complete by:  As directed    Call MD for:  extreme fatigue   Complete by:  As directed    Call MD for:  persistant dizziness or light-headedness   Complete by:  As directed    Call MD for:  persistant nausea and vomiting   Complete by:  As directed    Call MD for:  severe uncontrolled pain   Complete by:  As directed    Call MD for:  temperature >100.4   Complete by:  As directed    Diet general   Complete by:  As directed    Increase activity slowly   Complete by:  As directed      Allergies as of 12/24/2017   No Known Allergies     Medication List    STOP taking these medications   amLODipine 10 MG tablet Commonly known as:  NORVASC   feeding supplement (ENSURE ENLIVE) Liqd     TAKE these medications   azithromycin 600 MG tablet Commonly known as:  ZITHROMAX Take 2 tablets (1,200 mg total) by mouth once a week. Start taking on:  12/29/2017   carvedilol 3.125 MG tablet Commonly known as:  COREG Take 1 tablet (3.125 mg total) by mouth 2 (two) times daily  with a meal.   fluconazole 200 MG tablet Commonly known as:  DIFLUCAN Take 2 tablets (400 mg total) by mouth daily. What changed:    how much to take  additional instructions   ibuprofen 200 MG tablet Commonly known as:  ADVIL,MOTRIN Take 200-600 mg by mouth every 6 (six) hours as needed.   polyvinyl alcohol 1.4 % ophthalmic solution Commonly known as:  LIQUIFILM TEARS Place 1 drop into both eyes at bedtime.   sodium bicarbonate 650 MG tablet Take 2 tablets (1,300 mg total) by mouth 2 (two) times daily.   sulfamethoxazole-trimethoprim 800-160 MG tablet Commonly known as:  BACTRIM DS,SEPTRA DS Take 1 tablet by mouth daily.            Durable Medical Equipment  (From admission, onward)        Start     Ordered   12/23/17 1102  For home use only DME standard manual wheelchair with seat cushion  Once    Comments:  Patient suffers from HIV/AIDS, cryptococcal meningitis, generalized  weakness which impairs their ability to perform daily activities like bathing, dressing and toileting in the home.  A walker will not resolve  issue with performing activities of daily living. A wheelchair will allow patient to safely perform daily activities. Patient can safely propel the wheelchair in the home or has a caregiver who can provide assistance.  Accessories: cushion, wheel locks, extensions and anti-tippers.   12/23/17 1102     Follow-up Information    Daiva EvesVan Dam, Lisette Grinderornelius N, MD. Schedule an appointment as soon as possible for a visit in 2 week(s).   Specialty:  Infectious Diseases Contact information: [address] [place] [address] [phone]        Pricilla Riffleoss, Paula V, MD. Schedule an appointment as soon as possible for a visit in 1 week(s).   Specialty:  Cardiology Contact information: [address] [place] [address] [phone]          No Known Allergies  Consultations:  ID  Cardiology   Procedures/Studies: Dg Chest 2  View  Result Date: 11/26/2017 CLINICAL DATA:  Weakness EXAM: CHEST  2 VIEW COMPARISON:  07/11/2017 chest radiograph. FINDINGS: Stable cardiomediastinal silhouette with normal heart size. No pneumothorax. No pleural effusion. Lungs appear clear, with no acute consolidative airspace disease and no pulmonary edema. IMPRESSION: No active cardiopulmonary disease. Electronically Signed   By: Delbert PhenixJason A Poff M.D.   On: 11/26/2017 17:27   Ct Head Wo Contrast  Result Date: 11/26/2017 CLINICAL DATA:  Syncopal episodes, generalized weakness, focal neural deficit of greater than 6 hours, history of HIV, cryptococcal meningitis, hypertension EXAM: CT HEAD WITHOUT CONTRAST TECHNIQUE: Contiguous axial images were obtained from the base of the skull through the vertex without intravenous contrast. Sagittal and coronal MPR images reconstructed from axial data set. COMPARISON:  06/24/2017 FINDINGS: Brain: Exam degraded by patient motion. Generalized atrophy. Normal ventricular morphology. No midline shift or mass effect. Scattered areas of white matter hypoattenuation particularly frontal regions. No intracranial hemorrhage, mass lesion, or evidence acute infarction. No extra-axial fluid collections. Vascular: Unremarkable Skull: Unremarkable Sinuses/Orbits: Clear Other: N/A IMPRESSION: White matter hypoattenuation in the frontal regions greater on RIGHT similar prior exam. No new intracranial abnormalities. Electronically Signed   By: Ulyses SouthwardMark  Boles M.D.   On: 11/26/2017 17:05   Dg Fluoro Guide Lumbar Puncture  Result Date: 12/17/2017 CLINICAL DATA:  Cryptococcal meningoencephalitis EXAM: DIAGNOSTIC LUMBAR PUNCTURE UNDER FLUOROSCOPIC GUIDANCE FLUOROSCOPY TIME:  Fluoroscopy Time:  0 minutes, 36 seconds Radiation Exposure Index (if provided by the fluoroscopic device): 14.3 mGy Number of Acquired Spot Images: 0 PROCEDURE: I discussed the risks (including hemorrhage, infection, headache, and nerve damage, among others), benefits,  and alternatives to fluoroscopically guided lumbar puncture by telephone with the patient's sister, who is guiding medical decision-making on behalf of the patient due to his altered mental status. We specifically discussed the high technical likelihood of success of the procedure. She understood and elected for the patient to undergo the procedure. Standard time-out was employed. Following sterile skin prep and local anesthetic administration consisting of 1 percent lidocaine, a 22 gauge spinal needle was advanced without difficulty into the thecal sac at the at the L2-3 level. Clear CSF was returned. Opening pressure was 17 cm of water. 10 cc of clear CSF was collected. The CSF return was very sluggish. The needle was subsequently removed and the skin cleansed and bandaged. No immediate complications were observed. IMPRESSION: 1. Technically successful fluoroscopically guided lumbar puncture at the L2-3 level. Opening pressure was 17  cm of water. Electronically Signed   By: Gaylyn Rong M.D.   On: 12/17/2017 16:27   Dg Fluoro Guide Lumbar Puncture  Result Date: 12/11/2017 CLINICAL DATA:  Cryptococcal meningitis EXAM: DIAGNOSTIC LUMBAR PUNCTURE UNDER FLUOROSCOPIC GUIDANCE FLUOROSCOPY TIME:  Fluoroscopy Time:  45 sec Radiation Exposure Index (if provided by the fluoroscopic device): 11.41 mGy Number of Acquired Spot Images: 0 PROCEDURE: Informed consent was obtained from the patient prior to the procedure, including potential complications of headache, allergy, and pain. With the patient prone, the lower back was prepped with Betadine. 1% Lidocaine was used for local anesthesia. Lumbar puncture was performed at the L3-4 level using a 20 gauge needle with return of clear CSF with an opening pressure of 38 cm water. Eight ml of CSF were obtained for laboratory studies. The patient tolerated the procedure well and there were no apparent complications. IMPRESSION: Successful fluoroscopic guided lumbar puncture  as above. Electronically Signed   By: Charlett Nose M.D.   On: 12/11/2017 15:05   Dg Fluoro Guide Lumbar Puncture  Result Date: 12/04/2017 CLINICAL DATA:  AIDS.  Cryptococcal meningitis. EXAM: DIAGNOSTIC LUMBAR PUNCTURE UNDER FLUOROSCOPIC GUIDANCE FLUOROSCOPY TIME:  Fluoroscopy Time: 36 sec of low-dose pulsed fluoro Radiation Exposure Index (if provided by the fluoroscopic device): 1.85 mGy Number of Acquired Spot Images: 0 PROCEDURE: Informed witnessed consent was obtained from the patient's sister, Sharin Mons, by telephone prior to the procedure, including potential complications of headache, allergy, and pain. With the patient prone, the lower back was prepped with Betadine. 1% Lidocaine was used for local anesthesia. Lumbar puncture was performed at the L3-4 level on the right using a 20 gauge needle with return of slightly amber tinged CSF with an opening pressure of 12 cm water. 6 ml of CSF were obtained for laboratory studies. The patient tolerated the procedure well and there were no apparent complications. IMPRESSION: Diagnostic lumbar puncture performed without immediate complications. Electronically Signed   By: Carey Bullocks M.D.   On: 12/04/2017 11:15   Dg Fluoro Guide Lumbar Puncture  Result Date: 11/27/2017 CLINICAL DATA:  AIDS.  Evaluate for cryptococcal meningitis. EXAM: DIAGNOSTIC LUMBAR PUNCTURE UNDER FLUOROSCOPIC GUIDANCE FLUOROSCOPY TIME:  Fluoroscopy Time:  1 minute 14 seconds Radiation Exposure Index (if provided by the fluoroscopic device): 10 mGy Number of Acquired Spot Images: 1 PROCEDURE: Informed consent was obtained from the patient prior to the procedure, including potential complications of headache, allergy, and pain. With the patient prone, the lower back was prepped with Betadine. 1% Lidocaine was used for local anesthesia. Lumbar puncture was performed at the L2-3 level using a 20 gauge needle with return of clear CSF with an opening pressure of 36 cm water. 9.5 ml of  CSF were obtained for laboratory studies. The patient tolerated the procedure well and there were no apparent complications. IMPRESSION: 1. Successful lumbar puncture at L2-3. 2. Opening pressure of 36 cm water.  9.5 cc of CSF collected. Electronically Signed   By: Signa Kell M.D.   On: 11/27/2017 15:43    LP on 1/18 and 1/25 and 1/31  Subjective:  Denies blurry vision, headache, focal numbness or weakness.  Wants to go home.  Discharge Exam: Vitals:   12/24/17 0913 12/24/17 1335  BP: 107/72 122/79  Pulse: 97 84  Resp:  18  Temp:  98.9 F (37.2 C)  SpO2: 97% 100%   Vitals:   12/24/17 0549 12/24/17 0740 12/24/17 0913 12/24/17 1335  BP: 121/65 118/80 107/72 122/79  Pulse: 76 95  97 84  Resp: 16 14  18   Temp: 98.5 F (36.9 C)   98.9 F (37.2 C)  TempSrc: Oral   Oral  SpO2: 97% 99% 97% 100%  Weight: 50.7 kg (111 lb 11.2 oz)     Height:        General: Pt is alert, awake, not in acute distress, disconjugate gaze  cardiovascular: RRR, S1/S2 +, no rubs, no gallops Respiratory: CTA bilaterally, no wheezing, no rhonchi Abdominal: Soft, NT, ND, bowel sounds + Extremities: no edema, no cyanosis    The results of significant diagnostics from this hospitalization (including imaging, microbiology, ancillary and laboratory) are listed below for reference.     Microbiology: Recent Results (from the past 240 hour(s))  Culture, fungus without smear     Status: None (Preliminary result)   Collection Time: 12/17/17  4:25 PM  Result Value Ref Range Status   Specimen Description   Final    CSF Performed at [facility], [address], [place], [place] [address]    Special Requests   Final    NONE Performed at [facility], [address], [place], [place] [address]    Culture   Final    NO FUNGUS ISOLATED AFTER 6 DAYS Performed at [facility], [address], [place], [place] [address]    Report Status PENDING  Incomplete   Fungus Culture With Stain     Status: None (Preliminary result)   Collection Time: 12/17/17  4:25 PM  Result Value Ref Range Status   Fungus Stain Final report  Final    Comment: (NOTE) Performed At: [facility] [address] [place], [place] [id number] Jolene Schimke MD OZ:[id number]    Fungus (Mycology) Culture PENDING  Incomplete   Fungal Source CSF  Final    Comment: Performed at [facility], [address], [place], [place] [address]  Fungus Culture Result     Status: None   Collection Time: 12/17/17  4:25 PM  Result Value Ref Range Status   Result 1 Comment  Final    Comment: (NOTE) KOH/Calcofluor preparation:  no fungus observed. Performed At: [facility] [address] [place], [place] [id number] Jolene Schimke MD UU:[id number] Performed at [facility], [address] [place], [place] [address]   CSF culture     Status: None   Collection Time: 12/17/17  4:56 PM  Result Value Ref Range Status   Specimen Description   Final    CSF Performed at [facility], [address], [place], [place] [address]    Special Requests   Final    NONE Performed at [facility], [address], [place], [place] [address]    Gram Stain   Final    WBC PRESENT, PREDOMINANTLY MONONUCLEAR NO ORGANISMS SEEN CYTOSPIN Gram Stain Report Called to,Read Back By and Verified With: JESSICA DEUTSCH,RN [id number] @ 1830 BY J SCOTTON ENCAPSULATED YEAST SEEN    Culture   Final    NO GROWTH 3 DAYS Performed at [facility], [address], [place], [place] [address]    Report Status 12/21/2017 FINAL  Final     Labs: BNP (last 3 results) No results for input(s): BNP in the last 8760 hours. Basic Metabolic Panel: Recent Labs  Lab 12/19/17 0459 12/20/17 0454 12/21/17 0541 12/22/17 0500 12/23/17 0422 12/24/17 0455  NA 129* 129* 129* 130* 131*  --   K 4.9 4.9 4.8 5.0  4.9  --   CL 107 108  110 108 110  --   CO2 15* 15* 15* 16* 16*  --   GLUCOSE 84 102* 106* 98 97  --   BUN 17 20 19 17 17   --   CREATININE 0.84 0.75 0.83 0.86 0.76  --   CALCIUM 8.5* 8.5* 8.5* 8.6* 8.6*  --   MG 1.7 2.4 1.5* 1.7 1.6* 2.1   Liver Function Tests: No results for input(s): AST, ALT, ALKPHOS, BILITOT, PROT, ALBUMIN in the last 168 hours. No results for input(s): LIPASE, AMYLASE in the last 168 hours. No results for input(s): AMMONIA in the last 168 hours. CBC: Recent Labs  Lab 12/19/17 0459 12/20/17 0454 12/21/17 0541 12/22/17 0500 12/22/17 1647 12/23/17 0422  WBC 3.5* 3.5* 3.6* 3.1*  --  3.2*  HGB 8.7* 8.3* 8.5* 7.9* 7.6* 8.1*  HCT 25.9* 24.2* 24.5* 23.6* 22.8* 23.7*  MCV 79.9 79.6 79.5 80.5  --  79.5  PLT 217 225 230 243  --  240   Cardiac Enzymes: No results for input(s): CKTOTAL, CKMB, CKMBINDEX, TROPONINI in the last 168 hours. BNP: Invalid input(s): POCBNP CBG: No results for input(s): GLUCAP in the last 168 hours. D-Dimer No results for input(s): DDIMER in the last 72 hours. Hgb A1c No results for input(s): HGBA1C in the last 72 hours. Lipid Profile No results for input(s): CHOL, HDL, LDLCALC, TRIG, CHOLHDL, LDLDIRECT in the last 72 hours. Thyroid function studies No results for input(s): TSH, T4TOTAL, T3FREE, THYROIDAB in the last 72 hours.  Invalid input(s): FREET3 Anemia work up No results for input(s): VITAMINB12, FOLATE, FERRITIN, TIBC, IRON, RETICCTPCT in the last 72 hours. Urinalysis    Component Value Date/Time   COLORURINE YELLOW 11/30/2017 1150   APPEARANCEUR CLEAR 11/30/2017 1150   LABSPEC 1.009 11/30/2017 1150   PHURINE 8.0 11/30/2017 1150   GLUCOSEU 50 (A) 11/30/2017 1150   HGBUR NEGATIVE 11/30/2017 1150   BILIRUBINUR NEGATIVE 11/30/2017 1150   KETONESUR NEGATIVE 11/30/2017 1150   PROTEINUR NEGATIVE 11/30/2017 1150   NITRITE NEGATIVE 11/30/2017 1150   LEUKOCYTESUR NEGATIVE 11/30/2017 1150   Sepsis Labs Invalid input(s): PROCALCITONIN,  WBC,   LACTICIDVEN   Time coordinating discharge: Over 30 minutes  SIGNED:   Renae Fickle, MD  [facility] 12/24/2017, 3:16 PM Pager   If 7PM-7AM, please contact night-coverage [email or web address] Password [email or web address]

## 2017-12-24 NOTE — Progress Notes (Addendum)
CSW provided information to CM CSW gathered [facility] and EMS.  Per [facility] ED AD, pt was transported home and pt could not be allowed to remain by EMS because pt's medical equipment that was to be delivered to pt's home was not there and pt was en route back to the ED.  Per ED AD the pt had originally been [facility].    EMS stated pt's roommate was intoxicated and pt's intoxicated roommate had stated equipment had not been delivered.  CSW called CM and informed CM of missing equipment.  9:39 PM CM called CSW back and stated [facility] and [facility] had clarified situation.  Per PTAR, pt was on second floor and PTAR had not been notified and was not aware but had transported pt to pt's second floor apartment anyway.  Per PTAR pt's roommate was intoxicated and PTAR felt this was not a safe situation for the pt and that pt must return to the ED until this could be remedied and pt had a safe situation to come home to.  Per PTAR, pt's roommate had stated pt's needed equipment had not arrived.  Per CM, [facility] informed CM that the equipment delivery had been attempted but no one had answered the door when the equipment had arrived.  Per CM pt's safe home situation being documented is a social work issue and that CM will notify [facility] ED CM for 2/8 that equipment needs to be documented as delivered before pt can be D/C'd on 2/8.  Per CM, PTAR stated PTAR cannot transport pt for patient's safety, until pt's needed medical equipment is delivered and pt's home situation is unsafe due to pt's roommate's intoxication at time of pt arriving.  CSW will attempt to contact family on 2/7.  CSW will continue to follow for D/C needs.  CSW called pt's sister and requested pt's sister be able to be present when pt arrives home and also when pt's medical equipment arrives and pt's sister stated, "No, I can't do that because my brother lives in a rooming house with fifteen stairs"  and pt's sister walks with a walker and is disabled at 7962 because of a "bad back".    Per pt's sister pt lives in one room in a rooming house, with Clide CliffRicky his roommate and has lived there for fifteen years.  Per pt's sister the pt's roommate "has been living that life" when asked about the pt's roommate's drinking.   Pt's sister states she is the only person in the family with a stable home, houses many of her younger relatives and has no room for the pt with her.  Per pt's sister pt is "sick" and unable to care for himself and his roommate can't help him either.    CSW called pt's roommate Leafy RoRicky Pratt at ph: [phone] and informed Mr. Shawnie Ponsratt why pt was not allowed to remain at home today after D/C.  CSW explained that when medical equipment had arrived no one had answered the door and that when the pt was transported home Mr. Shawnie Ponsratt was intoxicated.  CSW explained that when pt is transported to pt's home on 2/8 Mr. Shawnie Ponsratt can not be intoxicated and that in addition Mr. Shawnie Ponsratt must be present when the medical equipment is delivered and that the pt cannot be transported home until equipment has arrived.  CSW will leave handoff for daytime CSW.   Dorothe PeaJonathan F. Dyane Broberg, LCSW, LCAS, CSI  Clinical Social Worker Ph: [phone]

## 2017-12-24 NOTE — ED Triage Notes (Signed)
Per PTAR, Pt was discharged was 4th floor earlier this evening.  When they arrived at his home, his caregiver was intoxicated and none of his home health equipment(walker and wheelchair) had not arrived.  Also, his residency is on the 2nd floor and PTAR was told it was a 1st floor apartment.    Pt is A & O x self only.

## 2017-12-24 NOTE — Progress Notes (Signed)
   Patient Details Name: Kenneth Rivas MRN: [id number] DOB: [birth year]     Durable Medical Equipment  (From admission, onward)        Start     Ordered   12/23/17 1102  For home use only DME standard manual wheelchair with seat cushion  Once    Comments:  Patient suffers from HIV/AIDS, cryptococcal meningitis, generalized weakness which impairs their ability to perform daily activities like bathing, dressing and toileting in the home.  A walker will not resolve  issue with performing activities of daily living. A wheelchair will allow patient to safely perform daily activities. Patient can safely propel the wheelchair in the home or has a caregiver who can provide assistance.  Accessories: cushion, wheel locks, extensions and anti-tippers.   12/23/17 1102       Minal Stuller,KATHrine E 12/24/2017, 2:14 PM Zenovia JarredKati Ginnie Marich, PT, DPT 12/24/2017 Pager: [phone]

## 2017-12-24 NOTE — Progress Notes (Signed)
[facility] called to transport pt home at this time. [name], roommate, aware.

## 2017-12-24 NOTE — Care Management (Addendum)
[facility] ED CM contacted [facility] ED CSW concerning [facility] transport returning patient to ED. As per [facility] roommate's  was intoxicated, and patient was also to receive w/c delivery today but, as per [facility] equipment was not at the home.  [facility] and [facility] CM will follow up in the am with safe transitional care plan and wheel chair.

## 2017-12-24 NOTE — Progress Notes (Signed)
D/C instructions reviewed in detail by phone w/ both pt's partner, Jaclynn GuarneriRicky [name] and pt's sister, Sharin Monseresa Moore. Both verbalize understanding and all questions answered. Clide CliffRicky states he will p/u scripts [facility] [address] today.

## 2017-12-24 NOTE — Progress Notes (Signed)
Patient ID: Kenneth Rivas, male   DOB: 10/21/1961, 57 y.o.   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  However his functional status remains extremely poor.  He is going to be discharged back to his boarding house today with his roommate, [name].  His nurse has arranged [facility] which he can [facility] up this afternoon.  Dwain Sarna,  the bridge counselor from our clinic, is aware of his discharge, and will follow up with him soon.  PLAN: 1. Continue fluconazole 2. I will arrange follow-up in our clinic very soon  Principal Problem:   Evaluation by psychiatric service required Active Problems:   AIDS (acquired immune deficiency syndrome) (HCC)   Cryptococcal meningoencephalitis (HCC)   HTN (hypertension)   Cachexia associated with AIDS (HCC)   Hypokalemia   Hyponatremia   Protein-calorie malnutrition, severe   Prolonged QT interval   Near syncope   Cryptococcal meningitis (HCC)   Anemia   Abnormal echocardiogram   Cardiomyopathy [facility]): Per 2 d echo 11/28/2017   Scheduled Meds: . azithromycin  1,200 mg Oral Weekly  . carvedilol  3.125 mg Oral BID WC  . docusate  25 mg Both EARS BID  . enoxaparin (LOVENOX) injection  40 mg Subcutaneous Q24H  . fluconazole  400 mg Oral Daily  . multivitamin with minerals  1 tablet Oral Daily  . sodium bicarbonate  1,300 mg Oral BID  . sodium chloride flush  3 mL Intravenous Q12H  . sulfamethoxazole-trimethoprim  1 tablet Oral Daily   Continuous Infusions:  PRN Meds:.acetaminophen **OR** [DISCONTINUED] acetaminophen, hydrALAZINE, meperidine (DEMEROL) injection   SUBJECTIVE: He denies headache.  Review of Systems: Review  of Systems  Unable to perform ROS: Mental acuity    No Known Allergies  OBJECTIVE: Vitals:   12/24/17 0549 12/24/17 0740 12/24/17 0913 12/24/17 1335  BP: 121/65 118/80 107/72 122/79  Pulse: 76 95 97 84  Resp: 16 14  18   Temp: 98.5 F (36.9 C)   98.9 F (37.2 C)  TempSrc: Oral   Oral  SpO2: 97% 99% 97% 100%  Weight: 111 lb 11.2 oz (50.7 kg)     Height:       Body mass index is 18.03 kg/m.  Physical Exam  Constitutional: He is oriented to person, place, and time.  He is slumped over in bed with his eyes closed as he has been every day and I have seen him.  He seems to be somewhat hard of hearing.  He he is in no distress.  Eyes: Conjunctivae are normal.  Neck: Neck supple.  Cardiovascular: Normal rate and regular rhythm.  No murmur heard. Pulmonary/Chest: Effort normal and breath sounds normal.  Abdominal: Soft. He exhibits no distension. There is no tenderness.  Neurological: He is alert and oriented to person, place, and time.  Skin: No rash noted.  Psychiatric: Mood and affect normal.    Lab Results Lab Results  Component Value Date   WBC 3.2 (L) 12/23/2017   HGB 8.1 (L) 12/23/2017   HCT 23.7 (L) 12/23/2017   MCV 79.5 12/23/2017   PLT 240  12/23/2017    Lab Results  Component Value Date   CREATININE 0.76 12/23/2017   BUN 17 12/23/2017   NA 131 (L) 12/23/2017   K 4.9 12/23/2017   CL 110 12/23/2017   CO2 16 (L) 12/23/2017    Lab Results  Component Value Date   ALT 102 (H) 12/07/2017   AST 57 (H) 12/07/2017   ALKPHOS 104 12/07/2017   BILITOT 0.4 12/07/2017     Microbiology: Recent Results (from the past 240 hour(s))  Culture, fungus without smear     Status: None (Preliminary result)   Collection Time: 12/17/17  4:25 PM  Result Value Ref Range Status   Specimen Description   Final    CSF Performed at [facility], [address] [place], [place] [address]    Special Requests   Final    NONE Performed at [facility], [address] [place], [place] [address]    Culture   Final    NO FUNGUS ISOLATED AFTER 6 DAYS Performed at [facility], [address] [place], [place] [address]    Report Status PENDING  Incomplete  Fungus Culture With Stain     Status: None (Preliminary result)   Collection Time: 12/17/17  4:25 PM  Result Value Ref Range Status   Fungus Stain Final report  Final    Comment: (NOTE) Performed At: [facility] [address] [place], [place] [id number] Jolene SchimkeNagendra Sanjai MD OZ:[phone]    Fungus (Mycology) Culture PENDING  Incomplete   Fungal Source CSF  Final    Comment: Performed at [facility], [address] [place], [place] [address]  Fungus Culture Result     Status: None   Collection Time: 12/17/17  4:25 PM  Result Value Ref Range Status   Result 1 Comment  Final    Comment: (NOTE) KOH/Calcofluor preparation:  no fungus observed. Performed At: [facility] [place] [address] [place], [place] [id number] Jolene SchimkeNagendra Sanjai MD UU:[phone] Performed at [facility], [address] [place], [place] [address]   CSF culture     Status: None   Collection Time: 12/17/17  4:56 PM  Result Value Ref Range Status   Specimen Description   Final    CSF Performed at [facility], [address] [place], [place] [address]    Special Requests   Final    NONE Performed at [facility], [address] [place], [place] [address]    Gram Stain   Final    WBC PRESENT, PREDOMINANTLY MONONUCLEAR NO ORGANISMS SEEN CYTOSPIN Gram Stain Report Called to,Read Back By and Verified With: JESSICA DEUTSCH,RN [id number] @ 1830 BY J SCOTTON ENCAPSULATED YEAST SEEN    Culture   Final    NO GROWTH 3 DAYS Performed at [facility], [address] [place], [place] [address]    Report Status 12/21/2017 FINAL  Final    Cliffton AstersJohn Shiara Mcgough, MD [facility] [phone] pager   [phone] cell 12/24/2017, 3:34 PM

## 2017-12-25 MED ORDER — IBUPROFEN 200 MG PO TABS: 200.0000 mg | ORAL_TABLET | Freq: Four times a day (QID) | ORAL | Status: DC | PRN

## 2017-12-25 MED ORDER — SULFAMETHOXAZOLE-TRIMETHOPRIM 800-160 MG PO TABS: 1.0000 | ORAL_TABLET | Freq: Every day | ORAL | Status: DC

## 2017-12-25 MED ORDER — SODIUM BICARBONATE 650 MG PO TABS
1300.0000 mg | ORAL_TABLET | Freq: Two times a day (BID) | ORAL | Status: DC
  Administered 2017-12-25: 1300 mg via ORAL
  Filled 2017-12-25 (×2): qty 2

## 2017-12-25 MED ORDER — FLUCONAZOLE 200 MG PO TABS
400.0000 mg | ORAL_TABLET | Freq: Every day | ORAL | Status: DC
  Filled 2017-12-25: qty 2

## 2017-12-25 MED ORDER — CARVEDILOL 3.125 MG PO TABS
3.1250 mg | ORAL_TABLET | Freq: Two times a day (BID) | ORAL | Status: DC
  Filled 2017-12-25: qty 1

## 2017-12-25 NOTE — Discharge Planning (Signed)
[facility] DME delivery to home.  EDCM contacted [facility] liaison to find that DME may be delivered today as someone will have to be there to accept it.  Another option is [facility] [address].  EDCM relayed information to EDSW.  No further EDCM needs identified at this time.  Kourosh Jablonsky J. Lucretia RoersWood, RN, BSN, [place] [phone]

## 2017-12-25 NOTE — ED Notes (Signed)
PTAR AND THIS WRITER SEARCHED FOR BELONGINGS. NO BELONGINGS FOUND

## 2017-12-25 NOTE — ED Notes (Signed)
[facility] CSW SPEAKING WITH [facility]. ROOMMATE STATED TO CSW HE WILL BE THERE TODAY. WHEELCHAIR WILL BE DELIVERED AT UNKNOWN TIME HOWEVER IS NOT LIVE SHAVING. PT HAS WALKER.

## 2017-12-25 NOTE — ED Notes (Signed)
[facility] SOCIAL WORK CALLED PT[facility]. SHE STATES PLEASE CALL HER BEFORE DISCHARGING THIS PT

## 2017-12-25 NOTE — ED Provider Notes (Addendum)
[facility]PT Provider Note   CSN: [id number] Arrival date & time: 12/24/17  1825     History   Chief Complaint Chief Complaint  Patient presents with  . Social Work Issue    HPI Kenneth Rivas is a 57 y.o. male.  Patient discharged home today after a prolonged hospitalization here.  Patient was admitted January 10.  Complicated hospital course.  Patient is known to have AIDS.  History of cryptococcal meningitis.  Also had fungal infections.  Patient was followed by infectious disease.  Was to follow-up again in 2 weeks.  Cardiology in 2 weeks.  Patient's condition was stable at discharge home.  Patient supposedly had caregiver at home and was going to need a wheelchair with wheelchair cushion.  Patient went home by Kenneth SheetsP Rivas.  When they got there patient's wheelchair was not there.  Also a walker was not there.  And patient's caregiver was extremely intoxicated.  So Kenneth Kenneth [place] brought him back.      Past Medical History:  Diagnosis Date  . AIDS (HCC)   . Anemia   . Anxiety   . Cellulitis of right hand 06/23/2017  . Hepatitis B   . HIV disease (HCC)   . Hypertension   . Immune deficiency disorder [facility](HCC)     Patient Active Problem List   Diagnosis Date Noted  . Evaluation by psychiatric service required 12/24/2017  . Abnormal echocardiogram 11/29/2017  . Cardiomyopathy [facility](HCC): Per 2 d echo 11/28/2017 11/29/2017  . Anemia 11/28/2017  . Near syncope 11/27/2017  . 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 Prolonged QT interval 11/26/2017  . Encounter for imaging study to confirm nasogastric (NG) tube placement   . Meningitis   . Blurry vision, left eye   . Pressure injury of skin 06/26/2017  . Ptosis, left eyelid   . Acute respiratory failure with hypoxia (HCC)   . Cellulitis of right hand 06/23/2017  . Cryptococcal meningoencephalitis (HCC)   . Lung abscess (HCC)   . Refeeding syndrome 06/18/2017  . Hypophosphatemia 06/18/2017  .  Cryptococcosis (HCC)   . Protein-calorie malnutrition, severe 06/17/2017  . Abnormal chest x-ray   . AIDS (acquired immune deficiency syndrome) (HCC)   . Cavitary pneumonia   . Weakness 06/16/2017  . Cachexia associated with AIDS (HCC)   . Hypokalemia   . Hyponatremia   . 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 HTN (hypertension) 08/17/2013  . Hepatitis B   . ELEVATED BLOOD PRESSURE 06/12/2010  . TB SKIN TEST, POSITIVE 01/12/2009    Past Surgical History:  Procedure Laterality Date  . HERNIA REPAIR         Home Medications    Prior to Admission medications   Medication Sig Start Date End Date Taking? Authorizing Provider  azithromycin (ZITHROMAX) 600 MG tablet Take 2 tablets (1,200 mg total) by mouth once a week. 12/29/17 01/28/18  Kenneth Rivas, Mackenzie, MD  carvedilol (COREG) 3.125 MG tablet Take 1 tablet (3.125 mg total) by mouth 2 (two) times daily with a meal. 12/24/17   Kenneth Rivas, Mackenzie, MD  fluconazole (DIFLUCAN) 200 MG tablet Take 2 tablets (400 mg total) by mouth daily. 12/24/17 01/23/18  Kenneth Rivas, Mackenzie, MD  ibuprofen (ADVIL,MOTRIN) 200 MG tablet Take 200-600 mg by mouth every 6 (six) hours as needed.    [provider]  polyvinyl alcohol (LIQUIFILM TEARS) 1.4 % ophthalmic solution Place 1 drop into both eyes at bedtime. Patient not taking: Reported on 09/09/2017 07/14/17   Shirley, [place]az[place], DO  sodium bicarbonate  650 MG tablet Take 2 tablets (1,300 mg total) by mouth 2 (two) times daily. 12/24/17 01/23/18  Kenneth Fickle, MD  sulfamethoxazole-trimethoprim (BACTRIM DS,SEPTRA DS) 800-160 MG tablet Take 1 tablet by mouth daily. 12/24/17 01/23/18  Kenneth Fickle, MD    Family History Family History  Problem Relation Age of Onset  . Hypertension Mother     Social History Social History   Tobacco Use  . Smoking status: Current Some Day Smoker    Packs/day: 0.30    Types: Cigarettes  . Smokeless tobacco: Never Used  . Tobacco comment: pt. given quit line information    Substance Use Topics  . Alcohol use: Yes    Alcohol/week: 2.0 oz    Types: 4 Standard drinks or equivalent per week    Comment: beer  . Drug use: Yes    Types: Cocaine    Comment: crack- last use 03/2012. no h/o IVDU, other illicits     Allergies   Patient has no known allergies.   Review of Systems Review of Systems  Unable to perform ROS: Dementia     Physical Exam Updated Vital Signs BP 122/82 (BP Location: Right Arm)   Pulse 80   Temp 98.6 F (37 C) (Oral)   Resp 18   SpO2 95%   Physical Exam  Constitutional: No distress.  Patient then.  HENT:  Head: Normocephalic.  Eyes: Conjunctivae are normal.  Neck: Neck supple.  Cardiovascular: Normal rate.  Pulmonary/Chest: Effort normal.  Abdominal: Soft. There is no tenderness.  Musculoskeletal: Normal range of motion.  Neurological: He is alert.  Patient seems to have some dementia.  Known to have an encephalopathy.  Patient able to eat.  Skin: Skin is warm.  Nursing note and vitals reviewed.    ED Treatments / Results  Labs (all labs ordered are listed, but only abnormal results are displayed) Labs Reviewed - No data to display  EKG  EKG Interpretation None       Radiology No results found.  Procedures Procedures (including critical care time)  Medications Ordered in ED Medications - No data to display   Initial Impression / Assessment and Plan / ED Course  I have reviewed the triage vital signs and the nursing notes.  Pertinent labs & imaging results that were available during my care of the patient were reviewed by me and considered in my medical decision making (see chart for details).    Patient seen by the Child psychotherapist.  He is can have care management get involved in the morning so patient will be here overnight.  Clearly the home environment was not safe.  Patient seems to be baseline and currently stable.  No vital sign abnormalities.  No indication for labs.  Patient discharge  summary reviewed from his recent hospitalization   Final Clinical Impressions(s) / ED Diagnoses   Final diagnoses:  Medical services not available in home    ED Discharge Orders    None       Vanetta Mulders, MD 12/25/17 Danford Bad, MD 12/25/17 [phone]

## 2017-12-25 NOTE — Progress Notes (Addendum)
CM informed that pt returned to ED. Pt and [name](roommate) was aware of DME/WC being sent to home related to pt transporting VIA [facility]. [facility] will follow pt at home.

## 2017-12-25 NOTE — Discharge Instructions (Signed)
Followup with your doctor as needed

## 2017-12-25 NOTE — Progress Notes (Signed)
[facility] with no answer to deliver WC.

## 2018-01-01 LAB — CULTURE, FUNGUS WITHOUT SMEAR

## 2018-01-01 LAB — FUNGAL ORGANISM REFLEX

## 2018-01-01 LAB — FUNGUS CULTURE RESULT

## 2018-01-04 ENCOUNTER — Other Ambulatory Visit: Payer: Self-pay

## 2018-01-04 ENCOUNTER — Emergency Department (HOSPITAL_COMMUNITY): Payer: Medicaid Other

## 2018-01-04 ENCOUNTER — Inpatient Hospital Stay (HOSPITAL_COMMUNITY): Payer: Medicaid Other

## 2018-01-04 ENCOUNTER — Encounter (HOSPITAL_COMMUNITY): Payer: Self-pay | Admitting: Emergency Medicine

## 2018-01-04 ENCOUNTER — Inpatient Hospital Stay (HOSPITAL_COMMUNITY)
Admission: EM | Admit: 2018-01-04 | Discharge: 2018-01-15 | DRG: 974 | Disposition: A | Discharge status: Skilled Nursing Facility | Payer: Medicaid Other | Attending: Internal Medicine | Admitting: Internal Medicine

## 2018-01-04 ENCOUNTER — Inpatient Hospital Stay: Payer: Self-pay | Admitting: Infectious Disease

## 2018-01-04 DIAGNOSIS — N179 Acute kidney failure, unspecified: Secondary | ICD-10-CM

## 2018-01-04 DIAGNOSIS — Z91128 Patient's intentional underdosing of medication regimen for other reason: Secondary | ICD-10-CM

## 2018-01-04 DIAGNOSIS — L8990 Pressure ulcer of unspecified site, unspecified stage: Secondary | ICD-10-CM

## 2018-01-04 DIAGNOSIS — E870 Hyperosmolality and hypernatremia: Secondary | ICD-10-CM | POA: Diagnosis present

## 2018-01-04 DIAGNOSIS — E860 Dehydration: Secondary | ICD-10-CM | POA: Diagnosis present

## 2018-01-04 DIAGNOSIS — E43 Unspecified severe protein-calorie malnutrition: Secondary | ICD-10-CM | POA: Diagnosis present

## 2018-01-04 DIAGNOSIS — D649 Anemia, unspecified: Secondary | ICD-10-CM | POA: Diagnosis present

## 2018-01-04 DIAGNOSIS — J9600 Acute respiratory failure, unspecified whether with hypoxia or hypercapnia: Secondary | ICD-10-CM

## 2018-01-04 DIAGNOSIS — E878 Other disorders of electrolyte and fluid balance, not elsewhere classified: Secondary | ICD-10-CM | POA: Diagnosis present

## 2018-01-04 DIAGNOSIS — F17210 Nicotine dependence, cigarettes, uncomplicated: Secondary | ICD-10-CM | POA: Diagnosis present

## 2018-01-04 DIAGNOSIS — T378X5A Adverse effect of other specified systemic anti-infectives and antiparasitics, initial encounter: Secondary | ICD-10-CM | POA: Diagnosis present

## 2018-01-04 DIAGNOSIS — R109 Unspecified abdominal pain: Secondary | ICD-10-CM

## 2018-01-04 DIAGNOSIS — D696 Thrombocytopenia, unspecified: Secondary | ICD-10-CM | POA: Diagnosis not present

## 2018-01-04 DIAGNOSIS — B20 Human immunodeficiency virus [HIV] disease: Secondary | ICD-10-CM | POA: Diagnosis present

## 2018-01-04 DIAGNOSIS — E872 Acidosis: Secondary | ICD-10-CM | POA: Diagnosis present

## 2018-01-04 DIAGNOSIS — J9601 Acute respiratory failure with hypoxia: Secondary | ICD-10-CM | POA: Diagnosis present

## 2018-01-04 DIAGNOSIS — Z8249 Family history of ischemic heart disease and other diseases of the circulatory system: Secondary | ICD-10-CM | POA: Diagnosis not present

## 2018-01-04 DIAGNOSIS — R627 Adult failure to thrive: Secondary | ICD-10-CM | POA: Diagnosis present

## 2018-01-04 DIAGNOSIS — R6521 Severe sepsis with septic shock: Secondary | ICD-10-CM

## 2018-01-04 DIAGNOSIS — G9340 Encephalopathy, unspecified: Secondary | ICD-10-CM | POA: Diagnosis not present

## 2018-01-04 DIAGNOSIS — Z21 Asymptomatic human immunodeficiency virus [HIV] infection status: Secondary | ICD-10-CM | POA: Diagnosis not present

## 2018-01-04 DIAGNOSIS — Z681 Body mass index (BMI) 19 or less, adult: Secondary | ICD-10-CM | POA: Diagnosis not present

## 2018-01-04 DIAGNOSIS — Y92009 Unspecified place in unspecified non-institutional (private) residence as the place of occurrence of the external cause: Secondary | ICD-10-CM | POA: Diagnosis not present

## 2018-01-04 DIAGNOSIS — E875 Hyperkalemia: Secondary | ICD-10-CM

## 2018-01-04 DIAGNOSIS — D6959 Other secondary thrombocytopenia: Secondary | ICD-10-CM | POA: Diagnosis present

## 2018-01-04 DIAGNOSIS — G9341 Metabolic encephalopathy: Secondary | ICD-10-CM | POA: Diagnosis present

## 2018-01-04 DIAGNOSIS — I10 Essential (primary) hypertension: Secondary | ICD-10-CM | POA: Diagnosis present

## 2018-01-04 DIAGNOSIS — Z9911 Dependence on respirator [ventilator] status: Secondary | ICD-10-CM | POA: Diagnosis not present

## 2018-01-04 DIAGNOSIS — N289 Disorder of kidney and ureter, unspecified: Secondary | ICD-10-CM | POA: Diagnosis not present

## 2018-01-04 DIAGNOSIS — G929 Unspecified toxic encephalopathy: Secondary | ICD-10-CM

## 2018-01-04 DIAGNOSIS — Z9114 Patient's other noncompliance with medication regimen: Secondary | ICD-10-CM

## 2018-01-04 DIAGNOSIS — R64 Cachexia: Secondary | ICD-10-CM | POA: Diagnosis present

## 2018-01-04 DIAGNOSIS — B451 Cerebral cryptococcosis: Secondary | ICD-10-CM | POA: Diagnosis present

## 2018-01-04 DIAGNOSIS — Z01818 Encounter for other preprocedural examination: Secondary | ICD-10-CM

## 2018-01-04 DIAGNOSIS — Z91148 Patient's other noncompliance with medication regimen for other reason: Secondary | ICD-10-CM

## 2018-01-04 DIAGNOSIS — G92 Toxic encephalopathy: Secondary | ICD-10-CM

## 2018-01-04 DIAGNOSIS — A419 Sepsis, unspecified organism: Secondary | ICD-10-CM | POA: Diagnosis present

## 2018-01-04 DIAGNOSIS — I429 Cardiomyopathy, unspecified: Secondary | ICD-10-CM | POA: Diagnosis present

## 2018-01-04 DIAGNOSIS — N3000 Acute cystitis without hematuria: Secondary | ICD-10-CM | POA: Diagnosis not present

## 2018-01-04 DIAGNOSIS — Z8661 Personal history of infections of the central nervous system: Secondary | ICD-10-CM | POA: Diagnosis not present

## 2018-01-04 DIAGNOSIS — R509 Fever, unspecified: Secondary | ICD-10-CM | POA: Diagnosis not present

## 2018-01-04 DIAGNOSIS — T375X6A Underdosing of antiviral drugs, initial encounter: Secondary | ICD-10-CM | POA: Diagnosis present

## 2018-01-04 DIAGNOSIS — R739 Hyperglycemia, unspecified: Secondary | ICD-10-CM | POA: Diagnosis present

## 2018-01-04 HISTORY — DX: Disorder of kidney and ureter, unspecified: N28.9

## 2018-01-04 LAB — COMPREHENSIVE METABOLIC PANEL
ALT: 19 U/L (ref 17–63)
AST: 35 U/L (ref 15–41)
Albumin: 3 g/dL — ABNORMAL LOW (ref 3.5–5.0)
Alkaline Phosphatase: 129 U/L — ABNORMAL HIGH (ref 38–126)
Anion gap: 23 — ABNORMAL HIGH (ref 5–15)
BUN: 148 mg/dL — ABNORMAL HIGH (ref 6–20)
CO2: 8 mmol/L — ABNORMAL LOW (ref 22–32)
Calcium: 9.7 mg/dL (ref 8.9–10.3)
Chloride: 116 mmol/L — ABNORMAL HIGH (ref 101–111)
Creatinine, Ser: 6.3 mg/dL — ABNORMAL HIGH (ref 0.61–1.24)
GFR calc Af Amer: 10 mL/min — ABNORMAL LOW (ref >60)
GFR calc non Af Amer: 9 mL/min — ABNORMAL LOW (ref >60)
Glucose, Bld: 225 mg/dL — ABNORMAL HIGH (ref 65–99)
Potassium: 6.1 mmol/L — ABNORMAL HIGH (ref 3.5–5.1)
Sodium: 147 mmol/L — ABNORMAL HIGH (ref 135–145)
Total Bilirubin: 0.7 mg/dL (ref 0.3–1.2)
Total Protein: 9.9 g/dL — ABNORMAL HIGH (ref 6.5–8.1)

## 2018-01-04 LAB — BLOOD GAS, ARTERIAL
ACID-BASE DEFICIT: 14.1 mmol/L — AB (ref 0.0–2.0)
Acid-base deficit: 22.4 mmol/L — ABNORMAL HIGH (ref 0.0–2.0)
Acid-base deficit: 15.5 mmol/L — ABNORMAL HIGH (ref 0.0–2.0)
BICARBONATE: 11.4 mmol/L — AB (ref 20.0–28.0)
Bicarbonate: 6.7 mmol/L — ABNORMAL LOW (ref 20.0–28.0)
Bicarbonate: 10.6 mmol/L — ABNORMAL LOW (ref 20.0–28.0)
DRAWN BY: 225631
Drawn by: 441261
Drawn by: 441261
FIO2: 100
FIO2: 40
FIO2: 40
MECHVT: 520 mL
MECHVT: 520 mL
O2 Saturation: 98.5 %
O2 Saturation: 98.4 %
O2 Saturation: 98.6 %
PEEP: 5 cmH2O
PEEP: 5 cmH2O
PEEP: 5 cmH2O
PH ART: 7.289 — AB (ref 7.350–7.450)
Patient temperature: 98.6
Patient temperature: 32.8
Patient temperature: 95.2
RATE: 20 resp/min
RATE: 20 resp/min
RATE: 20 resp/min
VT: 0.52 mL
pCO2 arterial: 24.5 mmHg — ABNORMAL LOW (ref 32.0–48.0)
pCO2 arterial: 21.4 mmHg — ABNORMAL LOW (ref 32.0–48.0)
pCO2 arterial: 23.8 mmHg — ABNORMAL LOW (ref 32.0–48.0)
pH, Arterial: 7.062 — CL (ref 7.350–7.450)
pH, Arterial: 7.285 — ABNORMAL LOW (ref 7.350–7.450)
pO2, Arterial: 221 mmHg — ABNORMAL HIGH (ref 83.0–108.0)
pO2, Arterial: 183 mmHg — ABNORMAL HIGH (ref 83.0–108.0)

## 2018-01-04 LAB — URINALYSIS, ROUTINE W REFLEX MICROSCOPIC
Bilirubin Urine: NEGATIVE
Glucose, UA: NEGATIVE mg/dL
Ketones, ur: NEGATIVE mg/dL
Nitrite: NEGATIVE
Protein, ur: 100 mg/dL — AB
Specific Gravity, Urine: 1.017 (ref 1.005–1.030)
pH: 7 (ref 5.0–8.0)

## 2018-01-04 LAB — TROPONIN I: Troponin I: 0.05 ng/mL (ref <0.03)

## 2018-01-04 LAB — PROTIME-INR
INR: 1.31
Prothrombin Time: 16.2 seconds — ABNORMAL HIGH (ref 11.4–15.2)

## 2018-01-04 LAB — GLUCOSE, CAPILLARY: Glucose-Capillary: 186 mg/dL — ABNORMAL HIGH (ref 65–99)

## 2018-01-04 LAB — CBC WITH DIFFERENTIAL/PLATELET
Basophils Absolute: 0 10*3/uL (ref 0.0–0.1)
Basophils Relative: 0 %
Eosinophils Absolute: 0 10*3/uL (ref 0.0–0.7)
Eosinophils Relative: 0 %
HCT: 38.3 % — ABNORMAL LOW (ref 39.0–52.0)
Hemoglobin: 12.6 g/dL — ABNORMAL LOW (ref 13.0–17.0)
Lymphocytes Relative: 9 %
Lymphs Abs: 0.9 10*3/uL (ref 0.7–4.0)
MCH: 27.8 pg (ref 26.0–34.0)
MCHC: 32.9 g/dL (ref 30.0–36.0)
MCV: 84.5 fL (ref 78.0–100.0)
Monocytes Absolute: 0.9 10*3/uL (ref 0.1–1.0)
Monocytes Relative: 9 %
Neutro Abs: 8.5 10*3/uL — ABNORMAL HIGH (ref 1.7–7.7)
Neutrophils Relative %: 82 %
Platelets: 321 10*3/uL (ref 150–400)
RBC: 4.53 MIL/uL (ref 4.22–5.81)
RDW: 15.5 % (ref 11.5–15.5)
WBC: 10.3 10*3/uL (ref 4.0–10.5)

## 2018-01-04 LAB — PROCALCITONIN: Procalcitonin: 3.68 ng/mL

## 2018-01-04 LAB — MRSA PCR SCREENING: MRSA BY PCR: NEGATIVE

## 2018-01-04 LAB — BASIC METABOLIC PANEL
ANION GAP: 15 (ref 5–15)
BUN: 122 mg/dL — ABNORMAL HIGH (ref 6–20)
CALCIUM: 8.3 mg/dL — AB (ref 8.9–10.3)
CO2: 12 mmol/L — ABNORMAL LOW (ref 22–32)
CREATININE: 5.33 mg/dL — AB (ref 0.61–1.24)
Chloride: 121 mmol/L — ABNORMAL HIGH (ref 101–111)
GFR, EST AFRICAN AMERICAN: 12 mL/min — AB (ref >60)
GFR, EST NON AFRICAN AMERICAN: 11 mL/min — AB (ref >60)
Glucose, Bld: 351 mg/dL — ABNORMAL HIGH (ref 65–99)
Potassium: 5.1 mmol/L (ref 3.5–5.1)
SODIUM: 148 mmol/L — AB (ref 135–145)

## 2018-01-04 LAB — CORTISOL: Cortisol, Plasma: 24.4 ug/dL

## 2018-01-04 LAB — MAGNESIUM
Magnesium: 3.5 mg/dL — ABNORMAL HIGH (ref 1.7–2.4)
Magnesium: 2.7 mg/dL — ABNORMAL HIGH (ref 1.7–2.4)

## 2018-01-04 LAB — RAPID URINE DRUG SCREEN, HOSP PERFORMED
Amphetamines: NOT DETECTED
Barbiturates: NOT DETECTED
Benzodiazepines: NOT DETECTED
COCAINE: NOT DETECTED
Opiates: NOT DETECTED
TETRAHYDROCANNABINOL: NOT DETECTED

## 2018-01-04 LAB — TRIGLYCERIDES: TRIGLYCERIDES: 214 mg/dL — AB (ref <150)

## 2018-01-04 LAB — I-STAT CG4 LACTIC ACID, ED
Lactic Acid, Venous: 9.16 mmol/L (ref 0.5–1.9)
Lactic Acid, Venous: 3.82 mmol/L (ref 0.5–1.9)

## 2018-01-04 LAB — CBG MONITORING, ED: GLUCOSE-CAPILLARY: 320 mg/dL — AB (ref 65–99)

## 2018-01-04 LAB — LACTIC ACID, PLASMA: LACTIC ACID, VENOUS: 3.2 mmol/L — AB (ref 0.5–1.9)

## 2018-01-04 LAB — PHOSPHORUS: PHOSPHORUS: 9.6 mg/dL — AB (ref 2.5–4.6)

## 2018-01-04 MED ORDER — INSULIN ASPART 100 UNIT/ML ~~LOC~~ SOLN
10.0000 [IU] | Freq: Once | SUBCUTANEOUS | Status: AC
  Administered 2018-01-04: 10 [IU] via INTRAVENOUS
  Filled 2018-01-04: qty 1

## 2018-01-04 MED ORDER — LIDOCAINE HCL (PF) 1 % IJ SOLN
5.0000 mL | Freq: Once | INTRAMUSCULAR | Status: AC
Start: 2018-01-04 — End: 2018-01-04
  Administered 2018-01-04: 5 mL

## 2018-01-04 MED ORDER — DEXTROSE 50 % IV SOLN
50.0000 mL | Freq: Once | INTRAVENOUS | Status: AC
  Administered 2018-01-04: 50 mL via INTRAVENOUS
  Filled 2018-01-04: qty 50

## 2018-01-04 MED ORDER — SODIUM CHLORIDE 0.9% FLUSH
10.0000 mL | Freq: Two times a day (BID) | INTRAVENOUS | Status: DC
  Administered 2018-01-04 – 2018-01-06 (×3): 10 mL

## 2018-01-04 MED ORDER — PIPERACILLIN-TAZOBACTAM 3.375 G IVPB 30 MIN
3.3750 g | Freq: Once | INTRAVENOUS | Status: AC
  Administered 2018-01-04: 3.375 g via INTRAVENOUS
  Filled 2018-01-04: qty 50

## 2018-01-04 MED ORDER — HEPARIN SODIUM (PORCINE) 5000 UNIT/ML IJ SOLN
5000.0000 [IU] | Freq: Three times a day (TID) | INTRAMUSCULAR | Status: DC
  Administered 2018-01-04 – 2018-01-06 (×5): 5000 [IU] via SUBCUTANEOUS
  Filled 2018-01-04 (×5): qty 1

## 2018-01-04 MED ORDER — PIPERACILLIN-TAZOBACTAM 3.375 G IVPB 30 MIN: 3.3750 g | Freq: Once | INTRAVENOUS | Status: DC

## 2018-01-04 MED ORDER — MIDAZOLAM HCL 2 MG/2ML IJ SOLN
1.0000 mg | INTRAMUSCULAR | Status: DC | PRN
  Filled 2018-01-04: qty 2

## 2018-01-04 MED ORDER — SODIUM CHLORIDE 0.9% FLUSH: 10.0000 mL | INTRAVENOUS | Status: DC | PRN

## 2018-01-04 MED ORDER — SODIUM CHLORIDE 0.9 % IV SOLN: 250.0000 mL | INTRAVENOUS | Status: DC | PRN

## 2018-01-04 MED ORDER — STERILE WATER FOR INJECTION IV SOLN
INTRAVENOUS | Status: AC
  Administered 2018-01-04 – 2018-01-05 (×2): via INTRAVENOUS
  Filled 2018-01-04 (×3): qty 850

## 2018-01-04 MED ORDER — FENTANYL CITRATE (PF) 100 MCG/2ML IJ SOLN: 100.0000 ug | INTRAMUSCULAR | Status: DC | PRN

## 2018-01-04 MED ORDER — PIPERACILLIN-TAZOBACTAM IN DEX 2-0.25 GM/50ML IV SOLN
2.2500 g | Freq: Three times a day (TID) | INTRAVENOUS | Status: DC
  Administered 2018-01-05 – 2018-01-07 (×8): 2.25 g via INTRAVENOUS
  Filled 2018-01-04 (×9): qty 50

## 2018-01-04 MED ORDER — ETOMIDATE 2 MG/ML IV SOLN
14.0000 mg | Freq: Once | INTRAVENOUS | Status: AC
  Administered 2018-01-04: 14 mg via INTRAVENOUS

## 2018-01-04 MED ORDER — DOCUSATE SODIUM 50 MG/5ML PO LIQD
100.0000 mg | Freq: Two times a day (BID) | ORAL | Status: DC
  Administered 2018-01-04 – 2018-01-05 (×3): 100 mg
  Filled 2018-01-04 (×3): qty 10

## 2018-01-04 MED ORDER — ORAL CARE MOUTH RINSE
15.0000 mL | Freq: Four times a day (QID) | OROMUCOSAL | Status: DC
  Administered 2018-01-04 – 2018-01-06 (×6): 15 mL via OROMUCOSAL

## 2018-01-04 MED ORDER — FREE WATER
100.0000 mL | Status: DC
  Administered 2018-01-04 – 2018-01-06 (×10): 100 mL

## 2018-01-04 MED ORDER — VANCOMYCIN HCL IN DEXTROSE 1-5 GM/200ML-% IV SOLN
1000.0000 mg | Freq: Once | INTRAVENOUS | Status: AC
  Administered 2018-01-04: 1000 mg via INTRAVENOUS
  Filled 2018-01-04: qty 200

## 2018-01-04 MED ORDER — INSULIN ASPART 100 UNIT/ML ~~LOC~~ SOLN
1.0000 [IU] | SUBCUTANEOUS | Status: DC
  Administered 2018-01-04: 2 [IU] via SUBCUTANEOUS
  Administered 2018-01-07 (×2): 1 [IU] via SUBCUTANEOUS
  Administered 2018-01-08 – 2018-01-09 (×2): 2 [IU] via SUBCUTANEOUS
  Administered 2018-01-09: 1 [IU] via SUBCUTANEOUS
  Administered 2018-01-09: 2 [IU] via SUBCUTANEOUS
  Administered 2018-01-09: 1 [IU] via SUBCUTANEOUS
  Administered 2018-01-10: 2 [IU] via SUBCUTANEOUS
  Administered 2018-01-11 – 2018-01-13 (×3): 1 [IU] via SUBCUTANEOUS
  Administered 2018-01-13: 2 [IU] via SUBCUTANEOUS
  Administered 2018-01-14: 1 [IU] via SUBCUTANEOUS

## 2018-01-04 MED ORDER — SODIUM BICARBONATE 8.4 % IV SOLN
50.0000 meq | Freq: Once | INTRAVENOUS | Status: AC
  Administered 2018-01-04: 50 meq via INTRAVENOUS
  Filled 2018-01-04: qty 50

## 2018-01-04 MED ORDER — PROPOFOL 1000 MG/100ML IV EMUL
0.0000 ug/kg/min | INTRAVENOUS | Status: DC
  Administered 2018-01-05: 30 ug/kg/min via INTRAVENOUS
  Administered 2018-01-05: 20 ug/kg/min via INTRAVENOUS
  Administered 2018-01-06: 30 ug/kg/min via INTRAVENOUS
  Filled 2018-01-04 (×3): qty 100

## 2018-01-04 MED ORDER — FLUCONAZOLE 40 MG/ML PO SUSR
400.0000 mg | Freq: Once | ORAL | Status: AC
  Administered 2018-01-04: 400 mg
  Filled 2018-01-04: qty 10

## 2018-01-04 MED ORDER — PROPOFOL 1000 MG/100ML IV EMUL
5.0000 ug/kg/min | Freq: Once | INTRAVENOUS | Status: AC
  Administered 2018-01-04: 5 ug/kg/min via INTRAVENOUS
  Filled 2018-01-04: qty 100

## 2018-01-04 MED ORDER — ACETAMINOPHEN 325 MG PO TABS: 650.0000 mg | ORAL_TABLET | ORAL | Status: DC | PRN

## 2018-01-04 MED ORDER — ROCURONIUM BROMIDE 50 MG/5ML IV SOLN
50.0000 mg | Freq: Once | INTRAVENOUS | Status: AC
  Administered 2018-01-04: 50 mg via INTRAVENOUS

## 2018-01-04 MED ORDER — FENTANYL CITRATE (PF) 100 MCG/2ML IJ SOLN
100.0000 ug | INTRAMUSCULAR | Status: DC | PRN
  Administered 2018-01-06: 100 ug via INTRAVENOUS
  Filled 2018-01-04: qty 2

## 2018-01-04 MED ORDER — ONDANSETRON HCL 4 MG/2ML IJ SOLN: 4.0000 mg | Freq: Four times a day (QID) | INTRAMUSCULAR | Status: DC | PRN

## 2018-01-04 MED ORDER — SODIUM CHLORIDE 0.9 % IV BOLUS (SEPSIS)
1000.0000 mL | Freq: Once | INTRAVENOUS | Status: AC
  Administered 2018-01-04: 1000 mL via INTRAVENOUS

## 2018-01-04 MED ORDER — CHLORHEXIDINE GLUCONATE 0.12% ORAL RINSE (MEDLINE KIT)
15.0000 mL | Freq: Two times a day (BID) | OROMUCOSAL | Status: DC
  Administered 2018-01-04 – 2018-01-06 (×4): 15 mL via OROMUCOSAL

## 2018-01-04 MED ORDER — SODIUM CHLORIDE 0.9 % IV BOLUS (SEPSIS)
500.0000 mL | Freq: Once | INTRAVENOUS | Status: AC
  Administered 2018-01-04: 500 mL via INTRAVENOUS

## 2018-01-04 MED ORDER — SODIUM CHLORIDE 0.9 % IV SOLN
1.0000 g | Freq: Once | INTRAVENOUS | Status: AC
  Administered 2018-01-04: 1 g via INTRAVENOUS
  Filled 2018-01-04: qty 10

## 2018-01-04 MED ORDER — VANCOMYCIN HCL IN DEXTROSE 1-5 GM/200ML-% IV SOLN: 1000.0000 mg | Freq: Once | INTRAVENOUS | Status: DC

## 2018-01-04 NOTE — Progress Notes (Signed)
Pt here to icu 1223 on ventilator, pt has propofol and sodium bicarb infusing thru TLC in R femoral, no sign of bleeding or hematoma, opt wiped with CHG wipes at this time and connected to our monitor, he is tachycardic in low 100s with bp elevated diastolic, propofol infusing at 30 mcg at this time, pt is on 40% fio2 on ventilator and breathing 24 times a minute. Pt had small skin tears on the right buttocks possibly from malnutrition, placed a foam pad on this location as well the sacrum and all other bony prominences on his back. Pt has a temp foley with temp 94.9 and placed back on the bear hugger at this time. Will cont to monitor this pt closely

## 2018-01-04 NOTE — ED Notes (Signed)
Kenneth ChapelRicky Rivas, brother, [phone]. Call if their is an emergency.

## 2018-01-04 NOTE — ED Provider Notes (Signed)
[facility]-EMERGENCY DEPT Provider Note   CSN: [id number] Arrival date & time: 01/04/18  1523     History   Chief Complaint Chief Complaint  Patient presents with  . Weakness    HPI Kenneth Rivas is a 57 y.o. male.  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 He completed his flucytosine on 2/6 and  transitioned to oral fluconazole. DC plan for 8 weeks of fluconazole 400 mg daily before his dose  reduced and to be seen in ID clinic.  SNF was recommended but he refused. Apparently went to a boarding house with a roommate. His roommate reports he hasn't been taking meds. Social work was involved again today. Apparently home health agency never opened a case of him after discharge. Psychiatry evaluated him during this past admission and did not feel he had decision making capacity. Apparently he still dose not have a POA designated by [facility] though.   He has had progressive decline since discharge. When I evalutaed him in triage he was nonverbal. He would make brief eye contact but not answering questioning or following commands.     Past Medical History:  Diagnosis Date  . AIDS (HCC)   . Anemia   . Anxiety   . Cellulitis of right hand 06/23/2017  . Hepatitis B   . HIV disease (HCC)   . Hypertension   . Immune deficiency disorder [facility])     Patient Active Problem List   Diagnosis Date Noted  . Evaluation by psychiatric service required 12/24/2017  . Abnormal echocardiogram 11/29/2017  . Cardiomyopathy [facility]): Per 2 d echo 11/28/2017 11/29/2017  . Anemia 11/28/2017  . Near syncope 11/27/2017  . 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 Prolonged QT interval 11/26/2017  . Encounter for imaging study to  confirm nasogastric (NG) tube placement   . Meningitis   . Blurry vision, left eye   . Pressure injury of skin 06/26/2017  . Ptosis, left eyelid   . Acute respiratory failure with hypoxia (HCC)   . Cellulitis of right hand 06/23/2017  . Cryptococcal meningoencephalitis (HCC)   . Lung abscess (HCC)   . Refeeding syndrome 06/18/2017  . Hypophosphatemia 06/18/2017  . Cryptococcosis (HCC)   . Protein-calorie malnutrition, severe 06/17/2017  . Abnormal chest x-ray   . AIDS (acquired immune deficiency syndrome) (HCC)   . Cavitary pneumonia   . Weakness 06/16/2017  . Cachexia associated with AIDS (HCC)   . Hypokalemia   . Hyponatremia   . 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 HTN (hypertension) 08/17/2013  . Hepatitis B   . ELEVATED BLOOD PRESSURE 06/12/2010  . TB SKIN TEST, POSITIVE 01/12/2009    Past Surgical History:  Procedure Laterality Date  . HERNIA REPAIR         Home Medications    Prior to Admission medications   Medication Sig Start Date End Date Taking? Authorizing Provider  azithromycin (ZITHROMAX) 600 MG tablet Take 2 tablets (1,200 mg total) by mouth once a week. 12/29/17 01/28/18  Renae Fickle, MD  carvedilol (COREG) 3.125 MG tablet Take 1 tablet (3.125 mg total) by mouth 2 (two) times daily with a meal. 12/24/17   Renae Fickle, MD  fluconazole (DIFLUCAN) 200 MG tablet Take 2 tablets (400 mg total) by mouth  daily. 12/24/17 01/23/18  Renae FickleShort, Mackenzie, MD  ibuprofen (ADVIL,MOTRIN) 200 MG tablet Take 200-600 mg by mouth every 6 (six) hours as needed.    [provider]  polyvinyl alcohol (LIQUIFILM TEARS) 1.4 % ophthalmic solution Place 1 drop into both eyes at bedtime. Patient not taking: Reported on 09/09/2017 07/14/17   Shirley, [place], DO  sodium bicarbonate 650 MG tablet Take 2 tablets (1,300 mg total) by mouth 2 (two) times daily. 12/24/17 01/23/18  Renae FickleShort, Mackenzie, MD  sulfamethoxazole-trimethoprim (BACTRIM DS,SEPTRA DS) 800-160 MG tablet Take 1  tablet by mouth daily. 12/24/17 01/23/18  Renae FickleShort, Mackenzie, MD    Family History Family History  Problem Relation Age of Onset  . Hypertension Mother     Social History Social History   Tobacco Use  . Smoking status: Current Some Day Smoker    Packs/day: 0.30    Types: Cigarettes  . Smokeless tobacco: Never Used  . Tobacco comment: pt. given quit line information  Substance Use Topics  . Alcohol use: Yes    Alcohol/week: 2.0 oz    Types: 4 Standard drinks or equivalent per week    Comment: beer  . Drug use: Yes    Types: Cocaine    Comment: crack- last use 03/2012. no h/o IVDU, other illicits     Allergies   Patient has no known allergies.   Review of Systems Review of Systems  Level 5 caveat because pt unresponsive.  Physical Exam Updated Vital Signs BP (!) 143/109   Pulse (!) 109   Resp 20   Physical Exam  Constitutional:  Toxic appearance. Cachectic. Disheveled appearance.   HENT:  Head: Normocephalic and atraumatic.  Very dry mucus membranes  Eyes: Pupils are equal, round, and reactive to light. Right eye exhibits no discharge. Left eye exhibits no discharge.  Neck: Neck supple.  Cardiovascular: Exam reveals no gallop and no friction rub.  No murmur heard. tachcyardic  Pulmonary/Chest:  Tachypnea. Accessory muscle usage. Breath sounds clear/symmetric.  Abdominal: Soft. He exhibits no distension. There is no tenderness.  Musculoskeletal: He exhibits no edema or tenderness.  Neurological:  Laying with eyes open. Will make eye contact. Nonerbal. Not following commands. Withdrawals all extremities to pain.   Skin: Skin is dry.  Cool to touch  Nursing note and vitals reviewed.    ED Treatments / Results  Labs (all labs ordered are listed, but only abnormal results are displayed) Labs Reviewed  COMPREHENSIVE METABOLIC PANEL - Abnormal; Notable for the following components:      Result Value   Sodium 147 (*)    Potassium 6.1 (*)    Chloride 116 (*)      CO2 8 (*)    Glucose, Bld 225 (*)    Creatinine, Ser 6.30 (*)    Total Protein 9.9 (*)    Albumin 3.0 (*)    Alkaline Phosphatase 129 (*)    GFR calc non Af Amer 9 (*)    GFR calc Af Amer 10 (*)    Anion gap 23 (*)    All other components within normal limits  CBC WITH DIFFERENTIAL/PLATELET - Abnormal; Notable for the following components:   Hemoglobin 12.6 (*)    HCT 38.3 (*)    Neutro Abs 8.5 (*)    All other components within normal limits  PROTIME-INR - Abnormal; Notable for the following components:   Prothrombin Time 16.2 (*)    All other components within normal limits  MAGNESIUM - Abnormal; Notable for the following components:  Magnesium 3.5 (*)    All other components within normal limits  TROPONIN I - Abnormal; Notable for the following components:   Troponin I 0.05 (*)    All other components within normal limits  BLOOD GAS, ARTERIAL - Abnormal; Notable for the following components:   pH, Arterial 7.062 (*)    pCO2 arterial 24.5 (*)    Bicarbonate 6.7 (*)    Acid-base deficit 22.4 (*)    All other components within normal limits  I-STAT CG4 LACTIC ACID, ED - Abnormal; Notable for the following components:   Lactic Acid, Venous 9.16 (*)    All other components within normal limits  CULTURE, BLOOD (ROUTINE X 2)  CULTURE, BLOOD (ROUTINE X 2)  URINALYSIS, ROUTINE W REFLEX MICROSCOPIC  BLOOD GAS, ARTERIAL  I-STAT CG4 LACTIC ACID, ED  I-STAT CG4 LACTIC ACID, ED    EKG  EKG Interpretation  Date/Time:  Monday January 04 2018 15:41:48 EST Ventricular Rate:  108 PR Interval:    QRS Duration: 89 QT Interval:  353 QTC Calculation: 474 R Axis:   83 Text Interpretation:  Sinus tachycardia Right atrial enlargement Anteroseptal infarct, age indeterminate Baseline wander inhibits interpretation Confirmed by Raeford Razor [phone]) on 01/04/2018 5:03:43 PM       Radiology No results found.  Procedures OG placement Date/Time: 01/04/2018 5:48 PM Performed by:  Raeford Razor, MD Authorized by: Raeford Razor, MD  Consent: The procedure was performed in an emergent situation. Consent given by: implied consent. Patient identity confirmed: provided demographic data and arm band Local anesthesia used: no  Anesthesia: Local anesthesia used: no  Sedation: Patient sedated: yes (post induction for RSI)  Patient tolerance: Patient tolerated the procedure well with no immediate complications Comments: Difficult placement of OG by nursing. Placed by myself under visualization with glidescope.    (including critical care time)  CENTRAL LINE Performed by: Raeford Razor Consent: The procedure was performed in an emergent situation. Required items: required blood products, implants, devices, and special equipment available Patient identity confirmed: arm band and provided demographic data Time out: Immediately prior to procedure a "time out" was called to verify the correct patient, procedure, equipment, support staff and site/side marked as required. Indications: vascular access Anesthesia: local infiltration Local anesthetic: lidocaine 1% w/o epinephrine Anesthetic total: 3 ml Patient sedated: no Preparation: skin prepped with 2% chlorhexidine Skin prep agent dried: skin prep agent completely dried prior to procedure Sterile barriers:  sterile barriers used - cap, mask, sterile gloves, and large sterile sheet Hand hygiene: hand hygiene performed prior to central venous catheter insertion  Location details: R femoral vein Catheter type: triple lumen Catheter size: 8 Fr Pre-procedure: landmarks identified Ultrasound guidance: yes Successful placement: yes Post-procedure: line sutured and dressing applied Assessment: blood return through all parts, free fluid flow. Patient tolerance: Patient tolerated the procedure well with no immediate complications.  INTUBATION Performed by: Raeford Razor  Required items: required blood products,  implants, devices, and special equipment available Patient identity confirmed: provided demographic data and hospital-assigned identification number Time out: Immediately prior to procedure a "time out" was called to verify the correct patient, procedure, equipment, support staff and site/side marked as required.  Indications: airway protection Intubation method: Glidescope Laryngoscopy   Preoxygenation: BVM  Sedatives: Etomidate Paralytic: rocuronium  Tube Size: 7.5 cuffed  Post-procedure assessment: chest rise and ETCO2 monitor Breath sounds: equal and absent over the epigastrium Tube secured with: ETT holder Chest x-ray interpreted by radiologist and me.  Chest x-ray findings: endotracheal tube in  appropriate position  Patient tolerated the procedure well with no immediate complications.  CRITICAL CARE Performed by: Raeford Razor Total critical care time: 80 minutes Critical care time was exclusive of separately billable procedures and treating other patients. Critical care was necessary to treat or prevent imminent or life-threatening deterioration. Critical care was time spent personally by me on the following activities: development of treatment plan with patient and/or surrogate as well as nursing, discussions with consultants, evaluation of patient's response to treatment, examination of patient, obtaining history from patient or surrogate, ordering and performing treatments and interventions, ordering and review of laboratory studies, ordering and review of radiographic studies, pulse oximetry and re-evaluation of patient's condition.     Medications Ordered in ED Medications  vancomycin (VANCOCIN) IVPB 1000 mg/200 mL premix (not administered)  piperacillin-tazobactam (ZOSYN) IVPB 3.375 g (not administered)  sodium bicarbonate injection 50 mEq (not administered)  lidocaine (PF) (XYLOCAINE) 1 % injection 5 mL (5 mLs Other Given 01/04/18 1620)  sodium chloride 0.9 % bolus  1,000 mL (1,000 mLs Intravenous New Bag/Given 01/04/18 1647)  sodium chloride 0.9 % bolus 500 mL (500 mLs Intravenous New Bag/Given 01/04/18 1700)  etomidate (AMIDATE) injection 14 mg (14 mg Intravenous Given 01/04/18 1649)  rocuronium (ZEMURON) injection 50 mg (50 mg Intravenous Given 01/04/18 1651)  propofol (DIPRIVAN) 1000 MG/100ML infusion (5 mcg/kg/min  50.7 kg Intravenous New Bag/Given 01/04/18 1732)     Initial Impression / Assessment and Plan / ED Course  I have reviewed the triage vital signs and the nursing notes.  Pertinent labs & imaging results that were available during my care of the patient were reviewed by me and considered in my medical decision making (see chart for details).    56yM with AMS. AIDS with CD4 <10. Noncompliant with meds. Recent admission with cryptocococal meningitis. Hypotensive, hypothermic and lactic acid >9. Empiric zanc/zosyn. Ordered fluconazole for now. Will likely need ID recs for further meds. H&P from most recent admit documented Full Code. He was intubated for airway protection and also because of work of breathing and inability to get reliable o2 saturation. Central line placed because very difficult to obtain access and anticipated need for multiple meds/drips. Warming blanket.  Temp foley placed to monitor core temp and measure I/Os. Additional volume for dehydration/ARF. BP initially has responded. Bicarb for profound metabolic acidosis and hyperK. Admission to critical care service.   Final Clinical Impressions(s) / ED Diagnoses   Final diagnoses:  Septic shock (HCC)  AKI (acute kidney injury) (HCC)  Dehydration  Toxic encephalopathy  Hyperkalemia  AIDS (acquired immune deficiency syndrome) (HCC)  History of medication noncompliance    ED Discharge Orders    None       Raeford Razor, MD 01/04/18 1846

## 2018-01-04 NOTE — Progress Notes (Signed)
Pt transported from ED to [id number] on vent 100% fio2.  Pt tolerated transport well without incident.

## 2018-01-04 NOTE — ED Notes (Addendum)
Staff unable to obtain IV access. MD notified and coming to bedside to place central line. Pt on NRB. Unable to obtain oxygen saturation.

## 2018-01-04 NOTE — ED Notes (Signed)
ED TO INPATIENT HANDOFF REPORT  Name/Age/Gender Kenneth Rivas 57 y.o. male  Code Status    Code Status Orders  (From admission, onward)        Start     Ordered   01/04/18 1955  Full code  Continuous     01/04/18 2000    Code Status History    Date Active Date Inactive Code Status Order ID Comments User Context   11/26/2017 20:26 12/24/2017 18:26 Full Code [id number]  Elwin Mocha, MD Inpatient   06/16/2017 22:56 07/15/2017 02:15 Full Code [id number]  Lovenia Kim, MD Inpatient      Home/SNF/Other Home  Chief Complaint cant talk   Level of Care/Admitting Diagnosis ED Disposition    ED Disposition Condition [place]: [facility] [[id number]]  Level of Care: ICU [6]  Diagnosis: Septic shock [facility]) [[id number]]  Admitting Physician: Reyne Dumas [[id number]]  Attending Physician: Reyne Dumas [[id number]]  Estimated length of stay: 5 - 7 days  Certification:: I certify this patient will need inpatient services for at least 2 midnights  PT Class (Do Not Modify): Inpatient [101]  PT Acc Code (Do Not Modify): Private [1]       Medical History Past Medical History:  Diagnosis Date  . AIDS ([place])   . Anemia   . Anxiety   . Cellulitis of right hand 06/23/2017  . Hepatitis B   . HIV disease ([place])   . Hypertension   . Immune deficiency disorder ([place])     Allergies No Known Allergies  IV Location/Drains/Wounds Patient Lines/Drains/Airways Status   Active Line/Drains/Airways    Name:   Placement date:   Placement time:   Site:   Days:   CVC Triple Lumen 01/04/18 Right Femoral 20 cm   01/04/18    1644     less than 1   NG/OG Tube Orogastric 18 Fr. Center mouth Aucultation Measured external length of tube   01/04/18    1754    Center mouth   less than 1   Urethral Catheter Kathaleen Grinder, RN Latex 14 Fr.   01/04/18    1815    Latex   less than 1   External Urinary Catheter   12/14/17    1827    -   21   Airway 7.5 mm   01/04/18     1651     less than 1          Labs/Imaging Results for orders placed or performed during the hospital encounter of 01/04/18 (from the past 48 hour(s))  Comprehensive metabolic panel     Status: Abnormal   Collection Time: 01/04/18  4:37 PM  Result Value Ref Range   Sodium 147 (H) 135 - 145 mmol/L   Potassium 6.1 (H) 3.5 - 5.1 mmol/L   Chloride 116 (H) 101 - 111 mmol/L   CO2 8 (L) 22 - 32 mmol/L   Glucose, Bld 225 (H) 65 - 99 mg/dL   BUN 148 (H) 6 - 20 mg/dL    Comment: RESULTS CONFIRMED BY MANUAL DILUTION   Creatinine, Ser 6.30 (H) 0.61 - 1.24 mg/dL   Calcium 9.7 8.9 - 10.3 mg/dL   Total Protein 9.9 (H) 6.5 - 8.1 g/dL   Albumin 3.0 (L) 3.5 - 5.0 g/dL   AST 35 15 - 41 U/L   ALT 19 17 - 63 U/L   Alkaline Phosphatase 129 (H) 38 - 126 U/L   Total Bilirubin  0.7 0.3 - 1.2 mg/dL   GFR calc non Af Amer 9 (L) >60 mL/min   GFR calc Af Amer 10 (L) >60 mL/min    Comment: (NOTE) The eGFR has been calculated using the CKD EPI equation. This calculation has not been validated in all clinical situations. eGFR's persistently <60 mL/min signify possible Chronic Kidney Disease.    Anion gap 23 (H) 5 - 15    Comment: REPEATED TO VERIFY Performed at [facility], [place] [address] [place], [place] [address]   CBC with Differential     Status: Abnormal   Collection Time: 01/04/18  4:37 PM  Result Value Ref Range   WBC 10.3 4.0 - 10.5 K/uL   RBC 4.53 4.22 - 5.81 MIL/uL   Hemoglobin 12.6 (L) 13.0 - 17.0 g/dL   HCT 38.3 (L) 39.0 - 52.0 %   MCV 84.5 78.0 - 100.0 fL   MCH 27.8 26.0 - 34.0 pg   MCHC 32.9 30.0 - 36.0 g/dL   RDW 15.5 11.5 - 15.5 %   Platelets 321 150 - 400 K/uL   Neutrophils Relative % 82 %   Neutro Abs 8.5 (H) 1.7 - 7.7 K/uL   Lymphocytes Relative 9 %   Lymphs Abs 0.9 0.7 - 4.0 K/uL   Monocytes Relative 9 %   Monocytes Absolute 0.9 0.1 - 1.0 K/uL   Eosinophils Relative 0 %   Eosinophils Absolute 0.0 0.0 - 0.7 K/uL   Basophils Relative 0 %   Basophils  Absolute 0.0 0.0 - 0.1 K/uL    Comment: Performed at [facility], [place] [address], [place], [place] [address]  Protime-INR     Status: Abnormal   Collection Time: 01/04/18  4:37 PM  Result Value Ref Range   Prothrombin Time 16.2 (H) 11.4 - 15.2 seconds   INR 1.31     Comment: Performed at [facility], [place] [address] [place], [place] [address]  Magnesium     Status: Abnormal   Collection Time: 01/04/18  4:37 PM  Result Value Ref Range   Magnesium 3.5 (H) 1.7 - 2.4 mg/dL    Comment: Performed at [facility], [place] [address] [place], [place] [address]  Troponin I     Status: Abnormal   Collection Time: 01/04/18  4:37 PM  Result Value Ref Range   Troponin I 0.05 (HH) <0.03 ng/mL    Comment: CRITICAL RESULT CALLED TO, READ BACK BY AND VERIFIED WITH: S.LEONARD AT 1807 ON 01/04/18 BY N.THOMPSON Performed at [facility], [place] [address], [place], [place] [address]   I-Stat CG4 Lactic Acid, ED     Status: Abnormal   Collection Time: 01/04/18  4:46 PM  Result Value Ref Range   Lactic Acid, Venous 9.16 (HH) 0.5 - 1.9 mmol/L   Comment NOTIFIED PHYSICIAN   Blood gas, arterial ([facility] & [facility] ONLY)     Status: Abnormal   Collection Time: 01/04/18  5:05 PM  Result Value Ref Range   FIO2 100.00    Delivery systems VENTILATOR    Mode PRESSURE REGULATED VOLUME CONTROL    VT 520 mL   LHR 20 resp/min   Peep/cpap 5.0 cm H20   pH, Arterial 7.062 (LL) 7.350 - 7.450    Comment: CRITICAL RESULT CALLED TO, READ BACK BY AND VERIFIED WITH: S.KOHUT, MD AT 1711 BY M.JESTER, RRT, RCP ON 01/04/18    pCO2 arterial 24.5 (L) 32.0 - 48.0 mmHg   pO2, Arterial  ABOVE REPORTABLE RANGE 83.0 - 108.0 mmHg    Comment: CRITICAL RESULT CALLED TO, READ BACK BY AND VERIFIED WITH: S.KOHUT, MD AT 1711 BY M.JESTER, RRT, RCP ON 01/04/18    Bicarbonate 6.7 (L) 20.0 - 28.0 mmol/L   Acid-base deficit 22.4 (H) 0.0 - 2.0 mmol/L   O2 Saturation 98.5 %    Patient temperature 98.6    Collection site BRACHIAL ARTERY    Drawn by [id number]    Sample type ARTERIAL DRAW    Allens test (pass/fail) PASS PASS    Comment: Performed at [facility], [place] [address] [place], [place] [address]  Blood gas, arterial     Status: Abnormal   Collection Time: 01/04/18  6:35 PM  Result Value Ref Range   FIO2 40.00    Delivery systems VENTILATOR    Mode PRESSURE REGULATED VOLUME CONTROL    VT 520 mL   LHR 20.0 resp/min   Peep/cpap 5.0 cm H20   pH, Arterial 7.285 (L) 7.350 - 7.450   pCO2 arterial 21.4 (L) 32.0 - 48.0 mmHg   pO2, Arterial 221 (H) 83.0 - 108.0 mmHg   Bicarbonate 10.6 (L) 20.0 - 28.0 mmol/L   Acid-base deficit 15.5 (H) 0.0 - 2.0 mmol/L   O2 Saturation 98.4 %   Patient temperature 32.8    Collection site BRACHIAL ARTERY    Drawn by [id number]    Sample type ARTERIAL DRAW    Allens test (pass/fail) PASS PASS    Comment: Performed at [place] [address], [place], [place] [address]  I-Stat CG4 Lactic Acid, ED     Status: Abnormal   Collection Time: 01/04/18  6:54 PM  Result Value Ref Range   Lactic Acid, Venous 3.82 (HH) 0.5 - 1.9 mmol/L   Comment NOTIFIED PHYSICIAN   Urinalysis, Routine w reflex microscopic     Status: Abnormal   Collection Time: 01/04/18  7:10 PM  Result Value Ref Range   Color, Urine YELLOW YELLOW   APPearance CLOUDY (A) CLEAR   Specific Gravity, Urine 1.017 1.005 - 1.030   pH 7.0 5.0 - 8.0   Glucose, UA NEGATIVE NEGATIVE mg/dL   Hgb urine dipstick SMALL (A) NEGATIVE   Bilirubin Urine NEGATIVE NEGATIVE   Ketones, ur NEGATIVE NEGATIVE mg/dL   Protein, ur 100 (A) NEGATIVE mg/dL   Nitrite NEGATIVE NEGATIVE   Leukocytes, UA LARGE (A) NEGATIVE   RBC / HPF 6-30 0 - 5 RBC/hpf   WBC, UA TOO NUMEROUS TO COUNT 0 - 5 WBC/hpf   Bacteria, UA MANY (A) NONE SEEN   Squamous Epithelial / LPF 0-5 (A) NONE SEEN   WBC Clumps PRESENT    Mucus PRESENT    Hyaline Casts, UA PRESENT      Comment: Performed at [facility], [place] [address], [place],  [address]   Dg Chest Portable 1 View  Result Date: 01/04/2018 CLINICAL DATA:  Encounter for endotracheal tube position. EXAM: PORTABLE CHEST 1 VIEW COMPARISON:  11/26/2017 FINDINGS: The heart size and mediastinal contours are within normal limits. Tip of endotracheal tube is 5.4 cm above the carina. Both lungs are hyperinflated but clear. No pneumothorax or effusion. Costophrenic angles are excluded on this study bilaterally. The visualized skeletal structures are unremarkable. IMPRESSION: Satisfactory endotracheal tube position with the tip 5.4 cm above the carina. Hyperinflated lungs. Electronically Signed   By: Ashley Royalty M.D.   On: 01/04/2018 18:13   Dg Abd Portable 1v  Result Date: 01/04/2018  CLINICAL DATA:  NG tube placement. EXAM: PORTABLE ABDOMEN - 1 VIEW COMPARISON:  None. FINDINGS: The NG tube tip is in the fundal region of the stomach. The proximal port is in the distal esophagus. The tube could be advanced several cm. IMPRESSION: NG tube tip is in the fundal region the stomach with the proximal port in the distal esophagus. Electronically Signed   By: Marijo Sanes M.D.   On: 01/04/2018 19:41    Pending Labs Unresulted Labs (From admission, onward)   Start     Ordered   01/05/18 0500  Procalcitonin  Daily,   R     01/04/18 1951   01/05/18 0500  CBC  Tomorrow morning,   R     01/04/18 2000   01/05/18 3846  Basic metabolic panel  Tomorrow morning,   R     01/04/18 2000   01/05/18 0500  Blood gas, arterial  Tomorrow morning,   R     01/04/18 2000   01/05/18 0500  Magnesium  Tomorrow morning,   R     01/04/18 2000   01/05/18 0500  Phosphorus  Tomorrow morning,   R     01/04/18 2000   01/04/18 1959  Triglycerides  (propofol (DIPRIVAN))  Every 72 hours,   R    Comments:  While on propofol (DIPRIVAN)    01/04/18 2000   01/04/18 1954  Culture, respiratory (NON-Expectorated)  STAT,   R      01/04/18 1953   01/04/18 1954  Cortisol  STAT,   STAT     01/04/18 2000   01/04/18 1953  Culture, Urine  STAT,   R     01/04/18 1953   01/04/18 1953  Urine rapid drug screen (hosp performed)  STAT,   R     01/04/18 1953   01/04/18 1952  Respiratory Panel by PCR  (Respiratory virus panel)  STAT,   R     01/04/18 1951   01/04/18 6599  Basic metabolic panel  STAT,   R     01/04/18 1951   01/04/18 1952  Magnesium  STAT,   R     01/04/18 1951   01/04/18 1952  Phosphorus  STAT,   R     01/04/18 1951   01/04/18 1952  Lactic acid, plasma  STAT,   R     01/04/18 1951   01/04/18 1952  Procalcitonin - Baseline  STAT,   STAT     01/04/18 1951   01/04/18 1548  Culture, blood (Routine x 2)  BLOOD CULTURE X 2,   STAT     01/04/18 1547      Vitals/Pain Today's Vitals   01/04/18 1837 01/04/18 1900 01/04/18 1937 01/04/18 1940  BP: (!) 138/107 (!) 159/125 (!) 149/114 (!) 159/124  Pulse: 84 86 88 91  Resp: '20 20 20 18  '$ Temp: (!) 91 F (32.8 C) (!) 90.9 F (32.7 C) (!) 91.4 F (33 C) (!) 91.4 F (33 C)  TempSrc: Other (Comment)     SpO2:  100% 100% 100%  Weight:      Height:        Isolation Precautions Droplet precaution  Medications Medications  sodium chloride flush (NS) 0.9 % injection 10-40 mL (not administered)  sodium chloride flush (NS) 0.9 % injection 10-40 mL (not administered)  sodium bicarbonate 150 mEq in sterile water 1,000 mL infusion (not administered)  0.9 %  sodium chloride infusion (not administered)  heparin injection 5,000 Units (not  administered)  acetaminophen (TYLENOL) tablet 650 mg (not administered)  ondansetron (ZOFRAN) injection 4 mg (not administered)  chlorhexidine gluconate (MEDLINE KIT) (PERIDEX) 0.12 % solution 15 mL (not administered)  MEDLINE mouth rinse (not administered)  insulin aspart (novoLOG) injection 1-3 Units (not administered)  fentaNYL (SUBLIMAZE) injection 100 mcg (not administered)  fentaNYL (SUBLIMAZE) injection 100 mcg (not  administered)  propofol (DIPRIVAN) 1000 MG/100ML infusion (not administered)  midazolam (VERSED) injection 1 mg (not administered)  docusate (COLACE) 50 MG/5ML liquid 100 mg (not administered)  lidocaine (PF) (XYLOCAINE) 1 % injection 5 mL (5 mLs Other Given 01/04/18 1620)  vancomycin (VANCOCIN) IVPB 1000 mg/200 mL premix (0 mg Intravenous Stopped 01/04/18 1914)  piperacillin-tazobactam (ZOSYN) IVPB 3.375 g (0 g Intravenous Stopped 01/04/18 1813)  sodium chloride 0.9 % bolus 1,000 mL (0 mLs Intravenous Stopped 01/04/18 1740)  sodium chloride 0.9 % bolus 500 mL (0 mLs Intravenous Stopped 01/04/18 1849)  etomidate (AMIDATE) injection 14 mg (14 mg Intravenous Given 01/04/18 1649)  rocuronium (ZEMURON) injection 50 mg (50 mg Intravenous Given 01/04/18 1651)  propofol (DIPRIVAN) 1000 MG/100ML infusion (10 mcg/kg/min  50.7 kg Intravenous Rate/Dose Change 01/04/18 1933)  sodium bicarbonate injection 50 mEq (50 mEq Intravenous Given 01/04/18 1744)  fluconazole (DIFLUCAN) 40 MG/ML suspension 400 mg (400 mg Per Tube Given 01/04/18 1949)  sodium chloride 0.9 % bolus 1,000 mL (0 mLs Intravenous Stopped 01/04/18 1948)  calcium gluconate 1 g in sodium chloride 0.9 % 100 mL IVPB (0 g Intravenous Stopped 01/04/18 1913)  insulin aspart (novoLOG) injection 10 Units (10 Units Intravenous Given 01/04/18 1939)  dextrose 50 % solution 50 mL (50 mLs Intravenous Given 01/04/18 1939)    Mobility non-ambulatory

## 2018-01-04 NOTE — ED Notes (Signed)
Small amount of urine output from foley placed. Kat, NT obtained the urine for the culture before obtaining urine for urinalysis. Not enough urine for sample. Lequita HaltMorgan, RN Engineer, manufacturing systems(Charge Nurse) aware of this delay.

## 2018-01-04 NOTE — Progress Notes (Signed)
Pharmacy Antibiotic Note  Kenneth Rivas is a 57 y.o. male admitted on 01/04/2018 with altered mental status and weakness.  PMH significant for AIDS, cryptococcal meningitis.  Recent hospitalization where pt received Amphotericin B and flucytosine and was discharged on fluconazole.  In ED patient received Zosyn 3.375gm IV and Vancomycin 1000mg  IV x 1 dose each.  Patient intubated in ED.  Upon admission, Pharmacy has been consulted for Vancomycin and Zosyn dosing for sepsis.   Scr = 6.3 (Scr = 0.76 on 12/23/17)  Plan:  Zosyn 2.25gm IV q8h  Will dose Vancomycin based off of random levels until renal function improves (pt received Vancomycin 1gm @ 18:14 today)  F/U AM SCr  Height: 5\' 6"  (167.6 cm) Weight: 110 lb (49.9 kg) IBW/kg (Calculated) : 63.8  Temp (24hrs), Avg:91.4 F (33 C), Min:90.7 F (32.6 C), Max:92.1 F (33.4 C)  Recent Labs  Lab 01/04/18 1637 01/04/18 1646 01/04/18 1854  WBC 10.3  --   --   CREATININE 6.30*  --   --   LATICACIDVEN  --  9.16* 3.82*    Estimated Creatinine Clearance: 9 mL/min (A) (by C-G formula based on SCr of 6.3 mg/dL (H)).    No Known Allergies  Antimicrobials this admission: 2/18 vanc >>   2/18 zosyn >>    Dose adjustments this admission:    Microbiology results: 2/18 BCx: sent 2/18 UCx: sent  2/18 Sputum: ordered 2/18 MRSA PCR: ordered 2/18 Resp Panel: ordered  Thank you for allowing pharmacy to be a part of this patient's care.  Maryellen PilePoindexter, Kenneth Rivas, PharmD 01/04/2018 8:37 PM

## 2018-01-04 NOTE — ED Notes (Signed)
Bed: WA20 Expected date:  Expected time:  Means of arrival:  Comments: intubation

## 2018-01-04 NOTE — ED Notes (Signed)
Spoke with Dr. Juleen ChinaKohut about patients BP being hypertensive, ex. 147/112, he reports to not do any other measures to lower BP. He states patient is usually hypertensive.

## 2018-01-04 NOTE — Progress Notes (Signed)
Pt transported from ED to CT and back on vent 100% fio2.  Pt tolerated transport well without incident. 

## 2018-01-04 NOTE — H&P (Signed)
PULMONARY / CRITICAL CARE MEDICINE   Name: Kenneth Rivas MRN: [id number] DOB: 09-Jul-1961    ADMISSION DATE:  01/04/2018 CONSULTATION DATE:  01/04/18  REFERRING MD:  Juleen China - EDP  CHIEF COMPLAINT:  AMS  HISTORY OF PRESENT ILLNESS:  Pt is encephelopathic; therefore, this HPI is obtained from chart review. Kenneth Rivas is a 57 y.o. 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  He completed flucytosine on 2/6 and was transitioned to oral fluconazole with plans for 8 weeks of therapy before dose adjustment by ID at outpatient follow up.  SNF was recommended on discharge but pt refused.  He went to live at a boarding house with a roommmate and apparently stopped taking his meds.  Home health nurses visited him on Friday 2/15 and recommended that he come to the ED for evaluation due to generalized weakness but he refused. On 2/18, case worker went to pickup pt for an appointment and found him with profound weakness and AMS.  He was subsequently brought to [facility] ED for further evaluation and management.  In ED, he required intubation due to AMS and inability to protect his airway.  PCCM was subsequently called for admission to the ICU.  Per last ID notes 12/24/17, pt's functional status remained extremely poor.  Of note, he was also seen by psychiatry 12/24/17 for capacity evaluation.  He was deemed to NOT have capacity and it was recommended that he have an authorized representative help make decisions regarding his care.  At time of my exam, patient is intubated and sedated. He flutters eyelids to loud voice but is not obeying commands. RN reports patient's roommate dropped him off at the ER and left. I  called the roommate Kenneth Rivas, who says that he is not a roommate but is the long-term partner of the patient. He says they are not legally married though and that the patient has never signed POA paperwork. He reports patient's next of kin is his sister Kenneth Rivas, who was unable to be reached tonight. Kenneth Rivas reports that since patient's last hospital discharge, he has been weak and has been having urinary incontinence. He says patient had a nonproductive cough last week. He also reports no PO intake for the past 9 days and that patient hasnt taken his meds for 2 days. He denies that patient has been having any N/V/D, Fever, or Abdominal pain. He says patient has been very cold/chilled lately and has been needing more blankets than usual. Patient was on the way to a doctor's appointment and passed out in the back of the car, so Kenneth Rivas said they detoured to the ER instead.   PAST MEDICAL HISTORY :  He  has a past medical history of AIDS (HCC), Anemia, Anxiety, Cellulitis of right hand (06/23/2017), Hepatitis B, HIV disease (HCC), Hypertension, and Immune deficiency disorder (HCC).  PAST SURGICAL HISTORY: He  has a past surgical history that includes Hernia repair.  No Known Allergies  No current facility-administered medications on file prior to encounter.    Current Outpatient Medications on File Prior to Encounter  Medication Sig  . azithromycin (ZITHROMAX) 600 MG tablet Take 2 tablets (1,200 mg total) by mouth once a week.  . carvedilol (COREG) 3.125 MG tablet Take  1 tablet (3.125 mg total) by mouth 2 (two) times daily with a meal.  . fluconazole (DIFLUCAN) 200 MG tablet Take 2 tablets (400 mg total) by mouth daily.  Marland Kitchen. ibuprofen (ADVIL,MOTRIN) 200 MG tablet Take 200-600 mg by mouth every 6 (six) hours as needed.  . polyvinyl alcohol (LIQUIFILM TEARS) 1.4 % ophthalmic solution Place 1 drop into both eyes at bedtime. (Patient not taking: Reported on 09/09/2017)  . sodium bicarbonate 650 MG tablet Take 2  tablets (1,300 mg total) by mouth 2 (two) times daily.  Marland Kitchen. sulfamethoxazole-trimethoprim (BACTRIM DS,SEPTRA DS) 800-160 MG tablet Take 1 tablet by mouth daily.   FAMILY HISTORY:  His indicated that his mother is alive. He indicated that his father is deceased. He indicated that his maternal grandmother is deceased. He indicated that his maternal grandfather is deceased. He indicated that his paternal grandmother is deceased. He indicated that his paternal grandfather is deceased.  SOCIAL HISTORY: He  reports that he has been smoking cigarettes.  He has been smoking about 0.30 packs per day. he has never used smokeless tobacco. He reports that he drinks about 2.0 oz of alcohol per week. He reports that he uses drugs. Drug: Cocaine.  REVIEW OF SYSTEMS:   Unable to obtain as pt is encephalopathic and intubated   SUBJECTIVE:  Intubated and sedated   VITAL SIGNS: BP (!) 137/116   Pulse 92   Temp (!) 91.9 F (33.3 C) (Rectal)   Resp 20   Ht 5\' 6"  (1.676 m)   Wt 49.9 kg (110 lb)   SpO2 98%   BMI 17.75 kg/m   VENTILATOR SETTINGS: Vent Mode: PRVC FiO2 (%):  [40 %] 40 % Set Rate:  [20 bmp] 20 bmp Vt Set:  [520 mL] 520 mL PEEP:  [5 cmH20] 5 cmH20 Plateau Pressure:  [15 cmH20] 15 cmH20  INTAKE / OUTPUT: No intake/output data recorded.  PHYSICAL EXAMINATION: General: Cachectic adult male, intubated and sedated, critically ill Neuro: flutters eyelids in response to loud voice, not obeying commands. Not spontaneously moving any extremities. PERRL. No nuchal rigidity HEENT: Orally intubated; Thrush present  Cardiovascular: RRR no m/r/g Lungs: CTA b/l Abdomen: Soft flat, NTND, BS hypoactive  Musculoskeletal: no LE edema; Very cachectic and malnourished in appearance Skin: no rashes   LABS:  BMET Recent Labs  Lab 01/04/18 1637  NA 147*  K 6.1*  CL 116*  CO2 8*  BUN PENDING  CREATININE 6.30*  GLUCOSE 225*    Electrolytes Recent Labs  Lab 01/04/18 1637  CALCIUM 9.7  MG  3.5*    CBC Recent Labs  Lab 01/04/18 1637  WBC 10.3  HGB 12.6*  HCT 38.3*  PLT 321    Coag's Recent Labs  Lab 01/04/18 1637  INR 1.31    Sepsis Markers Recent Labs  Lab 01/04/18 1646  LATICACIDVEN 9.16*    ABG Recent Labs  Lab 01/04/18 1705  PHART 7.062*  PCO2ART 24.5*  PO2ART ABOVE REPORTABLE RANGE    Liver Enzymes Recent Labs  Lab 01/04/18 1637  AST 35  ALT 19  ALKPHOS 129*  BILITOT 0.7  ALBUMIN 3.0*    Cardiac Enzymes Recent Labs  Lab 01/04/18 1637  TROPONINI 0.05*   Glucose No results for input(s): GLUCAP in the last 168 hours.  Imaging Dg Chest Portable 1 View  Result Date: 01/04/2018 CLINICAL DATA:  Encounter for endotracheal tube position. EXAM: PORTABLE CHEST 1 VIEW COMPARISON:  11/26/2017 FINDINGS: The heart size and mediastinal contours are within normal limits. Tip  of endotracheal tube is 5.4 cm above the carina. Both lungs are hyperinflated but clear. No pneumothorax or effusion. Costophrenic angles are excluded on this study bilaterally. The visualized skeletal structures are unremarkable. IMPRESSION: Satisfactory endotracheal tube position with the tip 5.4 cm above the carina. Hyperinflated lungs. Electronically Signed   By: Tollie Eth M.D.   On: 01/04/2018 18:13   STUDIES:  CXR 2/18 > hyperinflation. Renal US 2/18 (ordered) >  Head CT 2/18 (ordered) >  CULTURES: Blood 2/18 >  Urine 2/18 (ordered) > Sputum 2/19 (ordered) >  ANTIBIOTICS: Vanc 2/18 Zosyn 2/18  SIGNIFICANT EVENTS: 2/18 > admitted with acute encephalopathy requiring intubation for failure to protect airway, hypothermic and in septic shock on presentation to ER. BP improved with IVF's.  LINES/TUBES: ETT 2/18 >  Foley catheter 2/18 > OG tube 2/18 >  DISCUSSION: 57 y.o. male with hx AIDS (CD4 < 10) and medication non-compliance, recent cryptococcal meningitis.  Just discharged 12/24/17 with plans for 8 weeks fluconazole before dose adjustment by ID.  Admitted  2/18 with AMS and weakness.  Required intubation in ED.  ASSESSMENT / PLAN:  PULMONARY A: Respiratory insufficiency - s/p intubation in ED. P:   Full vent support. Wean as able. VAP prevention measures. SBT in AM if mental status allows. CXR on my review shows pulmonary hyperinflation but no infiltrates or effusions  CARDIOVASCULAR A:  Troponin bump - suspect demand. Hx HTN. P:  Trend troponins, lactate. Hold preadmission carvedilol.  RENAL A:   Hypernatremia - dehydration from decreased PO intake.  FWD ~ 1.5L. Hyperkalemia - s/p 1g Ca gluconate, 10u insulin, 1amp D50 in ED. Hyperchloremia. AGMA - lactate + uremia. AKI - ? Pre-renal from decreased PO / hypovolemia vs ATN. Hypermagnesemia. P:   - s/p 2.5L IVF bolus; will start Bicarb gtt given the severe metabolic acidosis which is partially from the lactic acidosis and partially from the AKI. - Free water per tube, q4hrs. - Repeat BMP now. - Might need addition of kayexalate. - Assess renal US. - likely will need Renal consult in AM  GASTROINTESTINAL A:   Protein calorie malnutrition. GI prophylaxis. Nutrition. P:   Start TF's in AM. Watch closely for refeeding syndrome.  SUP: Pantoprazole. NPO.  HEMATOLOGIC A:   Anemia - chronic. VTE Prophylaxis. P:  Transfuse for Hgb < 7. SCD's / heparin. CBC in AM.  INFECTIOUS A:   Hx recent cryptococcal meningitis (was prescribed 800mg  fluconazole daily x 8 weeks), AIDS (last CD4 count < 10 on 11/27/17), HBV. Septic shock due to UTI P:   - Consult ID. - UA is consistent with a UTI; urine culture and blood cultures pending.  - obtain sputum culture although no infiltrates on cxr. Obtain respiratory viral panel.  - head ct now.  - Defer on assessing crypto antigen to ID - might re-induce him with therapy anyways. - Continue Vanc and Zosyn for treatment of UTI that was started on ER; defer treatment of the crypto meningitis to ID - check procalcitonin; trend  lactate  ENDOCRINE A:   Hyperglycemia.  P:   SSI.  NEUROLOGIC A:   Acute encephalopathy - multifactorial, likely exacerbated by worsening cryptococcal meningitis in setting of medication non-compliance. Failure to thrive - per notes from last admit, pts functional status remained extremely poor.  No capacity for decision making - seen by psych on last admit and deemed to NOT have capacity. P:   Sedation:  Propofol gtt / fentanyl PRN.  RASS goal: 0 to -  1. Daily WUA. Needs palliative care consult, ? Ethics. Obtain Head CT now Order EEG Might need repeat LP.  Family updated: updated patient's long-time partner Kenneth Rivas; attempted to call patient's sister Kenneth Rivas without success  Interdisciplinary Family Meeting v Palliative Care Meeting:  Due by: 01/10/18.  CC time: 60 minutes

## 2018-01-04 NOTE — ED Notes (Signed)
MD notified about patients rectal temp. Patient placed on bair hugger.

## 2018-01-04 NOTE — ED Notes (Signed)
MD at bedside. 

## 2018-01-04 NOTE — ED Triage Notes (Signed)
Patient BIB case worker and family. Reports patient has been admitted x2 for meningitis in the past two months. States patient refused rehab after admission. Per case worker, home health nurse recommended patient come to the ED Friday, but patient refused. Case worker reports he went to take patient to appointment today and insisted patient come to be evaluated. Patient c/o generalized weakness and family reports patient has not eaten in approximately one week. Denies N/V/D.

## 2018-01-05 ENCOUNTER — Inpatient Hospital Stay (HOSPITAL_COMMUNITY)
Admit: 2018-01-05 | Discharge: 2018-01-05 | Disposition: A | Discharge status: Home / Self Care | Payer: Medicaid Other | Attending: Critical Care Medicine | Admitting: Critical Care Medicine

## 2018-01-05 ENCOUNTER — Inpatient Hospital Stay (HOSPITAL_COMMUNITY): Payer: Medicaid Other

## 2018-01-05 ENCOUNTER — Encounter (HOSPITAL_COMMUNITY): Payer: Self-pay | Admitting: Internal Medicine

## 2018-01-05 DIAGNOSIS — R509 Fever, unspecified: Secondary | ICD-10-CM

## 2018-01-05 DIAGNOSIS — Z21 Asymptomatic human immunodeficiency virus [HIV] infection status: Secondary | ICD-10-CM

## 2018-01-05 DIAGNOSIS — R64 Cachexia: Secondary | ICD-10-CM

## 2018-01-05 DIAGNOSIS — G9340 Encephalopathy, unspecified: Secondary | ICD-10-CM

## 2018-01-05 DIAGNOSIS — N3000 Acute cystitis without hematuria: Secondary | ICD-10-CM

## 2018-01-05 DIAGNOSIS — N289 Disorder of kidney and ureter, unspecified: Secondary | ICD-10-CM

## 2018-01-05 DIAGNOSIS — Z9911 Dependence on respirator [ventilator] status: Secondary | ICD-10-CM

## 2018-01-05 DIAGNOSIS — F17210 Nicotine dependence, cigarettes, uncomplicated: Secondary | ICD-10-CM

## 2018-01-05 DIAGNOSIS — J9601 Acute respiratory failure with hypoxia: Secondary | ICD-10-CM

## 2018-01-05 DIAGNOSIS — B451 Cerebral cryptococcosis: Secondary | ICD-10-CM

## 2018-01-05 DIAGNOSIS — Z8661 Personal history of infections of the central nervous system: Secondary | ICD-10-CM

## 2018-01-05 HISTORY — DX: Disorder of kidney and ureter, unspecified: N28.9

## 2018-01-05 LAB — CBC
HCT: 24.7 % — ABNORMAL LOW (ref 39.0–52.0)
Hemoglobin: 8.3 g/dL — ABNORMAL LOW (ref 13.0–17.0)
MCH: 27.1 pg (ref 26.0–34.0)
MCHC: 33.6 g/dL (ref 30.0–36.0)
MCV: 80.7 fL (ref 78.0–100.0)
PLATELETS: 168 10*3/uL (ref 150–400)
RBC: 3.06 MIL/uL — ABNORMAL LOW (ref 4.22–5.81)
RDW: 15.3 % (ref 11.5–15.5)
WBC: 7.9 10*3/uL (ref 4.0–10.5)

## 2018-01-05 LAB — RESPIRATORY PANEL BY PCR
ADENOVIRUS-RVPPCR: NOT DETECTED
Bordetella pertussis: NOT DETECTED
CORONAVIRUS 229E-RVPPCR: NOT DETECTED
CORONAVIRUS NL63-RVPPCR: NOT DETECTED
CORONAVIRUS OC43-RVPPCR: NOT DETECTED
Chlamydophila pneumoniae: NOT DETECTED
Coronavirus HKU1: NOT DETECTED
Influenza A: NOT DETECTED
Influenza B: NOT DETECTED
METAPNEUMOVIRUS-RVPPCR: NOT DETECTED
MYCOPLASMA PNEUMONIAE-RVPPCR: NOT DETECTED
PARAINFLUENZA VIRUS 1-RVPPCR: NOT DETECTED
Parainfluenza Virus 2: NOT DETECTED
Parainfluenza Virus 3: NOT DETECTED
Parainfluenza Virus 4: NOT DETECTED
RESPIRATORY SYNCYTIAL VIRUS-RVPPCR: NOT DETECTED
Rhinovirus / Enterovirus: NOT DETECTED

## 2018-01-05 LAB — BLOOD GAS, ARTERIAL
Acid-base deficit: 6 mmol/L — ABNORMAL HIGH (ref 0.0–2.0)
Bicarbonate: 16.5 mmol/L — ABNORMAL LOW (ref 20.0–28.0)
Drawn by: 225631
FIO2: 30
LHR: 20 {breaths}/min
MECHVT: 0.52 mL
O2 Saturation: 98.5 %
PEEP: 5 cmH2O
PO2 ART: 133 mmHg — AB (ref 83.0–108.0)
Patient temperature: 97.8
pCO2 arterial: 23.6 mmHg — ABNORMAL LOW (ref 32.0–48.0)
pH, Arterial: 7.457 — ABNORMAL HIGH (ref 7.350–7.450)

## 2018-01-05 LAB — GLUCOSE, CAPILLARY
GLUCOSE-CAPILLARY: 67 mg/dL (ref 65–99)
GLUCOSE-CAPILLARY: 85 mg/dL (ref 65–99)
Glucose-Capillary: 80 mg/dL (ref 65–99)
Glucose-Capillary: 119 mg/dL — ABNORMAL HIGH (ref 65–99)
Glucose-Capillary: 113 mg/dL — ABNORMAL HIGH (ref 65–99)
Glucose-Capillary: 97 mg/dL (ref 65–99)
Glucose-Capillary: 127 mg/dL — ABNORMAL HIGH (ref 65–99)

## 2018-01-05 LAB — BASIC METABOLIC PANEL
Anion gap: 15 (ref 5–15)
BUN: 132 mg/dL — AB (ref 6–20)
CALCIUM: 7.8 mg/dL — AB (ref 8.9–10.3)
CO2: 17 mmol/L — ABNORMAL LOW (ref 22–32)
Chloride: 116 mmol/L — ABNORMAL HIGH (ref 101–111)
Creatinine, Ser: 5.14 mg/dL — ABNORMAL HIGH (ref 0.61–1.24)
GFR, EST AFRICAN AMERICAN: 13 mL/min — AB (ref >60)
GFR, EST NON AFRICAN AMERICAN: 11 mL/min — AB (ref >60)
Glucose, Bld: 100 mg/dL — ABNORMAL HIGH (ref 65–99)
Potassium: 5.2 mmol/L — ABNORMAL HIGH (ref 3.5–5.1)
SODIUM: 148 mmol/L — AB (ref 135–145)

## 2018-01-05 LAB — MAGNESIUM
MAGNESIUM: 2.2 mg/dL (ref 1.7–2.4)
Magnesium: 2.3 mg/dL (ref 1.7–2.4)
Magnesium: 2.2 mg/dL (ref 1.7–2.4)

## 2018-01-05 LAB — PROCALCITONIN: Procalcitonin: 5.43 ng/mL

## 2018-01-05 LAB — PHOSPHORUS
PHOSPHORUS: 6.2 mg/dL — AB (ref 2.5–4.6)
PHOSPHORUS: 5.3 mg/dL — AB (ref 2.5–4.6)
Phosphorus: 7.2 mg/dL — ABNORMAL HIGH (ref 2.5–4.6)

## 2018-01-05 LAB — LACTIC ACID, PLASMA: LACTIC ACID, VENOUS: 1.9 mmol/L (ref 0.5–1.9)

## 2018-01-05 MED ORDER — SODIUM CHLORIDE 0.9 % IV SOLN: INTRAVENOUS | Status: DC

## 2018-01-05 MED ORDER — NEPRO/CARBSTEADY PO LIQD
1000.0000 mL | ORAL | Status: DC
  Administered 2018-01-05: 1000 mL via ORAL
  Filled 2018-01-05 (×5): qty 1000

## 2018-01-05 MED ORDER — SODIUM CHLORIDE 0.9 % IV BOLUS (SEPSIS): 500.0000 mL | Freq: Once | INTRAVENOUS | Status: DC

## 2018-01-05 MED ORDER — DEXTROSE 50 % IV SOLN
INTRAVENOUS | Status: AC
  Administered 2018-01-05: 25 mL
  Filled 2018-01-05: qty 50

## 2018-01-05 MED ORDER — VITAL HIGH PROTEIN PO LIQD
1000.0000 mL | ORAL | Status: DC
  Filled 2018-01-05: qty 1000

## 2018-01-05 MED ORDER — SULFAMETHOXAZOLE-TRIMETHOPRIM 800-160 MG PO TABS
1.0000 | ORAL_TABLET | ORAL | Status: DC
  Administered 2018-01-06 – 2018-01-15 (×5): 1 via ORAL
  Filled 2018-01-05 (×5): qty 1

## 2018-01-05 MED ORDER — SODIUM CHLORIDE 0.9 % IV BOLUS (SEPSIS)
500.0000 mL | Freq: Once | INTRAVENOUS | Status: AC
  Administered 2018-01-05: 500 mL via INTRAVENOUS

## 2018-01-05 MED ORDER — STERILE WATER FOR INJECTION IV SOLN
INTRAVENOUS | Status: DC
  Administered 2018-01-05 – 2018-01-06 (×3): via INTRAVENOUS
  Filled 2018-01-05 (×4): qty 850

## 2018-01-05 MED ORDER — AZITHROMYCIN 600 MG PO TABS
1200.0000 mg | ORAL_TABLET | ORAL | Status: DC
  Filled 2018-01-05: qty 2

## 2018-01-05 MED ORDER — PRO-STAT SUGAR FREE PO LIQD
30.0000 mL | Freq: Two times a day (BID) | ORAL | Status: DC
  Administered 2018-01-05: 30 mL
  Filled 2018-01-05: qty 30

## 2018-01-05 MED ORDER — SODIUM CHLORIDE 0.9 % IV BOLUS (SEPSIS)
1000.0000 mL | Freq: Once | INTRAVENOUS | Status: AC
  Administered 2018-01-05: 1000 mL via INTRAVENOUS

## 2018-01-05 MED ORDER — AZITHROMYCIN 200 MG/5ML PO SUSR
1200.0000 mg | ORAL | Status: DC
  Administered 2018-01-05: 1200 mg
  Filled 2018-01-05: qty 30

## 2018-01-05 MED ORDER — FLUCONAZOLE 40 MG/ML PO SUSR
400.0000 mg | Freq: Every day | ORAL | Status: DC
  Administered 2018-01-05: 400 mg
  Filled 2018-01-05 (×2): qty 10

## 2018-01-05 NOTE — Progress Notes (Signed)
Initial Nutrition Assessment  DOCUMENTATION CODES:   Severe malnutrition in context of chronic illness, Underweight  INTERVENTION:   Monitor magnesium, potassium, and phosphorus daily for at least 3 days, MD to replete as needed, as pt is at risk for refeeding syndrome given severe malnutrition and poor PO intake x 9 days.  Provide Nepro @ 20 ml/hr, advance every 12 hours to goal rate of 40 ml/hr. TF regimen + current Propofol infusion will provide 1965 kcal, 77g protein and 697 ml H2O.  NUTRITION DIAGNOSIS:   Severe Malnutrition related to chronic illness(AIDS) as evidenced by percent weight loss, energy intake < 75% for > 7 days, moderate fat depletion, severe muscle depletion.  GOAL:   Patient will meet greater than or equal to 90% of their needs  MONITOR:   Vent status, Labs, I & O's, Weight trends, TF tolerance  REASON FOR ASSESSMENT:   Consult Enteral/tube feeding initiation and management  ASSESSMENT:   57 y.o. male with PMH including AIDS, cocaine abuse and hepatitis B.  Per chart review, pt's partner reported pt had little PO intake for 9 days PTA.  Pt has lost 9 lb since 2/6 (8% wt loss x 2 weeks, significant for time frame).  Patient is currently intubated on ventilator support MV: 13.2 L/min Temp (24hrs), Avg:96.4 F (35.8 C), Min:90.7 F (32.6 C), Max:101.3 F (38.5 C)  Propofol: 9 ml/hr -provides 237 kcal   Medications: Colace liquid BID, Free water flushes 100 ml every 4 hours Labs reviewed: Elevated K, Phos Mg WNL  NUTRITION - FOCUSED PHYSICAL EXAM:    Most Recent Value  Orbital Region  Moderate depletion  Upper Arm Region  Severe depletion  Thoracic and Lumbar Region  Unable to assess  Buccal Region  Moderate depletion  Temple Region  Severe depletion  Clavicle Bone Region  Severe depletion  Clavicle and Acromion Bone Region  Severe depletion  Scapular Bone Region  Severe depletion  Dorsal Hand  Moderate depletion  Patellar Region   Severe depletion  Anterior Thigh Region  Severe depletion  Posterior Calf Region  Severe depletion  Edema (RD Assessment)  None       Diet Order:  Diet NPO time specified  EDUCATION NEEDS:   Not appropriate for education at this time  Skin:  Skin Assessment: Reviewed RN Assessment  Last BM:  PTA  Height:   Ht Readings from Last 1 Encounters:  01/04/18 5\' 6"  (1.676 m)    Weight:   Wt Readings from Last 1 Encounters:  01/04/18 95 lb 14.4 oz (43.5 kg)    Ideal Body Weight:  64.5 kg  BMI:  Body mass index is 15.48 kg/m.  Estimated Nutritional Needs:   Kcal:  1780  Protein:  75-85g  Fluid:  1.8L/day  Tilda FrancoLindsey Greg Eckrich, MS, RD, LDN [facility] Clinical Dietitian Pager: [phone] After Hours Pager: [phone]

## 2018-01-05 NOTE — Procedures (Signed)
EEG Report  Clinical History:  Altered mental status in patient with AIDS, CD4<10, and cryptococcal meningitis.    Technical Summary:  A 19 channel digital EEG recording was performed using the 10-20 international system of electrode placement.  Bipolar and Referential montages were used.  The total recording time was approx 20 minutes.  Findings:  This is a diffusely low voltage recording.  There is a posterior dominant rhythm averaging 8 Hz and symmetrical.  No focal slowing is present.  Photic stimulation and hyperventilation are not performed.  There are no epileptiform discharges or electrographic seizures present.  Drowsiness is recorded, but sleep is not present.   Impression:  This is an essentially normal EEG in the awake and drowsy states.  Suppression of voltages can be an indicator of diffuse cortical dysfunction , as can be seen with fungal meningitis.    Kenneth SettleShervin Shamere Campas, MS,  MD

## 2018-01-05 NOTE — Progress Notes (Signed)
PULMONARY / CRITICAL CARE MEDICINE   Name: Kenneth Rivas MRN: [id number] DOB: 1961/05/16    ADMISSION DATE:  01/04/2018 CONSULTATION DATE:  01/04/18  REFERRING MD:  Juleen China - EDP  CHIEF COMPLAINT:  AMS  HISTORY OF PRESENT ILLNESS: Kenneth Rivas is a 57 y.o. 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  He completed flucytosine on 2/6 and was transitioned to oral fluconazole with plans for 8 weeks of therapy before dose adjustment by ID at outpatient follow up.  SNF was recommended on discharge but pt refused.  He went to live at a boarding house with a roommmate and apparently stopped taking his meds.  Home health nurses visited him on Friday 2/15 and recommended that he come to the ED for evaluation due to generalized weakness but he refused. On 2/18, case worker went to pickup pt for an appointment and found him with profound weakness and AMS.  He was subsequently brought to [facility] ED for further evaluation and management.  In ED, he required intubation due to AMS and inability to protect his airway.  PCCM was subsequently called for admission to the ICU.  Per last ID notes 12/24/17, pt's functional status remained extremely poor.  Of note, he was also seen by psychiatry 12/24/17 for capacity evaluation.  He was deemed to NOT have capacity and it was recommended that he have an authorized representative help make decisions regarding his care.  At time of my exam, patient is intubated and sedated. He flutters eyelids to loud voice but is not obeying commands. RN reports patient's roommate dropped him off at the ER and left. I called the roommate [name], who says that he is not a roommate but is the long-term partner of the  patient. He says they are not legally married though and that the patient has never signed POA paperwork. He reports patient's next of kin is his sister Aggie Cosier, who was unable to be reached tonight. [name] reports that since patient's last hospital discharge, he has been weak and has been having urinary incontinence. He says patient had a nonproductive cough last week. He also reports no PO intake for the past 9 days and that patient hasnt taken his meds for 2 days. He denies that patient has been having any N/V/D, Fever, or Abdominal pain. He says patient has been very cold/chilled lately and has been needing more blankets than usual. Patient was on the way to a doctor's appointment and passed out in the back of the car, so [name] said they detoured to the ER instead.    SUBJECTIVE:  Pt remains sedated / intubated on vent.  On PSV wean 10/5.    VITAL SIGNS: BP (!) 136/100   Pulse (!) 112   Temp (!) 97.5 F (36.4 C)   Resp (!) 22   Ht 5\' 6"  (1.676 m)   Wt 95 lb 14.4 oz (43.5 kg)   SpO2 100%   BMI 15.48 kg/m   VENTILATOR SETTINGS: Vent Mode: PRVC FiO2 (%):  [30 %-40 %] 30 % Set Rate:  [16 bmp-20 bmp] 16 bmp Vt Set:  [520 mL] 520 mL PEEP:  [5 cmH20] 5 cmH20 Plateau Pressure:  [12 cmH20-20 cmH20] 20 cmH20  INTAKE / OUTPUT: I/O last 3 completed  shifts: In: 5365.8 [I.V.:1255.8; NG/GT:200; IV Piggyback:3910] Out: 60 [Urine:60]  PHYSICAL EXAMINATION: General: cachectic male lying in bed on vent  HEENT: MM pink/moist, ETT, pupils =/R Neuro: sedate on propofol  CV: s1s2 rrr, no m/r/g PULM: even/non-labored, lungs bilaterally clear anterior / lateral  GI: soft, non-tender, bsx4 active  Extremities: warm/dry, no edema  Skin: no rashes or lesions  LABS:  BMET Recent Labs  Lab 01/04/18 1637 01/04/18 2016 01/05/18 0450  NA 147* 148* 148*  K 6.1* 5.1 5.2*  CL 116* 121* 116*  CO2 8* 12* 17*  BUN 148* 122* 132*  CREATININE 6.30* 5.33* 5.14*  GLUCOSE 225* 351* 100*     Electrolytes Recent Labs  Lab 01/04/18 1637 01/04/18 2016 01/05/18 0450  CALCIUM 9.7 8.3* 7.8*  MG 3.5* 2.7* 2.3  PHOS  --  9.6* 7.2*    CBC Recent Labs  Lab 01/04/18 1637 01/05/18 0450  WBC 10.3 7.9  HGB 12.6* 8.3*  HCT 38.3* 24.7*  PLT 321 168    Coag's Recent Labs  Lab 01/04/18 1637  INR 1.31    Sepsis Markers Recent Labs  Lab 01/04/18 1854 01/04/18 2016 01/05/18 0450  LATICACIDVEN 3.82* 3.2* 1.9  PROCALCITON  --  3.68 5.43    ABG Recent Labs  Lab 01/04/18 1835 01/04/18 2245 01/05/18 0454  PHART 7.285* 7.289* 7.457*  PCO2ART 21.4* 23.8* 23.6*  PO2ART 221* 183* 133*    Liver Enzymes Recent Labs  Lab 01/04/18 1637  AST 35  ALT 19  ALKPHOS 129*  BILITOT 0.7  ALBUMIN 3.0*    Cardiac Enzymes Recent Labs  Lab 01/04/18 1637  TROPONINI 0.05*   Glucose Recent Labs  Lab 01/04/18 2021 01/04/18 2304 01/05/18 0307 01/05/18 0747  GLUCAP 320* 186* 80 67    Imaging Ct Head Wo Contrast  Result Date: 01/04/2018 CLINICAL DATA:  Altered mental status. HIV positive with history of cryptococcal meningitis. EXAM: CT HEAD WITHOUT CONTRAST TECHNIQUE: Contiguous axial images were obtained from the base of the skull through the vertex without intravenous contrast. COMPARISON:  Head CT 11/26/2017 and MRI brain 06/25/2017 FINDINGS: Brain: Age advanced cerebral atrophy, ventriculomegaly and periventricular white matter disease. No acute intracranial findings or mass lesions. No extra-axial fluid collections. Vascular: No hyperdense vessels or obvious aneurysm. Skull: No skull fracture or bone lesion. Sinuses/Orbits: The paranasal sinuses and mastoid air cells are clear. The globes are intact. Other: No scalp lesions or hematoma. IMPRESSION: Chronic brain changes as described above.  No acute findings. Electronically Signed   By: Rudie Meyer M.D.   On: 01/04/2018 20:54   Dg Chest Port 1 View  Result Date: 01/05/2018 CLINICAL DATA:  Respiratory failure  EXAM: PORTABLE CHEST 1 VIEW COMPARISON:  01/04/2018 FINDINGS: Endotracheal tube is unchanged. NG tube is been placed into the stomach. The side port is near the GE junction. Lungs are clear. Heart is normal size. No effusions or acute bony abnormality. IMPRESSION: No active disease. Electronically Signed   By: Charlett Nose M.D.   On: 01/05/2018 07:26   Dg Chest Portable 1 View  Result Date: 01/04/2018 CLINICAL DATA:  Encounter for endotracheal tube position. EXAM: PORTABLE CHEST 1 VIEW COMPARISON:  11/26/2017 FINDINGS: The heart size and mediastinal contours are within normal limits. Tip of endotracheal tube is 5.4 cm above the carina. Both lungs are hyperinflated but clear. No pneumothorax or effusion. Costophrenic angles are excluded on this study bilaterally. The visualized skeletal structures are unremarkable. IMPRESSION: Satisfactory endotracheal tube position with the tip 5.4 cm  above the carina. Hyperinflated lungs. Electronically Signed   By: Tollie Ethavid  Kwon M.D.   On: 01/04/2018 18:13   Dg Abd Portable 1v  Result Date: 01/04/2018 CLINICAL DATA:  NG tube placement. EXAM: PORTABLE ABDOMEN - 1 VIEW COMPARISON:  None. FINDINGS: The NG tube tip is in the fundal region of the stomach. The proximal port is in the distal esophagus. The tube could be advanced several cm. IMPRESSION: NG tube tip is in the fundal region the stomach with the proximal port in the distal esophagus. Electronically Signed   By: Rudie MeyerP.  Gallerani M.D.   On: 01/04/2018 19:41   STUDIES:  Renal US 2/18 >>  Head CT 2/18 >> chronic changes, no acute findings   CULTURES: Blood 2/18 >> Urine 2/18 >> Sputum 2/19 >> RVP 2/18 >> negative   ANTIBIOTICS: Vanc 2/18 >> 2/19 Zosyn 2/18 >> Diflucan (crypto) 2/18 >>   SIGNIFICANT EVENTS: 2/18 Admitted with acute encephalopathy requiring intubation for failure to protect airway, hypothermic and in septic shock on presentation to ER. BP improved with IVF's.  LINES/TUBES: ETT 2/18 >   Foley catheter 2/18 > OG tube 2/18 >  DISCUSSION: 57 y.o. male with hx AIDS (CD4 < 10) and medication non-compliance, recent cryptococcal meningitis & medical non-compliance.  Just discharged 12/24/17 with plans for 8 weeks fluconazole before dose adjustment by ID.  Admitted 2/18 with AMS and weakness.  Required intubation in ED.  ASSESSMENT / PLAN:  PULMONARY A: Acute Respiratory insufficiency - s/p intubation in ED in setting of encephalopathy  P:   PRVC 8 cc/kg as rest mode SBT / WUA as tolerated  Follow intermittent CXR  VAP prevention measures  CARDIOVASCULAR A:  Troponin bump - suspect demand ischemia Hx HTN, NICM, VT, QTc Prolongation  P:  ICU monitoring  Hold home carvedilol   RENAL A:   Hypernatremia - dehydration from decreased PO intake.  FWD ~ 1.5L. Hyperkalemia - s/p 1g Ca gluconate, 10u insulin, 1amp D50 in ED. Hyperchloremia. AGMA - lactate + uremia. AKI - ? Pre-renal from decreased PO / hypovolemia vs ATN.  Oliguric. Hypermagnesemia. P:   Trend BMP / urinary output Replace electrolytes as indicated Avoid nephrotoxic agents, ensure adequate renal perfusion Free water 100ml Q4 PT  Await renal US  May need Nephrology input  Continue bicarbonate gtt for now  NS 500 ml bolus x1   GASTROINTESTINAL A:   Protein calorie malnutrition. GI prophylaxis. Nutrition. P:   NPO / OGT  Begin TF per Nutrition  Monitor for refeeding syndrome  PPI for SUP   HEMATOLOGIC A:   Anemia - chronic. VTE Prophylaxis. P:  Trend CBC Transfuse for Hgb <7% SCD's + Heparin for DVT prophylaxis  INFECTIOUS A:   Hx recent cryptococcal meningitis (was prescribed 800mg  fluconazole daily x 8 weeks), AIDS (last CD4 count < 10 on 11/27/17), HBV. Septic shock due to UTI P:   ID consulted, appreciate input  Await cultures  Continue diflucan for cryptococcal meningitis (initially started 2/6, planned 8 weeks therapy) Discontinue vancomycin  ABX as above, narrow as able    ENDOCRINE A:   Hyperglycemia.  P:   SSI   NEUROLOGIC A:   Acute encephalopathy - multifactorial, likely exacerbated by worsening cryptococcal meningitis in setting of medication non-compliance. Failure to thrive - per notes from last admit, pts functional status remained extremely poor.  No capacity for decision making - seen by psych on last admit and deemed to NOT have capacity. P:   Sedation:  Propofol gtt  with PRN fentanyl for pain  RASS goal: 0 to -1  Daily WUA / SBT  May need repeat LP ??  Family updated: No family at bedside am 2/19.  He has a long time partner Armed forces technical officer) and sister Aggie Cosier).    Interdisciplinary Family Meeting v Palliative Care Meeting:  Due by: 01/10/18.  CC Time:  30 minutes  Canary Brim, NP-C [facility] Pgr: [phone] or if no answer [phone] 01/05/2018, 8:33 AM

## 2018-01-05 NOTE — Progress Notes (Signed)
eLink Physician-Brief Progress Note Patient Name: Kenneth Rivas DOB: 08-14-1961 MRN: [id number]   Date of Service  01/05/2018  HPI/Events of Note  Oliguria - CVP = 3. Last LVEF = 30% to 35%.  eICU Interventions  Will order: 1. Bolus with 0.9 NaCl 500 mL IV over 30 minutes now.  May repeat bolus X 1 for CVP < 10.      Intervention Category Intermediate Interventions: Oliguria - evaluation and management  Sommer,Steven Eugene 01/05/2018, 12:12 AM

## 2018-01-05 NOTE — Care Management Note (Signed)
Case Management Note  Patient Details  Name: Kenneth Rivas MRN: [id number] Date of Birth: 02/20/1961  Subjective/Objective:  57 y/o m admitted w/Septic shock. Hx: Mengitis,HIV. [facility] but became a non admit-not a safe home environment-needs SNF. APS involved. Currently on vent. CSW notified.Will recc PT cons when appropriate.               Action/Plan:d/c SNF.   Expected Discharge Date:  01/07/18               Expected Discharge Plan:  Skilled Nursing Facility  In-House Referral:  Clinical Social Work  Discharge planning Services  CM Consult  Post Acute Care Choice:    Choice offered to:     DME Arranged:    DME Agency:     HH Arranged:    HH Agency:     Status of Service:  In process, will continue to follow  If discussed at Long Length of Stay Meetings, dates discussed:    Additional Comments:  Lanier ClamMahabir, [name], RN 01/05/2018, 3:35 PM

## 2018-01-05 NOTE — Progress Notes (Signed)
EEG complete. Results pending.  ?

## 2018-01-05 NOTE — Progress Notes (Signed)
eLink Physician-Brief Progress Note Patient Name: Kenneth Rivas DOB: January 01, 1961 MRN: [id number]   Date of Service  01/05/2018  HPI/Events of Note  Oliguria - CVP reported as 5.  eICU Interventions  Will order: 1. Bolus with 0.9 NaCl 1 liter IV over 1 hour now.      Intervention Category Intermediate Interventions: Oliguria - evaluation and management  Nadim Malia Eugene 01/05/2018, 5:50 AM

## 2018-01-05 NOTE — Consult Note (Signed)
[name] for Infectious Disease    Date of Admission:  01/04/2018           Day 1 piperacillin tazobactam       Reason for Consult: Septic shock in the setting of advanced HIV infection and recent cryptococcal meningoencephalitis    Referring Provider: Noe Gens NP  Assessment: The source of his fever and sepsis is unclear.  He does not have any evidence of pneumonia by chest x-ray.  He did not have enough urine output for urine.  Urine and blood cultures are pending.  He may very well have relapsed cryptococcal meningoencephalitis but is also at risk for other HIV related opportunistic infections.  I will obtain AFB blood cultures to screen for Mycobacterium avium infection.  I will leave him on high-dose fluconazole for now and prophylactic doses of trimethoprim sulfamethoxazole and azithromycin.  I agree with continuing piperacillin tazobactam pending culture results and further observation.  Plan: Continue fluconazole Restart trimethoprim sulfamethoxazole and azithromycin Continue piperacillin tazobactam pending culture results   Active Problems:   Septic shock (HCC)   Acute renal insufficiency   Scheduled Meds: . chlorhexidine gluconate (MEDLINE KIT)  15 mL Mouth Rinse BID  . docusate  100 mg Per Tube BID  . feeding supplement (NEPRO CARB STEADY)  1,000 mL Oral Q24H  . fluconazole  400 mg Per Tube Daily  . free water  100 mL Per Tube Q4H  . heparin  5,000 Units Subcutaneous Q8H  . insulin aspart  1-3 Units Subcutaneous Q4H  . mouth rinse  15 mL Mouth Rinse QID  . sodium chloride flush  10-40 mL Intracatheter Q12H   Continuous Infusions: . sodium chloride    . piperacillin-tazobactam (ZOSYN)  IV Stopped (01/05/18 1058)  . propofol (DIPRIVAN) infusion 30 mcg/kg/min (01/05/18 1230)  .  sodium bicarbonate (isotonic) infusion in sterile water 125 mL/hr at 01/05/18 1341   PRN Meds:.sodium chloride, acetaminophen, fentaNYL (SUBLIMAZE) injection, fentaNYL  (SUBLIMAZE) injection, midazolam, ondansetron (ZOFRAN) IV, sodium chloride flush  HPI: Kenneth Rivas is a 57 y.o. male with advanced, uncontrolled HIV infection.  He was hospitalized last fall and underwent induction therapy for cryptococcal meningitis with amphotericin and flucytosine.  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  He completed a second round of induction therapy on 12/21/2017 and transition to high-dose fluconazole.  He was seen by psychiatry on 12/24/2017 and was felt to lack the capacity to make medical decisions for himself.  Skilled nursing facility placement was recommended but he was discharged to his boarding home.  Apparently he was not declining health and not eating or drinking for the past week.  He has been followed closely by the bridge counselor from our clinic, [name] McGee.  Rehospitalization was recommended last week but he refused.  [name] went to pick him up yesterday for an appointment in our clinic but he was too lethargic and was therefore brought to the emergency department where he was found to be in septic shock.  He was hypothermic, hypotensive, hyperkalemic with acute renal insufficiency.  He had increased work of breathing and his oxygen saturation could not be measured so he was in the emergency department.  He is now sedated and on the ventilator in the ICU.  He spiked a temperature to 101.3 degrees overnight.  ED notes indicate that he has not been taking his medications.  I could not reach his significant other to  discuss this.   Review of Systems: Review of Systems  Unable to perform ROS: Intubated    Past Medical History:  Diagnosis Date  . Acute renal insufficiency 01/05/2018  . AIDS ([place])   . Anemia   . Anxiety   . Cellulitis of right hand 06/23/2017  . Hepatitis B   . HIV disease ([place])   . Hypertension   . Immune deficiency disorder [facility])     Social History   Tobacco Use  .  Smoking status: Current Some Day Smoker    Packs/day: 0.30    Types: Cigarettes  . Smokeless tobacco: Never Used  . Tobacco comment: pt. given quit line information  Substance Use Topics  . Alcohol use: Yes    Alcohol/week: 2.0 oz    Types: 4 Standard drinks or equivalent per week    Comment: beer  . Drug use: Yes    Types: Cocaine    Comment: crack- last use 03/2012. no h/o IVDU, other illicits    Family History  Problem Relation Age of Onset  . Hypertension Mother    No Known Allergies  OBJECTIVE: Blood pressure 92/74, pulse (!) 119, temperature 100 F (37.8 C), resp. rate (!) 25, height '5\' 6"'$  (1.676 m), weight 95 lb 14.4 oz (43.5 kg), SpO2 100 %.  Physical Exam  Constitutional:  He is sedated on the ventilator.  He is cachectic.  Cardiovascular: Regular rhythm.  No murmur heard. He is tachycardic.  Heart sounds are distant.  Pulmonary/Chest: He has no wheezes. He has no rales.  Abdominal: Soft.  Musculoskeletal: He exhibits no edema or tenderness.  Skin: No rash noted.    Lab Results Lab Results  Component Value Date   WBC 7.9 01/05/2018   HGB 8.3 (L) 01/05/2018   HCT 24.7 (L) 01/05/2018   MCV 80.7 01/05/2018   PLT 168 01/05/2018    Lab Results  Component Value Date   CREATININE 5.14 (H) 01/05/2018   BUN 132 (H) 01/05/2018   NA 148 (H) 01/05/2018   K 5.2 (H) 01/05/2018   CL 116 (H) 01/05/2018   CO2 17 (L) 01/05/2018    Lab Results  Component Value Date   ALT 19 01/04/2018   AST 35 01/04/2018   ALKPHOS 129 (H) 01/04/2018   BILITOT 0.7 01/04/2018     Microbiology: Recent Results (from the past 240 hour(s))  Respiratory Panel by PCR     Status: None   Collection Time: 01/04/18  8:20 PM  Result Value Ref Range Status   Adenovirus NOT DETECTED NOT DETECTED Final   Coronavirus 229E NOT DETECTED NOT DETECTED Final   Coronavirus HKU1 NOT DETECTED NOT DETECTED Final   Coronavirus NL63 NOT DETECTED NOT DETECTED Final   Coronavirus OC43 NOT DETECTED NOT  DETECTED Final   Metapneumovirus NOT DETECTED NOT DETECTED Final   Rhinovirus / Enterovirus NOT DETECTED NOT DETECTED Final   Influenza A NOT DETECTED NOT DETECTED Final   Influenza B NOT DETECTED NOT DETECTED Final   Parainfluenza Virus 1 NOT DETECTED NOT DETECTED Final   Parainfluenza Virus 2 NOT DETECTED NOT DETECTED Final   Parainfluenza Virus 3 NOT DETECTED NOT DETECTED Final   Parainfluenza Virus 4 NOT DETECTED NOT DETECTED Final   Respiratory Syncytial Virus NOT DETECTED NOT DETECTED Final   Bordetella pertussis NOT DETECTED NOT DETECTED Final   Chlamydophila pneumoniae NOT DETECTED NOT DETECTED Final   Mycoplasma pneumoniae NOT DETECTED NOT DETECTED Final  MRSA PCR Screening     Status: None  Collection Time: 01/04/18  8:21 PM  Result Value Ref Range Status   MRSA by PCR NEGATIVE NEGATIVE Final    Comment:        The GeneXpert MRSA Assay (FDA approved for NASAL specimens only), is one component of a comprehensive MRSA colonization surveillance program. It is not intended to diagnose MRSA infection nor to guide or monitor treatment for MRSA infections. Performed at [facility], [place] [address] [place], [place] [address]     Michel Bickers, [place] [facility] [phone] pager   [phone] cell 01/05/2018, 2:24 PM

## 2018-01-06 ENCOUNTER — Inpatient Hospital Stay (HOSPITAL_COMMUNITY): Payer: Medicaid Other

## 2018-01-06 ENCOUNTER — Inpatient Hospital Stay (HOSPITAL_COMMUNITY): Payer: Medicaid Other | Admitting: Certified Registered Nurse Anesthetist

## 2018-01-06 LAB — CBC
HEMATOCRIT: 18.5 % — AB (ref 39.0–52.0)
HEMOGLOBIN: 6.4 g/dL — AB (ref 13.0–17.0)
MCH: 27.9 pg (ref 26.0–34.0)
MCHC: 34.6 g/dL (ref 30.0–36.0)
MCV: 80.8 fL (ref 78.0–100.0)
Platelets: 139 10*3/uL — ABNORMAL LOW (ref 150–400)
RBC: 2.29 MIL/uL — AB (ref 4.22–5.81)
RDW: 14.9 % (ref 11.5–15.5)
WBC: 5.2 10*3/uL (ref 4.0–10.5)

## 2018-01-06 LAB — URINE CULTURE

## 2018-01-06 LAB — GLUCOSE, CAPILLARY
GLUCOSE-CAPILLARY: 122 mg/dL — AB (ref 65–99)
GLUCOSE-CAPILLARY: 114 mg/dL — AB (ref 65–99)
GLUCOSE-CAPILLARY: 101 mg/dL — AB (ref 65–99)
Glucose-Capillary: 82 mg/dL (ref 65–99)
Glucose-Capillary: 79 mg/dL (ref 65–99)
Glucose-Capillary: 72 mg/dL (ref 65–99)

## 2018-01-06 LAB — COMPREHENSIVE METABOLIC PANEL
ALT: 502 U/L — ABNORMAL HIGH (ref 17–63)
AST: 759 U/L — ABNORMAL HIGH (ref 15–41)
Albumin: 1.8 g/dL — ABNORMAL LOW (ref 3.5–5.0)
Alkaline Phosphatase: 83 U/L (ref 38–126)
Anion gap: 13 (ref 5–15)
BUN: 107 mg/dL — ABNORMAL HIGH (ref 6–20)
CO2: 26 mmol/L (ref 22–32)
Calcium: 6.1 mg/dL — CL (ref 8.9–10.3)
Chloride: 107 mmol/L (ref 101–111)
Creatinine, Ser: 4.47 mg/dL — ABNORMAL HIGH (ref 0.61–1.24)
GFR calc Af Amer: 15 mL/min — ABNORMAL LOW (ref >60)
GFR calc non Af Amer: 13 mL/min — ABNORMAL LOW (ref >60)
Glucose, Bld: 118 mg/dL — ABNORMAL HIGH (ref 65–99)
Potassium: 4.2 mmol/L (ref 3.5–5.1)
Sodium: 146 mmol/L — ABNORMAL HIGH (ref 135–145)
Total Bilirubin: 0.7 mg/dL (ref 0.3–1.2)
Total Protein: 5.9 g/dL — ABNORMAL LOW (ref 6.5–8.1)

## 2018-01-06 LAB — PREPARE RBC (CROSSMATCH)

## 2018-01-06 LAB — PROCALCITONIN: Procalcitonin: 6.19 ng/mL

## 2018-01-06 LAB — CSF CELL COUNT WITH DIFFERENTIAL
Lymphs, CSF: 79 % (ref 40–80)
Monocyte-Macrophage-Spinal Fluid: 15 % (ref 15–45)
RBC Count, CSF: 14 /mm3 — ABNORMAL HIGH
Segmented Neutrophils-CSF: 6 % (ref 0–6)
Tube #: 1
WBC CSF: 10 /mm3 — AB (ref 0–5)

## 2018-01-06 LAB — ABO/RH: ABO/RH(D): O POS

## 2018-01-06 LAB — PROTEIN AND GLUCOSE, CSF
Glucose, CSF: 42 mg/dL (ref 40–70)
TOTAL PROTEIN, CSF: 76 mg/dL — AB (ref 15–45)

## 2018-01-06 LAB — CRYPTOCOCCAL ANTIGEN, CSF
CRYPTO AG: POSITIVE — AB
Cryptococcal Ag Titer: >2560 — AB

## 2018-01-06 LAB — MAGNESIUM: Magnesium: 1.8 mg/dL (ref 1.7–2.4)

## 2018-01-06 LAB — PHOSPHORUS: Phosphorus: 4.7 mg/dL — ABNORMAL HIGH (ref 2.5–4.6)

## 2018-01-06 MED ORDER — DIPHENHYDRAMINE HCL 50 MG/ML IJ SOLN: 25.0000 mg | Freq: Every day | INTRAMUSCULAR | Status: DC | PRN

## 2018-01-06 MED ORDER — MEPERIDINE HCL 25 MG/ML IJ SOLN: 25.0000 mg | INTRAMUSCULAR | Status: DC | PRN

## 2018-01-06 MED ORDER — DEXTROSE 5% FOR FLUSHING BEFORE AND AFTER AMBISOME
10.0000 mL | INTRAVENOUS | Status: DC
  Administered 2018-01-06 – 2018-01-10 (×5): 10 mL via INTRAVENOUS
  Filled 2018-01-06: qty 10
  Filled 2018-01-06 (×2): qty 50
  Filled 2018-01-06 (×4): qty 10
  Filled 2018-01-06: qty 50

## 2018-01-06 MED ORDER — DEXTROSE 5% FOR FLUSHING BEFORE AND AFTER AMBISOME
10.0000 mL | INTRAVENOUS | Status: DC
  Administered 2018-01-06 – 2018-01-10 (×5): 10 mL via INTRAVENOUS
  Filled 2018-01-06 (×2): qty 50
  Filled 2018-01-06 (×2): qty 10
  Filled 2018-01-06: qty 50
  Filled 2018-01-06 (×3): qty 10

## 2018-01-06 MED ORDER — SODIUM CHLORIDE 0.9 % IV SOLN: 500.0000 mL | INTRAVENOUS | Status: DC

## 2018-01-06 MED ORDER — CHLORHEXIDINE GLUCONATE 0.12 % MT SOLN
15.0000 mL | Freq: Two times a day (BID) | OROMUCOSAL | Status: DC
  Administered 2018-01-07 – 2018-01-15 (×16): 15 mL via OROMUCOSAL
  Filled 2018-01-06 (×13): qty 15

## 2018-01-06 MED ORDER — ACETAMINOPHEN 325 MG PO TABS: 650.0000 mg | ORAL_TABLET | Freq: Every day | ORAL | Status: DC | PRN

## 2018-01-06 MED ORDER — SODIUM CHLORIDE 0.9 % IV SOLN
500.0000 mL | INTRAVENOUS | Status: DC
  Administered 2018-01-06 – 2018-01-10 (×5): 500 mL via INTRAVENOUS

## 2018-01-06 MED ORDER — DEXTROSE 5 % IV SOLN
4.0000 mg/kg | INTRAVENOUS | Status: DC
  Administered 2018-01-06 – 2018-01-10 (×5): 200 mg via INTRAVENOUS
  Filled 2018-01-06 (×5): qty 200

## 2018-01-06 MED ORDER — FLUCYTOSINE 250 MG PO CAPS
25.0000 mg/kg | ORAL_CAPSULE | Freq: Four times a day (QID) | ORAL | Status: DC
  Administered 2018-01-06 – 2018-01-11 (×16): 1250 mg via ORAL
  Filled 2018-01-06 (×20): qty 5

## 2018-01-06 MED ORDER — LIDOCAINE HCL (PF) 1 % IJ SOLN
INTRAMUSCULAR | Status: AC
  Administered 2018-01-06: 3 mL
  Filled 2018-01-06: qty 5

## 2018-01-06 MED ORDER — SODIUM CHLORIDE 0.9 % IV SOLN
INTRAVENOUS | Status: DC
Start: 2018-01-06 — End: 2018-01-07
  Administered 2018-01-06: 11:00:00 via INTRAVENOUS

## 2018-01-06 MED ORDER — SODIUM CHLORIDE 0.9 % IV SOLN: Freq: Once | INTRAVENOUS | Status: DC

## 2018-01-06 MED ORDER — ORAL CARE MOUTH RINSE
15.0000 mL | Freq: Two times a day (BID) | OROMUCOSAL | Status: DC
  Administered 2018-01-06 – 2018-01-15 (×11): 15 mL via OROMUCOSAL

## 2018-01-06 MED ORDER — HYDRALAZINE HCL 20 MG/ML IJ SOLN
10.0000 mg | INTRAMUSCULAR | Status: DC | PRN
  Administered 2018-01-06: 20 mg via INTRAVENOUS
  Administered 2018-01-07: 40 mg via INTRAVENOUS
  Administered 2018-01-07: 20 mg via INTRAVENOUS
  Filled 2018-01-06 (×2): qty 1
  Filled 2018-01-06: qty 2

## 2018-01-06 MED ORDER — DIPHENHYDRAMINE HCL 25 MG PO CAPS: 25.0000 mg | ORAL_CAPSULE | Freq: Every day | ORAL | Status: DC | PRN

## 2018-01-06 MED ORDER — FAMOTIDINE IN NACL 20-0.9 MG/50ML-% IV SOLN
20.0000 mg | Freq: Two times a day (BID) | INTRAVENOUS | Status: DC
  Administered 2018-01-06: 20 mg via INTRAVENOUS
  Filled 2018-01-06 (×2): qty 50

## 2018-01-06 MED ORDER — FLUCYTOSINE 500 MG PO CAPS
25.0000 mg/kg | ORAL_CAPSULE | Freq: Four times a day (QID) | ORAL | Status: DC
  Filled 2018-01-06: qty 1

## 2018-01-06 MED ORDER — FAMOTIDINE IN NACL 20-0.9 MG/50ML-% IV SOLN
20.0000 mg | INTRAVENOUS | Status: DC
  Administered 2018-01-07 – 2018-01-08 (×2): 20 mg via INTRAVENOUS
  Filled 2018-01-06 (×2): qty 50

## 2018-01-06 NOTE — Progress Notes (Signed)
Patient ID: Kenneth Rivas, male   DOB: 1961/05/08, 57 y.o.   MRN: [id number]          [facility] for Infectious Disease  Date of Admission:  01/04/2018           Day 2 piperacillin tazobactam ASSESSMENT: He continues to do poorly.  I was unable to reach his significant other again today.  However, I strongly suspect that he was not adherent with his medications following discharge on 12/24/2017.  He was readmitted with hypothermia, shock and a lactic acid greater than 9.  Upon this admission his liver transplant today have jumped up significantly.  I doubt that this is related to fluconazole.  It is more likely related to hypoperfusion.  Nonetheless I am concerned about relapsed cryptococcal meningoencephalitis and will restart amphotericin and 5 flucytosine pending the results of today's lumbar puncture.  PLAN: 1. Restart amphotericin and 5 flucytosine 2. Continue piperacillin tazobactam pending final culture results  Active Problems:   Septic shock (HCC)   Acute renal insufficiency   Scheduled Meds: . azithromycin  1,200 mg Per Tube Weekly  . chlorhexidine  15 mL Mouth Rinse BID  . feeding supplement (NEPRO CARB STEADY)  1,000 mL Oral Q24H  . free water  100 mL Per Tube Q4H  . insulin aspart  1-3 Units Subcutaneous Q4H  . mouth rinse  15 mL Mouth Rinse q12n4p  . sodium chloride flush  10-40 mL Intracatheter Q12H  . sulfamethoxazole-trimethoprim  1 tablet Oral Once per day on Mon Wed Fri   Continuous Infusions: . sodium chloride    . sodium chloride    . sodium chloride 50 mL/hr at 01/06/18 1100  . [START ON 01/07/2018] famotidine (PEPCID) IV    . piperacillin-tazobactam (ZOSYN)  IV Stopped (01/06/18 1103)   PRN Meds:.sodium chloride, acetaminophen, ondansetron (ZOFRAN) IV, sodium chloride flush  Review of Systems: Review of Systems  Unable to perform ROS: Intubated    No Known Allergies  OBJECTIVE: Vitals:   01/06/18 0900 01/06/18 1000 01/06/18 1100 01/06/18 1137    BP: (!) 148/95 (!) 162/90 (!) 150/91 (!) 150/91  Pulse: 97 93 95 92  Resp: (!) 21 19 18    Temp: 98.1 F (36.7 C) 97.9 F (36.6 C) (!) 97.5 F (36.4 C)   TempSrc:      SpO2: 100% 100% 100% 100%  Weight:      Height:       Body mass index is 17.4 kg/m.  Physical Exam  Constitutional:  He remains on the ventilator.  He is cachectic.  He will attempt to open his eyes when I call his name.  Cardiovascular: Normal rate and regular rhythm.  No murmur heard. Pulmonary/Chest: Effort normal. He has no wheezes. He has no rales.  Abdominal: Soft.  Skin: No rash noted.    Lab Results Lab Results  Component Value Date   WBC 5.2 01/06/2018   HGB 6.4 (LL) 01/06/2018   HCT 18.5 (L) 01/06/2018   MCV 80.8 01/06/2018   PLT 139 (L) 01/06/2018    Lab Results  Component Value Date   CREATININE 4.47 (H) 01/06/2018   BUN 107 (H) 01/06/2018   NA 146 (H) 01/06/2018   K 4.2 01/06/2018   CL 107 01/06/2018   CO2 26 01/06/2018    Lab Results  Component Value Date   ALT 502 (H) 01/06/2018   AST 759 (H) 01/06/2018   ALKPHOS 83 01/06/2018   BILITOT 0.7 01/06/2018     Microbiology: Recent  Results (from the past 240 hour(s))  Culture, blood (Routine x 2)     Status: None (Preliminary result)   Collection Time: 01/04/18  3:53 PM  Result Value Ref Range Status   Specimen Description   Final    BLOOD RIGHT ARM Performed at [facility], [address] [place], [place] [address]    Special Requests   Final    IN PEDIATRIC BOTTLE Blood Culture adequate volume Performed at [facility], [address] [place], [place] [address]    Culture   Final    NO GROWTH 2 DAYS Performed at [facility], [address] [place], [place] [address]    Report Status PENDING  Incomplete  Respiratory Panel by PCR     Status: None   Collection Time: 01/04/18  8:20 PM  Result Value Ref Range Status   Adenovirus NOT DETECTED NOT DETECTED Final   Coronavirus  229E NOT DETECTED NOT DETECTED Final   Coronavirus HKU1 NOT DETECTED NOT DETECTED Final   Coronavirus NL63 NOT DETECTED NOT DETECTED Final   Coronavirus OC43 NOT DETECTED NOT DETECTED Final   Metapneumovirus NOT DETECTED NOT DETECTED Final   Rhinovirus / Enterovirus NOT DETECTED NOT DETECTED Final   Influenza A NOT DETECTED NOT DETECTED Final   Influenza B NOT DETECTED NOT DETECTED Final   Parainfluenza Virus 1 NOT DETECTED NOT DETECTED Final   Parainfluenza Virus 2 NOT DETECTED NOT DETECTED Final   Parainfluenza Virus 3 NOT DETECTED NOT DETECTED Final   Parainfluenza Virus 4 NOT DETECTED NOT DETECTED Final   Respiratory Syncytial Virus NOT DETECTED NOT DETECTED Final   Bordetella pertussis NOT DETECTED NOT DETECTED Final   Chlamydophila pneumoniae NOT DETECTED NOT DETECTED Final   Mycoplasma pneumoniae NOT DETECTED NOT DETECTED Final  Culture, Urine     Status: Abnormal   Collection Time: 01/04/18  8:20 PM  Result Value Ref Range Status   Specimen Description   Final    URINE, CATHETERIZED Performed at [facility], [address] [place], [place] [address]    Special Requests   Final    NONE Performed at [facility], [address] [place], [place] [address]    Culture MULTIPLE SPECIES PRESENT, SUGGEST RECOLLECTION (A)  Final   Report Status 01/06/2018 FINAL  Final  MRSA PCR Screening     Status: None   Collection Time: 01/04/18  8:21 PM  Result Value Ref Range Status   MRSA by PCR NEGATIVE NEGATIVE Final    Comment:        The GeneXpert MRSA Assay (FDA approved for NASAL specimens only), is one component of a comprehensive MRSA colonization surveillance program. It is not intended to diagnose MRSA infection nor to guide or monitor treatment for MRSA infections. Performed at [facility], [address] [place], [place] [address]   Culture, blood (Routine x 2)     Status: None (Preliminary result)    Collection Time: 01/05/18  3:25 AM  Result Value Ref Range Status   Specimen Description   Final    BLOOD LEFT ANTECUBITAL Performed at [facility], [address] [place], [place] [address]    Special Requests   Final    BOTTLES DRAWN AEROBIC AND ANAEROBIC Blood Culture adequate volume Performed at [facility], [address] [place], [place] [address]    Culture   Final    NO GROWTH 1 DAY Performed at  [facility], [address] [place], [place], [place] [address]    Report Status PENDING  Incomplete    [name], MD [facility] for Infectious Disease [facility] [phone] pager   [phone] cell 01/06/2018, 2:21 PM

## 2018-01-06 NOTE — Progress Notes (Signed)
Verbal orders given to obtain PIV sites and to discontinue central.  Orders executed and documented.

## 2018-01-06 NOTE — Procedures (Signed)
Extubation Procedure Note  Patient Details:   Name: Kenneth Rivas DOB: 07-09-1961 MRN: [id number]   Airway Documentation:     Evaluation  O2 sats: stable throughout Complications: No apparent complications Patient did tolerate procedure well. Bilateral Breath Sounds: Diminished   No    Katheren Shamsowell, Zayley Arras Farmer 01/06/2018, 4:01 PM

## 2018-01-06 NOTE — Progress Notes (Signed)
eLink Physician-Brief Progress Note Patient Name: Kenneth Rivas DOB: 1961-07-09 MRN: [id number]   Date of Service  01/06/2018  HPI/Events of Note  hgb 6.4 this am  eICU Interventions  Transfusion of 1 unit PRBC ordered     Intervention Category Intermediate Interventions: Other:  Kenneth Rivas, Kenneth Rivas, P 01/06/2018, 6:20 AM

## 2018-01-06 NOTE — Progress Notes (Signed)
PULMONARY / CRITICAL CARE MEDICINE   Name: Kenneth Rivas MRN: [id number] DOB: 1961/05/14    ADMISSION DATE:  01/04/2018 CONSULTATION DATE:  01/04/18  REFERRING MD:  Juleen ChinaKohut - EDP  CHIEF COMPLAINT:  AMS  HISTORY OF PRESENT ILLNESS: Kenneth Reedylonzo Mcquown is a 57 y.o. 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  He completed flucytosine on 2/6 and was transitioned to oral fluconazole with plans for 8 weeks of therapy before dose adjustment by ID at outpatient follow up.  SNF was recommended on discharge but pt refused.  He went to live at a boarding house with a roommmate and apparently stopped taking his meds.  Home health nurses visited him on Friday 2/15 and recommended that he come to the ED for evaluation due to generalized weakness but he refused. On 2/18, case worker went to pickup pt for an appointment and found him with profound weakness and AMS.  [facility] and management.  In ED, he required intubation due to AMS and inability to protect his airway.  PCCM was subsequently called for admission to the ICU.  Per last ID notes 12/24/17, pt's functional status remained extremely poor.  Of note, he was also seen by psychiatry 12/24/17 for capacity evaluation.  He was deemed to NOT have capacity and it was recommended that he have an authorized representative help make decisions regarding his care.  At time of my exam, patient is intubated and sedated. He flutters eyelids to loud voice but is not obeying commands. RN reports patient's roommate dropped him off at the ER and left. I called the roommate Clide CliffRicky, who says that he is not a roommate but is the long-term partner of the  patient. He says they are not legally married though and that the patient has never signed POA paperwork. He reports patient's next of kin is his sister Aggie Cosierheresa, who was unable to be reached tonight. Clide CliffRicky reports that since patient's last hospital discharge, he has been weak and has been having urinary incontinence. He says patient had a nonproductive cough last week. He also reports no PO intake for the past 9 days and that patient hasnt taken his meds for 2 days. He denies that patient has been having any N/V/D, Fever, or Abdominal pain. He says patient has been very cold/chilled lately and has been needing more blankets than usual. Patient was on the way to a doctor's appointment and passed out in the back of the car, so Clide CliffRicky said they detoured to the ER instead.    SUBJECTIVE:  Pt remains on propofol at 18 mcg.  Weaning on 5/5.  Pharmacy raises concern regarding increased LFT's.  ETT kinked overnight / replaced over bougie.    VITAL SIGNS: BP 136/83   Pulse (!) 101   Temp 99 F (37.2 C)   Resp (!) 27   Ht 5\' 6"  (1.676 m)   Wt 107 lb 12.9 oz (48.9 kg)   SpO2 100%   BMI 17.40 kg/m   VENTILATOR SETTINGS: Vent Mode: CPAP;PSV FiO2 (%):  [30 %] 30 % Set Rate:  [16 bmp] 16 bmp Vt Set:  [520 mL] 520 mL PEEP:  [5 cmH20] 5 cmH20 Pressure Support:  [5 cmH20] 5  cmH20 Plateau Pressure:  [14 cmH20-15 cmH20] 15 cmH20  INTAKE / OUTPUT: I/O last 3 completed shifts: In: 8690.3 [I.V.:4366.9; NG/GT:1263.3; IV Piggyback:3060] Out: 1166 [Urine:1165; Stool:1]  PHYSICAL EXAMINATION: General: cachectic adult male lying in bed on vent HEENT: MM pink/moist, ETT Neuro: sedate, eyes open to voice  CV: s1s2 rrr, no m/r/g PULM: even/non-labored, lungs bilaterally clear  WU:JWJX, non-tender, bsx4 active  Extremities: warm/dry, no edema  Skin: no rashes or lesions   LABS:  BMET Recent Labs  Lab 01/04/18 2016 01/05/18 0450 01/06/18 0500  NA 148* 148* 146*  K 5.1 5.2* 4.2  CL 121* 116* 107   CO2 12* 17* 26  BUN 122* 132* 107*  CREATININE 5.33* 5.14* 4.47*  GLUCOSE 351* 100* 118*    Electrolytes Recent Labs  Lab 01/04/18 2016 01/05/18 0450 01/05/18 1031 01/05/18 1725 01/06/18 0500  CALCIUM 8.3* 7.8*  --   --  6.1*  MG 2.7* 2.3 2.2 2.2 1.8  PHOS 9.6* 7.2* 6.2* 5.3* 4.7*    CBC Recent Labs  Lab 01/04/18 1637 01/05/18 0450 01/06/18 0500  WBC 10.3 7.9 5.2  HGB 12.6* 8.3* 6.4*  HCT 38.3* 24.7* 18.5*  PLT 321 168 139*    Coag's Recent Labs  Lab 01/04/18 1637  INR 1.31    Sepsis Markers Recent Labs  Lab 01/04/18 1854 01/04/18 2016 01/05/18 0450 01/06/18 0500  LATICACIDVEN 3.82* 3.2* 1.9  --   PROCALCITON  --  3.68 5.43 6.19    ABG Recent Labs  Lab 01/04/18 1835 01/04/18 2245 01/05/18 0454  PHART 7.285* 7.289* 7.457*  PCO2ART 21.4* 23.8* 23.6*  PO2ART 221* 183* 133*    Liver Enzymes Recent Labs  Lab 01/04/18 1637 01/06/18 0500  AST 35 759*  ALT 19 502*  ALKPHOS 129* 83  BILITOT 0.7 0.7  ALBUMIN 3.0* 1.8*    Cardiac Enzymes Recent Labs  Lab 01/04/18 1637  TROPONINI 0.05*   Glucose Recent Labs  Lab 01/05/18 1109 01/05/18 1545 01/05/18 1927 01/05/18 2310 01/06/18 0322 01/06/18 0748  GLUCAP 113* 97 85 127* 122* 82    Imaging Dg Abd 1 View  Result Date: 01/06/2018 CLINICAL DATA:  OG tube placement. EXAM: ABDOMEN - 1 VIEW COMPARISON:  Radiograph 01/04/2018 FINDINGS: Tip of the enteric tube below the diaphragm in the stomach, side-port in the region of the a gastroesophageal junction, advancement of least 3 cm would lead to optimal placement. Right femoral catheter tip is in the left pelvis directed inferiorly likely in the left iliac vessels. No bowel dilatation to suggest obstruction. Phleboliths in the pelvis. IMPRESSION: 1. Side port of the enteric tube in the region of the gastroesophageal junction, recommend advancement of at least 3 cm. 2. Right femoral catheter in place, tip to the left of midline directed inferiorly  in the region of the left common iliac vessels. Recommend repositioning. Electronically Signed   By: Rubye Oaks M.D.   On: 01/06/2018 04:26   US Renal  Result Date: 01/05/2018 CLINICAL DATA:  Acute kidney injury EXAM: RENAL / URINARY TRACT ULTRASOUND COMPLETE COMPARISON:  Overlapping portions from CT chest dated 06/17/2017 FINDINGS: Right Kidney: Length: 10.9 cm. The upper pole the right kidney seems slightly hypoechoic with respect to the lower pole, although some of this may be secondary to poor characterization of the upper pole. No mass or hydronephrosis visualized. No stones are noted. Left Kidney: Length: 9.6 cm. Heterogeneity in parenchymal echogenicity with the lower pole seemingly slightly hypoechoic compared to the upper pole, significance uncertain. No mass  or hydronephrosis visualized. Bladder: The bladder is empty and appears otherwise normal for degree of bladder distention. A Foley catheter is in place. IMPRESSION: 1. Mild heterogeneity and renal parenchymal echogenicity bilaterally as noted above. Possibilities might include glomerular nephritis or pyelonephritis. No definite perirenal fluid collection. Correlate with urine analysis in determining whether further workup is warranted. 2. No stones or hydronephrosis identified. Electronically Signed   By: Gaylyn Rong M.D.   On: 01/05/2018 10:50   Dg Chest Port 1 View  Result Date: 01/06/2018 CLINICAL DATA:  Re-intubation. Acute respiratory failure with hypoxia. EXAM: PORTABLE CHEST 1 VIEW COMPARISON:  Radiograph yesterday at 0432 hour FINDINGS: Endotracheal tube 4.7 cm from the carina. Enteric tube in place with side-port in the region of the gastroesophageal junction. Mild hyperinflation. Mild infrahilar atelectasis. No focal airspace opacity. No pleural fluid or pneumothorax. IMPRESSION: 1. Endotracheal tube tip at the thoracic inlet. Enteric tube in place. 2. Mild infrahilar atelectasis. Electronically Signed   By: Rubye Oaks M.D.   On: 01/06/2018 04:30   STUDIES:  Renal US 2/18 >> mild heterogeneity & renal parenchymal echogenicity bilaterally (possible glomerular nephritis or pyelonephritis), no stones or hydronephrosis Head CT 2/18 >> chronic changes, no acute findings  EEG 2/19 >> normal EEG  CULTURES: Blood 2/18 >>  Urine 2/18 >> multiple species Sputum 2/19 >> RVP 2/18 >> negative  AFB Blood 2/19 >>  UA 2/20 >>  CMV PCR 2/20 >>   ANTIBIOTICS: Vanc 2/18 >> 2/19 Zosyn 2/18 >> Diflucan (crypto) 2/18 >> 2/20 (increased LFT's) Bactrim (prophylaxis) >>   SIGNIFICANT EVENTS: 2/18 Admitted with acute encephalopathy requiring intubation for failure to protect airway, hypothermic and in septic shock on presentation to ER. BP improved with IVF's.  LINES/TUBES: ETT 2/18 >> 2/20 exchanged, 2/20 >>  Foley catheter 2/18 >> OG tube 2/18 >>  DISCUSSION: 57 y.o. male with hx AIDS (CD4 < 10) and medication non-compliance, recent cryptococcal meningitis & medical non-compliance.  Just discharged 12/24/17 with plans for 8 weeks fluconazole before dose adjustment by ID.  Admitted 2/18 with AMS and weakness.  Required intubation in ED.  ASSESSMENT / PLAN:  PULMONARY A: Acute Respiratory insufficiency - s/p intubation in ED in setting of encephalopathy  P:   PRVC 8 cc/kg  Daily SBT / WUA  Pending sedation holiday, may be able to extubate  Follow intermittent CXR  VAP prevention measures   CARDIOVASCULAR A:  Troponin bump - suspect demand ischemia Hx HTN, NICM, VT, QTc Prolongation  P:  ICU monitoring  Hold home carvedilol    RENAL A:   Hypernatremia - dehydration from decreased PO intake.  FWD ~ 1.5L. Hyperkalemia - s/p 1g Ca gluconate, 10u insulin, 1amp D50 in ED. Hyperchloremia. AGMA - lactate + uremia. AKI - ? Pre-renal from decreased PO / hypovolemia vs ATN.  Oliguric, improved 2/20 Hypermagnesemia. P:   Trend BMP / urinary output Replace electrolytes as indicated Avoid nephrotoxic  agents, ensure adequate renal perfusion Free water 100 ml Q4 PT  Renal US as above, repeat UA  Discontinue bicarbonate gtt  NS @ 50 ml/hr   GASTROINTESTINAL A:   Protein calorie malnutrition. GI prophylaxis. Nutrition. P:   NPO / OGT  TF per Nutrition  Monitor for refeeding syndrome  PPI for SUP   HEMATOLOGIC A:   Anemia - chronic. VTE Prophylaxis. P:  Trend CBC  Transfuse for Hgb <7%, 1 unit PRBC 2/20 am  Hold heparin SQ given drop in Hgb SCD's for DVT prophylaxis  INFECTIOUS A:   Hx recent cryptococcal meningitis (was prescribed 800mg  fluconazole daily x 8 weeks), AIDS (last CD4 count < 10 on 11/27/17), HBV. Septic shock due to UTI P:   ID consulted, appreciate input  Anticipate need for LP  Assess CMV PCR Await cultures Hold diflucan given rise in LFT's  ENDOCRINE A:   Hyperglycemia.  P:   SSI   NEUROLOGIC A:   Acute encephalopathy - multifactorial, likely exacerbated by worsening cryptococcal meningitis in setting of medication non-compliance. Failure to thrive - per notes from last admit, pts functional status remained extremely poor.  No capacity for decision making - seen by psych on last admit and deemed to NOT have capacity. P:   Sedation:  Propofol for sedation, PRN fentanyl for pain  RASS goal: 0 to -1  Daily WUA / SBT  Anticipate LP need 2/20   Family updated: No family at bedside am 2/20.  He has a long time partner Armed forces technical officer) and sister Aggie Cosier).    Interdisciplinary Family Meeting v Palliative Care Meeting:  Due by: 01/10/18.   Canary Brim, NP-C [facility] Pgr: [phone] or if no answer [phone] 01/06/2018, 9:55 AM

## 2018-01-06 NOTE — Anesthesia Procedure Notes (Signed)
Procedure Name: Intubation Date/Time: 01/06/2018 3:52 AM Performed by: Montel Clock, CRNA Pre-anesthesia Checklist: Patient identified, Emergency Drugs available, Suction available, Patient being monitored and Timeout performed Patient Re-evaluated:Patient Re-evaluated prior to induction Oxygen Delivery Method: Ambu bag Preoxygenation: Pre-oxygenation with 100% oxygen Laryngoscope Size: Mac and 3 Grade View: Grade I Tube type: Subglottic suction tube Tube size: 7.5 mm Number of attempts: 1 Airway Equipment and Method: Bougie stylet Placement Confirmation: ETT inserted through vocal cords under direct vision,  breath sounds checked- equal and bilateral and CO2 detector Secured at: 24 cm Tube secured with: Tape Dental Injury: Teeth and Oropharynx as per pre-operative assessment  Comments: ETT kinked and unable to fix with manipulation. ETT exchanged over bougie catheter and MAC 3 to lift tongue without difficulty. VSS.

## 2018-01-06 NOTE — Progress Notes (Signed)
[facility] PCCM Procedure Note  Indication: acute encephalopathy, HIV, history of cryptococcal meningitis  The patient could not provide consent, emergent consent   The L4-5 site was palpated then the skin was cleaned with chlorhexadine.  Sterile gloves, mask and cap was used. 1cc of 1% lidocaine was used to numb the skin.  The spinal needle was then inserted between the spinal processes and into the CSF.  Opening pressure was 19cm H20.  20cc of clear CSF was removed in to the specimen bottles and sent to the lab.  The needle was removed and the patient was placed in the supine position.  No complications  Heber CarolinaBrent McQuaid, MD [place] [facility] Pager: [phone] Cell: [phone] After 3pm or if no response, call [phone]

## 2018-01-06 NOTE — Progress Notes (Signed)
eLink Physician-Brief Progress Note Patient Name: Jacolyn Reedylonzo Dinse DOB: 1961-08-05 MRN: [id number]   Date of Service  01/06/2018  HPI/Events of Note  Called with lab result, CSF showed yeast. Pt noted to be on ampho B, ID following.   eICU Interventions  Continue current medications.         Shane CrutchPradeep Edan Juday 01/06/2018, 8:03 PM

## 2018-01-06 NOTE — Progress Notes (Signed)
Patient extubated per doctors order. Prior to extubation leak test was performed along with suction. No stridor was noted pre-extubation. Patient extubated to 2L/[place]. Patient able to nod when asked questions. No complications at this time. RT will continue to monitor.

## 2018-01-06 NOTE — Progress Notes (Signed)
RT unable to pass inline suction due to ET tube being kinked.  CRNA exchanged ET tube out for 7.5 and was secured at 24cm at the lips.  Pt tolerated well, no apparent complications.  +C02 color change and BBS equal.  RT to monitor and assess as needed.

## 2018-01-06 NOTE — Progress Notes (Signed)
Doctor performing lumbar puncture. Patient put back on full support. RT will continue to monitor.

## 2018-01-06 NOTE — Progress Notes (Signed)
CRITICAL VALUE ALERT  Critical Value:  Hgb 6.4, Ca 6.1  Date & Time Notied:  0600 01/06/18  Provider Notified: Dr Cyndra NumbersSmith, [facility]  Orders Received/Actions taken: See orders

## 2018-01-07 ENCOUNTER — Inpatient Hospital Stay (HOSPITAL_COMMUNITY): Payer: Medicaid Other

## 2018-01-07 DIAGNOSIS — N179 Acute kidney failure, unspecified: Secondary | ICD-10-CM

## 2018-01-07 LAB — BPAM RBC
Blood Product Expiration Date: 201903212359
ISSUE DATE / TIME: 201902201736
UNIT TYPE AND RH: 5100

## 2018-01-07 LAB — BASIC METABOLIC PANEL
ANION GAP: 14 (ref 5–15)
BUN: 88 mg/dL — AB (ref 6–20)
CHLORIDE: 113 mmol/L — AB (ref 101–111)
CO2: 25 mmol/L (ref 22–32)
Calcium: 7.9 mg/dL — ABNORMAL LOW (ref 8.9–10.3)
Creatinine, Ser: 3.39 mg/dL — ABNORMAL HIGH (ref 0.61–1.24)
GFR, EST AFRICAN AMERICAN: 21 mL/min — AB (ref >60)
GFR, EST NON AFRICAN AMERICAN: 19 mL/min — AB (ref >60)
Glucose, Bld: 92 mg/dL (ref 65–99)
POTASSIUM: 2.9 mmol/L — AB (ref 3.5–5.1)
SODIUM: 152 mmol/L — AB (ref 135–145)

## 2018-01-07 LAB — CBC
HCT: 28.8 % — ABNORMAL LOW (ref 39.0–52.0)
HEMOGLOBIN: 9.2 g/dL — AB (ref 13.0–17.0)
MCH: 26.9 pg (ref 26.0–34.0)
MCHC: 31.9 g/dL (ref 30.0–36.0)
MCV: 84.2 fL (ref 78.0–100.0)
PLATELETS: 125 10*3/uL — AB (ref 150–400)
RBC: 3.42 MIL/uL — AB (ref 4.22–5.81)
RDW: 15.1 % (ref 11.5–15.5)
WBC: 5.9 10*3/uL (ref 4.0–10.5)

## 2018-01-07 LAB — TYPE AND SCREEN
ABO/RH(D): O POS
Antibody Screen: NEGATIVE
Unit division: 0

## 2018-01-07 LAB — GLUCOSE, CAPILLARY
GLUCOSE-CAPILLARY: 84 mg/dL (ref 65–99)
GLUCOSE-CAPILLARY: 108 mg/dL — AB (ref 65–99)
Glucose-Capillary: 115 mg/dL — ABNORMAL HIGH (ref 65–99)
Glucose-Capillary: 137 mg/dL — ABNORMAL HIGH (ref 65–99)
Glucose-Capillary: 130 mg/dL — ABNORMAL HIGH (ref 65–99)

## 2018-01-07 LAB — HEPATITIS PANEL, ACUTE
HEP A IGM: NEGATIVE
HEP B C IGM: NEGATIVE
Hepatitis B Surface Ag: NEGATIVE

## 2018-01-07 LAB — CMV DNA, QUANTITATIVE, PCR
CMV DNA QUANT: NEGATIVE [IU]/mL
Log10 CMV Qn DNA Pl: UNDETERMINED log10 IU/mL

## 2018-01-07 LAB — MAGNESIUM: MAGNESIUM: 1.8 mg/dL (ref 1.7–2.4)

## 2018-01-07 MED ORDER — POTASSIUM CHLORIDE 10 MEQ/100ML IV SOLN
10.0000 meq | INTRAVENOUS | Status: AC
  Administered 2018-01-07 (×4): 10 meq via INTRAVENOUS
  Filled 2018-01-07: qty 100

## 2018-01-07 MED ORDER — POTASSIUM CL IN DEXTROSE 5% 20 MEQ/L IV SOLN
20.0000 meq | INTRAVENOUS | Status: DC
  Administered 2018-01-07 – 2018-01-08 (×2): 20 meq via INTRAVENOUS
  Filled 2018-01-07 (×4): qty 1000

## 2018-01-07 MED ORDER — METOPROLOL TARTRATE 5 MG/5ML IV SOLN
5.0000 mg | Freq: Four times a day (QID) | INTRAVENOUS | Status: DC
  Administered 2018-01-07 – 2018-01-08 (×6): 5 mg via INTRAVENOUS
  Filled 2018-01-07 (×5): qty 5

## 2018-01-07 NOTE — Progress Notes (Signed)
PULMONARY / CRITICAL CARE MEDICINE   Name: Kenneth Rivas MRN: [id number] DOB: 10/31/1961    ADMISSION DATE:  01/04/2018 CONSULTATION DATE:  01/04/18  REFERRING MD:  Juleen ChinaKohut - EDP  CHIEF COMPLAINT:  AMS  HISTORY OF PRESENT ILLNESS: Kenneth Rivas is a 57 y.o. 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  He completed flucytosine on 2/6 and was transitioned to oral fluconazole with plans for 8 weeks of therapy before dose adjustment by ID at outpatient follow up.  SNF was recommended on discharge but pt refused.  He went to live at a boarding house with a roommmate and apparently stopped taking his meds.  Home health nurses visited him on Friday 2/15 and recommended that he come to the ED for evaluation due to generalized weakness but he refused. On 2/18, case worker went to pickup pt for an appointment and found him with profound weakness and AMS.  [facility] evaluation and management.  In ED, he required intubation due to AMS and inability to protect his airway.  PCCM was subsequently called for admission to the ICU.  Per last ID notes 12/24/17, pt's functional status remained extremely poor.  Of note, he was also seen by psychiatry 12/24/17 for capacity evaluation.  He was deemed to NOT have capacity and it was recommended that he have an authorized representative help make decisions regarding his care.  At time of my exam, patient is intubated and sedated. He flutters eyelids to loud voice but is not obeying commands. RN reports patient's roommate dropped him off at the ER and left. I called the roommate Kenneth Rivas, who says that he is not a roommate but is the long-term partner of the  patient. He says they are not legally married though and that the patient has never signed POA paperwork. He reports patient's next of kin is his sister Kenneth Rivas, who was unable to be reached tonight. Kenneth Rivas reports that since patient's last hospital discharge, he has been weak and has been having urinary incontinence. He says patient had a nonproductive cough last week. He also reports no PO intake for the past 9 days and that patient hasnt taken his meds for 2 days. He denies that patient has been having any N/V/D, Fever, or Abdominal pain. He says patient has been very cold/chilled lately and has been needing more blankets than usual. Patient was on the way to a doctor's appointment and passed out in the back of the car, so Kenneth Rivas said they detoured to the ER instead.    SUBJECTIVE:  Pt on 1L, sitting up in bed.  K replaced per Elink.  Extubated / tolerated well.   VITAL SIGNS: BP (!) 155/61   Pulse (!) 105   Temp (!) 97.4 F (36.3 C) (Axillary)   Resp 18   Ht 5\' 6"  (1.676 m)   Wt 107 lb 12.9 oz (48.9 kg)   SpO2 96%   BMI 17.40 kg/m   VENTILATOR SETTINGS: Vent Mode: CPAP;PSV FiO2 (%):  [30 %] 30 % PEEP:  [5 cmH20] 5 cmH20 Pressure Support:  [5 cmH20] 5 cmH20  INTAKE / OUTPUT: I/O last 3 completed shifts: In: 6798.8 [I.V.:4998.8; Blood:30; Other:30; NG/GT:940; IV Piggyback:800] Out: 6670 [Urine:6670]  PHYSICAL EXAMINATION: General: cachectic male sitting up in bed   HEENT: MM pink/moist PSY: flat affect Neuro: Awakens to voice, nods/ interacts, soft voice  CV: s1s2 rrr, no m/r/g PULM: even/non-labored, lungs bilaterally clear ZO:XWRU, non-tender, bsx4 active  Extremities: warm/dry, no edema  Skin: no rashes or lesions  LABS:  BMET Recent Labs  Lab 01/05/18 0450 01/06/18 0500 01/07/18 0248  NA 148* 146* 152*  K 5.2* 4.2 2.9*  CL 116* 107 113*  CO2 17* 26 25  BUN 132* 107* 88*  CREATININE 5.14* 4.47* 3.39*  GLUCOSE 100* 118* 92    Electrolytes Recent Labs  Lab  01/05/18 0450 01/05/18 1031 01/05/18 1725 01/06/18 0500 01/07/18 0248  CALCIUM 7.8*  --   --  6.1* 7.9*  MG 2.3 2.2 2.2 1.8 1.8  PHOS 7.2* 6.2* 5.3* 4.7*  --     CBC Recent Labs  Lab 01/05/18 0450 01/06/18 0500 01/07/18 0248  WBC 7.9 5.2 5.9  HGB 8.3* 6.4* 9.2*  HCT 24.7* 18.5* 28.8*  PLT 168 139* 125*    Coag's Recent Labs  Lab 01/04/18 1637  INR 1.31    Sepsis Markers Recent Labs  Lab 01/04/18 1854 01/04/18 2016 01/05/18 0450 01/06/18 0500  LATICACIDVEN 3.82* 3.2* 1.9  --   PROCALCITON  --  3.68 5.43 6.19    ABG Recent Labs  Lab 01/04/18 1835 01/04/18 2245 01/05/18 0454  PHART 7.285* 7.289* 7.457*  PCO2ART 21.4* 23.8* 23.6*  PO2ART 221* 183* 133*    Liver Enzymes Recent Labs  Lab 01/04/18 1637 01/06/18 0500  AST 35 759*  ALT 19 502*  ALKPHOS 129* 83  BILITOT 0.7 0.7  ALBUMIN 3.0* 1.8*    Cardiac Enzymes Recent Labs  Lab 01/04/18 1637  TROPONINI 0.05*   Glucose Recent Labs  Lab 01/06/18 1320 01/06/18 1606 01/06/18 1938 01/06/18 2333 01/07/18 0317 01/07/18 0745  GLUCAP 114* 101* 79 72 84 115*    Imaging Dg Chest Port 1 View  Result Date: 01/07/2018 CLINICAL DATA:  Respiratory failure. EXAM: PORTABLE CHEST 1 VIEW COMPARISON:  01/06/2018. FINDINGS: Interim extubation and removal of NG tube. Mediastinum and hilar structures are normal. Heart size stable. No focal infiltrate. No acute pulmonary disease identified. No pleural effusion or pneumothorax. Heart size normal. IMPRESSION: 1.  Interim extubation removal of NG tube. 2.  No acute pulmonary disease identified. Electronically Signed   By: Maisie Fus  Register   On: 01/07/2018 06:51   STUDIES:  Renal US 2/18 >> mild heterogeneity & renal parenchymal echogenicity bilaterally (possible glomerular nephritis or pyelonephritis), no stones or hydronephrosis Head CT 2/18 >> chronic changes, no acute findings  EEG 2/19 >> normal EEG  CULTURES: Blood 2/18 >>  Urine 2/18 >> multiple  species Sputum 2/19 >> RVP 2/18 >> negative  AFB Blood 2/19 >>  UC 2/20 >>  CMV PCR 2/20 >>  Cryptococcal Antigen 2/20 >> greater 2,560  CSF Culture 2/20 >> yeast >>  ANTIBIOTICS: Vanc 2/18 >> 2/19 Zosyn 2/18 >> Diflucan (crypto) 2/18 >> 2/20 (increased LFT's) Bactrim (prophylaxis) >>  Amphotericin B 2/20 >> Azithro QW 2/19 >>   SIGNIFICANT EVENTS: 2/18 Admitted with acute encephalopathy requiring intubation for failure to protect airway, hypothermic and in septic shock on presentation to ER. BP improved with IVF's. 2/20  Propofol at 18 mcg.  Weaning on 5/5. ETT kinked overnight / replaced.  Extubated   LINES/TUBES: ETT 2/18 >> 2/20 exchanged, 2/20 >> 2/20 Foley catheter 2/18 >> 2/20 OG tube 2/18 >> 2/20 L Fem  TLC 2/18 >> 2/20   DISCUSSION: 57 y.o. male with hx AIDS (CD4 < 10) and medication non-compliance, recent cryptococcal meningitis & medical non-compliance.  Just discharged 12/24/17 with plans for 8 weeks fluconazole before dose adjustment by ID.  Admitted 2/18 with AMS and weakness.  Required intubation in ED.  ASSESSMENT / PLAN:  PULMONARY A: Acute Respiratory insufficiency - s/p intubation in ED in setting of encephalopathy  P:   Pulmonary hygiene- IS, mobilize  Follow intermittent CXR   CARDIOVASCULAR A:  Troponin bump - suspect demand ischemia Hx HTN, NICM, VT, QTc Prolongation  P:  Change to SDU status  Add IV lopressor 5mg  Q6 in place of home coreg until can take PO's   RENAL A:   Hypernatremia - dehydration from decreased PO intake.  FWD ~ 1.5L. Hyperkalemia - s/p 1g Ca gluconate, 10u insulin, 1amp D50 in ED. Hyperchloremia. AGMA - lactate + uremia. AKI - ? Pre-renal from decreased PO / hypovolemia vs ATN.  Oliguric, improved 2/20 Hypermagnesemia. P:   Trend BMP / urinary output Replace electrolytes as indicated Avoid nephrotoxic agents, ensure adequate renal perfusion KVO IVF  Await repeat urine culture D5 with 20 mEq KCL at 100 ml/hr    GASTROINTESTINAL A:   Protein calorie malnutrition. GI prophylaxis. Nutrition. P:   NPO  SLP consult for swallowing evaluation  Stop date added for Pepcid    HEMATOLOGIC A:   Anemia - chronic. VTE Prophylaxis. P:  Trend CBC  Transfuse for Hgb <7% Hold sq heparin with hgb drop 2/20 (required 1 unit PRBC) SCD's   INFECTIOUS A:   Hx recent cryptococcal meningitis (was prescribed 800mg  fluconazole daily x 8 weeks), AIDS (last CD4 count < 10 on 11/27/17), HBV. Septic shock due to UTI P:   Appreciate ID input  Await cultures  Defer therapy to ID for cryptococcal meningitis   ENDOCRINE A:   Hyperglycemia.  P:   SSI  NEUROLOGIC A:   Acute encephalopathy - multifactorial, likely exacerbated by worsening cryptococcal meningitis in setting of medication non-compliance. Failure to thrive - per notes from last admit, pts functional status remained extremely poor.  No capacity for decision making - seen by psych on last admit and deemed to NOT have capacity. Deconditioning  P:   Supportive care PT consult  OOB   Family updated: No family at bedside 2/21 am.  Patient updated on plan of care.  He has a long time partner Armed forces technical officer) and sister Kenneth Cosier).    Interdisciplinary Family Meeting v Palliative Care Meeting:  Due by: 01/10/18.   Canary Brim, NP-C [facility] Pgr: [phone] or if no answer [phone] 01/07/2018, 8:08 AM

## 2018-01-07 NOTE — Progress Notes (Signed)
Pharmacy Antibiotic Note  Kenneth Rivas is a 57 y.o. male admitted on 01/04/2018 with altered mental status and weakness.  PMH significant for AIDS, cryptococcal meningitis.  Recent hospitalization where pt received Amphotericin B and flucytosine and was discharged on fluconazole.  In ED patient received Zosyn 3.375gm IV and Vancomycin 1000mg  IV x 1 dose each.  Patient intubated in ED.  Upon admission, Pharmacy has been consulted for Vancomycin and Zosyn dosing for sepsis.   Scr = 6.3 on admit (Scr = 0.76 on 12/23/17)  Today, 01/07/2018 Day #4 Zosyn Tmax 99.4 WBC wnl SCr elevated but improved 3.39, CrCl ~16 ml/min  Plan:  Zosyn 2.25gm IV q8h for CrCl > 20 ml/min  F/U AM SCr  F/u duration of therapy pending final cultures  Height: 5\' 6"  (167.6 cm) Weight: 107 lb 12.9 oz (48.9 kg) IBW/kg (Calculated) : 63.8  Temp (24hrs), Avg:97 F (36.1 C), Min:95.9 F (35.5 C), Max:99.1 F (37.3 C)  Recent Labs  Lab 01/04/18 1637 01/04/18 1646 01/04/18 1854 01/04/18 2016 01/05/18 0450 01/06/18 0500 01/07/18 0248  WBC 10.3  --   --   --  7.9 5.2 5.9  CREATININE 6.30*  --   --  5.33* 5.14* 4.47* 3.39*  LATICACIDVEN  --  9.16* 3.82* 3.2* 1.9  --   --     Estimated Creatinine Clearance: 16.4 mL/min (A) (by C-G formula based on SCr of 3.39 mg/dL (H)).    No Known Allergies  Antimicrobials this admission:  2/18 vanc >>  2/19 2/18 zosyn >>  2/20 2/18 fluconazole >> 2/20 2/20 Ambisome/flucytosine >>   Dose adjustments this admission:    Microbiology results:  2/18 BCx: NGTD 2/18 UCx: multiple species  2/18 Sputum: ordered  2/18 MRSA PCR: negative 2/18 Resp Panel: negative 2/19 AFb smear:  2/20 CSF: yeast present 2/20 UCx:   Thank you for allowing pharmacy to be a part of this patient's care.  Loralee PacasErin Raydel Hosick, PharmD, BCPS Pager: [phone] 01/07/2018 9:24 AM

## 2018-01-07 NOTE — Evaluation (Signed)
Clinical/Bedside Swallow Evaluation Patient Details  Name: Kenneth Rivas MRN: [id number] Date of Birth: 1961-10-19  Today's Date: 01/07/2018 Time: SLP Start Time (ACUTE ONLY): 1059 SLP Stop Time (ACUTE ONLY): 1119 SLP Time Calculation (min) (ACUTE ONLY): 20 min  Past Medical History:  Past Medical History:  Diagnosis Date  . Acute renal insufficiency 01/05/2018  . AIDS (HCC)   . Anemia   . Anxiety   . Cellulitis of right hand 06/23/2017  . Hepatitis B   . HIV disease (HCC)   . Hypertension   . Immune deficiency disorder [facility](HCC)    Past Surgical History:  Past Surgical History:  Procedure Laterality Date  . 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 Intubation 2/18-2/20 patient is intubated and sedated. CXR No acute pulmonary disease identified. MBS 06/28/17 acute, continue NPO and repeat MBS 8/17 with improvements, pharyngeal phase normal except for premature spill, no penetration or aspiration and D2, thin recommended.    Assessment / Plan / Recommendation Clinical Impression  Pt required moderate cues to follow directions due to possible hearing deficit (right ear better). Decreased vocal intensity and weak cough. He exhibited mild prolonged oral mastication and transit with complete clearance. Delayed throat clear x 1 during assessment across textures and frequent eructation indicative of possible esophageal involvement. Aspiration risk mild-mod given current deconditioned status. SLP recommends D2, thin, pills with water, full supervision and assist and continued ST intervention SLP Visit Diagnosis: Dysphagia, unspecified (R13.10)    Aspiration Risk  Mild aspiration risk;Moderate aspiration risk    Diet Recommendation Dysphagia 2 (Fine chop);Thin liquid   Liquid Administration via: Cup;Straw Medication Administration:  Whole meds with liquid Supervision: Patient able to self feed;Staff to assist with self feeding;Full supervision/cueing for compensatory strategies Compensations: Minimize environmental distractions;Slow rate;Small sips/bites Postural Changes: Seated upright at 90 degrees    Other  Recommendations Oral Care Recommendations: Oral care BID   Follow up Recommendations None      Frequency and Duration min 2x/week  2 weeks       Prognosis Prognosis for Safe Diet Advancement: (fair-good) Barriers to Reach Goals: Cognitive deficits      Swallow Study   General HPI: Kenneth Silversmithlonzo Dudleyis a 57 y.o.malewith PMH AIDS wiith hx non-compliance of HAART regimen, 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 Intubation 2/18-2/20 patient is intubated and sedated. CXR No acute pulmonary disease identified. MBS 06/28/17 acute, continue NPO and repeat MBS 8/17 with improvements, pharyngeal phase normal except for premature spill, no penetration or aspiration and D2, thin recommended.  Type of Study: Bedside Swallow Evaluation Previous Swallow Assessment: (see HPI) Diet Prior to this Study: NPO Temperature Spikes Noted: Yes(slight) Respiratory Status: Nasal cannula History of Recent Intubation: Yes Length of Intubations (days): 3 days Date extubated: 01/06/18 Behavior/Cognition: Alert;Cooperative;Requires cueing Oral Cavity Assessment: Dry Oral Care Completed by SLP: Yes Oral Cavity - Dentition: Adequate natural dentition(missing upper right) Vision: Functional for self-feeding Self-Feeding Abilities: Able to feed self;Needs assist;Needs set up Patient Positioning: Upright in bed Baseline Vocal Quality: Low vocal intensity Volitional Cough: Weak Volitional Swallow: Able to elicit    Oral/Motor/Sensory Function Overall Oral Motor/Sensory Function: Within functional limits   Ice Chips Ice chips: Not tested   Thin Liquid Thin Liquid:  Impaired Presentation: Cup;Straw Pharyngeal  Phase Impairments: Throat Clearing - Delayed  Nectar Thick Nectar Thick Liquid: Not tested   Honey Thick Honey Thick Liquid: Not tested   Puree Puree: Within functional limits   Solid   GO   Solid: Impaired Oral Phase Impairments: Reduced lingual movement/coordination Oral Phase Functional Implications: Prolonged oral transit        Kenneth Rivas 01/07/2018,11:34 AM  Kenneth Rivas.Ed [employer] [phone]

## 2018-01-07 NOTE — Progress Notes (Addendum)
PHARMACY BRIEF NOTE:   ELECTROLYTE REPLACEMENT PER LIPSOMAL AMPHOTERICIN B PROTOCOL  Ambisome 200 mg (4 mg/kg per ID) IV q24h in combination with flucytosine 1250 mg (25 mg/kg/dose) PO q6h  for cryptococcal meningitis. ID service was consulted and is actively following.  Recent Labs    01/04/18 1637 01/04/18 2016  01/05/18 1725 01/06/18 0500 01/07/18 0248  NA 147* 148*   < >  --  146* 152*  K 6.1* 5.1   < >  --  4.2 2.9*  CL 116* 121*   < >  --  107 113*  CO2 8* 12*   < >  --  26 25  GLUCOSE 225* 351*   < >  --  118* 92  BUN 148* 122*   < >  --  107* 88*  CREATININE 6.30* 5.33*   < >  --  4.47* 3.39*  CALCIUM 9.7 8.3*   < >  --  6.1* 7.9*  PHOS  --  9.6*   < > 5.3* 4.7*  --   MG 3.5* 2.7*   < > 2.2 1.8 1.8  ALBUMIN 3.0*  --   --   --  1.8*  --   ALKPHOS 129*  --   --   --  83  --   AST 35  --   --   --  759*  --   ALT 19  --   --   --  502*  --   BILITOT 0.7  --   --   --  0.7  --   TRIG  --  214*  --   --   --   --    < > = values in this interval not displayed.    Assessment: - Potassium = 2.9 - Magnesium 1.8  - SCr 3.39, admitted with AKI, oliguric   Plan, per Amphotericin B Protocol: - Potassium chloride 10meq IV x 4 runs per [facility] Ambisome Protocol (reduced from usual 6 runs due to elevated SCr) - No magnesium replacement today due to elevated SCr  - Continue NS 500 mL bolus IV before and after each infusion of Ambisome  - Continue daily BMET, Mag - Monitor SCr, UOP  Loralee PacasErin Mieczyslaw Stamas, PharmD, BCPS Pager: [phone] 01/07/2018 7:19 AM

## 2018-01-07 NOTE — Progress Notes (Signed)
Patient ID: Kenneth Rivas, male   DOB: Mar 28, 1961, 57 y.o.   MRN: [id number]          [facility]  Date of Admission:  01/04/2018           Day 3 piperacillin tazobactam        Day 2 amphotericin and flucytosine ASSESSMENT: His CSF showed 10 white blood cells with 86% lymphocytes and a moderately elevated total protein.  His CSF cryptococcal antigen was extremely elevated at greater than 2560.  Yeast were seen on stain.  Cultures are pending.  His creatinine is trending down.  I will continue amphotericin and flucytosine pending final CSF culture results.  I see no need to continue empiric piperacillin tazobactam now  PLAN: 1. Continue amphotericin and 5 flucytosine 2. Await results of CSF cultures 3. Repeat liver enzymes in a.m. 4. Discontinue piperacillin tazobactam  Active Problems:   Septic shock (HCC)   Acute renal insufficiency   AIDS (acquired immune deficiency syndrome) (HCC)   Cryptococcal meningoencephalitis (HCC)   Cachexia associated with AIDS (HCC)   Protein-calorie malnutrition, severe   Acute respiratory failure with hypoxia (HCC)   Scheduled Meds: . azithromycin  1,200 mg Per Tube Weekly  . chlorhexidine  15 mL Mouth Rinse BID  . dextrose  10 mL Intravenous Q24H  . dextrose  10 mL Intravenous Q24H  . feeding supplement (NEPRO CARB STEADY)  1,000 mL Oral Q24H  . flucytosine  25 mg/kg Oral Q6H  . insulin aspart  1-3 Units Subcutaneous Q4H  . mouth rinse  15 mL Mouth Rinse q12n4p  . metoprolol tartrate  5 mg Intravenous Q6H  . sulfamethoxazole-trimethoprim  1 tablet Oral Once per day on Mon Wed Fri   Continuous Infusions: . sodium chloride    . sodium chloride Stopped (01/06/18 1823)  . sodium chloride Stopped (01/06/18 2300)  . amphotericin  B  Liposome (AMBISOME) ADULT IV Stopped (01/06/18 2203)  . dextrose 5 % with KCl 20 mEq / L 20 mEq (01/07/18 1000)  . famotidine (PEPCID) IV Stopped (01/07/18 0731)  .  piperacillin-tazobactam (ZOSYN)  IV Stopped (01/07/18 1030)   PRN Meds:.sodium chloride, acetaminophen, acetaminophen, diphenhydrAMINE **OR** diphenhydrAMINE, hydrALAZINE, meperidine (DEMEROL) injection, ondansetron (ZOFRAN) IV  Review of Systems: Review of Systems  Unable to perform ROS: Mental acuity    No Known Allergies  OBJECTIVE: Vitals:   01/07/18 1100 01/07/18 1200 01/07/18 1300 01/07/18 1400  BP: 125/74 (!) 149/71 139/75 (!) 159/65  Pulse: 86 84 74 86  Resp: (!) 24 20  (!) 5  Temp:  98.9 F (37.2 C)    TempSrc:  Oral    SpO2: 99% 98% 99% 98%  Weight:      Height:       Body mass index is 17.4 kg/m.  Physical Exam  Constitutional:  He was extubated yesterday.  He remains extremely weak.  He does open his eyes when I call his name.  All he can tell me is that he wants some water.  Cardiovascular: Normal rate and regular rhythm.  No murmur heard. Pulmonary/Chest: Effort normal. He has no wheezes. He has no rales.  Abdominal: Soft.  Skin: No rash noted.    Lab Results Lab Results  Component Value Date   WBC 5.9 01/07/2018   HGB 9.2 (L) 01/07/2018   HCT 28.8 (L) 01/07/2018   MCV 84.2 01/07/2018   PLT 125 (L) 01/07/2018    Lab Results  Component Value Date   CREATININE 3.39 (  H) 01/07/2018   BUN 88 (H) 01/07/2018   NA 152 (H) 01/07/2018   K 2.9 (L) 01/07/2018   CL 113 (H) 01/07/2018   CO2 25 01/07/2018    Lab Results  Component Value Date   ALT 502 (H) 01/06/2018   AST 759 (H) 01/06/2018   ALKPHOS 83 01/06/2018   BILITOT 0.7 01/06/2018     Microbiology: Recent Results (from the past 240 hour(s))  Culture, blood (Routine x 2)     Status: None (Preliminary result)   Collection Time: 01/04/18  3:53 PM  Result Value Ref Range Status   Specimen Description   Final    BLOOD RIGHT ARM Performed at [facility], [address] [place], [place] [address]    Special Requests   Final    IN PEDIATRIC BOTTLE Blood Culture adequate  volume Performed at [facility], [address] [place], [place] [address]    Culture   Final    NO GROWTH 3 DAYS Performed at [facility], [address] [place], [place] [address]    Report Status PENDING  Incomplete  Respiratory Panel by PCR     Status: None   Collection Time: 01/04/18  8:20 PM  Result Value Ref Range Status   Adenovirus NOT DETECTED NOT DETECTED Final   Coronavirus 229E NOT DETECTED NOT DETECTED Final   Coronavirus HKU1 NOT DETECTED NOT DETECTED Final   Coronavirus NL63 NOT DETECTED NOT DETECTED Final   Coronavirus OC43 NOT DETECTED NOT DETECTED Final   Metapneumovirus NOT DETECTED NOT DETECTED Final   Rhinovirus / Enterovirus NOT DETECTED NOT DETECTED Final   Influenza A NOT DETECTED NOT DETECTED Final   Influenza B NOT DETECTED NOT DETECTED Final   Parainfluenza Virus 1 NOT DETECTED NOT DETECTED Final   Parainfluenza Virus 2 NOT DETECTED NOT DETECTED Final   Parainfluenza Virus 3 NOT DETECTED NOT DETECTED Final   Parainfluenza Virus 4 NOT DETECTED NOT DETECTED Final   Respiratory Syncytial Virus NOT DETECTED NOT DETECTED Final   Bordetella pertussis NOT DETECTED NOT DETECTED Final   Chlamydophila pneumoniae NOT DETECTED NOT DETECTED Final   Mycoplasma pneumoniae NOT DETECTED NOT DETECTED Final  Culture, Urine     Status: Abnormal   Collection Time: 01/04/18  8:20 PM  Result Value Ref Range Status   Specimen Description   Final    URINE, CATHETERIZED Performed at [facility], [address] [place], [place] [address]    Special Requests   Final    NONE Performed at [facility], [address] [place], [place] [address]    Culture MULTIPLE SPECIES PRESENT, SUGGEST RECOLLECTION (A)  Final   Report Status 01/06/2018 FINAL  Final  MRSA PCR Screening     Status: None   Collection Time: 01/04/18  8:21 PM  Result Value Ref Range Status   MRSA by PCR NEGATIVE NEGATIVE Final    Comment:         The GeneXpert MRSA Assay (FDA approved for NASAL specimens only), is one component of a comprehensive MRSA colonization surveillance program. It is not intended to diagnose MRSA infection nor to guide or monitor treatment for MRSA infections. Performed at [facility], [address] [place], [place] [address]   Culture, blood (Routine x 2)     Status: None (Preliminary result)   Collection Time: 01/05/18  3:25 AM  Result Value Ref Range Status   Specimen Description   Final  BLOOD LEFT ANTECUBITAL Performed at [facility], [address] [place], [place] [address]    Special Requests   Final    BOTTLES DRAWN AEROBIC AND ANAEROBIC Blood Culture adequate volume Performed at [facility], [address], [place], [place] [address]    Culture   Final    NO GROWTH 2 DAYS Performed at [facility], [address] [place], [place] [address]    Report Status PENDING  Incomplete  CSF culture     Status: None (Preliminary result)   Collection Time: 01/06/18  1:29 PM  Result Value Ref Range Status   Specimen Description   Final    CSF Performed at [facility], [address] [place], [place] [address]    Special Requests   Final    NONE Performed at [facility], [address], [place], [place] [address]    Gram Stain   Final    WBC PRESENT, PREDOMINANTLY MONONUCLEAR YEAST PRESENT CYTOSPIN SMEAR CRITICAL RESULT CALLED TO, READ BACK BY AND VERIFIED WITH: Alison Stalling. RN @1930  ON 02.20.19 BY COHEN,K Performed at [facility], [address], [place], [place] [address]    Culture   Final    NO GROWTH < 24 HOURS Performed at [facility], [address] [place], [place] [address]    Report Status PENDING  Incomplete    [name], MD [facility] for Infectious Disease [facility] [phone] pager   [phone]  cell 01/07/2018, 4:43 PM

## 2018-01-08 LAB — COMPREHENSIVE METABOLIC PANEL
ALK PHOS: 114 U/L (ref 38–126)
ALT: 432 U/L — AB (ref 17–63)
ANION GAP: 9 (ref 5–15)
AST: 290 U/L — ABNORMAL HIGH (ref 15–41)
Albumin: 2.1 g/dL — ABNORMAL LOW (ref 3.5–5.0)
BILIRUBIN TOTAL: 0.7 mg/dL (ref 0.3–1.2)
BUN: 45 mg/dL — ABNORMAL HIGH (ref 6–20)
CALCIUM: 7.7 mg/dL — AB (ref 8.9–10.3)
CO2: 20 mmol/L — ABNORMAL LOW (ref 22–32)
CREATININE: 2.22 mg/dL — AB (ref 0.61–1.24)
Chloride: 107 mmol/L (ref 101–111)
GFR, EST AFRICAN AMERICAN: 36 mL/min — AB (ref >60)
GFR, EST NON AFRICAN AMERICAN: 31 mL/min — AB (ref >60)
Glucose, Bld: 146 mg/dL — ABNORMAL HIGH (ref 65–99)
Potassium: 4.5 mmol/L (ref 3.5–5.1)
SODIUM: 136 mmol/L (ref 135–145)
TOTAL PROTEIN: 6.8 g/dL (ref 6.5–8.1)

## 2018-01-08 LAB — CBC
HCT: 32.9 % — ABNORMAL LOW (ref 39.0–52.0)
HEMOGLOBIN: 10.1 g/dL — AB (ref 13.0–17.0)
MCH: 26.1 pg (ref 26.0–34.0)
MCHC: 30.7 g/dL (ref 30.0–36.0)
MCV: 85 fL (ref 78.0–100.0)
Platelets: 63 10*3/uL — ABNORMAL LOW (ref 150–400)
RBC: 3.87 MIL/uL — AB (ref 4.22–5.81)
RDW: 15.6 % — ABNORMAL HIGH (ref 11.5–15.5)
WBC: 3.9 10*3/uL — ABNORMAL LOW (ref 4.0–10.5)

## 2018-01-08 LAB — CSF CELL COUNT WITH DIFFERENTIAL
Lymphs, CSF: 86 % — ABNORMAL HIGH (ref 40–80)
Monocyte-Macrophage-Spinal Fluid: 8 % — ABNORMAL LOW (ref 15–45)
RBC Count, CSF: 17 /mm3 — ABNORMAL HIGH
SEGMENTED NEUTROPHILS-CSF: 6 % (ref 0–6)
TUBE #: 4
WBC, CSF: 10 /mm3 — ABNORMAL HIGH (ref 0–5)

## 2018-01-08 LAB — GLUCOSE, CAPILLARY
GLUCOSE-CAPILLARY: 100 mg/dL — AB (ref 65–99)
GLUCOSE-CAPILLARY: 142 mg/dL — AB (ref 65–99)
GLUCOSE-CAPILLARY: 115 mg/dL — AB (ref 65–99)
GLUCOSE-CAPILLARY: 86 mg/dL (ref 65–99)
GLUCOSE-CAPILLARY: 76 mg/dL (ref 65–99)
Glucose-Capillary: 118 mg/dL — ABNORMAL HIGH (ref 65–99)
Glucose-Capillary: 108 mg/dL — ABNORMAL HIGH (ref 65–99)
Glucose-Capillary: 155 mg/dL — ABNORMAL HIGH (ref 65–99)

## 2018-01-08 LAB — BASIC METABOLIC PANEL
ANION GAP: 10 (ref 5–15)
BUN: 50 mg/dL — AB (ref 6–20)
CO2: 22 mmol/L (ref 22–32)
Calcium: 7.7 mg/dL — ABNORMAL LOW (ref 8.9–10.3)
Chloride: 107 mmol/L (ref 101–111)
Creatinine, Ser: 2.3 mg/dL — ABNORMAL HIGH (ref 0.61–1.24)
GFR calc Af Amer: 34 mL/min — ABNORMAL LOW (ref >60)
GFR, EST NON AFRICAN AMERICAN: 30 mL/min — AB (ref >60)
GLUCOSE: 111 mg/dL — AB (ref 65–99)
POTASSIUM: 3.2 mmol/L — AB (ref 3.5–5.1)
Sodium: 139 mmol/L (ref 135–145)

## 2018-01-08 LAB — URINE CULTURE: Culture: NO GROWTH

## 2018-01-08 LAB — MAGNESIUM: Magnesium: 1.3 mg/dL — ABNORMAL LOW (ref 1.7–2.4)

## 2018-01-08 LAB — CULTURE, FUNGUS WITHOUT SMEAR

## 2018-01-08 MED ORDER — HYDRALAZINE HCL 20 MG/ML IJ SOLN
10.0000 mg | INTRAMUSCULAR | Status: DC | PRN
  Administered 2018-01-08 – 2018-01-09 (×2): 10 mg via INTRAVENOUS
  Filled 2018-01-08 (×2): qty 1

## 2018-01-08 MED ORDER — MEGESTROL ACETATE 40 MG PO TABS
160.0000 mg | ORAL_TABLET | Freq: Two times a day (BID) | ORAL | Status: DC
  Administered 2018-01-08 – 2018-01-09 (×2): 160 mg via ORAL
  Filled 2018-01-08 (×2): qty 4

## 2018-01-08 MED ORDER — ENSURE ENLIVE PO LIQD: 237.0000 mL | ORAL | Status: DC

## 2018-01-08 MED ORDER — METOPROLOL TARTRATE 25 MG PO TABS
25.0000 mg | ORAL_TABLET | Freq: Two times a day (BID) | ORAL | Status: DC
  Administered 2018-01-08 – 2018-01-15 (×15): 25 mg via ORAL
  Filled 2018-01-08 (×14): qty 1

## 2018-01-08 MED ORDER — PANTOPRAZOLE SODIUM 40 MG PO TBEC
40.0000 mg | DELAYED_RELEASE_TABLET | Freq: Every day | ORAL | Status: DC
  Administered 2018-01-09 – 2018-01-15 (×7): 40 mg via ORAL
  Filled 2018-01-08 (×7): qty 1

## 2018-01-08 MED ORDER — AZITHROMYCIN 600 MG PO TABS
1200.0000 mg | ORAL_TABLET | ORAL | Status: DC
  Administered 2018-01-08 – 2018-01-15 (×2): 1200 mg via ORAL
  Filled 2018-01-08 (×2): qty 2

## 2018-01-08 MED ORDER — METOPROLOL TARTRATE 12.5 MG HALF TABLET: 12.5000 mg | ORAL_TABLET | Freq: Two times a day (BID) | ORAL | Status: DC

## 2018-01-08 MED ORDER — ADULT MULTIVITAMIN W/MINERALS CH
1.0000 | ORAL_TABLET | Freq: Every day | ORAL | Status: DC
  Administered 2018-01-09 – 2018-01-15 (×7): 1 via ORAL
  Filled 2018-01-08 (×7): qty 1

## 2018-01-08 MED ORDER — POTASSIUM CHLORIDE CRYS ER 20 MEQ PO TBCR: 40.0000 meq | EXTENDED_RELEASE_TABLET | Freq: Once | ORAL | Status: DC

## 2018-01-08 MED ORDER — ENSURE ENLIVE PO LIQD
237.0000 mL | Freq: Three times a day (TID) | ORAL | Status: DC
  Administered 2018-01-08 – 2018-01-15 (×16): 237 mL via ORAL

## 2018-01-08 MED ORDER — MAGNESIUM SULFATE 2 GM/50ML IV SOLN
2.0000 g | Freq: Once | INTRAVENOUS | Status: AC
  Administered 2018-01-08: 2 g via INTRAVENOUS
  Filled 2018-01-08: qty 50

## 2018-01-08 MED ORDER — POTASSIUM CHLORIDE 10 MEQ/100ML IV SOLN
10.0000 meq | INTRAVENOUS | Status: DC
  Administered 2018-01-08 (×2): 10 meq via INTRAVENOUS
  Filled 2018-01-08 (×2): qty 100

## 2018-01-08 MED ORDER — POTASSIUM CL IN DEXTROSE 5% 20 MEQ/L IV SOLN
20.0000 meq | INTRAVENOUS | Status: DC
  Administered 2018-01-08 – 2018-01-11 (×5): 20 meq via INTRAVENOUS
  Filled 2018-01-08 (×5): qty 1000

## 2018-01-08 MED ORDER — POTASSIUM CHLORIDE 20 MEQ/15ML (10%) PO SOLN
40.0000 meq | Freq: Once | ORAL | Status: AC
  Administered 2018-01-08: 40 meq via ORAL
  Filled 2018-01-08: qty 30

## 2018-01-08 NOTE — Progress Notes (Addendum)
PHARMACY BRIEF NOTE:   ELECTROLYTE REPLACEMENT PER LIPSOMAL AMPHOTERICIN B PROTOCOL  Ambisome 200 mg (4 mg/kg per ID) IV q24h in combination with flucytosine 1250 mg (25 mg/kg/dose) PO q6h  for cryptococcal meningitis. ID service was consulted and is actively following.  Recent Labs    01/05/18 1725  01/06/18 0500 01/07/18 0248 01/08/18 0238  NA  --    < > 146* 152* 139  K  --    < > 4.2 2.9* 3.2*  CL  --    < > 107 113* 107  CO2  --    < > 26 25 22   GLUCOSE  --    < > 118* 92 111*  BUN  --    < > 107* 88* 50*  CREATININE  --    < > 4.47* 3.39* 2.30*  CALCIUM  --    < > 6.1* 7.9* 7.7*  PHOS 5.3*  --  4.7*  --   --   MG 2.2  --  1.8 1.8 1.3*  ALBUMIN  --   --  1.8*  --   --   ALKPHOS  --   --  83  --   --   AST  --   --  759*  --   --   ALT  --   --  502*  --   --   BILITOT  --   --  0.7  --   --    < > = values in this interval not displayed.    Assessment: - Potassium  3.2 with AM labs - Magnesium 1.3 - SCr 2.50, admitted with AKI, oliguric. Scr has improved since admission and it has been    Plan, per Amphotericin B Protocol: - Potassium chloride 10meq IV x 4 runs per [place] Ambisome Protocol  - Magnesium 2 gr IV x1  - Continue NS 500 mL bolus IV before and after each infusion of Ambisome  - Continue daily BMET, Mag - Monitor SCr, UOP   Kenneth Rivas, PharmD, BCPS Pager [phone] 01/08/2018 7:39 AM   ADDENDUM   Repeat BMP drawn at 0736 where K+ is 4.5,   - One run of KCl has been given, will discontinue the other three.  Kenneth Rivas, PharmD, BCPS Pager [phone] 01/08/2018 11:19 AM

## 2018-01-08 NOTE — Evaluation (Signed)
Physical Therapy Evaluation Patient Details Name: Kenneth Rivas MRN: [id number] DOB: 22-Feb-1961 Today's Date: 01/08/2018   History of Present Illness  Kenneth Rivas is a 57 y.o. 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  SNF was recommended on discharge but pt refused.  He went to live at a boarding house with a roommmate and apparently stopped taking his meds.  Home health nurses visited him on Friday 2/15 and recommended that he come to the ED for evaluation due to generalized weakness but he refused.  On 2/18, case worker went to pickup pt for an appointment and found him with profound weakness and AMS.  He was subsequently brought to [facility] ED for further evaluation and management.  Patient intubated 2/18-2/20.  Clinical Impression  Patient presents with decreased mobility due to generalized weakness, decreased activity tolerance, decreased balance, decreased safety awareness and high fall risk (even with assist).  Feel he will need SNF level rehab at d/c.  PT to follow acutely.    Follow Up Recommendations SNF;Supervision/Assistance - 24 hour    Equipment Recommendations  Other (comment)(TBA)    Recommendations for Other Services       Precautions / Restrictions Precautions Precautions: Fall      Mobility  Bed Mobility Overal bed mobility: Needs Assistance Bed Mobility: Rolling;Sidelying to Sit Rolling: Max assist Sidelying to sit: Max assist       General bed mobility comments: assist to roll completely for hygiene, pt able to initiate partial roll with S, side to sit assist for legs off bed and to lift trunk  Transfers Overall transfer level: Needs assistance Equipment used: Rolling walker (2 wheeled) Transfers: Sit to/from Stand Sit to Stand: Max  assist;+2 physical assistance Stand pivot transfers: Max assist;+2 physical assistance       General transfer comment: lifting help of 2 to stand with RW, demonstrated flexed posture with COG behind BOS despite cues and assist for hip extension,  patient able to step with RW and +2 A to chair, but difficulty backing up so brought chair up in time for pt to sit prematurely   Ambulation/Gait             General Gait Details: unable  Stairs            Wheelchair Mobility    Modified Rankin (Stroke Patients Only)       Balance Overall balance assessment: Needs assistance Sitting-balance support: Feet supported Sitting balance-Leahy Scale: Poor Sitting balance - Comments: assist due to leaning posterior Postural control: Posterior lean Standing balance support: Bilateral upper extremity supported Standing balance-Leahy Scale: Zero Standing balance comment: +2 A with walker due to leaning back and flexed posture with COG behind BOS                             Pertinent Vitals/Pain Pain Assessment: Faces Faces Pain Scale: Hurts even more Pain Location: sore around rectum during cleaning Pain Descriptors / Indicators: Discomfort;Sore;Tender Pain Intervention(s): Repositioned;Monitored during session;Limited activity within patient's tolerance    Home Living Family/patient expects to be discharged to:: Skilled nursing facility                      Prior Function  Comments: Patient poor historian, reports independent with everything, but per chart was having episodes of incontinence and was extremely weak at home.     Hand Dominance        Extremity/Trunk Assessment   Upper Extremity Assessment Upper Extremity Assessment: Generalized weakness    Lower Extremity Assessment Lower Extremity Assessment: Generalized weakness    Cervical / Trunk Assessment Cervical / Trunk Assessment: Kyphotic  Communication   Communication:  HOH  Cognition Arousal/Alertness: Awake/alert Behavior During Therapy: WFL for tasks assessed/performed Overall Cognitive Status: No family/caregiver present to determine baseline cognitive functioning Area of Impairment: Orientation;Following commands                 Orientation Level: Disoriented to;Place;Time;Situation     Following Commands: Follows one step commands inconsistently;Follows one step commands with increased time     Problem Solving: Slow processing;Decreased initiation;Requires verbal cues;Requires tactile cues        General Comments      Exercises     Assessment/Plan    PT Assessment Patient needs continued PT services  PT Problem List Decreased strength;Decreased mobility;Decreased safety awareness;Decreased activity tolerance;Decreased balance;Decreased knowledge of use of DME;Decreased cognition;Decreased knowledge of precautions       PT Treatment Interventions DME instruction;Functional mobility training;Balance training;Patient/family education;Gait training;Therapeutic activities;Therapeutic exercise;Neuromuscular re-education    PT Goals (Current goals can be found in the Care Plan section)  Acute Rehab PT Goals Patient Stated Goal: None stated PT Goal Formulation: Patient unable to participate in goal setting Time For Goal Achievement: 01/22/18 Potential to Achieve Goals: Fair    Frequency Min 3X/week   Barriers to discharge        Co-evaluation               AM-PAC PT "6 Clicks" Daily Activity  Outcome Measure Difficulty turning over in bed (including adjusting bedclothes, sheets and blankets)?: Unable Difficulty moving from lying on back to sitting on the side of the bed? : Unable Difficulty sitting down on and standing up from a chair with arms (e.g., wheelchair, bedside commode, etc,.)?: Unable Help needed moving to and from a bed to chair (including a wheelchair)?: A Lot Help needed walking in hospital room?:  Total Help needed climbing 3-5 steps with a railing? : Total 6 Click Score: 7    End of Session Equipment Utilized During Treatment: Gait belt Activity Tolerance: Patient limited by fatigue Patient left: with call bell/phone within reach;with chair alarm set;in chair Nurse Communication: Mobility status PT Visit Diagnosis: Other abnormalities of gait and mobility (R26.89);Muscle weakness (generalized) (M62.81);Other symptoms and signs involving the nervous system (R29.898)    Time: [id number] PT Time Calculation (min) (ACUTE ONLY): 35 min   Charges:   PT Evaluation $PT Eval High Complexity: 1 High PT Treatments $Therapeutic Activity: 8-22 mins   PT G CodesSheran Lawless:        Cyndi Wynn, [place] [phone] 01/08/2018   Elray Mcgregorynthia Wynn 01/08/2018, 1:03 PM

## 2018-01-08 NOTE — Progress Notes (Signed)
Patient Demographics:    Kenneth Rivas, is a 57 y.o. male, DOB - 1960/12/22, ZOX:[id number]  Admit date - 01/04/2018   Admitting Physician Gigi Gin, MD  Outpatient Primary MD for the patient is Daiva Eves, Lisette Grinder, MD  LOS - 4   Chief Complaint  Patient presents with  . Weakness        Subjective:    Kenneth Rivas today has no fevers, no emesis,  No chest pain, no new concerns  Assessment  & Plan :    Active Problems:   Cachexia associated with AIDS (HCC)   Protein-calorie malnutrition, severe   AIDS (acquired immune deficiency syndrome) (HCC)   Cryptococcal meningoencephalitis (HCC)   Acute respiratory failure with hypoxia (HCC)   Septic shock (HCC)   Acute renal insufficiency   AKI (acute kidney injury) (HCC)  Brief Summary: 57 y.o. male with PMH as outlined below including AIDS with CD4 count < 10 on 11/27/17 with hx non-compliance of HAART regimen, cryptococcal meningitis in Fall of 2018, Hep B, tobacco and alcohol as well as  cocaine abuse who was admitted on 218/19 with encephalopathy, found to have possible severe persistent cryptococcal meningoencephalitis (recently treated for same), was intubated on 01/04/2018, Extubated 01/06/18, transferred to hospitalist service on 01/08/2018  LP/CSF Studies- CSF showed 10 white blood cells with 86% lymphocytes and a moderately elevated total protein.  His CSF cryptococcal antigen was extremely elevated at greater than 2560.  Yeast were seen on stain.  Cultures are pending.    Plan:- 1)Severe Persistent Cryptococcal MeningoEncephalitis ----persistent symptoms despite having undergone 2 rounds of induction chemotherapy over the past 6 months.  Repeat spinal fluid shows yeast on stain which may reflect active infection or nonviable yeast following recent treatment.  I infectious disease recommends continue amphotericin and 5 flucytosine pending  final CSF culture results.    2)FEN/Severe protein caloric malnutrition/cachexia-in the setting of underlying HIV infection and prolonged cryptococcal meningoencephalitis-dietitian consult appreciated, continue nutritional supplements, recheck and replace electrolytes, c/n D5 sol iv. start Megace  3)Hiv/AIDS-  Infectious disease  recommends holding off on starting antiretroviral therapy until he has gone through repeat induction and at least 8 weeks of consolidation therapy to avoid immune reconstitution inflammatory syndrome.  Continue Bactrim DS 1 tablet Mondays Wednesdays and Fridays for PCP prophylaxis, continue azithromycin 1200 mg weekly for prophylaxis,.   4)Disposition-continue PT, patient will need skilled nursing facility rehab prior to returning home  5)HTN-stable, metoprolol 25 mg twice daily, may use IV Hydralazine 10 mg  Every 4 hours Prn for systolic blood pressure over 160 mmhg   Code Status : Full    Disposition Plan  : SNF  Consults  :  ID/PCCM   DVT Prophylaxis  :  SCDs (low platelets  Lab Results  Component Value Date   PLT 63 (L) 01/08/2018    Inpatient Medications  Scheduled Meds: . azithromycin  1,200 mg Per Tube Weekly  . chlorhexidine  15 mL Mouth Rinse BID  . dextrose  10 mL Intravenous Q24H  . dextrose  10 mL Intravenous Q24H  . feeding supplement (ENSURE ENLIVE)  237 mL Oral Q24H  . flucytosine  25 mg/kg Oral Q6H  . insulin aspart  1-3 Units Subcutaneous Q4H  . mouth  rinse  15 mL Mouth Rinse q12n4p  . metoprolol tartrate  5 mg Intravenous Q6H  . multivitamin with minerals  1 tablet Oral Daily  . sulfamethoxazole-trimethoprim  1 tablet Oral Once per day on Mon Wed Fri   Continuous Infusions: . sodium chloride    . sodium chloride Stopped (01/07/18 1700)  . sodium chloride 500 mL (01/07/18 1930)  . amphotericin  B  Liposome (AMBISOME) ADULT IV Stopped (01/07/18 1900)  . dextrose 5 % with KCl 20 mEq / L 20 mEq (01/08/18 0547)  . famotidine  (PEPCID) IV Stopped (01/08/18 0735)   PRN Meds:.sodium chloride, acetaminophen, acetaminophen, diphenhydrAMINE **OR** diphenhydrAMINE, hydrALAZINE, meperidine (DEMEROL) injection, ondansetron (ZOFRAN) IV    Anti-infectives (From admission, onward)   Start     Dose/Rate Route Frequency Ordered Stop   01/06/18 1800  flucytosine (ANCOBON) capsule 1,250 mg  Status:  Discontinued     25 mg/kg  48.9 kg Oral Every 6 hours 01/06/18 1430 01/06/18 1454   01/06/18 1800  flucytosine (ANCOBON) capsule 1,250 mg     25 mg/kg  48.9 kg Oral Every 6 hours 01/06/18 1454     01/06/18 1700  amphotericin B liposome (AMBISOME) 200 mg in dextrose 5 % 500 mL IVPB     4 mg/kg  48.9 kg 250 mL/hr over 120 Minutes Intravenous Every 24 hours 01/06/18 1430     01/06/18 0900  sulfamethoxazole-trimethoprim (BACTRIM DS,SEPTRA DS) 800-160 MG per tablet 1 tablet     1 tablet Oral Once per day on Mon Wed Fri 01/05/18 1438     01/05/18 1800  azithromycin (ZITHROMAX) 200 MG/5ML suspension 1,200 mg     1,200 mg Per Tube Weekly 01/05/18 1717     01/05/18 1600  azithromycin (ZITHROMAX) tablet 1,200 mg  Status:  Discontinued     1,200 mg Oral Weekly 01/05/18 1438 01/05/18 1717   01/05/18 1200  fluconazole (DIFLUCAN) 40 MG/ML suspension 400 mg  Status:  Discontinued     400 mg Per Tube Daily 01/05/18 1057 01/06/18 1012   01/05/18 0200  piperacillin-tazobactam (ZOSYN) IVPB 2.25 g  Status:  Discontinued     2.25 g 100 mL/hr over 30 Minutes Intravenous Every 8 hours 01/04/18 2044 01/07/18 1650   01/04/18 2015  piperacillin-tazobactam (ZOSYN) IVPB 3.375 g  Status:  Discontinued     3.375 g 100 mL/hr over 30 Minutes Intravenous  Once 01/04/18 2000 01/04/18 2005   01/04/18 2015  vancomycin (VANCOCIN) IVPB 1000 mg/200 mL premix  Status:  Discontinued     1,000 mg 200 mL/hr over 60 Minutes Intravenous  Once 01/04/18 2000 01/04/18 2005   01/04/18 1800  fluconazole (DIFLUCAN) 40 MG/ML suspension 400 mg     400 mg Per Tube  Once  01/04/18 1750 01/04/18 1949   01/04/18 1615  vancomycin (VANCOCIN) IVPB 1000 mg/200 mL premix     1,000 mg 200 mL/hr over 60 Minutes Intravenous  Once 01/04/18 1603 01/04/18 1914   01/04/18 1615  piperacillin-tazobactam (ZOSYN) IVPB 3.375 g     3.375 g 100 mL/hr over 30 Minutes Intravenous  Once 01/04/18 1603 01/04/18 1813        Objective:   Vitals:   01/08/18 0800 01/08/18 1100 01/08/18 1200 01/08/18 1300  BP: (!) 158/91 (!) 175/152 (!) 173/105 (!) 155/105  Pulse: 68 76 72 70  Resp: (!) 21 (!) 23 15 18   Temp: 98 F (36.7 C)  98.2 F (36.8 C)   TempSrc: Oral  Oral   SpO2: 100% 100% 99%  99%  Weight:      Height:        Wt Readings from Last 3 Encounters:  01/08/18 48.9 kg (107 lb 12.9 oz)  12/24/17 50.7 kg (111 lb 11.2 oz)  09/09/17 57.6 kg (127 lb)     Intake/Output Summary (Last 24 hours) at 01/08/2018 1408 Last data filed at 01/08/2018 0500 Gross per 24 hour  Intake 2750 ml  Output 2950 ml  Net -200 ml     Physical Exam  Gen:- Awake Alert, cachectic/emaciated HEENT:- [place].AT, No sclera icterus Neck-Supple Neck,No JVD,.  Lungs-diminished in bases, no wheezing  CV- S1, S2 normal Abd-  +ve B.Sounds, Abd Soft, No tenderness,    Extremity/Skin:- No  edema,    Psych-affect is appropriate Neuro-no new focal deficits   Data Review:   Micro Results Recent Results (from the past 240 hour(s))  Culture, blood (Routine x 2)     Status: None (Preliminary result)   Collection Time: 01/04/18  3:53 PM  Result Value Ref Range Status   Specimen Description   Final    BLOOD RIGHT ARM Performed at [facility], [address] [place], [place] [address]    Special Requests   Final    IN PEDIATRIC BOTTLE Blood Culture adequate volume Performed at [facility], [address], [place], [place] [address]    Culture   Final    NO GROWTH 3 DAYS Performed at [facility], [address] [place], [place] [address]    Report  Status PENDING  Incomplete  Respiratory Panel by PCR     Status: None   Collection Time: 01/04/18  8:20 PM  Result Value Ref Range Status   Adenovirus NOT DETECTED NOT DETECTED Final   Coronavirus 229E NOT DETECTED NOT DETECTED Final   Coronavirus HKU1 NOT DETECTED NOT DETECTED Final   Coronavirus NL63 NOT DETECTED NOT DETECTED Final   Coronavirus OC43 NOT DETECTED NOT DETECTED Final   Metapneumovirus NOT DETECTED NOT DETECTED Final   Rhinovirus / Enterovirus NOT DETECTED NOT DETECTED Final   Influenza A NOT DETECTED NOT DETECTED Final   Influenza B NOT DETECTED NOT DETECTED Final   Parainfluenza Virus 1 NOT DETECTED NOT DETECTED Final   Parainfluenza Virus 2 NOT DETECTED NOT DETECTED Final   Parainfluenza Virus 3 NOT DETECTED NOT DETECTED Final   Parainfluenza Virus 4 NOT DETECTED NOT DETECTED Final   Respiratory Syncytial Virus NOT DETECTED NOT DETECTED Final   Bordetella pertussis NOT DETECTED NOT DETECTED Final   Chlamydophila pneumoniae NOT DETECTED NOT DETECTED Final   Mycoplasma pneumoniae NOT DETECTED NOT DETECTED Final  Culture, Urine     Status: Abnormal   Collection Time: 01/04/18  8:20 PM  Result Value Ref Range Status   Specimen Description   Final    URINE, CATHETERIZED Performed at [facility], [address], [place], [place] [address]    Special Requests   Final    NONE Performed at [facility], [address] [place], [place] [address]    Culture MULTIPLE SPECIES PRESENT, SUGGEST RECOLLECTION (A)  Final   Report Status 01/06/2018 FINAL  Final  MRSA PCR Screening     Status: None   Collection Time: 01/04/18  8:21 PM  Result Value Ref Range Status   MRSA by PCR NEGATIVE NEGATIVE Final    Comment:        The GeneXpert MRSA Assay (FDA approved for NASAL specimens only), is one component  of a comprehensive MRSA colonization surveillance program. It is not intended to diagnose MRSA infection nor to guide or monitor  treatment for MRSA infections. Performed at [facility], [address], [place], [place] [address]   Culture, blood (Routine x 2)     Status: None (Preliminary result)   Collection Time: 01/05/18  3:25 AM  Result Value Ref Range Status   Specimen Description   Final    BLOOD LEFT ANTECUBITAL Performed at [facility], [address], [place], [place] [address]    Special Requests   Final    BOTTLES DRAWN AEROBIC AND ANAEROBIC Blood Culture adequate volume Performed at [facility], [address] [place], [place] [address]    Culture   Final    NO GROWTH 2 DAYS Performed at [facility], [address] [place], [place] [address]    Report Status PENDING  Incomplete  CSF culture     Status: None (Preliminary result)   Collection Time: 01/06/18  1:29 PM  Result Value Ref Range Status   Specimen Description   Final    CSF Performed at [facility], [address] [place], [place] [address]    Special Requests   Final    NONE Performed at [facility], [address], [place], [place] [address]    Gram Stain   Final    WBC PRESENT, PREDOMINANTLY MONONUCLEAR YEAST PRESENT CYTOSPIN SMEAR CRITICAL RESULT CALLED TO, READ BACK BY AND VERIFIED WITH: Alison Stalling. RN @1930  ON 02.20.19 BY COHEN,K Performed at [facility], [address], [place], [place] [address]    Culture   Final    NO GROWTH 1 DAY Performed at [facility], [address], [place], [place] [address]    Report Status PENDING  Incomplete  Urine Culture     Status: None   Collection Time: 01/06/18  3:29 PM  Result Value Ref Range Status   Specimen Description   Final    URINE, CATHETERIZED Performed at [facility], [address] [place], [place] [address]    Special Requests   Final    NONE Performed at [facility], [address]  [place], [place] [address]    Culture   Final    NO GROWTH Performed at [facility], [address], [place], [place] [address]    Report Status 01/08/2018 FINAL  Final    Radiology Reports Dg Abd 1 View  Result Date: 01/06/2018 CLINICAL DATA:  OG tube placement. EXAM: ABDOMEN - 1 VIEW COMPARISON:  Radiograph 01/04/2018 FINDINGS: Tip of the enteric tube below the diaphragm in the stomach, side-port in the region of the a gastroesophageal junction, advancement of least 3 cm would lead to optimal placement. Right femoral catheter tip is in the left pelvis directed inferiorly likely in the left iliac vessels. No bowel dilatation to suggest obstruction. Phleboliths in the pelvis. IMPRESSION: 1. Side port of the enteric tube in the region of the gastroesophageal junction, recommend advancement of at least 3 cm. 2. Right femoral catheter in place, tip to the left of midline directed inferiorly in the region of the left common iliac vessels. Recommend repositioning. Electronically Signed   By: Rubye Oaks M.D.   On: 01/06/2018 04:26   Ct Head Wo Contrast  Result Date: 01/04/2018 CLINICAL DATA:  Altered mental status. HIV positive with history of cryptococcal meningitis. EXAM: CT HEAD  WITHOUT CONTRAST TECHNIQUE: Contiguous axial images were obtained from the base of the skull through the vertex without intravenous contrast. COMPARISON:  Head CT 11/26/2017 and MRI brain 06/25/2017 FINDINGS: Brain: Age advanced cerebral atrophy, ventriculomegaly and periventricular white matter disease. No acute intracranial findings or mass lesions. No extra-axial fluid collections. Vascular: No hyperdense vessels or obvious aneurysm. Skull: No skull fracture or bone lesion. Sinuses/Orbits: The paranasal sinuses and mastoid air cells are clear. The globes are intact. Other: No scalp lesions or hematoma. IMPRESSION: Chronic brain changes as described above.  No acute findings. Electronically Signed   By: Rudie Meyer  M.D.   On: 01/04/2018 20:54   US Renal  Result Date: 01/05/2018 CLINICAL DATA:  Acute kidney injury EXAM: RENAL / URINARY TRACT ULTRASOUND COMPLETE COMPARISON:  Overlapping portions from CT chest dated 06/17/2017 FINDINGS: Right Kidney: Length: 10.9 cm. The upper pole the right kidney seems slightly hypoechoic with respect to the lower pole, although some of this may be secondary to poor characterization of the upper pole. No mass or hydronephrosis visualized. No stones are noted. Left Kidney: Length: 9.6 cm. Heterogeneity in parenchymal echogenicity with the lower pole seemingly slightly hypoechoic compared to the upper pole, significance uncertain. No mass or hydronephrosis visualized. Bladder: The bladder is empty and appears otherwise normal for degree of bladder distention. A Foley catheter is in place. IMPRESSION: 1. Mild heterogeneity and renal parenchymal echogenicity bilaterally as noted above. Possibilities might include glomerular nephritis or pyelonephritis. No definite perirenal fluid collection. Correlate with urine analysis in determining whether further workup is warranted. 2. No stones or hydronephrosis identified. Electronically Signed   By: Gaylyn Rong M.D.   On: 01/05/2018 10:50   Dg Chest Port 1 View  Result Date: 01/07/2018 CLINICAL DATA:  Respiratory failure. EXAM: PORTABLE CHEST 1 VIEW COMPARISON:  01/06/2018. FINDINGS: Interim extubation and removal of NG tube. Mediastinum and hilar structures are normal. Heart size stable. No focal infiltrate. No acute pulmonary disease identified. No pleural effusion or pneumothorax. Heart size normal. IMPRESSION: 1.  Interim extubation removal of NG tube. 2.  No acute pulmonary disease identified. Electronically Signed   By: Maisie Fus  Register   On: 01/07/2018 06:51   Dg Chest Port 1 View  Result Date: 01/06/2018 CLINICAL DATA:  Re-intubation. Acute respiratory failure with hypoxia. EXAM: PORTABLE CHEST 1 VIEW COMPARISON:  Radiograph  yesterday at 0432 hour FINDINGS: Endotracheal tube 4.7 cm from the carina. Enteric tube in place with side-port in the region of the gastroesophageal junction. Mild hyperinflation. Mild infrahilar atelectasis. No focal airspace opacity. No pleural fluid or pneumothorax. IMPRESSION: 1. Endotracheal tube tip at the thoracic inlet. Enteric tube in place. 2. Mild infrahilar atelectasis. Electronically Signed   By: Rubye Oaks M.D.   On: 01/06/2018 04:30   Dg Chest Port 1 View  Result Date: 01/05/2018 CLINICAL DATA:  Respiratory failure EXAM: PORTABLE CHEST 1 VIEW COMPARISON:  01/04/2018 FINDINGS: Endotracheal tube is unchanged. NG tube is been placed into the stomach. The side port is near the GE junction. Lungs are clear. Heart is normal size. No effusions or acute bony abnormality. IMPRESSION: No active disease. Electronically Signed   By: Charlett Nose M.D.   On: 01/05/2018 07:26   Dg Chest Portable 1 View  Result Date: 01/04/2018 CLINICAL DATA:  Encounter for endotracheal tube position. EXAM: PORTABLE CHEST 1 VIEW COMPARISON:  11/26/2017 FINDINGS: The heart size and mediastinal contours are within normal limits. Tip of endotracheal tube is 5.4 cm above the carina. Both lungs  are hyperinflated but clear. No pneumothorax or effusion. Costophrenic angles are excluded on this study bilaterally. The visualized skeletal structures are unremarkable. IMPRESSION: Satisfactory endotracheal tube position with the tip 5.4 cm above the carina. Hyperinflated lungs. Electronically Signed   By: Tollie Eth M.D.   On: 01/04/2018 18:13   Dg Abd Portable 1v  Result Date: 01/04/2018 CLINICAL DATA:  NG tube placement. EXAM: PORTABLE ABDOMEN - 1 VIEW COMPARISON:  None. FINDINGS: The NG tube tip is in the fundal region of the stomach. The proximal port is in the distal esophagus. The tube could be advanced several cm. IMPRESSION: NG tube tip is in the fundal region the stomach with the proximal port in the distal  esophagus. Electronically Signed   By: Rudie Meyer M.D.   On: 01/04/2018 19:41   Dg Fluoro Guide Lumbar Puncture  Result Date: 12/17/2017 CLINICAL DATA:  Cryptococcal meningoencephalitis EXAM: DIAGNOSTIC LUMBAR PUNCTURE UNDER FLUOROSCOPIC GUIDANCE FLUOROSCOPY TIME:  Fluoroscopy Time:  0 minutes, 36 seconds Radiation Exposure Index (if provided by the fluoroscopic device): 14.3 mGy Number of Acquired Spot Images: 0 PROCEDURE: I discussed the risks (including hemorrhage, infection, headache, and nerve damage, among others), benefits, and alternatives to fluoroscopically guided lumbar puncture by telephone with the patient's sister, who is guiding medical decision-making on behalf of the patient due to his altered mental status. We specifically discussed the high technical likelihood of success of the procedure. She understood and elected for the patient to undergo the procedure. Standard time-out was employed. Following sterile skin prep and local anesthetic administration consisting of 1 percent lidocaine, a 22 gauge spinal needle was advanced without difficulty into the thecal sac at the at the L2-3 level. Clear CSF was returned. Opening pressure was 17 cm of water. 10 cc of clear CSF was collected. The CSF return was very sluggish. The needle was subsequently removed and the skin cleansed and bandaged. No immediate complications were observed. IMPRESSION: 1. Technically successful fluoroscopically guided lumbar puncture at the L2-3 level. Opening pressure was 17 cm of water. Electronically Signed   By: Gaylyn Rong M.D.   On: 12/17/2017 16:27   Dg Fluoro Guide Lumbar Puncture  Result Date: 12/11/2017 CLINICAL DATA:  Cryptococcal meningitis EXAM: DIAGNOSTIC LUMBAR PUNCTURE UNDER FLUOROSCOPIC GUIDANCE FLUOROSCOPY TIME:  Fluoroscopy Time:  45 sec Radiation Exposure Index (if provided by the fluoroscopic device): 11.41 mGy Number of Acquired Spot Images: 0 PROCEDURE: Informed consent was obtained from  the patient prior to the procedure, including potential complications of headache, allergy, and pain. With the patient prone, the lower back was prepped with Betadine. 1% Lidocaine was used for local anesthesia. Lumbar puncture was performed at the L3-4 level using a 20 gauge needle with return of clear CSF with an opening pressure of 38 cm water. Eight ml of CSF were obtained for laboratory studies. The patient tolerated the procedure well and there were no apparent complications. IMPRESSION: Successful fluoroscopic guided lumbar puncture as above. Electronically Signed   By: Charlett Nose M.D.   On: 12/11/2017 15:05     CBC Recent Labs  Lab 01/04/18 1637 01/05/18 0450 01/06/18 0500 01/07/18 0248 01/08/18 0238  WBC 10.3 7.9 5.2 5.9 3.9*  HGB 12.6* 8.3* 6.4* 9.2* 10.1*  HCT 38.3* 24.7* 18.5* 28.8* 32.9*  PLT 321 168 139* 125* 63*  MCV 84.5 80.7 80.8 84.2 85.0  MCH 27.8 27.1 27.9 26.9 26.1  MCHC 32.9 33.6 34.6 31.9 30.7  RDW 15.5 15.3 14.9 15.1 15.6*  LYMPHSABS 0.9  --   --   --   --  MONOABS 0.9  --   --   --   --   EOSABS 0.0  --   --   --   --   BASOSABS 0.0  --   --   --   --     Chemistries  Recent Labs  Lab 01/04/18 1637  01/05/18 0450 01/05/18 1031 01/05/18 1725 01/06/18 0500 01/07/18 0248 01/08/18 0238 01/08/18 0736  NA 147*   < > 148*  --   --  146* 152* 139 136  K 6.1*   < > 5.2*  --   --  4.2 2.9* 3.2* 4.5  CL 116*   < > 116*  --   --  107 113* 107 107  CO2 8*   < > 17*  --   --  26 25 22  20*  GLUCOSE 225*   < > 100*  --   --  118* 92 111* 146*  BUN 148*   < > 132*  --   --  107* 88* 50* 45*  CREATININE 6.30*   < > 5.14*  --   --  4.47* 3.39* 2.30* 2.22*  CALCIUM 9.7   < > 7.8*  --   --  6.1* 7.9* 7.7* 7.7*  MG 3.5*   < > 2.3 2.2 2.2 1.8 1.8 1.3*  --   AST 35  --   --   --   --  759*  --   --  290*  ALT 19  --   --   --   --  502*  --   --  432*  ALKPHOS 129*  --   --   --   --  83  --   --  114  BILITOT 0.7  --   --   --   --  0.7  --   --  0.7   < > =  values in this interval not displayed.   ------------------------------------------------------------------------------------------------------------------ No results for input(s): CHOL, HDL, LDLCALC, TRIG, CHOLHDL, LDLDIRECT in the last 72 hours.  No results found for: HGBA1C ------------------------------------------------------------------------------------------------------------------ No results for input(s): TSH, T4TOTAL, T3FREE, THYROIDAB in the last 72 hours.  Invalid input(s): FREET3 ------------------------------------------------------------------------------------------------------------------ No results for input(s): VITAMINB12, FOLATE, FERRITIN, TIBC, IRON, RETICCTPCT in the last 72 hours.  Coagulation profile Recent Labs  Lab 01/04/18 1637  INR 1.31    No results for input(s): DDIMER in the last 72 hours.  Cardiac Enzymes Recent Labs  Lab 01/04/18 1637  TROPONINI 0.05*   ------------------------------------------------------------------------------------------------------------------ No results found for: BNP   Shon Haleourage Vaniya Augspurger M.D on 01/08/2018 at 2:08 PM  Between 7am to 7pm - Pager - [phone]  After 7pm go to [email or web address] - password [email or web address]  [facility] -  Office  [phone]   Voice Recognition Reubin Milan/Dragon dictation system was used to create this note, attempts have been made to correct errors. Please contact the author with questions and/or clarifications.

## 2018-01-08 NOTE — Progress Notes (Signed)
Patient ID: Kenneth Rivas, male   DOB: [birth year], 59 y.o.   MRN: [id number]         [facility]  Date of Admission:  01/04/2018            Day 3 amphotericin and flucytosine ASSESSMENT: He has severe persistent cryptococcal meningoencephalitis despite having undergone 2 rounds of induction chemotherapy over the past 6 months.  Repeat spinal fluid shows yeast on stain which may reflect active infection or nonviable yeast following recent treatment.  I plan on continue amphotericin and 5 flucytosine pending final culture results.  He will need placement in a skilled nursing facility for directly supervised therapy.  Plan on holding off on starting antiretroviral therapy until he has gone through repeat induction and at least 8 weeks of consolidation therapy to avoid immune reconstitution inflammatory syndrome.  PLAN: 1. Continue amphotericin and 5 flucytosine 2. Await results of CSF cultures 3. Please call Dr. Judyann Munson [phone]) for any infectious disease questions this weekend  Active Problems:   Septic shock (HCC)   Acute renal insufficiency   AIDS (acquired immune deficiency syndrome) (HCC)   Cryptococcal meningoencephalitis (HCC)   Cachexia associated with AIDS (HCC)   Protein-calorie malnutrition, severe   Acute respiratory failure with hypoxia (HCC)   AKI (acute kidney injury) (HCC)   Scheduled Meds: . azithromycin  1,200 mg Per Tube Weekly  . chlorhexidine  15 mL Mouth Rinse BID  . dextrose  10 mL Intravenous Q24H  . dextrose  10 mL Intravenous Q24H  . feeding supplement (NEPRO CARB STEADY)  1,000 mL Oral Q24H  . flucytosine  25 mg/kg Oral Q6H  . insulin aspart  1-3 Units Subcutaneous Q4H  . mouth rinse  15 mL Mouth Rinse q12n4p  . metoprolol tartrate  5 mg Intravenous Q6H  . sulfamethoxazole-trimethoprim  1 tablet Oral Once per day on Mon Wed Fri   Continuous Infusions: . sodium chloride    . sodium chloride Stopped (01/07/18  1700)  . sodium chloride 500 mL (01/07/18 1930)  . amphotericin  B  Liposome (AMBISOME) ADULT IV Stopped (01/07/18 1900)  . dextrose 5 % with KCl 20 mEq / L 20 mEq (01/08/18 0547)  . famotidine (PEPCID) IV Stopped (01/08/18 0735)  . magnesium sulfate 1 - 4 g bolus IVPB    . potassium chloride Stopped (01/08/18 1045)   PRN Meds:.sodium chloride, acetaminophen, acetaminophen, diphenhydrAMINE **OR** diphenhydrAMINE, hydrALAZINE, meperidine (DEMEROL) injection, ondansetron (ZOFRAN) IV  Review of Systems: Review of Systems  Unable to perform ROS: Mental acuity    No Known Allergies  OBJECTIVE: Vitals:   01/08/18 0500 01/08/18 0514 01/08/18 0700 01/08/18 0800  BP:  (!) 160/80 (!) 159/82 (!) 158/91  Pulse: 70 72 68 68  Resp: 14 20 (!) 21 (!) 21  Temp:    98 F (36.7 C)  TempSrc:    Oral  SpO2: 99% 97% 98% 100%  Weight:  107 lb 12.9 oz (48.9 kg)    Height:       Body mass index is 17.4 kg/m.  Physical Exam  Constitutional:  He remains extremely weak.  He does open his eyes when I call his name.  I believe he is hard of hearing.  Cardiovascular: Normal rate and regular rhythm.  No murmur heard. Pulmonary/Chest: Effort normal. He has no wheezes. He has no rales.  Abdominal: Soft.  Skin: No rash noted.    Lab Results Lab Results  Component Value Date   WBC 3.9 (  L) 01/08/2018   HGB 10.1 (L) 01/08/2018   HCT 32.9 (L) 01/08/2018   MCV 85.0 01/08/2018   PLT 63 (L) 01/08/2018    Lab Results  Component Value Date   CREATININE 2.22 (H) 01/08/2018   BUN 45 (H) 01/08/2018   NA 136 01/08/2018   K 4.5 01/08/2018   CL 107 01/08/2018   CO2 20 (L) 01/08/2018    Lab Results  Component Value Date   ALT 432 (H) 01/08/2018   AST 290 (H) 01/08/2018   ALKPHOS 114 01/08/2018   BILITOT 0.7 01/08/2018     Microbiology: Recent Results (from the past 240 hour(s))  Culture, blood (Routine x 2)     Status: None (Preliminary result)   Collection Time: 01/04/18  3:53 PM  Result Value  Ref Range Status   Specimen Description   Final    BLOOD RIGHT ARM Performed at [facility], [address] [place], [place] [address]    Special Requests   Final    IN PEDIATRIC BOTTLE Blood Culture adequate volume Performed at [facility], [address] [place], [place] [address]    Culture   Final    NO GROWTH 3 DAYS Performed at [facility], [address] [place], [place] [address]    Report Status PENDING  Incomplete  Respiratory Panel by PCR     Status: None   Collection Time: 01/04/18  8:20 PM  Result Value Ref Range Status   Adenovirus NOT DETECTED NOT DETECTED Final   Coronavirus 229E NOT DETECTED NOT DETECTED Final   Coronavirus HKU1 NOT DETECTED NOT DETECTED Final   Coronavirus NL63 NOT DETECTED NOT DETECTED Final   Coronavirus OC43 NOT DETECTED NOT DETECTED Final   Metapneumovirus NOT DETECTED NOT DETECTED Final   Rhinovirus / Enterovirus NOT DETECTED NOT DETECTED Final   Influenza A NOT DETECTED NOT DETECTED Final   Influenza B NOT DETECTED NOT DETECTED Final   Parainfluenza Virus 1 NOT DETECTED NOT DETECTED Final   Parainfluenza Virus 2 NOT DETECTED NOT DETECTED Final   Parainfluenza Virus 3 NOT DETECTED NOT DETECTED Final   Parainfluenza Virus 4 NOT DETECTED NOT DETECTED Final   Respiratory Syncytial Virus NOT DETECTED NOT DETECTED Final   Bordetella pertussis NOT DETECTED NOT DETECTED Final   Chlamydophila pneumoniae NOT DETECTED NOT DETECTED Final   Mycoplasma pneumoniae NOT DETECTED NOT DETECTED Final  Culture, Urine     Status: Abnormal   Collection Time: 01/04/18  8:20 PM  Result Value Ref Range Status   Specimen Description   Final    URINE, CATHETERIZED Performed at [facility], [address] [place], [place] [address]    Special Requests   Final    NONE Performed at [facility], [address] [place], [place] [address]    Culture MULTIPLE SPECIES PRESENT,  SUGGEST RECOLLECTION (A)  Final   Report Status 01/06/2018 FINAL  Final  MRSA PCR Screening     Status: None   Collection Time: 01/04/18  8:21 PM  Result Value Ref Range Status   MRSA by PCR NEGATIVE NEGATIVE Final    Comment:        The GeneXpert MRSA Assay (FDA approved for NASAL specimens only), is one component of a comprehensive MRSA colonization surveillance program. It is not intended to diagnose MRSA infection nor to guide or monitor treatment for MRSA infections. Performed at [facility], [address] [place], [place] [address]  Culture, blood (Routine x 2)     Status: None (Preliminary result)   Collection Time: 01/05/18  3:25 AM  Result Value Ref Range Status   Specimen Description   Final    BLOOD LEFT ANTECUBITAL Performed at [facility], [address], [place], [place] [address]    Special Requests   Final    BOTTLES DRAWN AEROBIC AND ANAEROBIC Blood Culture adequate volume Performed at [facility], [address], [place], [place] [address]    Culture   Final    NO GROWTH 2 DAYS Performed at [facility], [address], [place], [place] [address]    Report Status PENDING  Incomplete  CSF culture     Status: None (Preliminary result)   Collection Time: 01/06/18  1:29 PM  Result Value Ref Range Status   Specimen Description   Final    CSF Performed at [facility], [address] [place], [place] [address]    Special Requests   Final    NONE Performed at [facility], [address], [place], [place] [address]    Gram Stain   Final    WBC PRESENT, PREDOMINANTLY MONONUCLEAR YEAST PRESENT CYTOSPIN SMEAR CRITICAL RESULT CALLED TO, READ BACK BY AND VERIFIED WITH: Alison Stalling. RN @1930  ON 02.20.19 BY COHEN,K Performed at [facility], [address] [place], [place] [address]    Culture   Final    NO GROWTH 1 DAY Performed at  [facility], [address], [place], [place] [address]    Report Status PENDING  Incomplete  Urine Culture     Status: None   Collection Time: 01/06/18  3:29 PM  Result Value Ref Range Status   Specimen Description   Final    URINE, CATHETERIZED Performed at [facility] [address] [place], [place] [address]    Special Requests   Final    NONE Performed at [facility], [address], [place], [place] [address]    Culture   Final    NO GROWTH Performed at [facility], [address], [place], [place] [address]    Report Status 01/08/2018 FINAL  Final    [name], MD [facility] [phone] pager   [phone] cell 01/08/2018, 10:56 AM

## 2018-01-08 NOTE — Progress Notes (Signed)
Nutrition Follow-up  DOCUMENTATION CODES:   Severe malnutrition in context of chronic illness, Underweight  INTERVENTION:  - Will order Ensure Enlive once/day, this supplement provides 350 kcal and 20 grams of protein. - Will order Hormel Shake once/day at 2:00 PM, this supplement provides 520 kcal and 22 grams of protein.  - Will order daily multivitamin with minerals. - Continue to encourage PO intakes.  NUTRITION DIAGNOSIS:   Severe Malnutrition related to chronic illness(AIDS) as evidenced by percent weight loss, energy intake < 75% for > 7 days, moderate fat depletion, severe muscle depletion. -ongoing  GOAL:   Patient will meet greater than or equal to 90% of their needs -unmet  MONITOR:   PO intake, Supplement acceptance, Weight trends, Labs  ASSESSMENT:   57 y.o. male with PMH including AIDS, cocaine abuse and hepatitis B.  Pt was extubated on 2/20 and OGT removed at that time. SLP saw pt and recommended current diet: Dysphagia 2, thin liquids. Diet advanced from NPO to current order yesterday at 11:20 AM and no intakes documented since that time. Continue to monitor weight trends closely. Estimated nutrition needs updated at this time d/t extubation.  Medications reviewed; 20 mg IV Pepcid/day, sliding scale Novolog, 2 g IV Mg sulfate x1 run today, 40 mEq oral KCl x1 dose today.  Labs reviewed; CBGs: 100, 118, 142, and 115 mg/dL today, BUN: 45 mg/dL, creatinine: 1.612.22 mg/dL, Ca: 7.7 mg/dL, LFTs elevated, GFR: 36 mL/min.   IVF: D5-20 mEq KCl @ 100 mL/hr (208 kcal).        Diet Order:  DIET DYS 2 Room service appropriate? Yes; Fluid consistency: Thin  EDUCATION NEEDS:   Not appropriate for education at this time  Skin:  Skin Assessment: Reviewed RN Assessment  Last BM:  2/22  Height:   Ht Readings from Last 1 Encounters:  01/04/18 5\' 6"  (1.676 m)    Weight:   Wt Readings from Last 1 Encounters:  01/08/18 107 lb 12.9 oz (48.9 kg)    Ideal Body Weight:   64.5 kg  BMI:  Body mass index is 17.4 kg/m.  Estimated Nutritional Needs:   Kcal:  1710-1955 (35-40 kcal/kg)  Protein:  73-88 grams (1.5-1.8 grams/kg)  Fluid:  >/= 1.8 L/day      Trenton GammonJessica Annastacia Duba, MS, RD, LDN, [facility] Inpatient Clinical Dietitian Pager # [phone] After hours/weekend pager # [phone]

## 2018-01-09 LAB — GLUCOSE, CAPILLARY
GLUCOSE-CAPILLARY: 101 mg/dL — AB (ref 65–99)
GLUCOSE-CAPILLARY: 145 mg/dL — AB (ref 65–99)
GLUCOSE-CAPILLARY: 155 mg/dL — AB (ref 65–99)
GLUCOSE-CAPILLARY: 162 mg/dL — AB (ref 65–99)
Glucose-Capillary: 130 mg/dL — ABNORMAL HIGH (ref 65–99)
Glucose-Capillary: 87 mg/dL (ref 65–99)

## 2018-01-09 LAB — BASIC METABOLIC PANEL
Anion gap: 11 (ref 5–15)
BUN: 29 mg/dL — AB (ref 6–20)
CALCIUM: 8 mg/dL — AB (ref 8.9–10.3)
CO2: 16 mmol/L — ABNORMAL LOW (ref 22–32)
CREATININE: 1.5 mg/dL — AB (ref 0.61–1.24)
Chloride: 105 mmol/L (ref 101–111)
GFR calc Af Amer: 58 mL/min — ABNORMAL LOW (ref >60)
GFR, EST NON AFRICAN AMERICAN: 50 mL/min — AB (ref >60)
GLUCOSE: 110 mg/dL — AB (ref 65–99)
POTASSIUM: 3.7 mmol/L (ref 3.5–5.1)
Sodium: 132 mmol/L — ABNORMAL LOW (ref 135–145)

## 2018-01-09 LAB — CULTURE, BLOOD (ROUTINE X 2)
Culture: NO GROWTH
Special Requests: ADEQUATE

## 2018-01-09 LAB — CBC
HCT: 28.9 % — ABNORMAL LOW (ref 39.0–52.0)
HEMOGLOBIN: 9.3 g/dL — AB (ref 13.0–17.0)
MCH: 26.6 pg (ref 26.0–34.0)
MCHC: 32.2 g/dL (ref 30.0–36.0)
MCV: 82.6 fL (ref 78.0–100.0)
Platelets: 49 10*3/uL — ABNORMAL LOW (ref 150–400)
RBC: 3.5 MIL/uL — ABNORMAL LOW (ref 4.22–5.81)
RDW: 15.8 % — AB (ref 11.5–15.5)
WBC: 6.3 10*3/uL (ref 4.0–10.5)

## 2018-01-09 LAB — MAGNESIUM: Magnesium: 1.5 mg/dL — ABNORMAL LOW (ref 1.7–2.4)

## 2018-01-09 MED ORDER — MAGNESIUM SULFATE 2 GM/50ML IV SOLN
2.0000 g | Freq: Once | INTRAVENOUS | Status: AC
  Administered 2018-01-09: 2 g via INTRAVENOUS
  Filled 2018-01-09: qty 50

## 2018-01-09 MED ORDER — MAGNESIUM SULFATE 2 GM/50ML IV SOLN: 2.0000 g | Freq: Once | INTRAVENOUS | Status: DC

## 2018-01-09 MED ORDER — MEGESTROL ACETATE 400 MG/10ML PO SUSP
400.0000 mg | Freq: Every day | ORAL | Status: DC
  Administered 2018-01-10 – 2018-01-15 (×6): 400 mg via ORAL
  Filled 2018-01-09 (×6): qty 10

## 2018-01-09 MED ORDER — POTASSIUM CHLORIDE 10 MEQ/100ML IV SOLN
10.0000 meq | INTRAVENOUS | Status: AC
  Administered 2018-01-09 (×2): 10 meq via INTRAVENOUS
  Filled 2018-01-09 (×2): qty 100

## 2018-01-09 NOTE — Progress Notes (Signed)
PHARMACY BRIEF NOTE:   ELECTROLYTE REPLACEMENT PER LIPSOMAL AMPHOTERICIN B PROTOCOL  Ambisome 200 mg (4 mg/kg per ID) IV q24h in combination with flucytosine 1250 mg (25 mg/kg/dose) PO q6h  for cryptococcal meningitis. ID service was consulted and is actively following.  Recent Labs    01/08/18 0238 01/08/18 0736 01/09/18 0312  NA 139 136 132*  K 3.2* 4.5 3.7  CL 107 107 105  CO2 22 20* 16*  GLUCOSE 111* 146* 110*  BUN 50* 45* 29*  CREATININE 2.30* 2.22* 1.50*  CALCIUM 7.7* 7.7* 8.0*  MG 1.3*  --  1.5*  ALBUMIN  --  2.1*  --   ALKPHOS  --  114  --   AST  --  290*  --   ALT  --  432*  --   BILITOT  --  0.7  --     Assessment: - Potassium  3.7 with AM labs - Magnesium 1.5 - SCr 1.50, admitted with AKI.Marland Kitchen. Scr has improved since admission and is trending towards being WNL.    Plan, per Amphotericin B Protocol: - Potassium chloride 10meq IV x 2 runs per  Ambisome Protocol  - Magnesium 2 gr IV x1  - Continue NS 500 mL bolus IV before and after each infusion of Ambisome  - Continue daily BMET, Mag - Monitor SCr, UOP   Adalberto ColeNikola Natajah Derderian, PharmD, BCPS Pager [phone] 01/09/2018 7:28 AM

## 2018-01-09 NOTE — Progress Notes (Addendum)
Patient Demographics:    Kenneth Rivas, is a 57 y.o. male, DOB - 17-Aug-1961, UJW:[id number]  Admit date - 01/04/2018   Admitting Physician Gigi Gin, MD  Outpatient Primary MD for the patient is Daiva Eves, Lisette Grinder, MD  LOS - 5   Chief Complaint  Patient presents with  . Weakness        Subjective:    Kenneth Rivas today has no fevers, no emesis,  No chest pain, coherent, no pain, eating ok  Assessment  & Plan :    Active Problems:   Cachexia associated with AIDS (HCC)   Protein-calorie malnutrition, severe   AIDS (acquired immune deficiency syndrome) (HCC)   Cryptococcal meningoencephalitis (HCC)   Acute respiratory failure with hypoxia (HCC)   Septic shock (HCC)   Acute renal insufficiency   AKI (acute kidney injury) (HCC)  Brief Summary: 57 y.o. 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  Previously Deemed to be without capacity by psych in [place] and was suppose to go to snf but didn't.  Sister is NOK.  LP/CSF Studies- CSF showed 10 white blood cells with 86% lymphocytes and a moderately elevated total protein.  His CSF cryptococcal antigen was extremely elevated at greater than 2560.  Yeast were seen on stain.  Cultures are pending.    Plan:- 1)Severe Persistent Cryptococcal MeningoEncephalitis ----more coherent, denies headaches, persistent symptoms despite having undergone 2 rounds of induction chemotherapy over the past 6 months.  Repeat spinal fluid shows yeast on stain  which may reflect active infection or nonviable yeast following recent treatment.  Infectious disease recommends continue amphotericin and 5 flucytosine pending final CSF culture results.    2)FEN/Severe protein caloric malnutrition/cachexia-in the setting of underlying HIV infection and prolonged cryptococcal meningoencephalitis-dietitian consult appreciated, continue nutritional supplements, recheck and replace electrolytes, c/n D5 sol iv. Give Megace 400 mg daily, creatinine down t0 1.5, replace magnesium  3)Hiv/AIDS-  Infectious disease  recommends holding off on starting antiretroviral therapy until he has gone through repeat induction and at least 8 weeks of consolidation therapy to avoid immune reconstitution inflammatory syndrome.  Continue Bactrim DS 1 tablet Mondays Wednesdays and Fridays for PCP prophylaxis, continue azithromycin 1200 mg weekly for prophylaxis,.   4)Disposition-continue PT, patient will need skilled nursing facility rehab prior to returning home  5)HTN-stable, c/n metoprolol 25 mg twice daily, may use IV Hydralazine 10 mg  Every 4 hours Prn for systolic blood pressure over 160 mmhg  6)Hematology-leukopenia is resolved, thrombocytopenia persist, hemoglobin drifting down,  7)Social/Ethics- Previously Deemed to be without capacity by psych in [place] and was suppose to go to snf but didn't.  Sister is NOK. Full code    Transfer out of stepdown to telemetry  Code Status : Full   Disposition Plan  : SNF  Consults  :  ID/PCCM   DVT Prophylaxis  :  SCDs (low platelets)  Lab Results  Component Value Date   PLT 49 (L) 01/09/2018    Inpatient Medications  Scheduled Meds: . azithromycin  1,200 mg Oral Weekly  . chlorhexidine  15 mL Mouth Rinse BID  . dextrose  10 mL Intravenous Q24H  . dextrose  10 mL Intravenous Q24H  . feeding supplement (ENSURE ENLIVE)  237 mL Oral TID  . flucytosine  25 mg/kg Oral Q6H  . insulin aspart  1-3 Units Subcutaneous Q4H  . mouth  rinse  15 mL Mouth Rinse q12n4p  . [START ON 01/10/2018] megestrol  400 mg Oral Daily  . metoprolol tartrate  25 mg Oral BID  . multivitamin with minerals  1 tablet Oral Daily  . pantoprazole  40 mg Oral Daily  . sulfamethoxazole-trimethoprim  1 tablet Oral Once per day on Mon Wed Fri   Continuous Infusions: . sodium chloride    . sodium chloride 500 mL (01/09/18 1603)  . sodium chloride Stopped (01/08/18 2221)  . amphotericin  B  Liposome (AMBISOME) ADULT IV Stopped (01/08/18 2208)  . dextrose 5 % with KCl 20 mEq / L 20 mEq (01/09/18 0400)   PRN Meds:.sodium chloride, acetaminophen, acetaminophen, diphenhydrAMINE **OR** diphenhydrAMINE, hydrALAZINE, ondansetron (ZOFRAN) IV    Anti-infectives (From admission, onward)   Start     Dose/Rate Route Frequency Ordered Stop   01/08/18 1600  azithromycin (ZITHROMAX) tablet 1,200 mg     1,200 mg Oral Weekly 01/08/18 1420     01/06/18 1800  flucytosine (ANCOBON) capsule 1,250 mg  Status:  Discontinued     25 mg/kg  48.9 kg Oral Every 6 hours 01/06/18 1430 01/06/18 1454   01/06/18 1800  flucytosine (ANCOBON) capsule 1,250 mg     25 mg/kg  48.9 kg Oral Every 6 hours 01/06/18 1454     01/06/18 1700  amphotericin B liposome (AMBISOME) 200 mg in dextrose 5 % 500 mL IVPB     4 mg/kg  48.9 kg 250 mL/hr over 120 Minutes Intravenous Every 24 hours 01/06/18 1430     01/06/18 0900  sulfamethoxazole-trimethoprim (BACTRIM DS,SEPTRA DS) 800-160 MG per tablet 1 tablet     1 tablet Oral Once per day on Mon Wed Fri 01/05/18 1438     01/05/18 1800  azithromycin (ZITHROMAX) 200 MG/5ML suspension 1,200 mg  Status:  Discontinued     1,200 mg Per Tube Weekly 01/05/18 1717 01/08/18 1420   01/05/18 1600  azithromycin (ZITHROMAX) tablet 1,200 mg  Status:  Discontinued     1,200 mg Oral Weekly 01/05/18 1438 01/05/18 1717   01/05/18 1200  fluconazole (DIFLUCAN) 40 MG/ML suspension 400 mg  Status:  Discontinued     400 mg Per Tube Daily 01/05/18 1057 01/06/18 1012     01/05/18 0200  piperacillin-tazobactam (ZOSYN) IVPB 2.25 g  Status:  Discontinued     2.25 g 100 mL/hr over 30 Minutes Intravenous Every 8 hours 01/04/18 2044 01/07/18 1650   01/04/18 2015  piperacillin-tazobactam (ZOSYN) IVPB 3.375 g  Status:  Discontinued     3.375 g 100 mL/hr over 30 Minutes Intravenous  Once 01/04/18 2000 01/04/18 2005   01/04/18 2015  vancomycin (VANCOCIN) IVPB 1000 mg/200 mL premix  Status:  Discontinued     1,000 mg 200 mL/hr over 60 Minutes Intravenous  Once 01/04/18 2000 01/04/18 2005   01/04/18 1800  fluconazole (DIFLUCAN) 40 MG/ML suspension 400 mg     400 mg Per Tube  Once 01/04/18 1750 01/04/18 1949   01/04/18 1615  vancomycin (VANCOCIN) IVPB 1000 mg/200 mL  premix     1,000 mg 200 mL/hr over 60 Minutes Intravenous  Once 01/04/18 1603 01/04/18 1914   01/04/18 1615  piperacillin-tazobactam (ZOSYN) IVPB 3.375 g     3.375 g 100 mL/hr over 30 Minutes Intravenous  Once 01/04/18 1603 01/04/18 1813        Objective:   Vitals:   01/09/18 1100 01/09/18 1200 01/09/18 1300 01/09/18 1400  BP: (!) 160/117 (!) 165/118 (!) 150/96 (!) 145/92  Pulse: 76 73 86 86  Resp: 14 (!) 22 13 (!) 21  Temp:  97.9 F (36.6 C)    TempSrc:  Axillary    SpO2: 100% 99% 98% 99%  Weight:      Height:        Wt Readings from Last 3 Encounters:  01/09/18 46.4 kg (102 lb 4.7 oz)  12/24/17 50.7 kg (111 lb 11.2 oz)  09/09/17 57.6 kg (127 lb)     Intake/Output Summary (Last 24 hours) at 01/09/2018 1625 Last data filed at 01/09/2018 1200 Gross per 24 hour  Intake 9051.25 ml  Output 4825 ml  Net 4226.25 ml     Physical Exam  Gen:- Awake Alert, cachectic/emaciated, cooperative HEENT:- [place].AT, No sclera icterus Neck-Supple Neck,No JVD,.  Lungs-mostly clear  CV- S1, S2 normal Abd-  +ve B.Sounds, Abd Soft, No tenderness,    Extremity/Skin:- No  edema,    Psych-affect is appropriate Neuro-no new focal deficits   Data Review:   Micro Results Recent Results (from the past  240 hour(s))  Culture, blood (Routine x 2)     Status: None   Collection Time: 01/04/18  3:53 PM  Result Value Ref Range Status   Specimen Description   Final    BLOOD RIGHT ARM Performed at [facility], [address] [place], [place] [address]    Special Requests   Final    IN PEDIATRIC BOTTLE Blood Culture adequate volume Performed at [facility], [address], [place], [place] [address]    Culture   Final    NO GROWTH 5 DAYS Performed at [facility], [address] [place], [place] [address]    Report Status 01/09/2018 FINAL  Final  Respiratory Panel by PCR     Status: None   Collection Time: 01/04/18  8:20 PM  Result Value Ref Range Status   Adenovirus NOT DETECTED NOT DETECTED Final   Coronavirus 229E NOT DETECTED NOT DETECTED Final   Coronavirus HKU1 NOT DETECTED NOT DETECTED Final   Coronavirus NL63 NOT DETECTED NOT DETECTED Final   Coronavirus OC43 NOT DETECTED NOT DETECTED Final   Metapneumovirus NOT DETECTED NOT DETECTED Final   Rhinovirus / Enterovirus NOT DETECTED NOT DETECTED Final   Influenza A NOT DETECTED NOT DETECTED Final   Influenza B NOT DETECTED NOT DETECTED Final   Parainfluenza Virus 1 NOT DETECTED NOT DETECTED Final   Parainfluenza Virus 2 NOT DETECTED NOT DETECTED Final   Parainfluenza Virus 3 NOT DETECTED NOT DETECTED Final   Parainfluenza Virus 4 NOT DETECTED NOT DETECTED Final   Respiratory Syncytial Virus NOT DETECTED NOT DETECTED Final   Bordetella pertussis NOT DETECTED NOT DETECTED Final   Chlamydophila pneumoniae NOT DETECTED NOT DETECTED Final   Mycoplasma pneumoniae NOT DETECTED NOT DETECTED Final  Culture, Urine     Status: Abnormal   Collection Time: 01/04/18  8:20 PM  Result Value Ref Range Status   Specimen Description   Final    URINE, CATHETERIZED Performed at [facility], [address] [place], [place] [address]    Special Requests   Final    NONE Performed  at [facility], [address] [place], [place] [address]    Culture MULTIPLE SPECIES PRESENT, SUGGEST RECOLLECTION (A)  Final   Report Status 01/06/2018 FINAL  Final  MRSA PCR Screening     Status: None   Collection Time: 01/04/18  8:21 PM  Result Value Ref Range Status   MRSA by PCR NEGATIVE NEGATIVE Final    Comment:        The GeneXpert MRSA Assay (FDA approved for NASAL specimens only), is one component of a comprehensive MRSA colonization surveillance program. It is not intended to diagnose MRSA infection nor to guide or monitor treatment for MRSA infections. Performed at [facility], [address] [place], [place] [address]   Culture, blood (Routine x 2)     Status: None (Preliminary result)   Collection Time: 01/05/18  3:25 AM  Result Value Ref Range Status   Specimen Description   Final    BLOOD LEFT ANTECUBITAL Performed at [facility], [address] [place], [place] [address]    Special Requests   Final    BOTTLES DRAWN AEROBIC AND ANAEROBIC Blood Culture adequate volume Performed at [facility], [address] [place], [place] [address]    Culture   Final    NO GROWTH 4 DAYS Performed at [facility], [address] [place], [place] [address]    Report Status PENDING  Incomplete  CSF culture     Status: None (Preliminary result)   Collection Time: 01/06/18  1:29 PM  Result Value Ref Range Status   Specimen Description CSF  Final   Special Requests NONE  Final   Gram Stain   Final    WBC PRESENT, PREDOMINANTLY MONONUCLEAR YEAST PRESENT CYTOSPIN SMEAR CRITICAL RESULT CALLED TO, READ BACK BY AND VERIFIED WITH: Alison StallingWILLING,K. RN @1930  ON 02.20.19 BY COHEN,K Performed at [facility], [address] [place], [place] [address]    Culture PENDING  Incomplete   Report Status PENDING  Incomplete  Fungus Culture With Stain     Status: None (Preliminary  result)   Collection Time: 01/06/18  1:29 PM  Result Value Ref Range Status   Fungus Stain Final report  Final    Comment: (NOTE) Performed At: [facility] [address] [place], [place] [id number] Jolene SchimkeNagendra Sanjai MD LK:[id number]:[phone]    Fungus (Mycology) Culture PENDING  Incomplete   Fungal Source CSF  Final    Comment: Performed at [facility], [address] [place], [place] [address]  Fungus Culture Result     Status: None   Collection Time: 01/06/18  1:29 PM  Result Value Ref Range Status   Result 1 Comment  Final    Comment: (NOTE) KOH/Calcofluor preparation:  no fungus observed. Performed At: [facility] [address] [place], [place] [id number] Jolene SchimkeNagendra Sanjai MD LO:[id number]:[phone] Performed at [facility], [address] [place], [place] [address]   Urine Culture     Status: None   Collection Time: 01/06/18  3:29 PM  Result Value Ref Range Status   Specimen Description   Final    URINE, CATHETERIZED Performed at [facility], [address] [place], [place] [address]    Special Requests   Final    NONE Performed at [facility], [address]  [place], [place] [address]    Culture   Final    NO GROWTH Performed at [facility], [address] [place], [place] [address]    Report Status 01/08/2018 FINAL  Final    Radiology Reports Dg Abd 1 View  Result Date: 01/06/2018 CLINICAL DATA:  OG tube placement. EXAM: ABDOMEN - 1 VIEW COMPARISON:  Radiograph 01/04/2018 FINDINGS: Tip of the enteric tube below the diaphragm in the stomach, side-port in the region of the a gastroesophageal junction, advancement of least 3 cm would lead to optimal placement. Right femoral catheter tip is in the left pelvis directed inferiorly likely in the left iliac vessels. No bowel dilatation to suggest obstruction. Phleboliths in the pelvis. IMPRESSION: 1. Side port of the enteric tube in the region of the  gastroesophageal junction, recommend advancement of at least 3 cm. 2. Right femoral catheter in place, tip to the left of midline directed inferiorly in the region of the left common iliac vessels. Recommend repositioning. Electronically Signed   By: Rubye Oaks M.D.   On: 01/06/2018 04:26   Ct Head Wo Contrast  Result Date: 01/04/2018 CLINICAL DATA:  Altered mental status. HIV positive with history of cryptococcal meningitis. EXAM: CT HEAD WITHOUT CONTRAST TECHNIQUE: Contiguous axial images were obtained from the base of the skull through the vertex without intravenous contrast. COMPARISON:  Head CT 11/26/2017 and MRI brain 06/25/2017 FINDINGS: Brain: Age advanced cerebral atrophy, ventriculomegaly and periventricular white matter disease. No acute intracranial findings or mass lesions. No extra-axial fluid collections. Vascular: No hyperdense vessels or obvious aneurysm. Skull: No skull fracture or bone lesion. Sinuses/Orbits: The paranasal sinuses and mastoid air cells are clear. The globes are intact. Other: No scalp lesions or hematoma. IMPRESSION: Chronic brain changes as described above.  No acute findings. Electronically Signed   By: Rudie Meyer M.D.   On: 01/04/2018 20:54   US Renal  Result Date: 01/05/2018 CLINICAL DATA:  Acute kidney injury EXAM: RENAL / URINARY TRACT ULTRASOUND COMPLETE COMPARISON:  Overlapping portions from CT chest dated 06/17/2017 FINDINGS: Right Kidney: Length: 10.9 cm. The upper pole the right kidney seems slightly hypoechoic with respect to the lower pole, although some of this may be secondary to poor characterization of the upper pole. No mass or hydronephrosis visualized. No stones are noted. Left Kidney: Length: 9.6 cm. Heterogeneity in parenchymal echogenicity with the lower pole seemingly slightly hypoechoic compared to the upper pole, significance uncertain. No mass or hydronephrosis visualized. Bladder: The bladder is empty and appears otherwise normal for  degree of bladder distention. A Foley catheter is in place. IMPRESSION: 1. Mild heterogeneity and renal parenchymal echogenicity bilaterally as noted above. Possibilities might include glomerular nephritis or pyelonephritis. No definite perirenal fluid collection. Correlate with urine analysis in determining whether further workup is warranted. 2. No stones or hydronephrosis identified. Electronically Signed   By: Gaylyn Rong M.D.   On: 01/05/2018 10:50   Dg Chest Port 1 View  Result Date: 01/07/2018 CLINICAL DATA:  Respiratory failure. EXAM: PORTABLE CHEST 1 VIEW COMPARISON:  01/06/2018. FINDINGS: Interim extubation and removal of NG tube. Mediastinum and hilar structures are normal. Heart size stable. No focal infiltrate. No acute pulmonary disease identified. No pleural effusion or pneumothorax. Heart size normal. IMPRESSION: 1.  Interim extubation removal of NG tube. 2.  No acute pulmonary disease identified. Electronically Signed   By: Maisie Fus  Register   On: 01/07/2018 06:51   Dg Chest Port 1 View  Result Date: 01/06/2018 CLINICAL DATA:  Re-intubation. Acute respiratory failure with hypoxia. EXAM: PORTABLE CHEST 1 VIEW COMPARISON:  Radiograph yesterday at 0432 hour FINDINGS: Endotracheal tube 4.7 cm from the carina. Enteric tube in place with side-port in the region of the gastroesophageal junction. Mild hyperinflation. Mild infrahilar atelectasis. No focal airspace opacity. No pleural fluid or pneumothorax. IMPRESSION: 1. Endotracheal tube tip at the thoracic inlet. Enteric tube in place. 2. Mild infrahilar atelectasis. Electronically Signed   By: Rubye Oaks M.D.   On: 01/06/2018 04:30   Dg Chest Port 1 View  Result Date: 01/05/2018 CLINICAL DATA:  Respiratory failure EXAM: PORTABLE CHEST 1 VIEW COMPARISON:  01/04/2018 FINDINGS: Endotracheal tube is unchanged. NG tube is been placed into the stomach. The side port is near the GE junction. Lungs are clear. Heart is normal size. No  effusions or acute bony abnormality. IMPRESSION: No active disease. Electronically Signed   By: Charlett Nose M.D.   On: 01/05/2018 07:26   Dg Chest Portable 1 View  Result Date: 01/04/2018 CLINICAL DATA:  Encounter for endotracheal tube position. EXAM: PORTABLE CHEST 1 VIEW COMPARISON:  11/26/2017 FINDINGS: The heart size and mediastinal contours are within normal limits. Tip of endotracheal tube is 5.4 cm above the carina. Both lungs are hyperinflated but clear. No pneumothorax or effusion. Costophrenic angles are excluded on this study bilaterally. The visualized skeletal structures are unremarkable. IMPRESSION: Satisfactory endotracheal tube position with the tip 5.4 cm above the carina. Hyperinflated lungs. Electronically Signed   By: Tollie Eth M.D.   On: 01/04/2018 18:13   Dg Abd Portable 1v  Result Date: 01/04/2018 CLINICAL DATA:  NG tube placement. EXAM: PORTABLE ABDOMEN - 1 VIEW COMPARISON:  None. FINDINGS: The NG tube tip is in the fundal region of the stomach. The proximal port is in the distal esophagus. The tube could be advanced several cm. IMPRESSION: NG tube tip is in the fundal region the stomach with the proximal port in the distal esophagus. Electronically Signed   By: Rudie Meyer M.D.   On: 01/04/2018 19:41   Dg Fluoro Guide Lumbar Puncture  Result Date: 12/17/2017 CLINICAL DATA:  Cryptococcal meningoencephalitis EXAM: DIAGNOSTIC LUMBAR PUNCTURE UNDER FLUOROSCOPIC GUIDANCE FLUOROSCOPY TIME:  Fluoroscopy Time:  0 minutes, 36 seconds Radiation Exposure Index (if provided by the fluoroscopic device): 14.3 mGy Number of Acquired Spot Images: 0 PROCEDURE: I discussed the risks (including hemorrhage, infection, headache, and nerve damage, among others), benefits, and alternatives to fluoroscopically guided lumbar puncture by telephone with the patient's sister, who is guiding medical decision-making on behalf of the patient due to his altered mental status. We specifically discussed the  high technical likelihood of success of the procedure. She understood and elected for the patient to undergo the procedure. Standard time-out was employed. Following sterile skin prep and local anesthetic administration consisting of 1 percent lidocaine, a 22 gauge spinal needle was advanced without difficulty into the thecal sac at the at the L2-3 level. Clear CSF was returned. Opening pressure was 17 cm of water. 10 cc of clear CSF was collected. The CSF return was very sluggish. The needle was subsequently removed and the skin cleansed and bandaged. No immediate complications were observed. IMPRESSION: 1. Technically successful fluoroscopically guided lumbar puncture at the L2-3 level. Opening pressure was 17 cm of water. Electronically Signed   By: Gaylyn Rong M.D.   On: 12/17/2017 16:27   Dg Fluoro Guide Lumbar Puncture  Result Date: 12/11/2017 CLINICAL DATA:  Cryptococcal meningitis EXAM: DIAGNOSTIC LUMBAR PUNCTURE UNDER FLUOROSCOPIC GUIDANCE FLUOROSCOPY TIME:  Fluoroscopy Time:  45 sec Radiation Exposure Index (if provided by the fluoroscopic device): 11.41 mGy Number of Acquired Spot Images: 0 PROCEDURE: Informed consent was obtained from the patient prior to the procedure, including potential complications of headache, allergy, and pain. With the patient prone, the lower back was prepped with Betadine. 1% Lidocaine was used for local anesthesia. Lumbar puncture was performed at the L3-4 level using a 20 gauge needle with return of clear CSF with an opening pressure of 38 cm water. Eight ml of CSF were obtained for laboratory studies. The patient tolerated the procedure well and there were no apparent complications. IMPRESSION: Successful fluoroscopic guided lumbar puncture as above. Electronically Signed   By: Charlett Nose M.D.   On: 12/11/2017 15:05     CBC Recent Labs  Lab 01/04/18 1637 01/05/18 0450 01/06/18 0500 01/07/18 0248 01/08/18 0238 01/09/18 0312  WBC 10.3 7.9 5.2 5.9 3.9*  6.3  HGB 12.6* 8.3* 6.4* 9.2* 10.1* 9.3*  HCT 38.3* 24.7* 18.5* 28.8* 32.9* 28.9*  PLT 321 168 139* 125* 63* 49*  MCV 84.5 80.7 80.8 84.2 85.0 82.6  MCH 27.8 27.1 27.9 26.9 26.1 26.6  MCHC 32.9 33.6 34.6 31.9 30.7 32.2  RDW 15.5 15.3 14.9 15.1 15.6* 15.8*  LYMPHSABS 0.9  --   --   --   --   --   MONOABS 0.9  --   --   --   --   --   EOSABS 0.0  --   --   --   --   --   BASOSABS 0.0  --   --   --   --   --     Chemistries  Recent Labs  Lab 01/04/18 1637  01/05/18 1725 01/06/18 0500 01/07/18 0248 01/08/18 0238 01/08/18 0736 01/09/18 0312  NA 147*   < >  --  146* 152* 139 136 132*  K 6.1*   < >  --  4.2 2.9* 3.2* 4.5 3.7  CL 116*   < >  --  107 113* 107 107 105  CO2 8*   < >  --  26 25 22  20* 16*  GLUCOSE 225*   < >  --  118* 92 111* 146* 110*  BUN 148*   < >  --  107* 88* 50* 45* 29*  CREATININE 6.30*   < >  --  4.47* 3.39* 2.30* 2.22* 1.50*  CALCIUM 9.7   < >  --  6.1* 7.9* 7.7* 7.7* 8.0*  MG 3.5*   < > 2.2 1.8 1.8 1.3*  --  1.5*  AST 35  --   --  759*  --   --  290*  --   ALT 19  --   --  502*  --   --  432*  --   ALKPHOS 129*  --   --  83  --   --  114  --   BILITOT 0.7  --   --  0.7  --   --  0.7  --    < > = values in this interval not displayed.   ------------------------------------------------------------------------------------------------------------------ No results for input(s): CHOL, HDL, LDLCALC, TRIG, CHOLHDL, LDLDIRECT in the last 72 hours.  No results found for: HGBA1C ------------------------------------------------------------------------------------------------------------------ No results for input(s): TSH, T4TOTAL, T3FREE, THYROIDAB in the last 72 hours.  Invalid input(s): FREET3 ------------------------------------------------------------------------------------------------------------------ No results for input(s): VITAMINB12, FOLATE, FERRITIN, TIBC, IRON, RETICCTPCT in the last 72 hours.  Coagulation profile Recent Labs  Lab 01/04/18 1637    INR 1.31    No results for input(s): DDIMER in the last 72 hours.  Cardiac Enzymes Recent Labs  Lab 01/04/18 [id number]  TROPONINI 0.05*   ------------------------------------------------------------------------------------------------------------------ No results found for: BNP   Shon Hale M.D on 01/09/2018 at 4:25 PM  Between 7am to 7pm - Pager - [phone]  After 7pm go to [email or web address] - password [email or web address]  [facility] -  Office  [phone]   Voice Recognition [employer]ubin [place] dictation system was used to create this note, attempts have been made to correct [email or web address] Please contact the author with questions and/or clarifications.

## 2018-01-10 LAB — GLUCOSE, CAPILLARY
GLUCOSE-CAPILLARY: 99 mg/dL (ref 65–99)
GLUCOSE-CAPILLARY: 117 mg/dL — AB (ref 65–99)
GLUCOSE-CAPILLARY: 101 mg/dL — AB (ref 65–99)
Glucose-Capillary: 98 mg/dL (ref 65–99)
Glucose-Capillary: 172 mg/dL — ABNORMAL HIGH (ref 65–99)

## 2018-01-10 LAB — COMPREHENSIVE METABOLIC PANEL
ALK PHOS: 152 U/L — AB (ref 38–126)
ALT: 217 U/L — ABNORMAL HIGH (ref 17–63)
AST: 84 U/L — ABNORMAL HIGH (ref 15–41)
Albumin: 2.3 g/dL — ABNORMAL LOW (ref 3.5–5.0)
Anion gap: 8 (ref 5–15)
BILIRUBIN TOTAL: 1.1 mg/dL (ref 0.3–1.2)
BUN: 28 mg/dL — ABNORMAL HIGH (ref 6–20)
CALCIUM: 8 mg/dL — AB (ref 8.9–10.3)
CO2: 16 mmol/L — ABNORMAL LOW (ref 22–32)
Chloride: 104 mmol/L (ref 101–111)
Creatinine, Ser: 1.19 mg/dL (ref 0.61–1.24)
GFR calc non Af Amer: >60 mL/min (ref >60)
GLUCOSE: 120 mg/dL — AB (ref 65–99)
POTASSIUM: 4.1 mmol/L (ref 3.5–5.1)
Sodium: 128 mmol/L — ABNORMAL LOW (ref 135–145)
TOTAL PROTEIN: 7.5 g/dL (ref 6.5–8.1)

## 2018-01-10 LAB — MAGNESIUM: Magnesium: 1.6 mg/dL — ABNORMAL LOW (ref 1.7–2.4)

## 2018-01-10 LAB — CULTURE, BLOOD (ROUTINE X 2)
Culture: NO GROWTH
Special Requests: ADEQUATE

## 2018-01-10 LAB — BASIC METABOLIC PANEL
Anion gap: 9 (ref 5–15)
BUN: 26 mg/dL — ABNORMAL HIGH (ref 6–20)
CALCIUM: 8 mg/dL — AB (ref 8.9–10.3)
CO2: 15 mmol/L — AB (ref 22–32)
CREATININE: 1.2 mg/dL (ref 0.61–1.24)
Chloride: 103 mmol/L (ref 101–111)
GLUCOSE: 140 mg/dL — AB (ref 65–99)
Potassium: 3.9 mmol/L (ref 3.5–5.1)
Sodium: 127 mmol/L — ABNORMAL LOW (ref 135–145)

## 2018-01-10 LAB — CSF CULTURE W GRAM STAIN

## 2018-01-10 LAB — CSF CULTURE: CULTURE: NO GROWTH

## 2018-01-10 MED ORDER — MAGNESIUM SULFATE 2 GM/50ML IV SOLN
2.0000 g | Freq: Once | INTRAVENOUS | Status: AC
  Administered 2018-01-10: 2 g via INTRAVENOUS
  Filled 2018-01-10: qty 50

## 2018-01-10 NOTE — Progress Notes (Signed)
Patient Demographics:    Kenneth Rivas, is a 57 y.o. male, DOB - [birth year], WUJ:[id number]  Admit date - 01/04/2018   Admitting Physician Gigi Gin, MD  Outpatient Primary MD for the patient is Daiva Eves, Lisette Grinder, MD  LOS - 6   Chief Complaint  Patient presents with  . Weakness        Subjective:    Kenneth Rivas today has no fevers, no emesis,  Remembers sister's visit, cooperative  Assessment  & Plan :    Active Problems:   Cachexia associated with AIDS (HCC)   Protein-calorie malnutrition, severe   AIDS (acquired immune deficiency syndrome) (HCC)   Cryptococcal meningoencephalitis (HCC)   Acute respiratory failure with hypoxia (HCC)   Septic shock (HCC)   Acute renal insufficiency   AKI (acute kidney injury) (HCC)  Brief Summary: 57 y.o. 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  Previously Deemed to be without capacity by psych in [place] and was suppose to go to snf but didn't.  Sister is [facility] (Ms Eilleen Kempf--- [phone]).  LP/CSF Studies- CSF showed 10 white blood cells with 86% lymphocytes and a moderately elevated total protein.  His CSF cryptococcal antigen was extremely elevated at greater than 2560.  Yeast were seen on stain.  Cultures are pending.    Plan:- 1)Severe Persistent Cryptococcal MeningoEncephalitis ----continues to improve from a cognitive standpoint, denies headaches, persistent symptoms despite having undergone 2 rounds of induction chemotherapy over  the past 6 months.  Repeat spinal fluid shows yeast on stain which may reflect active infection or nonviable yeast following recent treatment.  Infectious disease recommends continuing amphotericin and 5 flucytosine pending final CSF culture results   2)FEN/Severe protein caloric malnutrition/Hiv Related Cachexia-in the setting of underlying HIV infection and prolonged cryptococcal meningoencephalitis-dietitian consult appreciated, continue nutritional supplements, recheck and replace electrolytes, c/n D5 sol iv. C/n  Megace 400 mg daily, creatinine down to 1.1, replace magnesium  3)Hiv/AIDS-  Infectious disease  recommends holding off on starting antiretroviral therapy until he has gone through repeat induction and at least 8 weeks of consolidation therapy to avoid immune reconstitution inflammatory syndrome.  Continue Bactrim DS 1 tablet Mondays Wednesdays and Fridays for PCP prophylaxis, continue azithromycin 1200 mg weekly for prophylaxis,.   4)Disposition-continue PT, patient will need skilled nursing facility rehab prior to returning home, patient previously refused skilled nursing facility placement, it would be unsafe to discharge patient home, Sister is [facility] (Ms Eilleen Kempf--- [phone]).  5)HTN-stable, c/n metoprolol 25 mg twice daily, may use IV Hydralazine 10 mg  Every 4 hours Prn for systolic blood pressure over 160 mmhg  6)Hematology-leukopenia is resolved (WBC 6.3), thrombocytopenia persist, hemoglobin drifting down (9.3)  7)Social/Ethics- Previously Deemed to be without capacity by psych in [place] and was suppose to go to snf but didn't.    D/w Sister is [facility] (Ms Eilleen Kempf--- [phone]), she is looking  at ways to get power of attorney [facility]) so she can Help him make decisions,  Full code   Transfer out of stepdown to telemetry  Code Status : Full   Disposition Plan  : SNF  Consults  :  ID/PCCM   DVT Prophylaxis  :  SCDs (low platelets)  Lab Results  Component  Value Date   PLT 49 (L) 01/09/2018    Inpatient Medications  Scheduled Meds: . azithromycin  1,200 mg Oral Weekly  . chlorhexidine  15 mL Mouth Rinse BID  . dextrose  10 mL Intravenous Q24H  . dextrose  10 mL Intravenous Q24H  . feeding supplement (ENSURE ENLIVE)  237 mL Oral TID  . flucytosine  25 mg/kg Oral Q6H  . insulin aspart  1-3 Units Subcutaneous Q4H  . mouth rinse  15 mL Mouth Rinse q12n4p  . megestrol  400 mg Oral Daily  . metoprolol tartrate  25 mg Oral BID  . multivitamin with minerals  1 tablet Oral Daily  . pantoprazole  40 mg Oral Daily  . sulfamethoxazole-trimethoprim  1 tablet Oral Once per day on Mon Wed Fri   Continuous Infusions: . sodium chloride    . sodium chloride Stopped (01/10/18 1800)  . sodium chloride Stopped (01/09/18 2200)  . amphotericin  B  Liposome (AMBISOME) ADULT IV 200 mg (01/10/18 1804)  . dextrose 5 % with KCl 20 mEq / L 20 mEq (01/10/18 1404)   PRN Meds:.sodium chloride, acetaminophen, acetaminophen, diphenhydrAMINE **OR** diphenhydrAMINE, hydrALAZINE, ondansetron (ZOFRAN) IV    Anti-infectives (From admission, onward)   Start     Dose/Rate Route Frequency Ordered Stop   01/08/18 1600  azithromycin (ZITHROMAX) tablet 1,200 mg     1,200 mg Oral Weekly 01/08/18 1420     01/06/18 1800  flucytosine (ANCOBON) capsule 1,250 mg  Status:  Discontinued     25 mg/kg  48.9 kg Oral Every 6 hours 01/06/18 1430 01/06/18 1454   01/06/18 1800  flucytosine (ANCOBON) capsule 1,250 mg     25 mg/kg  48.9 kg Oral Every 6 hours 01/06/18 1454     01/06/18 1700  amphotericin B liposome (AMBISOME) 200 mg in dextrose 5 % 500 mL IVPB     4 mg/kg  48.9 kg 250 mL/hr over 120 Minutes Intravenous Every 24 hours 01/06/18 1430     01/06/18 0900  sulfamethoxazole-trimethoprim (BACTRIM DS,SEPTRA DS) 800-160 MG per tablet 1 tablet     1 tablet Oral Once per day on Mon Wed Fri 01/05/18 1438     01/05/18 1800  azithromycin (ZITHROMAX) 200 MG/5ML suspension 1,200 mg   Status:  Discontinued     1,200 mg Per Tube Weekly 01/05/18 1717 01/08/18 1420   01/05/18 1600  azithromycin (ZITHROMAX) tablet 1,200 mg  Status:  Discontinued     1,200 mg Oral Weekly 01/05/18 1438 01/05/18 1717   01/05/18 1200  fluconazole (DIFLUCAN) 40 MG/ML suspension 400 mg  Status:  Discontinued     400 mg Per Tube Daily 01/05/18 1057 01/06/18 1012   01/05/18 0200  piperacillin-tazobactam (ZOSYN) IVPB 2.25 g  Status:  Discontinued     2.25 g 100 mL/hr over 30 Minutes Intravenous Every 8 hours 01/04/18 2044 01/07/18 1650   01/04/18 2015  piperacillin-tazobactam (ZOSYN) IVPB 3.375 g  Status:  Discontinued     3.375 g 100 mL/hr over 30 Minutes Intravenous  Once 01/04/18 2000 01/04/18 2005   01/04/18 2015  vancomycin (VANCOCIN) IVPB 1000 mg/200 mL premix  Status:  Discontinued  1,000 mg 200 mL/hr over 60 Minutes Intravenous  Once 01/04/18 2000 01/04/18 2005   01/04/18 1800  fluconazole (DIFLUCAN) 40 MG/ML suspension 400 mg     400 mg Per Tube  Once 01/04/18 1750 01/04/18 1949   01/04/18 1615  vancomycin (VANCOCIN) IVPB 1000 mg/200 mL premix     1,000 mg 200 mL/hr over 60 Minutes Intravenous  Once 01/04/18 1603 01/04/18 1914   01/04/18 1615  piperacillin-tazobactam (ZOSYN) IVPB 3.375 g     3.375 g 100 mL/hr over 30 Minutes Intravenous  Once 01/04/18 1603 01/04/18 1813        Objective:   Vitals:   01/10/18 1500 01/10/18 1600 01/10/18 1700 01/10/18 1800  BP: 120/81 117/82 117/86 119/74  Pulse: 87 88 97 91  Resp: 15 15 (!) 26 20  Temp:   97.8 F (36.6 C)   TempSrc:   Oral   SpO2: 99% 98% 99% 100%  Weight:      Height:        Wt Readings from Last 3 Encounters:  01/10/18 43.6 kg (96 lb 1.9 oz)  12/24/17 50.7 kg (111 lb 11.2 oz)  09/09/17 57.6 kg (127 lb)     Intake/Output Summary (Last 24 hours) at 01/10/2018 1819 Last data filed at 01/10/2018 1804 Gross per 24 hour  Intake 1430 ml  Output 2450 ml  Net -1020 ml     Physical Exam  Gen:- Awake Alert,  cachectic/emaciated, cooperative HEENT:- [place].AT, No sclera icterus Neck-Supple Neck,No JVD,.  Lungs-mostly clear  CV- S1, S2 normal Abd-  +ve B.Sounds, Abd Soft, No tenderness,    Extremity/Skin:- No  edema,    Psych-affect is appropriate Neuro-no new focal deficits   Data Review:   Micro Results Recent Results (from the past 240 hour(s))  Culture, blood (Routine x 2)     Status: None   Collection Time: 01/04/18  3:53 PM  Result Value Ref Range Status   Specimen Description   Final    BLOOD RIGHT ARM Performed at [facility], [address] [place], [place] [address]    Special Requests   Final    IN PEDIATRIC BOTTLE Blood Culture adequate volume Performed at [facility], [address], [place], [place] [address]    Culture   Final    NO GROWTH 5 DAYS Performed at [facility], [address], [place], [place] [address]    Report Status 01/09/2018 FINAL  Final  Respiratory Panel by PCR     Status: None   Collection Time: 01/04/18  8:20 PM  Result Value Ref Range Status   Adenovirus NOT DETECTED NOT DETECTED Final   Coronavirus 229E NOT DETECTED NOT DETECTED Final   Coronavirus HKU1 NOT DETECTED NOT DETECTED Final   Coronavirus NL63 NOT DETECTED NOT DETECTED Final   Coronavirus OC43 NOT DETECTED NOT DETECTED Final   Metapneumovirus NOT DETECTED NOT DETECTED Final   Rhinovirus / Enterovirus NOT DETECTED NOT DETECTED Final   Influenza A NOT DETECTED NOT DETECTED Final   Influenza B NOT DETECTED NOT DETECTED Final   Parainfluenza Virus 1 NOT DETECTED NOT DETECTED Final   Parainfluenza Virus 2 NOT DETECTED NOT DETECTED Final   Parainfluenza Virus 3 NOT DETECTED NOT DETECTED Final   Parainfluenza Virus 4 NOT DETECTED NOT DETECTED Final   Respiratory Syncytial Virus NOT DETECTED NOT DETECTED Final   Bordetella pertussis NOT DETECTED NOT DETECTED Final   Chlamydophila pneumoniae NOT DETECTED NOT DETECTED Final   Mycoplasma  pneumoniae NOT DETECTED NOT DETECTED Final  Culture, Urine     Status: Abnormal   Collection Time: 01/04/18  8:20 PM  Result Value Ref Range Status   Specimen Description   Final    URINE, CATHETERIZED Performed at [facility], [address] [place], [place] [address]    Special Requests   Final    NONE Performed at [facility], [address], [place], [place] [address]    Culture MULTIPLE SPECIES PRESENT, SUGGEST RECOLLECTION (A)  Final   Report Status 01/06/2018 FINAL  Final  MRSA PCR Screening     Status: None   Collection Time: 01/04/18  8:21 PM  Result Value Ref Range Status   MRSA by PCR NEGATIVE NEGATIVE Final    Comment:        The GeneXpert MRSA Assay (FDA approved for NASAL specimens only), is one component of a comprehensive MRSA colonization surveillance program. It is not intended to diagnose MRSA infection nor to guide or monitor treatment for MRSA infections. Performed at [facility], [address] [place], [place] [address]   Culture, blood (Routine x 2)     Status: None   Collection Time: 01/05/18  3:25 AM  Result Value Ref Range Status   Specimen Description   Final    BLOOD LEFT ANTECUBITAL Performed at [facility], [address] [place], [place] [address]    Special Requests   Final    BOTTLES DRAWN AEROBIC AND ANAEROBIC Blood Culture adequate volume Performed at [facility], [address], [place], [place] [address]    Culture   Final    NO GROWTH 5 DAYS Performed at [facility], [address] [place], [place] [address]    Report Status 01/10/2018 FINAL  Final  CSF culture     Status: None   Collection Time: 01/06/18  1:29 PM  Result Value Ref Range Status   Specimen Description   Final    CSF Performed at [facility], [address], [place], [place] [address]    Special Requests   Final    NONE Performed  at [facility], [address] [place], [place] [address]    Gram Stain   Final    WBC PRESENT, PREDOMINANTLY MONONUCLEAR YEAST PRESENT CYTOSPIN SMEAR CRITICAL RESULT CALLED TO, READ BACK BY AND VERIFIED WITH: Alison Stalling. RN @1930  ON 02.20.19 BY COHEN,K Performed at [facility], [address], [place], [place] [address]    Culture   Final    NO GROWTH 3 DAYS Performed at [facility], [address], [place], [place] [address]    Report Status 01/10/2018 FINAL  Final  Fungus Culture With Stain     Status: None (Preliminary result)   Collection Time: 01/06/18  1:29 PM  Result Value Ref Range Status   Fungus Stain Final report  Final    Comment: (NOTE) Performed At: [facility] [address] [place], [place] [id number] Jolene Schimke MD [id number]    Fungus (Mycology) Culture PENDING  Incomplete   Fungal Source CSF  Final    Comment: Performed at [facility], [address] [place], [place] [address]  Fungus Culture Result     Status: None   Collection Time: 01/06/18  1:29 PM  Result Value Ref Range Status   Result 1 Comment  Final    Comment: (NOTE) KOH/Calcofluor preparation:  no  fungus observed. Performed At: [facility] [address] [place], [place] [id number] Jolene Schimke MD [id number] Performed at [facility], [address] [place], [place] [address]   Urine Culture     Status: None   Collection Time: 01/06/18  3:29 PM  Result Value Ref Range Status   Specimen Description   Final    URINE, CATHETERIZED Performed at [facility], [address], [place], [place] [address]    Special Requests   Final    NONE Performed at [facility], [address], [place], [place] [address]    Culture   Final    NO GROWTH Performed at [facility], [address], [place], [place] [address]    Report Status 01/08/2018 FINAL  Final     Radiology Reports Dg Abd 1 View  Result Date: 01/06/2018 CLINICAL DATA:  OG tube placement. EXAM: ABDOMEN - 1 VIEW COMPARISON:  Radiograph 01/04/2018 FINDINGS: Tip of the enteric tube below the diaphragm in the stomach, side-port in the region of the a gastroesophageal junction, advancement of least 3 cm would lead to optimal placement. Right femoral catheter tip is in the left pelvis directed inferiorly likely in the left iliac vessels. No bowel dilatation to suggest obstruction. Phleboliths in the pelvis. IMPRESSION: 1. Side port of the enteric tube in the region of the gastroesophageal junction, recommend advancement of at least 3 cm. 2. Right femoral catheter in place, tip to the left of midline directed inferiorly in the region of the left common iliac vessels. Recommend repositioning. Electronically Signed   By: Rubye Oaks M.D.   On: 01/06/2018 04:26   Ct Head Wo Contrast  Result Date: 01/04/2018 CLINICAL DATA:  Altered mental status. HIV positive with history of cryptococcal meningitis. EXAM: CT HEAD WITHOUT CONTRAST TECHNIQUE: Contiguous axial images were obtained from the base of the skull through the vertex without intravenous contrast. COMPARISON:  Head CT 11/26/2017 and MRI brain 06/25/2017 FINDINGS: Brain: Age advanced cerebral atrophy, ventriculomegaly and periventricular white matter disease. No acute intracranial findings or mass lesions. No extra-axial fluid collections. Vascular: No hyperdense vessels or obvious aneurysm. Skull: No skull fracture or bone lesion. Sinuses/Orbits: The paranasal sinuses and mastoid air cells are clear. The globes are intact. Other: No scalp lesions or hematoma. IMPRESSION: Chronic brain changes as described above.  No acute findings. Electronically Signed   By: Rudie Meyer M.D.   On: 01/04/2018 20:54   US Renal  Result Date: 01/05/2018 CLINICAL DATA:  Acute kidney injury EXAM: RENAL / URINARY TRACT ULTRASOUND COMPLETE COMPARISON:  Overlapping  portions from CT chest dated 06/17/2017 FINDINGS: Right Kidney: Length: 10.9 cm. The upper pole the right kidney seems slightly hypoechoic with respect to the lower pole, although some of this may be secondary to poor characterization of the upper pole. No mass or hydronephrosis visualized. No stones are noted. Left Kidney: Length: 9.6 cm. Heterogeneity in parenchymal echogenicity with the lower pole seemingly slightly hypoechoic compared to the upper pole, significance uncertain. No mass or hydronephrosis visualized. Bladder: The bladder is empty and appears otherwise normal for degree of bladder distention. A Foley catheter is in place. IMPRESSION: 1. Mild heterogeneity and renal parenchymal echogenicity bilaterally as noted above. Possibilities might include glomerular nephritis or pyelonephritis. No definite perirenal fluid collection. Correlate with urine analysis in determining whether further workup is warranted. 2. No stones or hydronephrosis identified. Electronically Signed   By: Gaylyn Rong M.D.   On: 01/05/2018  10:50   Dg Chest Port 1 View  Result Date: 01/07/2018 CLINICAL DATA:  Respiratory failure. EXAM: PORTABLE CHEST 1 VIEW COMPARISON:  01/06/2018. FINDINGS: Interim extubation and removal of NG tube. Mediastinum and hilar structures are normal. Heart size stable. No focal infiltrate. No acute pulmonary disease identified. No pleural effusion or pneumothorax. Heart size normal. IMPRESSION: 1.  Interim extubation removal of NG tube. 2.  No acute pulmonary disease identified. Electronically Signed   By: Maisie Fushomas  Register   On: 01/07/2018 06:51   Dg Chest Port 1 View  Result Date: 01/06/2018 CLINICAL DATA:  Re-intubation. Acute respiratory failure with hypoxia. EXAM: PORTABLE CHEST 1 VIEW COMPARISON:  Radiograph yesterday at 0432 hour FINDINGS: Endotracheal tube 4.7 cm from the carina. Enteric tube in place with side-port in the region of the gastroesophageal junction. Mild hyperinflation.  Mild infrahilar atelectasis. No focal airspace opacity. No pleural fluid or pneumothorax. IMPRESSION: 1. Endotracheal tube tip at the thoracic inlet. Enteric tube in place. 2. Mild infrahilar atelectasis. Electronically Signed   By: Rubye OaksMelanie  Ehinger M.D.   On: 01/06/2018 04:30   Dg Chest Port 1 View  Result Date: 01/05/2018 CLINICAL DATA:  Respiratory failure EXAM: PORTABLE CHEST 1 VIEW COMPARISON:  01/04/2018 FINDINGS: Endotracheal tube is unchanged. NG tube is been placed into the stomach. The side port is near the GE junction. Lungs are clear. Heart is normal size. No effusions or acute bony abnormality. IMPRESSION: No active disease. Electronically Signed   By: Charlett NoseKevin  Dover M.D.   On: 01/05/2018 07:26   Dg Chest Portable 1 View  Result Date: 01/04/2018 CLINICAL DATA:  Encounter for endotracheal tube position. EXAM: PORTABLE CHEST 1 VIEW COMPARISON:  11/26/2017 FINDINGS: The heart size and mediastinal contours are within normal limits. Tip of endotracheal tube is 5.4 cm above the carina. Both lungs are hyperinflated but clear. No pneumothorax or effusion. Costophrenic angles are excluded on this study bilaterally. The visualized skeletal structures are unremarkable. IMPRESSION: Satisfactory endotracheal tube position with the tip 5.4 cm above the carina. Hyperinflated lungs. Electronically Signed   By: Tollie Ethavid  Kwon M.D.   On: 01/04/2018 18:13   Dg Abd Portable 1v  Result Date: 01/04/2018 CLINICAL DATA:  NG tube placement. EXAM: PORTABLE ABDOMEN - 1 VIEW COMPARISON:  None. FINDINGS: The NG tube tip is in the fundal region of the stomach. The proximal port is in the distal esophagus. The tube could be advanced several cm. IMPRESSION: NG tube tip is in the fundal region the stomach with the proximal port in the distal esophagus. Electronically Signed   By: Rudie MeyerP.  Gallerani M.D.   On: 01/04/2018 19:41   Dg Fluoro Guide Lumbar Puncture  Result Date: 12/17/2017 CLINICAL DATA:  Cryptococcal  meningoencephalitis EXAM: DIAGNOSTIC LUMBAR PUNCTURE UNDER FLUOROSCOPIC GUIDANCE FLUOROSCOPY TIME:  Fluoroscopy Time:  0 minutes, 36 seconds Radiation Exposure Index (if provided by the fluoroscopic device): 14.3 mGy Number of Acquired Spot Images: 0 PROCEDURE: I discussed the risks (including hemorrhage, infection, headache, and nerve damage, among others), benefits, and alternatives to fluoroscopically guided lumbar puncture by telephone with the patient's sister, who is guiding medical decision-making on behalf of the patient due to his altered mental status. We specifically discussed the high technical likelihood of success of the procedure. She understood and elected for the patient to undergo the procedure. Standard time-out was employed. Following sterile skin prep and local anesthetic administration consisting of 1 percent lidocaine, a 22 gauge spinal needle was advanced without difficulty into the thecal sac at the at  the L2-3 level. Clear CSF was returned. Opening pressure was 17 cm of water. 10 cc of clear CSF was collected. The CSF return was very sluggish. The needle was subsequently removed and the skin cleansed and bandaged. No immediate complications were observed. IMPRESSION: 1. Technically successful fluoroscopically guided lumbar puncture at the L2-3 level. Opening pressure was 17 cm of water. Electronically Signed   By: Gaylyn Rong M.D.   On: 12/17/2017 16:27     CBC Recent Labs  Lab 01/04/18 1637 01/05/18 0450 01/06/18 0500 01/07/18 0248 01/08/18 0238 01/09/18 0312  WBC 10.3 7.9 5.2 5.9 3.9* 6.3  HGB 12.6* 8.3* 6.4* 9.2* 10.1* 9.3*  HCT 38.3* 24.7* 18.5* 28.8* 32.9* 28.9*  PLT 321 168 139* 125* 63* 49*  MCV 84.5 80.7 80.8 84.2 85.0 82.6  MCH 27.8 27.1 27.9 26.9 26.1 26.6  MCHC 32.9 33.6 34.6 31.9 30.7 32.2  RDW 15.5 15.3 14.9 15.1 15.6* 15.8*  LYMPHSABS 0.9  --   --   --   --   --   MONOABS 0.9  --   --   --   --   --   EOSABS 0.0  --   --   --   --   --   BASOSABS  0.0  --   --   --   --   --     Chemistries  Recent Labs  Lab 01/04/18 1637  01/06/18 0500 01/07/18 0248 01/08/18 0238 01/08/18 0736 01/09/18 0312 01/10/18 0730 01/10/18 0819  NA 147*   < > 146* 152* 139 136 132* 127* 128*  K 6.1*   < > 4.2 2.9* 3.2* 4.5 3.7 3.9 4.1  CL 116*   < > 107 113* 107 107 105 103 104  CO2 8*   < > 26 25 22  20* 16* 15* 16*  GLUCOSE 225*   < > 118* 92 111* 146* 110* 140* 120*  BUN 148*   < > 107* 88* 50* 45* 29* 26* 28*  CREATININE 6.30*   < > 4.47* 3.39* 2.30* 2.22* 1.50* 1.20 1.19  CALCIUM 9.7   < > 6.1* 7.9* 7.7* 7.7* 8.0* 8.0* 8.0*  MG 3.5*   < > 1.8 1.8 1.3*  --  1.5* 1.6*  --   AST 35  --  759*  --   --  290*  --   --  84*  ALT 19  --  502*  --   --  432*  --   --  217*  ALKPHOS 129*  --  83  --   --  114  --   --  152*  BILITOT 0.7  --  0.7  --   --  0.7  --   --  1.1   < > = values in this interval not displayed.   ------------------------------------------------------------------------------------------------------------------ No results for input(s): CHOL, HDL, LDLCALC, TRIG, CHOLHDL, LDLDIRECT in the last 72 hours.  No results found for: HGBA1C ------------------------------------------------------------------------------------------------------------------ No results for input(s): TSH, T4TOTAL, T3FREE, THYROIDAB in the last 72 hours.  Invalid input(s): FREET3 ------------------------------------------------------------------------------------------------------------------ No results for input(s): VITAMINB12, FOLATE, FERRITIN, TIBC, IRON, RETICCTPCT in the last 72 hours.  Coagulation profile Recent Labs  Lab 01/04/18 1637  INR 1.31    No results for input(s): DDIMER in the last 72 hours.  Cardiac Enzymes Recent Labs  Lab 01/04/18 1637  TROPONINI 0.05*   ------------------------------------------------------------------------------------------------------------------ No results found for: BNP   Shon Hale M.D on  01/10/2018 at 6:19 PM  Between 7am to 7pm - Pager - [phone]  After 7pm go to [email or web address] - password [email or web address]  [facility] -  Office  [phone]   Voice Recognition Viviann Spare dictation system was used to create this note, attempts have been made to correct errors. Please contact the author with questions and/or clarifications.

## 2018-01-10 NOTE — Progress Notes (Signed)
PHARMACY BRIEF NOTE:   ELECTROLYTE REPLACEMENT PER LIPSOMAL AMPHOTERICIN B PROTOCOL  Ambisome 200 mg (4 mg/kg per ID) IV q24h in combination with flucytosine 1250 mg (25 mg/kg/dose) PO q6h  for cryptococcal meningitis. ID service was consulted and is actively following.  Recent Labs    01/08/18 0736 01/09/18 0312 01/10/18 0730 01/10/18 0819  NA 136 132* 127* 128*  K 4.5 3.7 3.9 4.1  CL 107 105 103 104  CO2 20* 16* 15* 16*  GLUCOSE 146* 110* 140* 120*  BUN 45* 29* 26* 28*  CREATININE 2.22* 1.50* 1.20 1.19  CALCIUM 7.7* 8.0* 8.0* 8.0*  MG  --  1.5* 1.6*  --   ALBUMIN 2.1*  --   --  2.3*  ALKPHOS 114  --   --  152*  AST 290*  --   --  84*  ALT 432*  --   --  217*  BILITOT 0.7  --   --  1.1    Assessment: - Potassium  4.1 with AM labs - Magnesium 1.6 - SCr 1.19, admitted with AKI.Marland Kitchen. Scr has improved since admission and is trending towards being WNL.    Plan, per Amphotericin B Protocol: - No Potassium supplementation today  per [facility] Ambisome Protocol  - Magnesium 2 gr IV x1  - Continue NS 500 mL bolus IV before and after each infusion of Ambisome  - Continue daily BMET, Mag - Monitor SCr, UOP   Adalberto ColeNikola Tashi Andujo, PharmD, BCPS Pager [phone] 01/10/2018 9:34 AM

## 2018-01-11 DIAGNOSIS — L8990 Pressure ulcer of unspecified site, unspecified stage: Secondary | ICD-10-CM

## 2018-01-11 DIAGNOSIS — D696 Thrombocytopenia, unspecified: Secondary | ICD-10-CM

## 2018-01-11 LAB — GLUCOSE, CAPILLARY
GLUCOSE-CAPILLARY: 99 mg/dL (ref 65–99)
GLUCOSE-CAPILLARY: 92 mg/dL (ref 65–99)
Glucose-Capillary: 105 mg/dL — ABNORMAL HIGH (ref 65–99)
Glucose-Capillary: 127 mg/dL — ABNORMAL HIGH (ref 65–99)
Glucose-Capillary: 91 mg/dL (ref 65–99)
Glucose-Capillary: 91 mg/dL (ref 65–99)

## 2018-01-11 LAB — BASIC METABOLIC PANEL
ANION GAP: 8 (ref 5–15)
BUN: 27 mg/dL — ABNORMAL HIGH (ref 6–20)
CALCIUM: 7.9 mg/dL — AB (ref 8.9–10.3)
CO2: 14 mmol/L — ABNORMAL LOW (ref 22–32)
Chloride: 108 mmol/L (ref 101–111)
Creatinine, Ser: 1.18 mg/dL (ref 0.61–1.24)
Glucose, Bld: 134 mg/dL — ABNORMAL HIGH (ref 65–99)
POTASSIUM: 4.7 mmol/L (ref 3.5–5.1)
Sodium: 130 mmol/L — ABNORMAL LOW (ref 135–145)

## 2018-01-11 LAB — CBC
HCT: 26.2 % — ABNORMAL LOW (ref 39.0–52.0)
Hemoglobin: 8.5 g/dL — ABNORMAL LOW (ref 13.0–17.0)
MCH: 26.1 pg (ref 26.0–34.0)
MCHC: 32.4 g/dL (ref 30.0–36.0)
MCV: 80.4 fL (ref 78.0–100.0)
PLATELETS: 26 10*3/uL — AB (ref 150–400)
RBC: 3.26 MIL/uL — AB (ref 4.22–5.81)
RDW: 16.4 % — ABNORMAL HIGH (ref 11.5–15.5)
WBC: 6.2 10*3/uL (ref 4.0–10.5)

## 2018-01-11 LAB — MAGNESIUM: Magnesium: 2 mg/dL (ref 1.7–2.4)

## 2018-01-11 MED ORDER — FLUCONAZOLE 100 MG PO TABS
400.0000 mg | ORAL_TABLET | Freq: Every day | ORAL | Status: DC
  Administered 2018-01-11 – 2018-01-15 (×5): 400 mg via ORAL
  Filled 2018-01-11 (×5): qty 4

## 2018-01-11 NOTE — Progress Notes (Addendum)
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 Previously deemed to be without capacity by psych on 11/26/2017 and was suppose to go to SNF but went home with his roommate instead. After discharge, he had stopped taking his medications. He presented to the ED for altered mental status, inability to protect airway and was subsequently intubated on 2/18 and extubated 2/20. Patient is currently under treatment for severe persistent cryptococcal meningoencephalitis with amphotericin and 5 flucytosine.   Assessment & Plan:   Active Problems:   Cachexia associated with AIDS (HCC)   Protein-calorie malnutrition, severe   AIDS (acquired immune deficiency syndrome) (HCC)   Cryptococcal meningoencephalitis (HCC)   Acute respiratory failure with hypoxia (HCC)   Septic shock (HCC)   Acute renal insufficiency   AKI (acute kidney injury) (HCC)   Severe persistent cryptococcal meningoencephalitis  -Persistent despite having undergone 2 rounds of induction chemotherapy over the past 6 months. Repeat spinal fluid shows yeast on stain which may reflect active infection or nonviable yeast following recent treatment.Infectious disease recommends switching to fluconazole 400mg  daily   AIDS -Infectious disease recommends holding off on starting antiretroviral therapy until he has gone through repeat induction and at least 8 weeks of consolidation therapy to avoid immune reconstitution inflammatory syndrome. Continue Bactrim DS 1 tablet Mondays Wednesdays and Fridays for PCP prophylaxis, continue azithromycin 1200 mg weekly for prophylaxis,.   HTN -Continue  metoprolol 25 mg twice daily -Stable   Persistent thrombocytopenia -Likely related to flucytosine. No bleeding reported. -Monitor platelets  Severe protein caloric malnutrition -Dietitian consult   DVT prophylaxis: SCD Code Status: Full Family Communication: Sister over the phone 2/25  Disposition Plan: SNF when stable    Consultants:   PCCM  ID    Antimicrobials:  Anti-infectives (From admission, onward)   Start     Dose/Rate Route Frequency Ordered Stop   01/11/18 1800  fluconazole (DIFLUCAN) tablet 400 mg     400 mg Oral Daily 01/11/18 1057     01/08/18 1600  azithromycin (ZITHROMAX) tablet 1,200 mg     1,200 mg Oral Weekly 01/08/18 1420     01/06/18 1800  flucytosine (ANCOBON) capsule 1,250 mg  Status:  Discontinued     25 mg/kg  48.9 kg Oral Every 6 hours 01/06/18 1430 01/06/18 1454   01/06/18 1800  flucytosine (ANCOBON) capsule 1,250 mg  Status:  Discontinued     25 mg/kg  48.9 kg Oral Every 6 hours 01/06/18 1454 01/11/18 1057   01/06/18 1700  amphotericin B liposome (AMBISOME) 200 mg in dextrose 5 % 500 mL IVPB  Status:  Discontinued     4 mg/kg  48.9 kg 250 mL/hr over 120 Minutes Intravenous Every 24 hours 01/06/18 1430 01/11/18 1057   01/06/18 0900  sulfamethoxazole-trimethoprim (BACTRIM DS,SEPTRA DS) 800-160 MG per tablet 1 tablet     1 tablet Oral Once per day on Mon Wed Fri 01/05/18 1438     01/05/18 1800  azithromycin (ZITHROMAX) 200 MG/5ML suspension 1,200 mg  Status:  Discontinued     1,200 mg Per Tube Weekly 01/05/18 1717 01/08/18 1420   01/05/18 1600  azithromycin (ZITHROMAX) tablet 1,200 mg  Status:  Discontinued     1,200 mg Oral Weekly 01/05/18 1438 01/05/18 1717   01/05/18 1200  fluconazole (DIFLUCAN) 40 MG/ML suspension 400 mg  Status:  Discontinued     400 mg Per Tube Daily 01/05/18 1057 01/06/18 1012   01/05/18 0200  piperacillin-tazobactam (ZOSYN) IVPB 2.25 g  Status:  Discontinued     2.25 g 100 mL/hr over 30 Minutes Intravenous Every 8  hours 01/04/18 2044 01/07/18 1650   01/04/18 2015  piperacillin-tazobactam (ZOSYN) IVPB 3.375 g  Status:  Discontinued     3.375 g 100 mL/hr over 30 Minutes Intravenous  Once 01/04/18 2000 01/04/18 2005   01/04/18 2015  vancomycin (VANCOCIN) IVPB 1000 mg/200 mL premix  Status:  Discontinued     1,000 mg 200 mL/hr over 60 Minutes Intravenous  Once 01/04/18 2000 01/04/18 2005   01/04/18 1800  fluconazole (DIFLUCAN) 40 MG/ML suspension 400 mg     400 mg Per Tube  Once 01/04/18 1750 01/04/18 1949   01/04/18 1615  vancomycin (VANCOCIN) IVPB 1000 mg/200 mL premix     1,000 mg 200 mL/hr over 60 Minutes Intravenous  Once 01/04/18 1603 01/04/18 1914   01/04/18 1615  piperacillin-tazobactam (ZOSYN) IVPB 3.375 g     3.375 g 100 mL/hr over 30 Minutes Intravenous  Once 01/04/18 1603 01/04/18 1813       Subjective: No new complaints. Sitting in bed, oriented to self, place. Denies pain.   Objective: Vitals:   01/11/18 0500 01/11/18 0600 01/11/18 0652 01/11/18 0800  BP: 137/75 (!) 142/80  (!) 139/94  Pulse: 88 79  88  Resp: (!) 24 18  (!) 21  Temp:   (!) 97.1 F (36.2 C)   TempSrc:   Oral   SpO2: 100% 100%  100%  Weight: 43.8 kg (96 lb 9 oz)     Height:        Intake/Output Summary (Last 24 hours) at 01/11/2018 1135 Last data filed at 01/11/2018 0500 Gross per 24 hour  Intake -  Output 3300 ml  Net -3300 ml   Filed Weights   01/09/18 0500 01/10/18 0500 01/11/18 0500  Weight: 46.4 kg (102 lb 4.7 oz) 43.6 kg (96 lb 1.9 oz) 43.8 kg (96 lb 9 oz)    Examination:  General exam: Appears calm and comfortable  Respiratory system: Clear to auscultation. Respiratory effort normal. Cardiovascular system: S1 & S2 heard, RRR. No JVD, murmurs, rubs, gallops or clicks. No pedal edema. Gastrointestinal system: Abdomen is nondistended, soft and nontender. No organomegaly or masses felt. Normal bowel sounds heard. Central nervous system: Alert and oriented x2  Extremities: Symmetric  Skin: No  rashes, lesions or ulcers Psychiatry: Flat affect  Data Reviewed: I have personally reviewed following labs and imaging studies  CBC: Recent Labs  Lab 01/04/18 1637  01/06/18 0500 01/07/18 0248 01/08/18 0238 01/09/18 0312 01/11/18 0310  WBC 10.3   < > 5.2 5.9 3.9* 6.3 6.2  NEUTROABS 8.5*  --   --   --   --   --   --   HGB 12.6*   < > 6.4* 9.2* 10.1* 9.3* 8.5*  HCT 38.3*   < > 18.5* 28.8* 32.9* 28.9* 26.2*  MCV 84.5   < > 80.8 84.2 85.0 82.6 80.4  PLT 321   < > 139* 125* 63* 49* 26*   < > = values in this interval not displayed.   Basic Metabolic Panel: Recent Labs  Lab 01/04/18 2016 01/05/18 0450 01/05/18 1031 01/05/18  1725 01/06/18 0500 01/07/18 0248 01/08/18 0238 01/08/18 0736 01/09/18 0312 01/10/18 0730 01/10/18 0819 01/11/18 0310  NA 148* 148*  --   --  146* 152* 139 136 132* 127* 128* 130*  K 5.1 5.2*  --   --  4.2 2.9* 3.2* 4.5 3.7 3.9 4.1 4.7  CL 121* 116*  --   --  107 113* 107 107 105 103 104 108  CO2 12* 17*  --   --  26 25 22  20* 16* 15* 16* 14*  GLUCOSE 351* 100*  --   --  118* 92 111* 146* 110* 140* 120* 134*  BUN 122* 132*  --   --  107* 88* 50* 45* 29* 26* 28* 27*  CREATININE 5.33* 5.14*  --   --  4.47* 3.39* 2.30* 2.22* 1.50* 1.20 1.19 1.18  CALCIUM 8.3* 7.8*  --   --  6.1* 7.9* 7.7* 7.7* 8.0* 8.0* 8.0* 7.9*  MG 2.7* 2.3 2.2 2.2 1.8 1.8 1.3*  --  1.5* 1.6*  --  2.0  PHOS 9.6* 7.2* 6.2* 5.3* 4.7*  --   --   --   --   --   --   --    GFR: Estimated Creatinine Clearance: 42.3 mL/min (by C-G formula based on SCr of 1.18 mg/dL). Liver Function Tests: Recent Labs  Lab 01/04/18 1637 01/06/18 0500 01/08/18 0736 01/10/18 0819  AST 35 759* 290* 84*  ALT 19 502* 432* 217*  ALKPHOS 129* 83 114 152*  BILITOT 0.7 0.7 0.7 1.1  PROT 9.9* 5.9* 6.8 7.5  ALBUMIN 3.0* 1.8* 2.1* 2.3*   No results for input(s): LIPASE, AMYLASE in the last 168 hours. No results for input(s): AMMONIA in the last 168 hours. Coagulation Profile: Recent Labs  Lab  01/04/18 1637  INR 1.31   Cardiac Enzymes: Recent Labs  Lab 01/04/18 1637  TROPONINI 0.05*   BNP (last 3 results) No results for input(s): PROBNP in the last 8760 hours. HbA1C: No results for input(s): HGBA1C in the last 72 hours. CBG: Recent Labs  Lab 01/10/18 1725 01/10/18 1931 01/11/18 0009 01/11/18 0316 01/11/18 0741  GLUCAP 98 172* 91 127* 99   Lipid Profile: No results for input(s): CHOL, HDL, LDLCALC, TRIG, CHOLHDL, LDLDIRECT in the last 72 hours. Thyroid Function Tests: No results for input(s): TSH, T4TOTAL, FREET4, T3FREE, THYROIDAB in the last 72 hours. Anemia Panel: No results for input(s): VITAMINB12, FOLATE, FERRITIN, TIBC, IRON, RETICCTPCT in the last 72 hours. Sepsis Labs: Recent Labs  Lab 01/04/18 1646 01/04/18 1854 01/04/18 2016 01/05/18 0450 01/06/18 0500  PROCALCITON  --   --  3.68 5.43 6.19  LATICACIDVEN 9.16* 3.82* 3.2* 1.9  --     Recent Results (from the past 240 hour(s))  Culture, blood (Routine x 2)     Status: None   Collection Time: 01/04/18  3:53 PM  Result Value Ref Range Status   Specimen Description   Final    BLOOD RIGHT ARM Performed at [facility], [address], [place], [place] [address]    Special Requests   Final    IN PEDIATRIC BOTTLE Blood Culture adequate volume Performed at [facility], [address], [place], [place] [address]    Culture   Final    NO GROWTH 5 DAYS Performed at [facility], [address] [place], [place] [address]    Report Status 01/09/2018 FINAL  Final  Respiratory Panel by PCR     Status:  None   Collection Time: 01/04/18  8:20 PM  Result Value Ref Range Status   Adenovirus NOT DETECTED NOT DETECTED Final   Coronavirus 229E NOT DETECTED NOT DETECTED Final   Coronavirus HKU1 NOT DETECTED NOT DETECTED Final   Coronavirus NL63 NOT DETECTED NOT DETECTED Final   Coronavirus OC43 NOT DETECTED NOT DETECTED Final   Metapneumovirus NOT DETECTED NOT  DETECTED Final   Rhinovirus / Enterovirus NOT DETECTED NOT DETECTED Final   Influenza A NOT DETECTED NOT DETECTED Final   Influenza B NOT DETECTED NOT DETECTED Final   Parainfluenza Virus 1 NOT DETECTED NOT DETECTED Final   Parainfluenza Virus 2 NOT DETECTED NOT DETECTED Final   Parainfluenza Virus 3 NOT DETECTED NOT DETECTED Final   Parainfluenza Virus 4 NOT DETECTED NOT DETECTED Final   Respiratory Syncytial Virus NOT DETECTED NOT DETECTED Final   Bordetella pertussis NOT DETECTED NOT DETECTED Final   Chlamydophila pneumoniae NOT DETECTED NOT DETECTED Final   Mycoplasma pneumoniae NOT DETECTED NOT DETECTED Final  Culture, Urine     Status: Abnormal   Collection Time: 01/04/18  8:20 PM  Result Value Ref Range Status   Specimen Description   Final    URINE, CATHETERIZED Performed at [facility], [address] [place], [place] [address]    Special Requests   Final    NONE Performed at [facility], [address], [place], [place] [address]    Culture MULTIPLE SPECIES PRESENT, SUGGEST RECOLLECTION (A)  Final   Report Status 01/06/2018 FINAL  Final  MRSA PCR Screening     Status: None   Collection Time: 01/04/18  8:21 PM  Result Value Ref Range Status   MRSA by PCR NEGATIVE NEGATIVE Final    Comment:        The GeneXpert MRSA Assay (FDA approved for NASAL specimens only), is one component of a comprehensive MRSA colonization surveillance program. It is not intended to diagnose MRSA infection nor to guide or monitor treatment for MRSA infections. Performed at [facility], [address], [place], [place] [address]   Culture, blood (Routine x 2)     Status: None   Collection Time: 01/05/18  3:25 AM  Result Value Ref Range Status   Specimen Description   Final    BLOOD LEFT ANTECUBITAL Performed at [facility], [address], [place], [place] [address]    Special Requests   Final    BOTTLES  DRAWN AEROBIC AND ANAEROBIC Blood Culture adequate volume Performed at [facility], [address] [place], [place] [address]    Culture   Final    NO GROWTH 5 DAYS Performed at [facility], [address], [place], [place] [address]    Report Status 01/10/2018 FINAL  Final  CSF culture     Status: None   Collection Time: 01/06/18  1:29 PM  Result Value Ref Range Status   Specimen Description   Final    CSF Performed at [facility], [address], [place], [place] [address]    Special Requests   Final    NONE Performed at [facility], [address], [place], [place] [address]    Gram Stain   Final    WBC PRESENT, PREDOMINANTLY MONONUCLEAR YEAST PRESENT CYTOSPIN SMEAR CRITICAL RESULT CALLED TO, READ BACK BY AND VERIFIED WITH: Alison Stalling. RN @1930  ON 02.20.19 BY COHEN,K Performed at [facility], [address]  [place], [place] [address]    Culture   Final    NO GROWTH 3 DAYS Performed at [facility], [address] [place], [place] [address]    Report Status 01/10/2018 FINAL  Final  Fungus Culture With Stain     Status: None (Preliminary result)   Collection Time: 01/06/18  1:29 PM  Result Value Ref Range Status   Fungus Stain Final report  Final    Comment: (NOTE) Performed At: [facility] [address] [place], [place] [id number] Jolene Schimke MD QI:[id number]    Fungus (Mycology) Culture PENDING  Incomplete   Fungal Source CSF  Final    Comment: Performed at [facility], [address], [place], [place] [address]  Fungus Culture Result     Status: None   Collection Time: 01/06/18  1:29 PM  Result Value Ref Range Status   Result 1 Comment  Final    Comment: (NOTE) KOH/Calcofluor preparation:  no fungus observed. Performed At: [facility] [address] [place], [place] [id number] Jolene Schimke MD [id number] Performed at [facility], [address] [place], [place] [address]   Urine Culture     Status: None   Collection Time: 01/06/18  3:29 PM  Result Value Ref Range Status   Specimen Description   Final    URINE, CATHETERIZED Performed at [facility], [address] [place], [place] [address]    Special Requests   Final    NONE Performed at [facility], [address] [place], [place] [address]    Culture   Final    NO GROWTH Performed at [facility], [address] [place], [place] [address]    Report Status 01/08/2018 FINAL  Final       Radiology Studies: No results found.    Scheduled Meds: . azithromycin  1,200 mg Oral Weekly  . chlorhexidine  15 mL Mouth Rinse BID  . dextrose  10 mL Intravenous Q24H  . dextrose  10 mL Intravenous Q24H  . feeding supplement (ENSURE ENLIVE)  237 mL Oral TID  . fluconazole  400 mg Oral Daily  . insulin aspart  1-3 Units Subcutaneous Q4H  . mouth rinse  15 mL Mouth Rinse q12n4p  . megestrol  400 mg Oral Daily  . metoprolol tartrate  25 mg Oral BID  . multivitamin with minerals  1 tablet Oral Daily  . pantoprazole  40 mg Oral Daily  . sulfamethoxazole-trimethoprim  1 tablet Oral Once per day on Mon Wed Fri   Continuous Infusions: . sodium chloride    . sodium chloride Stopped (01/10/18 1800)  . sodium chloride 500 mL (01/10/18 2024)     LOS: 7 days    Time spent: 40 minutes   Noralee Stain, DO [facility] [email or web address] Password [facility] 01/11/2018, 11:35 AM
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
## 2018-01-11 NOTE — Progress Notes (Signed)
Physical Therapy Treatment Patient Details Name: Kenneth Rivas MRN: [id number] DOB: 01-19-1961 Today's Date: 01/11/2018    History of Present Illness Kenneth Reedylonzo Conrow is a 57 y.o. 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  SNF was recommended on discharge but pt refused.  He went to live at a boarding house with a roommmate and apparently stopped taking his meds.  Home health nurses visited him on Friday 2/15 and recommended that he come to the ED for evaluation due to generalized weakness but he refused.  On 2/18, case worker went to pickup pt for an appointment and found him with profound weakness and AMS.  [facility] for [facility] evaluation and management.  Patient intubated 2/18-2/20.    PT Comments    Patient progressing this session with sitting balance, standing and able to take steps with assist with RW.  Remains very weak, and unsafe needing motivation for OOB today.  Will need SNF level rehab at d/c.   Follow Up Recommendations  SNF;Supervision/Assistance - 24 hour     Equipment Recommendations  Other (comment)(TBA)    Recommendations for Other Services       Precautions / Restrictions Precautions Precautions: Fall Restrictions Weight Bearing Restrictions: No    Mobility  Bed Mobility Overal bed mobility: Needs Assistance Bed Mobility: Rolling;Supine to Sit Rolling: Mod assist   Supine to sit: Mod assist;HOB elevated     General bed mobility comments: encouragement to participate, assist for legs off bed and to lift trunk upright  Transfers Overall transfer level: Needs assistance Equipment used: Rolling walker (2 wheeled) Transfers: Sit to/from Stand Sit to Stand: Mod assist;+2 physical assistance         General  transfer comment: lifting assist from EOB, assist for balance, donned brief in standing due to fecal incontinence in bed,  mild posterior bias fixing legs against bed, but improved with stepping  Ambulation/Gait Ambulation/Gait assistance: Mod assist;+2 safety/equipment Ambulation Distance (Feet): 8 Feet Assistive device: Rolling walker (2 wheeled) Gait Pattern/deviations: Step-to pattern;Shuffle;Decreased stride length;Wide base of support;Leaning posteriorly     General Gait Details: still some posterior bias, assist for anterior weight shift to encourage forward progression with chair following   Stairs            Wheelchair Mobility    Modified Rankin (Stroke Patients Only)       Balance Overall balance assessment: Needs assistance Sitting-balance support: Feet supported Sitting balance-Leahy Scale: Fair Sitting balance - Comments: able to sit EOB unsupported   Standing balance support: Bilateral upper extremity supported Standing balance-Leahy Scale: Poor Standing balance comment: leaning posterior with walker                            Cognition Arousal/Alertness: Awake/alert Behavior During Therapy: WFL for tasks assessed/performed Overall Cognitive Status: No family/caregiver present to determine baseline cognitive functioning                         Following Commands: Follows one step commands inconsistently;Follows one step commands with increased time       General Comments: HOH, requires many cues for safety      Exercises      General Comments General  comments (skin integrity, edema, etc.): fecal incontinence in bed, RN in to assist wtih turning and cleaning      Pertinent Vitals/Pain Pain Assessment: Faces Faces Pain Scale: Hurts even more Pain Location: sore around rectum during cleaning Pain Descriptors / Indicators: Discomfort;Sore;Tender Pain Intervention(s): Monitored during session;Repositioned    Home Living                       Prior Function            PT Goals (current goals can now be found in the care plan section) Progress towards PT goals: Progressing toward goals    Frequency    Min 3X/week      PT Plan Current plan remains appropriate    Co-evaluation              AM-PAC PT "6 Clicks" Daily Activity  Outcome Measure  Difficulty turning over in bed (including adjusting bedclothes, sheets and blankets)?: Unable Difficulty moving from lying on back to sitting on the side of the bed? : Unable Difficulty sitting down on and standing up from a chair with arms (e.g., wheelchair, bedside commode, etc,.)?: Unable Help needed moving to and from a bed to chair (including a wheelchair)?: A Lot Help needed walking in hospital room?: A Lot Help needed climbing 3-5 steps with a railing? : Total 6 Click Score: 8    End of Session Equipment Utilized During Treatment: Gait belt Activity Tolerance: Patient limited by fatigue Patient left: in chair;with call bell/phone within reach;with chair alarm set   PT Visit Diagnosis: Other abnormalities of gait and mobility (R26.89);Muscle weakness (generalized) (M62.81);Other symptoms and signs involving the nervous system (R29.898)     Time: 2956-2130 PT Time Calculation (min) (ACUTE ONLY): 37 min  Charges:  $Gait Training: 8-22 mins $Therapeutic Activity: 8-22 mins                    G CodesSheran Lawless, [place] [phone] 01/11/2018    Elray Mcgregor 01/11/2018, 11:21 AM

## 2018-01-11 NOTE — Progress Notes (Signed)
Patient ID: Kenneth Rivas, male   DOB: 06-09-1961, 57 y.o.   MRN: [id number]          [facility]  Date of Admission:  01/04/2018           Day 6 amphotericin        Day 6 flucytosine ASSESSMENT: He is doing much better than upon admission.  Repeat CSF culture is negative for cryptococcus.  He has also developed thrombocytopenia which is probably due to flucytosine.  I will switch him back to fluconazole 400 mg daily.  We need to hold off on starting anti-red therapy for about 8 weeks in order to not provoke immune reconstitution inflammatory syndrome.  PLAN: 1. Discontinue amphotericin and flucytosine 2. Start fluconazole 400 mg by mouth daily 3. He needs skilled nursing facility placement  Active Problems:   Septic shock (HCC)   Acute renal insufficiency   AIDS (acquired immune deficiency syndrome) (HCC)   Cryptococcal meningoencephalitis (HCC)   Cachexia associated with AIDS (HCC)   Protein-calorie malnutrition, severe   Acute respiratory failure with hypoxia (HCC)   AKI (acute kidney injury) (HCC)   Scheduled Meds: . azithromycin  1,200 mg Oral Weekly  . chlorhexidine  15 mL Mouth Rinse BID  . dextrose  10 mL Intravenous Q24H  . dextrose  10 mL Intravenous Q24H  . feeding supplement (ENSURE ENLIVE)  237 mL Oral TID  . fluconazole  400 mg Oral Daily  . insulin aspart  1-3 Units Subcutaneous Q4H  . mouth rinse  15 mL Mouth Rinse q12n4p  . megestrol  400 mg Oral Daily  . metoprolol tartrate  25 mg Oral BID  . multivitamin with minerals  1 tablet Oral Daily  . pantoprazole  40 mg Oral Daily  . sulfamethoxazole-trimethoprim  1 tablet Oral Once per day on Mon Wed Fri   Continuous Infusions: . sodium chloride    . sodium chloride Stopped (01/10/18 1800)  . sodium chloride 500 mL (01/10/18 2024)  . dextrose 5 % with KCl 20 mEq / L 20 mEq (01/11/18 0409)   PRN Meds:.sodium chloride, acetaminophen, acetaminophen, diphenhydrAMINE **OR**  diphenhydrAMINE, hydrALAZINE, ondansetron (ZOFRAN) IV   SUBJECTIVE: He denies headache.  Review of Systems: Review of Systems  Unable to perform ROS: Other  Constitutional:       He is currently working with physical therapy.    No Known Allergies  OBJECTIVE: Vitals:   01/11/18 0500 01/11/18 0600 01/11/18 0652 01/11/18 0800  BP: 137/75 (!) 142/80  (!) 139/94  Pulse: 88 79  88  Resp: (!) 24 18  (!) 21  Temp:   (!) 97.1 F (36.2 C)   TempSrc:   Oral   SpO2: 100% 100%  100%  Weight: 96 lb 9 oz (43.8 kg)     Height:       Body mass index is 15.59 kg/m.  Physical Exam  Constitutional:  He is much more alert and interactive.  He is sitting up in a chair working with therapist.  Neurological: He is alert.  Psychiatric: Mood and affect normal.    Lab Results Lab Results  Component Value Date   WBC 6.2 01/11/2018   HGB 8.5 (L) 01/11/2018   HCT 26.2 (L) 01/11/2018   MCV 80.4 01/11/2018   PLT 26 (LL) 01/11/2018    Lab Results  Component Value Date   CREATININE 1.18 01/11/2018   BUN 27 (H) 01/11/2018   NA 130 (L) 01/11/2018   K 4.7 01/11/2018  CL 108 01/11/2018   CO2 14 (L) 01/11/2018    Lab Results  Component Value Date   ALT 217 (H) 01/10/2018   AST 84 (H) 01/10/2018   ALKPHOS 152 (H) 01/10/2018   BILITOT 1.1 01/10/2018     Microbiology: Recent Results (from the past 240 hour(s))  Culture, blood (Routine x 2)     Status: None   Collection Time: 01/04/18  3:53 PM  Result Value Ref Range Status   Specimen Description   Final    BLOOD RIGHT ARM Performed at [facility], [address], [place], [place] [address]    Special Requests   Final    IN PEDIATRIC BOTTLE Blood Culture adequate volume Performed at [facility], [address], [place], [place] [address]    Culture   Final    NO GROWTH 5 DAYS Performed at [facility], [address] [place], [place] [address]    Report Status 01/09/2018 FINAL   Final  Respiratory Panel by PCR     Status: None   Collection Time: 01/04/18  8:20 PM  Result Value Ref Range Status   Adenovirus NOT DETECTED NOT DETECTED Final   Coronavirus 229E NOT DETECTED NOT DETECTED Final   Coronavirus HKU1 NOT DETECTED NOT DETECTED Final   Coronavirus NL63 NOT DETECTED NOT DETECTED Final   Coronavirus OC43 NOT DETECTED NOT DETECTED Final   Metapneumovirus NOT DETECTED NOT DETECTED Final   Rhinovirus / Enterovirus NOT DETECTED NOT DETECTED Final   Influenza A NOT DETECTED NOT DETECTED Final   Influenza B NOT DETECTED NOT DETECTED Final   Parainfluenza Virus 1 NOT DETECTED NOT DETECTED Final   Parainfluenza Virus 2 NOT DETECTED NOT DETECTED Final   Parainfluenza Virus 3 NOT DETECTED NOT DETECTED Final   Parainfluenza Virus 4 NOT DETECTED NOT DETECTED Final   Respiratory Syncytial Virus NOT DETECTED NOT DETECTED Final   Bordetella pertussis NOT DETECTED NOT DETECTED Final   Chlamydophila pneumoniae NOT DETECTED NOT DETECTED Final   Mycoplasma pneumoniae NOT DETECTED NOT DETECTED Final  Culture, Urine     Status: Abnormal   Collection Time: 01/04/18  8:20 PM  Result Value Ref Range Status   Specimen Description   Final    URINE, CATHETERIZED Performed at [facility], [address] [place], [place] [address]    Special Requests   Final    NONE Performed at [facility], [address], [place], [place] [address]    Culture MULTIPLE SPECIES PRESENT, SUGGEST RECOLLECTION (A)  Final   Report Status 01/06/2018 FINAL  Final  MRSA PCR Screening     Status: None   Collection Time: 01/04/18  8:21 PM  Result Value Ref Range Status   MRSA by PCR NEGATIVE NEGATIVE Final    Comment:        The GeneXpert MRSA Assay (FDA approved for NASAL specimens only), is one component of a comprehensive MRSA colonization surveillance program. It is not intended to diagnose MRSA infection nor to guide or monitor treatment for MRSA  infections. Performed at [facility], [address] [place], [place] [address]   Culture, blood (Routine x 2)     Status: None   Collection Time: 01/05/18  3:25 AM  Result Value Ref Range Status   Specimen Description   Final    BLOOD LEFT ANTECUBITAL Performed at [facility], [address], [place], [place] [address]    Special Requests  Final    BOTTLES DRAWN AEROBIC AND ANAEROBIC Blood Culture adequate volume Performed at [facility], [address] [place], [place] [address]    Culture   Final    NO GROWTH 5 DAYS Performed at [facility], [address] [place], [place] [address]    Report Status 01/10/2018 FINAL  Final  CSF culture     Status: None   Collection Time: 01/06/18  1:29 PM  Result Value Ref Range Status   Specimen Description   Final    CSF Performed at [facility], [address] [place], [place] [address]    Special Requests   Final    NONE Performed at [facility], [address] [place], [place] [address]    Gram Stain   Final    WBC PRESENT, PREDOMINANTLY MONONUCLEAR YEAST PRESENT CYTOSPIN SMEAR CRITICAL RESULT CALLED TO, READ BACK BY AND VERIFIED WITH: Alison StallingWILLING,K. RN @1930  ON 02.20.19 BY COHEN,K Performed at [facility], [address] [place], [place] [address]    Culture   Final    NO GROWTH 3 DAYS Performed at [facility], [address] [place], [place] [address]    Report Status 01/10/2018 FINAL  Final  Fungus Culture With Stain     Status: None (Preliminary result)   Collection Time: 01/06/18  1:29 PM  Result Value Ref Range Status   Fungus Stain Final report  Final    Comment: (NOTE) Performed At: [facility] [address] [place], [place] [id number] Jolene SchimkeNagendra Sanjai MD KG:[id number]:[phone]    Fungus (Mycology) Culture PENDING  Incomplete   Fungal Source CSF  Final    Comment: Performed at  [facility], [address] [place], [place] [address]  Fungus Culture Result     Status: None   Collection Time: 01/06/18  1:29 PM  Result Value Ref Range Status   Result 1 Comment  Final    Comment: (NOTE) KOH/Calcofluor preparation:  no fungus observed. Performed At: [facility] [address] [place], [place] [id number] Jolene SchimkeNagendra Sanjai MD OV:[id number]:[phone] Performed at [facility], [address] [place], [place] [address]   Urine Culture     Status: None   Collection Time: 01/06/18  3:29 PM  Result Value Ref Range Status   Specimen Description   Final    URINE, CATHETERIZED Performed at [facility], [address] [place], [place] [address]    Special Requests   Final    NONE Performed at [facility], [address] [place], [place] [address]    Culture   Final    NO GROWTH Performed at [facility], [address] [place], [place] [address]    Report Status 01/08/2018 FINAL  Final    Cliffton AstersJohn Fredrica Capano, MD [facility] [phone] pager   [phone] 01/11/2018, 11:25 AM

## 2018-01-11 NOTE — Progress Notes (Signed)
  Speech Language Pathology Treatment: Dysphagia  Patient Details Name: Kenneth Rivas MRN: [id number] DOB: Mar 02, 1961 Today's Date: 01/11/2018 Time: [id number] SLP Time Calculation (min) (ACUTE ONLY): 9 min  Assessment / Plan / Recommendation Clinical Impression  SLP provided skilled observation during breakfast meal, with pt self-feeding Dys 2 and Dys 1 textures and thin liquids. He has prolonged mastication and needed Min cues to clear oral cavity prior to taking additional bites. SLP provided Min cues for additional swallows to clear oral residue, and pt requested a liquid wash with Mod I. His endurance is reduced but he takes rest breaks intermittently with Mod I. Recommend to continue with current diet textures to facilitate energy conservation and oral clearance. SLP will f/u further to assess for tolerance and potential to advance if his endurance improves. He does not give me clear information about his prior diet level though, and when he was previously seen by speech he was on this current diet level so this may not be far from baseline.   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 Intubation 2/18-2/20 patient is intubated and sedated. CXR No acute pulmonary disease identified. MBS 06/28/17 acute, continue NPO and repeat MBS 8/17 with improvements, pharyngeal phase normal except for premature spill, no penetration or aspiration and D2, thin recommended.       SLP Plan  Continue with current plan of care       Recommendations  Diet recommendations: Dysphagia 2 (fine chop);Thin liquid Liquids provided via: Cup;Straw Medication Administration: Whole meds with liquid Supervision: Patient able to self feed;Intermittent supervision to cue for compensatory strategies Compensations: Minimize environmental distractions;Slow rate;Small  sips/bites Postural Changes and/or Swallow Maneuvers: Seated upright 90 degrees;Upright 30-60 min after meal                Oral Care Recommendations: Oral care BID Follow up Recommendations: None SLP Visit Diagnosis: Dysphagia, unspecified (R13.10) Plan: Continue with current plan of care       GO                Kenneth Hamaiewonsky, Kenneth Rivas 01/11/2018, 10:18 AM  Kenneth HamLaura Rivas, M.A. CCC-SLP [phone]

## 2018-01-12 LAB — MAGNESIUM: Magnesium: 1.8 mg/dL (ref 1.7–2.4)

## 2018-01-12 LAB — BASIC METABOLIC PANEL
ANION GAP: 8 (ref 5–15)
BUN: 34 mg/dL — ABNORMAL HIGH (ref 6–20)
CO2: 14 mmol/L — ABNORMAL LOW (ref 22–32)
Calcium: 8.4 mg/dL — ABNORMAL LOW (ref 8.9–10.3)
Chloride: 108 mmol/L (ref 101–111)
Creatinine, Ser: 1.55 mg/dL — ABNORMAL HIGH (ref 0.61–1.24)
GFR calc non Af Amer: 48 mL/min — ABNORMAL LOW (ref >60)
GFR, EST AFRICAN AMERICAN: 55 mL/min — AB (ref >60)
GLUCOSE: 88 mg/dL (ref 65–99)
POTASSIUM: 5 mmol/L (ref 3.5–5.1)
Sodium: 130 mmol/L — ABNORMAL LOW (ref 135–145)

## 2018-01-12 LAB — CBC
HCT: 25.2 % — ABNORMAL LOW (ref 39.0–52.0)
HEMOGLOBIN: 8.4 g/dL — AB (ref 13.0–17.0)
MCH: 26.8 pg (ref 26.0–34.0)
MCHC: 33.3 g/dL (ref 30.0–36.0)
MCV: 80.5 fL (ref 78.0–100.0)
Platelets: 45 10*3/uL — ABNORMAL LOW (ref 150–400)
RBC: 3.13 MIL/uL — AB (ref 4.22–5.81)
RDW: 17.1 % — ABNORMAL HIGH (ref 11.5–15.5)
WBC: 5.9 10*3/uL (ref 4.0–10.5)

## 2018-01-12 LAB — GLUCOSE, CAPILLARY
GLUCOSE-CAPILLARY: 106 mg/dL — AB (ref 65–99)
GLUCOSE-CAPILLARY: 110 mg/dL — AB (ref 65–99)
GLUCOSE-CAPILLARY: 99 mg/dL (ref 65–99)
Glucose-Capillary: 91 mg/dL (ref 65–99)
Glucose-Capillary: 94 mg/dL (ref 65–99)
Glucose-Capillary: 142 mg/dL — ABNORMAL HIGH (ref 65–99)

## 2018-01-12 MED ORDER — SODIUM CHLORIDE 0.9 % IV SOLN
INTRAVENOUS | Status: DC
  Administered 2018-01-12 – 2018-01-14 (×6): via INTRAVENOUS

## 2018-01-12 MED ORDER — SODIUM CHLORIDE 0.9 % IV SOLN
INTRAVENOUS | Status: DC
  Administered 2018-01-12: 08:00:00 via INTRAVENOUS

## 2018-01-12 NOTE — Progress Notes (Addendum)
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 Previously deemed to be without capacity by psych on 11/26/2017 and was suppose to go to SNF but went home with his roommate instead. After discharge, he had stopped taking his medications. He presented to the ED for altered mental status, inability to protect airway and was subsequently intubated on 2/18 and extubated 2/20. Patient was treated for severe persistent cryptococcal meningoencephalitis with amphotericin and 5 flucytosine and now deescalated to fluconazole.   Assessment & Plan:   Active Problems:   Cachexia associated with AIDS (HCC)   Protein-calorie malnutrition, severe   AIDS (acquired immune deficiency syndrome) (HCC)   Cryptococcal meningoencephalitis (HCC)   Acute respiratory failure with hypoxia (HCC)   Septic shock (HCC)   Acute renal insufficiency   AKI (acute kidney injury) (HCC)   Pressure injury of skin   Severe persistent cryptococcal meningoencephalitis  -Persistent despite having undergone 2 rounds of induction chemotherapy over the past 6 months. Repeat spinal fluid shows yeast on stain which may reflect active infection or nonviable yeast following recent treatment.Infectious disease started treatment with amphotericin and 5 flucytosine and now deescalated to fluconazole 400mg  daily   AIDS -Infectious disease recommends holding off on starting antiretroviral therapy until he has gone through repeat induction and at least 8 weeks of consolidation therapy to avoid immune reconstitution inflammatory syndrome. Continue Bactrim DS 1 tablet Mondays Wednesdays and Fridays  for PCP prophylaxis, continue azithromycin 1200 mg weekly for prophylaxis.  AKI -Cr 1.18 --> 1.55 overnight. IVF started. Trend BMP  HTN -Continue metoprolol 25 mg twice daily -Stable   Persistent thrombocytopenia -Likely related to flucytosine. No bleeding reported. -Monitor platelets -Stable today   Severe protein caloric malnutrition -Dietitian consult   DVT prophylaxis: SCD Code Status: Full Family Communication: Sister over the phone 2/25  Disposition Plan: SNF placement pending    Consultants:   PCCM  ID    Antimicrobials:  Anti-infectives (From admission, onward)   Start     Dose/Rate Route Frequency Ordered Stop   01/11/18 1800  fluconazole (DIFLUCAN) tablet 400 mg     400 mg Oral Daily 01/11/18 1057     01/08/18 1600  azithromycin (ZITHROMAX) tablet 1,200 mg     1,200 mg Oral Weekly 01/08/18 1420     01/06/18 1800  flucytosine (ANCOBON) capsule 1,250 mg  Status:  Discontinued     25 mg/kg  48.9 kg Oral Every 6 hours 01/06/18 1430 01/06/18 1454   01/06/18 1800  flucytosine (ANCOBON) capsule 1,250 mg  Status:  Discontinued     25 mg/kg  48.9 kg Oral Every 6 hours 01/06/18 1454 01/11/18 1057   01/06/18 1700  amphotericin B liposome (AMBISOME) 200 mg in dextrose 5 % 500 mL IVPB  Status:  Discontinued     4 mg/kg  48.9 kg 250 mL/hr over 120 Minutes Intravenous Every 24 hours 01/06/18 1430 01/11/18 1057   01/06/18 0900  sulfamethoxazole-trimethoprim (BACTRIM DS,SEPTRA DS) 800-160 MG per tablet 1 tablet     1 tablet Oral Once per day on Mon Wed Fri 01/05/18 1438     01/05/18 1800  azithromycin (ZITHROMAX) 200 MG/5ML  suspension 1,200 mg  Status:  Discontinued     1,200 mg Per Tube Weekly 01/05/18 1717 01/08/18 1420   01/05/18 1600  azithromycin (ZITHROMAX) tablet 1,200 mg  Status:  Discontinued     1,200 mg Oral Weekly 01/05/18 1438 01/05/18 1717   01/05/18 1200  fluconazole (DIFLUCAN) 40 MG/ML suspension 400 mg  Status:  Discontinued     400 mg Per Tube Daily  01/05/18 1057 01/06/18 1012   01/05/18 0200  piperacillin-tazobactam (ZOSYN) IVPB 2.25 g  Status:  Discontinued     2.25 g 100 mL/hr over 30 Minutes Intravenous Every 8 hours 01/04/18 2044 01/07/18 1650   01/04/18 2015  piperacillin-tazobactam (ZOSYN) IVPB 3.375 g  Status:  Discontinued     3.375 g 100 mL/hr over 30 Minutes Intravenous  Once 01/04/18 2000 01/04/18 2005   01/04/18 2015  vancomycin (VANCOCIN) IVPB 1000 mg/200 mL premix  Status:  Discontinued     1,000 mg 200 mL/hr over 60 Minutes Intravenous  Once 01/04/18 2000 01/04/18 2005   01/04/18 1800  fluconazole (DIFLUCAN) 40 MG/ML suspension 400 mg     400 mg Per Tube  Once 01/04/18 1750 01/04/18 1949   01/04/18 1615  vancomycin (VANCOCIN) IVPB 1000 mg/200 mL premix     1,000 mg 200 mL/hr over 60 Minutes Intravenous  Once 01/04/18 1603 01/04/18 1914   01/04/18 1615  piperacillin-tazobactam (ZOSYN) IVPB 3.375 g     3.375 g 100 mL/hr over 30 Minutes Intravenous  Once 01/04/18 1603 01/04/18 1813       Subjective: No new complaints. Wants oatmeal and ice cream. No complaints of pain.   Objective: Vitals:   01/11/18 1140 01/11/18 2036 01/11/18 2238 01/12/18 0446  BP: (!) 124/92 114/74 121/72 111/77  Pulse: 92 97 86 (!) 107  Resp: 16 (!) 22  20  Temp: 98 F (36.7 C) 98.2 F (36.8 C)  97.7 F (36.5 C)  TempSrc: Other (Comment) Oral  Oral  SpO2: 100% 100%  100%  Weight:    42.9 kg (94 lb 9.2 oz)  Height:        Intake/Output Summary (Last 24 hours) at 01/12/2018 1223 Last data filed at 01/12/2018 0600 Gross per 24 hour  Intake 0 ml  Output -  Net 0 ml   Filed Weights   01/10/18 0500 01/11/18 0500 01/12/18 0446  Weight: 43.6 kg (96 lb 1.9 oz) 43.8 kg (96 lb 9 oz) 42.9 kg (94 lb 9.2 oz)    Examination: General exam: Appears calm and comfortable  Respiratory system: Clear to auscultation. Respiratory effort normal. Cardiovascular system: S1 & S2 heard, RRR. No JVD, murmurs, rubs, gallops or clicks. No pedal  edema. Gastrointestinal system: Abdomen is nondistended, soft and nontender. No organomegaly or masses felt. Normal bowel sounds heard. Central nervous system: Alert and oriented x2. No focal neurological deficits. Extremities: Symmetric 5 x 5 power. Skin: No rashes, lesions or ulcers Psychiatry: Flat affect    Data Reviewed: I have personally reviewed following labs and imaging studies  CBC: Recent Labs  Lab 01/07/18 0248 01/08/18 0238 01/09/18 0312 01/11/18 0310 01/12/18 0417  WBC 5.9 3.9* 6.3 6.2 5.9  HGB 9.2* 10.1* 9.3* 8.5* 8.4*  HCT 28.8* 32.9* 28.9* 26.2* 25.2*  MCV 84.2 85.0 82.6 80.4 80.5  PLT 125* 63* 49* 26* 45*   Basic Metabolic Panel: Recent Labs  Lab 01/05/18 1725 01/06/18 0500  01/08/18 0238  01/09/18 0312 01/10/18 0730 01/10/18 0819 01/11/18 0310 01/12/18 0417  NA  --  146*   < >  139   < > 132* 127* 128* 130* 130*  K  --  4.2   < > 3.2*   < > 3.7 3.9 4.1 4.7 5.0  CL  --  107   < > 107   < > 105 103 104 108 108  CO2  --  26   < > 22   < > 16* 15* 16* 14* 14*  GLUCOSE  --  118*   < > 111*   < > 110* 140* 120* 134* 88  BUN  --  107*   < > 50*   < > 29* 26* 28* 27* 34*  CREATININE  --  4.47*   < > 2.30*   < > 1.50* 1.20 1.19 1.18 1.55*  CALCIUM  --  6.1*   < > 7.7*   < > 8.0* 8.0* 8.0* 7.9* 8.4*  MG 2.2 1.8   < > 1.3*  --  1.5* 1.6*  --  2.0 1.8  PHOS 5.3* 4.7*  --   --   --   --   --   --   --   --    < > = values in this interval not displayed.   GFR: Estimated Creatinine Clearance: 31.5 mL/min (A) (by C-G formula based on SCr of 1.55 mg/dL (H)). Liver Function Tests: Recent Labs  Lab 01/06/18 0500 01/08/18 0736 01/10/18 0819  AST 759* 290* 84*  ALT 502* 432* 217*  ALKPHOS 83 114 152*  BILITOT 0.7 0.7 1.1  PROT 5.9* 6.8 7.5  ALBUMIN 1.8* 2.1* 2.3*   No results for input(s): LIPASE, AMYLASE in the last 168 hours. No results for input(s): AMMONIA in the last 168 hours. Coagulation Profile: No results for input(s): INR, PROTIME in the last  168 hours. Cardiac Enzymes: No results for input(s): CKTOTAL, CKMB, CKMBINDEX, TROPONINI in the last 168 hours. BNP (last 3 results) No results for input(s): PROBNP in the last 8760 hours. HbA1C: No results for input(s): HGBA1C in the last 72 hours. CBG: Recent Labs  Lab 01/11/18 2040 01/12/18 0039 01/12/18 0452 01/12/18 0754 01/12/18 1149  GLUCAP 91 99 106* 110* 91   Lipid Profile: No results for input(s): CHOL, HDL, LDLCALC, TRIG, CHOLHDL, LDLDIRECT in the last 72 hours. Thyroid Function Tests: No results for input(s): TSH, T4TOTAL, FREET4, T3FREE, THYROIDAB in the last 72 hours. Anemia Panel: No results for input(s): VITAMINB12, FOLATE, FERRITIN, TIBC, IRON, RETICCTPCT in the last 72 hours. Sepsis Labs: Recent Labs  Lab 01/06/18 0500  PROCALCITON 6.19    Recent Results (from the past 240 hour(s))  Culture, blood (Routine x 2)     Status: None   Collection Time: 01/04/18  3:53 PM  Result Value Ref Range Status   Specimen Description   Final    BLOOD RIGHT ARM Performed at [facility], [address] [place], [place] [address]    Special Requests   Final    IN PEDIATRIC BOTTLE Blood Culture adequate volume Performed at [facility], [address] [place], [place] [address]    Culture   Final    NO GROWTH 5 DAYS Performed at [facility], [address] [place], [place] [address]    Report Status 01/09/2018 FINAL  Final  Respiratory Panel by PCR     Status: None   Collection Time: 01/04/18  8:20 PM  Result Value Ref Range Status   Adenovirus NOT DETECTED NOT DETECTED Final   Coronavirus 229E NOT  DETECTED NOT DETECTED Final   Coronavirus HKU1 NOT DETECTED NOT DETECTED Final   Coronavirus NL63 NOT DETECTED NOT DETECTED Final   Coronavirus OC43 NOT DETECTED NOT DETECTED Final   Metapneumovirus NOT DETECTED NOT DETECTED Final   Rhinovirus / Enterovirus NOT DETECTED NOT DETECTED Final   Influenza A NOT DETECTED NOT  DETECTED Final   Influenza B NOT DETECTED NOT DETECTED Final   Parainfluenza Virus 1 NOT DETECTED NOT DETECTED Final   Parainfluenza Virus 2 NOT DETECTED NOT DETECTED Final   Parainfluenza Virus 3 NOT DETECTED NOT DETECTED Final   Parainfluenza Virus 4 NOT DETECTED NOT DETECTED Final   Respiratory Syncytial Virus NOT DETECTED NOT DETECTED Final   Bordetella pertussis NOT DETECTED NOT DETECTED Final   Chlamydophila pneumoniae NOT DETECTED NOT DETECTED Final   Mycoplasma pneumoniae NOT DETECTED NOT DETECTED Final  Culture, Urine     Status: Abnormal   Collection Time: 01/04/18  8:20 PM  Result Value Ref Range Status   Specimen Description   Final    URINE, CATHETERIZED Performed at [facility], [address] [place], [place] [address]    Special Requests   Final    NONE Performed at [facility], [address], [place], [place] [address]    Culture MULTIPLE SPECIES PRESENT, SUGGEST RECOLLECTION (A)  Final   Report Status 01/06/2018 FINAL  Final  MRSA PCR Screening     Status: None   Collection Time: 01/04/18  8:21 PM  Result Value Ref Range Status   MRSA by PCR NEGATIVE NEGATIVE Final    Comment:        The GeneXpert MRSA Assay (FDA approved for NASAL specimens only), is one component of a comprehensive MRSA colonization surveillance program. It is not intended to diagnose MRSA infection nor to guide or monitor treatment for MRSA infections. Performed at [facility], [address] [place], [place] [address]   Culture, blood (Routine x 2)     Status: None   Collection Time: 01/05/18  3:25 AM  Result Value Ref Range Status   Specimen Description   Final    BLOOD LEFT ANTECUBITAL Performed at [facility], [address], [place], [place] [address]    Special Requests   Final    BOTTLES DRAWN AEROBIC AND ANAEROBIC Blood Culture adequate volume Performed at [facility], [address] [place], [place] [address]    Culture   Final    NO GROWTH 5 DAYS Performed at [facility], [address] [place], [place] [address]    Report Status 01/10/2018 FINAL  Final  CSF culture     Status: None   Collection Time: 01/06/18  1:29 PM  Result Value Ref Range Status   Specimen Description   Final    CSF Performed at [facility], [address], [place], [place] [address]    Special Requests   Final    NONE Performed at [facility], [address], [place], [place] [address]    Gram Stain   Final    WBC PRESENT, PREDOMINANTLY MONONUCLEAR YEAST PRESENT CYTOSPIN SMEAR CRITICAL RESULT CALLED TO, READ BACK BY AND VERIFIED WITH: Alison Stalling. RN @1930  ON 02.20.19 BY COHEN,K Performed at [facility], [address] [place], [place] [address]    Culture   Final    NO GROWTH 3 DAYS Performed at [facility], [address], [place],  [place] [address]    Report Status 01/10/2018 FINAL  Final  Fungus Culture With Stain     Status: None (Preliminary result)   Collection Time: 01/06/18  1:29 PM  Result Value Ref Range Status   Fungus Stain Final report  Final    Comment: (NOTE) Performed At: [facility] [address] [place], [place] [address] Jolene Schimke MD [id number]    Fungus (Mycology) Culture PENDING  Incomplete   Fungal Source CSF  Final    Comment: Performed at [facility], [address] [place], [place] [address]  Fungus Culture Result     Status: None   Collection Time: 01/06/18  1:29 PM  Result Value Ref Range Status   Result 1 Comment  Final    Comment: (NOTE) KOH/Calcofluor preparation:  no fungus observed. Performed At: [facility] [address] [place], [place] [id number] Jolene Schimke MD [id number] Performed at [facility], [address] [place], [place] [address]   Urine Culture     Status: None   Collection Time: 01/06/18   3:29 PM  Result Value Ref Range Status   Specimen Description   Final    URINE, CATHETERIZED Performed at [facility], [address], [place], [place] [address]    Special Requests   Final    NONE Performed at [facility], [address] [place], [place] [address]    Culture   Final    NO GROWTH Performed at [facility], [address] [place], [place] [address]    Report Status 01/08/2018 FINAL  Final       Radiology Studies: No results found.    Scheduled Meds: . azithromycin  1,200 mg Oral Weekly  . chlorhexidine  15 mL Mouth Rinse BID  . feeding supplement (ENSURE ENLIVE)  237 mL Oral TID  . fluconazole  400 mg Oral Daily  . insulin aspart  1-3 Units Subcutaneous Q4H  . mouth rinse  15 mL Mouth Rinse q12n4p  . megestrol  400 mg Oral Daily  . metoprolol tartrate  25 mg Oral BID  . multivitamin with minerals  1 tablet Oral Daily  . pantoprazole  40 mg Oral Daily  . sulfamethoxazole-trimethoprim  1 tablet Oral Once per day on Mon Wed Fri   Continuous Infusions: . sodium chloride    . sodium chloride 100 mL/hr at 01/12/18 0805     LOS: 8 days    Time spent: 30 minutes   Noralee Stain, DO [facility] [email or web address] Password [facility] 01/12/2018, 12:23 PM

## 2018-01-12 NOTE — [place] FL2 (Signed)
[place] MEDICAID FL2 LEVEL OF CARE SCREENING TOOL     IDENTIFICATION  Patient Name: Kenneth Rivas Birthdate: 1960-12-07 Sex: male Admission Date (Current Location): 01/04/2018  [place]  [place] [id number] O Facility and Address:  [facility],  [address], [place] [id number]      Provider Number: [id number]  Attending Physician Name and Address:  Noralee Stainhoi, Jennifer, DO  Relative Name and Phone Number:  Eilleen Kempfheresa Moore (sister) [phone]    Current Level of Care: Hospital Recommended Level of Care: Skilled Nursing Facility Prior Approval Number:    Date Approved/Denied:   PASRR Number: [id number]  Discharge Plan: SNF    Current Diagnoses: Patient Active Problem List   Diagnosis Date Noted  . Pressure injury of skin 01/11/2018  . AKI (acute kidney injury) (HCC)   . Acute renal insufficiency 01/05/2018  . Septic shock (HCC) 01/04/2018  . Cardiomyopathy [facility](HCC): Per 2 d echo 11/28/2017 11/29/2017  . Anemia 11/28/2017  . Acute respiratory failure with hypoxia (HCC)   . Cryptococcal meningoencephalitis (HCC)   . Protein-calorie malnutrition, severe 06/17/2017  . AIDS (acquired immune deficiency syndrome) (HCC)   . Weakness 06/16/2017  . Cachexia associated with AIDS (HCC)   . 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 HTN (hypertension) 08/17/2013  . Hepatitis B   . ELEVATED BLOOD PRESSURE 06/12/2010  . TB SKIN TEST, POSITIVE 01/12/2009    Orientation RESPIRATION BLADDER Height & Weight     Self, Situation, Place  Normal Incontinent Weight: 94 lb 9.2 oz (42.9 kg) Height:  5\' 6"  (167.6 cm)  BEHAVIORAL SYMPTOMS/MOOD NEUROLOGICAL BOWEL NUTRITION STATUS      Incontinent Diet(DYS 2)  AMBULATORY STATUS COMMUNICATION OF NEEDS Skin   Extensive Assist Verbally PU Stage and Appropriate Care   PU Stage 2 Dressing:  (PressureInjuryStageII-Partialthicknesslossofdermispresentingasashallowopenulcerwithared,pinkwoundbedwithoutslough.  Location Orientation: Medial;Mid;Right  Buttocks  Foam Dressing Changed PRN)                   Personal Care Assistance Level of Assistance  Bathing, Feeding, Dressing Bathing Assistance: Maximum assistance Feeding assistance: Limited assistance Dressing Assistance: Maximum assistance     Functional Limitations Info  Sight, Hearing, Speech Sight Info: Adequate Hearing Info: Impaired Speech Info: Adequate    SPECIAL CARE FACTORS FREQUENCY  PT (By licensed PT), OT (By licensed OT)     PT Frequency: 5x/week OT Frequency: 5x/week            Contractures Contractures Info: Not present    Additional Factors Info  Code Status, Allergies Code Status Info: Full Code Allergies Info: NKA           Current Medications (01/12/2018):  This is the current hospital active medication list Current Facility-Administered Medications  Medication Dose Route Frequency Provider Last Rate Last Dose  . 0.9 %  sodium chloride infusion  250 mL Intravenous PRN Hammonds, Curt JewsKathleen H, MD      . 0.9 %  sodium chloride infusion   Intravenous Continuous Noralee Stainhoi, Jennifer, DO 100 mL/hr at 01/12/18 0805    . acetaminophen (TYLENOL) tablet 650 mg  650 mg Oral Q4H PRN Hammonds, Curt JewsKathleen H, MD      . acetaminophen (TYLENOL) tablet 650 mg  650 mg Oral Daily PRN Cliffton Astersampbell, [name], MD      . azithromycin [facility](ZITHROMAX) tablet 1,200 mg  1,200 mg Oral Weekly Emokpae, Courage, MD   1,200 mg at 01/08/18 1700  . chlorhexidine (PERIDEX) 0.12 % solution 15 mL  15 mL Mouth Rinse BID Lupita LeashMcQuaid, Douglas B, MD  15 mL at 01/12/18 0951  . diphenhydrAMINE (BENADRYL) injection 25 mg  25 mg Intravenous Daily PRN [name], MD       Or  . diphenhydrAMINE (BENADRYL) capsule 25 mg  25 mg Oral Daily PRN [name], MD      . feeding supplement (ENSURE ENLIVE) (ENSURE ENLIVE) liquid 237 mL   237 mL Oral TID Shon Hale, MD   237 mL at 01/11/18 2045  . fluconazole (DIFLUCAN) tablet 400 mg  400 mg Oral Daily [name], MD   400 mg at 01/12/18 9562  . hydrALAZINE (APRESOLINE) injection 10 mg  10 mg Intravenous Q4H PRN Shon Hale, MD   10 mg at 01/09/18 1205  . insulin aspart (novoLOG) injection 1-3 Units  1-3 Units Subcutaneous Q4H Hammonds, Curt Jews, MD   1 Units at 01/11/18 0410  . MEDLINE mouth rinse  15 mL Mouth Rinse q12n4p Max Fickle B, MD   15 mL at 01/11/18 1300  . megestrol (MEGACE) 400 MG/10ML suspension 400 mg  400 mg Oral Daily Emokpae, Courage, MD   400 mg at 01/12/18 0951  . metoprolol tartrate (LOPRESSOR) tablet 25 mg  25 mg Oral BID Shon Hale, MD   25 mg at 01/12/18 0952  . multivitamin with minerals tablet 1 tablet  1 tablet Oral Daily Shon Hale, MD   1 tablet at 01/12/18 [phone]  . ondansetron (ZOFRAN) injection 4 mg  4 mg Intravenous Q6H PRN Hammonds, Curt Jews, MD      . pantoprazole (PROTONIX) EC tablet 40 mg  40 mg Oral Daily Mariea Clonts, Courage, MD   40 mg at 01/12/18 0952  . sulfamethoxazole-trimethoprim (BACTRIM DS,SEPTRA DS) 800-160 MG per tablet 1 tablet  1 tablet Oral Once per day on Mon Wed Fri [name], MD   1 tablet at 01/11/18 6578     Discharge Medications: Please see discharge summary for a list of discharge medications.  Relevant Imaging Results:  Relevant Lab Results:   Additional Information SSN [id number]  Antionette Poles, LCSW

## 2018-01-12 NOTE — Progress Notes (Signed)
Patient ID: Kenneth Rivas, male   DOB: 05-Aug-1961, 57 y.o.   MRN: [id number]         [facility]  Date of Admission:  01/04/2018           Day 7 antifungal therapy (latest round)        Day 2 fluconazole ASSESSMENT: He is doing much better than upon admission.  Repeat CSF culture is negative for cryptococcus.  His platelet count is coming up off of flucytosine.  I will continue fluconazole 400 mg daily.  We need to hold off on starting anti-red therapy for about 8 weeks in order to not provoke immune reconstitution inflammatory syndrome.  PLAN: 1. Continue fluconazole 400 mg by mouth daily 2. He needs skilled nursing facility placement  Principal Problem:   Cryptococcal meningoencephalitis (HCC) Active Problems:   Septic shock (HCC)   Acute renal insufficiency   AIDS (acquired immune deficiency syndrome) (HCC)   Cachexia associated with AIDS (HCC)   Protein-calorie malnutrition, severe   Acute respiratory failure with hypoxia (HCC)   AKI (acute kidney injury) (HCC)   Pressure injury of skin   Scheduled Meds: . azithromycin  1,200 mg Oral Weekly  . chlorhexidine  15 mL Mouth Rinse BID  . feeding supplement (ENSURE ENLIVE)  237 mL Oral TID  . fluconazole  400 mg Oral Daily  . insulin aspart  1-3 Units Subcutaneous Q4H  . mouth rinse  15 mL Mouth Rinse q12n4p  . megestrol  400 mg Oral Daily  . metoprolol tartrate  25 mg Oral BID  . multivitamin with minerals  1 tablet Oral Daily  . pantoprazole  40 mg Oral Daily  . sulfamethoxazole-trimethoprim  1 tablet Oral Once per day on Mon Wed Fri   Continuous Infusions: . sodium chloride    . sodium chloride 100 mL/hr at 01/12/18 1231   PRN Meds:.sodium chloride, acetaminophen, acetaminophen, diphenhydrAMINE **OR** diphenhydrAMINE, hydrALAZINE, ondansetron (ZOFRAN) IV   SUBJECTIVE: He denies headache.  Review of Systems: Review of Systems  Unable to perform ROS: Mental acuity    No Known  Allergies  OBJECTIVE: Vitals:   01/11/18 2036 01/11/18 2238 01/12/18 0446 01/12/18 1247  BP: 114/74 121/72 111/77 (!) 136/94  Pulse: 97 86 (!) 107 77  Resp: (!) 22  20   Temp: 98.2 F (36.8 C)  97.7 F (36.5 C) 97.9 F (36.6 C)  TempSrc: Oral  Oral Oral  SpO2: 100%  100% 100%  Weight:   94 lb 9.2 oz (42.9 kg)   Height:       Body mass index is 15.27 kg/m.  Physical Exam  Constitutional:  He is resting quietly in bed.  He answers simple questions correctly.  Neurological: He is alert.  Psychiatric: Mood and affect normal.    Lab Results Lab Results  Component Value Date   WBC 5.9 01/12/2018   HGB 8.4 (L) 01/12/2018   HCT 25.2 (L) 01/12/2018   MCV 80.5 01/12/2018   PLT 45 (L) 01/12/2018    Lab Results  Component Value Date   CREATININE 1.55 (H) 01/12/2018   BUN 34 (H) 01/12/2018   NA 130 (L) 01/12/2018   K 5.0 01/12/2018   CL 108 01/12/2018   CO2 14 (L) 01/12/2018    Lab Results  Component Value Date   ALT 217 (H) 01/10/2018   AST 84 (H) 01/10/2018   ALKPHOS 152 (H) 01/10/2018   BILITOT 1.1 01/10/2018     Microbiology: Recent Results (from the  past 240 hour(s))  Culture, blood (Routine x 2)     Status: None   Collection Time: 01/04/18  3:53 PM  Result Value Ref Range Status   Specimen Description   Final    BLOOD RIGHT ARM Performed at [facility], [address], [place], [place] [address]    Special Requests   Final    IN PEDIATRIC BOTTLE Blood Culture adequate volume Performed at [facility], [address] [place], [place] [address]    Culture   Final    NO GROWTH 5 DAYS Performed at [facility], [address], [place], [place] [address]    Report Status 01/09/2018 FINAL  Final  Respiratory Panel by PCR     Status: None   Collection Time: 01/04/18  8:20 PM  Result Value Ref Range Status   Adenovirus NOT DETECTED NOT DETECTED Final   Coronavirus 229E NOT DETECTED NOT DETECTED Final    Coronavirus HKU1 NOT DETECTED NOT DETECTED Final   Coronavirus NL63 NOT DETECTED NOT DETECTED Final   Coronavirus OC43 NOT DETECTED NOT DETECTED Final   Metapneumovirus NOT DETECTED NOT DETECTED Final   Rhinovirus / Enterovirus NOT DETECTED NOT DETECTED Final   Influenza A NOT DETECTED NOT DETECTED Final   Influenza B NOT DETECTED NOT DETECTED Final   Parainfluenza Virus 1 NOT DETECTED NOT DETECTED Final   Parainfluenza Virus 2 NOT DETECTED NOT DETECTED Final   Parainfluenza Virus 3 NOT DETECTED NOT DETECTED Final   Parainfluenza Virus 4 NOT DETECTED NOT DETECTED Final   Respiratory Syncytial Virus NOT DETECTED NOT DETECTED Final   Bordetella pertussis NOT DETECTED NOT DETECTED Final   Chlamydophila pneumoniae NOT DETECTED NOT DETECTED Final   Mycoplasma pneumoniae NOT DETECTED NOT DETECTED Final  Culture, Urine     Status: Abnormal   Collection Time: 01/04/18  8:20 PM  Result Value Ref Range Status   Specimen Description   Final    URINE, CATHETERIZED Performed at [facility], [address], [place], [place] [address]    Special Requests   Final    NONE Performed at [facility], [address] [place], [place] [address]    Culture MULTIPLE SPECIES PRESENT, SUGGEST RECOLLECTION (A)  Final   Report Status 01/06/2018 FINAL  Final  MRSA PCR Screening     Status: None   Collection Time: 01/04/18  8:21 PM  Result Value Ref Range Status   MRSA by PCR NEGATIVE NEGATIVE Final    Comment:        The GeneXpert MRSA Assay (FDA approved for NASAL specimens only), is one component of a comprehensive MRSA colonization surveillance program. It is not intended to diagnose MRSA infection nor to guide or monitor treatment for MRSA infections. Performed at [facility], [address], [place], [place] [address]   Culture, blood (Routine x 2)     Status: None   Collection Time: 01/05/18  3:25 AM  Result Value Ref Range Status    Specimen Description   Final    BLOOD LEFT ANTECUBITAL Performed at [facility], [address], [place], [place] [address]    Special Requests   Final    BOTTLES DRAWN AEROBIC AND ANAEROBIC Blood Culture adequate volume Performed at [facility], [address] [place], [place] [address]    Culture   Final    NO GROWTH 5 DAYS Performed at [facility], [address] [place], [place] [address]    Report Status 01/10/2018 FINAL  Final  CSF culture     Status: None   Collection Time: 01/06/18  1:29 PM  Result Value Ref Range Status   Specimen Description   Final    CSF Performed at [facility], [address], [place], [place] [address]    Special Requests   Final    NONE Performed at [facility], [address], [place], [place] [address]    Gram Stain   Final    WBC PRESENT, PREDOMINANTLY MONONUCLEAR YEAST PRESENT CYTOSPIN SMEAR CRITICAL RESULT CALLED TO, READ BACK BY AND VERIFIED WITH: Alison Stalling. RN @1930  ON 02.20.19 BY COHEN,K Performed at [facility], [address], [place], [place] [address]    Culture   Final    NO GROWTH 3 DAYS Performed at [facility], [address], [place], [place] [address]    Report Status 01/10/2018 FINAL  Final  Fungus Culture With Stain     Status: None (Preliminary result)   Collection Time: 01/06/18  1:29 PM  Result Value Ref Range Status   Fungus Stain Final report  Final    Comment: (NOTE) Performed At: [facility] [address] [place], [place] [id number] Jolene Schimke MD ON:[id number]    Fungus (Mycology) Culture PENDING  Incomplete   Fungal Source CSF  Final    Comment: Performed at [facility], [address], [place], [place] [address]  Fungus Culture Result     Status: None   Collection Time: 01/06/18  1:29 PM  Result Value Ref Range Status   Result 1 Comment  Final    Comment:  (NOTE) KOH/Calcofluor preparation:  no fungus observed. Performed At: [facility] [address] [place], [place] [id number] Jolene Schimke MD IH:[id number] Performed at [facility], [address] [place], [place] [address]   Urine Culture     Status: None   Collection Time: 01/06/18  3:29 PM  Result Value Ref Range Status   Specimen Description   Final    URINE, CATHETERIZED Performed at [facility], [address] [place], [place] [address]    Special Requests   Final    NONE Performed at [facility], [address] [place], [place] [address]    Culture   Final    NO GROWTH Performed at [facility], [address], [place], [place] [address]    Report Status 01/08/2018 FINAL  Final    [name], MD [facility] [phone] pager   [phone] cell 01/12/2018, 4:42 PM

## 2018-01-13 DIAGNOSIS — E43 Unspecified severe protein-calorie malnutrition: Secondary | ICD-10-CM

## 2018-01-13 LAB — BASIC METABOLIC PANEL
Anion gap: 6 (ref 5–15)
BUN: 34 mg/dL — ABNORMAL HIGH (ref 6–20)
CO2: 14 mmol/L — AB (ref 22–32)
Calcium: 8.2 mg/dL — ABNORMAL LOW (ref 8.9–10.3)
Chloride: 114 mmol/L — ABNORMAL HIGH (ref 101–111)
Creatinine, Ser: 1.29 mg/dL — ABNORMAL HIGH (ref 0.61–1.24)
GFR calc Af Amer: >60 mL/min (ref >60)
GFR, EST NON AFRICAN AMERICAN: 60 mL/min — AB (ref >60)
GLUCOSE: 88 mg/dL (ref 65–99)
Potassium: 5.1 mmol/L (ref 3.5–5.1)
Sodium: 134 mmol/L — ABNORMAL LOW (ref 135–145)

## 2018-01-13 LAB — CBC
HEMATOCRIT: 21.1 % — AB (ref 39.0–52.0)
Hemoglobin: 7.1 g/dL — ABNORMAL LOW (ref 13.0–17.0)
MCH: 27.7 pg (ref 26.0–34.0)
MCHC: 33.6 g/dL (ref 30.0–36.0)
MCV: 82.4 fL (ref 78.0–100.0)
Platelets: 72 10*3/uL — ABNORMAL LOW (ref 150–400)
RBC: 2.56 MIL/uL — AB (ref 4.22–5.81)
RDW: 17.8 % — AB (ref 11.5–15.5)
WBC: 5.4 10*3/uL (ref 4.0–10.5)

## 2018-01-13 LAB — GLUCOSE, CAPILLARY
GLUCOSE-CAPILLARY: 122 mg/dL — AB (ref 65–99)
GLUCOSE-CAPILLARY: 80 mg/dL (ref 65–99)
GLUCOSE-CAPILLARY: 151 mg/dL — AB (ref 65–99)
Glucose-Capillary: 103 mg/dL — ABNORMAL HIGH (ref 65–99)
Glucose-Capillary: 72 mg/dL (ref 65–99)
Glucose-Capillary: 76 mg/dL (ref 65–99)

## 2018-01-13 LAB — ACID FAST SMEAR (AFB, MYCOBACTERIA)

## 2018-01-13 LAB — MAGNESIUM: Magnesium: 1.5 mg/dL — ABNORMAL LOW (ref 1.7–2.4)

## 2018-01-13 LAB — PREPARE RBC (CROSSMATCH)

## 2018-01-13 LAB — ACID FAST CULTURE WITH REFLEXED SENSITIVITIES

## 2018-01-13 MED ORDER — SODIUM CHLORIDE 0.9 % IV SOLN
Freq: Once | INTRAVENOUS | Status: AC
  Administered 2018-01-13: 14:00:00 via INTRAVENOUS

## 2018-01-13 MED ORDER — MAGNESIUM SULFATE 2 GM/50ML IV SOLN
2.0000 g | Freq: Once | INTRAVENOUS | Status: AC
  Administered 2018-01-13: 2 g via INTRAVENOUS
  Filled 2018-01-13: qty 50

## 2018-01-13 NOTE — Progress Notes (Signed)
CSW following to assist patient with SNF placement.  Patient currently under review by [facility].   CSW contacted by patient's sister and informed that her daughter went to visit patient and patient reported that he was going home.  CSW/RNCM went to speak with patient about SNF placement. CSW reminded patient of assessment on 2/25 where patient was agreeable to SNF placement. CSW asked patient if he was still agreeable to SNF placement at dc, patient replied yes.   CSW contacted patient's sister and provided update.   CSW will continue to follow and assist patient with discharge planning.  Celso SickleKimberly Jaquayla Hege, [place] Clinical Social Worker [facility] Cell#: [phone]

## 2018-01-13 NOTE — Progress Notes (Signed)
In with CSW to discuss discharge to SNF. Pt agreed with going to SNF twice.

## 2018-01-13 NOTE — Progress Notes (Signed)
Patient ID: Kenneth Rivas, male   DOB: October 04, 1961, 57 y.o.   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  My plan is to keep him on 400 mg of fluconazole daily for 8 more weeks.  At that point fluconazole can be decreased to a maintenance dose of 200 mg daily and he can start antiretroviral therapy.  He needs to continue on prophylactic doses of azithromycin and trimethoprim sulfamethoxazole as well.  I will arrange follow-up in our clinic within the next few weeks.  He needs skilled nursing facility placement.         Kenneth AstersJohn Malissie Musgrave, MD [facility] [phone] pager   [phone] cell 01/13/2018, 4:02 PM

## 2018-01-13 NOTE — Progress Notes (Signed)
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 After discharge, he had stopped taking his medications. He presented to the ED for altered mental status, inability to protect airway and was subsequently intubated on 2/18 and extubated 2/20. Patient was treated for severe persistent cryptococcal meningoencephalitis with amphotericin and 5 flucytosine and now deescalated to fluconazole. ID recommending treating patient with 400 mg of Fluconazole Daily and holding ART in that time and then after 8 weeks decreasing dose to Fluconazole to 200 mg po Daily and starting Antiretroviral Therapy.   Assessment & Plan:   Principal Problem:   Cryptococcal meningoencephalitis (HCC) Active Problems:   Cachexia associated with AIDS (HCC)   Protein-calorie malnutrition, severe   AIDS (acquired immune deficiency syndrome) (HCC)   Acute respiratory failure with hypoxia (HCC)   Septic shock (HCC)   Acute renal insufficiency   AKI (acute kidney injury) (HCC)   Pressure injury of skin  Severe persistent cryptococcal meningoencephalitis  -Persistent despite having undergone 2 rounds of induction chemotherapy over the past 6 months.  -Repeat spinal fluid shows yeast on stain which may reflect active infection or nonviable yeast following recent treatment. -Infectious disease started treatment with amphotericin and 5 flucytosine and now deescalated to fluconazole 400mg  daily for 8 weeks -Recommeding De-escalating to 200 mg po Daily after 8  weeks and starting back Anti-Retroviral Therapy then  HIV/AIDS -Infectious diseaserecommends holding off on starting antiretroviral therapy until he has gone through repeat induction and at least 8 weeks of consolidation therapy with Fluconazole 400 mg po Daily to avoid immune reconstitution inflammatory syndrome.  -Continue Bactrim DS 1 tablet Mondays Wednesdays and Fridays for PCP prophylaxis and Continue Azithromycin 1200 mg weekly for prophylaxis.  AKI -Cr 1.18 -> 1.55 and now went to 1.29 -IVF started at 100 mL/hr  -Trend BMP's and avoid Nephrotoxic Medications if possible   HTN -Continue Metoprolol 25 mg twice daily -C/w IV Hydralazine 10 mg q4hprn SBP >160 -Stable   Persistent Thrombocytopenia -Likely related to flucytosine. No bleeding reported. -Monitor platelets and Continue to Monitor fro S/Sx of Bleeding -Platelet Count improved to 75,000 -Repeat CBC in AM   Severe Protein Calorie Malnutrition -Dietitian consultation appreciated -C/w Feeding Supplements 237 mL po TID  -C/w Megestrol 400 mg po Daily and MVI 1 tab po Daily   Hypomagnesemia -Patient's Mag Level was 1.5 -Replete with IV Mag Sulfate 2 grams -Continue to Monitor and Replete as Necessary -Repeat Mag Level in AM   Normocytic Anemia -Patient's Hb/Hct went from 8.4/25.2 -> 7.1/21.1 -?Related to Dilutional Drop vs. Medications -Will type and Screen and Transfuse 1 unit of pRBC's -Continue to Monitor for S/Sx of Bleeding -Repeat CBC in AM   Hyperglycemia -C/w Insulin SSI -Check HbA1c -CBG's ranging from 72-151  DVT prophylaxis: SCD's given Thrombocytopenia and and Anemia  Code Status: FULL CODE  Family Communication: No family present at bedside  Disposition Plan: Anticipate D/C to SNF when Bed Available   Consultants:  PCCM Infectious Diseases    Procedures: None  Antimicrobials:  Anti-infectives (  From admission, onward)   Start     Dose/Rate Route Frequency Ordered Stop   01/11/18  1800  fluconazole (DIFLUCAN) tablet 400 mg     400 mg Oral Daily 01/11/18 1057     01/08/18 1600  azithromycin (ZITHROMAX) tablet 1,200 mg     1,200 mg Oral Weekly 01/08/18 1420     01/06/18 1800  flucytosine (ANCOBON) capsule 1,250 mg  Status:  Discontinued     25 mg/kg  48.9 kg Oral Every 6 hours 01/06/18 1430 01/06/18 1454   01/06/18 1800  flucytosine (ANCOBON) capsule 1,250 mg  Status:  Discontinued     25 mg/kg  48.9 kg Oral Every 6 hours 01/06/18 1454 01/11/18 1057   01/06/18 1700  amphotericin B liposome (AMBISOME) 200 mg in dextrose 5 % 500 mL IVPB  Status:  Discontinued     4 mg/kg  48.9 kg 250 mL/hr over 120 Minutes Intravenous Every 24 hours 01/06/18 1430 01/11/18 1057   01/06/18 0900  sulfamethoxazole-trimethoprim (BACTRIM DS,SEPTRA DS) 800-160 MG per tablet 1 tablet     1 tablet Oral Once per day on Mon Wed Fri 01/05/18 1438     01/05/18 1800  azithromycin (ZITHROMAX) 200 MG/5ML suspension 1,200 mg  Status:  Discontinued     1,200 mg Per Tube Weekly 01/05/18 1717 01/08/18 1420   01/05/18 1600  azithromycin (ZITHROMAX) tablet 1,200 mg  Status:  Discontinued     1,200 mg Oral Weekly 01/05/18 1438 01/05/18 1717   01/05/18 1200  fluconazole (DIFLUCAN) 40 MG/ML suspension 400 mg  Status:  Discontinued     400 mg Per Tube Daily 01/05/18 1057 01/06/18 1012   01/05/18 0200  piperacillin-tazobactam (ZOSYN) IVPB 2.25 g  Status:  Discontinued     2.25 g 100 mL/hr over 30 Minutes Intravenous Every 8 hours 01/04/18 2044 01/07/18 1650   01/04/18 2015  piperacillin-tazobactam (ZOSYN) IVPB 3.375 g  Status:  Discontinued     3.375 g 100 mL/hr over 30 Minutes Intravenous  Once 01/04/18 2000 01/04/18 2005   01/04/18 2015  vancomycin (VANCOCIN) IVPB 1000 mg/200 mL premix  Status:  Discontinued     1,000 mg 200 mL/hr over 60 Minutes Intravenous  Once 01/04/18 2000 01/04/18 2005   01/04/18 1800  fluconazole (DIFLUCAN) 40 MG/ML suspension 400 mg     400 mg Per Tube  Once 01/04/18 1750 01/04/18  1949   01/04/18 1615  vancomycin (VANCOCIN) IVPB 1000 mg/200 mL premix     1,000 mg 200 mL/hr over 60 Minutes Intravenous  Once 01/04/18 1603 01/04/18 1914   01/04/18 1615  piperacillin-tazobactam (ZOSYN) IVPB 3.375 g     3.375 g 100 mL/hr over 30 Minutes Intravenous  Once 01/04/18 1603 01/04/18 1813     Subjective: Seen and examined and is hard of hearing but had no acute issues and denied any pain. Stated he was ok. No SOB. Had delayed responses. No lightheadedness or dizziness.   Objective: Vitals:   01/13/18 1405 01/13/18 1500 01/13/18 1513 01/13/18 1630  BP: (!) 141/87 (!) 149/100 (!) 145/97 118/80  Pulse: 81 74  85  Resp: 18 18  20   Temp: 98.2 F (36.8 C) 97.8 F (36.6 C)  97.9 F (36.6 C)  TempSrc: Oral Oral  Oral  SpO2: 100% 100%    Weight:      Height:        Intake/Output Summary (Last 24 hours) at 01/13/2018 1634 Last data filed at 01/13/2018 1630 Gross per 24 hour  Intake 2680.33  ml  Output 2100 ml  Net 580.33 ml   Filed Weights   01/11/18 0500 01/12/18 0446 01/13/18 0534  Weight: 43.8 kg (96 lb 9 oz) 42.9 kg (94 lb 9.2 oz) 46.1 kg (101 lb 10.1 oz)   Examination: Physical Exam:  Constitutional: Thin Cachectic Frail appearing AAM in NAD Eyes: Lids and conjunctivae normal, sclerae anicteric  ENMT: External Ears, Nose appear normal. Hard of hearing. Mucous membranes are moist. Neck: Appears normal, supple, no cervical masses, normal ROM, no appreciable thyromegaly, no JVD Respiratory: Diminished to auscultation bilaterally, no wheezing, rales, rhonchi or crackles. Normal respiratory effort and patient is not tachypenic. No accessory muscle use.  Cardiovascular: RRR, no murmurs / rubs / gallops. S1 and S2 auscultated. No extremity edema.  Abdomen: Soft, non-tender, non-distended. No masses palpated. No appreciable hepatosplenomegaly. Bowel sounds positive x4.  GU: Deferred. Musculoskeletal: No clubbing / cyanosis of digits/nails. No joint deformity upper and  lower extremities. Skin: No rashes, lesions, ulcers on a limited skin eval. No induration; Warm and dry.  Neurologic: CN 2-12 grossly intact with no focal deficits except that patient has delayed responses and is hard of hearing. Romberg sign and cerebellar reflexes not assessed.  Psychiatric: Normal judgment and insight. Alert and oriented x 2. Normal mood and flat affect.   Data Reviewed: I have personally reviewed following labs and imaging studies  CBC: Recent Labs  Lab 01/08/18 0238 01/09/18 0312 01/11/18 0310 01/12/18 0417 01/13/18 0647  WBC 3.9* 6.3 6.2 5.9 5.4  HGB 10.1* 9.3* 8.5* 8.4* 7.1*  HCT 32.9* 28.9* 26.2* 25.2* 21.1*  MCV 85.0 82.6 80.4 80.5 82.4  PLT 63* 49* 26* 45* 72*   Basic Metabolic Panel: Recent Labs  Lab 01/09/18 0312 01/10/18 0730 01/10/18 0819 01/11/18 0310 01/12/18 0417 01/13/18 0647  NA 132* 127* 128* 130* 130* 134*  K 3.7 3.9 4.1 4.7 5.0 5.1  CL 105 103 104 108 108 114*  CO2 16* 15* 16* 14* 14* 14*  GLUCOSE 110* 140* 120* 134* 88 88  BUN 29* 26* 28* 27* 34* 34*  CREATININE 1.50* 1.20 1.19 1.18 1.55* 1.29*  CALCIUM 8.0* 8.0* 8.0* 7.9* 8.4* 8.2*  MG 1.5* 1.6*  --  2.0 1.8 1.5*   GFR: Estimated Creatinine Clearance: 40.7 mL/min (A) (by C-G formula based on SCr of 1.29 mg/dL (H)). Liver Function Tests: Recent Labs  Lab 01/08/18 0736 01/10/18 0819  AST 290* 84*  ALT 432* 217*  ALKPHOS 114 152*  BILITOT 0.7 1.1  PROT 6.8 7.5  ALBUMIN 2.1* 2.3*   No results for input(s): LIPASE, AMYLASE in the last 168 hours. No results for input(s): AMMONIA in the last 168 hours. Coagulation Profile: No results for input(s): INR, PROTIME in the last 168 hours. Cardiac Enzymes: No results for input(s): CKTOTAL, CKMB, CKMBINDEX, TROPONINI in the last 168 hours. BNP (last 3 results) No results for input(s): PROBNP in the last 8760 hours. HbA1C: No results for input(s): HGBA1C in the last 72 hours. CBG: Recent Labs  Lab 01/13/18 0021 01/13/18 0420  01/13/18 0744 01/13/18 1204 01/13/18 1623  GLUCAP 103* 122* 80 151* 72   Lipid Profile: No results for input(s): CHOL, HDL, LDLCALC, TRIG, CHOLHDL, LDLDIRECT in the last 72 hours. Thyroid Function Tests: No results for input(s): TSH, T4TOTAL, FREET4, T3FREE, THYROIDAB in the last 72 hours. Anemia Panel: No results for input(s): VITAMINB12, FOLATE, FERRITIN, TIBC, IRON, RETICCTPCT in the last 72 hours. Sepsis Labs: No results for input(s): PROCALCITON, LATICACIDVEN in the last 168 hours.  Recent Results (from the past 240 hour(s))  Culture, blood (Routine x 2)     Status: None   Collection Time: 01/04/18  3:53 PM  Result Value Ref Range Status   Specimen Description   Final    BLOOD RIGHT ARM Performed at [facility], [address], [place], [place] [address]    Special Requests   Final    IN PEDIATRIC BOTTLE Blood Culture adequate volume Performed at [facility], [address] [place], [place] [address]    Culture   Final    NO GROWTH 5 DAYS Performed at [facility], [address], [place], [place] [address]    Report Status 01/09/2018 FINAL  Final  Respiratory Panel by PCR     Status: None   Collection Time: 01/04/18  8:20 PM  Result Value Ref Range Status   Adenovirus NOT DETECTED NOT DETECTED Final   Coronavirus 229E NOT DETECTED NOT DETECTED Final   Coronavirus HKU1 NOT DETECTED NOT DETECTED Final   Coronavirus NL63 NOT DETECTED NOT DETECTED Final   Coronavirus OC43 NOT DETECTED NOT DETECTED Final   Metapneumovirus NOT DETECTED NOT DETECTED Final   Rhinovirus / Enterovirus NOT DETECTED NOT DETECTED Final   Influenza A NOT DETECTED NOT DETECTED Final   Influenza B NOT DETECTED NOT DETECTED Final   Parainfluenza Virus 1 NOT DETECTED NOT DETECTED Final   Parainfluenza Virus 2 NOT DETECTED NOT DETECTED Final   Parainfluenza Virus 3 NOT DETECTED NOT DETECTED Final   Parainfluenza Virus 4 NOT DETECTED NOT DETECTED Final     Respiratory Syncytial Virus NOT DETECTED NOT DETECTED Final   Bordetella pertussis NOT DETECTED NOT DETECTED Final   Chlamydophila pneumoniae NOT DETECTED NOT DETECTED Final   Mycoplasma pneumoniae NOT DETECTED NOT DETECTED Final  Culture, Urine     Status: Abnormal   Collection Time: 01/04/18  8:20 PM  Result Value Ref Range Status   Specimen Description   Final    URINE, CATHETERIZED Performed at [facility], [address], [place], [place] [address]    Special Requests   Final    NONE Performed at [facility], [address] [place], [place] [address]    Culture MULTIPLE SPECIES PRESENT, SUGGEST RECOLLECTION (A)  Final   Report Status 01/06/2018 FINAL  Final  MRSA PCR Screening     Status: None   Collection Time: 01/04/18  8:21 PM  Result Value Ref Range Status   MRSA by PCR NEGATIVE NEGATIVE Final    Comment:        The GeneXpert MRSA Assay (FDA approved for NASAL specimens only), is one component of a comprehensive MRSA colonization surveillance program. It is not intended to diagnose MRSA infection nor to guide or monitor treatment for MRSA infections. Performed at [facility], [address], [place], [place] [address]   Culture, blood (Routine x 2)     Status: None   Collection Time: 01/05/18  3:25 AM  Result Value Ref Range Status   Specimen Description   Final    BLOOD LEFT ANTECUBITAL Performed at [facility], [address], [place], [place] [address]    Special Requests   Final    BOTTLES DRAWN AEROBIC AND ANAEROBIC Blood Culture adequate volume Performed at [facility], [address] [place], [place] [address]    Culture   Final    NO GROWTH 5 DAYS Performed at [facility], [address] [place], [place] [address]    Report Status 01/10/2018 FINAL  Final  Acid Fast Smear (AFB)     Status: None   Collection Time: 01/05/18  8:30 PM  Result  Value Ref Range Status   AFB Specimen Processing UEA5  Final    Comment: (NOTE) The specimen submitted does not meet the laboratory's criteria for acceptability. Refer to [employer] [facility] for specimen acceptability criteria. Notified: Vangie Bicker. 01/13/2018-[place]    Acid Fast Smear NOT PERFORMED  Final    Comment: (NOTE) Test not performed Performed At: [facility] [address] [place], [place] [id number] Jolene Schimke MD [id number] Performed at [facility], [address], [place], [place] [address]   Acid Fast Culture with reflexed sensitivities     Status: None   Collection Time: 01/05/18  8:30 PM  Result Value Ref Range Status   Acid Fast Culture NGE9  Final    Comment: (NOTE) The specimen submitted does not meet the laboratory's criteria for acceptability. Refer to [employer] for specimen acceptability criteria. Notified: Vangie Bicker. 01/13/2018-[place] Performed At: [facility] [address] [place], [place] [id number] Jolene Schimke MD [id number] Performed at [facility], [address] [place], [place] [address]   CSF culture     Status: None   Collection Time: 01/06/18  1:29 PM  Result Value Ref Range Status   Specimen Description   Final    CSF Performed at [facility], [address] [place], [place] [address]    Special Requests   Final    NONE Performed at [facility], [address] [place], [place] [address]    Gram Stain   Final    WBC PRESENT, PREDOMINANTLY MONONUCLEAR YEAST PRESENT CYTOSPIN SMEAR CRITICAL RESULT CALLED TO, READ BACK BY AND VERIFIED WITH: Alison Stalling. RN @1930  ON 02.20.19 BY COHEN,K Performed at [facility], [address] [place], [place] [address]    Culture   Final    NO GROWTH 3 DAYS Performed at [facility], [address], [place], [place] [address]     Report Status 01/10/2018 FINAL  Final  Fungus Culture With Stain     Status: None (Preliminary result)   Collection Time: 01/06/18  1:29 PM  Result Value Ref Range Status   Fungus Stain Final report  Final    Comment: (NOTE) Performed At: [facility] [address] [place], [place] [id number] Jolene Schimke MD [id number]    Fungus (Mycology) Culture PENDING  Incomplete   Fungal Source CSF  Final    Comment: Performed at [facility], [address] [place], [place] [address]  Fungus Culture Result     Status: None   Collection Time: 01/06/18  1:29 PM  Result Value Ref Range Status   Result 1 Comment  Final    Comment: (NOTE) KOH/Calcofluor preparation:  no fungus observed. Performed At: [facility] [address] [place], [place] [id number] Jolene Schimke MD [id number] Performed at [facility], [address], [place], [place] [address]   Urine Culture     Status: None   Collection Time: 01/06/18  3:29 PM  Result Value Ref Range Status   Specimen Description   Final    URINE, CATHETERIZED Performed at [facility], [address], [place], [place] [address]    Special Requests   Final    NONE  Performed at [facility], [address] [place], [place] [id number]    Culture   Final    NO GROWTH Performed at [facility], [address] [place], [place] [address]    Report Status 01/08/2018 FINAL  Final    Radiology Studies: No results found.   Scheduled Meds: . azithromycin  1,200 mg Oral Weekly  . chlorhexidine  15 mL Mouth Rinse BID  . feeding supplement (ENSURE ENLIVE)  237 mL Oral TID  . fluconazole  400 mg Oral Daily  . insulin aspart  1-3 Units Subcutaneous Q4H  . mouth rinse  15 mL Mouth Rinse q12n4p  . megestrol  400 mg Oral Daily  . metoprolol tartrate  25 mg Oral BID  . multivitamin with minerals  1 tablet Oral Daily  . pantoprazole  40 mg Oral Daily  .  sulfamethoxazole-trimethoprim  1 tablet Oral Once per day on Mon Wed Fri   Continuous Infusions: . sodium chloride    . sodium chloride 100 mL/hr at 01/13/18 1632    LOS: 9 days   Merlene Laughtermair Latif Sheikh, [place] [facility] Pager [phone]  If 7PM-7AM, please contact night-coverage [email or web address] Password [email or web address] 01/13/2018, 4:34 PM

## 2018-01-14 LAB — HEMOGLOBIN A1C
Hgb A1c MFr Bld: 5.7 % — ABNORMAL HIGH (ref 4.8–5.6)
Mean Plasma Glucose: 116.89 mg/dL

## 2018-01-14 LAB — TYPE AND SCREEN
ABO/RH(D): O POS
Antibody Screen: NEGATIVE
Unit division: 0

## 2018-01-14 LAB — BPAM RBC
Blood Product Expiration Date: 201903292359
ISSUE DATE / TIME: 201902271338
Unit Type and Rh: 5100

## 2018-01-14 LAB — CBC WITH DIFFERENTIAL/PLATELET
BASOS PCT: 0 %
Basophils Absolute: 0 10*3/uL (ref 0.0–0.1)
EOS ABS: 0.3 10*3/uL (ref 0.0–0.7)
Eosinophils Relative: 5 %
HEMATOCRIT: 26.8 % — AB (ref 39.0–52.0)
HEMOGLOBIN: 8.9 g/dL — AB (ref 13.0–17.0)
LYMPHS ABS: 0.6 10*3/uL — AB (ref 0.7–4.0)
LYMPHS PCT: 12 %
MCH: 28 pg (ref 26.0–34.0)
MCHC: 33.2 g/dL (ref 30.0–36.0)
MCV: 84.3 fL (ref 78.0–100.0)
Monocytes Absolute: 0.8 10*3/uL (ref 0.1–1.0)
Monocytes Relative: 16 %
NEUTROS ABS: 3.6 10*3/uL (ref 1.7–7.7)
Neutrophils Relative %: 67 %
Platelets: 92 10*3/uL — ABNORMAL LOW (ref 150–400)
RBC: 3.18 MIL/uL — ABNORMAL LOW (ref 4.22–5.81)
RDW: 18.6 % — AB (ref 11.5–15.5)
WBC: 5.3 10*3/uL (ref 4.0–10.5)

## 2018-01-14 LAB — PHOSPHORUS: Phosphorus: 3.8 mg/dL (ref 2.5–4.6)

## 2018-01-14 LAB — COMPREHENSIVE METABOLIC PANEL
ALK PHOS: 236 U/L — AB (ref 38–126)
ALT: 81 U/L — ABNORMAL HIGH (ref 17–63)
ANION GAP: 5 (ref 5–15)
AST: 36 U/L (ref 15–41)
Albumin: 2.3 g/dL — ABNORMAL LOW (ref 3.5–5.0)
BUN: 30 mg/dL — ABNORMAL HIGH (ref 6–20)
CALCIUM: 8.3 mg/dL — AB (ref 8.9–10.3)
CO2: 15 mmol/L — ABNORMAL LOW (ref 22–32)
Chloride: 114 mmol/L — ABNORMAL HIGH (ref 101–111)
Creatinine, Ser: 1.05 mg/dL (ref 0.61–1.24)
GFR calc Af Amer: >60 mL/min (ref >60)
GFR calc non Af Amer: >60 mL/min (ref >60)
Glucose, Bld: 90 mg/dL (ref 65–99)
Potassium: 5.1 mmol/L (ref 3.5–5.1)
Sodium: 134 mmol/L — ABNORMAL LOW (ref 135–145)
TOTAL PROTEIN: 7.3 g/dL (ref 6.5–8.1)
Total Bilirubin: 0.5 mg/dL (ref 0.3–1.2)

## 2018-01-14 LAB — GLUCOSE, CAPILLARY
GLUCOSE-CAPILLARY: 94 mg/dL (ref 65–99)
GLUCOSE-CAPILLARY: 121 mg/dL — AB (ref 65–99)
GLUCOSE-CAPILLARY: 101 mg/dL — AB (ref 65–99)
Glucose-Capillary: 106 mg/dL — ABNORMAL HIGH (ref 65–99)
Glucose-Capillary: 120 mg/dL — ABNORMAL HIGH (ref 65–99)
Glucose-Capillary: 94 mg/dL (ref 65–99)
Glucose-Capillary: 96 mg/dL (ref 65–99)

## 2018-01-14 LAB — MAGNESIUM: MAGNESIUM: 1.7 mg/dL (ref 1.7–2.4)

## 2018-01-14 MED ORDER — SULFAMETHOXAZOLE-TRIMETHOPRIM 800-160 MG PO TABS: 1.0000 | ORAL_TABLET | ORAL | Status: AC

## 2018-01-14 MED ORDER — ENSURE ENLIVE PO LIQD: 237.0000 mL | Freq: Three times a day (TID) | ORAL | 12 refills | Status: DC

## 2018-01-14 MED ORDER — MAGNESIUM SULFATE 2 GM/50ML IV SOLN
2.0000 g | Freq: Once | INTRAVENOUS | Status: AC
  Administered 2018-01-14: 2 g via INTRAVENOUS
  Filled 2018-01-14: qty 50

## 2018-01-14 MED ORDER — ACETAMINOPHEN 325 MG PO TABS: 650.0000 mg | ORAL_TABLET | ORAL | Status: AC | PRN

## 2018-01-14 MED ORDER — FLUCONAZOLE 200 MG PO TABS: 400.0000 mg | ORAL_TABLET | Freq: Every day | ORAL | 0 refills | Status: AC

## 2018-01-14 MED ORDER — PANTOPRAZOLE SODIUM 40 MG PO TBEC: 40.0000 mg | DELAYED_RELEASE_TABLET | Freq: Every day | ORAL | Status: AC

## 2018-01-14 MED ORDER — METOPROLOL TARTRATE 25 MG PO TABS: 25.0000 mg | ORAL_TABLET | Freq: Two times a day (BID) | ORAL | Status: AC

## 2018-01-14 MED ORDER — ADULT MULTIVITAMIN W/MINERALS CH: 1.0000 | ORAL_TABLET | Freq: Every day | ORAL | Status: AC

## 2018-01-14 MED ORDER — MEGESTROL ACETATE 400 MG/10ML PO SUSP: 400.0000 mg | Freq: Every day | ORAL | 0 refills | Status: AC

## 2018-01-14 NOTE — Discharge Summary (Addendum)
Physician Discharge Summary  Kenneth Rivas WUJ:[id number] DOB: 09/21/1961 DOA: 01/04/2018  PCP: Daiva Eves, Lisette Grinder, MD  Admit date: 01/04/2018 Discharge date: 01/15/2018  Admitted From: Home Disposition: SNF  ADDENDUM: Patient did not leave yesterday because bed was not available. He was seen and examined at bedside and no changes from yesterday. He is stable to D/C to SNF today as bed is available.   Recommendations for Outpatient Follow-up:  1. Follow up with PCP in 1-2 weeks 2. Follow up with Infectious Diseases as an outpatient  3. Please obtain CMP/CBC, Phos, Mag in one week 4. 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 After discharge, he had stopped taking his medications. He presented to the ED for altered mental status, inability to protect airway and was subsequently intubated on 2/18 and extubated 2/20. Patientwas treatedfor severe persistent cryptococcal meningoencephalitis with amphotericin and 5 flucytosine and now deescalated to fluconazole. ID recommending treating patient with 400 mg of Fluconazole Daily and holding ART in that time and then after 8 weeks decreasing dose to Fluconazole to 200 mg po Daily and starting Antiretroviral Therapy. Hospitalization has been complicated by Anemia for which he was transfused 1 unit of pRBC's. He was deemed medically stable to D/C to SNF and will need to follow  up with PCP and Infectious Diseases as an outpatient.  Discharge Diagnoses:  Principal Problem:   Cryptococcal meningoencephalitis (HCC) Active Problems:   Cachexia associated with AIDS (HCC)   Protein-calorie malnutrition, severe   AIDS (acquired immune deficiency syndrome) (HCC)   Acute respiratory failure with hypoxia (HCC)   Septic shock (HCC)   Acute renal insufficiency   AKI (acute kidney injury) (HCC)   Pressure injury of skin  Severe Persistent Cryptococcal Meningoencephalitis  -Persistent despite having undergone 2 rounds of induction chemotherapy over the past 6 months.  -Repeat spinal fluid shows yeast on stain which may reflect active infection or nonviable yeast following recent treatment. -Infectious diseasestarted treatment withAmphotericin and 5 flucytosineand now deescalated to fluconazole 400mg  dailyfor 8 weeks -Recommeding De-escalating to 200 mg po Daily after 8 weeks and starting back Anti-Retroviral Therapy then -Will need Infectious Disease Follow up Closely   HIV/AIDS -Infectious diseaserecommends holding off on starting antiretroviral therapy until he has gone through repeat induction and at least 8 weeks of consolidation therapy with Fluconazole 400 mg po Daily to avoid immune reconstitution inflammatory syndrome.  -Continue Bactrim DS 1 tablet Mondays Wednesdays and Fridays for PCP prophylaxis and Continue Azithromycin 1200 mg weekly for prophylaxis.  AKI, improved  -Cr 1.18 -> 1.55 and now went to 0.96 -IVF started at 100 mL/hr and will continue until D/C'd  -Trend BMP's and avoid Nephrotoxic Medications if possible   HTN -Continue Metoprolol 25 mg twice daily -C/w IV Hydralazine 10 mg q4hprn SBP >160 -Stable  Persistent Thrombocytopenia, improving -Likely related to flucytosine. No bleeding reported. -Monitor platelets and Continue to Monitor fro S/Sx of Bleeding -Platelet Count improved to 126,000 -Repeat CBC  as an outpatient    Severe Protein Calorie Malnutrition -Dietitian consultation appreciated -C/w Feeding Supplements 237 mL po TID  -C/w Megestrol 400 mg po Daily and MVI 1 tab po Daily   Hypomagnesemia -Patient's Mag Level was 1.6 -Replete with IV Mag Sulfate 2 grams again today prior to D/C -Continue to Monitor and Replete as Necessary -Repeat Mag Level in AM   Normocytic Anemia -Patient's Hb/Hct went from 8.4/25.2 -> 7.1/21.1 -> 8.9/26.8 (after 1 unit of Blood) -> 8.4/26/2 (? Dilutional Drop) -?Related to Dilutional Drop vs. Medications -Type and Screened and s/p Transfuse 1 unit of pRBC's -Continue to Monitor for S/Sx of Bleeding -Repeat CBC at SNF  Hyperglycemia/Pre-Diabetes -C/w Insulin SSI while hospitalized  -Checked HbA1c and was 5.7 -CBG's ranging from 88-121  Discharge Instructions Discharge Instructions    Call MD for:  difficulty breathing, headache or visual disturbances   Complete by:  As directed    Call MD for:  extreme fatigue   Complete by:  As directed    Call MD for:  hives   Complete by:  As directed    Call MD for:  persistant dizziness or light-headedness   Complete by:  As directed    Call MD for:  persistant nausea and vomiting   Complete by:  As directed    Call MD for:  redness, tenderness, or signs of infection (pain, swelling, redness, odor or green/yellow discharge around incision site)   Complete by:  As directed    Call MD for:  severe uncontrolled pain   Complete by:  As directed    Call MD for:  temperature >100.4   Complete by:  As directed    Diet - low sodium heart healthy   Complete by:  As directed    DYSPHAGIA 2 (FINE CHOP) with THIN LIQUIDS   Discharge instructions   Complete by:  As directed    Follow up with PCP and Infectious Diseases as an outpatient. Take all medications as prescribed. If symptoms change or worsen please return to the ED for evaluation.   Increase activity slowly   Complete by:  As directed      Allergies as of  01/15/2018   No Known Allergies     Medication List    STOP taking these medications   carvedilol 3.125 MG tablet Commonly known as:  COREG   sodium bicarbonate 650 MG tablet     TAKE these medications   acetaminophen 325 MG tablet Commonly known as:  TYLENOL Take 2 tablets (650 mg total) by mouth every 4 (four) hours as needed for mild pain (temp > 101.5).   azithromycin 600 MG tablet Commonly known as:  ZITHROMAX Take 2 tablets (1,200 mg total) by mouth once a week.   feeding supplement (ENSURE ENLIVE) Liqd Take 237 mLs by mouth 3 (three) times daily.   fluconazole 200 MG tablet Commonly known as:  DIFLUCAN Take 2 tablets (400 mg total) by mouth daily. After 8 Weeks change to po 200 mg Daily What changed:  additional instructions   ibuprofen 200 MG tablet Commonly known as:  ADVIL,MOTRIN Take 200-600 mg by mouth every 6 (six) hours as needed for moderate pain.   megestrol 400 MG/10ML suspension Commonly known as:  MEGACE Take 10 mLs (400 mg total) by mouth daily.   metoprolol tartrate 25 MG tablet Commonly known as:  LOPRESSOR Take 1 tablet (25 mg total) by mouth 2 (two) times daily.   multivitamin with minerals Tabs tablet Take 1  tablet by mouth daily.   pantoprazole 40 MG tablet Commonly known as:  PROTONIX Take 1 tablet (40 mg total) by mouth daily.   polyvinyl alcohol 1.4 % ophthalmic solution Commonly known as:  LIQUIFILM TEARS Place 1 drop into both eyes at bedtime.   sulfamethoxazole-trimethoprim 800-160 MG tablet Commonly known as:  BACTRIM DS,SEPTRA DS Take 1 tablet by mouth 3 (three) times a week. What changed:  when to take this      Contact information for after-discharge care    Destination    [facility] .   Service:  Skilled Nursing Contact information: [address] [place] [address] [phone]             No Known Allergies  Consultations:  Infectious  Diseases  Procedures/Studies: Dg Abd 1 View  Result Date: 01/06/2018 CLINICAL DATA:  OG tube placement. EXAM: ABDOMEN - 1 VIEW COMPARISON:  Radiograph 01/04/2018 FINDINGS: Tip of the enteric tube below the diaphragm in the stomach, side-port in the region of the a gastroesophageal junction, advancement of least 3 cm would lead to optimal placement. Right femoral catheter tip is in the left pelvis directed inferiorly likely in the left iliac vessels. No bowel dilatation to suggest obstruction. Phleboliths in the pelvis. IMPRESSION: 1. Side port of the enteric tube in the region of the gastroesophageal junction, recommend advancement of at least 3 cm. 2. Right femoral catheter in place, tip to the left of midline directed inferiorly in the region of the left common iliac vessels. Recommend repositioning. Electronically Signed   By: Rubye Oaks M.D.   On: 01/06/2018 04:26   Ct Head Wo Contrast  Result Date: 01/04/2018 CLINICAL DATA:  Altered mental status. HIV positive with history of cryptococcal meningitis. EXAM: CT HEAD WITHOUT CONTRAST TECHNIQUE: Contiguous axial images were obtained from the base of the skull through the vertex without intravenous contrast. COMPARISON:  Head CT 11/26/2017 and MRI brain 06/25/2017 FINDINGS: Brain: Age advanced cerebral atrophy, ventriculomegaly and periventricular white matter disease. No acute intracranial findings or mass lesions. No extra-axial fluid collections. Vascular: No hyperdense vessels or obvious aneurysm. Skull: No skull fracture or bone lesion. Sinuses/Orbits: The paranasal sinuses and mastoid air cells are clear. The globes are intact. Other: No scalp lesions or hematoma. IMPRESSION: Chronic brain changes as described above.  No acute findings. Electronically Signed   By: Rudie Meyer M.D.   On: 01/04/2018 20:54   US Renal  Result Date: 01/05/2018 CLINICAL DATA:  Acute kidney injury EXAM: RENAL / URINARY TRACT ULTRASOUND COMPLETE COMPARISON:   Overlapping portions from CT chest dated 06/17/2017 FINDINGS: Right Kidney: Length: 10.9 cm. The upper pole the right kidney seems slightly hypoechoic with respect to the lower pole, although some of this may be secondary to poor characterization of the upper pole. No mass or hydronephrosis visualized. No stones are noted. Left Kidney: Length: 9.6 cm. Heterogeneity in parenchymal echogenicity with the lower pole seemingly slightly hypoechoic compared to the upper pole, significance uncertain. No mass or hydronephrosis visualized. Bladder: The bladder is empty and appears otherwise normal for degree of bladder distention. A Foley catheter is in place. IMPRESSION: 1. Mild heterogeneity and renal parenchymal echogenicity bilaterally as noted above. Possibilities might include glomerular nephritis or pyelonephritis. No definite perirenal fluid collection. Correlate with urine analysis in determining whether further workup is warranted. 2. No stones or hydronephrosis identified. Electronically Signed   By: Gaylyn Rong M.D.   On: 01/05/2018 10:50   Dg  Chest Port 1 View  Result Date: 01/07/2018 CLINICAL DATA:  Respiratory failure. EXAM: PORTABLE CHEST 1 VIEW COMPARISON:  01/06/2018. FINDINGS: Interim extubation and removal of NG tube. Mediastinum and hilar structures are normal. Heart size stable. No focal infiltrate. No acute pulmonary disease identified. No pleural effusion or pneumothorax. Heart size normal. IMPRESSION: 1.  Interim extubation removal of NG tube. 2.  No acute pulmonary disease identified. Electronically Signed   By: Maisie Fus  Register   On: 01/07/2018 06:51   Dg Chest Port 1 View  Result Date: 01/06/2018 CLINICAL DATA:  Re-intubation. Acute respiratory failure with hypoxia. EXAM: PORTABLE CHEST 1 VIEW COMPARISON:  Radiograph yesterday at 0432 hour FINDINGS: Endotracheal tube 4.7 cm from the carina. Enteric tube in place with side-port in the region of the gastroesophageal junction. Mild  hyperinflation. Mild infrahilar atelectasis. No focal airspace opacity. No pleural fluid or pneumothorax. IMPRESSION: 1. Endotracheal tube tip at the thoracic inlet. Enteric tube in place. 2. Mild infrahilar atelectasis. Electronically Signed   By: Rubye Oaks M.D.   On: 01/06/2018 04:30   Dg Chest Port 1 View  Result Date: 01/05/2018 CLINICAL DATA:  Respiratory failure EXAM: PORTABLE CHEST 1 VIEW COMPARISON:  01/04/2018 FINDINGS: Endotracheal tube is unchanged. NG tube is been placed into the stomach. The side port is near the GE junction. Lungs are clear. Heart is normal size. No effusions or acute bony abnormality. IMPRESSION: No active disease. Electronically Signed   By: Charlett Nose M.D.   On: 01/05/2018 07:26   Dg Chest Portable 1 View  Result Date: 01/04/2018 CLINICAL DATA:  Encounter for endotracheal tube position. EXAM: PORTABLE CHEST 1 VIEW COMPARISON:  11/26/2017 FINDINGS: The heart size and mediastinal contours are within normal limits. Tip of endotracheal tube is 5.4 cm above the carina. Both lungs are hyperinflated but clear. No pneumothorax or effusion. Costophrenic angles are excluded on this study bilaterally. The visualized skeletal structures are unremarkable. IMPRESSION: Satisfactory endotracheal tube position with the tip 5.4 cm above the carina. Hyperinflated lungs. Electronically Signed   By: Tollie Eth M.D.   On: 01/04/2018 18:13   Dg Abd Portable 1v  Result Date: 01/04/2018 CLINICAL DATA:  NG tube placement. EXAM: PORTABLE ABDOMEN - 1 VIEW COMPARISON:  None. FINDINGS: The NG tube tip is in the fundal region of the stomach. The proximal port is in the distal esophagus. The tube could be advanced several cm. IMPRESSION: NG tube tip is in the fundal region the stomach with the proximal port in the distal esophagus. Electronically Signed   By: Rudie Meyer M.D.   On: 01/04/2018 19:41   Dg Fluoro Guide Lumbar Puncture  Result Date: 12/17/2017 CLINICAL DATA:  Cryptococcal  meningoencephalitis EXAM: DIAGNOSTIC LUMBAR PUNCTURE UNDER FLUOROSCOPIC GUIDANCE FLUOROSCOPY TIME:  Fluoroscopy Time:  0 minutes, 36 seconds Radiation Exposure Index (if provided by the fluoroscopic device): 14.3 mGy Number of Acquired Spot Images: 0 PROCEDURE: I discussed the risks (including hemorrhage, infection, headache, and nerve damage, among others), benefits, and alternatives to fluoroscopically guided lumbar puncture by telephone with the patient's sister, who is guiding medical decision-making on behalf of the patient due to his altered mental status. We specifically discussed the high technical likelihood of success of the procedure. She understood and elected for the patient to undergo the procedure. Standard time-out was employed. Following sterile skin prep and local anesthetic administration consisting of 1 percent lidocaine, a 22 gauge spinal needle was advanced without difficulty into the thecal sac at the at the L2-3 level. Clear  CSF was returned. Opening pressure was 17 cm of water. 10 cc of clear CSF was collected. The CSF return was very sluggish. The needle was subsequently removed and the skin cleansed and bandaged. No immediate complications were observed. IMPRESSION: 1. Technically successful fluoroscopically guided lumbar puncture at the L2-3 level. Opening pressure was 17 cm of water. Electronically Signed   By: Gaylyn RongWalter  Liebkemann M.D.   On: 12/17/2017 16:27    Subjective: Seen and examined at bedside and had no complaints. No CP or SOB. No Pain. Had delayed responses but is stable. No family present at bedside.  Discharge Exam: Vitals:   01/14/18 2020 01/15/18 0436  BP: (!) 126/91 128/82  Pulse: 89 90  Resp: 18 18  Temp: 98.5 F (36.9 C) 98.7 F (37.1 C)  SpO2: 100% 100%   Vitals:   01/14/18 1300 01/14/18 2020 01/15/18 0436 01/15/18 0438  BP: (!) 141/88 (!) 126/91 128/82   Pulse: 81 89 90   Resp: 16 18 18    Temp: 98.5 F (36.9 C) 98.5 F (36.9 C) 98.7 F (37.1 C)    TempSrc: Oral Oral Oral   SpO2: 100% 100% 100%   Weight:    46.9 kg (103 lb 6.3 oz)  Height:       General: Pt is a thin Cachectic Frail and chronically ill appearing AAM who is awake, not in acute distress Cardiovascular: RRR, S1/S2 +, no rubs, no gallops Respiratory: Diminished bilaterally, no wheezing, no rhonchi Abdominal: Soft, NT, ND, bowel sounds + Extremities: no edema, no cyanosis  The results of significant diagnostics from this hospitalization (including imaging, microbiology, ancillary and laboratory) are listed below for reference.    Microbiology: Recent Results (from the past 240 hour(s))  Acid Fast Smear (AFB)     Status: None   Collection Time: 01/05/18  8:30 PM  Result Value Ref Range Status   AFB Specimen Processing [id number]  Final    Comment: (NOTE) The specimen submitted does not meet the laboratory's criteria for acceptability. Refer to [facility] Directory of Services for specimen acceptability criteria. Notified: Vangie BickerSheila T. 01/13/2018-[place]    Acid Fast Smear NOT PERFORMED  Final    Comment: (NOTE) Test not performed Performed At: [facility] [place] [address] [place], [place] [id number] Jolene SchimkeNagendra Sanjai MD VQ:[id number]:[phone] Performed at [facility], [address] [place], [place] [address]   Acid Fast Culture with reflexed sensitivities     Status: None   Collection Time: 01/05/18  8:30 PM  Result Value Ref Range Status   Acid Fast Culture [id number]  Final    Comment: (NOTE) The specimen submitted does not meet the laboratory's criteria for acceptability. Refer to [facility] Directory of Services for specimen acceptability criteria. Notified: Vangie BickerSheila T. 01/13/2018-[place] Performed At: [facility] [place] [address] [place], [place] [id number] Jolene SchimkeNagendra Sanjai MD SW:[id number]:[phone] Performed at [facility], [address] [place], [place] [address]   CSF culture     Status: None   Collection Time: 01/06/18   1:29 PM  Result Value Ref Range Status   Specimen Description   Final    CSF Performed at [facility], [address] [place], [place] [address]    Special Requests   Final    NONE Performed at [facility], [address] [place], [place] [address]    Gram Stain   Final    WBC PRESENT, PREDOMINANTLY MONONUCLEAR YEAST PRESENT CYTOSPIN SMEAR CRITICAL RESULT CALLED TO, READ BACK BY AND  VERIFIED WITH: Alison Stalling. RN @1930  ON 02.20.19 BY COHEN,K Performed at [facility], [address] [place], [place] [address]    Culture   Final    NO GROWTH 3 DAYS Performed at [facility], [address], [place], [place] [address]    Report Status 01/10/2018 FINAL  Final  Fungus Culture With Stain     Status: None (Preliminary result)   Collection Time: 01/06/18  1:29 PM  Result Value Ref Range Status   Fungus Stain Final report  Final    Comment: (NOTE) Performed At: [facility] [address] [place], [place] [id number] Jolene Schimke MD [id number]:[id number]    Fungus (Mycology) Culture PENDING  Incomplete   Fungal Source CSF  Final    Comment: Performed at [facility], [address], [place], [place] [address]  Fungus Culture Result     Status: None   Collection Time: 01/06/18  1:29 PM  Result Value Ref Range Status   Result 1 Comment  Final    Comment: (NOTE) KOH/Calcofluor preparation:  no fungus observed. Performed At: [facility] [address] [place], [place] [id number] Jolene Schimke MD KG:[id number] Performed at [facility], [address], [place], [place] [address]   Urine Culture     Status: None   Collection Time: 01/06/18  3:29 PM  Result Value Ref Range Status   Specimen Description   Final    URINE, CATHETERIZED Performed at [facility], [address] [place], [place] [address]    Special Requests   Final    NONE Performed at [facility], [address] [place], [place] [address]    Culture   Final    NO GROWTH Performed at [facility], [address], [place], [place] [address]    Report Status 01/08/2018 FINAL  Final    Labs: BNP (last 3 results) No results for input(s): BNP in the last 8760 hours. Basic Metabolic Panel: Recent Labs  Lab 01/11/18 0310 01/12/18 0417 01/13/18 0647 01/14/18 0338 01/15/18 0858  NA 130* 130* 134* 134* 134*  K 4.7 5.0 5.1 5.1 4.7  CL 108 108 114* 114* 112*  CO2 14* 14* 14* 15* 15*  GLUCOSE 134* 88 88 90 118*  BUN 27* 34* 34* 30* 26*  CREATININE 1.18 1.55* 1.29* 1.05 0.96  CALCIUM 7.9* 8.4* 8.2* 8.3* 8.4*  MG 2.0 1.8 1.5* 1.7 1.6*  PHOS  --   --   --  3.8 3.5   Liver Function Tests: Recent Labs  Lab 01/10/18 0819 01/14/18 0338 01/15/18 0858  AST 84* 36 35  ALT 217* 81* 63  ALKPHOS 152* 236* 252*  BILITOT 1.1 0.5 0.5  PROT 7.5 7.3 6.9  ALBUMIN 2.3* 2.3* 2.3*   No results for input(s): LIPASE, AMYLASE in the last 168 hours. No results for input(s): AMMONIA in the last 168 hours. CBC: Recent Labs  Lab 01/11/18 0310 01/12/18 0417 01/13/18 0647 01/14/18 0338 01/15/18 0858  WBC 6.2 5.9 5.4 5.3 4.6  NEUTROABS  --   --   --  3.6 3.4  HGB 8.5* 8.4* 7.1* 8.9* 8.4*  HCT 26.2* 25.2* 21.1* 26.8* 26.2*  MCV 80.4 80.5 82.4 84.3 86.8  PLT 26* 45* 72* 92* 126*   Cardiac Enzymes: No results for input(s): CKTOTAL, CKMB, CKMBINDEX, TROPONINI in the last 168 hours. BNP: Invalid input(s): POCBNP CBG: Recent Labs  Lab 01/14/18 1656 01/14/18 2020 01/15/18 0001 01/15/18  0431 01/15/18 0751  GLUCAP 121* 101* 106* 91 88   D-Dimer No results for input(s): DDIMER in the last 72 hours. Hgb A1c Recent Labs    01/14/18 0338  HGBA1C 5.7*   Lipid Profile No results for input(s): CHOL, HDL, LDLCALC, TRIG, CHOLHDL, LDLDIRECT in the last 72 hours. Thyroid function studies No results for input(s): TSH, T4TOTAL, T3FREE, THYROIDAB in the last 72  hours.  Invalid input(s): FREET3 Anemia work up No results for input(s): VITAMINB12, FOLATE, FERRITIN, TIBC, IRON, RETICCTPCT in the last 72 hours. Urinalysis    Component Value Date/Time   COLORURINE YELLOW 01/04/2018 1910   APPEARANCEUR CLOUDY (A) 01/04/2018 1910   LABSPEC 1.017 01/04/2018 1910   PHURINE 7.0 01/04/2018 1910   GLUCOSEU NEGATIVE 01/04/2018 1910   HGBUR SMALL (A) 01/04/2018 1910   BILIRUBINUR NEGATIVE 01/04/2018 1910   KETONESUR NEGATIVE 01/04/2018 1910   PROTEINUR 100 (A) 01/04/2018 1910   NITRITE NEGATIVE 01/04/2018 1910   LEUKOCYTESUR LARGE (A) 01/04/2018 1910   Sepsis Labs Invalid input(s): PROCALCITONIN,  WBC,  LACTICIDVEN Microbiology Recent Results (from the past 240 hour(s))  Acid Fast Smear (AFB)     Status: None   Collection Time: 01/05/18  8:30 PM  Result Value Ref Range Status   AFB Specimen Processing [id number]  Final    Comment: (NOTE) The specimen submitted does not meet the laboratory's criteria for acceptability. Refer to [employer] [facility] for specimen acceptability criteria. Notified: Vangie Bicker. 01/13/2018-[place]pson    Acid Fast Smear NOT PERFORMED  Final    Comment: (NOTE) Test not performed Performed At: [facility] [address] [place], [place] [id number] Jolene Schimke MD [id number]:[id number] Performed at [facility], [address], [place], [place] [address]   Acid Fast Culture with reflexed sensitivities     Status: None   Collection Time: 01/05/18  8:30 PM  Result Value Ref Range Status   Acid Fast Culture [id number]  Final    Comment: (NOTE) The specimen submitted does not meet the laboratory's criteria for acceptability. Refer to [employer] for specimen acceptability criteria. Notified: Vangie Bicker. 01/13/2018-Simpson Performed At: [facility] [address] [place], [place] [id number] Jolene Schimke MD [id number] Performed at [facility], [address], [place], [place] [address]   CSF culture     Status: None   Collection Time: 01/06/18  1:29 PM  Result Value Ref Range Status   Specimen Description   Final    CSF Performed at [facility], [address], [place], [place] [address]    Special Requests   Final    NONE Performed at [facility], [address], [place], [place] [address]    Gram Stain   Final    WBC PRESENT, PREDOMINANTLY MONONUCLEAR YEAST PRESENT CYTOSPIN SMEAR CRITICAL RESULT CALLED TO, READ BACK BY AND VERIFIED WITH: Alison Stalling. RN @1930  ON 02.20.19 BY COHEN,K Performed at [facility], [address], [place], [place] [address]    Culture   Final    NO GROWTH 3 DAYS Performed at [facility], [address] [place], [place] [address]    Report Status 01/10/2018 FINAL  Final  Fungus Culture With Stain     Status: None (Preliminary result)   Collection Time: 01/06/18  1:29 PM  Result Value Ref Range Status   Fungus Stain Final report  Final    Comment: (NOTE) Performed At: [facility] [address] [place],  [place] [id number] Jolene Schimke MD WG:[id number]    Fungus (Mycology) Culture PENDING  Incomplete   Fungal Source CSF  Final    Comment: Performed at [facility], [address] [place], [place] [address]  Fungus Culture Result     Status: None   Collection Time: 01/06/18  1:29 PM  Result Value Ref Range Status   Result 1 Comment  Final    Comment: (NOTE) KOH/Calcofluor preparation:  no fungus observed. Performed At: [facility] [address] [place], [place] [id number] Jolene Schimke MD KG:[id number] Performed at [facility], [address] [place], [place] [address]   Urine Culture     Status: None   Collection Time: 01/06/18  3:29 PM  Result Value Ref Range Status   Specimen Description   Final    URINE, CATHETERIZED Performed at [facility], [address], [place], [place] [address]    Special Requests   Final    NONE Performed at [facility], [address], [place], [place] [address]    Culture   Final    NO GROWTH Performed at [facility], [address] [place], [place] [address]    Report Status 01/08/2018 FINAL  Final   Time coordinating discharge: 35 minutes  SIGNED:  Merlene Laughter, DO [facility] 01/15/2018, 11:41 AM Pager [phone]  If 7PM-7AM, please contact night-coverage [email or web address] Password [email or web address]

## 2018-01-14 NOTE — Progress Notes (Signed)
Physical Therapy Treatment Patient Details Name: Kenneth Rivas MRN: [id number] DOB: 12/08/1960 Today's Date: 01/14/2018    History of Present Illness Kenneth Rivas is a 57 y.o. 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  SNF was recommended on discharge but pt refused.  He went to live at a boarding house with a roommmate and apparently stopped taking his meds.  Home health nurses visited him on Friday 2/15 and recommended that he come to the ED for evaluation due to generalized weakness but he refused.  On 2/18, case worker went to pickup pt for an appointment and found him with profound weakness and AMS.  He was subsequently brought to [facility] ED for further evaluation and management.  Patient intubated 2/18-2/20.    PT Comments    Pt assisted to recliner however pt with frequent loose BMs so assisted back to bed for pericare.  NT assisted during session.  Pt remains mod-max assist for mobility so continue to recommend SNF upon d/c.   Follow Up Recommendations  SNF;Supervision/Assistance - 24 hour     Equipment Recommendations  None recommended by PT    Recommendations for Other Services       Precautions / Restrictions Precautions Precautions: Fall Precaution Comments: incontinent of bowels Restrictions Weight Bearing Restrictions: No    Mobility  Bed Mobility Overal bed mobility: Needs Assistance Bed Mobility: Rolling;Supine to Sit;Sit to Supine Rolling: Max assist   Supine to sit: Max assist Sit to supine: Mod assist   General bed mobility comments: requires encouragement to initiate, pt attempting to assist using upper body, requires assist for lower body, moans in pain with mobility, multiple rolling for pericare (pt had BM presession and after  transferring; NT assisted )  Transfers Overall transfer level: Needs assistance Equipment used: 2 person hand held assist Transfers: Squat Pivot Transfers     Squat pivot transfers: Mod assist;+2 safety/equipment;+2 physical assistance     General transfer comment: pt declined using RW however needed UE support so provided 2 HHA, blocked knees for safety, pt unable to stand fully erect, squat pivot assist to recliner however pt had BM immediately upon reaching seat so assisted back to bed for pericare  Ambulation/Gait                 Stairs            Wheelchair Mobility    Modified Rankin (Stroke Patients Only)       Balance                                            Cognition Arousal/Alertness: Awake/alert Behavior During Therapy: WFL for tasks assessed/performed Overall Cognitive Status: No family/caregiver present to determine baseline cognitive functioning                         Following Commands: Follows one step commands inconsistently;Follows one step commands with increased time     Problem Solving: Decreased initiation General Comments: HOH, requires many cues for safety      Exercises      General Comments        Pertinent Vitals/Pain Pain Assessment: Faces  Faces Pain Scale: Hurts even more Pain Location: "all over" moans with mobility and pericare Pain Descriptors / Indicators: Discomfort;Sore;Tender;Grimacing;Moaning Pain Intervention(s): Monitored during session;Repositioned    Home Living                      Prior Function            PT Goals (current goals can now be found in the care plan section) Progress towards PT goals: Progressing toward goals    Frequency    Min 3X/week      PT Plan Current plan remains appropriate    Co-evaluation              AM-PAC PT "6 Clicks" Daily Activity  Outcome Measure  Difficulty turning over in bed (including adjusting  bedclothes, sheets and blankets)?: Unable Difficulty moving from lying on back to sitting on the side of the bed? : Unable Difficulty sitting down on and standing up from a chair with arms (e.g., wheelchair, bedside commode, etc,.)?: Unable Help needed moving to and from a bed to chair (including a wheelchair)?: Total Help needed walking in hospital room?: Total Help needed climbing 3-5 steps with a railing? : Total 6 Click Score: 6    End of Session   Activity Tolerance: Patient limited by fatigue Patient left: in bed;with call bell/phone within reach;with nursing/sitter in room   PT Visit Diagnosis: Other abnormalities of gait and mobility (R26.89);Muscle weakness (generalized) (M62.81)     Time: [id number] PT Time Calculation (min) (ACUTE ONLY): 28 min  Charges:  $Therapeutic Activity: 23-37 mins                    G Codes:       Zenovia JarredKati Gennavieve Huq, PT, DPT 01/14/2018 Pager: [phone]  Maida SaleLEMYRE,KATHrine E 01/14/2018, 1:20 PM

## 2018-01-14 NOTE — Progress Notes (Addendum)
CSW following for assistance with disposition- planning for SNF at [place]. Current barrier: no bed offers. [facility] has visited pt and working toward responding to referral today. Issue with pt's medications being provided/covered there.  Will follow.  Ilean SkillMeghan Rosie Golson, MSW, LCSW Clinical Social Work 01/14/2018 [phone]  16:23- [facility] work toward verifying pt's medications could be provided. No bed offer as of yet. Will follow up tomorrow,

## 2018-01-14 NOTE — Progress Notes (Signed)
  Speech Language Pathology Treatment: Dysphagia  Patient Details Name: Delvis Kau MRN: [id number] DOB: 1961/07/13 Today's Date: 01/14/2018 Time: 1117-3567 SLP Time Calculation (min) (ACUTE ONLY): 8 min  Assessment / Plan / Recommendation Clinical Impression  Pt is  Tolerating dys 2 meals independently, consuming 100%of liquids on am tray and 50% of solids including regular texture eggs without oral pocketing. Soften texture remains appropriate for energy conservation and to facilitate oral transit. Pt reports he is happy with meals and refused upgraded textures. Continue current diet, SLP will sign off.   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 Intubation 2/18-2/20 patient is intubated and sedated. CXR No acute pulmonary disease identified. MBS 06/28/17 acute, continue NPO and repeat MBS 8/17 with improvements, pharyngeal phase normal except for premature spill, no penetration or aspiration and D2, thin recommended.       SLP Plan  All goals met       Recommendations  Diet recommendations: Dysphagia 2 (fine chop);Thin liquid Liquids provided via: Cup;Straw Medication Administration: Whole meds with liquid Supervision: Patient able to self feed;Intermittent supervision to cue for compensatory strategies Compensations: Minimize environmental distractions;Slow rate;Small sips/bites Postural Changes and/or Swallow Maneuvers: Seated upright 90 degrees;Upright 30-60 min after meal                Oral Care Recommendations: Oral care BID Follow up Recommendations: None Plan: All goals met       GO                Clarke Peretz, Katherene Ponto 01/14/2018, 8:22 AM

## 2018-01-15 DIAGNOSIS — Z01818 Encounter for other preprocedural examination: Secondary | ICD-10-CM

## 2018-01-15 LAB — PHOSPHORUS: Phosphorus: 3.5 mg/dL (ref 2.5–4.6)

## 2018-01-15 LAB — CBC WITH DIFFERENTIAL/PLATELET
Basophils Absolute: 0 10*3/uL (ref 0.0–0.1)
Basophils Relative: 1 %
EOS ABS: 0.1 10*3/uL (ref 0.0–0.7)
Eosinophils Relative: 3 %
HCT: 26.2 % — ABNORMAL LOW (ref 39.0–52.0)
HEMOGLOBIN: 8.4 g/dL — AB (ref 13.0–17.0)
LYMPHS PCT: 10 %
Lymphs Abs: 0.5 10*3/uL — ABNORMAL LOW (ref 0.7–4.0)
MCH: 27.8 pg (ref 26.0–34.0)
MCHC: 32.1 g/dL (ref 30.0–36.0)
MCV: 86.8 fL (ref 78.0–100.0)
MONO ABS: 0.6 10*3/uL (ref 0.1–1.0)
MONOS PCT: 13 %
NEUTROS ABS: 3.4 10*3/uL (ref 1.7–7.7)
Neutrophils Relative %: 73 %
PLATELETS: 126 10*3/uL — AB (ref 150–400)
RBC: 3.02 MIL/uL — AB (ref 4.22–5.81)
RDW: 18.8 % — ABNORMAL HIGH (ref 11.5–15.5)
WBC: 4.6 10*3/uL (ref 4.0–10.5)
nRBC: 1 /100 WBC — ABNORMAL HIGH

## 2018-01-15 LAB — COMPREHENSIVE METABOLIC PANEL
ALBUMIN: 2.3 g/dL — AB (ref 3.5–5.0)
ALK PHOS: 252 U/L — AB (ref 38–126)
ALT: 63 U/L (ref 17–63)
ANION GAP: 7 (ref 5–15)
AST: 35 U/L (ref 15–41)
BUN: 26 mg/dL — ABNORMAL HIGH (ref 6–20)
CHLORIDE: 112 mmol/L — AB (ref 101–111)
CO2: 15 mmol/L — AB (ref 22–32)
Calcium: 8.4 mg/dL — ABNORMAL LOW (ref 8.9–10.3)
Creatinine, Ser: 0.96 mg/dL (ref 0.61–1.24)
GFR calc Af Amer: >60 mL/min (ref >60)
GFR calc non Af Amer: >60 mL/min (ref >60)
GLUCOSE: 118 mg/dL — AB (ref 65–99)
POTASSIUM: 4.7 mmol/L (ref 3.5–5.1)
SODIUM: 134 mmol/L — AB (ref 135–145)
Total Bilirubin: 0.5 mg/dL (ref 0.3–1.2)
Total Protein: 6.9 g/dL (ref 6.5–8.1)

## 2018-01-15 LAB — GLUCOSE, CAPILLARY
GLUCOSE-CAPILLARY: 88 mg/dL (ref 65–99)
GLUCOSE-CAPILLARY: 98 mg/dL (ref 65–99)
GLUCOSE-CAPILLARY: 107 mg/dL — AB (ref 65–99)
GLUCOSE-CAPILLARY: 109 mg/dL — AB (ref 65–99)
Glucose-Capillary: 91 mg/dL (ref 65–99)

## 2018-01-15 LAB — MAGNESIUM: Magnesium: 1.6 mg/dL — ABNORMAL LOW (ref 1.7–2.4)

## 2018-01-15 MED ORDER — MAGNESIUM SULFATE 2 GM/50ML IV SOLN
2.0000 g | Freq: Once | INTRAVENOUS | Status: AC
  Administered 2018-01-15: 2 g via INTRAVENOUS
  Filled 2018-01-15: qty 50

## 2018-01-15 NOTE — Progress Notes (Signed)
CSW contacted by [facility] staff member Wallie CharShaquenia and informed that patient has been accepted to their SNF, room 104. Staff requested that patient's transportation be arranged for 3PM.   CSW spoke with patient's sister and provided update.  CSW updated patient's attending MD.  CSW will continue to follow and assist with discharge planning.  Celso SickleKimberly Shaquetta Arcos, [place] Clinical Social Worker [facility] Cell#: [phone]

## 2018-01-15 NOTE — Clinical Social Work Placement (Signed)
Patient received and [facility]. Facility aware of patient's discharge and confirmed bed offer. [facility] contacted and arranged for 3PM per SNF request, patient's family notified. Patient's RN can call report to [phone] Room 104, packet complete. CSW signing off, no other needs identified at this time.  CLINICAL SOCIAL WORK PLACEMENT  NOTE  Date:  01/15/2018  Patient Details  Name: Kenneth Rivas MRN: [id number] Date of Birth: 02-Dec-1960  Clinical Social Work is seeking post-discharge placement for this patient at the Skilled  Nursing Facility level of care (*CSW will initial, date and re-position this form in  chart as items are completed):  Yes   Patient/family provided with [place] Clinical Social Work Department's list of facilities offering this level of care within the geographic area requested by the patient (or if unable, by the patient's family).  Yes   Patient/family informed of their freedom to choose among providers that offer the needed level of care, that participate in Medicare, Medicaid or managed care program needed by the patient, have an available bed and are willing to accept the patient.  Yes   Patient/family informed of [facility]'s ownership interest in [facility] and [facility], as well as of the fact that they are under no obligation to receive care at these facilities.  PASRR submitted to EDS on       PASRR number received on       Existing PASRR number confirmed on 01/12/18     FL2 transmitted to all facilities in geographic area requested by pt/family on 01/12/18     FL2 transmitted to all facilities within larger geographic area on       Patient informed that his/her managed care company has contracts with or will negotiate with certain facilities, including the following:        Yes   Patient/family informed of bed offers received.  [facility]     Physician recommends and  patient chooses bed at      [facility] on 01/15/18.  Patient to be transferred to facility by PT[facility]     Patient family notified on 01/15/18 of transfer.  Name of family member notified:  Eilleen Kempfheresa Moore     PHYSICIAN       Additional Comment:    _______________________________________________ Antionette PolesKimberly L Anber Mckiver, LCSW 01/15/2018, 10:44 AM

## 2018-01-15 NOTE — Progress Notes (Signed)
Nutrition Follow-up  DOCUMENTATION CODES:   Severe malnutrition in context of chronic illness, Underweight  INTERVENTION:    Ensure Enlive once/day, this supplement provides 350 kcal and 20 grams of protein.  Hormel Shake once/day at 2:00 PM, this supplement provides 520 kcal and 22 grams of protein.   Multivitamin with minerals.  NUTRITION DIAGNOSIS:   Severe Malnutrition related to chronic illness(AIDS) as evidenced by percent weight loss, energy intake < 75% for > 7 days, moderate fat depletion, severe muscle depletion.  Ongoing  GOAL:   Patient will meet greater than or equal to 90% of their needs  Meeting  MONITOR:   PO intake, Supplement acceptance, Weight trends, Labs  REASON FOR ASSESSMENT:   Consult Enteral/tube feeding initiation and management  ASSESSMENT:   57 y.o. male with PMH including AIDS, cocaine abuse and hepatitis B.   Pt's appetite remains fair with meal completions charted as 75-100% for his last eight meals. Pt had fish, potatoes, carrots, and chocolate pudding for lunch. Weight noted to be decreased by 4 lb since last RD visit (107 to 103 lb). Pt to be discharged to SNF today. RD to sign off.   Medications reviewed and include: SSI, megace, MVI with minerals Labs reviewed: NA 134 (L) Mg 1.6 (L)   Diet Order:  DIET DYS 2 Room service appropriate? Yes; Fluid consistency: Thin Diet - low sodium heart healthy  EDUCATION NEEDS:   Not appropriate for education at this time  Skin:  Skin Assessment: Reviewed RN Assessment  Last BM:  01/15/18  Height:   Ht Readings from Last 1 Encounters:  01/04/18 5\' 6"  (1.676 m)    Weight:   Wt Readings from Last 1 Encounters:  01/15/18 103 lb 6.3 oz (46.9 kg)    Ideal Body Weight:  64.5 kg  BMI:  Body mass index is 16.69 kg/m.  Estimated Nutritional Needs:   Kcal:  1710-1955 (35-40 kcal/kg)  Protein:  73-88 grams (1.5-1.8 grams/kg)  Fluid:  >/= 1.8 L/day    Vanessa Kickarly Shyler Holzman RD,  LDN Clinical Nutrition Pager # - [phone]

## 2018-01-15 NOTE — Progress Notes (Signed)
Patient picked up by PTAR. External catheter removed before transport.

## 2018-01-18 ENCOUNTER — Encounter: Payer: Self-pay | Admitting: Adult Health

## 2018-01-18 ENCOUNTER — Non-Acute Institutional Stay (SKILLED_NURSING_FACILITY): Payer: Medicaid Other | Admitting: Adult Health

## 2018-01-18 DIAGNOSIS — E43 Unspecified severe protein-calorie malnutrition: Secondary | ICD-10-CM

## 2018-01-18 DIAGNOSIS — I420 Dilated cardiomyopathy: Secondary | ICD-10-CM

## 2018-01-18 DIAGNOSIS — B20 Human immunodeficiency virus [HIV] disease: Secondary | ICD-10-CM

## 2018-01-18 DIAGNOSIS — R64 Cachexia: Secondary | ICD-10-CM

## 2018-01-18 DIAGNOSIS — D649 Anemia, unspecified: Secondary | ICD-10-CM | POA: Diagnosis not present

## 2018-01-18 DIAGNOSIS — I10 Essential (primary) hypertension: Secondary | ICD-10-CM | POA: Diagnosis not present

## 2018-01-18 DIAGNOSIS — B451 Cerebral cryptococcosis: Secondary | ICD-10-CM

## 2018-01-18 LAB — FUNGUS CULTURE WITH STAIN

## 2018-01-18 LAB — FUNGUS CULTURE RESULT

## 2018-01-18 LAB — FUNGAL ORGANISM REFLEX

## 2018-01-18 NOTE — Progress Notes (Signed)
Location:   [facility] Room Number: 104 B Place of Service:  SNF (31)   CODE STATUS: Full Code  No Known Allergies  Chief Complaint  Patient presents with  . Hospitalization Follow-up    Hospital follow up    HPI:   he has a history of poor compliance with his medication; has a history of alcohol; tobacco and cocaine abuse. He was deemed to be without capacity by psyhc on 11-26-17.  He was taken to the ED for altered mental status; he did require intubation. He was treated for severe persistent cryptococcal meningocephalitis with amphotericin and  flucytosine and is now on diflucan.he did equire one unit PRBCs for his anemia. He will need to start back on his anti-retroviral therapy and will need to follow up with I/D. He is here for short term rehab. More than likely this does represent a long term placement for him in either ALF or SNF. He denies any back pain; he does have chronic leg pain; no changes in appetite; no change in appetite. He will followed for his chronic illnesses including: cardiomyopathy; cryptococcal meningoencephalitis; and anemia. There are no nursing concerns at this time.   Past Medical History:  Diagnosis Date  . Acute renal insufficiency 01/05/2018  . AIDS ([place])   . Anemia   . Anxiety   . Cellulitis of right hand 06/23/2017  . Hepatitis B   . HIV disease ([place])   . Hypertension   . Immune deficiency disorder [facility])     Past Surgical History:  Procedure Laterality Date  . HERNIA REPAIR      Social History   Socioeconomic History  . Marital status: Married    Spouse name: Not on file  . Number of children: Not on file  . Years of education: Not on file  . Highest education level: Not on file  Social Needs  . Financial resource strain: Not on file  . Food insecurity - worry: Not on file  . Food insecurity - inability: Not on file  . Transportation needs - medical: Not on file  . Transportation needs - non-medical: Not on file    Occupational History  . Not on file  Tobacco Use  . Smoking status: Current Some Day Smoker    Packs/day: 0.30    Types: Cigarettes  . Smokeless tobacco: Never Used  . Tobacco comment: pt. given quit line information  Substance and Sexual Activity  . Alcohol use: Yes    Alcohol/week: 2.0 oz    Types: 4 Standard drinks or equivalent per week    Comment: beer  . Drug use: Yes    Types: Cocaine    Comment: crack- last use 03/2012. no h/o IVDU, other illicits  . Sexual activity: Yes    Partners: Male    Comment: patient declined condoms  Other Topics Concern  . Not on file  Social History Narrative  . Not on file   Family History  Problem Relation Age of Onset  . Hypertension Mother       VITAL SIGNS BP 132/64   Pulse 85   Temp 98.9 F (37.2 C)   Resp 18   Ht '5\' 6"'$  (1.676 m)   Wt 101 lb 11.2 oz (46.1 kg)   SpO2 94%   BMI 16.41 kg/m   Outpatient Encounter Medications as of 01/18/2018  Medication Sig  . acetaminophen (TYLENOL) 325 MG tablet Take 2 tablets (650 mg total) by mouth every 4 (four) hours as needed  for mild pain (temp > 101.5).  Marland Kitchen azithromycin (ZITHROMAX) 600 MG tablet Take 2 tablets (1,200 mg total) by mouth once a week.  . ENSURE (ENSURE) Take 240 mLs by mouth 3 (three) times daily between meals.  . fluconazole (DIFLUCAN) 200 MG tablet Take 2 tablets (400 mg total) by mouth daily. After 8 Weeks change to po 200 mg Daily  . ibuprofen (ADVIL,MOTRIN) 200 MG tablet Take 200-600 mg by mouth every 6 (six) hours as needed for moderate pain.   . megestrol (MEGACE) 400 MG/10ML suspension Take 10 mLs (400 mg total) by mouth daily.  . metoprolol tartrate (LOPRESSOR) 25 MG tablet Take 1 tablet (25 mg total) by mouth 2 (two) times daily.  . Multiple Vitamin (MULTIVITAMIN WITH MINERALS) TABS tablet Take 1 tablet by mouth daily.  . pantoprazole (PROTONIX) 40 MG tablet Take 1 tablet (40 mg total) by mouth daily.  . polyvinyl alcohol (LIQUIFILM TEARS) 1.4 % ophthalmic  solution Place 1 drop into both eyes at bedtime.  . sulfamethoxazole-trimethoprim (BACTRIM DS,SEPTRA DS) 800-160 MG tablet Take 1 tablet by mouth 3 (three) times a week.  . [DISCONTINUED] feeding supplement, ENSURE ENLIVE, (ENSURE ENLIVE) LIQD Take 237 mLs by mouth 3 (three) times daily. (Patient not taking: Reported on 01/18/2018)   No facility-administered encounter medications on file as of 01/18/2018.      SIGNIFICANT DIAGNOSTIC EXAMS  TODAY:  01-04-18: chest x-ray: Satisfactory endotracheal tube position with the tip 5.4 cm above the carina. Hyperinflated lungs.  01-04-18: ct of head: Chronic brain changes as described above. No acute findings   01-05-18: EEG: This is an essentially normal EEG in the awake and drowsy states.  Suppression of voltages can be an indicator of diffuse cortical dysfunction , as can be seen with fungal meningitis.     01-05-18: renal ultrasound: 1. Mild heterogeneity and renal parenchymal echogenicity bilaterally as noted above. Possibilities might include glomerular nephritis or pyelonephritis. No definite perirenal fluid collection. Correlate with urine analysis in determining whether further workup is warranted. 2. No stones or hydronephrosis identified.  01-07-18: chest x-ray:1.  Interim extubation removal of NG tube. 2.  No acute pulmonary disease identified.   LABS REVIEWED: TODAY:   01-04-18: wbc 10.3; hgb 12.6; hct 38.3; mcv 81.5; plt 321; glucose 351; bun 122; creat 5.33; k+ 5.1; na++ 148; ca 8.3; mag 3.5; phos 9.6; urine culture: multiple species  01-05-18: wbc 7.9; hgb 8.3; hct 24.7 mcv 86.7; plt 168; glucose 100; bun 132; creat 5.14; k+ 5.2; na++ 148; ca 7.8  01-06-18: wbc 5.2; hgb 6.4; hct 18.5; mcv 80.8; plt 139; glucose 118; bun 107;creat 4.47; k+ 4.2; na++ 146; ca 6.1; ast 759; alt 503; alk phos 83; albumin 1.8; urine culture no growth; hepatitis panel: neg; Crytop Ag: +; titer> 2560 01-07-18: wbc 5.9; hgb 9.2; hct 28.8; mcv 81.2; plt 125; glucose 92;  bun 88; creat 3.39; k+ 2.9; na++ 152; ca 7.9; mag 1.8  01-08-18: wbc 3.9; hgb 10.1; hct 32.9 ;mcv 85.0; plt 63; glucose 146; bun 45; creat 2.22; k+ 4.5; na++ 136; ca 7.7; ast 290; alt 432; alk phos 114; albumin 2.1 01-10-18: glucose 120; bun 28; creat 1.19; k+ 4.4; na++ 128; ca 8.0; ast 94; alt 217; alk phos 152 albumin 2.3; mag 1.6 01-12-18: wbc 5.9; hgb 8.4; hct 25.2; mcv 80.5; plt 45; glucose 88; bun 34; creat 1.55; k+ 5.0; na++ 130; ca 8.4; mag 1.8 01-14-18: wbc 5.3; hgb 8.9; hct 26.8; mcv 84.3; plt 92; creat 90; bun 30; creat  1.05; k+ 5.1; na++ 134; ca 8.3; ast 86; alt 81; alk phos 236; albumin 2.3; mag 1.7 01-15-18: wbc 4.6; hgb 8.4; hct 26.2; mcv 86.8; plt 126; glucose 118; bun 26; creat 0.96; k+ 4.7; na++ 134; ca 8.4; alk phos 252; albumin 2.3; mag 1.6; phos 3.5     Review of Systems  Constitutional: Negative for malaise/fatigue.  Respiratory: Negative for cough and shortness of breath.   Cardiovascular: Negative for chest pain, palpitations and leg swelling.  Gastrointestinal: Negative for abdominal pain, constipation and heartburn.  Musculoskeletal: Positive for myalgias. Negative for back pain and joint pain.       Has leg pain; is chronic Has right leg weakness   Skin: Negative.   Neurological: Negative for dizziness.  Psychiatric/Behavioral: The patient is not nervous/anxious.     Physical Exam  Constitutional: He is oriented to person, place, and time. No distress.  Frail  Has muscle wasting  Neck: No thyromegaly present.  Cardiovascular: Normal rate, regular rhythm and intact distal pulses.  Murmur heard. 1/6  Pulmonary/Chest: Effort normal and breath sounds normal. No respiratory distress.  Abdominal: Soft. Bowel sounds are normal. He exhibits no distension. There is no tenderness.  Musculoskeletal: He exhibits no edema.  Is able to move all extremities   Lymphadenopathy:    He has no cervical adenopathy.  Neurological: He is alert and oriented to person, place, and time.   Skin: Skin is warm and dry. He is not diaphoretic.  Psychiatric: He has a normal mood and affect.    ASSESSMENT/ PLAN:  TODAY:   1. Anemia: stable stable hgb 8.4; is status post 1 units prbcs will monitor  2. Cachexia associated with AIDS: protein calorie malnutrition, severe: without change: weight is 102 pounds; albumin is 2.3 will continue ensure three times daily and will continue megace 400 mg daily before lunch to help with appetite  3. AIDS: without change: CD4 T cell <10 (11-28-16-9) is on diflucan 400 mg daily for 56 more days then will start 200 mg daily ; will continue zithromax 1200 mg weekly and septra ds three times weekly   4. Cardiomyopathy, dilated: has essential hypertension EF 30-35% (12-05-17): will continue lopressor 25 mg twice daily   5. GERD without esophagitis: stable will continue protonix 40 mg daily  6. 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     Will check cbc; cmp mag phos  MD is aware of resident's narcotic use and is in agreement with current plan of care. We will attempt to wean resident as apropriate   Ok Edwards NP [facility]  Contact [phone] Monday through Friday 8am- 5pm  After hours call [phone]

## 2018-01-20 ENCOUNTER — Encounter: Payer: Self-pay | Admitting: Adult Health

## 2018-01-20 ENCOUNTER — Non-Acute Institutional Stay (SKILLED_NURSING_FACILITY): Payer: Medicaid Other | Admitting: Adult Health

## 2018-01-20 DIAGNOSIS — B20 Human immunodeficiency virus [HIV] disease: Secondary | ICD-10-CM | POA: Diagnosis not present

## 2018-01-20 DIAGNOSIS — B451 Cerebral cryptococcosis: Secondary | ICD-10-CM | POA: Diagnosis not present

## 2018-01-20 DIAGNOSIS — R030 Elevated blood-pressure reading, without diagnosis of hypertension: Secondary | ICD-10-CM | POA: Diagnosis not present

## 2018-01-20 NOTE — Progress Notes (Signed)
Location:   [facility] Room Number: 104 B Place of Service:  SNF (31)   CODE STATUS: Full Code (Most form updated 01/20/18)  No Known Allergies  Chief Complaint  Patient presents with  . Acute Visit    Care Plan Meeting    HPI:  We have come together for his routine care plan meeting.his sister was present via conference call; he is present and is able to participate in the care plan.  The goals of care are for him to be able to return to an independent living apartment in the future; however this is a long distance plan. This will be a long term placement for him at this time. We did discuss his medications; therapy goals. He denies any back or joint pain; no changes in appetite; no complaints of insomnia.    Past Medical History:  Diagnosis Date  . Acute renal insufficiency 01/05/2018  . AIDS ([place])   . Anemia   . Anxiety   . Cellulitis of right hand 06/23/2017  . Hepatitis B   . HIV disease ([place])   . Hypertension   . Immune deficiency disorder [facility])     Past Surgical History:  Procedure Laterality Date  . HERNIA REPAIR      Social History   Socioeconomic History  . Marital status: Married    Spouse name: Not on file  . Number of children: Not on file  . Years of education: Not on file  . Highest education level: Not on file  Social Needs  . Financial resource strain: Not on file  . Food insecurity - worry: Not on file  . Food insecurity - inability: Not on file  . Transportation needs - medical: Not on file  . Transportation needs - non-medical: Not on file  Occupational History  . Not on file  Tobacco Use  . Smoking status: Current Some Day Smoker    Packs/day: 0.30    Types: Cigarettes  . Smokeless tobacco: Never Used  . Tobacco comment: pt. given quit line information  Substance and Sexual Activity  . Alcohol use: Yes    Alcohol/week: 2.0 oz    Types: 4 Standard drinks or equivalent per week    Comment: beer  . Drug use: Yes   Types: Cocaine    Comment: crack- last use 03/2012. no h/o IVDU, other illicits  . Sexual activity: Yes    Partners: Male    Comment: patient declined condoms  Other Topics Concern  . Not on file  Social History Narrative  . Not on file   Family History  Problem Relation Age of Onset  . Hypertension Mother       VITAL SIGNS BP 140/78   Pulse 85   Temp 98.9 F (37.2 C)   Resp 18   Ht _0  (1.676 m)   Wt 101 lb 3.2 oz (45.9 kg)   SpO2 94%   BMI 16.33 kg/m   Outpatient Encounter Medications as of 01/20/2018  Medication Sig  . acetaminophen (TYLENOL) 325 MG tablet Take 2 tablets (650 mg total) by mouth every 4 (four) hours as needed for mild pain (temp > 101.5).  Marland Kitchen azithromycin (ZITHROMAX) 600 MG tablet Take 2 tablets (1,200 mg total) by mouth once a week.  . ENSURE (ENSURE) Take 240 mLs by mouth 3 (three) times daily between meals.  . fluconazole (DIFLUCAN) 200 MG tablet Take 2 tablets (400 mg total) by mouth daily. After 8 Weeks change to po 200  mg Daily  . ibuprofen (ADVIL,MOTRIN) 200 MG tablet Take 400 mg by mouth every 6 (six) hours as needed for moderate pain.   . megestrol (MEGACE) 400 MG/10ML suspension Take 10 mLs (400 mg total) by mouth daily.  . metoprolol tartrate (LOPRESSOR) 25 MG tablet Take 1 tablet (25 mg total) by mouth 2 (two) times daily.  . Multiple Vitamin (MULTIVITAMIN WITH MINERALS) TABS tablet Take 1 tablet by mouth daily.  . Nutritional Supplements (NUTRITIONAL SUPPLEMENT PO) NAS (No added Salt) diet -  Mechanical Soft Texture, thin Liquids consistency  . pantoprazole (PROTONIX) 40 MG tablet Take 1 tablet (40 mg total) by mouth daily.  . polyvinyl alcohol (LIQUIFILM TEARS) 1.4 % ophthalmic solution Place 1 drop into both eyes at bedtime.  . sulfamethoxazole-trimethoprim (BACTRIM DS,SEPTRA DS) 800-160 MG tablet Take 1 tablet by mouth 3 (three) times a week.   No facility-administered encounter medications on file as of 01/20/2018.      SIGNIFICANT  DIAGNOSTIC EXAMS  PREVIOUS:  01-04-18: chest x-ray: Satisfactory endotracheal tube position with the tip 5.4 cm above the carina. Hyperinflated lungs.  01-04-18: ct of head: Chronic brain changes as described above. No acute findings   01-05-18: EEG: This is an essentially normal EEG in the awake and drowsy states.  Suppression of voltages can be an indicator of diffuse cortical dysfunction , as can be seen with fungal meningitis.     01-05-18: renal ultrasound: 1. Mild heterogeneity and renal parenchymal echogenicity bilaterally as noted above. Possibilities might include glomerular nephritis or pyelonephritis. No definite perirenal fluid collection. Correlate with urine analysis in determining whether further workup is warranted. 2. No stones or hydronephrosis identified.  01-07-18: chest x-ray:1.  Interim extubation removal of NG tube. 2.  No acute pulmonary disease identified.   NO NEW EXAMS   LABS REVIEWED: PREVIOUS:   01-04-18: wbc 10.3; hgb 12.6; hct 38.3; mcv 81.5; plt 321; glucose 351; bun 122; creat 5.33; k+ 5.1; na++ 148; ca 8.3; mag 3.5; phos 9.6; urine culture: multiple species  01-05-18: wbc 7.9; hgb 8.3; hct 24.7 mcv 86.7; plt 168; glucose 100; bun 132; creat 5.14; k+ 5.2; na++ 148; ca 7.8  01-06-18: wbc 5.2; hgb 6.4; hct 18.5; mcv 80.8; plt 139; glucose 118; bun 107;creat 4.47; k+ 4.2; na++ 146; ca 6.1; ast 759; alt 503; alk phos 83; albumin 1.8; urine culture no growth; hepatitis panel: neg; Crytop Ag: +; titer> 2560 01-07-18: wbc 5.9; hgb 9.2; hct 28.8; mcv 81.2; plt 125; glucose 92; bun 88; creat 3.39; k+ 2.9; na++ 152; ca 7.9; mag 1.8  01-08-18: wbc 3.9; hgb 10.1; hct 32.9 ;mcv 85.0; plt 63; glucose 146; bun 45; creat 2.22; k+ 4.5; na++ 136; ca 7.7; ast 290; alt 432; alk phos 114; albumin 2.1 01-10-18: glucose 120; bun 28; creat 1.19; k+ 4.4; na++ 128; ca 8.0; ast 94; alt 217; alk phos 152 albumin 2.3; mag 1.6 01-12-18: wbc 5.9; hgb 8.4; hct 25.2; mcv 80.5; plt 45; glucose 88; bun  34; creat 1.55; k+ 5.0; na++ 130; ca 8.4; mag 1.8 01-14-18: wbc 5.3; hgb 8.9; hct 26.8; mcv 84.3; plt 92; creat 90; bun 30; creat 1.05; k+ 5.1; na++ 134; ca 8.3; ast 86; alt 81; alk phos 236; albumin 2.3; mag 1.7 01-15-18: wbc 4.6; hgb 8.4; hct 26.2; mcv 86.8; plt 126; glucose 118; bun 26; creat 0.96; k+ 4.7; na++ 134; ca 8.4; alk phos 252; albumin 2.3; mag 1.6; phos 3.5   NO NEW LABS.     Review of Systems  Constitutional: Negative for malaise/fatigue.  Respiratory: Negative for cough and shortness of breath.   Cardiovascular: Negative for chest pain, palpitations and leg swelling.  Gastrointestinal: Negative for abdominal pain, constipation and heartburn.  Musculoskeletal: Negative for back pain, joint pain and myalgias.  Skin: Negative.   Neurological: Negative for dizziness.  Psychiatric/Behavioral: The patient is not nervous/anxious.     Physical Exam  Constitutional: He is oriented to person, place, and time. No distress.  Frail Muscle wasting present   Neck: No thyromegaly present.  Cardiovascular: Normal rate, regular rhythm and intact distal pulses.  Murmur heard. 1/6  Pulmonary/Chest: Effort normal and breath sounds normal. No respiratory distress.  Abdominal: Soft. Bowel sounds are normal. He exhibits no distension. There is no tenderness.  Musculoskeletal: Normal range of motion. He exhibits no edema.  Lymphadenopathy:    He has no cervical adenopathy.  Neurological: He is alert and oriented to person, place, and time.  Skin: Skin is warm and dry. He is not diaphoretic.  Psychiatric: He has a normal mood and affect.    ASSESSMENT/ PLAN:  TODAY:   1. 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 AIDS:   3. 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     Will continue his current medications Will continue therapy as directed Will continue his current plan of care  Time spent with patient 45 minutes; 20 minutes  spent with advanced directives; did discuss his medications; medical status; therapy goals of care and expectations for future care. We did have a prolonged discussion regarding his advanced directives. The most form was filled out: DNR: NO TUBE FEEDING.    MD is aware of resident's narcotic use and is in agreement with current plan of care. We will attempt to wean resident as apropriate    Ok Edwards NP [facility]  Contact [phone] Monday through Friday 8am- 5pm  After hours call [phone]

## 2018-01-21 ENCOUNTER — Encounter: Payer: Self-pay | Admitting: Internal Medicine

## 2018-01-21 ENCOUNTER — Non-Acute Institutional Stay (SKILLED_NURSING_FACILITY): Payer: Medicaid Other | Admitting: Internal Medicine

## 2018-01-21 DIAGNOSIS — B451 Cerebral cryptococcosis: Secondary | ICD-10-CM

## 2018-01-21 DIAGNOSIS — R627 Adult failure to thrive: Secondary | ICD-10-CM | POA: Diagnosis not present

## 2018-01-21 DIAGNOSIS — R5381 Other malaise: Secondary | ICD-10-CM | POA: Diagnosis not present

## 2018-01-21 DIAGNOSIS — B20 Human immunodeficiency virus [HIV] disease: Secondary | ICD-10-CM | POA: Diagnosis not present

## 2018-01-21 DIAGNOSIS — E88A Wasting disease (syndrome) due to underlying condition: Secondary | ICD-10-CM

## 2018-01-21 DIAGNOSIS — Z91148 Patient's other noncompliance with medication regimen for other reason: Secondary | ICD-10-CM

## 2018-01-21 DIAGNOSIS — D638 Anemia in other chronic diseases classified elsewhere: Secondary | ICD-10-CM | POA: Diagnosis not present

## 2018-01-21 DIAGNOSIS — I420 Dilated cardiomyopathy: Secondary | ICD-10-CM | POA: Diagnosis not present

## 2018-01-21 DIAGNOSIS — Z9114 Patient's other noncompliance with medication regimen: Secondary | ICD-10-CM | POA: Diagnosis not present

## 2018-01-21 DIAGNOSIS — R64 Cachexia: Secondary | ICD-10-CM | POA: Diagnosis not present

## 2018-01-21 DIAGNOSIS — E43 Unspecified severe protein-calorie malnutrition: Secondary | ICD-10-CM | POA: Diagnosis not present

## 2018-01-21 DIAGNOSIS — I10 Essential (primary) hypertension: Secondary | ICD-10-CM

## 2018-01-21 NOTE — Progress Notes (Signed)
Patient ID: Kenneth Rivas, male   DOB: 1961-06-23, 57 y.o.   MRN: [id number]   Provider:  DR Elmon Kirschner Location:  [facility] Room Number: 104 B Place of Service:  SNF ([phone])  PCP: Daiva Eves, Lisette Grinder, MD Patient Care Team: Daiva Eves, Lisette Grinder, MD as PCP - General (Infectious Diseases) Daiva Eves, Lisette Grinder, MD as PCP - Infectious Diseases (Infectious Diseases) Pricilla Riffle, MD as PCP - Cardiology (Cardiology)  Extended Emergency Contact Information Primary Emergency Contact: Moore,Theresa Address: [address]          [place], [place] [address] [place] Home Phone: [phone] Relation: Sister  Code Status: DNR  Goals of Care: Advanced Directive information Advanced Directives 01/21/2018  Does Patient Have a Medical Advance Directive? Yes  Type of Advance Directive Out of facility DNR (pink MOST or yellow form)  Does patient want to make changes to medical advance directive? No - Patient declined  Would patient like information on creating a medical advance directive? -  Pre-existing out of facility DNR order (yellow form or pink MOST form) Pink MOST form placed in chart (order not valid for inpatient use);Yellow form placed in chart (order not valid for inpatient use)      Chief Complaint  Patient presents with  . New Admit To SNF    Admission    HPI: Patient is a 57 y.o. male seen today for admission to SNF following hospital stay for AIDS (CD4 count <10), severe persistent cryptococcal meningoencephalitis, AKI, septic shock, severe protein calorie malnutrition, prediabetes, hx noncompliance with HAART tx, polysubstance abuse (cocaine, tobacco, Etoh). He presented to the ED with change in MS.he was deemed without capacity by psych 11/26/17. He was supposed to go to SNF but went home with his roommate and stopped his meds after d/c. He was intubated this admission->extubated 01/06/18. He was tx with IV ampho and 5FU-->fluconazole (will need  to take x 8 weeks). He rec'd 1 unit PRBCs transfusion. Mg 1.6; Hgb dropped 7.1-->8.4 post transfusion; Cr peaked 1.55-->0.96; A1c 5.7% at d/c. He presents to SNF for short term rehab with potential for long term care.  Today he reports weakness and decreased appetite. Tolerating PT. He is currently 2 person assist and very weak truncal muscles.no nursing issues. He gets nutritional supplements including medpass and ensure. He is a poor historian due to memory loss. Hx obtained from chart.  Anemia of chronic disease (HIV)- stable. Hgb 8.4 posttransfusion of 1 units prbcs  Cachexia associated with AIDS/ severe protein calorie malnutrition - current weight is 102 pounds; albumin is 2.3. He gets ensure three times daily for nutritional supplement; megace 400 mg daily before lunch to help with appetite  AIDS/ noncompliance with HAART - CD4 T cell <10 (11-28-16-9);  Takes diflucan 400 mg daily for 56 more days---> 200 mg daily; zithromax 1200 mg weekly; septra ds three times weekly. He has cachexia and severe protein calorie malnutrition and takes megace. 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 He is currently on po diflucan and will need to complete 8 weeks of med at high dose --> 200mg  daily. Followed by ID    Past Medical History:  Diagnosis Date  . Acute renal insufficiency 01/05/2018  . AIDS ([facility])   .  Anemia   . Anxiety   . Cellulitis of right hand 06/23/2017  . Hepatitis B   . HIV disease ([facility])   . Hypertension   . Immune deficiency disorder [facility])    Past Surgical History:  Procedure Laterality Date  . HERNIA REPAIR      reports that he has been smoking cigarettes.  He has been smoking about 0.30 packs per day. he has never used smokeless tobacco. He reports that he drinks about 2.0 oz  of alcohol per week. He reports that he uses drugs. Drug: Cocaine. Social History   Socioeconomic History  . Marital status: Married    Spouse name: Not on file  . Number of children: Not on file  . Years of education: Not on file  . Highest education level: Not on file  Social Needs  . Financial resource strain: Not on file  . Food insecurity - worry: Not on file  . Food insecurity - inability: Not on file  . Transportation needs - medical: Not on file  . Transportation needs - non-medical: Not on file  Occupational History  . Not on file  Tobacco Use  . Smoking status: Current Some Day Smoker    Packs/day: 0.30    Types: Cigarettes  . Smokeless tobacco: Never Used  . Tobacco comment: pt. given quit line information  Substance and Sexual Activity  . Alcohol use: Yes    Alcohol/week: 2.0 oz    Types: 4 Standard drinks or equivalent per week    Comment: beer  . Drug use: Yes    Types: Cocaine    Comment: crack- last use 03/2012. no h/o IVDU, other illicits  . Sexual activity: Yes    Partners: Male    Comment: patient declined condoms  Other Topics Concern  . Not on file  Social History Narrative  . Not on file    Functional Status Survey:    Family History  Problem Relation Age of Onset  . Hypertension Mother     Health Maintenance  Topic Date Due  . COLONOSCOPY  01/19/2019 (Originally 05/29/2009)  . TETANUS/TDAP  04/30/2022  . INFLUENZA VACCINE  Completed  . Hepatitis C Screening  Completed  . HIV Screening  Completed    No Known Allergies  Outpatient Encounter Medications as of 01/21/2018  Medication Sig  . acetaminophen (TYLENOL) 325 MG tablet Take 2 tablets (650 mg total) by mouth every 4 (four) hours as needed for mild pain (temp > 101.5).  Marland Kitchen azithromycin (ZITHROMAX) 600 MG tablet Take 2 tablets (1,200 mg total) by mouth once a week.  . ENSURE (ENSURE) Take 240 mLs by mouth 3 (three) times daily between meals.  . fluconazole (DIFLUCAN) 200 MG tablet  Take 2 tablets (400 mg total) by mouth daily. After 8 Weeks change to po 200 mg Daily  . ibuprofen (ADVIL,MOTRIN) 200 MG tablet Take 400 mg by mouth every 6 (six) hours as needed for moderate pain.   . megestrol (MEGACE) 400 MG/10ML suspension Take 10 mLs (400 mg total) by mouth daily.  . metoprolol tartrate (LOPRESSOR) 25 MG tablet Take 1 tablet (25 mg total) by mouth 2 (two) times daily.  . Multiple Vitamin (MULTIVITAMIN WITH MINERALS) TABS tablet Take 1 tablet by mouth daily.  . Nutritional Supplements (NUTRITIONAL SUPPLEMENT PO) NAS (No added Salt) diet -  Mechanical Soft Texture, thin Liquids consistency  . pantoprazole (PROTONIX) 40 MG tablet Take 1 tablet (40 mg total) by mouth daily.  . polyvinyl alcohol (LIQUIFILM TEARS)  1.4 % ophthalmic solution Place 1 drop into both eyes at bedtime.  . sulfamethoxazole-trimethoprim (BACTRIM DS,SEPTRA DS) 800-160 MG tablet Take 1 tablet by mouth 3 (three) times a week.   No facility-administered encounter medications on file as of 01/21/2018.     Review of Systems  Unable to perform ROS: Other (memory loss)    Vitals:   01/21/18 0958  BP: 140/78  Pulse: 85  Resp: 18  Temp: 98.9 F (37.2 C)  SpO2: 94%  Weight: 101 lb 3.2 oz (45.9 kg)  Height: 5\' 6"  (1.676 m)   Body mass index is 16.33 kg/m. Physical Exam  Constitutional: He appears cachectic. No distress.  Lying in bed in NAD, frail appearing  HENT:  Mouth/Throat: Oropharynx is clear and moist.  Temporal wasting; MMM; no oral thrush  Eyes: Pupils are equal, round, and reactive to light. No scleral icterus.  Neck: Neck supple. Carotid bruit is not present. No thyromegaly present.  Cardiovascular: Normal rate, regular rhythm and intact distal pulses. Exam reveals no gallop and no friction rub.  Murmur (1/6 SEM) heard. No distal LE edema. No calf TTP  Pulmonary/Chest: Effort normal and breath sounds normal. He has no wheezes. He has no rales. He exhibits no tenderness.  Abdominal: Soft.  Normal appearance and bowel sounds are normal. He exhibits no distension, no abdominal bruit, no pulsatile midline mass and no mass. There is no hepatomegaly. There is no tenderness. There is no rigidity, no rebound and no guarding. No hernia.  Lymphadenopathy:    He has no cervical adenopathy.  Neurological: He is alert.  Skin: Skin is warm and dry. No rash noted.  Skin very dry  Psychiatric: He has a normal mood and affect. His behavior is normal. Thought content normal.    Labs reviewed: Basic Metabolic Panel: Recent Labs    01/06/18 0500  01/13/18 0647 01/14/18 0338 01/15/18 0858  NA 146*   < > 134* 134* 134*  K 4.2   < > 5.1 5.1 4.7  CL 107   < > 114* 114* 112*  CO2 26   < > 14* 15* 15*  GLUCOSE 118*   < > 88 90 118*  BUN 107*   < > 34* 30* 26*  CREATININE 4.47*   < > 1.29* 1.05 0.96  CALCIUM 6.1*   < > 8.2* 8.3* 8.4*  MG 1.8   < > 1.5* 1.7 1.6*  PHOS 4.7*  --   --  3.8 3.5   < > = values in this interval not displayed.   Liver Function Tests: Recent Labs    01/10/18 0819 01/14/18 0338 01/15/18 0858  AST 84* 36 35  ALT 217* 81* 63  ALKPHOS 152* 236* 252*  BILITOT 1.1 0.5 0.5  PROT 7.5 7.3 6.9  ALBUMIN 2.3* 2.3* 2.3*   Recent Labs    06/11/17 1245  LIPASE 27   No results for input(s): AMMONIA in the last 8760 hours. CBC: Recent Labs    01/04/18 1637  01/13/18 0647 01/14/18 0338 01/15/18 0858  WBC 10.3   < > 5.4 5.3 4.6  NEUTROABS 8.5*  --   --  3.6 3.4  HGB 12.6*   < > 7.1* 8.9* 8.4*  HCT 38.3*   < > 21.1* 26.8* 26.2*  MCV 84.5   < > 82.4 84.3 86.8  PLT 321   < > 72* 92* 126*   < > = values in this interval not displayed.   Cardiac Enzymes: Recent Labs  06/16/17 1915  11/27/17 1608 11/27/17 2205 01/04/18 1637  CKTOTAL 35*  --   --   --   --   TROPONINI  --    < > <0.03 <0.03 0.05*   < > = values in this interval not displayed.   BNP: Invalid input(s): POCBNP Lab Results  Component Value Date   HGBA1C 5.7 (H) 01/14/2018   Lab  Results  Component Value Date   TSH 1.209 11/29/2017   Lab Results  Component Value Date   VITAMINB12 781 11/28/2017   Lab Results  Component Value Date   FOLATE 9.0 11/28/2017   Lab Results  Component Value Date   IRON 52 11/28/2017   TIBC 216 (L) 11/28/2017   FERRITIN 984 (H) 11/28/2017    Imaging and Procedures obtained prior to SNF admission: Dg Abd 1 View  Result Date: 01/06/2018 CLINICAL DATA:  OG tube placement. EXAM: ABDOMEN - 1 VIEW COMPARISON:  Radiograph 01/04/2018 FINDINGS: Tip of the enteric tube below the diaphragm in the stomach, side-port in the region of the a gastroesophageal junction, advancement of least 3 cm would lead to optimal placement. Right femoral catheter tip is in the left pelvis directed inferiorly likely in the left iliac vessels. No bowel dilatation to suggest obstruction. Phleboliths in the pelvis. IMPRESSION: 1. Side port of the enteric tube in the region of the gastroesophageal junction, recommend advancement of at least 3 cm. 2. Right femoral catheter in place, tip to the left of midline directed inferiorly in the region of the left common iliac vessels. Recommend repositioning. Electronically Signed   By: Rubye OaksMelanie  Ehinger M.D.   On: 01/06/2018 04:26   [place] Renal  Result Date: 01/05/2018 CLINICAL DATA:  Acute kidney injury EXAM: RENAL / URINARY TRACT ULTRASOUND COMPLETE COMPARISON:  Overlapping portions from CT chest dated 06/17/2017 FINDINGS: Right Kidney: Length: 10.9 cm. The upper pole the right kidney seems slightly hypoechoic with respect to the lower pole, although some of this may be secondary to poor characterization of the upper pole. No mass or hydronephrosis visualized. No stones are noted. Left Kidney: Length: 9.6 cm. Heterogeneity in parenchymal echogenicity with the lower pole seemingly slightly hypoechoic compared to the upper pole, significance uncertain. No mass or hydronephrosis visualized. Bladder: The bladder is empty and appears  otherwise normal for degree of bladder distention. A Foley catheter is in place. IMPRESSION: 1. Mild heterogeneity and renal parenchymal echogenicity bilaterally as noted above. Possibilities might include glomerular nephritis or pyelonephritis. No definite perirenal fluid collection. Correlate with urine analysis in determining whether further workup is warranted. 2. No stones or hydronephrosis identified. Electronically Signed   By: Gaylyn RongWalter  Liebkemann M.D.   On: 01/05/2018 10:50   Dg Chest Port 1 View  Result Date: 01/06/2018 CLINICAL DATA:  Re-intubation. Acute respiratory failure with hypoxia. EXAM: PORTABLE CHEST 1 VIEW COMPARISON:  Radiograph yesterday at 0432 hour FINDINGS: Endotracheal tube 4.7 cm from the carina. Enteric tube in place with side-port in the region of the gastroesophageal junction. Mild hyperinflation. Mild infrahilar atelectasis. No focal airspace opacity. No pleural fluid or pneumothorax. IMPRESSION: 1. Endotracheal tube tip at the thoracic inlet. Enteric tube in place. 2. Mild infrahilar atelectasis. Electronically Signed   By: Rubye OaksMelanie  Ehinger M.D.   On: 01/06/2018 04:30   Dg Chest Port 1 View  Result Date: 01/05/2018 CLINICAL DATA:  Respiratory failure EXAM: PORTABLE CHEST 1 VIEW COMPARISON:  01/04/2018 FINDINGS: Endotracheal tube is unchanged. NG tube is been placed into the stomach. The side port is  near the GE junction. Lungs are clear. Heart is normal size. No effusions or acute bony abnormality. IMPRESSION: No active disease. Electronically Signed   By: Charlett Nose M.D.   On: 01/05/2018 07:26    Assessment/Plan   ICD-10-CM   1. Physical deconditioning R53.81   2. Cachexia associated with AIDS (HCC) B20    R64   3. Protein-calorie malnutrition, severe E43   4. Cryptococcal meningoencephalitis (HCC) B45.1   5. AIDS (acquired immune deficiency syndrome) (HCC) B20   6. Dilated cardiomyopathy (HCC) I42.0   7. Essential hypertension I10   8. FTT (failure to thrive)  in adult due to #2,3 R62.7   9. Anemia of chronic disease - s/p 1 unit PRBC D63.8   10.    Noncompliance with medical regimen            Z91.14  Cont current meds as ordered  Reverse isolation due to poor immune status of patient (CD 4 count < 10)  F/u with ID as scheduled  PT/OT/ST as ordered  Cont nutritional supplements per facility protocol  May need palliative care due to frail state  GOAL: short term rehab then long term care potentially. Communicated with pt and nursing.  Will follow  Labs/tests ordered: none    Sherolyn Trettin S. Ancil Linsey  [facility] [address] [place], [place] [address] [phone] Cell (Monday-Friday 8 AM - 5 PM) [phone] After 5 PM and follow prompts

## 2018-01-22 LAB — BASIC METABOLIC PANEL WITH GFR
BUN: 41 — AB (ref 4–21)
Creatinine: 1.6 — AB (ref 0.6–1.3)
Glucose: 84
Potassium: 6.5 — AB (ref 3.4–5.3)
Sodium: 135 — AB (ref 137–147)

## 2018-01-22 LAB — HEPATIC FUNCTION PANEL
ALK PHOS: 355 — AB (ref 25–125)
ALT: 55 — AB (ref 10–40)
AST: 43 — AB (ref 14–40)
BILIRUBIN, TOTAL: 0.2

## 2018-01-22 LAB — CBC AND DIFFERENTIAL
HCT: 27 — AB (ref 41–53)
Hemoglobin: 8.8 — AB (ref 13.5–17.5)
NEUTROS ABS: 2
PLATELETS: 203 (ref 150–399)
WBC: 2.7

## 2018-02-03 ENCOUNTER — Non-Acute Institutional Stay (SKILLED_NURSING_FACILITY): Payer: Medicaid Other | Admitting: Adult Health

## 2018-02-03 ENCOUNTER — Encounter: Payer: Self-pay | Admitting: Adult Health

## 2018-02-03 DIAGNOSIS — R000 Tachycardia, unspecified: Secondary | ICD-10-CM | POA: Diagnosis not present

## 2018-02-03 DIAGNOSIS — E860 Dehydration: Secondary | ICD-10-CM | POA: Diagnosis not present

## 2018-02-03 LAB — BASIC METABOLIC PANEL
BUN: 48 — AB (ref 4–21)
Creatinine: 1.5 — AB (ref 0.6–1.3)
Glucose: 105
POTASSIUM: 5.4 — AB (ref 3.4–5.3)
Sodium: 134 — AB (ref 137–147)

## 2018-02-03 LAB — CBC AND DIFFERENTIAL
HEMATOCRIT: 28 — AB (ref 41–53)
HEMOGLOBIN: 9.2 — AB (ref 13.5–17.5)
Neutrophils Absolute: 4
PLATELETS: 172 (ref 150–399)
WBC: 5

## 2018-02-03 LAB — HEPATIC FUNCTION PANEL
ALK PHOS: 218 — AB (ref 25–125)
ALT: 21 (ref 10–40)
AST: 20 (ref 14–40)
Bilirubin, Total: 0.2

## 2018-02-03 NOTE — Progress Notes (Signed)
Location:   [facility] Room Number: [place] of Service:  SNF (31)   CODE STATUS: DNR (Most Form updated 01/20/18)  No Known Allergies  Chief Complaint  Patient presents with  . Acute Visit    Tachycardia    HPI:  Staff reports that he has an apical pulse of 125. He denies any chest pain; palpitations; or shortness of breath. There are no reports of fever present. The staff reports that this is new for him and he has not had this before.    Past Medical History:  Diagnosis Date  . Acute renal insufficiency 01/05/2018  . AIDS ([place])   . Anemia   . Anxiety   . Cellulitis of right hand 06/23/2017  . Hepatitis B   . HIV disease ([place])   . Hypertension   . Immune deficiency disorder [facility])     Past Surgical History:  Procedure Laterality Date  . HERNIA REPAIR      Social History   Socioeconomic History  . Marital status: Married    Spouse name: Not on file  . Number of children: Not on file  . Years of education: Not on file  . Highest education level: Not on file  Social Needs  . Financial resource strain: Not on file  . Food insecurity - worry: Not on file  . Food insecurity - inability: Not on file  . Transportation needs - medical: Not on file  . Transportation needs - non-medical: Not on file  Occupational History  . Not on file  Tobacco Use  . Smoking status: Current Some Day Smoker    Packs/day: 0.30    Types: Cigarettes  . Smokeless tobacco: Never Used  . Tobacco comment: pt. given quit line information  Substance and Sexual Activity  . Alcohol use: Yes    Alcohol/week: 2.0 oz    Types: 4 Standard drinks or equivalent per week    Comment: beer  . Drug use: Yes    Types: Cocaine    Comment: crack- last use 03/2012. no h/o IVDU, other illicits  . Sexual activity: Yes    Partners: Male    Comment: patient declined condoms  Other Topics Concern  . Not on file  Social History Narrative  . Not on file   Family History  Problem  Relation Age of Onset  . Hypertension Mother       VITAL SIGNS BP 138/74   Pulse 125  Temp 98.9 F (37.2 C)   Resp 18   Ht '5\' 6"'$  (1.676 m)   Wt 101 lb 3.2 oz (45.9 kg)   SpO2 94%   BMI 16.33 kg/m   Outpatient Encounter Medications as of 02/03/2018  Medication Sig  . acetaminophen (TYLENOL) 325 MG tablet Take 2 tablets (650 mg total) by mouth every 4 (four) hours as needed for mild pain (temp > 101.5).  Marland Kitchen azithromycin (ZITHROMAX) 600 MG tablet Take 1,200 mg by mouth daily. Every Monday  . ENSURE (ENSURE) Take 240 mLs by mouth 3 (three) times daily between meals.  . fluconazole (DIFLUCAN) 200 MG tablet Take 2 tablets (400 mg total) by mouth daily. After 8 Weeks change to po 200 mg Daily  . ibuprofen (ADVIL,MOTRIN) 200 MG tablet Take 400 mg by mouth every 6 (six) hours as needed for moderate pain.   . megestrol (MEGACE) 400 MG/10ML suspension Take 10 mLs (400 mg total) by mouth daily.  . metoprolol tartrate (LOPRESSOR) 25 MG tablet Take 1 tablet (25  mg total) by mouth 2 (two) times daily.  . Multiple Vitamin (MULTIVITAMIN WITH MINERALS) TABS tablet Take 1 tablet by mouth daily.  . Nutritional Supplements (NUTRITIONAL SUPPLEMENT PO) NAS (No added Salt) diet -  Mechanical Soft Texture, thin Liquids consistency  . pantoprazole (PROTONIX) 40 MG tablet Take 1 tablet (40 mg total) by mouth daily.  . polyvinyl alcohol (LIQUIFILM TEARS) 1.4 % ophthalmic solution Place 1 drop into both eyes at bedtime.  . sulfamethoxazole-trimethoprim (BACTRIM DS,SEPTRA DS) 800-160 MG tablet Take 1 tablet by mouth 3 (three) times a week.   No facility-administered encounter medications on file as of 02/03/2018.      SIGNIFICANT DIAGNOSTIC EXAMS  PREVIOUS:  01-04-18: chest x-ray: Satisfactory endotracheal tube position with the tip 5.4 cm above the carina. Hyperinflated lungs.  01-04-18: ct of head: Chronic brain changes as described above. No acute findings   01-05-18: EEG: This is an essentially normal  EEG in the awake and drowsy states.  Suppression of voltages can be an indicator of diffuse cortical dysfunction , as can be seen with fungal meningitis.     01-05-18: renal ultrasound: 1. Mild heterogeneity and renal parenchymal echogenicity bilaterally as noted above. Possibilities might include glomerular nephritis or pyelonephritis. No definite perirenal fluid collection. Correlate with urine analysis in determining whether further workup is warranted. 2. No stones or hydronephrosis identified.  01-07-18: chest x-ray:1.  Interim extubation removal of NG tube. 2.  No acute pulmonary disease identified.   NO NEW EXAMS   LABS REVIEWED: PREVIOUS:   01-04-18: wbc 10.3; hgb 12.6; hct 38.3; mcv 81.5; plt 321; glucose 351; bun 122; creat 5.33; k+ 5.1; na++ 148; ca 8.3; mag 3.5; phos 9.6; urine culture: multiple species  01-05-18: wbc 7.9; hgb 8.3; hct 24.7 mcv 86.7; plt 168; glucose 100; bun 132; creat 5.14; k+ 5.2; na++ 148; ca 7.8  01-06-18: wbc 5.2; hgb 6.4; hct 18.5; mcv 80.8; plt 139; glucose 118; bun 107;creat 4.47; k+ 4.2; na++ 146; ca 6.1; ast 759; alt 503; alk phos 83; albumin 1.8; urine culture no growth; hepatitis panel: neg; Crytop Ag: +; titer> 2560 01-07-18: wbc 5.9; hgb 9.2; hct 28.8; mcv 81.2; plt 125; glucose 92; bun 88; creat 3.39; k+ 2.9; na++ 152; ca 7.9; mag 1.8  01-08-18: wbc 3.9; hgb 10.1; hct 32.9 ;mcv 85.0; plt 63; glucose 146; bun 45; creat 2.22; k+ 4.5; na++ 136; ca 7.7; ast 290; alt 432; alk phos 114; albumin 2.1 01-10-18: glucose 120; bun 28; creat 1.19; k+ 4.4; na++ 128; ca 8.0; ast 94; alt 217; alk phos 152 albumin 2.3; mag 1.6 01-12-18: wbc 5.9; hgb 8.4; hct 25.2; mcv 80.5; plt 45; glucose 88; bun 34; creat 1.55; k+ 5.0; na++ 130; ca 8.4; mag 1.8 01-14-18: wbc 5.3; hgb 8.9; hct 26.8; mcv 84.3; plt 92; creat 90; bun 30; creat 1.05; k+ 5.1; na++ 134; ca 8.3; ast 86; alt 81; alk phos 236; albumin 2.3; mag 1.7 01-15-18: wbc 4.6; hgb 8.4; hct 26.2; mcv 86.8; plt 126; glucose 118; bun  26; creat 0.96; k+ 4.7; na++ 134; ca 8.4; alk phos 252; albumin 2.3; mag 1.6; phos 3.5   NO NEW LABS.     Review of Systems  Constitutional: Negative for malaise/fatigue.  Respiratory: Negative for cough and shortness of breath.   Cardiovascular: Negative for chest pain, palpitations and leg swelling.  Gastrointestinal: Negative for abdominal pain, constipation and heartburn.  Musculoskeletal: Negative for back pain, joint pain and myalgias.  Skin: Negative.   Neurological: Negative for  dizziness.  Psychiatric/Behavioral: The patient is not nervous/anxious.      Physical Exam  Constitutional: He is oriented to person, place, and time. No distress.  Frail Muscle wasting present   HENT:  Oral mucosa is dry    Neck: No thyromegaly present.  Cardiovascular: Regular rhythm and intact distal pulses.  Murmur heard. 1/6 Tachycardia   Pulmonary/Chest: Effort normal and breath sounds normal. No respiratory distress.  Abdominal: Soft. Bowel sounds are normal. He exhibits no distension. There is no tenderness.  Musculoskeletal: Normal range of motion. He exhibits no edema.  Lymphadenopathy:    He has no cervical adenopathy.  Neurological: He is alert and oriented to person, place, and time.  Skin: Skin is warm and dry. He is not diaphoretic.  Poor skin turgor  Psychiatric: He has a normal mood and affect.     ASSESSMENT/ PLAN:  TODAY:   1.  Tachycardia: will begin NS at 100 cc per hour for 2 liters; will get cbc; cmp; and ekg; will monitor    MD is aware of resident's narcotic use and is in agreement with current plan of care. We will attempt to wean resident as apropriate    Ok Edwards NP [facility]  Contact [phone] Monday through Friday 8am- 5pm  After hours call [phone]

## 2018-02-05 LAB — BASIC METABOLIC PANEL
BUN: 51 — AB (ref 4–21)
CREATININE: 1.6 — AB (ref 0.6–1.3)
Glucose: 95
POTASSIUM: 5.1 (ref 3.4–5.3)
Sodium: 132 — AB (ref 137–147)

## 2018-02-05 LAB — FUNGUS CULTURE WITH STAIN

## 2018-02-05 LAB — FUNGAL ORGANISM REFLEX

## 2018-02-05 LAB — FUNGUS CULTURE RESULT

## 2018-02-10 ENCOUNTER — Ambulatory Visit (INDEPENDENT_AMBULATORY_CARE_PROVIDER_SITE_OTHER): Payer: Medicaid Other | Admitting: Infectious Disease

## 2018-02-10 ENCOUNTER — Encounter: Payer: Self-pay | Admitting: Infectious Disease

## 2018-02-10 VITALS — BP 128/97 | HR 125 | Temp 97.5°F

## 2018-02-10 DIAGNOSIS — E875 Hyperkalemia: Secondary | ICD-10-CM

## 2018-02-10 DIAGNOSIS — R64 Cachexia: Secondary | ICD-10-CM | POA: Diagnosis not present

## 2018-02-10 DIAGNOSIS — F331 Major depressive disorder, recurrent, moderate: Secondary | ICD-10-CM

## 2018-02-10 DIAGNOSIS — B451 Cerebral cryptococcosis: Secondary | ICD-10-CM

## 2018-02-10 DIAGNOSIS — F0280 Dementia in other diseases classified elsewhere without behavioral disturbance: Secondary | ICD-10-CM | POA: Diagnosis not present

## 2018-02-10 DIAGNOSIS — N289 Disorder of kidney and ureter, unspecified: Secondary | ICD-10-CM | POA: Diagnosis not present

## 2018-02-10 DIAGNOSIS — I420 Dilated cardiomyopathy: Secondary | ICD-10-CM

## 2018-02-10 DIAGNOSIS — B20 Human immunodeficiency virus [HIV] disease: Secondary | ICD-10-CM | POA: Diagnosis present

## 2018-02-10 NOTE — Progress Notes (Signed)
Subjective:   Chief complaint: Follow-up for HIV disease complaining of weakness   Patient ID: Kenneth Rivas, male    DOB: Mar 25, 1961, 57 y.o.   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  He is recently been admitted to the hospital and was retreated with IV amphotericin and for a tight sizing followed by validation with fluconazole 400 mg daily.  His antiretrovirals have been continued to be withheld due to concerns about the risk of immune reconstitution inflammatory syndrome.  Plan is for him to complete his 8 weeks of consolidation therapy with fluconazole and then initiate antiretroviral therapy.  He currently is residing in a skilled nursing facility.  He has had problems with hyperkalemia and chronic kidney disease he also has had trouble with tachycardia recently.  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  On exam today he is clearly still cachectic.  He is fairly slow to respond to my questions but does interact and claims his vision has improved.  When asked him how well he was able to get around at the nursing facility he tells me that someone is always have up to help him.  I explained that we did not want to start antiretroviral therapy yet due to our concerns about the risk of immune reconstitution syndrome.  He says he has no problems with headaches at present and no complaints   Review of Systems  Unable to perform ROS: Dementia       Objective:   Physical Exam  Constitutional: He appears cachectic.  Cardiovascular: Regular rhythm. Tachycardia present. Exam reveals no gallop and no friction rub.  No murmur heard. Pulmonary/Chest: Effort normal and breath sounds normal. No respiratory distress. He has no wheezes.  Abdominal: Soft.  Bowel sounds are normal. He exhibits no distension.  Musculoskeletal:       Arms: Neurological: He is alert.  Psychiatric: His speech is delayed. He is slowed. Cognition and memory are impaired. He exhibits a depressed mood.          Assessment & Plan:   Cryptococcal meningoencephalitis: Tinea fluconazole 40 mg daily.  Return to clinic in 1 month's time which point in time we can drop the dose to 200 mg a day and initiate antiretroviral therapy likely with SYMTUZA.  HIV and AIDS: Continue Bactrim for PCP prevention.  Continue to treat his meningoencephalitis continue azithromycin for Mycobacterium avium prevention.  I very much would like to initiate antiretroviral therapy but I think it is prudent to go ahead and delay his antiretroviral therapy until we have completed his consolidated therapy with fluconazole.  At that point time I will initiate him on SYMTUZA to avoid too rapid a drop in his viral load which might coincide with immune reconstitution inflammatory syndrome.  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  I am not entirely optimistic that he will recover capacity to make decisions for himself.  I think he will continue to need to live in a structured living environment.  Depression: Offered to have him meet with her counselor today.  Problems with hyperkalemia: Defer  to primary team at skilled nursing facility labs were checked last week.

## 2018-02-11 ENCOUNTER — Encounter: Payer: Self-pay | Admitting: Adult Health

## 2018-02-11 ENCOUNTER — Non-Acute Institutional Stay (SKILLED_NURSING_FACILITY): Payer: Medicaid Other | Admitting: Adult Health

## 2018-02-11 DIAGNOSIS — L89152 Pressure ulcer of sacral region, stage 2: Secondary | ICD-10-CM | POA: Diagnosis not present

## 2018-02-11 NOTE — Progress Notes (Signed)
Location:   [facility] Room Number: 103 Place of Service:  SNF (31)   CODE STATUS: DNR (Most form updated 01/20/18)  No Known Allergies  Chief Complaint  Patient presents with  . Acute Visit    Wound Care    HPI:  He has a stage II on coccyx without signs of infection present. Is superficial in depth.there are no signs of infection present. His coccyx area is tender to touch. We discussed him spending more time on his sides in order to help relieve pain and pressure. He verbalized understanding. There are no reports of fevers present.    Past Medical History:  Diagnosis Date  . Acute renal insufficiency 01/05/2018  . AIDS ([place])   . Anemia   . Anxiety   . Cellulitis of right hand 06/23/2017  . Hepatitis B   . HIV disease ([place])   . Hypertension   . Immune deficiency disorder [facility])     Past Surgical History:  Procedure Laterality Date  . HERNIA REPAIR      Social History   Socioeconomic History  . Marital status: Married    Spouse name: Not on file  . Number of children: Not on file  . Years of education: Not on file  . Highest education level: Not on file  Occupational History  . Not on file  Social Needs  . Financial resource strain: Not on file  . Food insecurity:    Worry: Not on file    Inability: Not on file  . Transportation needs:    Medical: Not on file    Non-medical: Not on file  Tobacco Use  . Smoking status: Former Smoker    Packs/day: 0.30    Types: Cigarettes  . Smokeless tobacco: Never Used  . Tobacco comment: pt. given quit line information  Substance and Sexual Activity  . Alcohol use: Yes    Alcohol/week: 2.0 oz    Types: 4 Standard drinks or equivalent per week    Comment: beer  . Drug use: Yes    Types: Cocaine    Comment: crack- last use 03/2012. no h/o IVDU, other illicits  . Sexual activity: Yes    Partners: Male    Comment: patient declined condoms  Lifestyle  . Physical activity:    Days per week: Not on  file    Minutes per session: Not on file  . Stress: Not on file  Relationships  . Social connections:    Talks on phone: Not on file    Gets together: Not on file    Attends religious service: Not on file    Active member of club or organization: Not on file    Attends meetings of clubs or organizations: Not on file    Relationship status: Not on file  . Intimate partner violence:    Fear of current or ex partner: Not on file    Emotionally abused: Not on file    Physically abused: Not on file    Forced sexual activity: Not on file  Other Topics Concern  . Not on file  Social History Narrative  . Not on file   Family History  Problem Relation Age of Onset  . Hypertension Mother       VITAL SIGNS BP 136/76   Pulse 72   Temp 98.1 F (36.7 C)   Resp 18   Ht _0  (1.676 m)   Wt 93 lb (42.2 kg)   SpO2 94%  BMI 15.01 kg/m   Outpatient Encounter Medications as of 02/11/2018  Medication Sig  . acetaminophen (TYLENOL) 325 MG tablet Take 2 tablets (650 mg total) by mouth every 4 (four) hours as needed for mild pain (temp > 101.5).  Marland Kitchen azithromycin (ZITHROMAX) 600 MG tablet Take 1,200 mg by mouth daily. Every Monday  . ENSURE (ENSURE) Take 240 mLs by mouth 3 (three) times daily between meals.  . fluconazole (DIFLUCAN) 200 MG tablet Take 2 tablets (400 mg total) by mouth daily. After 8 Weeks change to po 200 mg Daily  . ibuprofen (ADVIL,MOTRIN) 200 MG tablet Take 400 mg by mouth every 6 (six) hours as needed for moderate pain.   . megestrol (MEGACE) 400 MG/10ML suspension Take 10 mLs (400 mg total) by mouth daily.  . metoprolol tartrate (LOPRESSOR) 25 MG tablet Take 1 tablet (25 mg total) by mouth 2 (two) times daily.  . Multiple Vitamin (MULTIVITAMIN WITH MINERALS) TABS tablet Take 1 tablet by mouth daily.  . Nutritional Supplements (NUTRITIONAL SUPPLEMENT PO) Regular Diet -  Mechanical Soft Texture, thin Liquids consistency  . pantoprazole (PROTONIX) 40 MG tablet Take 1  tablet (40 mg total) by mouth daily.  . polyvinyl alcohol (LIQUIFILM TEARS) 1.4 % ophthalmic solution Place 1 drop into both eyes at bedtime.  . sulfamethoxazole-trimethoprim (BACTRIM DS,SEPTRA DS) 800-160 MG tablet Take 1 tablet by mouth 3 (three) times a week.   No facility-administered encounter medications on file as of 02/11/2018.      SIGNIFICANT DIAGNOSTIC EXAMS  PREVIOUS:  01-04-18: chest x-ray: Satisfactory endotracheal tube position with the tip 5.4 cm above the carina. Hyperinflated lungs.  01-04-18: ct of head: Chronic brain changes as described above. No acute findings   01-05-18: EEG: This is an essentially normal EEG in the awake and drowsy states.  Suppression of voltages can be an indicator of diffuse cortical dysfunction , as can be seen with fungal meningitis.     01-05-18: renal ultrasound: 1. Mild heterogeneity and renal parenchymal echogenicity bilaterally as noted above. Possibilities might include glomerular nephritis or pyelonephritis. No definite perirenal fluid collection. Correlate with urine analysis in determining whether further workup is warranted. 2. No stones or hydronephrosis identified.  01-07-18: chest x-ray:1.  Interim extubation removal of NG tube. 2.  No acute pulmonary disease identified.   NO NEW EXAMS   LABS REVIEWED: PREVIOUS:   01-04-18: wbc 10.3; hgb 12.6; hct 38.3; mcv 81.5; plt 321; glucose 351; bun 122; creat 5.33; k+ 5.1; na++ 148; ca 8.3; mag 3.5; phos 9.6; urine culture: multiple species  01-05-18: wbc 7.9; hgb 8.3; hct 24.7 mcv 86.7; plt 168; glucose 100; bun 132; creat 5.14; k+ 5.2; na++ 148; ca 7.8  01-06-18: wbc 5.2; hgb 6.4; hct 18.5; mcv 80.8; plt 139; glucose 118; bun 107;creat 4.47; k+ 4.2; na++ 146; ca 6.1; ast 759; alt 503; alk phos 83; albumin 1.8; urine culture no growth; hepatitis panel: neg; Crytop Ag: +; titer> 2560 01-07-18: wbc 5.9; hgb 9.2; hct 28.8; mcv 81.2; plt 125; glucose 92; bun 88; creat 3.39; k+ 2.9; na++ 152; ca  7.9; mag 1.8  01-08-18: wbc 3.9; hgb 10.1; hct 32.9 ;mcv 85.0; plt 63; glucose 146; bun 45; creat 2.22; k+ 4.5; na++ 136; ca 7.7; ast 290; alt 432; alk phos 114; albumin 2.1 01-10-18: glucose 120; bun 28; creat 1.19; k+ 4.4; na++ 128; ca 8.0; ast 94; alt 217; alk phos 152 albumin 2.3; mag 1.6 01-12-18: wbc 5.9; hgb 8.4; hct 25.2; mcv 80.5; plt 45; glucose  88; bun 34; creat 1.55; k+ 5.0; na++ 130; ca 8.4; mag 1.8 01-14-18: wbc 5.3; hgb 8.9; hct 26.8; mcv 84.3; plt 92; creat 90; bun 30; creat 1.05; k+ 5.1; na++ 134; ca 8.3; ast 86; alt 81; alk phos 236; albumin 2.3; mag 1.7 01-15-18: wbc 4.6; hgb 8.4; hct 26.2; mcv 86.8; plt 126; glucose 118; bun 26; creat 0.96; k+ 4.7; na++ 134; ca 8.4; alk phos 252; albumin 2.3; mag 1.6; phos 3.5   NO NEW LABS.     Review of Systems  Constitutional: Negative for malaise/fatigue.  Respiratory: Negative for cough and shortness of breath.   Cardiovascular: Negative for chest pain, palpitations and leg swelling.  Gastrointestinal: Negative for abdominal pain, constipation and heartburn.  Musculoskeletal: Negative for back pain, joint pain and myalgias.  Skin:       Has sore on butt   Neurological: Negative for dizziness.  Psychiatric/Behavioral: The patient is not nervous/anxious.     Physical Exam  Constitutional: He is oriented to person, place, and time. No distress.  Cachexia Muscle wasting present   Neck: No thyromegaly present.  Cardiovascular: Normal rate, regular rhythm and intact distal pulses.  Murmur heard. 1/6  Pulmonary/Chest: Effort normal and breath sounds normal. No respiratory distress.  Abdominal: Soft. Bowel sounds are normal. He exhibits no distension. There is no tenderness.  Musculoskeletal: Normal range of motion. He exhibits no edema or deformity.  Lymphadenopathy:    He has no cervical adenopathy.  Neurological: He is alert and oriented to person, place, and time.  Skin: Skin is warm and dry. He is not diaphoretic.  Coccyx stage  II: 5 x 3.5 cm superficial   Psychiatric: He has a normal mood and affect.     ASSESSMENT/ PLAN:  TODAY:   1.  Stage II ulcer coccyx: will use barrier cream with zinc and will monitor his status.    MD is aware of resident's narcotic use and is in agreement with current plan of care. We will attempt to wean resident as apropriate    Ok Edwards NP [facility] Adult Medicine  Contact [phone] Monday through Friday 8am- 5pm  After hours call [phone]

## 2018-02-27 LAB — ACID FAST CULTURE WITH REFLEXED SENSITIVITIES

## 2018-02-27 LAB — ACID FAST CULTURE WITH REFLEXED SENSITIVITIES (MYCOBACTERIA): Acid Fast Culture: NEGATIVE

## 2018-03-17 DEATH — deceased

## 2018-03-29 ENCOUNTER — Ambulatory Visit: Payer: Self-pay | Admitting: Infectious Disease

## 2018-10-21 LAB — FUNGUS CULTURE WITH STAIN

## 2019-10-17 IMAGING — RF DG FLUORO GUIDE LUMBAR PUNCTURE
1 series · 2 of 2 positions shown · non-contrast
Comparison: none

CLINICAL DATA: AIDS.  Evaluate for cryptococcal meningitis.

[Series 1: run · 2 of 2 slices shown]
[im 1/2]
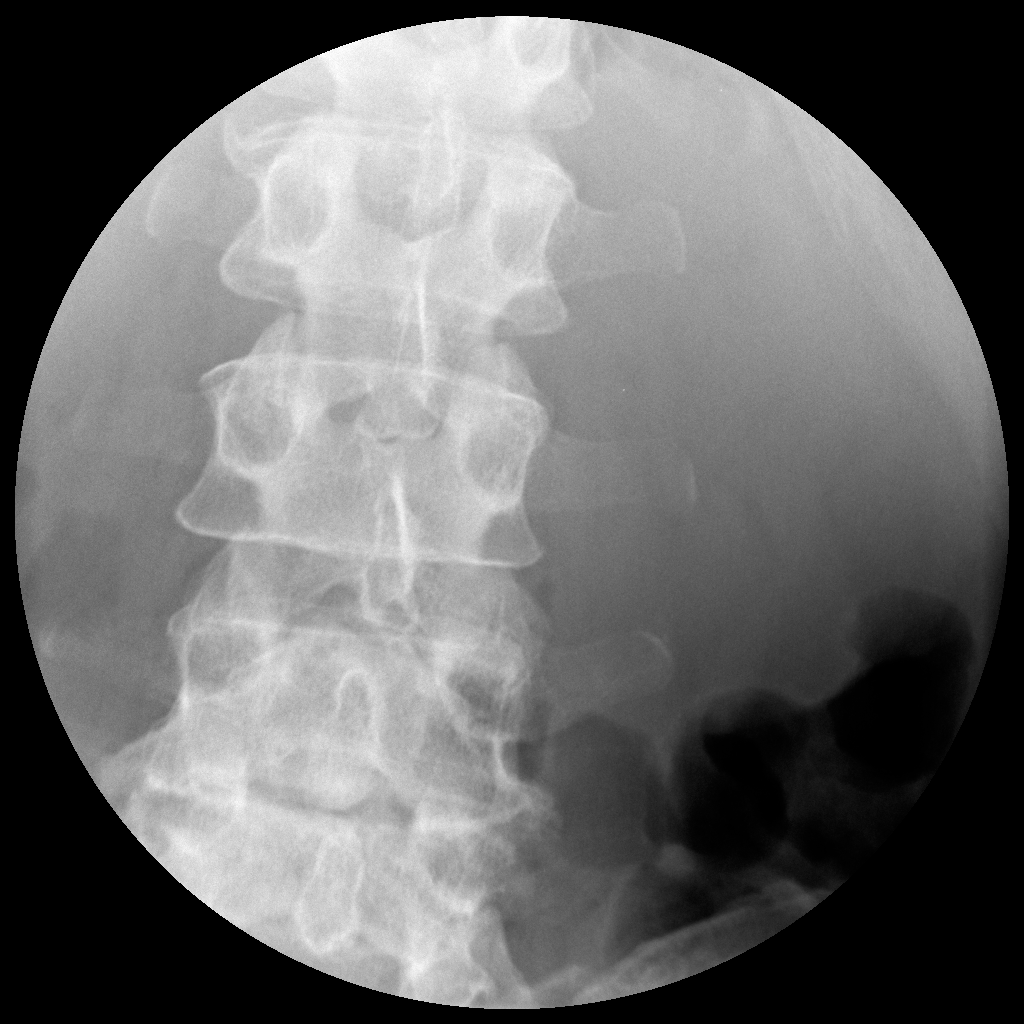
[im 2/2]
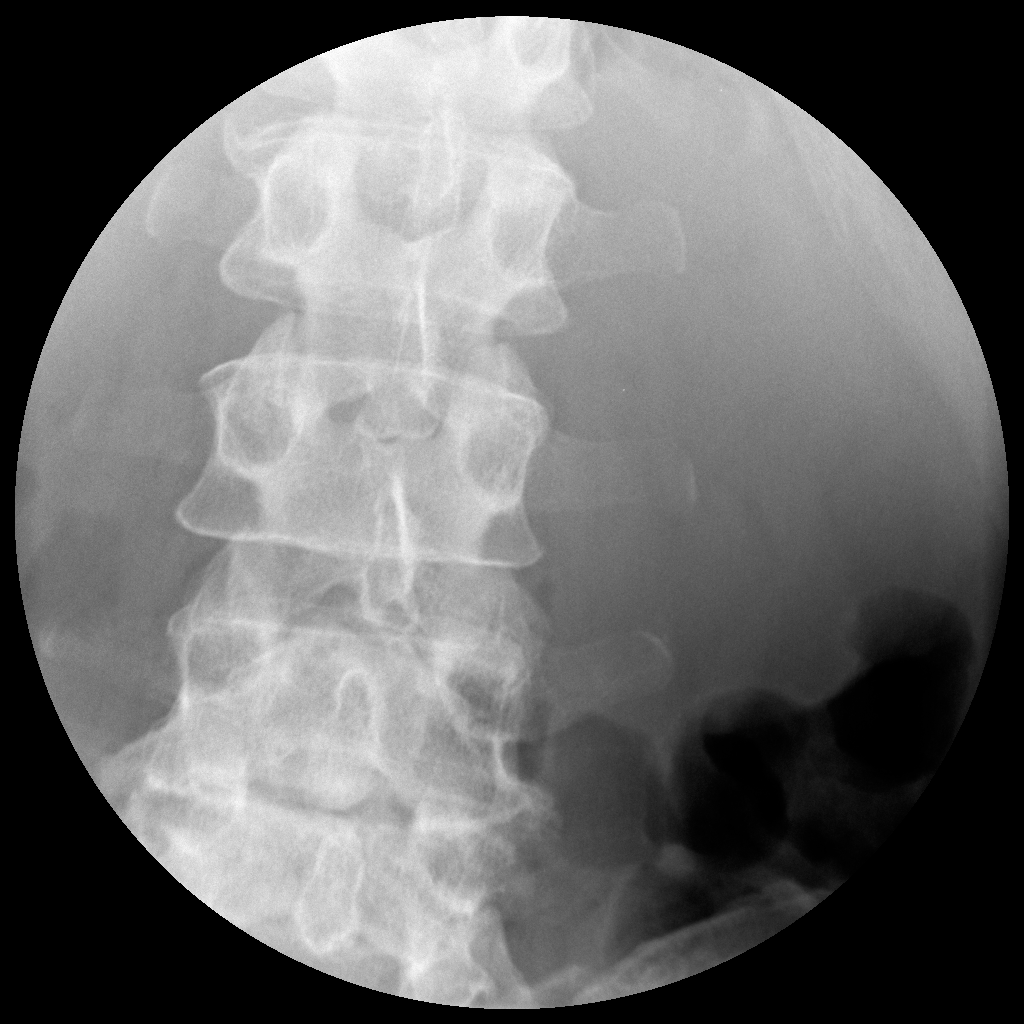

[2 of 2 positions shown; findings below may reference images not displayed]

EXAM:
DIAGNOSTIC LUMBAR PUNCTURE UNDER FLUOROSCOPIC GUIDANCE

FLUOROSCOPY TIME:  Fluoroscopy Time:  1 minute 14 seconds

Radiation Exposure Index (if provided by the fluoroscopic device):
10 mGy

Number of Acquired Spot Images: 1

PROCEDURE:
Informed consent was obtained from the patient prior to the
procedure, including potential complications of headache, allergy,
and pain. With the patient prone, the lower back was prepped with
Betadine. 1% Lidocaine was used for local anesthesia. Lumbar
puncture was performed at the L2-3 level using a 20 gauge needle
with return of clear CSF with an opening pressure of 36 cm water.
9.5 ml of CSF were obtained for laboratory studies. The patient
tolerated the procedure well and there were no apparent
complications.
IMPRESSION: 1. Successful lumbar puncture at L2-3.
2. Opening pressure of 36 cm water.  9.5 cc of CSF collected.

## 2019-11-24 IMAGING — CT CT HEAD W/O CM
3 series · 15 of 46 positions shown, 18 images · non-contrast
Comparison: Head CT 11/26/2017 and MRI brain 06/25/2017

CLINICAL DATA: Altered mental status. HIV positive with history of
cryptococcal meningitis.

EXAM:
CT HEAD WITHOUT CONTRAST
TECHNIQUE: Contiguous axial images were obtained from the base of the skull
through the vertex without intravenous contrast.

[Series 2: head wo · axial · 0.47mm/px · z∈[-67,+53]mm · 9 of 29 slices shown, 12 images]
[im 3/29  brain]
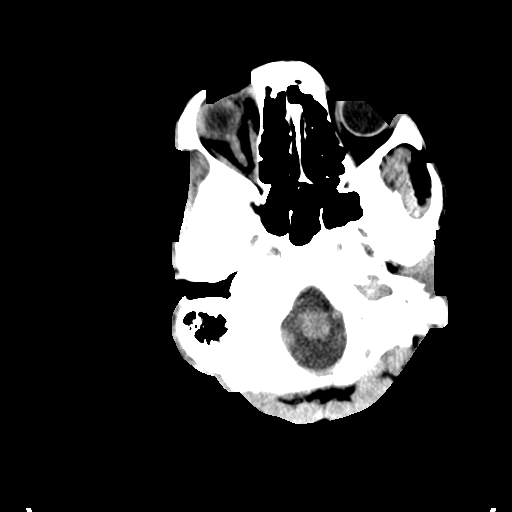
[im 3/29  bone]
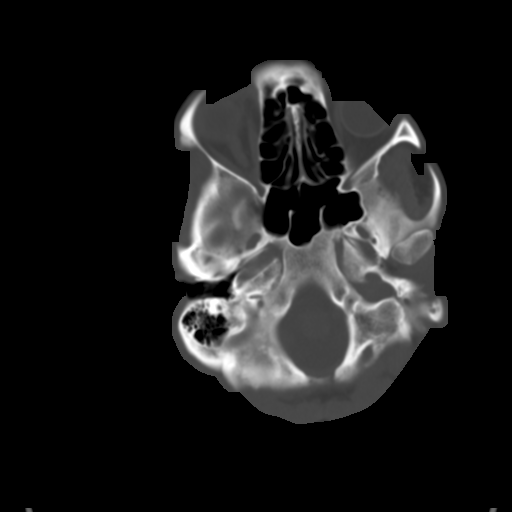
[im 6/29  brain]
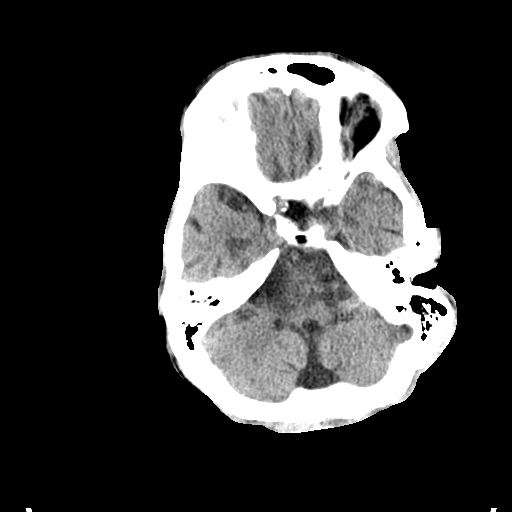
[im 9/29  brain]
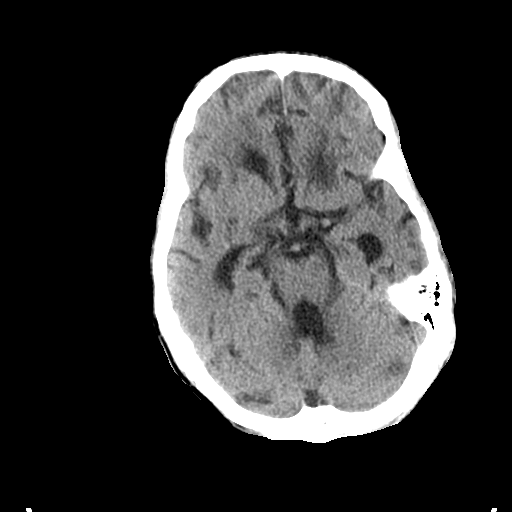
[im 12/29  brain]
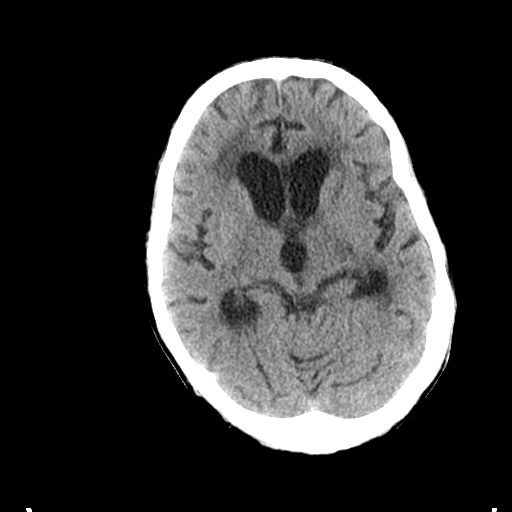
[im 15/29  brain]
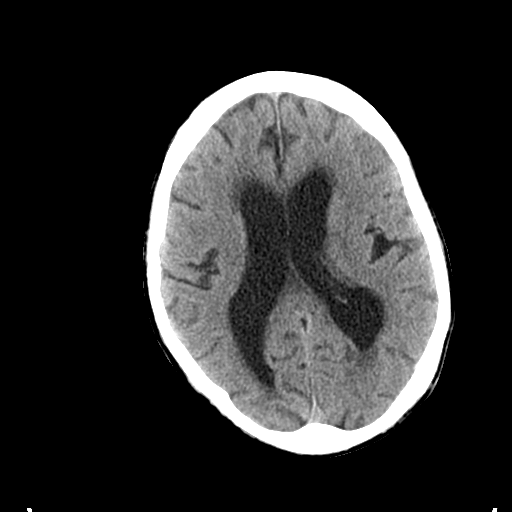
[im 15/29  bone]
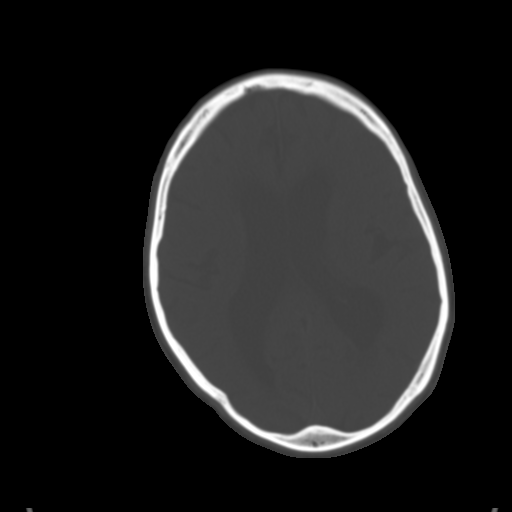
[im 18/29  brain]
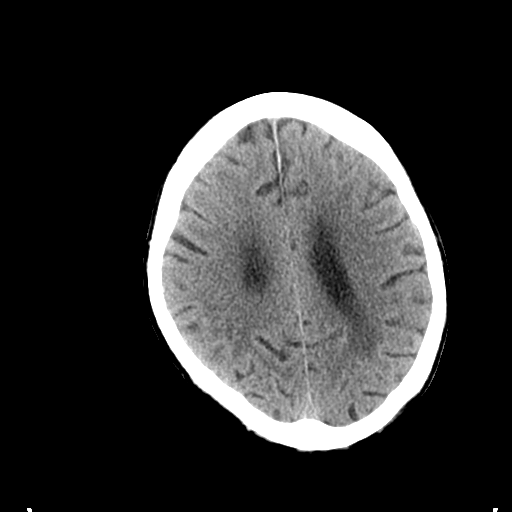
[im 21/29  brain]
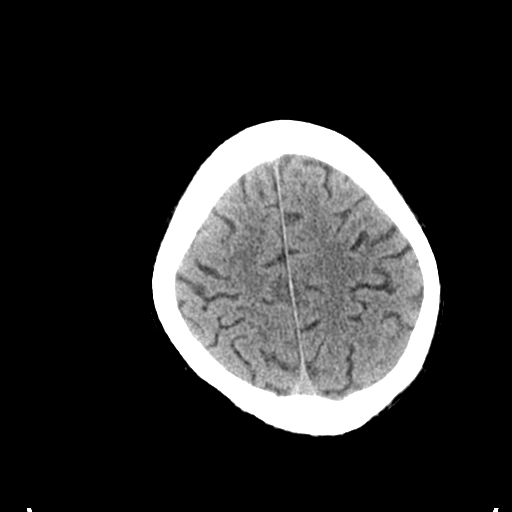
[im 24/29  brain]
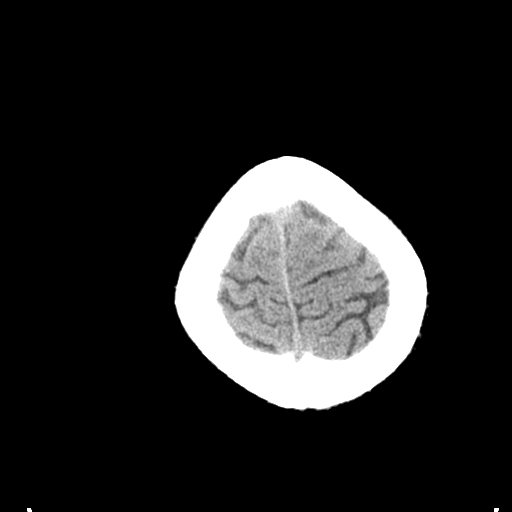
[im 27/29  brain]
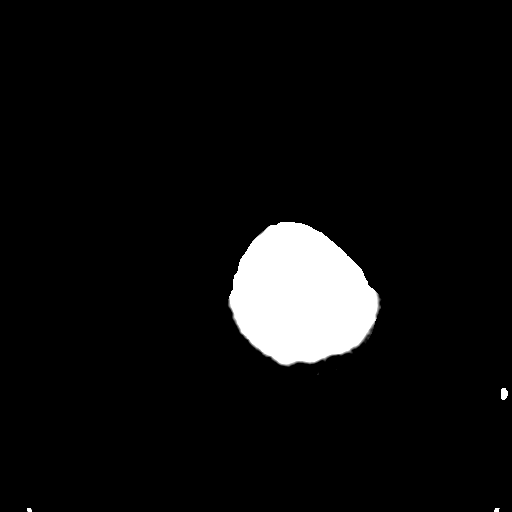
[im 27/29  bone]
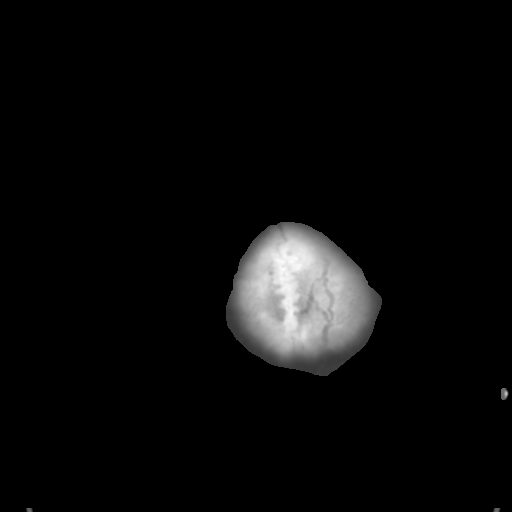

[Series 5: coronal soft tissue · coronal · 0.28mm/px · 3 of 67 slices shown]
[im 23/67  brain]
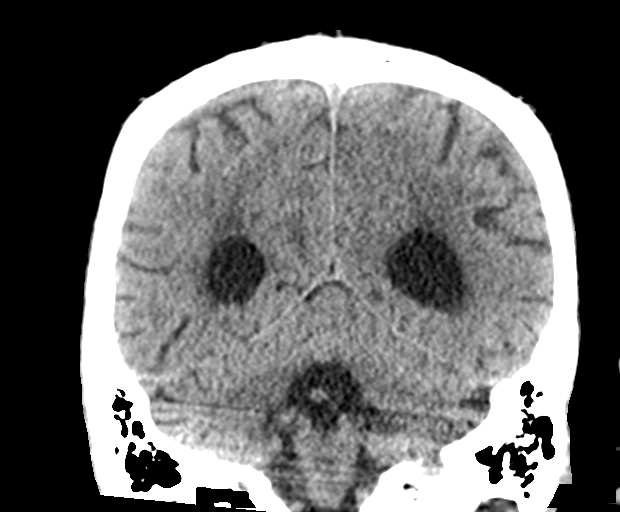
[im 30/67  brain]
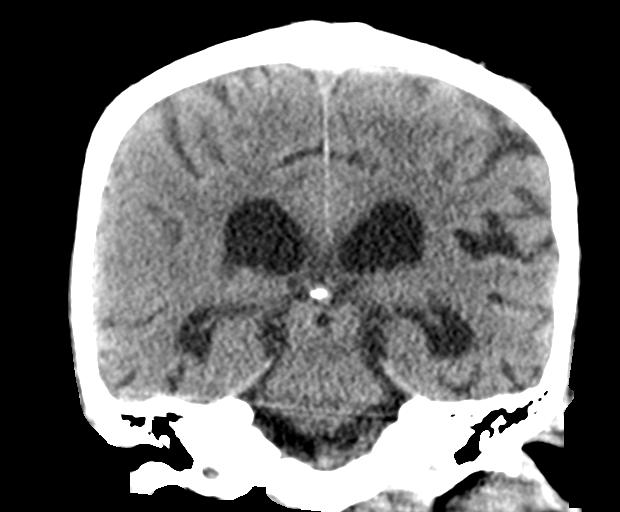
[im 37/67  brain]
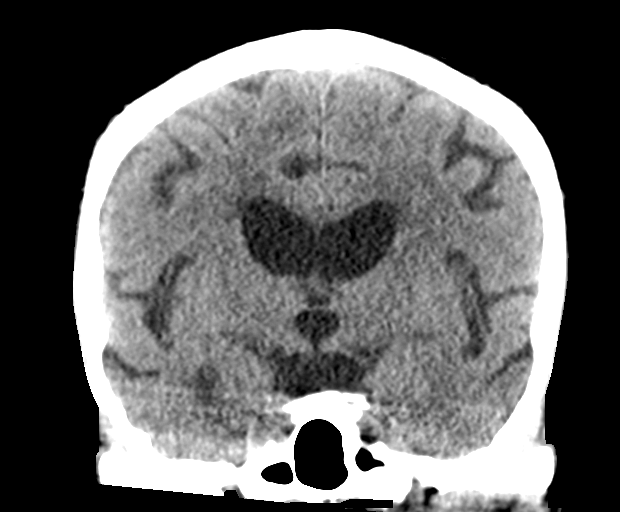

[Series 6: sagittal soft tissue · sagittal · 0.28mm/px · 3 of 57 slices shown]
[im 19/57  brain]
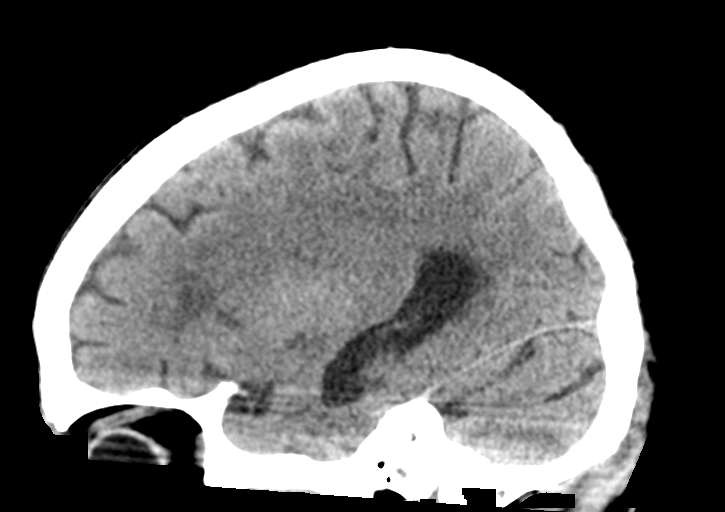
[im 29/57  brain]
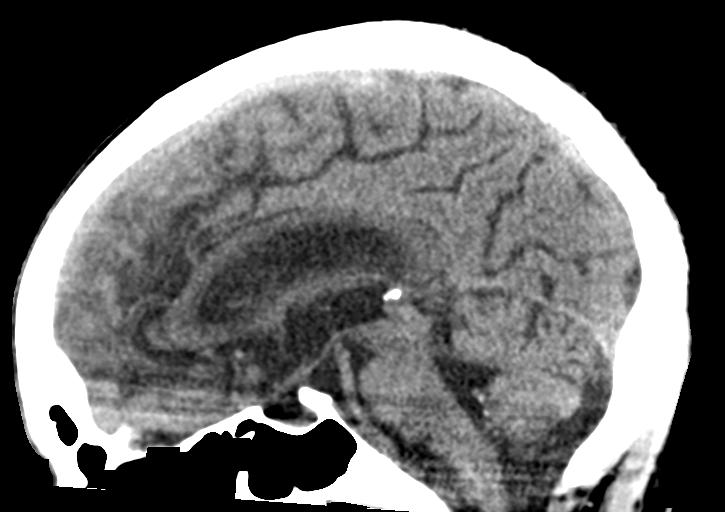
[im 38/57  brain]
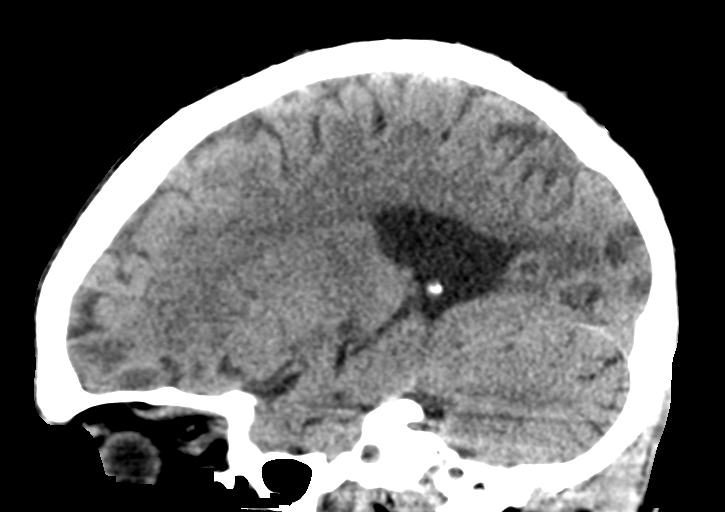

[15 of 46 positions shown; findings below may reference images not displayed]

FINDINGS: Brain: Age advanced cerebral atrophy, ventriculomegaly and
periventricular white matter disease. No acute intracranial findings
or mass lesions. No extra-axial fluid collections.

Vascular: No hyperdense vessels or obvious aneurysm.

Skull: No skull fracture or bone lesion.

Sinuses/Orbits: The paranasal sinuses and mastoid air cells are
clear. The globes are intact.

Other: No scalp lesions or hematoma.
IMPRESSION: Chronic brain changes as described above.  No acute findings.
